# Patient Record
Sex: Female | Born: 1960
Health system: Southern US, Community
[De-identification: ages and names within clinical notes are randomized; demographics above are authoritative.]

## PROBLEM LIST (undated history)

## (undated) DIAGNOSIS — R079 Chest pain, unspecified: Secondary | ICD-10-CM

## (undated) DIAGNOSIS — R233 Spontaneous ecchymoses: Secondary | ICD-10-CM

## (undated) DIAGNOSIS — R238 Other skin changes: Secondary | ICD-10-CM

## (undated) DIAGNOSIS — I639 Cerebral infarction, unspecified: Secondary | ICD-10-CM

## (undated) DIAGNOSIS — M1990 Unspecified osteoarthritis, unspecified site: Secondary | ICD-10-CM

## (undated) DIAGNOSIS — I10 Essential (primary) hypertension: Secondary | ICD-10-CM

## (undated) DIAGNOSIS — F329 Major depressive disorder, single episode, unspecified: Secondary | ICD-10-CM

## (undated) DIAGNOSIS — R531 Weakness: Secondary | ICD-10-CM

## (undated) DIAGNOSIS — E039 Hypothyroidism, unspecified: Secondary | ICD-10-CM

## (undated) DIAGNOSIS — I2510 Atherosclerotic heart disease of native coronary artery without angina pectoris: Secondary | ICD-10-CM

## (undated) DIAGNOSIS — T7840XA Allergy, unspecified, initial encounter: Secondary | ICD-10-CM

## (undated) DIAGNOSIS — G8929 Other chronic pain: Secondary | ICD-10-CM

## (undated) DIAGNOSIS — E559 Vitamin D deficiency, unspecified: Secondary | ICD-10-CM

## (undated) DIAGNOSIS — F32A Depression, unspecified: Secondary | ICD-10-CM

## (undated) DIAGNOSIS — M549 Dorsalgia, unspecified: Secondary | ICD-10-CM

## (undated) DIAGNOSIS — M545 Low back pain, unspecified: Secondary | ICD-10-CM

## (undated) DIAGNOSIS — R0789 Other chest pain: Secondary | ICD-10-CM

## (undated) DIAGNOSIS — R51 Headache: Secondary | ICD-10-CM

## (undated) DIAGNOSIS — I4891 Unspecified atrial fibrillation: Secondary | ICD-10-CM

## (undated) DIAGNOSIS — Z8709 Personal history of other diseases of the respiratory system: Secondary | ICD-10-CM

## (undated) HISTORY — DX: Hypothyroidism, unspecified: E03.9

## (undated) HISTORY — PX: BACK SURGERY: SHX140

## (undated) HISTORY — DX: Dorsalgia, unspecified: M54.9

## (undated) HISTORY — DX: Essential (primary) hypertension: I10

## (undated) HISTORY — DX: Allergy, unspecified, initial encounter: T78.40XA

## (undated) HISTORY — PX: COLONOSCOPY: SHX174

## (undated) HISTORY — DX: Other chest pain: R07.89

## (undated) HISTORY — PX: CARPAL TUNNEL RELEASE: SHX101

## (undated) HISTORY — DX: Chest pain, unspecified: R07.9

---

## 1989-01-11 HISTORY — PX: EXPLORATORY LAPAROTOMY: SUR591

## 1997-06-04 ENCOUNTER — Ambulatory Visit (HOSPITAL_COMMUNITY): Admission: RE | Admit: 1997-06-04 | Discharge: 1997-06-04 | Payer: Self-pay | Admitting: Orthopedic Surgery

## 1998-02-03 ENCOUNTER — Ambulatory Visit (HOSPITAL_BASED_OUTPATIENT_CLINIC_OR_DEPARTMENT_OTHER): Admission: RE | Admit: 1998-02-03 | Discharge: 1998-02-03 | Payer: Self-pay | Admitting: Surgery

## 1998-03-12 ENCOUNTER — Other Ambulatory Visit: Admission: RE | Admit: 1998-03-12 | Discharge: 1998-03-12 | Payer: Self-pay | Admitting: Family Medicine

## 1998-10-02 ENCOUNTER — Encounter: Payer: Self-pay | Admitting: *Deleted

## 1998-10-02 ENCOUNTER — Ambulatory Visit (HOSPITAL_COMMUNITY): Admission: RE | Admit: 1998-10-02 | Discharge: 1998-10-02 | Payer: Self-pay | Admitting: *Deleted

## 2000-08-03 ENCOUNTER — Other Ambulatory Visit: Admission: RE | Admit: 2000-08-03 | Discharge: 2000-08-03 | Payer: Self-pay | Admitting: Gynecology

## 2001-10-30 ENCOUNTER — Other Ambulatory Visit: Admission: RE | Admit: 2001-10-30 | Discharge: 2001-10-30 | Payer: Self-pay | Admitting: Obstetrics and Gynecology

## 2003-04-01 ENCOUNTER — Other Ambulatory Visit: Admission: RE | Admit: 2003-04-01 | Discharge: 2003-04-01 | Payer: Self-pay | Admitting: Obstetrics and Gynecology

## 2005-05-05 ENCOUNTER — Encounter: Admission: RE | Admit: 2005-05-05 | Discharge: 2005-05-05 | Payer: Self-pay | Admitting: Obstetrics and Gynecology

## 2005-05-17 ENCOUNTER — Other Ambulatory Visit: Admission: RE | Admit: 2005-05-17 | Discharge: 2005-05-17 | Payer: Self-pay | Admitting: Obstetrics and Gynecology

## 2007-07-05 ENCOUNTER — Ambulatory Visit (HOSPITAL_COMMUNITY): Admission: RE | Admit: 2007-07-05 | Discharge: 2007-07-05 | Payer: Self-pay | Admitting: Obstetrics and Gynecology

## 2007-07-05 ENCOUNTER — Encounter (INDEPENDENT_AMBULATORY_CARE_PROVIDER_SITE_OTHER): Payer: Self-pay | Admitting: Obstetrics and Gynecology

## 2010-02-01 ENCOUNTER — Encounter: Payer: Self-pay | Admitting: Internal Medicine

## 2010-05-26 NOTE — H&P (Signed)
[name], [name]              ACCOUNT NO.:  [id number]   MEDICAL RECORD NO.:  [id number]           PATIENT TYPE:   LOCATION:                                 FACILITY:   PHYSICIAN:  Rebekah A. Dillard, M.D. DATE OF BIRTH:  1960-11-29   DATE OF ADMISSION:  DATE OF DISCHARGE:                              HISTORY & PHYSICAL   CHIEF COMPLAINT:  Menorrhagia.   HISTORY AND PHYSICAL:  The patient is a 50 year old gravida 1, para 1  who presented to me in June complaining of heavy vaginal bleeding,  seemed her menses were occurring every 14-20 days and lasts anywhere  from 10-14 days.  She denied having any chest pain or shortness of  breath and no bleeding disorders.  The patient is not on any  contraception.  She does have a few fibroids found on ultrasound.  She  is not on any hormone therapy.  She is on no new medications.  Denies  any menopausal symptoms or vaginal discharge.  She has no abdominal pain  or increased stress from the normal activity in her life.   PAST MEDICAL HISTORY:  Significant for chronic hypertension.   MEDICATIONS:  Include Benicar.   FAMILY HISTORY:  Negative for GYN cancer.   The patient denies any alcohol, tobacco or illicit drug use.  SHE HAS NO  KNOWN DRUG ALLERGIES.   REVIEW OF SYSTEMS:  CARDIOVASCULAR:  There is no heart palpitations.  GENITOURINARY:  Is as above.  MUSCULOSKELETAL:  No weakness.  ENDOCRINE:  There is no thyroid disease.  HEMATOLOGY:  Has no bleeding disorders.   PHYSICAL EXAM:  The patient weighs 198 pounds, her blood pressure is  122/88.  Pupils are equal.  Hearing is normal.  Throat is clear.  Thyroid is not enlarged.  HEART:  Regular rate and rhythm.  LUNGS: Clear to auscultation bilaterally.  BREASTS:  Have no masses, discharge, skin change or nipple retraction.  BACK:  Has no CVA tenderness bilaterally.  ABDOMEN:  Nontender without any masses or organomegaly.  EXTREMITIES:  No cyanosis, clubbing or edema.  NEURO:  Exam is  within normal limits.  A full vaginal exam is within normal limits.  Cervix is nontender  without any lesions.  Uterus is normal shape, size and consistency and  nontender.  Adnexa has no masses.   ASSESSMENT:  Menorrhagia.  All treatments for menorrhagia reviewed with  the patient including, but not limited to observation, hormonal  treatment, D&C, D&C without ablation, hysterectomy, myomectomy.  The  patient has decided to go with [facility] ablation.  She understands the risks  are, but not limited to, bleeding, infection, damage to internal organs  such as bowel, bladder, and major blood vessels and perforation of the  uterus.      [name], M.D.  Electronically Signed     NAD/MEDQ  D:  07/05/2007  T:  07/05/2007  Job:  [id number]

## 2010-05-26 NOTE — Op Note (Signed)
[name], [name]              ACCOUNT NO.:  [id number]   MEDICAL RECORD NO.:  [id number]          PATIENT TYPE:  AMB   LOCATION:  SDC                           FACILITY:  [facility]   PHYSICIAN:  Naima A. Dillard, M.D. DATE OF BIRTH:  Oct 30, 1960   DATE OF PROCEDURE:  07/05/2007  DATE OF DISCHARGE:                               OPERATIVE REPORT   PREOPERATIVE DIAGNOSIS:  Menorrhagia.   POSTOPERATIVE DIAGNOSES:  Menorrhagia.   PROCEDURE:  1. D&C hysteroscopy.  2. ThermaChoice ablation.   SURGEON:  Naima A. Dillard, MD.   ASSISTANT:  No assistants.   ANESTHESIA:  General, laryngeal mask airway.   FINDINGS:  Atrophic endometrium with small cavity.   SPECIMENS:  Endometrial curettings.   DISPOSITION:  The specimens sent to pathology.   ESTIMATED BLOOD LOSS:  Minimal.   COMPLICATIONS:  None.   The patient went to PACU in stable condition.   PROCEDURE DETAILS:  The patient was taken to the operating room where  she was given general anesthesia, placed in dorsal lithotomy position  and prepped and draped in a normal sterile fashion.  The catheter was  drained with an in-and-out catheter.  The patient was noted to have a 10-  week size uterus with no adnexal masses on exam.  A bivalve was placed  into the vagina.  The antrum of the cervix was grasped with single-tooth  tenaculum.  The cervix was dilated with Pratt dilators up to 21.  The  hysteroscope was placed into the uterine cavity.  It was a large clot,  which was removed with polyp forceps and once the clot was removed, I  could see both ostia.  Her cavity appeared very small.  There were no  submucosal fibroids or polyps noted.  A sharp curettage was done.  Because the cavity was small, I did not want to use NovaSure, so I used  ThermaChoice from [facility].  They have to trouble shoot and  use 3 different catheters before it worked, but it did work and we used  it per protocol.  The uterus did sound to 10 cm.   The ThermaChoice was  done per protocol without difficulty and I did look again at the  uterus after the ablation and 99% of the fundus was ablated without  difficulty.  There were no perforations.  Deficit was 100 mL.  She was  removed from the vagina.  Sponge, lap, and needle counts were correct.  Tenaculum site was hemostatic.      Naima A. Normand Sloop, M.D.  Electronically Signed     NAD/MEDQ  D:  07/05/2007  T:  07/06/2007  Job:  [id number]

## 2010-10-08 LAB — CBC
HCT: 35.9 — ABNORMAL LOW
Hemoglobin: 11.9 — ABNORMAL LOW
MCHC: 33.1
MCV: 86.8
Platelets: 349
RBC: 4.14
RDW: 15.7 — ABNORMAL HIGH
WBC: 8.1

## 2010-10-08 LAB — BASIC METABOLIC PANEL
BUN: 11
CO2: 28
Calcium: 9.2
Chloride: 103
Creatinine, Ser: 0.73
GFR calc Af Amer: >60
GFR calc non Af Amer: >60
Glucose, Bld: 86
Potassium: 4
Sodium: 138

## 2010-10-08 LAB — HCG, QUANTITATIVE, PREGNANCY: hCG, Beta Chain, Quant, S: <2

## 2011-01-14 ENCOUNTER — Other Ambulatory Visit: Payer: Self-pay | Admitting: Obstetrics and Gynecology

## 2011-01-14 DIAGNOSIS — Z1231 Encounter for screening mammogram for malignant neoplasm of breast: Secondary | ICD-10-CM

## 2011-01-21 ENCOUNTER — Ambulatory Visit
Admission: RE | Admit: 2011-01-21 | Discharge: 2011-01-21 | Disposition: A | Discharge status: Home / Self Care | Payer: Managed Care, Other (non HMO) | Source: Ambulatory Visit | Attending: Obstetrics and Gynecology | Admitting: Obstetrics and Gynecology

## 2011-01-21 DIAGNOSIS — Z1231 Encounter for screening mammogram for malignant neoplasm of breast: Secondary | ICD-10-CM

## 2011-04-19 ENCOUNTER — Other Ambulatory Visit: Payer: Self-pay

## 2011-04-19 ENCOUNTER — Encounter (HOSPITAL_COMMUNITY): Payer: Self-pay | Admitting: Emergency Medicine

## 2011-04-19 ENCOUNTER — Emergency Department (HOSPITAL_COMMUNITY)
Admission: EM | Admit: 2011-04-19 | Discharge: 2011-04-19 | Disposition: A | Discharge status: Home / Self Care | Payer: Managed Care, Other (non HMO) | Attending: Emergency Medicine | Admitting: Emergency Medicine

## 2011-04-19 ENCOUNTER — Emergency Department (HOSPITAL_COMMUNITY): Payer: Managed Care, Other (non HMO)

## 2011-04-19 DIAGNOSIS — R000 Tachycardia, unspecified: Secondary | ICD-10-CM | POA: Insufficient documentation

## 2011-04-19 DIAGNOSIS — R05 Cough: Secondary | ICD-10-CM | POA: Insufficient documentation

## 2011-04-19 DIAGNOSIS — R079 Chest pain, unspecified: Secondary | ICD-10-CM | POA: Insufficient documentation

## 2011-04-19 DIAGNOSIS — R51 Headache: Secondary | ICD-10-CM | POA: Insufficient documentation

## 2011-04-19 DIAGNOSIS — R509 Fever, unspecified: Secondary | ICD-10-CM | POA: Insufficient documentation

## 2011-04-19 DIAGNOSIS — R059 Cough, unspecified: Secondary | ICD-10-CM | POA: Insufficient documentation

## 2011-04-19 DIAGNOSIS — N390 Urinary tract infection, site not specified: Secondary | ICD-10-CM

## 2011-04-19 DIAGNOSIS — Z79899 Other long term (current) drug therapy: Secondary | ICD-10-CM | POA: Insufficient documentation

## 2011-04-19 DIAGNOSIS — R112 Nausea with vomiting, unspecified: Secondary | ICD-10-CM | POA: Insufficient documentation

## 2011-04-19 DIAGNOSIS — I10 Essential (primary) hypertension: Secondary | ICD-10-CM | POA: Insufficient documentation

## 2011-04-19 HISTORY — DX: Essential (primary) hypertension: I10

## 2011-04-19 LAB — URINALYSIS, ROUTINE W REFLEX MICROSCOPIC
Glucose, UA: NEGATIVE mg/dL
Ketones, ur: NEGATIVE mg/dL
Leukocytes, UA: NEGATIVE
Nitrite: NEGATIVE
Protein, ur: NEGATIVE mg/dL
Specific Gravity, Urine: 1.016 (ref 1.005–1.030)
Urobilinogen, UA: 1 mg/dL (ref 0.0–1.0)
pH: 6 (ref 5.0–8.0)

## 2011-04-19 LAB — URINE MICROSCOPIC-ADD ON

## 2011-04-19 LAB — BASIC METABOLIC PANEL
BUN: 10 mg/dL (ref 6–23)
CO2: 25 mEq/L (ref 19–32)
Calcium: 8.8 mg/dL (ref 8.4–10.5)
Chloride: 98 mEq/L (ref 96–112)
Creatinine, Ser: 0.76 mg/dL (ref 0.50–1.10)
GFR calc Af Amer: >90 mL/min (ref >90)
GFR calc non Af Amer: >90 mL/min (ref >90)
Glucose, Bld: 107 mg/dL — ABNORMAL HIGH (ref 70–99)
Potassium: 3.2 mEq/L — ABNORMAL LOW (ref 3.5–5.1)
Sodium: 135 mEq/L (ref 135–145)

## 2011-04-19 LAB — DIFFERENTIAL
Basophils Absolute: 0 10*3/uL (ref 0.0–0.1)
Basophils Relative: 0 % (ref 0–1)
Eosinophils Absolute: 0 10*3/uL (ref 0.0–0.7)
Eosinophils Relative: 0 % (ref 0–5)
Lymphocytes Relative: 17 % (ref 12–46)
Lymphs Abs: 1.3 10*3/uL (ref 0.7–4.0)
Monocytes Absolute: 0.5 10*3/uL (ref 0.1–1.0)
Monocytes Relative: 7 % (ref 3–12)
Neutro Abs: 5.6 10*3/uL (ref 1.7–7.7)
Neutrophils Relative %: 76 % (ref 43–77)

## 2011-04-19 LAB — CBC
HCT: 37.8 % (ref 36.0–46.0)
Hemoglobin: 12.6 g/dL (ref 12.0–15.0)
MCH: 28.8 pg (ref 26.0–34.0)
MCHC: 33.3 g/dL (ref 30.0–36.0)
MCV: 86.5 fL (ref 78.0–100.0)
Platelets: 244 10*3/uL (ref 150–400)
RBC: 4.37 MIL/uL (ref 3.87–5.11)
RDW: 13.4 % (ref 11.5–15.5)
WBC: 7.4 10*3/uL (ref 4.0–10.5)

## 2011-04-19 IMAGING — CR DG CHEST 1V PORT
1 series · 1 of 1 positions shown · non-contrast
Comparison: None.

CLINICAL DATA: Fever, cough, chest pain.

PORTABLE CHEST - 1 VIEW

[view not recorded]
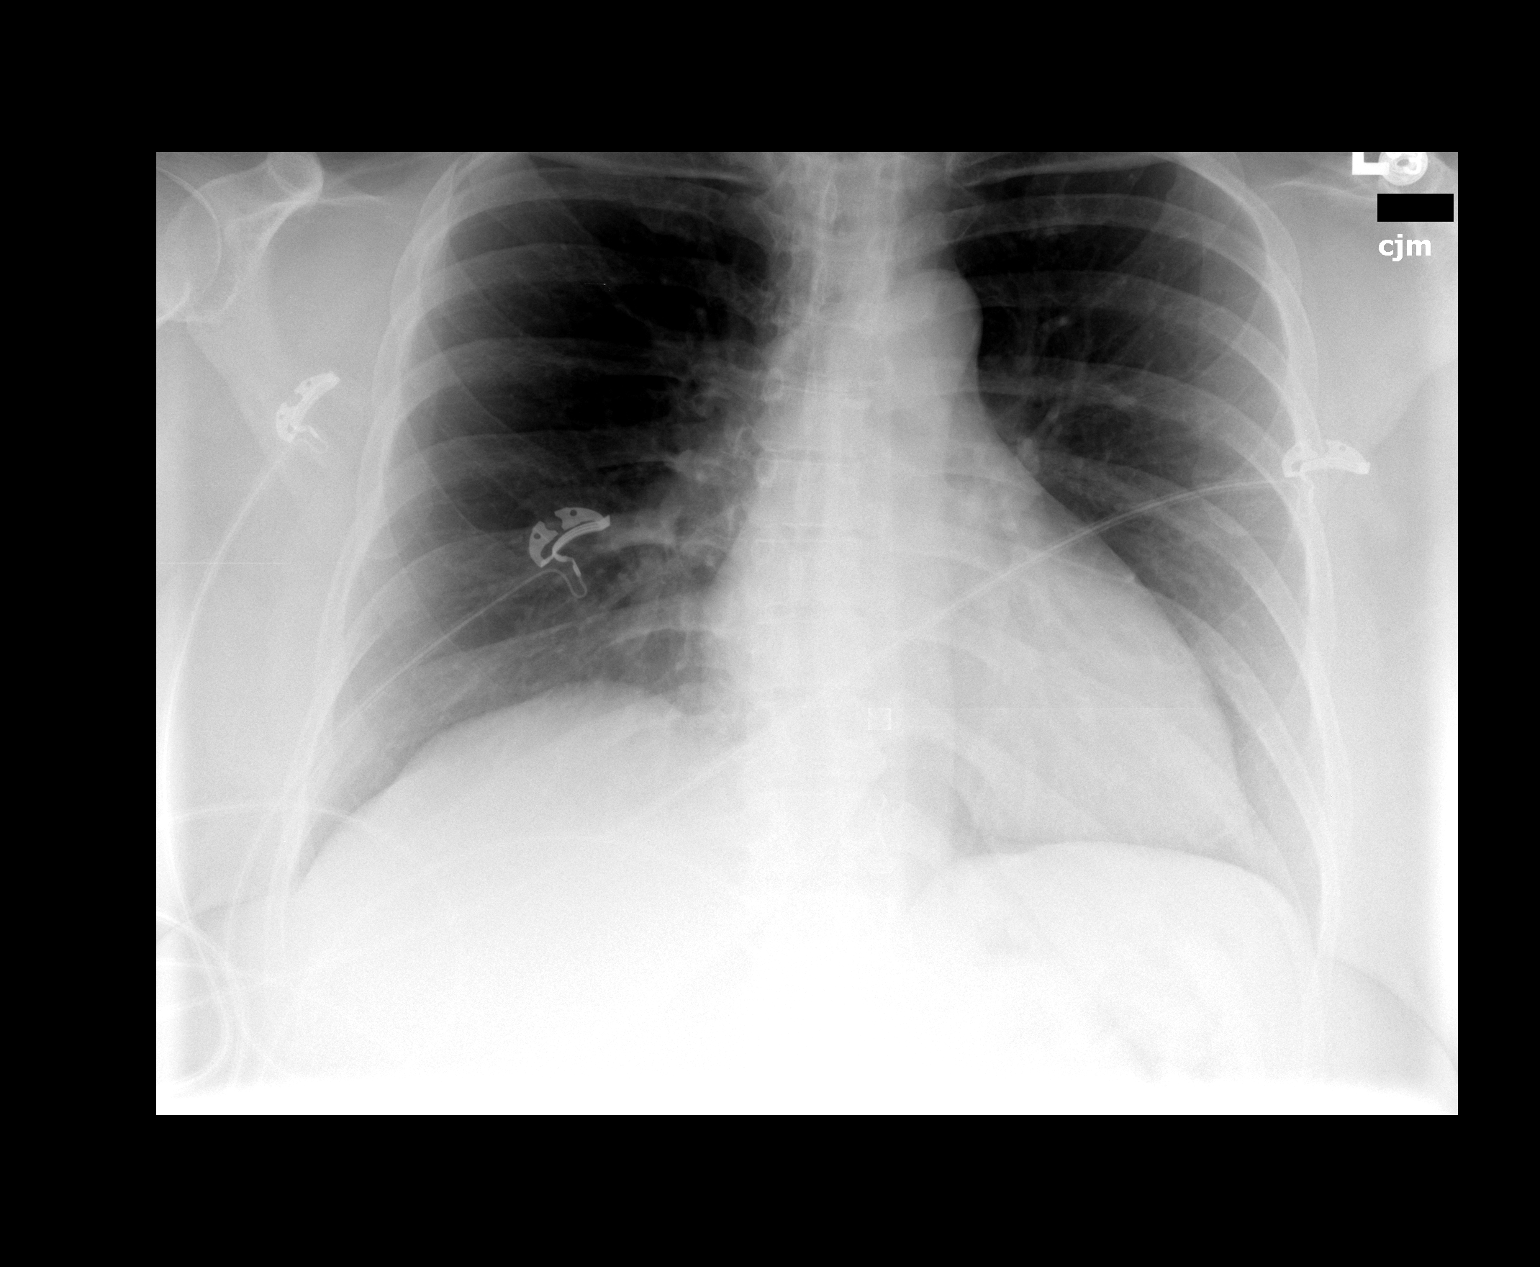

[1 of 1 positions shown; findings below may reference images not displayed]

FINDINGS: Heart is borderline in size.  Slight elevation of the
right hemidiaphragm.  Lungs are clear.  No effusions.  No acute
bony abnormality.
IMPRESSION: No active disease.

## 2011-04-19 MED ORDER — PROMETHAZINE HCL 25 MG PO TABS: 25.0000 mg | ORAL_TABLET | Freq: Four times a day (QID) | ORAL | Status: DC | PRN

## 2011-04-19 MED ORDER — SODIUM CHLORIDE 0.9 % IV SOLN
INTRAVENOUS | Status: DC
  Administered 2011-04-19: 16:00:00 via INTRAVENOUS

## 2011-04-19 MED ORDER — ONDANSETRON HCL 4 MG/2ML IJ SOLN
4.0000 mg | Freq: Once | INTRAMUSCULAR | Status: AC
  Administered 2011-04-19: 4 mg via INTRAVENOUS
  Filled 2011-04-19: qty 2

## 2011-04-19 MED ORDER — CIPROFLOXACIN HCL 500 MG PO TABS: 500.0000 mg | ORAL_TABLET | Freq: Two times a day (BID) | ORAL | Status: AC

## 2011-04-19 MED ORDER — CIPROFLOXACIN HCL 500 MG PO TABS
500.0000 mg | ORAL_TABLET | Freq: Once | ORAL | Status: AC
  Administered 2011-04-19: 500 mg via ORAL
  Filled 2011-04-19: qty 1

## 2011-04-19 MED ORDER — ACETAMINOPHEN 325 MG PO TABS
650.0000 mg | ORAL_TABLET | Freq: Once | ORAL | Status: AC
  Administered 2011-04-19: 650 mg via ORAL
  Filled 2011-04-19: qty 2

## 2011-04-19 MED ORDER — OXYCODONE-ACETAMINOPHEN 5-325 MG PO TABS: 2.0000 | ORAL_TABLET | ORAL | Status: AC | PRN

## 2011-04-19 MED ORDER — MORPHINE SULFATE 4 MG/ML IJ SOLN
4.0000 mg | Freq: Once | INTRAMUSCULAR | Status: AC
  Administered 2011-04-19: 4 mg via INTRAVENOUS
  Filled 2011-04-19: qty 1

## 2011-04-19 NOTE — ED Notes (Signed)
Patient can not get an urine at this time will try later 

## 2011-04-19 NOTE — ED Provider Notes (Signed)
History     CSN: [id number]  Arrival date & time 04/19/11  1514   First MD Initiated Contact with Patient 04/19/11 1535      Chief Complaint  Patient presents with  . Chest Pain    (Consider location/radiation/quality/duration/timing/severity/associated sxs/prior treatment) The history is provided by the patient.   patient is a 51 year old, female, with a history of hypertension.  She says last night.  She developed nausea and vomiting.  She went to sleep and then today, she developed left-sided chest pain.  She's had a nonproductive cough, and fever.  She also has a headache.  She denies diarrhea.  She denies urinary tract symptoms.  She is not short of breath.  She has not taken anything for her fever.  Past Medical History  Diagnosis Date  . Hypertension     Past Surgical History  Procedure Date  . C-section   . Carpal tunnel release     No family history on file.  History  Substance Use Topics  . Smoking status: Not on file  . Smokeless tobacco: Not on file  . Alcohol Use: 0.0 oz/week    1-3 Cans of beer per week    OB History    Grav Para Term Preterm Abortions TAB SAB Ect Mult Living                  Review of Systems  Constitutional: Positive for fever. Negative for chills and diaphoresis.  Eyes: Negative for visual disturbance.  Respiratory: Positive for cough. Negative for chest tightness and shortness of breath.   Cardiovascular: Positive for chest pain.  Gastrointestinal: Positive for nausea and vomiting. Negative for abdominal pain and diarrhea.  Genitourinary: Negative for dysuria.  Skin: Negative for rash.  Neurological: Positive for headaches.  Psychiatric/Behavioral: Negative for confusion.  All other systems reviewed and are negative.    Allergies  Review of patient's allergies indicates no known allergies.  Home Medications   Current Outpatient Rx  Name Route Sig Dispense Refill  . OLMESARTAN MEDOXOMIL-HCTZ 40-12.5 MG PO TABS Oral  Take 1 tablet by mouth daily.      BP 180/150  Pulse 107  Temp(Src) 101.8 F (38.8 C) (Oral)  Resp 20  SpO2 98%  Physical Exam  Vitals reviewed. Constitutional: She is oriented to person, place, and time. No distress.       Morbidly obese  HENT:  Head: Normocephalic and atraumatic.  Eyes: Conjunctivae and EOM are normal.  Neck: Normal range of motion. Neck supple.       No nuchal rigidity  Cardiovascular:  No murmur heard.      Tachycardia  Pulmonary/Chest: Effort normal. She has no wheezes. She has no rales.  Abdominal: Soft. Bowel sounds are normal. She exhibits no distension. There is no tenderness.  Musculoskeletal: Normal range of motion.  Neurological: She is alert and oriented to person, place, and time.  Skin: Skin is warm and dry. No rash noted. No erythema.  Psychiatric: She has a normal mood and affect. Judgment and thought content normal.    ED Course  Procedures (including critical care time) The patient is a 51 year old, female, who has left-sided chest pain, nonproductive cough, fever, nausea, and vomiting.  We will establish an IV and treat her symptoms.  I will perform a chest x-ray, and laboratory testing, to look for the etiology of her symptoms.  She has a headache, and fever.  She does not have any nuchal rigidity.  She does not have any  rash or not.  Think she has meningitis and a lumbar puncture is not indicated   Labs Reviewed  CBC  DIFFERENTIAL  BASIC METABOLIC PANEL  URINALYSIS, ROUTINE W REFLEX MICROSCOPIC   No results found.   No diagnosis found.  sxs resolved   MDM  Fever with some evidence of uti. sxs resolved.  Not toxic. No pneumonia.   No evidence of CNS infection         Cheri Guppy, MD 04/19/11 2002

## 2011-04-19 NOTE — Discharge Instructions (Signed)
Your chest x-ray does not show signs of pneumonia.  There is evidence that she might have urinary tract infection.  Use Cipro for infection and Percocet for pain and Phenergan for nausea and vomiting.  Followup with your Dr. if your symptoms.  Last more than 3-4 days after taking the antibiotic.  Return for worse or uncontrolled symptoms

## 2011-04-19 NOTE — ED Notes (Signed)
Pt reports intermittent CP starting last night with n/v and sob, worse today. Denies radiation. Also headache starting this am. Also states whole body was hurting during the night and neck felt stiff.

## 2011-04-21 LAB — URINE CULTURE
Colony Count: NO GROWTH
Culture  Setup Time: 201304090814
Culture: NO GROWTH

## 2011-06-10 ENCOUNTER — Other Ambulatory Visit: Payer: Self-pay | Admitting: Occupational Medicine

## 2011-06-10 ENCOUNTER — Ambulatory Visit: Payer: Self-pay

## 2011-06-10 DIAGNOSIS — M549 Dorsalgia, unspecified: Secondary | ICD-10-CM

## 2011-06-10 IMAGING — CR DG LUMBAR SPINE COMPLETE 4+V
6 series · 6 of 6 positions shown · non-contrast
Comparison: None.

CLINICAL DATA: Lifting injury.  Persistent low back pain, now
extending into the right lower extremity.

LUMBAR SPINE - COMPLETE 4+ VIEW

[view not recorded (1 of 6)]
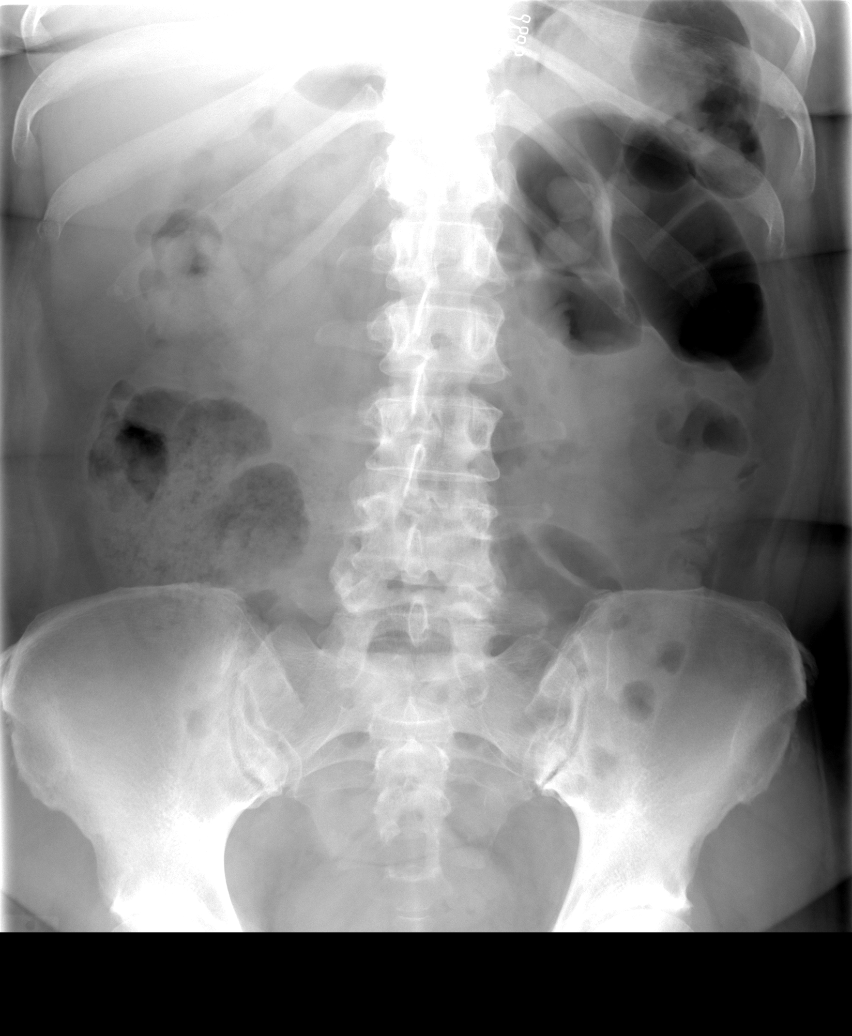

[view not recorded (2 of 6)]
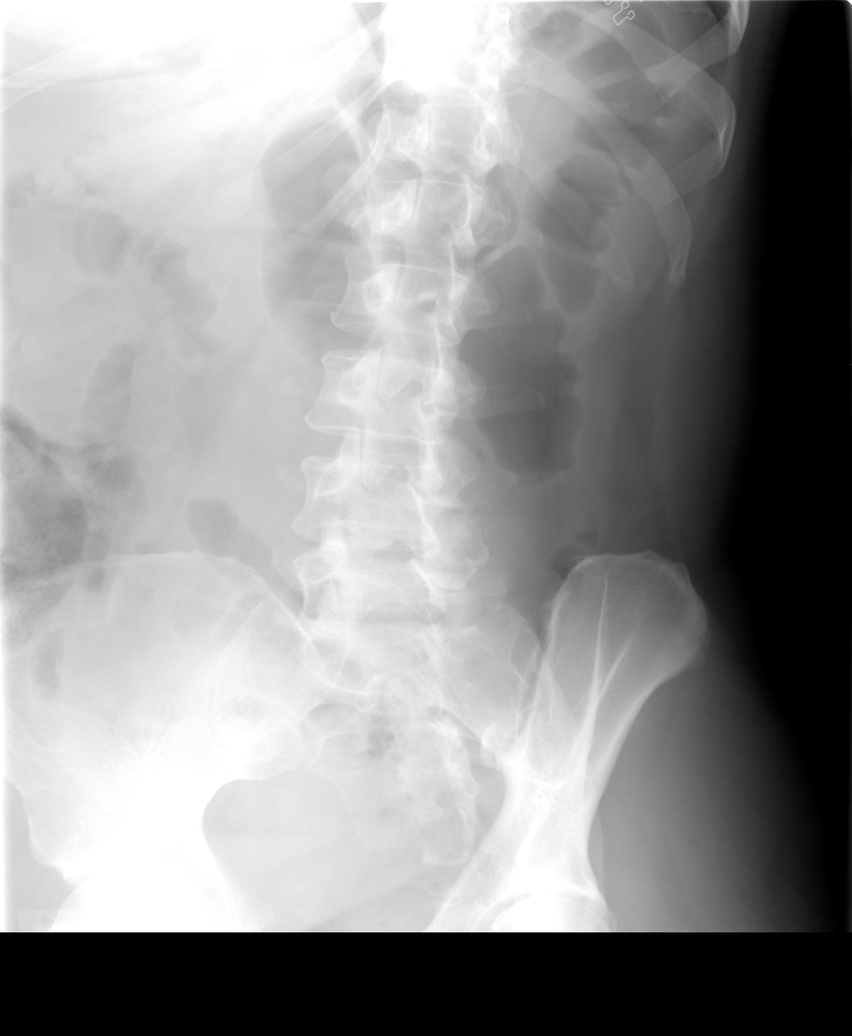

[view not recorded (3 of 6)]
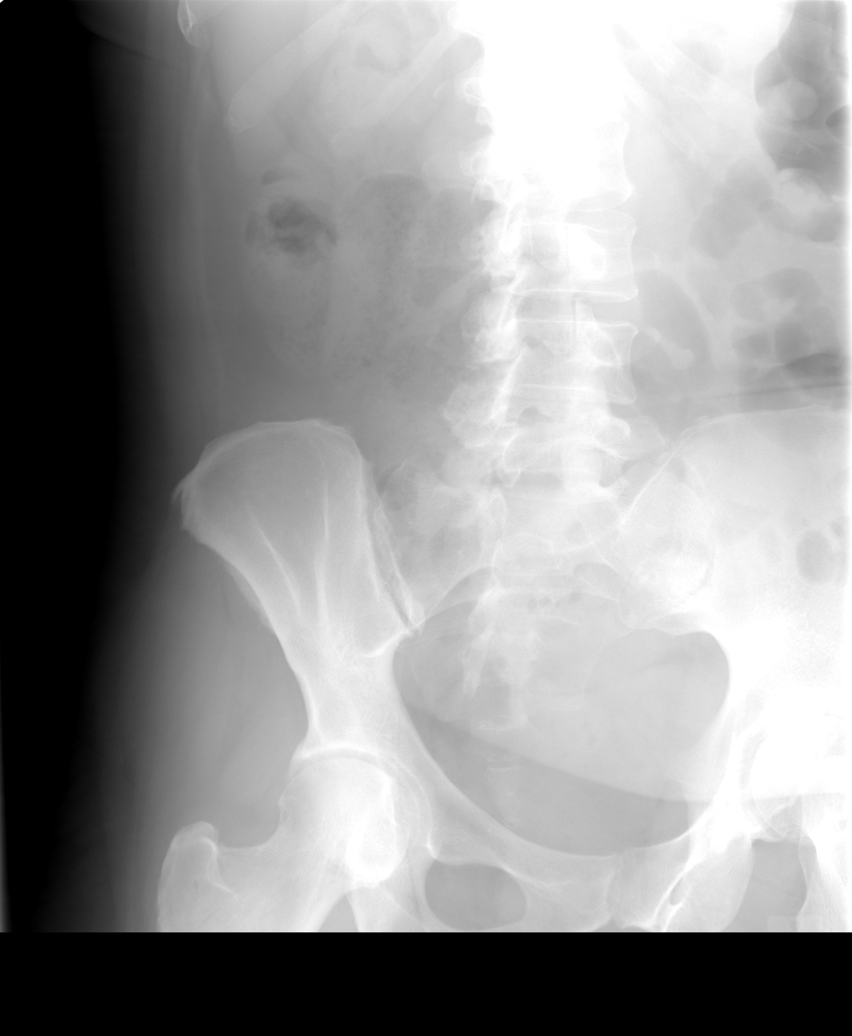

[view not recorded (4 of 6)]
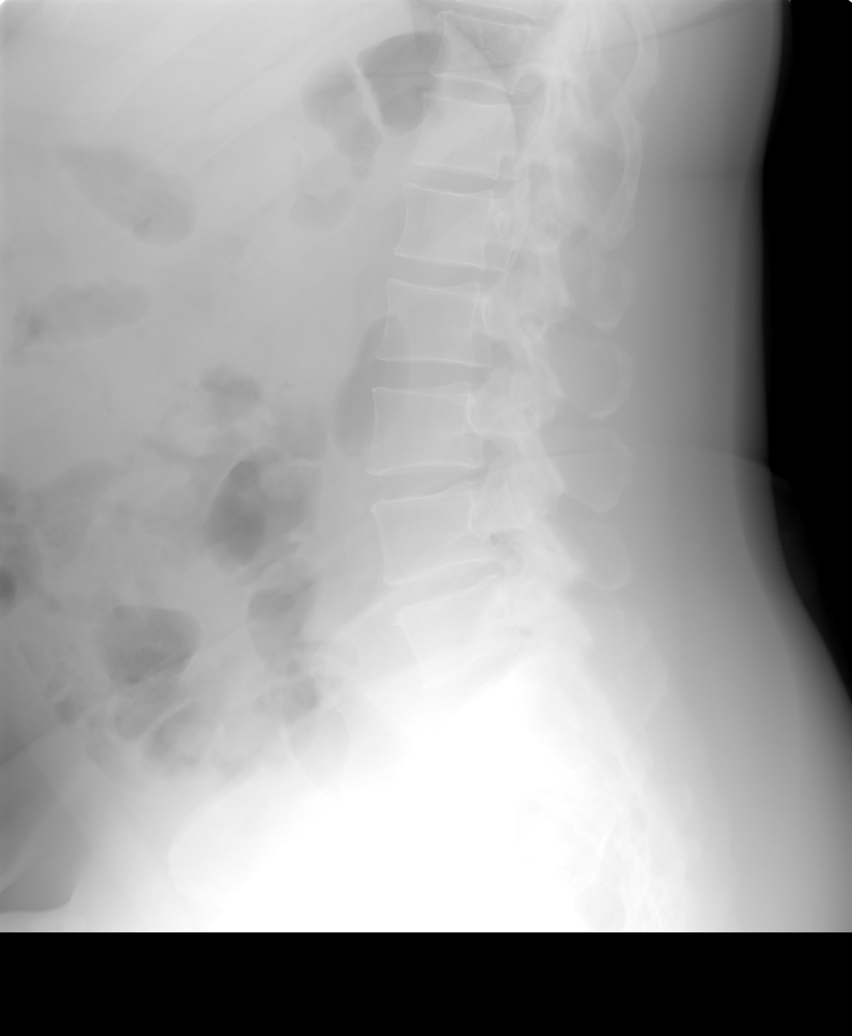

[view not recorded (5 of 6)]
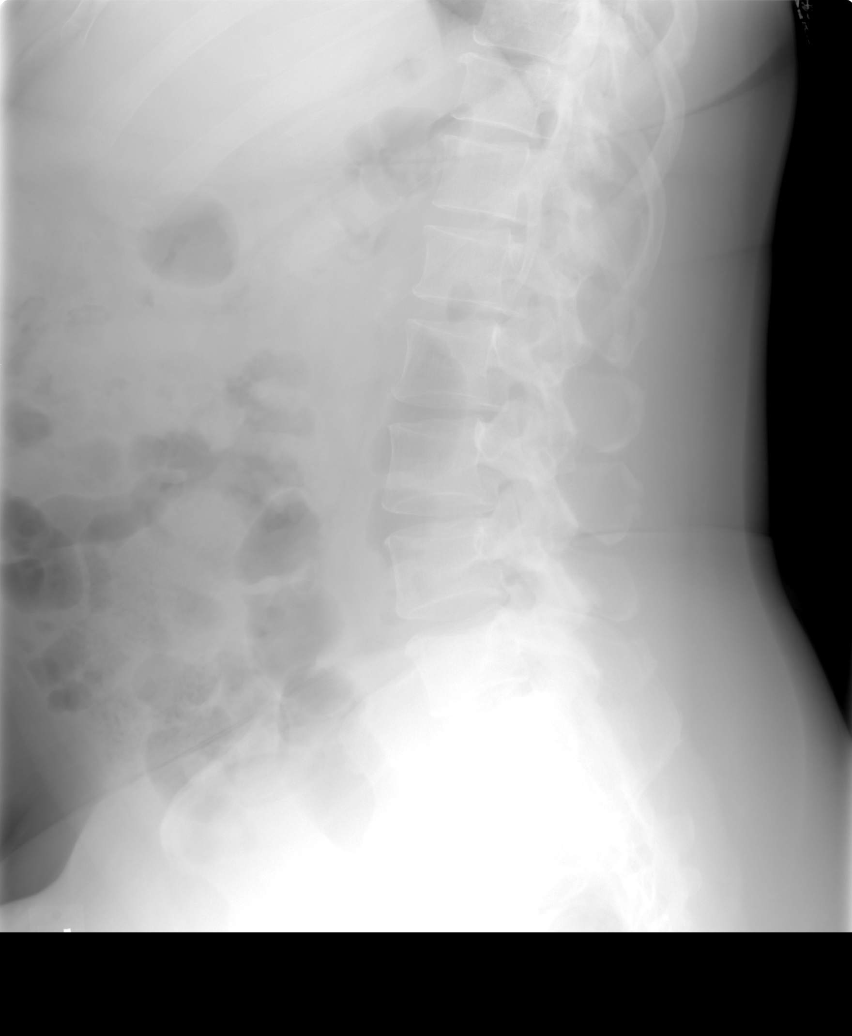

[view not recorded (6 of 6)]
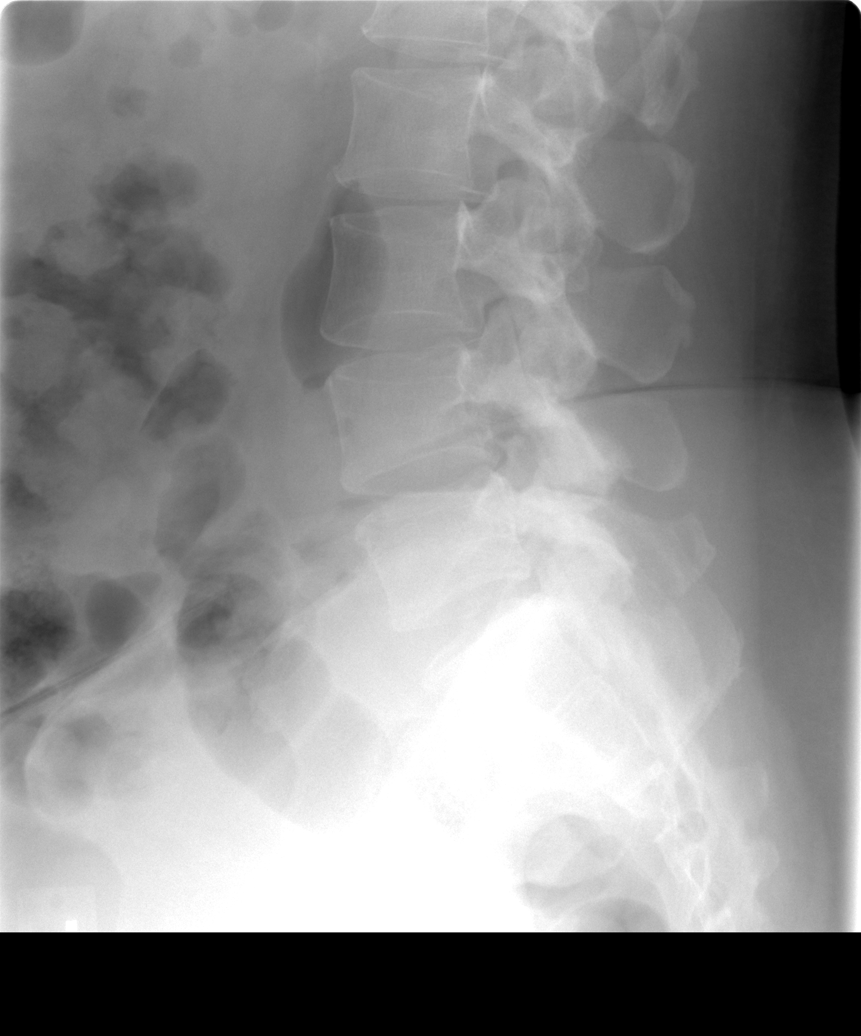

[6 of 6 positions shown; findings below may reference images not displayed]

FINDINGS: Five non-rib bearing lumbar type vertebral bodies are
present.  There is slight leftward curvature lumbar spine centered
at L3.  Mild facet degenerative changes are evident at L4-5 and L5-
S1.  There is some loss of disc height at L4-5 and L5-S1.  The
vertebral body heights and alignment are maintained.  The soft
tissues are unremarkable.
IMPRESSION: 1.  Mild spondylosis at L4-5 and L5-S1.  Disc disease and spinal
stenosis may be present.
2.  Mild leftward curvature of the mid lumbar spine.

## 2012-02-15 ENCOUNTER — Encounter: Payer: Self-pay | Admitting: Obstetrics and Gynecology

## 2012-02-24 ENCOUNTER — Encounter: Payer: Self-pay | Admitting: Obstetrics and Gynecology

## 2012-04-21 ENCOUNTER — Other Ambulatory Visit: Payer: Self-pay | Admitting: Nurse Practitioner

## 2012-04-21 DIAGNOSIS — H05251 Intermittent exophthalmos, right eye: Secondary | ICD-10-CM

## 2012-04-26 ENCOUNTER — Other Ambulatory Visit: Payer: Self-pay

## 2012-04-27 ENCOUNTER — Other Ambulatory Visit: Payer: Self-pay

## 2012-05-09 ENCOUNTER — Encounter: Payer: Self-pay | Admitting: Gastroenterology

## 2012-05-30 ENCOUNTER — Ambulatory Visit (AMBULATORY_SURGERY_CENTER): Payer: BC Managed Care – PPO

## 2012-05-30 VITALS — Ht 61.5 in | Wt 200.0 lb

## 2012-05-30 DIAGNOSIS — Z1211 Encounter for screening for malignant neoplasm of colon: Secondary | ICD-10-CM

## 2012-05-30 MED ORDER — MOVIPREP 100 G PO SOLR: ORAL | Status: DC

## 2012-05-31 ENCOUNTER — Encounter: Payer: Self-pay | Admitting: Gastroenterology

## 2012-06-07 ENCOUNTER — Other Ambulatory Visit: Payer: Self-pay | Admitting: Obstetrics and Gynecology

## 2012-06-13 ENCOUNTER — Ambulatory Visit (AMBULATORY_SURGERY_CENTER): Payer: BC Managed Care – PPO | Admitting: Gastroenterology

## 2012-06-13 ENCOUNTER — Encounter: Payer: Self-pay | Admitting: Gastroenterology

## 2012-06-13 VITALS — BP 137/91 | HR 70 | Temp 98.2°F | Resp 17 | Ht 61.5 in | Wt 200.0 lb

## 2012-06-13 DIAGNOSIS — Z1211 Encounter for screening for malignant neoplasm of colon: Secondary | ICD-10-CM

## 2012-06-13 MED ORDER — SODIUM CHLORIDE 0.9 % IV SOLN: 500.0000 mL | INTRAVENOUS | Status: DC

## 2012-06-13 NOTE — Op Note (Addendum)
[facility] [address] [place], [address]   COLONOSCOPY PROCEDURE REPORT  PATIENT: Rebekah, Wagner  MR#: [id number] BIRTHDATE: 12/05/1960 , 52  yrs. old GENDER: Female ENDOSCOPIST: Meryl Dare, MD, [facility] REFERRED NF:AOZHY Allyne Gee, M.D. PROCEDURE DATE:  06/13/2012 PROCEDURE:   Colonoscopy, screening ASA CLASS:   Class II INDICATIONS:average risk screening, first colonoscopy. MEDICATIONS: MAC sedation, administered by CRNA and propofol (Diprivan) 300mg  IV DESCRIPTION OF PROCEDURE:   After the risks benefits and alternatives of the procedure were thoroughly explained, informed consent was obtained.  A digital rectal exam revealed no abnormalities of the rectum.   The LB [id number]  endoscope was introduced through the anus and advanced to the cecum, which was identified by both the appendix and ileocecal valve. No adverse events experienced.   The quality of the prep was excellent, using MoviPrep  The instrument was then slowly withdrawn as the colon was fully examined.  COLON FINDINGS: A normal appearing cecum, ileocecal valve, and appendiceal orifice were identified.  The ascending, hepatic flexure, transverse, splenic flexure, descending, sigmoid colon and rectum appeared unremarkable.  No polyps or cancers were seen. Retroflexed views revealed no abnormalities. The time to cecum=3 minutes 10 seconds.  Withdrawal time=9 minutes 52 seconds.  The scope was withdrawn and the procedure completed.  COMPLICATIONS: There were no complications.  ENDOSCOPIC IMPRESSION: 1.  Normal colon  RECOMMENDATIONS: 1.  Follow colorectal cancer screening guidelines for "routine risk" patients with a repeat colonoscopy in 10 years.  There is no need for routine screening FOBT (stool) testing for at least 5 years.   eSigned:  Meryl Dare, MD, [facility] 06/13/2012 9:29 AM Revised: 06/13/2012 9:29 AM  :

## 2012-06-13 NOTE — Patient Instructions (Addendum)
YOU HAD AN ENDOSCOPIC PROCEDURE TODAY AT THE [facility]: Refer to the procedure report that was given to you for any specific questions about what was found during the examination.  If the procedure report does not answer your questions, please call your gastroenterologist to clarify.  If you requested that your care partner not be given the details of your procedure findings, then the procedure report has been included in a sealed envelope for you to review at your convenience later.  YOU SHOULD EXPECT: Some feelings of bloating in the abdomen. Passage of more gas than usual.  Walking can help get rid of the air that was put into your GI tract during the procedure and reduce the bloating. If you had a lower endoscopy (such as a colonoscopy or flexible sigmoidoscopy) you may notice spotting of blood in your stool or on the toilet paper. If you underwent a bowel prep for your procedure, then you may not have a normal bowel movement for a few days.  DIET: Your first meal following the procedure should be a light meal and then it is ok to progress to your normal diet.  A half-sandwich or bowl of soup is an example of a good first meal.  Heavy or fried foods are harder to digest and may make you feel nauseous or bloated.  Likewise meals heavy in dairy and vegetables can cause extra gas to form and this can also increase the bloating.  Drink plenty of fluids but you should avoid alcoholic beverages for 24 hours.  ACTIVITY: Your care partner should take you home directly after the procedure.  You should plan to take it easy, moving slowly for the rest of the day.  You can resume normal activity the day after the procedure however you should NOT DRIVE or use heavy machinery for 24 hours (because of the sedation medicines used during the test).    SYMPTOMS TO REPORT IMMEDIATELY: A gastroenterologist can be reached at any hour.  During normal business hours, 8:30 AM to 5:00 PM Monday through Friday,  call [phone].  After hours and on weekends, please call the GI answering service at [phone] who will take a message and have the physician on call contact you.   Following lower endoscopy (colonoscopy or flexible sigmoidoscopy):  Excessive amounts of blood in the stool  Significant tenderness or worsening of abdominal pains  Swelling of the abdomen that is new, acute  Fever of 100F or higher  FOLLOW UP: If any biopsies were taken you will be contacted by phone or by letter within the next 1-3 weeks.  Call your gastroenterologist if you have not heard about the biopsies in 3 weeks.  Our staff will call the home number listed on your records the next business day following your procedure to check on you and address any questions or concerns that you may have at that time regarding the information given to you following your procedure. This is a courtesy call and so if there is no answer at the home number and we have not heard from you through the emergency physician on call, we will assume that you have returned to your regular daily activities without incident.  SIGNATURES/CONFIDENTIALITY: You and/or your care partner have signed paperwork which will be entered into your electronic medical record.  These signatures attest to the fact that that the information above on your After Visit Summary has been reviewed and is understood.  Full responsibility of the confidentiality of this   discharge information lies with you and/or your care-partner.  Repeat colonoscopy in 10 years.  

## 2012-06-13 NOTE — Progress Notes (Signed)
Patient did not experience any of the following events: a burn prior to discharge; a fall within the facility; wrong site/side/patient/procedure/implant event; or a hospital transfer or hospital admission upon discharge from the facility. (G8907) Patient did not have preoperative order for IV antibiotic SSI prophylaxis. (G8918)  

## 2012-06-14 ENCOUNTER — Telehealth: Payer: Self-pay

## 2012-06-14 NOTE — Telephone Encounter (Signed)
  Follow up Call-  Call back number 06/13/2012  Post procedure Call Back phone  # [phone]  Permission to leave phone message Yes     Patient questions:  Do you have a fever, pain , or abdominal swelling? no Pain Score  0 *  Have you tolerated food without any problems? yes  Have you been able to return to your normal activities? yes  Do you have any questions about your discharge instructions: Diet   no Medications  no Follow up visit  no  Do you have questions or concerns about your Care? no  Actions: * If pain score is 4 or above: No action needed, pain <4.

## 2012-10-06 NOTE — H&P (Signed)
History of Present Illness  The patient is a 52 year old female who presents today for follow up of their back. The patient is being followed for their left-sided back pain (and is here to discuss her MRI scan) and osteoarthritis. They are now 1 year(s) (3.5 months) out from injury (05/28/11 moving copier paper at work). Symptoms reported today include: pain (in the left leg from the groin to the plantar left heel that has not changed since the injury), pain at night, popping and pain with lying. The patient states that they are doing poorly. Current treatment includes: NSAIDs (Ibuprofen 800mg  1 po bid/tid prn). The patient reports their current pain level to be moderate to severe. The patient indicates that they have questions or concerns today regarding their progress at this point and possible surgery. Note for "Follow-up back": WC never approved the injection that Dr. Shon Baton prescribed last visit so she did not have it    Subjective Transcription  She returns today for follow up. At this point in time she continues to have significant radicular right leg pain. She notes that with inactivity and just kind of walking most of her pain is on the left side because she favors it so much. Whenever she tries to do either the physical therapy or her self directed exercises she gets horrific radicular right leg pain. This has now been going on for a year. At this point despite the selective nerve root blocks and the medications, the physical therapy, self directed exercises, modified activities she still has continuing decreased quality of life and pain.    Allergies No Known Drug Allergies. 06/24/2011   Social History Alcohol use. current drinker; drinks beer; 8-14 per week Children. 1 Current work status. working full time Drug/Alcohol Rehab (Currently). no Drug/Alcohol Rehab (Previously). no Exercise. Exercises rarely; does running / walking Illicit drug use. no Living  situation. live alone Marital status. divorced Number of flights of stairs before winded. 4-5 Tobacco / smoke exposure. no Tobacco use. never smoker   Medication History Ibuprofen (800MG  Tablet, Oral) Active. Benicar (20MG  Tablet, Oral) Active. (1 qd) Hyzaar ( Oral as needed) Specific dose unknown - Active. (1 qd) Medications Reconciled.   Vitals 09/15/2012 10:08 AM Weight: 204.5 lb Height: 61.5 in Body Surface Area: 2.01 m Body Mass Index: 38.01 kg/m BP: 150/80 (Sitting, Left Arm, Standard) pt aware of bp, did not take bp med today  Physical exam: A+O X3 Significant right leg pain with ROM Numbess in L4/L3 distribution No sob/cp abd soft/nt Back pain with palpation and ROM     Assessment & Plan Lumbar strain (847.2) Current Plans l Risks of surgery include, but are not limited to: Death, stroke, paralysis, nerve root damage/injury, bleeding, blood clots, loss of bowel/bladder control, sexual dysfunction, retrograde ejaculation, hardware failure, or malposition, spinal fluid leak, adjacent segment disease, non-union, need for further surgery, ongoing or worse pain, injury to bladder, bowel and abdominal contents, infection and recurrent disc herniation    Plans Transcription  We have discussed doing a right L3-4 extraforaminal discectomy. I think this is the best choice of option for her surgery. The risks include infection, bleeding, nerve damage, death, stroke, paralysis, blood clots, recurrent disc herniation, injury to the dorsal root ganglion producing horrific sympathetic reflex dystrophy pain. I have gone over with a model the surgical procedure which would be a far lateral discectomy through Wiltse approach at L3-4. She has expressed an understanding of the risks and benefits. She is present for the dictation.  We will proceed in a timely fashion.

## 2012-10-23 ENCOUNTER — Other Ambulatory Visit (HOSPITAL_COMMUNITY): Payer: Self-pay

## 2012-10-24 ENCOUNTER — Other Ambulatory Visit (HOSPITAL_COMMUNITY): Payer: Self-pay

## 2012-10-26 ENCOUNTER — Encounter (HOSPITAL_COMMUNITY): Payer: Self-pay | Admitting: Pharmacy Technician

## 2012-10-27 NOTE — Pre-Procedure Instructions (Signed)
AMORI COOPERMAN  10/27/2012   Your procedure is scheduled on: Thurs, Oct 30 @ 7:30 AM  Report to [facility] Stay [facility]trance A at 5:30 AM.  Call this number if you have problems the morning of surgery: [phone]   Remember:   Do not eat food or drink liquids after midnight.   Take these medicines the morning of surgery with A SIP OF WATER: Pain Pill(if needed)              Stop taking your Ibuprofen.No Goody's,BC's,Aleve,Aspirin,Fish Oil,or any Herbal Medications   Do not wear jewelry, make-up or nail polish.  Do not wear lotions, powders, or perfumes. You may wear deodorant.  Do not shave 48 hours prior to surgery.   Do not bring valuables to the hospital.  [facility] is not responsible                  for any belongings or valuables.               Contacts, dentures or bridgework may not be worn into surgery.  Leave suitcase in the car. After surgery it may be brought to your room.  For patients admitted to the hospital, discharge time is determined by your                treatment team.               Patients discharged the day of surgery will not be allowed to drive  home.    Special Instructions: Shower using CHG 2 nights before surgery and the night before surgery.  If you shower the day of surgery use CHG.  Use special wash - you have one bottle of CHG for all showers.  You should use approximately 1/3 of the bottle for each shower.   Please read over the following fact sheets that you were given: Pain Booklet, Coughing and Deep Breathing, MRSA Information and Surgical Site Infection Prevention

## 2012-10-30 ENCOUNTER — Encounter (HOSPITAL_COMMUNITY): Payer: Self-pay

## 2012-10-30 ENCOUNTER — Encounter (HOSPITAL_COMMUNITY)
Admission: RE | Admit: 2012-10-30 | Discharge: 2012-10-30 | Disposition: A | Discharge status: Home / Self Care | Payer: Worker's Compensation | Source: Ambulatory Visit | Attending: Orthopedic Surgery | Admitting: Orthopedic Surgery

## 2012-10-30 ENCOUNTER — Encounter (HOSPITAL_COMMUNITY)
Admission: RE | Admit: 2012-10-30 | Discharge: 2012-10-30 | Disposition: A | Discharge status: Home / Self Care | Payer: Worker's Compensation | Source: Ambulatory Visit | Attending: Anesthesiology | Admitting: Anesthesiology

## 2012-10-30 DIAGNOSIS — Z01812 Encounter for preprocedural laboratory examination: Secondary | ICD-10-CM | POA: Insufficient documentation

## 2012-10-30 DIAGNOSIS — Z01818 Encounter for other preprocedural examination: Secondary | ICD-10-CM | POA: Insufficient documentation

## 2012-10-30 HISTORY — DX: Depression, unspecified: F32.A

## 2012-10-30 HISTORY — DX: Spontaneous ecchymoses: R23.3

## 2012-10-30 HISTORY — DX: Unspecified osteoarthritis, unspecified site: M19.90

## 2012-10-30 HISTORY — DX: Headache: R51

## 2012-10-30 HISTORY — DX: Weakness: R53.1

## 2012-10-30 HISTORY — DX: Vitamin D deficiency, unspecified: E55.9

## 2012-10-30 HISTORY — DX: Major depressive disorder, single episode, unspecified: F32.9

## 2012-10-30 HISTORY — DX: Personal history of other diseases of the respiratory system: Z87.09

## 2012-10-30 HISTORY — DX: Other skin changes: R23.8

## 2012-10-30 LAB — BASIC METABOLIC PANEL
BUN: 11 mg/dL (ref 6–23)
CO2: 28 mEq/L (ref 19–32)
Calcium: 9.4 mg/dL (ref 8.4–10.5)
Chloride: 101 mEq/L (ref 96–112)
Creatinine, Ser: 0.82 mg/dL (ref 0.50–1.10)
GFR calc Af Amer: >90 mL/min (ref >90)
GFR calc non Af Amer: 81 mL/min — ABNORMAL LOW (ref >90)
Glucose, Bld: 95 mg/dL (ref 70–99)
Potassium: 3.8 mEq/L (ref 3.5–5.1)
Sodium: 138 mEq/L (ref 135–145)

## 2012-10-30 LAB — SURGICAL PCR SCREEN
MRSA, PCR: NEGATIVE
Staphylococcus aureus: NEGATIVE

## 2012-10-30 LAB — CBC
HCT: 37.7 % (ref 36.0–46.0)
Hemoglobin: 12.3 g/dL (ref 12.0–15.0)
MCH: 29.2 pg (ref 26.0–34.0)
MCHC: 32.6 g/dL (ref 30.0–36.0)
MCV: 89.5 fL (ref 78.0–100.0)
Platelets: 279 10*3/uL (ref 150–400)
RBC: 4.21 MIL/uL (ref 3.87–5.11)
RDW: 13.8 % (ref 11.5–15.5)
WBC: 9 10*3/uL (ref 4.0–10.5)

## 2012-10-30 IMAGING — CR DG CHEST 2V
2 series · 2 of 2 positions shown · non-contrast
Comparison: [DATE]

CLINICAL DATA: Pre-admission, hypertension

EXAM:
CHEST  2 VIEW

[w chest pa]
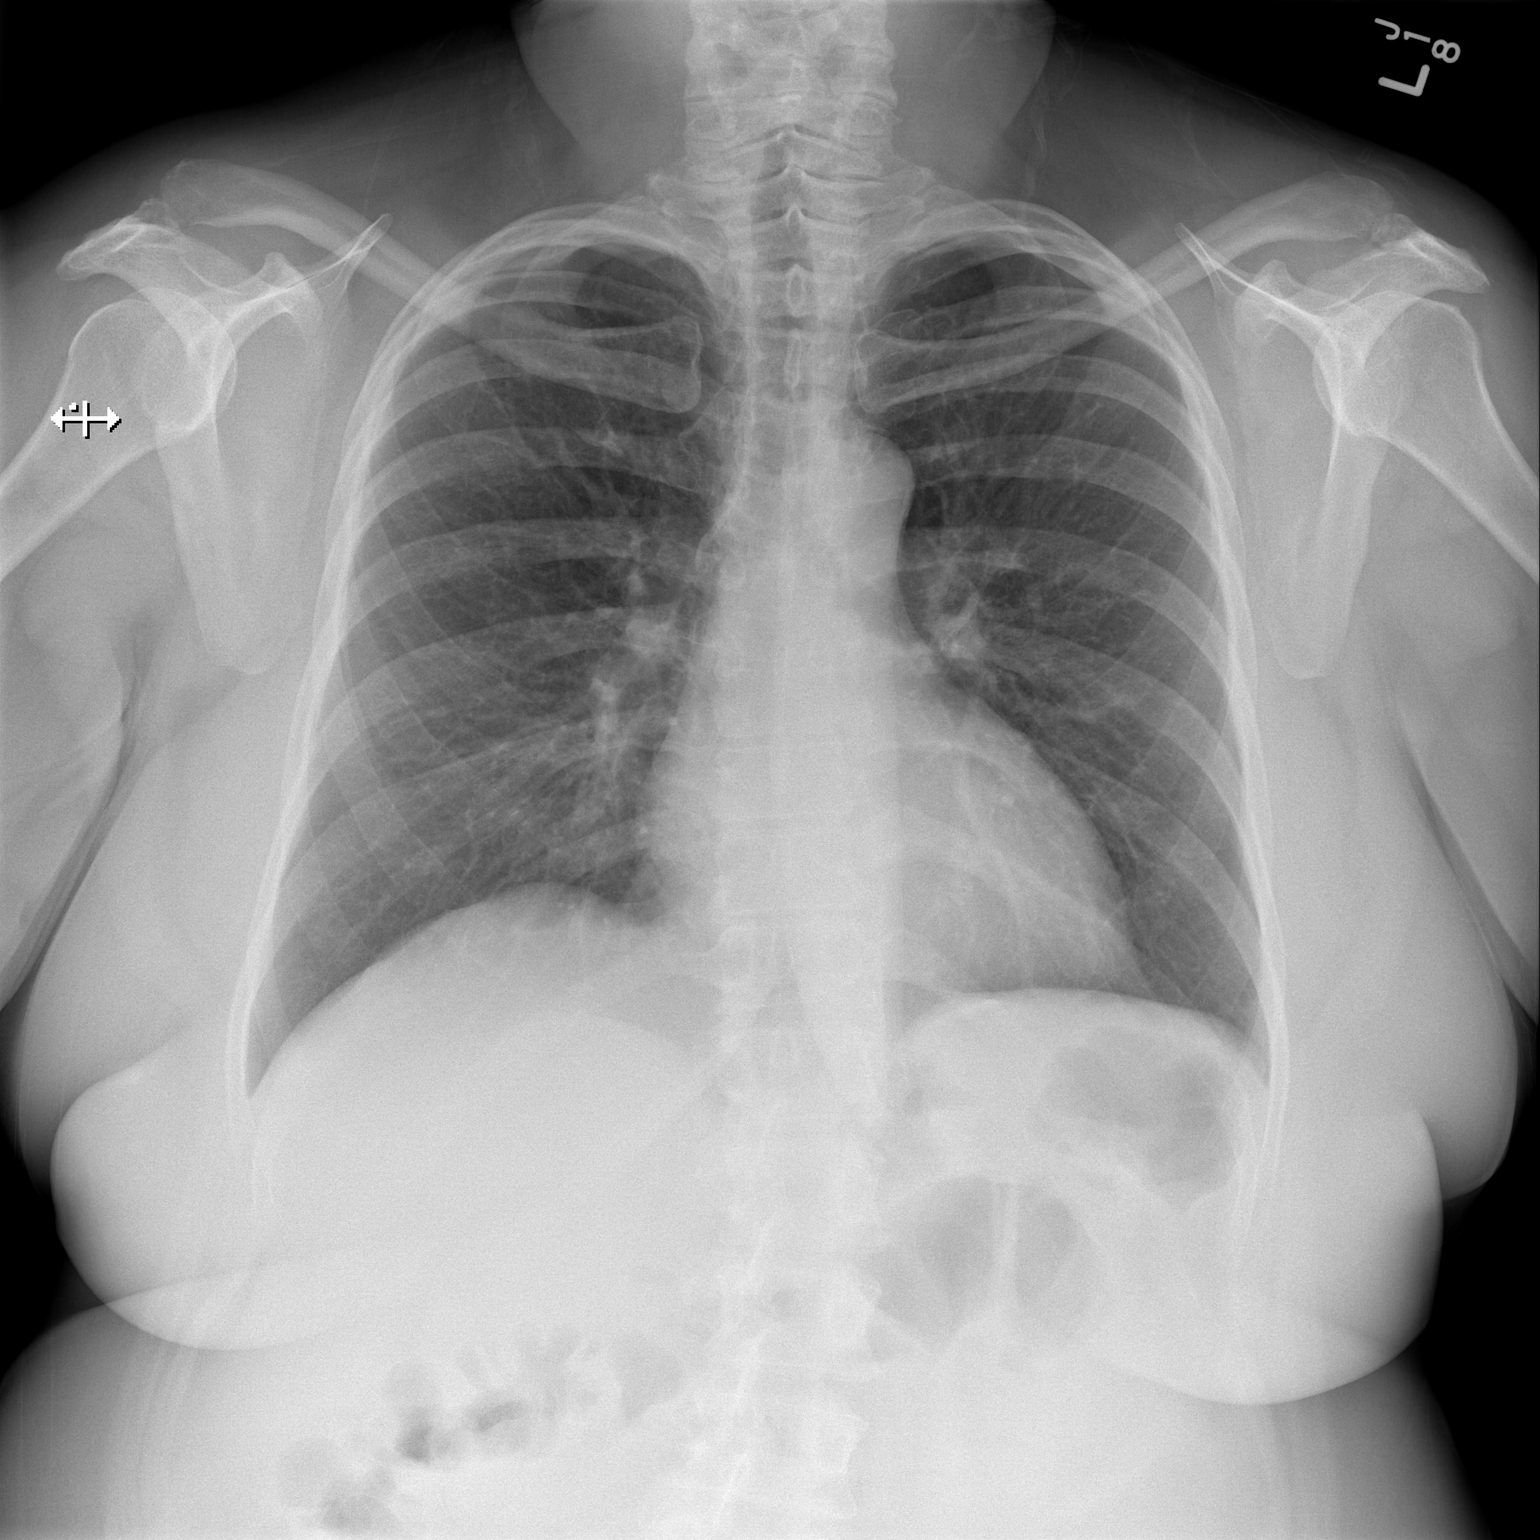

[w chest lat]
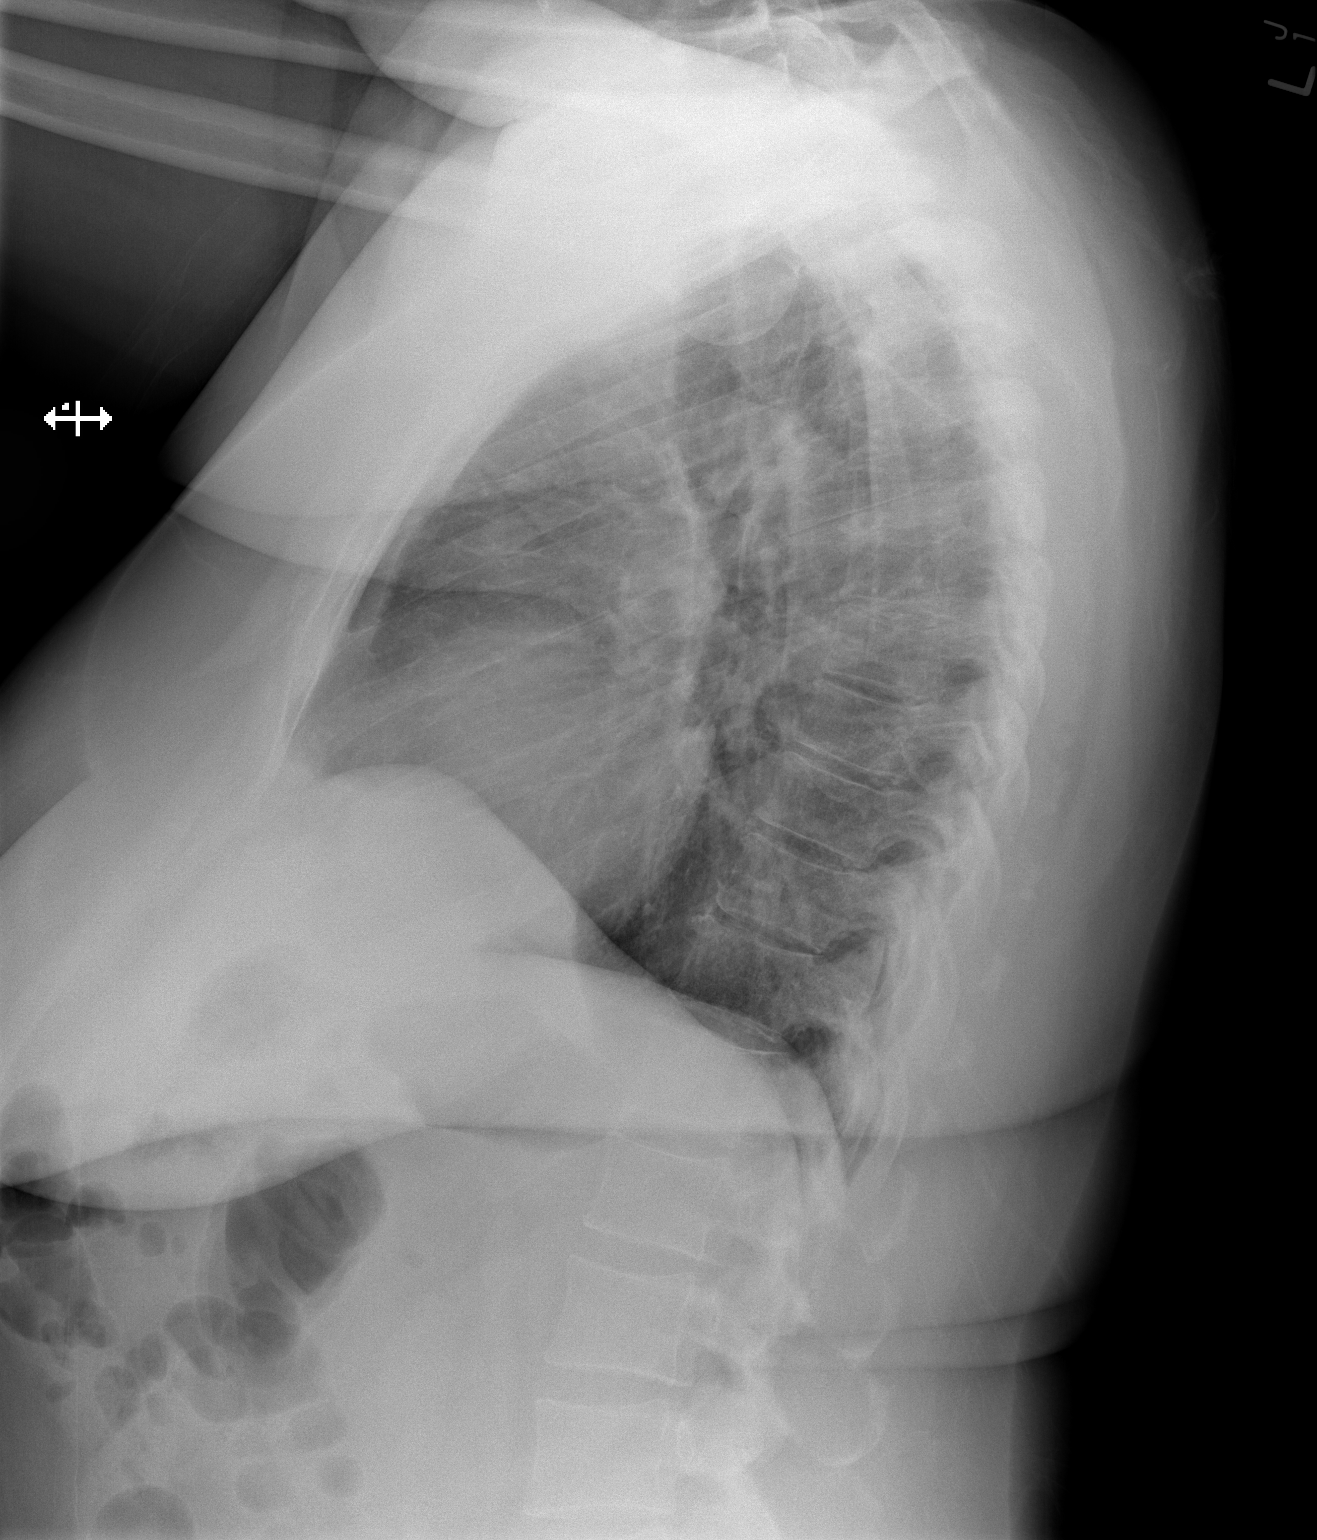

[2 of 2 positions shown; findings below may reference images not displayed]

FINDINGS: The heart size and mediastinal contours are within normal limits. No
acute infiltrate or pleural effusion. No pulmonary edema. Minimal
degenerative changes thoracic spine.
IMPRESSION: No active cardiopulmonary disease.

## 2012-10-30 IMAGING — CR DG LUMBAR SPINE 2-3V
2 series · 2 of 2 positions shown · non-contrast
Comparison: Lumbar MRI, [DATE]

CLINICAL DATA: For L3-L4 lumbar decompression and micro discectomy

EXAM:
LUMBAR SPINE - 2-3 VIEW

[t lumbar spine ap]
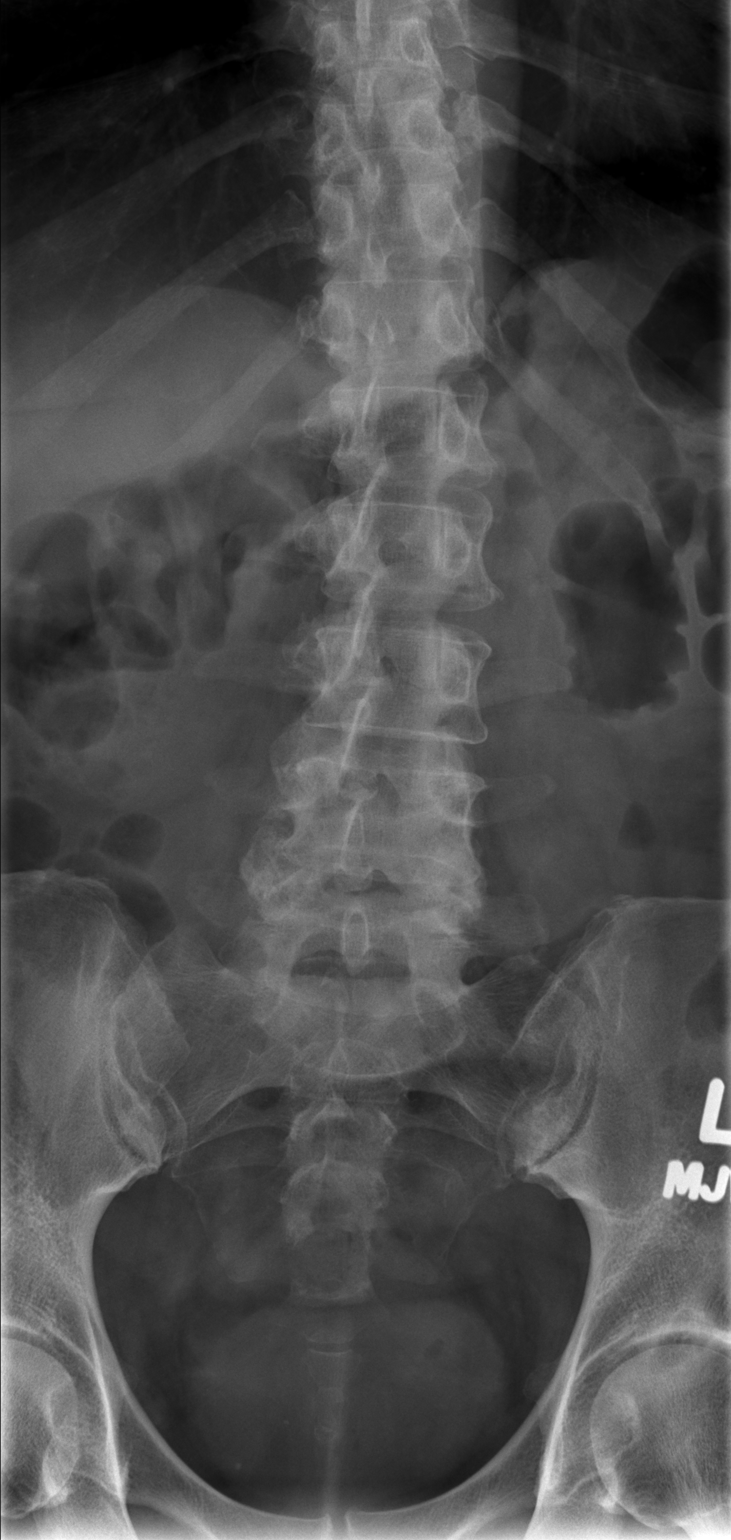

[t lumbar spine lat]
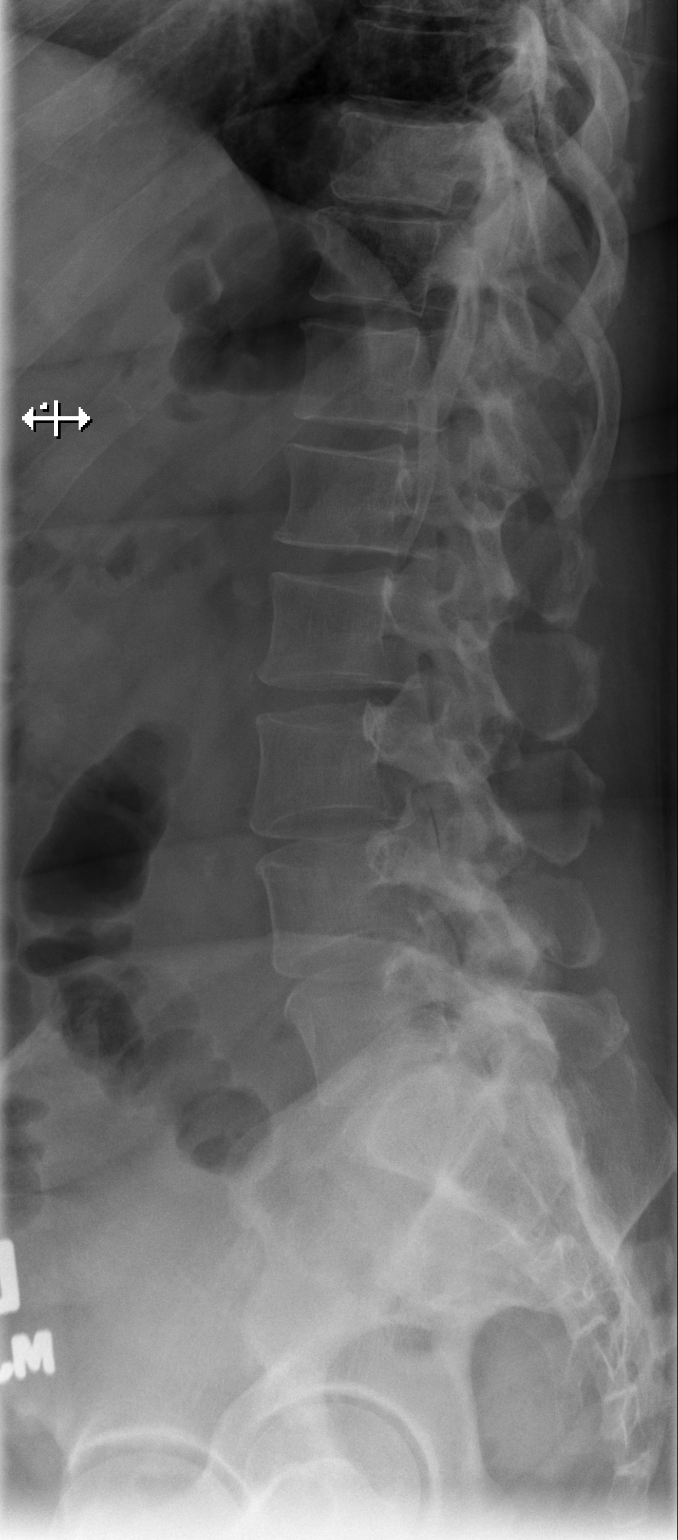

[2 of 2 positions shown; findings below may reference images not displayed]

FINDINGS: There is a mild levoscoliosis with the apex at L1-L2. There is no
fracture or spondylolisthesis. Minor loss of disk height is noted at
L3-L4 with mild loss of disk height at L4-L5. Remaining lumbar disks
are well preserved.

The soft tissues are unremarkable.
IMPRESSION: Mild levoscoliosis and mild disk degenerative changes as described.
No fracture or acute finding.

## 2012-10-30 NOTE — Progress Notes (Signed)
10/30/12 0925  OBSTRUCTIVE SLEEP APNEA  Have you ever been diagnosed with sleep apnea through a sleep study? No  Do you snore loudly (loud enough to be heard through closed doors)?  1  Do you often feel tired, fatigued, or sleepy during the daytime? 0  Has anyone observed you stop breathing during your sleep? 0  Do you have, or are you being treated for high blood pressure? 1  BMI more than 35 kg/m2? 1  Age over 52 years old? 1  Neck circumference greater than 40 cm/18 inches? 0 (15 1/2)  Gender: 0  Obstructive Sleep Apnea Score 4  Score 4 or greater  Results sent to PCP

## 2012-10-30 NOTE — Progress Notes (Signed)
Pt doesn't have a cardiologist  Stress test done about 56yrs ago along with an echo  Denies ever having a heart cath  Medical Md is Dr.Robyn Sanders  EKG to be requested from [facility]  Denies CXR in past yr

## 2012-11-08 MED ORDER — CEFAZOLIN SODIUM-DEXTROSE 2-3 GM-% IV SOLR
2.0000 g | INTRAVENOUS | Status: AC
  Administered 2012-11-09: 2 g via INTRAVENOUS
  Filled 2012-11-08: qty 50

## 2012-11-09 ENCOUNTER — Encounter (HOSPITAL_COMMUNITY): Payer: Worker's Compensation | Admitting: Anesthesiology

## 2012-11-09 ENCOUNTER — Observation Stay (HOSPITAL_COMMUNITY)
Admission: RE | Admit: 2012-11-09 | Discharge: 2012-11-11 | Disposition: A | Discharge status: Home / Self Care | Payer: Worker's Compensation | Source: Ambulatory Visit | Attending: Orthopedic Surgery | Admitting: Orthopedic Surgery

## 2012-11-09 ENCOUNTER — Encounter (HOSPITAL_COMMUNITY): Payer: Self-pay | Admitting: *Deleted

## 2012-11-09 ENCOUNTER — Ambulatory Visit (HOSPITAL_COMMUNITY): Payer: Worker's Compensation | Admitting: Anesthesiology

## 2012-11-09 ENCOUNTER — Ambulatory Visit (HOSPITAL_COMMUNITY): Payer: Worker's Compensation

## 2012-11-09 ENCOUNTER — Encounter (HOSPITAL_COMMUNITY)
Admission: RE | Disposition: A | Discharge status: Home / Self Care | Payer: Self-pay | Source: Ambulatory Visit | Attending: Orthopedic Surgery

## 2012-11-09 DIAGNOSIS — Z23 Encounter for immunization: Secondary | ICD-10-CM | POA: Insufficient documentation

## 2012-11-09 DIAGNOSIS — Z9889 Other specified postprocedural states: Secondary | ICD-10-CM

## 2012-11-09 DIAGNOSIS — M5126 Other intervertebral disc displacement, lumbar region: Principal | ICD-10-CM | POA: Insufficient documentation

## 2012-11-09 DIAGNOSIS — I10 Essential (primary) hypertension: Secondary | ICD-10-CM | POA: Insufficient documentation

## 2012-11-09 HISTORY — DX: Other chronic pain: G89.29

## 2012-11-09 HISTORY — DX: Low back pain, unspecified: M54.50

## 2012-11-09 HISTORY — DX: Low back pain: M54.5

## 2012-11-09 HISTORY — PX: LUMBAR LAMINECTOMY/DECOMPRESSION MICRODISCECTOMY: SHX5026

## 2012-11-09 IMAGING — DX DG LUMBAR SPINE 1V
1 series · 1 of 1 positions shown · non-contrast
Comparison: Earlier in the morning and the MRI of [DATE]

CLINICAL DATA: L3-4 decompression and microdiskectomy.

EXAM:
LUMBAR SPINE - 1 VIEW

[lat]
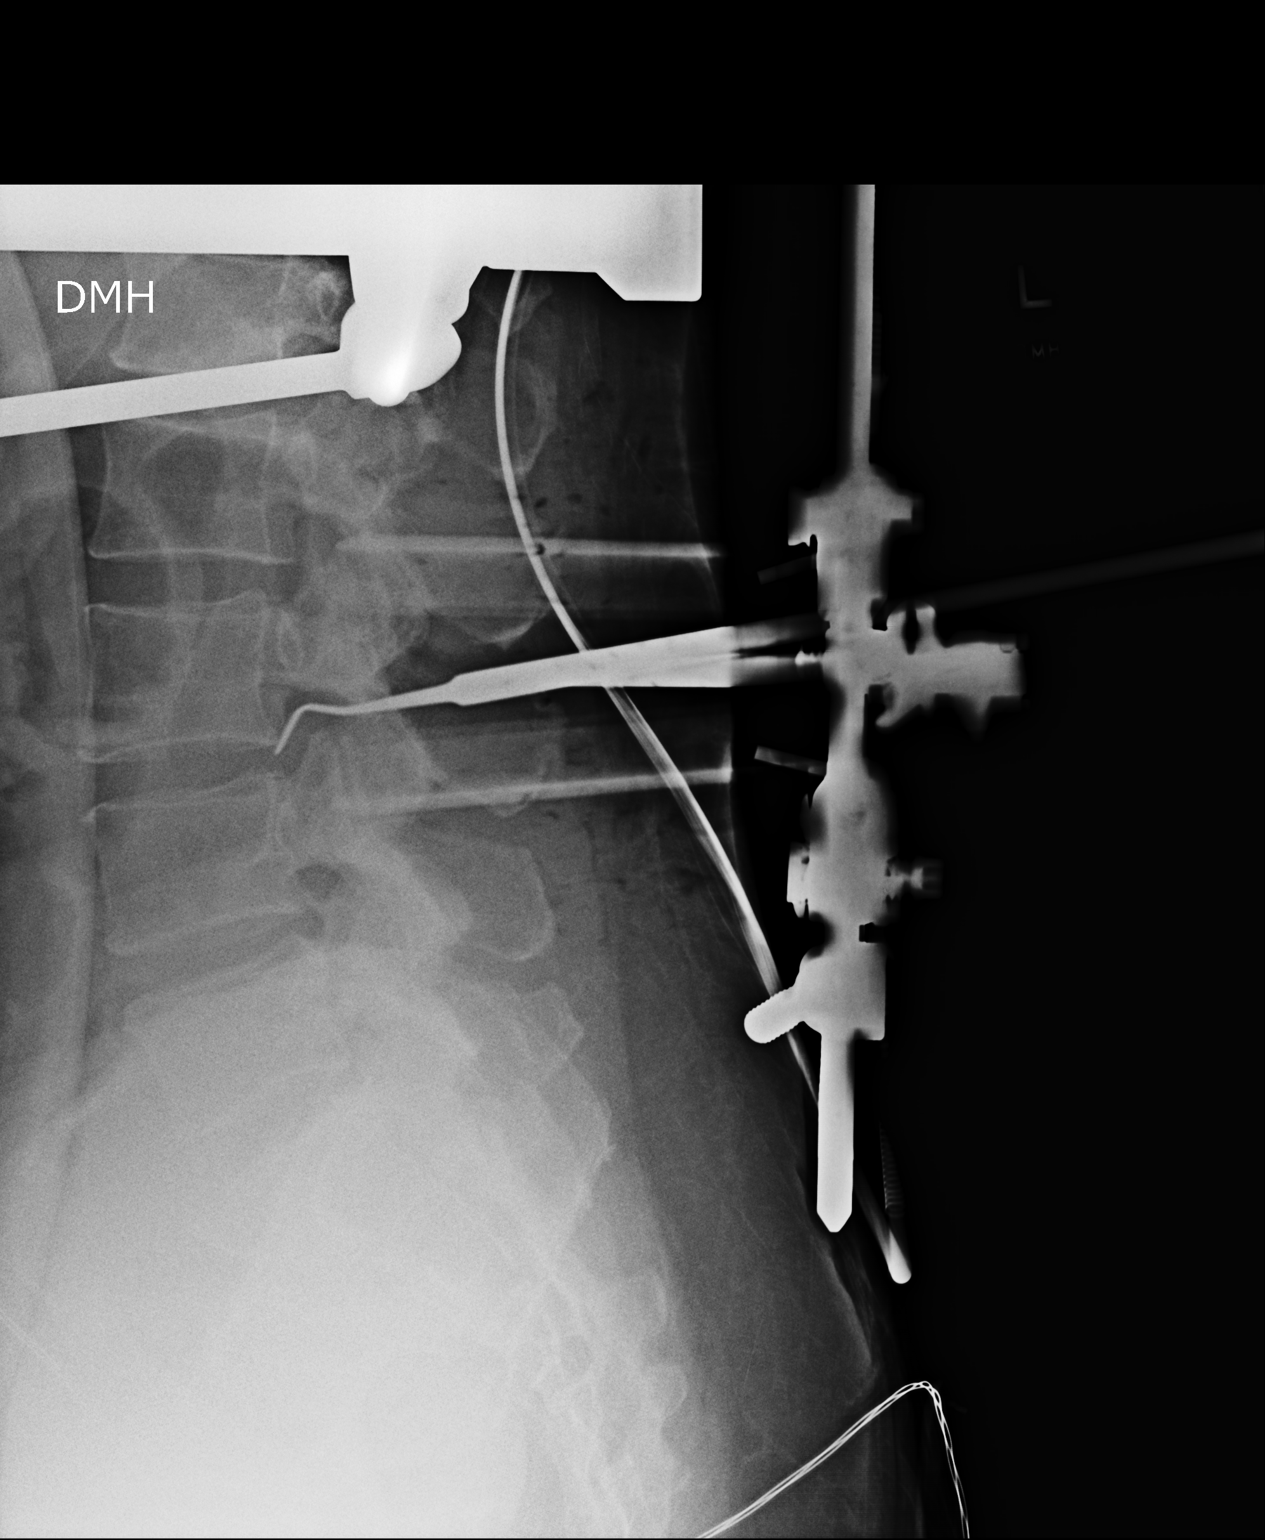

[1 of 1 positions shown; findings below may reference images not displayed]

FINDINGS: Two lateral views. The 1st, labeled 3 and 901 hr. This demonstrates
a surgical device projecting posterior to the L3-4 interspace.

The other image, labeled 4 and 905 hr also demonstrates a surgical
device projecting posterior to the L3-4 interspace.
IMPRESSION: Intraoperative localization of L3-4.

## 2012-11-09 IMAGING — DX DG LUMBAR SPINE 2-3V
1 series · 1 of 1 positions shown · non-contrast
Comparison: Earlier films, same date.

CLINICAL DATA: Intraoperative localization for spine surgery.

EXAM:
LUMBAR SPINE - 2-3 VIEW

[lat]
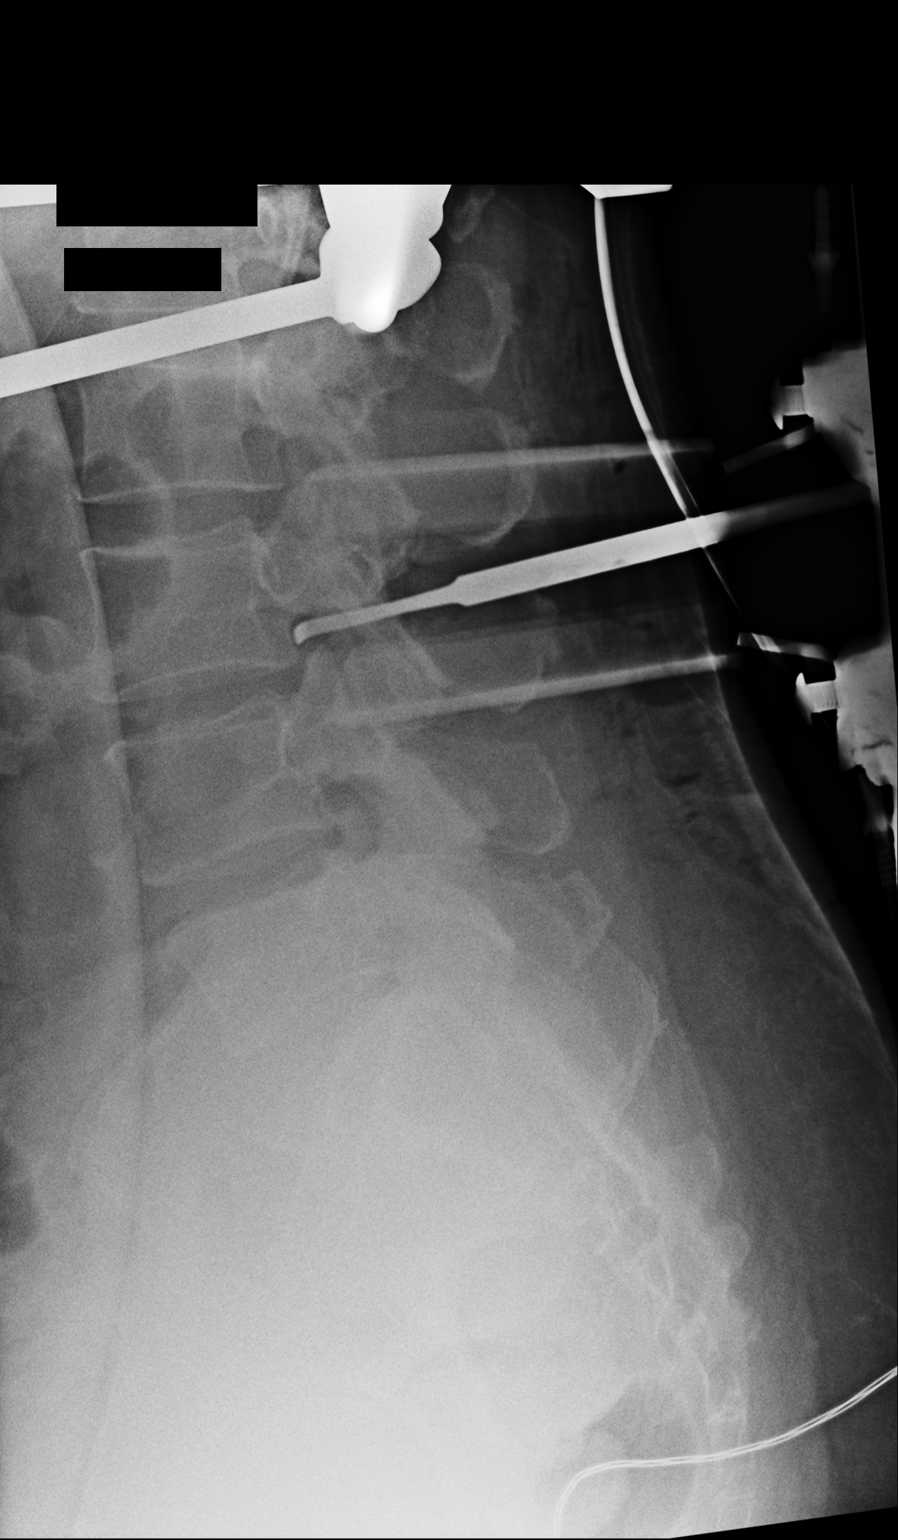

[1 of 1 positions shown; findings below may reference images not displayed]

FINDINGS: Film 1 demonstrates spinal needles marking the L1 and L3 vertebral
body levels.

Film labeled 2. Demonstrates a surgical instrument marking the L3-4
disc space level.
IMPRESSION: L3-4 marked intraoperatively.

## 2012-11-09 IMAGING — DX DG LUMBAR SPINE 2-3V
1 series · 1 of 1 positions shown · non-contrast
Comparison: Earlier films, same date.

CLINICAL DATA: Intraoperative localization for spine surgery.

EXAM:
LUMBAR SPINE - 2-3 VIEW

[lat]
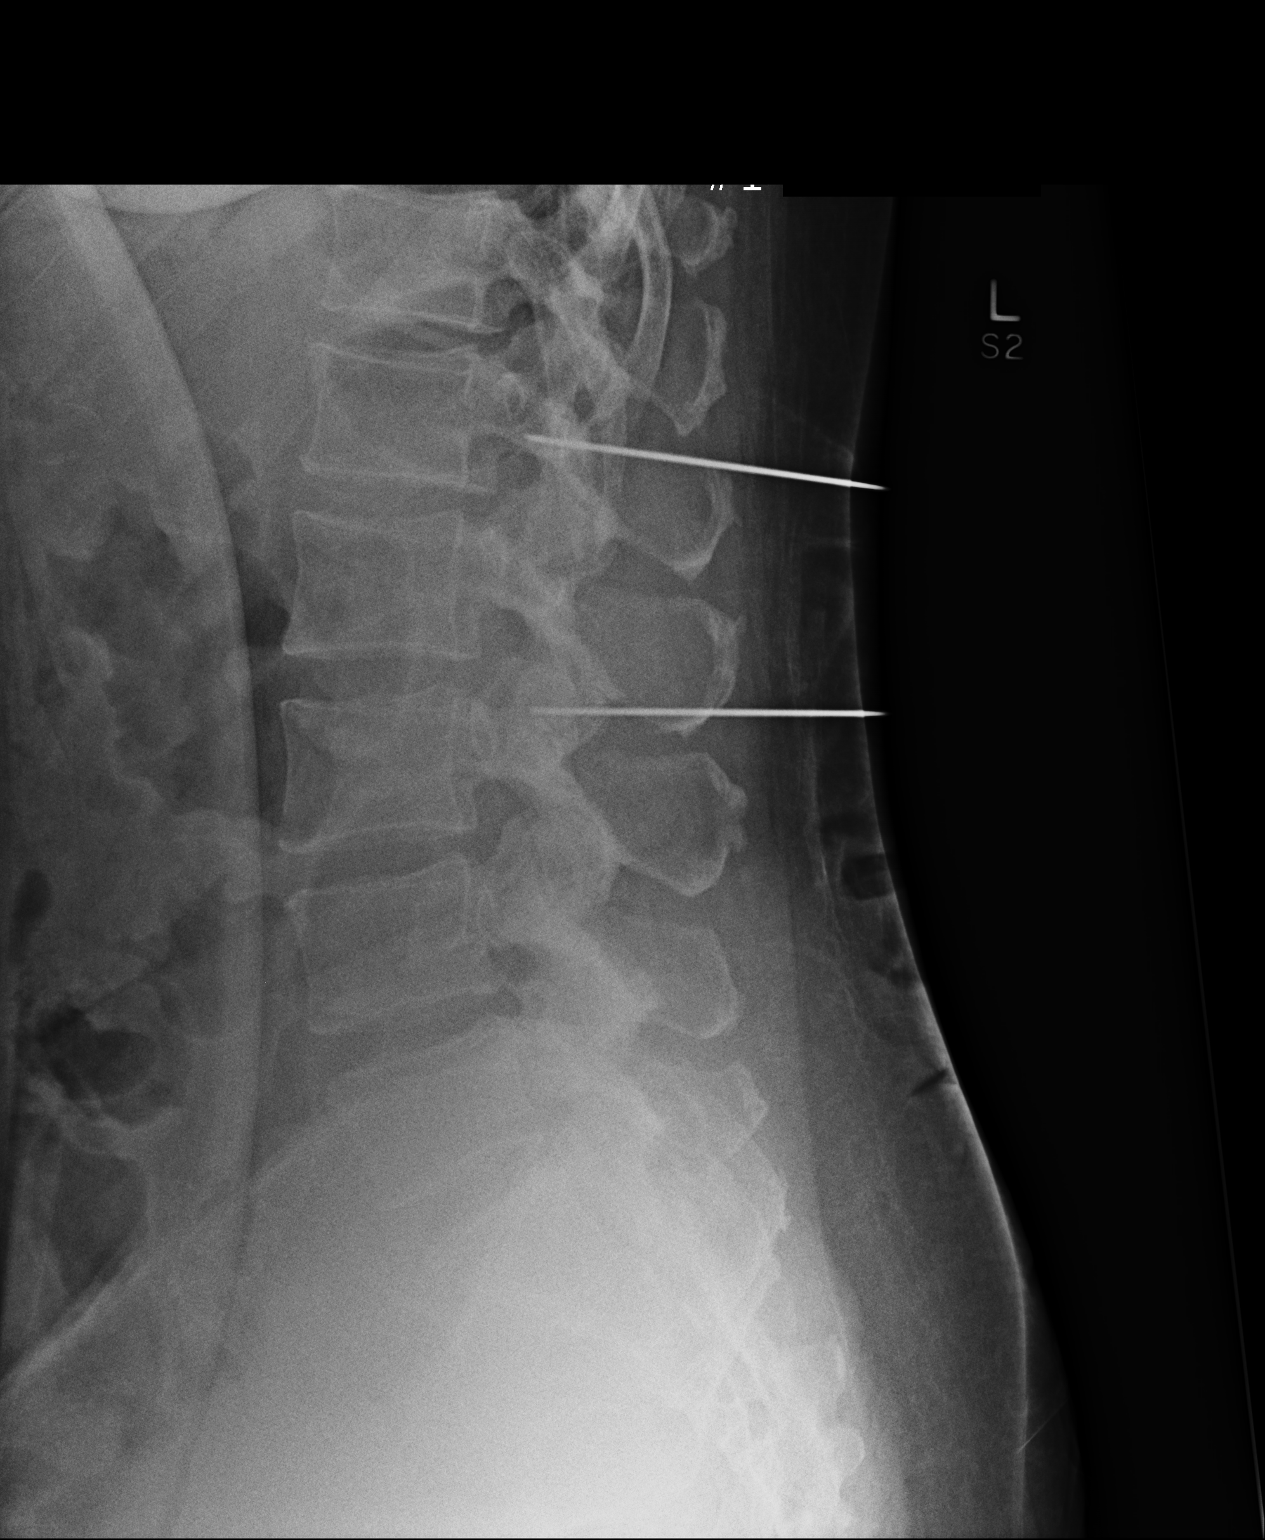

[1 of 1 positions shown; findings below may reference images not displayed]

FINDINGS: Film 1 demonstrates spinal needles marking the L1 and L3 vertebral
body levels.

Film labeled 2. Demonstrates a surgical instrument marking the L3-4
disc space level.
IMPRESSION: L3-4 marked intraoperatively.

## 2012-11-09 IMAGING — DX DG LUMBAR SPINE 1V
1 series · 1 of 1 positions shown · non-contrast
Comparison: Earlier in the morning and the MRI of [DATE]

CLINICAL DATA: L3-4 decompression and microdiskectomy.

EXAM:
LUMBAR SPINE - 1 VIEW

[lat]
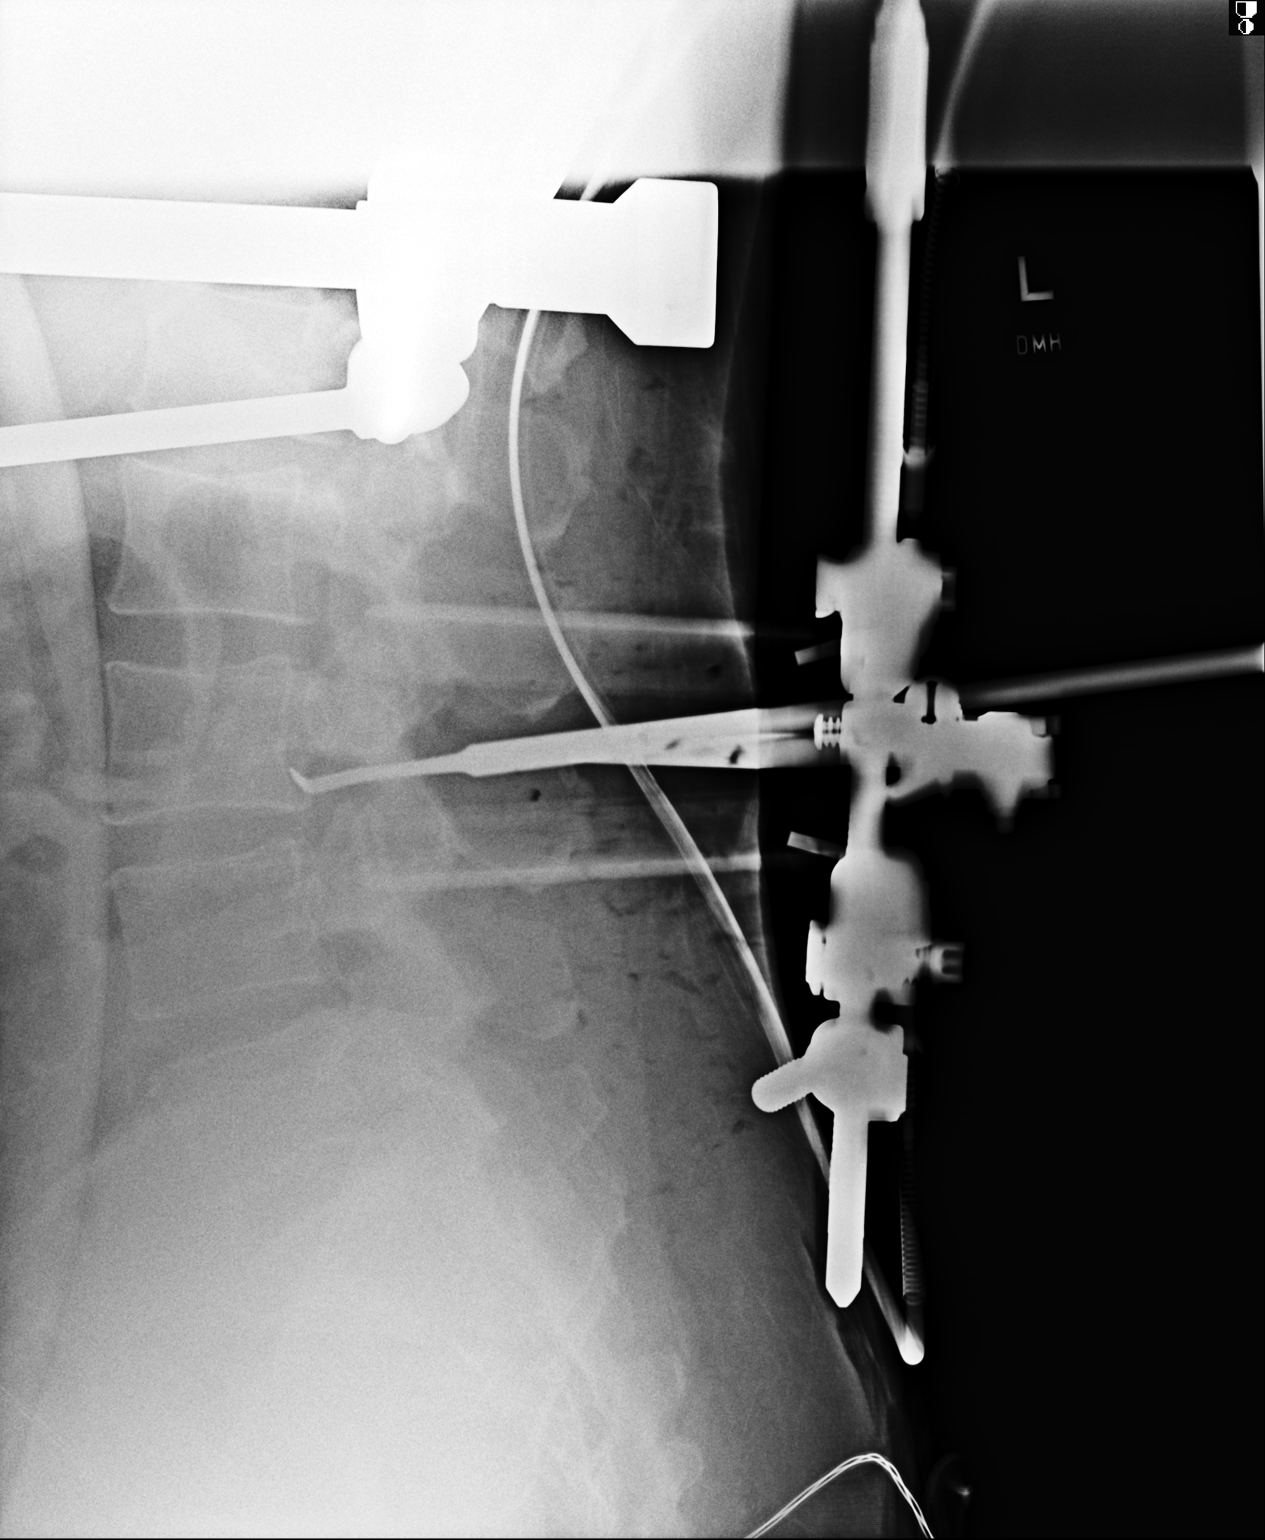

[1 of 1 positions shown; findings below may reference images not displayed]

FINDINGS: Two lateral views. The 1st, labeled 3 and 901 hr. This demonstrates
a surgical device projecting posterior to the L3-4 interspace.

The other image, labeled 4 and 905 hr also demonstrates a surgical
device projecting posterior to the L3-4 interspace.
IMPRESSION: Intraoperative localization of L3-4.

## 2012-11-09 SURGERY — LUMBAR LAMINECTOMY/DECOMPRESSION MICRODISCECTOMY 1 LEVEL
Anesthesia: General | Site: Back | Wound class: Clean

## 2012-11-09 MED ORDER — GLYCOPYRROLATE 0.2 MG/ML IJ SOLN
INTRAMUSCULAR | Status: DC | PRN
  Administered 2012-11-09: .8 mg via INTRAVENOUS

## 2012-11-09 MED ORDER — DEXAMETHASONE SODIUM PHOSPHATE 4 MG/ML IJ SOLN
4.0000 mg | Freq: Four times a day (QID) | INTRAMUSCULAR | Status: DC
  Filled 2012-11-09 (×12): qty 1

## 2012-11-09 MED ORDER — ONDANSETRON HCL 4 MG/2ML IJ SOLN
INTRAMUSCULAR | Status: DC | PRN
  Administered 2012-11-09: 4 mg via INTRAVENOUS

## 2012-11-09 MED ORDER — HYDROMORPHONE HCL PF 1 MG/ML IJ SOLN
0.2500 mg | INTRAMUSCULAR | Status: DC | PRN
  Administered 2012-11-09 (×4): 0.5 mg via INTRAVENOUS

## 2012-11-09 MED ORDER — OXYCODONE HCL 5 MG/5ML PO SOLN
ORAL | Status: AC
  Filled 2012-11-09: qty 5

## 2012-11-09 MED ORDER — LACTATED RINGERS IV SOLN
INTRAVENOUS | Status: DC
  Administered 2012-11-09: 20:00:00 via INTRAVENOUS

## 2012-11-09 MED ORDER — DEXAMETHASONE 4 MG PO TABS
4.0000 mg | ORAL_TABLET | Freq: Four times a day (QID) | ORAL | Status: DC
  Administered 2012-11-09 – 2012-11-11 (×9): 4 mg via ORAL
  Filled 2012-11-09 (×12): qty 1

## 2012-11-09 MED ORDER — PROPOFOL 10 MG/ML IV BOLUS
INTRAVENOUS | Status: DC | PRN
  Administered 2012-11-09: 200 mg via INTRAVENOUS

## 2012-11-09 MED ORDER — FENTANYL CITRATE 0.05 MG/ML IJ SOLN: 50.0000 ug | Freq: Once | INTRAMUSCULAR | Status: DC

## 2012-11-09 MED ORDER — PHENYLEPHRINE HCL 10 MG/ML IJ SOLN
INTRAMUSCULAR | Status: DC | PRN
  Administered 2012-11-09: 80 ug via INTRAVENOUS
  Administered 2012-11-09: 40 ug via INTRAVENOUS

## 2012-11-09 MED ORDER — DEXAMETHASONE SODIUM PHOSPHATE 4 MG/ML IJ SOLN
4.0000 mg | Freq: Once | INTRAMUSCULAR | Status: AC
  Administered 2012-11-09: 8 mg via INTRAVENOUS
  Filled 2012-11-09: qty 1

## 2012-11-09 MED ORDER — SODIUM CHLORIDE 0.9 % IJ SOLN: 3.0000 mL | INTRAMUSCULAR | Status: DC | PRN

## 2012-11-09 MED ORDER — NEOSTIGMINE METHYLSULFATE 1 MG/ML IJ SOLN
INTRAMUSCULAR | Status: DC | PRN
  Administered 2012-11-09: 5 mg via INTRAVENOUS

## 2012-11-09 MED ORDER — ACETAMINOPHEN 10 MG/ML IV SOLN
1000.0000 mg | Freq: Four times a day (QID) | INTRAVENOUS | Status: DC
  Administered 2012-11-09: 1000 mg via INTRAVENOUS
  Filled 2012-11-09: qty 100

## 2012-11-09 MED ORDER — LIDOCAINE HCL (CARDIAC) 20 MG/ML IV SOLN
INTRAVENOUS | Status: DC | PRN
  Administered 2012-11-09: 100 mg via INTRAVENOUS

## 2012-11-09 MED ORDER — MIDAZOLAM HCL 2 MG/2ML IJ SOLN: 1.0000 mg | INTRAMUSCULAR | Status: DC | PRN

## 2012-11-09 MED ORDER — HYDROMORPHONE HCL PF 1 MG/ML IJ SOLN
INTRAMUSCULAR | Status: AC
  Filled 2012-11-09: qty 1

## 2012-11-09 MED ORDER — OXYCODONE HCL 5 MG PO TABS
10.0000 mg | ORAL_TABLET | ORAL | Status: DC | PRN
  Administered 2012-11-09 – 2012-11-11 (×7): 10 mg via ORAL
  Filled 2012-11-09 (×7): qty 2

## 2012-11-09 MED ORDER — ONDANSETRON HCL 4 MG/2ML IJ SOLN: 4.0000 mg | INTRAMUSCULAR | Status: DC | PRN

## 2012-11-09 MED ORDER — MIDAZOLAM HCL 5 MG/5ML IJ SOLN
INTRAMUSCULAR | Status: DC | PRN
  Administered 2012-11-09: 2 mg via INTRAVENOUS

## 2012-11-09 MED ORDER — CEFAZOLIN SODIUM 1-5 GM-% IV SOLN
1.0000 g | Freq: Three times a day (TID) | INTRAVENOUS | Status: AC
  Administered 2012-11-09 – 2012-11-10 (×2): 1 g via INTRAVENOUS
  Filled 2012-11-09 (×2): qty 50

## 2012-11-09 MED ORDER — 0.9 % SODIUM CHLORIDE (POUR BTL) OPTIME
TOPICAL | Status: DC | PRN
  Administered 2012-11-09: 1000 mL

## 2012-11-09 MED ORDER — THROMBIN 20000 UNITS EX SOLR
CUTANEOUS | Status: AC
  Filled 2012-11-09: qty 20000

## 2012-11-09 MED ORDER — HEMOSTATIC AGENTS (NO CHARGE) OPTIME
TOPICAL | Status: DC | PRN
  Administered 2012-11-09: 1 via TOPICAL

## 2012-11-09 MED ORDER — LACTATED RINGERS IV SOLN
INTRAVENOUS | Status: DC | PRN
  Administered 2012-11-09 (×2): via INTRAVENOUS

## 2012-11-09 MED ORDER — HYDROCHLOROTHIAZIDE 12.5 MG PO CAPS
12.5000 mg | ORAL_CAPSULE | Freq: Every day | ORAL | Status: DC
  Administered 2012-11-09 – 2012-11-11 (×3): 12.5 mg via ORAL
  Filled 2012-11-09 (×3): qty 1

## 2012-11-09 MED ORDER — ZOLPIDEM TARTRATE 5 MG PO TABS: 5.0000 mg | ORAL_TABLET | Freq: Every evening | ORAL | Status: DC | PRN

## 2012-11-09 MED ORDER — INFLUENZA VAC SPLIT QUAD 0.5 ML IM SUSP
0.5000 mL | INTRAMUSCULAR | Status: AC
  Administered 2012-11-10: 0.5 mL via INTRAMUSCULAR
  Filled 2012-11-09: qty 0.5

## 2012-11-09 MED ORDER — GELATIN ABSORBABLE MT POWD
OROMUCOSAL | Status: DC | PRN
  Administered 2012-11-09: 08:00:00 via TOPICAL

## 2012-11-09 MED ORDER — METHOCARBAMOL 100 MG/ML IJ SOLN
500.0000 mg | Freq: Four times a day (QID) | INTRAMUSCULAR | Status: DC | PRN
  Filled 2012-11-09: qty 5

## 2012-11-09 MED ORDER — METHOCARBAMOL 500 MG PO TABS
500.0000 mg | ORAL_TABLET | Freq: Four times a day (QID) | ORAL | Status: DC | PRN
  Administered 2012-11-09 – 2012-11-11 (×4): 500 mg via ORAL
  Filled 2012-11-09 (×5): qty 1

## 2012-11-09 MED ORDER — IRBESARTAN 300 MG PO TABS
300.0000 mg | ORAL_TABLET | Freq: Every day | ORAL | Status: DC
  Administered 2012-11-09 – 2012-11-11 (×3): 300 mg via ORAL
  Filled 2012-11-09 (×3): qty 1

## 2012-11-09 MED ORDER — ROCURONIUM BROMIDE 100 MG/10ML IV SOLN
INTRAVENOUS | Status: DC | PRN
  Administered 2012-11-09: 50 mg via INTRAVENOUS

## 2012-11-09 MED ORDER — FENTANYL CITRATE 0.05 MG/ML IJ SOLN
INTRAMUSCULAR | Status: DC | PRN
  Administered 2012-11-09 (×3): 50 ug via INTRAVENOUS
  Administered 2012-11-09: 100 ug via INTRAVENOUS
  Administered 2012-11-09 (×2): 50 ug via INTRAVENOUS

## 2012-11-09 MED ORDER — OXYCODONE HCL 5 MG/5ML PO SOLN
5.0000 mg | Freq: Once | ORAL | Status: AC | PRN
  Administered 2012-11-09: 5 mg via ORAL

## 2012-11-09 MED ORDER — SODIUM CHLORIDE 0.9 % IJ SOLN
3.0000 mL | Freq: Two times a day (BID) | INTRAMUSCULAR | Status: DC
  Administered 2012-11-09: 3 mL via INTRAVENOUS

## 2012-11-09 MED ORDER — BUPIVACAINE-EPINEPHRINE 0.25% -1:200000 IJ SOLN
INTRAMUSCULAR | Status: DC | PRN
  Administered 2012-11-09: 10 mL

## 2012-11-09 MED ORDER — ARTIFICIAL TEARS OP OINT
TOPICAL_OINTMENT | OPHTHALMIC | Status: DC | PRN
  Administered 2012-11-09: 1 via OPHTHALMIC

## 2012-11-09 MED ORDER — MENTHOL 3 MG MT LOZG: 1.0000 | LOZENGE | OROMUCOSAL | Status: DC | PRN

## 2012-11-09 MED ORDER — OLMESARTAN MEDOXOMIL-HCTZ 40-12.5 MG PO TABS: 1.0000 | ORAL_TABLET | Freq: Every day | ORAL | Status: DC

## 2012-11-09 MED ORDER — PROMETHAZINE HCL 25 MG/ML IJ SOLN: 6.2500 mg | INTRAMUSCULAR | Status: DC | PRN

## 2012-11-09 MED ORDER — SODIUM CHLORIDE 0.9 % IV SOLN: 250.0000 mL | INTRAVENOUS | Status: DC

## 2012-11-09 MED ORDER — BUPIVACAINE-EPINEPHRINE PF 0.25-1:200000 % IJ SOLN
INTRAMUSCULAR | Status: AC
  Filled 2012-11-09: qty 30

## 2012-11-09 MED ORDER — ACETAMINOPHEN 10 MG/ML IV SOLN
1000.0000 mg | Freq: Four times a day (QID) | INTRAVENOUS | Status: AC
  Administered 2012-11-09 – 2012-11-10 (×4): 1000 mg via INTRAVENOUS
  Filled 2012-11-09 (×4): qty 100

## 2012-11-09 MED ORDER — PHENOL 1.4 % MT LIQD: 1.0000 | OROMUCOSAL | Status: DC | PRN

## 2012-11-09 MED ORDER — MORPHINE SULFATE 2 MG/ML IJ SOLN: 1.0000 mg | INTRAMUSCULAR | Status: DC | PRN

## 2012-11-09 MED ORDER — OXYCODONE HCL 5 MG PO TABS: 5.0000 mg | ORAL_TABLET | Freq: Once | ORAL | Status: AC | PRN

## 2012-11-09 SURGICAL SUPPLY — 55 items
BUR EGG ELITE 4.0 (BURR) IMPLANT
BUR MATCHSTICK NEURO 3.0 LAGG (BURR) IMPLANT
CANISTER SUCTION 2500CC (MISCELLANEOUS) ×2 IMPLANT
CLOTH BEACON ORANGE TIMEOUT ST (SAFETY) ×2 IMPLANT
CLSR STERI-STRIP ANTIMIC 1/2X4 (GAUZE/BANDAGES/DRESSINGS) ×2 IMPLANT
CORDS BIPOLAR (ELECTRODE) ×2 IMPLANT
COVER SURGICAL LIGHT HANDLE (MISCELLANEOUS) ×2 IMPLANT
DRAIN CHANNEL 15F RND FF W/TCR (WOUND CARE) ×2 IMPLANT
DRAPE POUCH INSTRU U-SHP 10X18 (DRAPES) ×2 IMPLANT
DRAPE SURG 17X23 STRL (DRAPES) ×2 IMPLANT
DRAPE U-SHAPE 47X51 STRL (DRAPES) ×2 IMPLANT
DRSG MEPILEX BORDER 4X8 (GAUZE/BANDAGES/DRESSINGS) ×2 IMPLANT
DURAPREP 26ML APPLICATOR (WOUND CARE) ×2 IMPLANT
ELECT BLADE 4.0 EZ CLEAN MEGAD (MISCELLANEOUS) ×2
ELECT CAUTERY BLADE 6.4 (BLADE) ×2 IMPLANT
ELECT REM PT RETURN 9FT ADLT (ELECTROSURGICAL) ×2
ELECTRODE BLDE 4.0 EZ CLN MEGD (MISCELLANEOUS) IMPLANT
ELECTRODE REM PT RTRN 9FT ADLT (ELECTROSURGICAL) ×1 IMPLANT
EVACUATOR SILICONE 100CC (DRAIN) ×2 IMPLANT
GLOVE BIOGEL PI IND STRL 8 (GLOVE) ×1 IMPLANT
GLOVE BIOGEL PI IND STRL 8.5 (GLOVE) ×1 IMPLANT
GLOVE BIOGEL PI INDICATOR 8 (GLOVE) ×1
GLOVE BIOGEL PI INDICATOR 8.5 (GLOVE) ×1
GLOVE ECLIPSE 8.5 STRL (GLOVE) ×2 IMPLANT
GLOVE ORTHO TXT STRL SZ7.5 (GLOVE) ×2 IMPLANT
GOWN PREVENTION PLUS XXLARGE (GOWN DISPOSABLE) ×2 IMPLANT
GOWN STRL NON-REIN LRG LVL3 (GOWN DISPOSABLE) ×2 IMPLANT
GOWN STRL REIN 2XL XLG LVL4 (GOWN DISPOSABLE) ×2 IMPLANT
GOWN STRL REIN XL XLG (GOWN DISPOSABLE) ×4 IMPLANT
KIT BASIN OR (CUSTOM PROCEDURE TRAY) ×2 IMPLANT
KIT ROOM TURNOVER OR (KITS) ×2 IMPLANT
NDL SPNL 18GX3.5 QUINCKE PK (NEEDLE) ×2 IMPLANT
NEEDLE 22X1 1/2 (OR ONLY) (NEEDLE) ×2 IMPLANT
NEEDLE SPNL 18GX3.5 QUINCKE PK (NEEDLE) ×4 IMPLANT
NS IRRIG 1000ML POUR BTL (IV SOLUTION) ×2 IMPLANT
PACK LAMINECTOMY ORTHO (CUSTOM PROCEDURE TRAY) ×2 IMPLANT
PACK UNIVERSAL I (CUSTOM PROCEDURE TRAY) ×2 IMPLANT
PAD ARMBOARD 7.5X6 YLW CONV (MISCELLANEOUS) ×4 IMPLANT
PATTIES SURGICAL .5 X.5 (GAUZE/BANDAGES/DRESSINGS) IMPLANT
PATTIES SURGICAL .5 X1 (DISPOSABLE) ×2 IMPLANT
SPONGE SURGIFOAM ABS GEL 100 (HEMOSTASIS) ×1 IMPLANT
SURGIFLO TRUKIT (HEMOSTASIS) IMPLANT
SUT MON AB 3-0 SH 27 (SUTURE) ×2
SUT MON AB 3-0 SH27 (SUTURE) ×1 IMPLANT
SUT VIC AB 0 CT1 27 (SUTURE) ×2
SUT VIC AB 0 CT1 27XBRD ANBCTR (SUTURE) ×1 IMPLANT
SUT VIC AB 1 CTX 36 (SUTURE) ×4
SUT VIC AB 1 CTX36XBRD ANBCTR (SUTURE) ×2 IMPLANT
SUT VIC AB 2-0 CT1 18 (SUTURE) ×2 IMPLANT
SYR BULB IRRIGATION 50ML (SYRINGE) ×2 IMPLANT
SYR CONTROL 10ML LL (SYRINGE) ×2 IMPLANT
TOWEL OR 17X24 6PK STRL BLUE (TOWEL DISPOSABLE) ×2 IMPLANT
TOWEL OR 17X26 10 PK STRL BLUE (TOWEL DISPOSABLE) ×2 IMPLANT
WATER STERILE IRR 1000ML POUR (IV SOLUTION) ×2 IMPLANT
YANKAUER SUCT BULB TIP NO VENT (SUCTIONS) ×2 IMPLANT

## 2012-11-09 NOTE — Anesthesia Postprocedure Evaluation (Signed)
  Anesthesia Post-op Note  Patient: [name]  Procedure(s) Performed: Procedure(s): L3-L4 DECOMPRESSION AND MICRODISCECTOMY   (1 LEVEL) (N/A)  Patient Location: PACU  Anesthesia Type:General  Level of Consciousness: awake  Airway and Oxygen Therapy: Patient Spontanous Breathing  Post-op Pain: mild  Post-op Assessment: Post-op Vital signs reviewed, Patient's Cardiovascular Status Stable, Respiratory Function Stable, Patent Airway, No signs of Nausea or vomiting and Pain level controlled  Post-op Vital Signs: Reviewed and stable  Complications: No apparent anesthesia complications

## 2012-11-09 NOTE — Preoperative (Signed)
Beta Blockers   Reason not to administer Beta Blockers:Not Applicable 

## 2012-11-09 NOTE — OR Nursing (Signed)
Dr. Pecolia Ades confirmed placement of probe at just above the L3-4 disc space

## 2012-11-09 NOTE — Brief Op Note (Signed)
  11/09/2012  9:52 AM  PATIENT:  [name]  52 y.o. female  PRE-OPERATIVE DIAGNOSIS:  L3-L4 RIGHT HNP  POST-OPERATIVE DIAGNOSIS:  L3-L4 RIGHT HNP  PROCEDURE:  Procedure(s): L3-L4 DECOMPRESSION AND MICRODISCECTOMY   (1 LEVEL) (N/A)  SURGEON:  Surgeon(s) and Role:    * Venita Lick, MD - Primary  PHYSICIAN ASSISTANT:   ASSISTANTS: Zonia Kief  ANESTHESIA:   general  EBL:  Total I/O In: 1000 [I.V.:1000] Out: -   BLOOD ADMINISTERED:none  DRAINS: none   LOCAL MEDICATIONS USED:  MARCAINE     SPECIMEN:  No Specimen  DISPOSITION OF SPECIMEN:  N/A  COUNTS:  YES  TOURNIQUET:  * No tourniquets in log *  DICTATION: .Other Dictation: Dictation Number [id number]  PLAN OF CARE: Admit for overnight observation  PATIENT DISPOSITION:  PACU - hemodynamically stable.

## 2012-11-09 NOTE — Transfer of Care (Signed)
Immediate Anesthesia Transfer of Care Note  Patient: [name]  Procedure(s) Performed: Procedure(s): L3-L4 DECOMPRESSION AND MICRODISCECTOMY   (1 LEVEL) (N/A)  Patient Location: PACU  Anesthesia Type:General  Level of Consciousness: awake, alert , oriented and patient cooperative  Airway & Oxygen Therapy: Patient Spontanous Breathing  Post-op Assessment: Report given to PACU RN, Post -op Vital signs reviewed and stable and Patient moving all extremities X 4  Post vital signs: Reviewed and stable  Complications: No apparent anesthesia complications

## 2012-11-09 NOTE — H&P (Signed)
No change in clinical exam H+P reviewed  

## 2012-11-09 NOTE — Anesthesia Preprocedure Evaluation (Signed)
Anesthesia Evaluation  Patient identified by MRN, date of birth, ID band Patient awake    Reviewed: Allergy & Precautions, H&P , NPO status , Patient's Chart, lab work & pertinent test results  Airway Mallampati: II TM Distance: <3 FB Neck ROM: Full    Dental   Pulmonary  breath sounds clear to auscultation        Cardiovascular hypertension, Rhythm:Regular Rate:Normal     Neuro/Psych  Headaches, Depression    GI/Hepatic   Endo/Other  Morbid obesity  Renal/GU      Musculoskeletal   Abdominal (+) + obese,   Peds  Hematology   Anesthesia Other Findings   Reproductive/Obstetrics                           Anesthesia Physical Anesthesia Plan  ASA: II  Anesthesia Plan: General   Post-op Pain Management:    Induction: Intravenous  Airway Management Planned: Oral ETT  Additional Equipment:   Intra-op Plan:   Post-operative Plan: Extubation in OR  Informed Consent: I have reviewed the patients History and Physical, chart, labs and discussed the procedure including the risks, benefits and alternatives for the proposed anesthesia with the patient or authorized representative who has indicated his/her understanding and acceptance.     Plan Discussed with: CRNA and Surgeon  Anesthesia Plan Comments:         Anesthesia Quick Evaluation

## 2012-11-09 NOTE — Evaluation (Signed)
Physical Therapy Evaluation Patient Details Name: [name] MRN: [id number] DOB: Dec 05, 1960 Today's Date: 11/09/2012 Time: 4098-1191 PT Time Calculation (min): 28 min  PT Assessment / Plan / Recommendation History of Present Illness  Pt is a 52 y/o female admitted s/p L3-4 decompression and microdiscectomy on 11/09/2012.  Clinical Impression  This patient presents with acute pain and decreased functional independence following the above mentioned procedure. At the time of PT eval, pt was able to perform transfers and ambulation with min guard to min assist, with good maintenance of back precautions. This patient is appropriate for skilled PT interventions to address functional limitations, improve safety and independence with functional mobility, and return to PLOF.     PT Assessment  Patient needs continued PT services    Follow Up Recommendations  Home health PT    Does the patient have the potential to tolerate intense rehabilitation      Barriers to Discharge Decreased caregiver support      Equipment Recommendations  Rolling walker with 5" wheels;3in1 (PT)    Recommendations for Other Services     Frequency Min 5X/week    Precautions / Restrictions Precautions Precautions: Fall;Back Precaution Booklet Issued: Yes (comment) Restrictions Weight Bearing Restrictions: No   Pertinent Vitals/Pain 7/10 after ambulation      Mobility  Bed Mobility Bed Mobility: Rolling Left;Left Sidelying to Sit;Sitting - Scoot to [place] of Bed Rolling Left: 4: Min guard Left Sidelying to Sit: 4: Min assist Sitting - Scoot to [place] of Bed: 4: Min guard Details for Bed Mobility Assistance: Use of bed rails for assist, with VC's for sequencing and safety awareness with log roll Transfers Transfers: Sit to Stand;Stand to Sit Sit to Stand: 4: Min guard;From bed;With upper extremity assist Stand to Sit: 4: Min guard;To chair/3-in-1;With upper extremity assist Details for Transfer  Assistance: VC's for hand placement on seated surface and to maintain back precautions. Ambulation/Gait Ambulation/Gait Assistance: 4: Min guard Ambulation Distance (Feet): 10 Feet Assistive device: Rolling walker Ambulation/Gait Assistance Details: VC's for sequencing and safety awareness with the RW. Little WB through UE's, mainly used walker for balance support. Gait Pattern: Step-through pattern;Decreased stride length Gait velocity: decreased Stairs: No Wheelchair Mobility Wheelchair Mobility: No    Exercises     PT Diagnosis: Difficulty walking;Acute pain  PT Problem List: Decreased strength;Decreased range of motion;Decreased activity tolerance;Decreased balance;Decreased knowledge of use of DME;Decreased safety awareness;Decreased knowledge of precautions;Pain PT Treatment Interventions: DME instruction;Gait training;Functional mobility training;Therapeutic activities;Therapeutic exercise;Neuromuscular re-education;Patient/family education     PT Goals(Current goals can be found in the care plan section) Acute Rehab PT Goals Patient Stated Goal: To return home with son PT Goal Formulation: With patient Time For Goal Achievement: 11/16/12 Potential to Achieve Goals: Good  Visit Information  Last PT Received On: 11/09/12 Assistance Needed: +1 History of Present Illness: Pt is a 52 y/o female admitted s/p L3-4 decompression and microdiscectomy on 11/09/2012.       Prior Functioning  Home Living Family/patient expects to be discharged to:: Private residence Living Arrangements: Children Available Help at Discharge: Family;Available PRN/intermittently Type of Home: House Home Access: Level entry Home Layout: One level Home Equipment: None Prior Function Level of Independence: Independent Communication Communication: No difficulties Dominant Hand: Right    Cognition  Cognition Arousal/Alertness: Awake/alert Behavior During Therapy: WFL for tasks  assessed/performed Overall Cognitive Status: Within Functional Limits for tasks assessed    Extremity/Trunk Assessment Upper Extremity Assessment Upper Extremity Assessment: Overall WFL for tasks assessed Lower Extremity Assessment Lower  Extremity Assessment: Overall WFL for tasks assessed Cervical / Trunk Assessment Cervical / Trunk Assessment: Normal   Balance Balance Balance Assessed: Yes Static Sitting Balance Static Sitting - Balance Support: Feet supported;No upper extremity supported Static Sitting - Level of Assistance: 5: Stand by assistance Static Sitting - Comment/# of Minutes: 1 Static Standing Balance Static Standing - Balance Support: Bilateral upper extremity supported Static Standing - Level of Assistance: 5: Stand by assistance Static Standing - Comment/# of Minutes: 30 seconds prior to initiating ambulation  End of Session PT - End of Session Equipment Utilized During Treatment: Gait belt Activity Tolerance: Patient tolerated treatment well Patient left: in chair;with call bell/phone within reach;with family/visitor present Nurse Communication: Mobility status  GP Functional Assessment Tool Used: Clinical judgement Functional Limitation: Mobility: Walking and moving around Mobility: Walking and Moving Around Current Status [phone]): At least 40 percent but less than 60 percent impaired, limited or restricted Mobility: Walking and Moving Around Goal Status [phone]): At least 40 percent but less than 60 percent impaired, limited or restricted   Ruthann Cancer 11/09/2012, 4:32 PM  Ruthann Cancer, PT, DPT Acute Rehabilitation Services [phone]

## 2012-11-09 NOTE — Plan of Care (Signed)
Problem: Consults Goal: Diagnosis - Spinal Surgery Microdiscectomy: L3-L4

## 2012-11-10 ENCOUNTER — Encounter (HOSPITAL_COMMUNITY): Payer: Self-pay | Admitting: Orthopedic Surgery

## 2012-11-10 MED ORDER — OXYCODONE-ACETAMINOPHEN 7.5-325 MG PO TABS: 1.0000 | ORAL_TABLET | ORAL | Status: DC | PRN

## 2012-11-10 MED ORDER — DOCUSATE SODIUM 100 MG PO CAPS: 100.0000 mg | ORAL_CAPSULE | Freq: Two times a day (BID) | ORAL | Status: DC

## 2012-11-10 MED ORDER — METHOCARBAMOL 500 MG PO TABS: 500.0000 mg | ORAL_TABLET | Freq: Four times a day (QID) | ORAL | Status: DC | PRN

## 2012-11-10 MED ORDER — ONDANSETRON 4 MG PO TBDP
4.0000 mg | ORAL_TABLET | Freq: Three times a day (TID) | ORAL | Status: DC | PRN
Start: 2012-11-10 — End: 2014-06-04

## 2012-11-10 MED ORDER — POLYETHYLENE GLYCOL 3350 17 G PO PACK: 17.0000 g | PACK | Freq: Every day | ORAL | Status: DC

## 2012-11-10 NOTE — Op Note (Signed)
NAMEQUNISHA, BRYK NO.:  [id number]  MEDICAL RECORD NO.:  [id number]  LOCATION:  [id number]                        FACILITY:  [facility]  PHYSICIAN:  Alvy Beal, MD    DATE OF BIRTH:  08-06-1960  DATE OF PROCEDURE:  11/09/2012 DATE OF DISCHARGE:                              OPERATIVE REPORT   PREOPERATIVE DIAGNOSIS:  Far lateral L3-4 disk herniation.  POSTOPERATIVE DIAGNOSIS:  Far lateral L3-4 disk herniation.  OPERATIVE PROCEDURE:  Far lateral diskectomy through the Wiltse approach.  COMPLICATIONS:  None.  CONDITION:  Stable.  FIRST ASSISTANT:  Genene Churn. Barry Dienes, my PA.  HISTORY:  This is a very pleasant woman who has been having severe back, buttock, and right leg pain for sometime now.  MRI imaging demonstrated a far lateral disk herniation at L3-4.  Despite appropriate conservative management, she continued to decompensate and elected to proceed with surgery.  All appropriate risks, benefits, and alternatives were discussed with the patient and consent was obtained.  OPERATIVE NOTE:  The patient was brought to the operating room, placed supine on the operating table.  After successful induction of general anesthesia and endotracheal intubation, TEDs, SCDs were applied.  She was turned prone onto the [place] frame.  All bony prominences were well padded and the back was prepped and draped in a standard fashion.  Time- out was taken to confirm the patient, procedure, affected extremity, and all other pertinent important data.  Once this was confirmed, 2 needles were placed into the back and we took an x-ray to localize our incision. Once this was done, I infiltrated the incision site, centered around the L3-4 disk space.  A Wiltse approach was then carried out.  A 2-inch incision was made 1 fingerbreadth lateral to the midline on the right side and I sharply dissected down to the deep fascia.  I incised the deep fascia and then bluntly split the paraspinal  muscles with my finger until I could palpate the facet complex.  I then placed in the self- retaining Thompson retractor blades for exposure.  Once this was done, I mobilized the remaining paraspinal muscles to expose the lateral aspect of the L3 pars.  Once this was done, I placed a nerve hook underneath the L3 pars and took an intraoperative x-ray to confirm that I was at the appropriate level.  Once I confirmed that this was the L3 pars, I then used 3-mm Kerrison and 2-mm Kerrison to resect the lateral border of the pars.  I did not sacrifice much.  I then bluntly dissected until I found the L3 nerve root.  I then palpated and identified the L3 pedicle.  At this point, I then removed the ligamentum flavum inferiorly to expose the underlying L3-4 disk space.  Once this was done, I then used the nerve hooks to sweep circumferentially and deliver the fragmented disk material into the field.  I then removed 3 large fragments of disk material, consistent with what was seen on the preoperative MRI.  At this point, I obtained hemostasis using bipolar electrocautery.  I also identified the annular rent and used a micropituitary rongeur to enter the actual disk space to ensure that there  was no further loose fragments of disk material.  I then took a final x-ray again with a [facility], pointing at the disk space to confirm again that I was at the appropriate level and my dissection field corresponded with the preoperative MRI.  Once this was done, I irrigated the wound copiously with normal saline and again used my Woodson elevator to sweep inferiorly along the path of the L3 nerve root out past the foramen and medially along the disk space and in the lateral recess.  There was no further compression.  The nerve hook was able to freely pass without any tension.  Once I had hemostasis, I placed a thrombin-soaked Gelfoam patty over the exposed nerve root and then removed the retractor,  irrigated copiously with normal saline.  I then closed the incision with #1 Vicryl suture for the deep fascia, superficial with 2-0 Vicryl sutures, and a 3-0 Monocryl for the skin. Steri-Strips and a dry dressing were applied.  The patient was ultimately extubated and transferred to the PACU without incident.  At the end of the case, all needle and sponge counts were correct.  There were no adverse intraoperative events.     Alvy Beal, MD     DDB/MEDQ  D:  11/09/2012  T:  11/10/2012  Job:  [id number]

## 2012-11-10 NOTE — Progress Notes (Signed)
Physical Therapy Treatment Patient Details Name: [name] MRN: [id number] DOB: 1960/05/10 Today's Date: 11/10/2012 Time: 7829-5621 PT Time Calculation (min): 19 min  PT Assessment / Plan / Recommendation  History of Present Illness Pt is a 52 y/o female admitted s/p L3-4 decompression and microdiscectomy on 11/09/2012.   PT Comments   Pt moving very well.  Pt standing at sink in bathroom upon therapists' arrival.  Cues to correct/remind pt of back precautions as she was bending & twisting at sink while performing tasks.  But throughout session pt did not need any physical (A).   Pt safe to d/c home from mobility standpoint when MD feels medically ready.     Follow Up Recommendations  Home health PT     Does the patient have the potential to tolerate intense rehabilitation     Barriers to Discharge        Equipment Recommendations  Rolling walker with 5" wheels;3in1 (PT)    Recommendations for Other Services    Frequency Min 5X/week   Progress towards PT Goals Progress towards PT goals: Progressing toward goals  Plan Current plan remains appropriate    Precautions / Restrictions Precautions Precautions: Fall;Back Required Braces or Orthoses: Spinal Brace Spinal Brace: Applied in sitting position Restrictions Weight Bearing Restrictions: No       Mobility  Bed Mobility Bed Mobility: Rolling Left;Left Sidelying to Sit;Sit to Sidelying Left Rolling Left: 6: Modified independent (Device/Increase time) Left Sidelying to Sit: 6: Modified independent (Device/Increase time);HOB flat Sitting - Scoot to Edge of Bed: 6: Modified independent (Device/Increase time) Sit to Sidelying Left: 6: Modified independent (Device/Increase time);HOB flat Details for Bed Mobility Assistance: Pt demonstrates safe & proper technique.   Transfers Transfers: Sit to Stand;Stand to Sit Sit to Stand: 6: Modified independent (Device/Increase time);With upper extremity assist;From bed Stand to  Sit: 6: Modified independent (Device/Increase time);With upper extremity assist;With armrests;To bed;To chair/3-in-1 Ambulation/Gait Ambulation/Gait Assistance: 5: Supervision Ambulation Distance (Feet): 300 Feet Assistive device: Rolling walker Ambulation/Gait Assistance Details: cues to reinforce back precautions.   Gait Pattern: Step-through pattern Stairs: No Wheelchair Mobility Wheelchair Mobility: No    Exercises     PT Diagnosis:    PT Problem List:   PT Treatment Interventions:     PT Goals (current goals can now be found in the care plan section) Acute Rehab PT Goals PT Goal Formulation: With patient Time For Goal Achievement: 11/16/12 Potential to Achieve Goals: Good  Visit Information  Last PT Received On: 11/10/12 Assistance Needed: +1 History of Present Illness: Pt is a 52 y/o female admitted s/p L3-4 decompression and microdiscectomy on 11/09/2012.    Subjective Data      Cognition  Cognition Arousal/Alertness: Awake/alert Behavior During Therapy: WFL for tasks assessed/performed Overall Cognitive Status: Within Functional Limits for tasks assessed    Balance     End of Session PT - End of Session Equipment Utilized During Treatment: Back brace Activity Tolerance: Patient tolerated treatment well Patient left: in chair;with call bell/phone within reach Nurse Communication: Mobility status   GP     Lara Mulch 11/10/2012, 2:08 PM   Verdell Face, PTA [phone] 11/10/2012

## 2012-11-10 NOTE — Evaluation (Signed)
Occupational Therapy Evaluation and Discharge Summary Patient Details Name: [name] MRN: [id number] DOB: March 23, 1960 Today's Date: 11/10/2012 Time: 4098-1191 OT Time Calculation (min): 24 min  OT Assessment / Plan / Recommendation History of present illness Pt is a 52 y/o female admitted s/p L3-4 decompression and microdiscectomy on 11/09/2012.   Clinical Impression   Pt admitted for above diagnosis and is doing great with adls and following back precautions.  Pt does not have further OT needs.  All education complete.    OT Assessment  Patient does not need any further OT services    Follow Up Recommendations  No OT follow up    Barriers to Discharge      Equipment Recommendations  3 in 1 bedside comode    Recommendations for Other Services    Frequency       Precautions / Restrictions Precautions Precautions: Fall;Back Precaution Booklet Issued: Yes (comment) Required Braces or Orthoses: Spinal Brace Spinal Brace: Applied in sitting position Restrictions Weight Bearing Restrictions: No   Pertinent Vitals/Pain Pt with 6/10 back pain.  Nursing aware.    ADL  Eating/Feeding: Performed;Independent Where Assessed - Eating/Feeding: Chair Grooming: Performed;Set up Where Assessed - Grooming: Unsupported standing Upper Body Bathing: Performed;Set up Where Assessed - Upper Body Bathing: Unsupported standing Lower Body Bathing: Performed;Supervision/safety Where Assessed - Lower Body Bathing: Unsupported sit to stand Upper Body Dressing: Performed;Set up Where Assessed - Upper Body Dressing: Unsupported sitting Lower Body Dressing: Performed;Set up Where Assessed - Lower Body Dressing: Unsupported sit to stand Toilet Transfer: Performed;Supervision/safety Toilet Transfer Method: Other (comment) (walked to bathroom) Toilet Transfer Equipment: Comfort height toilet;Grab bars Toileting - Clothing Manipulation and Hygiene: Performed;Supervision/safety Where Assessed  - Engineer, mining and Hygiene: Standing Equipment Used: Rolling walker Transfers/Ambulation Related to ADLs: Pt walked in room and hallway with walker and S. ADL Comments: pt completes all adls with S to mod I with cues for back precautions.    OT Diagnosis:    OT Problem List:   OT Treatment Interventions:     OT Goals(Current goals can be found in the care plan section) Acute Rehab OT Goals Patient Stated Goal: To return home with son  Visit Information  Last OT Received On: 11/10/12 Assistance Needed: +1 History of Present Illness: Pt is a 52 y/o female admitted s/p L3-4 decompression and microdiscectomy on 11/09/2012.       Prior Functioning     Home Living Family/patient expects to be discharged to:: Private residence Living Arrangements: Children Available Help at Discharge: Family;Available PRN/intermittently Type of Home: House Home Access: Level entry Home Layout: One level Home Equipment: None Prior Function Level of Independence: Independent Communication Communication: No difficulties Dominant Hand: Right         Vision/Perception     Cognition  Cognition Arousal/Alertness: Awake/alert Behavior During Therapy: WFL for tasks assessed/performed Overall Cognitive Status: Within Functional Limits for tasks assessed    Extremity/Trunk Assessment Upper Extremity Assessment Upper Extremity Assessment: Overall WFL for tasks assessed Lower Extremity Assessment Lower Extremity Assessment: Defer to PT evaluation Cervical / Trunk Assessment Cervical / Trunk Assessment: Normal     Mobility Bed Mobility Bed Mobility: Not assessed Transfers Transfers: Sit to Stand;Stand to Sit Sit to Stand: 5: Supervision;From chair/3-in-1 Stand to Sit: 5: Supervision;To chair/3-in-1 Details for Transfer Assistance: Did well today maintaining back precautions.     Exercise     Balance Balance Balance Assessed: Yes Static Sitting Balance Static  Sitting - Balance Support: Feet supported;No upper extremity supported  Static Sitting - Level of Assistance: 5: Stand by assistance Static Sitting - Comment/# of Minutes: 5 Static Standing Balance Static Standing - Balance Support: Bilateral upper extremity supported Static Standing - Level of Assistance: 5: Stand by assistance Static Standing - Comment/# of Minutes: 5 at sink   End of Session OT - End of Session Equipment Utilized During Treatment: Rolling walker;Back brace Activity Tolerance: Patient tolerated treatment well Patient left: in chair;with call bell/phone within reach Nurse Communication: Mobility status  GO Functional Assessment Tool Used: clinical judgement Functional Limitation: Self care Self Care Current Status (W0981): At least 1 percent but less than 20 percent impaired, limited or restricted Self Care Goal Status ([id number]): At least 1 percent but less than 20 percent impaired, limited or restricted Self Care Discharge Status [phone]): At least 1 percent but less than 20 percent impaired, limited or restricted   [name] 11/10/2012, 10:03 AM [phone]

## 2012-11-10 NOTE — Progress Notes (Signed)
Subjective: Doing well.  Pain controlled.     Objective: Vital signs in last 24 hours: Temp:  [98.2 F (36.8 C)-99 F (37.2 C)] 99 F (37.2 C) (10/31 1351) Pulse Rate:  [60-68] 68 (10/31 1351) Resp:  [18] 18 (10/31 1351) BP: (122-128)/(64-74) 128/74 mmHg (10/31 1351) SpO2:  [97 %-100 %] 97 % (10/31 1351) Weight:  [91.173 kg (201 lb)] 91.173 kg (201 lb) (10/31 0900)  Intake/Output from previous day: 10/30 0701 - 10/31 0700 In: 2040 [P.O.:240; I.V.:1800] Out: 400 [Urine:350; Blood:50] Intake/Output this shift: Total I/O In: 1020 [P.O.:720; IV Piggyback:300] Out: -   No results found for this basename: HGB,  in the last 72 hours No results found for this basename: WBC, RBC, HCT, PLT,  in the last 72 hours No results found for this basename: NA, K, CL, CO2, BUN, CREATININE, GLUCOSE, CALCIUM,  in the last 72 hours No results found for this basename: LABPT, INR,  in the last 72 hours  Neurologically intact Dorsiflexion/Plantar flexion intact  Assessment/Plan: D/c home tomorrow.  F/u 2 weeks postop   Jadelyn Elks M 11/10/2012, 2:13 PM

## 2012-11-10 NOTE — Progress Notes (Signed)
   CARE MANAGEMENT NOTE 11/10/2012  Patient:  [name], [name]   Account Number:  [id number]  Date Initiated:  11/10/2012  Documentation initiated by:  [facility]  Subjective/Objective Assessment:   s/p L3-4 decompression and microdiscectomy on 11/09/2012.     Action/Plan:   lives at home with son, Emogene Morgan   Anticipated DC Date:  11/11/2012   Anticipated DC Plan:  HOME W HOME HEALTH SERVICES      DC Planning Services  CM consult      Choice offered to / List presented to:             Status of service:  In process, will continue to follow Medicare Important Message given?   (If response is "NO", the following Medicare IM given date fields will be blank) Date Medicare IM given:   Date Additional Medicare IM given:    Discharge Disposition:    Per UR Regulation:    If discussed at Long Length of Stay Meetings, dates discussed:    Comments:  11/10/2012 1400 NCM spoke to [employer], [employer]. Elvera Bicker [phone] fax [phone]. States she will arrange [facility] and DME. Received call from [facility] with [facility] arranging [facility]. Requested paperwork be faxed to her office. Received call from [place] from [facility] arranging DME. Requested paperwork be faxed to her office. She will call [facility] to give auth/approval for them to deliver DME. Faxed paperwork to [place], [employer] [place] and [place] Isidoro Donning RN CCM Case Mgmt phone [phone]  11/10/2012 1200 NCM spoke to pt and she provided information on [employer] # [phone]. Left message on voicemail. Isidoro Donning RN CCM Case Mgmt phone [phone]

## 2012-11-11 NOTE — Progress Notes (Signed)
Subjective: 2 Days Post-Op Procedure(s) (LRB): L3-L4 DECOMPRESSION AND MICRODISCECTOMY   (1 LEVEL) (N/A) Patient reports pain as mild.    Objective: Vital signs in last 24 hours: Temp:  [98.4 F (36.9 C)-99 F (37.2 C)] 98.6 F (37 C) (11/01 0618) Pulse Rate:  [63-68] 63 (11/01 0618) Resp:  [18] 18 (11/01 0618) BP: (128-173)/(73-93) 172/82 mmHg (11/01 0630) SpO2:  [97 %-98 %] 98 % (11/01 0618)  Intake/Output from previous day: 10/31 0701 - 11/01 0700 In: 1020 [P.O.:720; IV Piggyback:300] Out: -  Intake/Output this shift:    No results found for this basename: HGB,  in the last 72 hours No results found for this basename: WBC, RBC, HCT, PLT,  in the last 72 hours No results found for this basename: NA, K, CL, CO2, BUN, CREATININE, GLUCOSE, CALCIUM,  in the last 72 hours No results found for this basename: LABPT, INR,  in the last 72 hours  PE:  wn wd female in nad.  Dressings c/d/i.  Assessment/Plan: 2 Days Post-Op Procedure(s) (LRB): L3-L4 DECOMPRESSION AND MICRODISCECTOMY   (1 LEVEL) (N/A) D/c home today.  HEWITTJonny Ruiz 11/11/2012, 10:01 AM

## 2012-11-11 NOTE — Progress Notes (Addendum)
   CARE MANAGEMENT NOTE 11/11/2012  Patient:  [name], [name]   Account Number:  [id number]  Date Initiated:  11/10/2012  Documentation initiated by:  [facility]  Subjective/Objective Assessment:   s/p L3-4 decompression and microdiscectomy on 11/09/2012.     Action/Plan:   lives at home with son, [name]   Anticipated DC Date:  11/11/2012   Anticipated DC Plan:  HOME W HOME HEALTH SERVICES      DC Planning Services  CM consult      Choice offered to / List presented to:     DME arranged  3-N-1  Levan Hurst      DME agency  [facility]        Status of service:  Completed, signed off Medicare Important Message given?   (If response is "NO", the following Medicare IM given date fields will be blank) Date Medicare IM given:   Date Additional Medicare IM given:    Discharge Disposition:  HOME W HOME HEALTH SERVICES  Per UR Regulation:    If discussed at Long Length of Stay Meetings, dates discussed:    Comments:  11/11/2012 1030 NCM notified [facility] DME for delivery of equipment for possible dc home today.  Faxed HH orders to [place] with [facility] to arrange [facility] PT. Isidoro Donning RN CCM Case Mgmt phone [phone]  11/10/2012 1400 NCM spoke to [employer], [employer]. Elvera Bicker [phone] fax [phone]. States she will arrange [facility] and DME. Received call from [facility] with [facility] arranging [facility]. Requested paperwork be faxed to her office. Received call from [place] from [facility] arranging DME. Requested paperwork be faxed to her office. She will call [facility] to give auth/approval for them to deliver DME. Faxed paperwork to [place], [employer] [place] and [place] Isidoro Donning RN CCM Case Mgmt phone [phone]  11/10/2012 1200 NCM spoke to pt and she provided information on [employer] # [phone]. Left message on voicemail. Isidoro Donning RN CCM Case Mgmt phone [phone]

## 2012-11-11 NOTE — Progress Notes (Signed)
Physical Therapy Treatment Patient Details Name: [name] MRN: [id number] DOB: 06-Sep-1960 Today's Date: 11/11/2012 Time: 4098-1191 PT Time Calculation (min): 16 min  PT Assessment / Plan / Recommendation  History of Present Illness Pt is a 52 y/o female admitted s/p L3-4 decompression and microdiscectomy on 11/09/2012.   PT Comments   Pt moving very well.  Was able to ambulate without use of RW & did well however pt states she would feel more comfortable with still receiving RW for home use to be on the safe side.     Follow Up Recommendations  Home health PT     Does the patient have the potential to tolerate intense rehabilitation     Barriers to Discharge        Equipment Recommendations  Rolling walker with 5" wheels;3in1 (PT)    Recommendations for Other Services    Frequency Min 5X/week   Progress towards PT Goals Progress towards PT goals: Progressing toward goals  Plan Current plan remains appropriate    Precautions / Restrictions Precautions Precautions: Fall;Back Required Braces or Orthoses: Spinal Brace Spinal Brace: Applied in sitting position   Pertinent Vitals/Pain BP:  154/101 at beginning of session         186/97 after ambulating.    **RN made aware & she ok'd to proceed with therapy**    Mobility  Bed Mobility Bed Mobility: Not assessed Transfers Transfers: Sit to Stand;Stand to Sit Sit to Stand: 6: Modified independent (Device/Increase time);With upper extremity assist;With armrests;From chair/3-in-1 Stand to Sit: 6: Modified independent (Device/Increase time);With upper extremity assist;With armrests;To chair/3-in-1 Ambulation/Gait Ambulation/Gait Assistance: 5: Supervision Ambulation Distance (Feet): 300 Feet Assistive device: Rolling walker;None Ambulation/Gait Assistance Details: Initiated gait training with RW but then trialed without due to pt bearing very minimal weight through UE's.  Pt steady without RW however pt does report after  ambulating that she prefers to still have RW for home use just in case she needs it.   Gait Pattern: Step-through pattern Stairs: No Wheelchair Mobility Wheelchair Mobility: No      PT Goals (current goals can now be found in the care plan section) Acute Rehab PT Goals PT Goal Formulation: With patient Time For Goal Achievement: 11/16/12 Potential to Achieve Goals: Good  Visit Information  Last PT Received On: 11/11/12 Assistance Needed: +1 History of Present Illness: Pt is a 52 y/o female admitted s/p L3-4 decompression and microdiscectomy on 11/09/2012.    Subjective Data      Cognition  Cognition Arousal/Alertness: Awake/alert Behavior During Therapy: WFL for tasks assessed/performed Overall Cognitive Status: Within Functional Limits for tasks assessed    Balance     End of Session PT - End of Session Equipment Utilized During Treatment: Back brace Activity Tolerance: Patient tolerated treatment well Patient left: in chair;with call bell/phone within reach Nurse Communication: Mobility status;Other (comment) (BP)   GP     Lara Mulch 11/11/2012, 10:24 AM  Verdell Face, PTA [phone] 11/11/2012
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
## 2012-11-23 NOTE — Discharge Summary (Signed)
Agree with above 

## 2013-01-23 ENCOUNTER — Other Ambulatory Visit: Payer: Self-pay

## 2013-01-23 DIAGNOSIS — Z1231 Encounter for screening mammogram for malignant neoplasm of breast: Secondary | ICD-10-CM

## 2013-02-09 ENCOUNTER — Ambulatory Visit
Admission: RE | Admit: 2013-02-09 | Discharge: 2013-02-09 | Disposition: A | Discharge status: Home / Self Care | Payer: BC Managed Care – PPO | Source: Ambulatory Visit

## 2013-02-09 DIAGNOSIS — Z1231 Encounter for screening mammogram for malignant neoplasm of breast: Secondary | ICD-10-CM

## 2013-02-13 ENCOUNTER — Other Ambulatory Visit: Payer: Self-pay | Admitting: Obstetrics and Gynecology

## 2013-02-13 DIAGNOSIS — R928 Other abnormal and inconclusive findings on diagnostic imaging of breast: Secondary | ICD-10-CM

## 2013-02-20 ENCOUNTER — Ambulatory Visit
Admission: RE | Admit: 2013-02-20 | Discharge: 2013-02-20 | Disposition: A | Discharge status: Home / Self Care | Payer: BC Managed Care – PPO | Source: Ambulatory Visit | Attending: Obstetrics and Gynecology | Admitting: Obstetrics and Gynecology

## 2013-02-20 DIAGNOSIS — R928 Other abnormal and inconclusive findings on diagnostic imaging of breast: Secondary | ICD-10-CM

## 2013-06-06 ENCOUNTER — Encounter: Payer: Self-pay | Admitting: Cardiovascular Disease

## 2014-03-08 ENCOUNTER — Other Ambulatory Visit: Payer: Self-pay

## 2014-03-08 DIAGNOSIS — Z1231 Encounter for screening mammogram for malignant neoplasm of breast: Secondary | ICD-10-CM

## 2014-03-27 ENCOUNTER — Ambulatory Visit
Admission: RE | Admit: 2014-03-27 | Discharge: 2014-03-27 | Disposition: A | Discharge status: Home / Self Care | Payer: BLUE CROSS/BLUE SHIELD | Source: Ambulatory Visit

## 2014-03-27 ENCOUNTER — Encounter (INDEPENDENT_AMBULATORY_CARE_PROVIDER_SITE_OTHER): Payer: Self-pay

## 2014-03-27 DIAGNOSIS — Z1231 Encounter for screening mammogram for malignant neoplasm of breast: Secondary | ICD-10-CM

## 2014-06-04 ENCOUNTER — Emergency Department (HOSPITAL_COMMUNITY)
Admission: EM | Admit: 2014-06-04 | Discharge: 2014-06-04 | Disposition: A | Discharge status: Home / Self Care | Payer: BLUE CROSS/BLUE SHIELD | Attending: Emergency Medicine | Admitting: Emergency Medicine

## 2014-06-04 ENCOUNTER — Encounter (HOSPITAL_COMMUNITY): Payer: Self-pay | Admitting: Cardiology

## 2014-06-04 ENCOUNTER — Emergency Department (HOSPITAL_COMMUNITY): Payer: BLUE CROSS/BLUE SHIELD

## 2014-06-04 ENCOUNTER — Emergency Department (HOSPITAL_COMMUNITY)
Admission: EM | Admit: 2014-06-04 | Discharge: 2014-06-04 | Discharge status: Against Medical Advice | Payer: BLUE CROSS/BLUE SHIELD | Attending: Emergency Medicine | Admitting: Emergency Medicine

## 2014-06-04 ENCOUNTER — Encounter (HOSPITAL_COMMUNITY): Payer: Self-pay

## 2014-06-04 DIAGNOSIS — G8929 Other chronic pain: Secondary | ICD-10-CM | POA: Diagnosis not present

## 2014-06-04 DIAGNOSIS — Z8659 Personal history of other mental and behavioral disorders: Secondary | ICD-10-CM | POA: Diagnosis not present

## 2014-06-04 DIAGNOSIS — S3991XA Unspecified injury of abdomen, initial encounter: Secondary | ICD-10-CM | POA: Insufficient documentation

## 2014-06-04 DIAGNOSIS — Y92410 Unspecified street and highway as the place of occurrence of the external cause: Secondary | ICD-10-CM | POA: Insufficient documentation

## 2014-06-04 DIAGNOSIS — S299XXA Unspecified injury of thorax, initial encounter: Secondary | ICD-10-CM | POA: Insufficient documentation

## 2014-06-04 DIAGNOSIS — S0990XA Unspecified injury of head, initial encounter: Secondary | ICD-10-CM | POA: Insufficient documentation

## 2014-06-04 DIAGNOSIS — S161XXA Strain of muscle, fascia and tendon at neck level, initial encounter: Secondary | ICD-10-CM | POA: Diagnosis not present

## 2014-06-04 DIAGNOSIS — Y9389 Activity, other specified: Secondary | ICD-10-CM | POA: Insufficient documentation

## 2014-06-04 DIAGNOSIS — Y998 Other external cause status: Secondary | ICD-10-CM | POA: Insufficient documentation

## 2014-06-04 DIAGNOSIS — S3992XA Unspecified injury of lower back, initial encounter: Secondary | ICD-10-CM | POA: Diagnosis not present

## 2014-06-04 DIAGNOSIS — M5431 Sciatica, right side: Secondary | ICD-10-CM | POA: Diagnosis not present

## 2014-06-04 DIAGNOSIS — I10 Essential (primary) hypertension: Secondary | ICD-10-CM | POA: Diagnosis not present

## 2014-06-04 DIAGNOSIS — Z8709 Personal history of other diseases of the respiratory system: Secondary | ICD-10-CM | POA: Diagnosis not present

## 2014-06-04 DIAGNOSIS — S199XXA Unspecified injury of neck, initial encounter: Secondary | ICD-10-CM | POA: Diagnosis present

## 2014-06-04 DIAGNOSIS — Y999 Unspecified external cause status: Secondary | ICD-10-CM | POA: Diagnosis not present

## 2014-06-04 DIAGNOSIS — Z79899 Other long term (current) drug therapy: Secondary | ICD-10-CM | POA: Diagnosis not present

## 2014-06-04 DIAGNOSIS — S39012A Strain of muscle, fascia and tendon of lower back, initial encounter: Secondary | ICD-10-CM

## 2014-06-04 DIAGNOSIS — Z8639 Personal history of other endocrine, nutritional and metabolic disease: Secondary | ICD-10-CM | POA: Insufficient documentation

## 2014-06-04 DIAGNOSIS — M1990 Unspecified osteoarthritis, unspecified site: Secondary | ICD-10-CM | POA: Insufficient documentation

## 2014-06-04 LAB — URINALYSIS, ROUTINE W REFLEX MICROSCOPIC
Bilirubin Urine: NEGATIVE
Glucose, UA: NEGATIVE mg/dL
Hgb urine dipstick: NEGATIVE
Ketones, ur: NEGATIVE mg/dL
Leukocytes, UA: NEGATIVE
Nitrite: NEGATIVE
Protein, ur: NEGATIVE mg/dL
Specific Gravity, Urine: 1.007 (ref 1.005–1.030)
Urobilinogen, UA: 1 mg/dL (ref 0.0–1.0)
pH: 6.5 (ref 5.0–8.0)

## 2014-06-04 LAB — BASIC METABOLIC PANEL
Anion gap: 10 (ref 5–15)
BUN: 7 mg/dL (ref 6–20)
CO2: 27 mmol/L (ref 22–32)
Calcium: 9.5 mg/dL (ref 8.9–10.3)
Chloride: 101 mmol/L (ref 101–111)
Creatinine, Ser: 0.83 mg/dL (ref 0.44–1.00)
GFR calc Af Amer: >60 mL/min (ref >60)
GFR calc non Af Amer: >60 mL/min (ref >60)
Glucose, Bld: 92 mg/dL (ref 65–99)
Potassium: 3.6 mmol/L (ref 3.5–5.1)
Sodium: 138 mmol/L (ref 135–145)

## 2014-06-04 LAB — CBC
HCT: 36.6 % (ref 36.0–46.0)
Hemoglobin: 12 g/dL (ref 12.0–15.0)
MCH: 28.3 pg (ref 26.0–34.0)
MCHC: 32.8 g/dL (ref 30.0–36.0)
MCV: 86.3 fL (ref 78.0–100.0)
Platelets: 302 10*3/uL (ref 150–400)
RBC: 4.24 MIL/uL (ref 3.87–5.11)
RDW: 13.5 % (ref 11.5–15.5)
WBC: 8.5 10*3/uL (ref 4.0–10.5)

## 2014-06-04 LAB — I-STAT TROPONIN, ED: Troponin i, poc: 0 ng/mL (ref 0.00–0.08)
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
## 2014-06-04 MED ORDER — ORPHENADRINE CITRATE ER 100 MG PO TB12
100.0000 mg | ORAL_TABLET | Freq: Two times a day (BID) | ORAL | Status: DC
Start: 2014-06-04 — End: 2015-02-06

## 2014-06-04 MED ORDER — IBUPROFEN 800 MG PO TABS
800.0000 mg | ORAL_TABLET | Freq: Once | ORAL | Status: AC
  Administered 2014-06-04: 800 mg via ORAL
  Filled 2014-06-04: qty 1

## 2014-06-04 MED ORDER — HYDROCODONE-ACETAMINOPHEN 5-325 MG PO TABS
1.0000 | ORAL_TABLET | Freq: Once | ORAL | Status: AC
  Administered 2014-06-04: 1 via ORAL
  Filled 2014-06-04: qty 1

## 2014-06-04 MED ORDER — CYCLOBENZAPRINE HCL 10 MG PO TABS
5.0000 mg | ORAL_TABLET | Freq: Once | ORAL | Status: AC
  Administered 2014-06-04: 5 mg via ORAL
  Filled 2014-06-04: qty 1

## 2014-06-04 MED ORDER — HYDROCODONE-ACETAMINOPHEN 5-325 MG PO TABS: 1.0000 | ORAL_TABLET | ORAL | Status: DC | PRN

## 2014-06-04 MED ORDER — IBUPROFEN 600 MG PO TABS
600.0000 mg | ORAL_TABLET | Freq: Four times a day (QID) | ORAL | Status: DC | PRN
Start: 2014-06-04 — End: 2016-06-30

## 2014-06-04 NOTE — ED Notes (Signed)
The pt told ED staff she was leaving to go to [facility] now.
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
## 2014-06-06 ENCOUNTER — Encounter (HOSPITAL_BASED_OUTPATIENT_CLINIC_OR_DEPARTMENT_OTHER): Payer: Self-pay | Admitting: Emergency Medicine

## 2015-02-06 ENCOUNTER — Emergency Department (HOSPITAL_COMMUNITY): Payer: No Typology Code available for payment source

## 2015-02-06 ENCOUNTER — Emergency Department (HOSPITAL_COMMUNITY)
Admission: EM | Admit: 2015-02-06 | Discharge: 2015-02-06 | Disposition: A | Discharge status: Home / Self Care | Payer: No Typology Code available for payment source | Attending: Emergency Medicine | Admitting: Emergency Medicine

## 2015-02-06 ENCOUNTER — Encounter (HOSPITAL_COMMUNITY): Payer: Self-pay | Admitting: *Deleted

## 2015-02-06 DIAGNOSIS — S0990XA Unspecified injury of head, initial encounter: Secondary | ICD-10-CM | POA: Diagnosis not present

## 2015-02-06 DIAGNOSIS — Z8709 Personal history of other diseases of the respiratory system: Secondary | ICD-10-CM | POA: Diagnosis not present

## 2015-02-06 DIAGNOSIS — Z8659 Personal history of other mental and behavioral disorders: Secondary | ICD-10-CM | POA: Diagnosis not present

## 2015-02-06 DIAGNOSIS — S199XXA Unspecified injury of neck, initial encounter: Secondary | ICD-10-CM | POA: Diagnosis present

## 2015-02-06 DIAGNOSIS — Y92410 Unspecified street and highway as the place of occurrence of the external cause: Secondary | ICD-10-CM | POA: Diagnosis not present

## 2015-02-06 DIAGNOSIS — S6991XA Unspecified injury of right wrist, hand and finger(s), initial encounter: Secondary | ICD-10-CM | POA: Diagnosis not present

## 2015-02-06 DIAGNOSIS — S161XXA Strain of muscle, fascia and tendon at neck level, initial encounter: Secondary | ICD-10-CM

## 2015-02-06 DIAGNOSIS — T07XXXA Unspecified multiple injuries, initial encounter: Secondary | ICD-10-CM

## 2015-02-06 DIAGNOSIS — Y9389 Activity, other specified: Secondary | ICD-10-CM | POA: Insufficient documentation

## 2015-02-06 DIAGNOSIS — I10 Essential (primary) hypertension: Secondary | ICD-10-CM | POA: Diagnosis not present

## 2015-02-06 DIAGNOSIS — G8929 Other chronic pain: Secondary | ICD-10-CM | POA: Insufficient documentation

## 2015-02-06 DIAGNOSIS — T148 Other injury of unspecified body region: Secondary | ICD-10-CM | POA: Diagnosis not present

## 2015-02-06 DIAGNOSIS — E559 Vitamin D deficiency, unspecified: Secondary | ICD-10-CM | POA: Diagnosis not present

## 2015-02-06 DIAGNOSIS — S8992XA Unspecified injury of left lower leg, initial encounter: Secondary | ICD-10-CM | POA: Insufficient documentation

## 2015-02-06 DIAGNOSIS — Y998 Other external cause status: Secondary | ICD-10-CM | POA: Diagnosis not present

## 2015-02-06 DIAGNOSIS — S299XXA Unspecified injury of thorax, initial encounter: Secondary | ICD-10-CM | POA: Diagnosis not present

## 2015-02-06 DIAGNOSIS — M1990 Unspecified osteoarthritis, unspecified site: Secondary | ICD-10-CM | POA: Diagnosis not present
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
## 2015-02-06 MED ORDER — METHOCARBAMOL 500 MG PO TABS: 500.0000 mg | ORAL_TABLET | Freq: Three times a day (TID) | ORAL | Status: DC | PRN

## 2015-02-06 MED ORDER — ONDANSETRON 4 MG PO TBDP
4.0000 mg | ORAL_TABLET | Freq: Once | ORAL | Status: AC
  Administered 2015-02-06: 4 mg via ORAL
  Filled 2015-02-06: qty 1

## 2015-02-06 MED ORDER — HYDROCODONE-ACETAMINOPHEN 5-325 MG PO TABS: 1.0000 | ORAL_TABLET | ORAL | Status: DC | PRN

## 2015-02-06 MED ORDER — NAPROXEN 500 MG PO TABS: 500.0000 mg | ORAL_TABLET | Freq: Two times a day (BID) | ORAL | Status: DC

## 2015-02-06 MED ORDER — HYDROCODONE-ACETAMINOPHEN 5-325 MG PO TABS
1.0000 | ORAL_TABLET | Freq: Once | ORAL | Status: AC
  Administered 2015-02-06: 1 via ORAL
  Filled 2015-02-06: qty 1

## 2015-02-06 MED ORDER — OLMESARTAN MEDOXOMIL-HCTZ 20-12.5 MG PO TABS: 1.0000 | ORAL_TABLET | Freq: Every day | ORAL | Status: DC
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
## 2015-02-06 NOTE — Discharge Instructions (Signed)
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
## 2015-08-19 ENCOUNTER — Other Ambulatory Visit: Payer: Self-pay | Admitting: Internal Medicine

## 2015-08-19 DIAGNOSIS — Z1231 Encounter for screening mammogram for malignant neoplasm of breast: Secondary | ICD-10-CM

## 2015-09-01 ENCOUNTER — Ambulatory Visit
Admission: RE | Admit: 2015-09-01 | Discharge: 2015-09-01 | Disposition: A | Discharge status: Home / Self Care | Payer: 59 | Source: Ambulatory Visit | Attending: Internal Medicine | Admitting: Internal Medicine

## 2015-09-01 DIAGNOSIS — Z1231 Encounter for screening mammogram for malignant neoplasm of breast: Secondary | ICD-10-CM

## 2016-06-30 ENCOUNTER — Ambulatory Visit (INDEPENDENT_AMBULATORY_CARE_PROVIDER_SITE_OTHER): Payer: 59 | Admitting: Cardiovascular Disease

## 2016-06-30 ENCOUNTER — Encounter: Payer: Self-pay | Admitting: Cardiovascular Disease

## 2016-06-30 VITALS — BP 162/90 | HR 80 | Ht 61.0 in | Wt 191.0 lb

## 2016-06-30 DIAGNOSIS — E038 Other specified hypothyroidism: Secondary | ICD-10-CM | POA: Diagnosis not present

## 2016-06-30 DIAGNOSIS — I10 Essential (primary) hypertension: Secondary | ICD-10-CM | POA: Diagnosis not present

## 2016-06-30 DIAGNOSIS — R0789 Other chest pain: Secondary | ICD-10-CM | POA: Diagnosis not present

## 2016-06-30 DIAGNOSIS — R079 Chest pain, unspecified: Secondary | ICD-10-CM

## 2016-06-30 DIAGNOSIS — R011 Cardiac murmur, unspecified: Secondary | ICD-10-CM | POA: Diagnosis not present

## 2016-06-30 DIAGNOSIS — Z8249 Family history of ischemic heart disease and other diseases of the circulatory system: Secondary | ICD-10-CM | POA: Diagnosis not present

## 2016-06-30 DIAGNOSIS — E039 Hypothyroidism, unspecified: Secondary | ICD-10-CM | POA: Insufficient documentation

## 2016-06-30 HISTORY — DX: Other chest pain: R07.89

## 2016-06-30 HISTORY — DX: Hypothyroidism, unspecified: E03.9

## 2016-06-30 NOTE — Patient Instructions (Addendum)
Medication Instructions:  Your physician recommends that you continue on your current medications as directed. Please refer to the Current Medication list given to you today.  Labwork: NONE  Testing/Procedures: Your physician has requested that you have an echocardiogram. Echocardiography is a painless test that uses sound waves to create images of your heart. It provides your doctor with information about the size and shape of your heart and how well your heart's chambers and valves are working. This procedure takes approximately one hour. There are no restrictions for this procedure. [facility] AT [address]  Your physician has requested that you have en exercise stress myoview. For further information please visit [email or web address]. Please follow instruction sheet, as given.  Follow-Up: Your physician recommends that you schedule a follow-up appointment in: 1 MONTH OV  If you need a refill on your cardiac medications before your next appointment, please call your pharmacy.   

## 2016-06-30 NOTE — Progress Notes (Signed)
Cardiology Office Note   Date:  06/30/2016   ID:  Rebekah HeadlandBlanche R Bruneau, DOB [birth year], MRN [id number]  PCP:  Dorothyann PengSanders, Robyn, MD  Cardiologist:   Chilton Siiffany [place], MD   No chief complaint on file.     History of Present Illness: [name] is a 56 y.o. female hypertension who presents for evaluation of an abnormal EKG. Ms. Thomos LemonsBurrow saw Dr. Allyne GeeSanders and was noted to have abnormalities on her EKG. Those records are not available at this time.  She also has a family history of CAD.  She was referred to cardiology for further evaluation.  Her sister died of a heart attack at age 56 without any preceding symptoms.  She also has a family history of a congenital heart disease, though she is unable to recall the name. Her mother, aunt, and 2 cousins all have this disease. Her mother had a heart attack and passed away last night. Prior to this she had a massive stroke and was planning to be transitioned to hospice. However she died before this took place.  Ms. Thomos LemonsBurrow reports intermittent episodes of left-sided chest pain. This typically occurs when she is laying down. She denies any exertional symptoms. She also has exertional shortness of breath but denies orthopnea or PND. She occasionally has swelling in the left leg especially after standing for prolonged periods of time. She endorses feeling a sharp pain in her left side when she takes a deep breath. She notes occasional palpitations that last for a few seconds at a time. Happens when she is sitting at her desk at work. She endorses episodes of transient blurry vision that sometimes occurs while driving. She has to pull over to side. She denies syncope.  She does not get much formal exercise but is active.  She notes exertional fatigue.    Past Medical History:  Diagnosis Date  . Allergy   . Arthritis    back   . Atypical chest pain 06/30/2016  . Back pain    DUE TO INJURY AT WORK ON05/2013  . Bruises easily   . Chest pain 06/30/2016  .  Chronic lower back pain   . Depression    was on meds 2 yrs ago   . Essential hypertension 06/30/2016  . Headache(784.0)    rarely  . History of bronchitis    last time about 6961yrs ago   . Hypertension    takes Benicar daily  . Hypothyroidism 06/30/2016  . Vitamin D deficiency    takes Vitamin D 2 times/wk  . Weakness    and numbness right foot    Past Surgical History:  Procedure Laterality Date  . BACK SURGERY    . CARPAL TUNNEL RELEASE Right   . CESAREAN SECTION  1988  . COLONOSCOPY    . EXPLORATORY LAPAROTOMY  1991   "took out fatty tumor" (11/09/2012)  . LUMBAR LAMINECTOMY/DECOMPRESSION MICRODISCECTOMY  11/09/2012   "L3-4" (11/09/2012)  . LUMBAR LAMINECTOMY/DECOMPRESSION MICRODISCECTOMY N/A 11/09/2012   Procedure: L3-L4 DECOMPRESSION AND MICRODISCECTOMY   (1 LEVEL);  Surgeon: Venita Lickahari Brooks, MD;  [facility];  Service: Orthopedics;  Laterality: N/A;     Current Outpatient Prescriptions  Medication Sig Dispense Refill  . levocetirizine (XYZAL) 5 MG tablet Take 5 mg by mouth every evening.    . Lorcaserin HCl (BELVIQ) 10 MG TABS Take 1 tablet by mouth 2 (two) times daily.    . Multiple Vitamins-Minerals (WOMENS MULTIVITAMIN PO) Take 1 capsule by mouth daily.    .Marland Kitchen  olmesartan-hydrochlorothiazide (BENICAR HCT) 20-12.5 MG tablet Take 1 tablet by mouth daily. 30 tablet 0   No current facility-administered medications for this visit.     Allergies:   Patient has no known allergies.    Social History:  The patient  reports that she has never smoked. She has never used smokeless tobacco. She reports that she drinks about 4.2 oz of alcohol per week . She reports that she does not use drugs.   Family History:  The patient's family history includes CAD in her mother; Heart disease in her mother and sister; Liver disease in her brother; Stroke in her mother.    ROS:  Please see the history of present illness.   Otherwise, review of systems are positive for headaches, red  eyes.   All other systems are reviewed and negative.    PHYSICAL EXAM: VS:  BP (!) 162/90   Pulse 80   Ht 5\' 1"  (1.549 m)   Wt 86.6 kg (191 lb)   LMP 06/03/2011   SpO2 97%   BMI 36.09 kg/m  , BMI Body mass index is 36.09 kg/m. GENERAL:  Well appearing.  Tearful HEENT:  Pupils equal round and reactive, fundi not visualized, oral mucosa unremarkable NECK:  No jugular venous distention, waveform within normal limits, carotid upstroke brisk and symmetric, no bruits, no thyromegaly LYMPHATICS:  No cervical adenopathy LUNGS:  Clear to auscultation bilaterally.  No crackles, wheezes or rhonchi. HEART:  RRR.  PMI not displaced or sustained,S1 and S2 within normal limits, no S3, no S4, no clicks, no rubs, II/VI systolic murmur at the LUSB.  Quieter with Valsalva and louder with hand grip. ABD:  Flat, positive bowel sounds normal in frequency in pitch, no bruits, no rebound, no guarding, no midline pulsatile mass, no hepatomegaly, no splenomegaly EXT:  2 plus pulses throughout, no edema, no cyanosis no clubbing SKIN:  No rashes no nodules NEURO:  Cranial nerves II through XII grossly intact, motor grossly intact throughout PSYCH:  Cognitively intact, oriented to person place and time   EKG:  EKG is ordered today. The ekg ordered today demonstrates sinus rhythm. Rate 78 bpm.    Recent Labs: No results found for requested labs within last 8760 hours.    Lipid Panel No results found for: CHOL, TRIG, HDL, CHOLHDL, VLDL, LDLCALC, LDLDIRECT    Wt Readings from Last 3 Encounters:  06/30/16 86.6 kg (191 lb)  02/06/15 81.6 kg (180 lb)  06/04/14 91.2 kg (201 lb)      ASSESSMENT AND PLAN:  # Atypical chest pain: Symptoms are atypical.  She has a family history of CAD.  We will get an exercise Myoview to better assess.  # Abnormal EKG: Prior is not available for comparison.  We will get the records. The EKG today is normal.   # Murmur: Systolic murmur noted on exam.  It diminished with  Valsalva and increased with hand grip, making it less likely to be hypertrophic cardiomyopathy. She does have a family history of an unknown cardiac disease. She will call with this diagnosis.  We will obtain an echocardiogram to better assess.   # Hypertension:  blood pressure is elevated today. However her mother passed away last night and she just took her blood pressure medication. We will not make any changes at this time. Reassess in follow-up.   Current medicines are reviewed at length with the patient today.  The patient does not have concerns regarding medicines.  The following changes have been made:  no change  Labs/ tests ordered today include:   Orders Placed This Encounter  Procedures  . Myocardial Perfusion Imaging  . EKG 12-Lead  . ECHOCARDIOGRAM COMPLETE     Disposition:   FU with Sereen Schaff C. Duke Salvia, MD, [facility] in 1 month.     This note was written with the assistance of speech recognition software.  Please excuse any transcriptional errors.  Signed, Nicco Reaume C. Duke Salvia, MD, [place]  06/30/2016 10:49 AM    [facility]

## 2016-07-05 ENCOUNTER — Telehealth: Payer: Self-pay | Admitting: *Deleted

## 2016-07-05 DIAGNOSIS — R0789 Other chest pain: Secondary | ICD-10-CM

## 2016-07-05 NOTE — Telephone Encounter (Signed)
-----   [facility] from [facility] sent at 07/05/2016  2:11 PM EDT ----- Regarding: FW: Denied MPI  Kanijah Groseclose  Can you put in order for GXT and let pt know?  Thanks  Charmaine ----- Message ----- From: Chilton Siandolph, Tiffany, MD Sent: 07/05/2016   1:16 PM To: Britt Bologneseharmaine M Hall Subject: RE: Denied MPI                                 That is fine.   ----- Message ----- From: Britt BologneseHall, Charmaine M Sent: 07/05/2016  10:01 AM To: Chilton Siiffany [place], MD, Burnell BlanksMelinda B Valeria Krisko Subject: Denied MPI                                     Dr. Duke Salviaandolph  Pt's MPI denied by insurance.  Do you want to change to GXT?    Thanks  Charmaine

## 2016-07-05 NOTE — Telephone Encounter (Signed)
Order placed, advised patient, and sent to scheduling to call patient.

## 2016-07-08 NOTE — Addendum Note (Signed)
Addended by: Regis BillPRATT, MELINDA B on: 07/08/2016 11:53 AM   Modules accepted: Orders

## 2016-07-13 ENCOUNTER — Other Ambulatory Visit: Payer: Self-pay

## 2016-07-13 ENCOUNTER — Ambulatory Visit (HOSPITAL_COMMUNITY): Payer: 59 | Attending: Cardiovascular Disease

## 2016-07-13 DIAGNOSIS — R011 Cardiac murmur, unspecified: Secondary | ICD-10-CM | POA: Insufficient documentation

## 2016-07-13 DIAGNOSIS — R079 Chest pain, unspecified: Secondary | ICD-10-CM | POA: Insufficient documentation

## 2016-07-13 DIAGNOSIS — I340 Nonrheumatic mitral (valve) insufficiency: Secondary | ICD-10-CM | POA: Insufficient documentation

## 2016-07-13 DIAGNOSIS — I10 Essential (primary) hypertension: Secondary | ICD-10-CM | POA: Insufficient documentation

## 2016-07-16 ENCOUNTER — Inpatient Hospital Stay (HOSPITAL_COMMUNITY): Admission: RE | Admit: 2016-07-16 | Payer: 59 | Source: Ambulatory Visit

## 2016-07-21 ENCOUNTER — Telehealth (HOSPITAL_COMMUNITY): Payer: Self-pay

## 2016-07-21 NOTE — Telephone Encounter (Signed)
Encounter complete. 

## 2016-07-22 ENCOUNTER — Ambulatory Visit (HOSPITAL_COMMUNITY)
Admission: RE | Admit: 2016-07-22 | Discharge: 2016-07-22 | Disposition: A | Discharge status: Home / Self Care | Payer: 59 | Source: Ambulatory Visit | Attending: Cardiovascular Disease | Admitting: Cardiovascular Disease

## 2016-07-22 ENCOUNTER — Encounter (HOSPITAL_COMMUNITY): Payer: Self-pay | Admitting: *Deleted

## 2016-07-22 DIAGNOSIS — I2589 Other forms of chronic ischemic heart disease: Secondary | ICD-10-CM | POA: Insufficient documentation

## 2016-07-22 DIAGNOSIS — R0789 Other chest pain: Secondary | ICD-10-CM | POA: Diagnosis not present

## 2016-07-22 LAB — EXERCISE TOLERANCE TEST
Estimated workload: 10.1 METS
Exercise duration (min): 9 min
Exercise duration (sec): 0 s
MPHR: 164 {beats}/min
Peak HR: 171 {beats}/min
Percent HR: 104 %
RPE: 18
Rest HR: 69 {beats}/min

## 2016-07-22 NOTE — Progress Notes (Unsigned)
Abnormal ETT was reviewed by Dr. Croitoru. Patient was given the okay to be discharged to go home. 

## 2016-07-23 ENCOUNTER — Telehealth: Payer: Self-pay | Admitting: *Deleted

## 2016-07-23 ENCOUNTER — Encounter: Payer: Self-pay | Admitting: *Deleted

## 2016-07-23 ENCOUNTER — Other Ambulatory Visit: Payer: Self-pay | Admitting: Cardiovascular Disease

## 2016-07-23 DIAGNOSIS — R079 Chest pain, unspecified: Secondary | ICD-10-CM

## 2016-07-23 DIAGNOSIS — Z01812 Encounter for preprocedural laboratory examination: Secondary | ICD-10-CM

## 2016-07-23 MED ORDER — OLMESARTAN MEDOXOMIL-HCTZ 40-25 MG PO TABS: 1.0000 | ORAL_TABLET | Freq: Every day | ORAL | 1 refills | Status: DC

## 2016-07-23 NOTE — Telephone Encounter (Signed)
-----   Message from Chilton Siiffany [place], MD sent at 07/23/2016  8:54 AM EDT ----- Stress test was abnormal enhancement concerning for ischemia. Blood pressure was poorly-controlled. Given these findings and her family history I recommend she have a heart catheterization. I called and discussed the findings with her.  She agrees to cardiac cath.

## 2016-07-23 NOTE — Telephone Encounter (Signed)
Advised to start ASA 81 mg daily

## 2016-07-23 NOTE — Telephone Encounter (Signed)
Per Dr Duke Salviaandolph patient also needed to double Benicar dose  Advised patient of cath scheduled 08/06/16 (date at her request) arrive at 5:30 for 7:30 with Dr Katrinka BlazingSmith  Letter at front desk for pick up when she gets labs next week

## 2016-08-03 LAB — BASIC METABOLIC PANEL
BUN/Creatinine Ratio: 16 (ref 9–23)
BUN: 14 mg/dL (ref 6–24)
CO2: 25 mmol/L (ref 20–29)
Calcium: 9.3 mg/dL (ref 8.7–10.2)
Chloride: 99 mmol/L (ref 96–106)
Creatinine, Ser: 0.89 mg/dL (ref 0.57–1.00)
GFR calc Af Amer: 84 mL/min/{1.73_m2} (ref >59)
GFR calc non Af Amer: 73 mL/min/{1.73_m2} (ref >59)
Glucose: 102 mg/dL — ABNORMAL HIGH (ref 65–99)
Potassium: 4.8 mmol/L (ref 3.5–5.2)
Sodium: 141 mmol/L (ref 134–144)

## 2016-08-03 LAB — CBC WITH DIFFERENTIAL/PLATELET
Basophils Absolute: 0 10*3/uL (ref 0.0–0.2)
Basos: 0 %
EOS (ABSOLUTE): 0.1 10*3/uL (ref 0.0–0.4)
Eos: 1 %
Hematocrit: 37.4 % (ref 34.0–46.6)
Hemoglobin: 12.3 g/dL (ref 11.1–15.9)
Immature Grans (Abs): 0 10*3/uL (ref 0.0–0.1)
Immature Granulocytes: 0 %
Lymphocytes Absolute: 2.9 10*3/uL (ref 0.7–3.1)
Lymphs: 32 %
MCH: 28.9 pg (ref 26.6–33.0)
MCHC: 32.9 g/dL (ref 31.5–35.7)
MCV: 88 fL (ref 79–97)
Monocytes Absolute: 0.7 10*3/uL (ref 0.1–0.9)
Monocytes: 8 %
Neutrophils Absolute: 5.6 10*3/uL (ref 1.4–7.0)
Neutrophils: 59 %
Platelets: 360 10*3/uL (ref 150–379)
RBC: 4.25 x10E6/uL (ref 3.77–5.28)
RDW: 14.8 % (ref 12.3–15.4)
WBC: 9.3 10*3/uL (ref 3.4–10.8)

## 2016-08-03 LAB — PROTIME-INR
INR: 0.9 (ref 0.8–1.2)
Prothrombin Time: 10 s (ref 9.1–12.0)

## 2016-08-03 LAB — APTT: aPTT: 25 s (ref 24–33)

## 2016-08-05 ENCOUNTER — Telehealth: Payer: Self-pay

## 2016-08-05 NOTE — Telephone Encounter (Signed)
[facility] for:  08/06/2016 @ 0730 Verified arrival time and place:  NT @ 0530 Confirmed AM meds to be taken pre-cath with sip of water:  Notified to take ASA prior to arrival.  Notified to hold olmesartan-HCTZ am of procedure Confirmed patient has responsible person to drive home post procedure and observe patient for 24 hours:  yes Addl concerns:  All questions answered.  This nurse name and # given for further questions/concerns

## 2016-08-06 ENCOUNTER — Ambulatory Visit (HOSPITAL_COMMUNITY)
Admission: RE | Admit: 2016-08-06 | Discharge: 2016-08-06 | Disposition: A | Discharge status: Home / Self Care | Payer: 59 | Source: Ambulatory Visit | Attending: Interventional Cardiology | Admitting: Interventional Cardiology

## 2016-08-06 ENCOUNTER — Encounter (HOSPITAL_COMMUNITY): Payer: Self-pay | Admitting: Interventional Cardiology

## 2016-08-06 ENCOUNTER — Encounter (HOSPITAL_COMMUNITY)
Admission: RE | Disposition: A | Discharge status: Home / Self Care | Payer: Self-pay | Source: Ambulatory Visit | Attending: Interventional Cardiology

## 2016-08-06 DIAGNOSIS — G8929 Other chronic pain: Secondary | ICD-10-CM | POA: Diagnosis not present

## 2016-08-06 DIAGNOSIS — R9439 Abnormal result of other cardiovascular function study: Secondary | ICD-10-CM

## 2016-08-06 DIAGNOSIS — R0789 Other chest pain: Secondary | ICD-10-CM | POA: Diagnosis not present

## 2016-08-06 DIAGNOSIS — M545 Low back pain: Secondary | ICD-10-CM | POA: Insufficient documentation

## 2016-08-06 DIAGNOSIS — F329 Major depressive disorder, single episode, unspecified: Secondary | ICD-10-CM | POA: Diagnosis not present

## 2016-08-06 DIAGNOSIS — E039 Hypothyroidism, unspecified: Secondary | ICD-10-CM | POA: Insufficient documentation

## 2016-08-06 DIAGNOSIS — E559 Vitamin D deficiency, unspecified: Secondary | ICD-10-CM | POA: Diagnosis not present

## 2016-08-06 DIAGNOSIS — I10 Essential (primary) hypertension: Secondary | ICD-10-CM | POA: Insufficient documentation

## 2016-08-06 DIAGNOSIS — M1990 Unspecified osteoarthritis, unspecified site: Secondary | ICD-10-CM | POA: Diagnosis not present

## 2016-08-06 HISTORY — PX: LEFT HEART CATH AND CORONARY ANGIOGRAPHY: CATH118249

## 2016-08-06 SURGERY — LEFT HEART CATH AND CORONARY ANGIOGRAPHY
Anesthesia: LOCAL

## 2016-08-06 MED ORDER — VERAPAMIL HCL 2.5 MG/ML IV SOLN
INTRAVENOUS | Status: AC
  Filled 2016-08-06: qty 2

## 2016-08-06 MED ORDER — ONDANSETRON HCL 4 MG/2ML IJ SOLN: 4.0000 mg | Freq: Four times a day (QID) | INTRAMUSCULAR | Status: DC | PRN

## 2016-08-06 MED ORDER — SODIUM CHLORIDE 0.9% FLUSH: 3.0000 mL | INTRAVENOUS | Status: DC | PRN

## 2016-08-06 MED ORDER — FENTANYL CITRATE (PF) 100 MCG/2ML IJ SOLN
INTRAMUSCULAR | Status: DC | PRN
  Administered 2016-08-06: 50 ug via INTRAVENOUS

## 2016-08-06 MED ORDER — SODIUM CHLORIDE 0.9 % WEIGHT BASED INFUSION
3.0000 mL/kg/h | INTRAVENOUS | Status: DC
  Administered 2016-08-06: 3 mL/kg/h via INTRAVENOUS

## 2016-08-06 MED ORDER — MIDAZOLAM HCL 2 MG/2ML IJ SOLN
INTRAMUSCULAR | Status: AC
  Filled 2016-08-06: qty 2

## 2016-08-06 MED ORDER — LIDOCAINE HCL (PF) 1 % IJ SOLN
INTRAMUSCULAR | Status: DC | PRN
  Administered 2016-08-06: 2 mL

## 2016-08-06 MED ORDER — MIDAZOLAM HCL 2 MG/2ML IJ SOLN
INTRAMUSCULAR | Status: DC | PRN
  Administered 2016-08-06 (×2): 1 mg via INTRAVENOUS

## 2016-08-06 MED ORDER — ASPIRIN 81 MG PO CHEW: 81.0000 mg | CHEWABLE_TABLET | ORAL | Status: DC

## 2016-08-06 MED ORDER — SODIUM CHLORIDE 0.9% FLUSH: 3.0000 mL | Freq: Two times a day (BID) | INTRAVENOUS | Status: DC

## 2016-08-06 MED ORDER — HEPARIN (PORCINE) IN NACL 2-0.9 UNIT/ML-% IJ SOLN
INTRAMUSCULAR | Status: AC | PRN
  Administered 2016-08-06: 1000 mL

## 2016-08-06 MED ORDER — SODIUM CHLORIDE 0.9 % IV SOLN: INTRAVENOUS | Status: DC

## 2016-08-06 MED ORDER — FENTANYL CITRATE (PF) 100 MCG/2ML IJ SOLN
INTRAMUSCULAR | Status: AC
  Filled 2016-08-06: qty 2

## 2016-08-06 MED ORDER — IOPAMIDOL (ISOVUE-370) INJECTION 76%
INTRAVENOUS | Status: AC
  Filled 2016-08-06: qty 100

## 2016-08-06 MED ORDER — HEPARIN SODIUM (PORCINE) 1000 UNIT/ML IJ SOLN
INTRAMUSCULAR | Status: DC | PRN
  Administered 2016-08-06: 4000 [IU] via INTRAVENOUS

## 2016-08-06 MED ORDER — HEPARIN (PORCINE) IN NACL 2-0.9 UNIT/ML-% IJ SOLN
INTRAMUSCULAR | Status: AC
  Filled 2016-08-06: qty 1000

## 2016-08-06 MED ORDER — LIDOCAINE HCL (PF) 1 % IJ SOLN
INTRAMUSCULAR | Status: AC
  Filled 2016-08-06: qty 30

## 2016-08-06 MED ORDER — OXYCODONE-ACETAMINOPHEN 5-325 MG PO TABS: 1.0000 | ORAL_TABLET | ORAL | Status: DC | PRN

## 2016-08-06 MED ORDER — SODIUM CHLORIDE 0.9 % WEIGHT BASED INFUSION: 1.0000 mL/kg/h | INTRAVENOUS | Status: DC

## 2016-08-06 MED ORDER — HEPARIN SODIUM (PORCINE) 1000 UNIT/ML IJ SOLN
INTRAMUSCULAR | Status: AC
  Filled 2016-08-06: qty 1

## 2016-08-06 MED ORDER — ACETAMINOPHEN 325 MG PO TABS: 650.0000 mg | ORAL_TABLET | ORAL | Status: DC | PRN

## 2016-08-06 MED ORDER — SODIUM CHLORIDE 0.9 % IV SOLN: 250.0000 mL | INTRAVENOUS | Status: DC | PRN

## 2016-08-06 MED ORDER — IOPAMIDOL (ISOVUE-370) INJECTION 76%
INTRAVENOUS | Status: DC | PRN
  Administered 2016-08-06: 50 mL via INTRA_ARTERIAL

## 2016-08-06 MED ORDER — VERAPAMIL HCL 2.5 MG/ML IV SOLN
INTRAVENOUS | Status: DC | PRN
  Administered 2016-08-06: 10 mL via INTRA_ARTERIAL

## 2016-08-06 SURGICAL SUPPLY — 12 items
CATH EXPO 5FR FR4 (CATHETERS) ×1 IMPLANT
CATH INFINITI 5 FR JL3.5 (CATHETERS) ×1 IMPLANT
COVER PRB 48X5XTLSCP FOLD TPE (BAG) IMPLANT
COVER PROBE 5X48 (BAG) ×2
DEVICE RAD COMP TR BAND LRG (VASCULAR PRODUCTS) ×1 IMPLANT
GLIDESHEATH SLEND A-KIT 6F 22G (SHEATH) ×1 IMPLANT
GUIDEWIRE INQWIRE 1.5J.035X260 (WIRE) IMPLANT
INQWIRE 1.5J .035X260CM (WIRE) ×2
KIT HEART LEFT (KITS) ×2 IMPLANT
PACK CARDIAC CATHETERIZATION (CUSTOM PROCEDURE TRAY) ×2 IMPLANT
TRANSDUCER W/STOPCOCK (MISCELLANEOUS) ×2 IMPLANT
TUBING CIL FLEX 10 FLL-RA (TUBING) ×2 IMPLANT

## 2016-08-06 NOTE — Discharge Instructions (Signed)

## 2016-08-06 NOTE — H&P (Signed)
Date: 08/06/2016  ID:  [name], DOB July 26, 1960, MRN [id number]  PCP:  Dorothyann Peng, MD     Cardiologist:   Chilton Si, MD   No chief complaint on file.     History of Present Illness: "[name] is a 56 y.o. female hypertension who presents for evaluation of an abnormal EKG. Ms. Call saw Dr. Allyne Gee and was noted to have abnormalities on her EKG. Those records are not available at this time.  She also has a family history of CAD.  She was referred to cardiology for further evaluation.  Her sister died of a heart attack at age 59 without any preceding symptoms.  She also has a family history of a congenital heart disease, though she is unable to recall the name. Her mother, aunt, and 2 cousins all have this disease. Her mother had a heart attack and passed away last night. Prior to this she had a massive stroke and was planning to be transitioned to hospice. However she died before this took place.  Ms. Slavick reports intermittent episodes of left-sided chest pain. This typically occurs when she is laying down. She denies any exertional symptoms. She also has exertional shortness of breath but denies orthopnea or PND. She occasionally has swelling in the left leg especially after standing for prolonged periods of time. She endorses feeling a sharp pain in her left side when she takes a deep breath. She notes occasional palpitations that last for a few seconds at a time. Happens when she is sitting at her desk at work. She endorses episodes of transient blurry vision that sometimes occurs while driving. She has to pull over to side. She denies syncope.  She does not get much formal exercise but is active.  She notes exertional fatigue. " - Per Dr. Duke Salvia 06/30/2016.  Subsequently underwent an exercise treadmill test demonstrated an abnormal EKG response with significant ST segment depression in II III, aVF, V5, and V6. She had excellent exercise tolerance but a  hypertensive blood pressure response. No complaint of chest pain with exercise. Because of the abnormal stress test, she is referred for coronary angiography.       Past Medical History:  Diagnosis Date  . Allergy   . Arthritis    back   . Atypical chest pain 06/30/2016  . Back pain    DUE TO INJURY AT WORK ON05/2013  . Bruises easily   . Chest pain 06/30/2016  . Chronic lower back pain   . Depression    was on meds 2 yrs ago   . Essential hypertension 06/30/2016  . Headache(784.0)    rarely  . History of bronchitis    last time about 50yrs ago   . Hypertension    takes Benicar daily  . Hypothyroidism 06/30/2016  . Vitamin D deficiency    takes Vitamin D 2 times/wk  . Weakness    and numbness right foot         Past Surgical History:  Procedure Laterality Date  . BACK SURGERY    . CARPAL TUNNEL RELEASE Right   . CESAREAN SECTION  1988  . COLONOSCOPY    . EXPLORATORY LAPAROTOMY  1991   "took out fatty tumor" (11/09/2012)  . LUMBAR LAMINECTOMY/DECOMPRESSION MICRODISCECTOMY  11/09/2012   "L3-4" (11/09/2012)  . LUMBAR LAMINECTOMY/DECOMPRESSION MICRODISCECTOMY N/A 11/09/2012   Procedure: L3-L4 DECOMPRESSION AND MICRODISCECTOMY   (1 LEVEL);  Surgeon: Venita Lick, MD;  Location: [facility] OR;  Service: Orthopedics;  Laterality: N/A;  Current Outpatient Prescriptions  Medication Sig Dispense Refill  . levocetirizine (XYZAL) 5 MG tablet Take 5 mg by mouth every evening.    . Lorcaserin HCl (BELVIQ) 10 MG TABS Take 1 tablet by mouth 2 (two) times daily.    . Multiple Vitamins-Minerals (WOMENS MULTIVITAMIN PO) Take 1 capsule by mouth daily.    Marland Kitchen. olmesartan-hydrochlorothiazide (BENICAR HCT) 20-12.5 MG tablet Take 1 tablet by mouth daily. 30 tablet 0   No current facility-administered medications for this visit.     Allergies:   Patient has no known allergies.    Social History:  The patient  reports that she has never  smoked. She has never used smokeless tobacco. She reports that she drinks about 4.2 oz of alcohol per week . She reports that she does not use drugs.   Family History:  The patient's family history includes CAD in her mother; Heart disease in her mother and sister; Liver disease in her brother; Stroke in her mother.    ROS:  Please see the history of present illness.   Otherwise, review of systems are positive for headaches, red eyes.   All other systems are reviewed and negative.    PHYSICAL EXAM: VS:  BP (!) 162/90   Pulse 80   Ht 5\' 1"  (1.549 m)   Wt 86.6 kg (191 lb)   LMP 06/03/2011   SpO2 97%   BMI 36.09 kg/m  , BMI Body mass index is 36.09 kg/m. GENERAL:  Well appearing.  Tearful HEENT:  Pupils equal round and reactive, fundi not visualized, oral mucosa unremarkable NECK:  No jugular venous distention, waveform within normal limits, carotid upstroke brisk and symmetric, no bruits, no thyromegaly LYMPHATICS:  No cervical adenopathy LUNGS:  Clear to auscultation bilaterally.  No crackles, wheezes or rhonchi. HEART:  RRR.  PMI not displaced or sustained,S1 and S2 within normal limits, no S3, no S4, no clicks, no rubs, II/VI systolic murmur at the LUSB.  Quieter with Valsalva and louder with hand grip. ABD:  Flat, positive bowel sounds normal in frequency in pitch, no bruits, no rebound, no guarding, no midline pulsatile mass, no hepatomegaly, no splenomegaly EXT:  2 plus pulses throughout, no edema, no cyanosis no clubbing SKIN:  No rashes no nodules NEURO:  Cranial nerves II through XII grossly intact, motor grossly intact throughout PSYCH:  Cognitively intact, oriented to person place and time   EKG:  EKG is ordered today is normal with sinus rhythm. Rate 78 bpm. personally reviewed the EKG performed on 08/06/2016   Recent Labs: No results found for requested labs within last 8760 hours.    Lipid Panel Labs (Brief)  No results found for: CHOL, TRIG, HDL,  CHOLHDL, VLDL, LDLCALC, LDLDIRECT         Wt Readings from Last 3 Encounters:  06/30/16 86.6 kg (191 lb)  02/06/15 81.6 kg (180 lb)  06/04/14 91.2 kg (201 lb)    Echocardiography performed on 07/13/2016: Study Conclusions  - Left ventricle: The cavity size was normal. Wall thickness was   normal. Systolic function was vigorous. The estimated ejection   fraction was in the range of 65% to 70%. Wall motion was normal;   there were no regional wall motion abnormalities. Features are   consistent with a pseudonormal left ventricular filling pattern,   with concomitant abnormal relaxation and increased filling   pressure (grade 2 diastolic dysfunction). - Mitral valve: There was mild regurgitation. - Pulmonary arteries: Systolic pressure was moderately increased.   PA  peak pressure: 48 mm Hg (S).  ASSESSMENT AND PLAN:  1. Abnormal exercise treadmill test. Risk factors for CAD exist. Coronary angiography has been recommended to exclude significant obstructive coronary disease.  The patient was counseled to undergo left heart catheterization, coronary angiography, and possible percutaneous coronary intervention with stent implantation. The procedural risks and benefits were discussed in detail. The risks discussed included death, stroke, myocardial infarction, life-threatening bleeding, limb ischemia, kidney injury, allergy, and possible emergency cardiac surgery. The risk of these significant complications were estimated to occur less than 1% of the time. After discussion, the patient has agreed to proceed.     This note was written with the assistance of speech recognition software.  Please excuse any transcriptional errors.

## 2016-08-10 IMAGING — US US EXTREM LOW VENOUS*L*
1 series · 13 of 24 positions shown · non-contrast
Comparison: None.

CLINICAL DATA: Left lower extremity pain and swelling.



[Series 1: us extrem low venous*left* · 0.07mm/px · 13 of 42 slices shown]
[im 1/42]
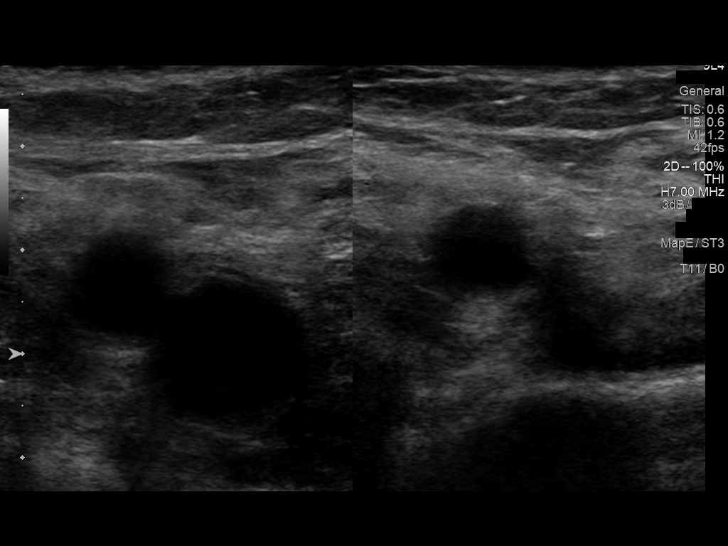
[im 4/42]
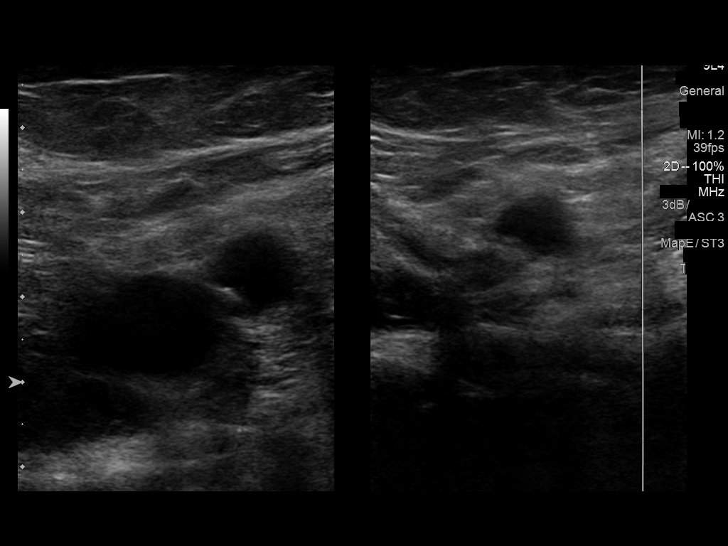
[im 8/42]
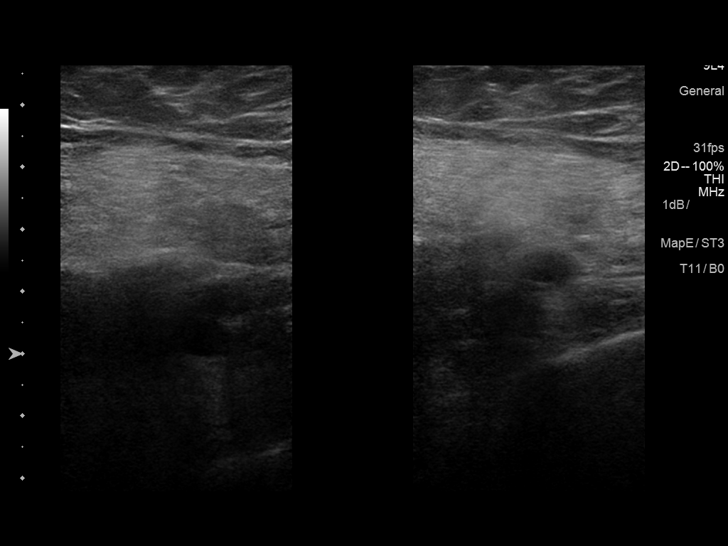
[im 11/42]
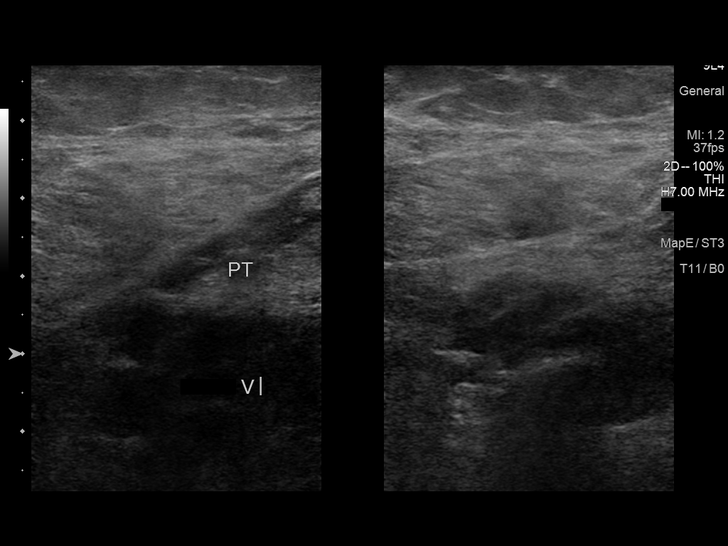
[im 15/42]
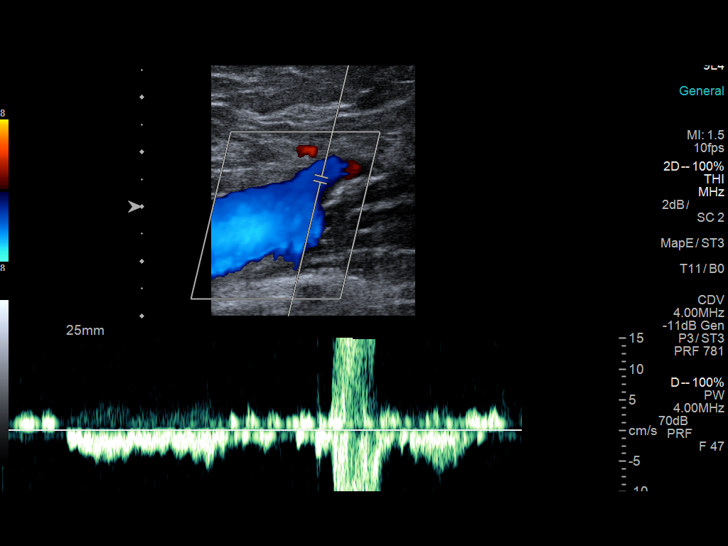
[im 18/42]
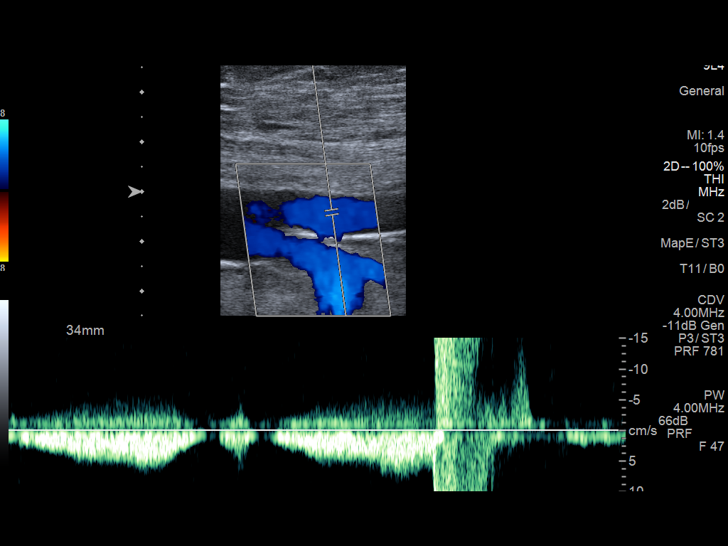
[im 22/42]
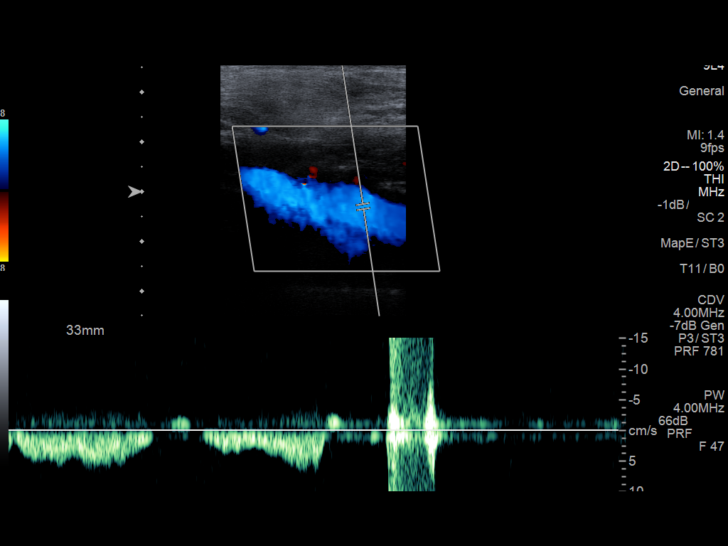
[im 24/42]
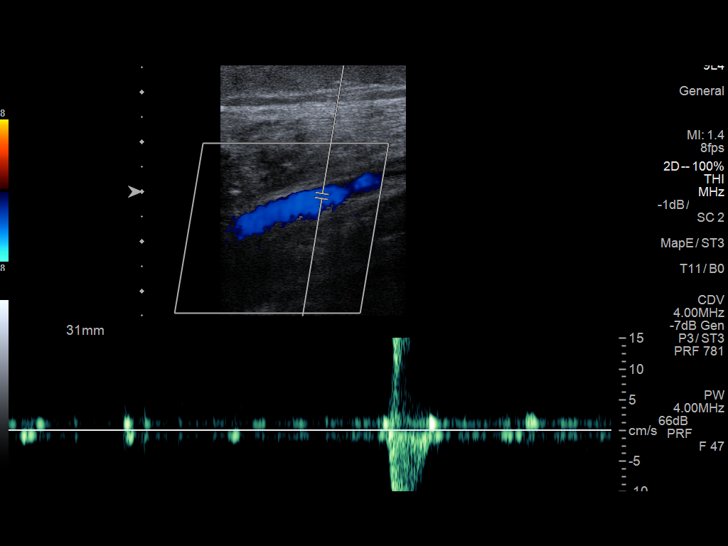
[im 27/42]
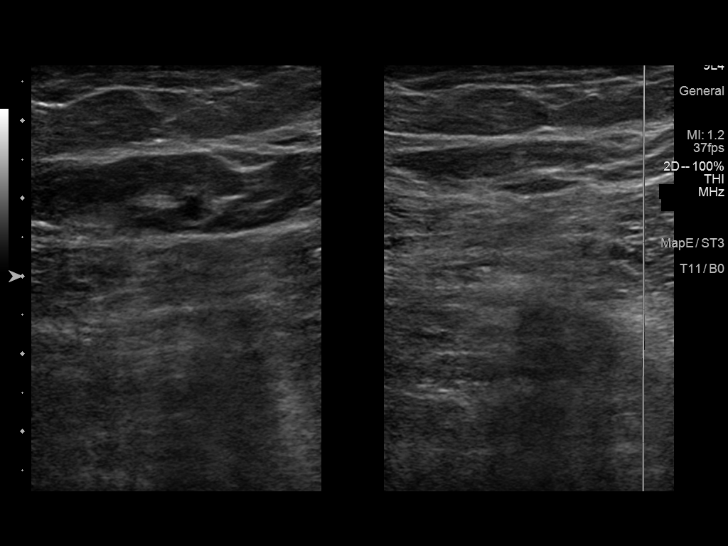
[im 31/42]
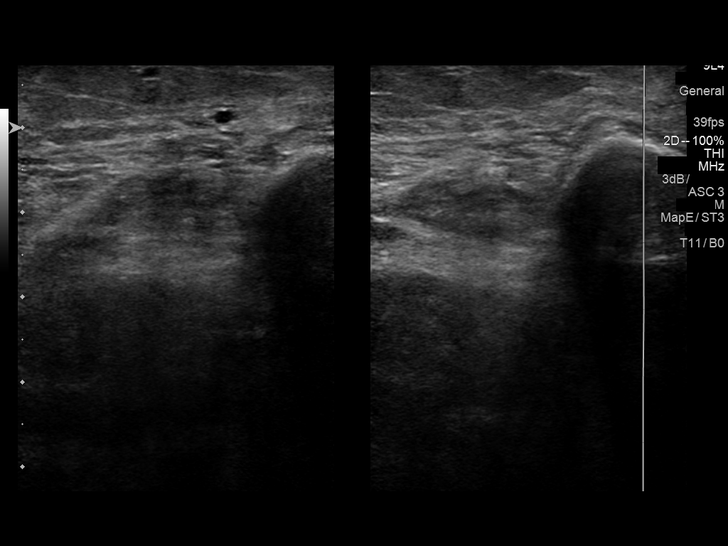
[im 34/42]
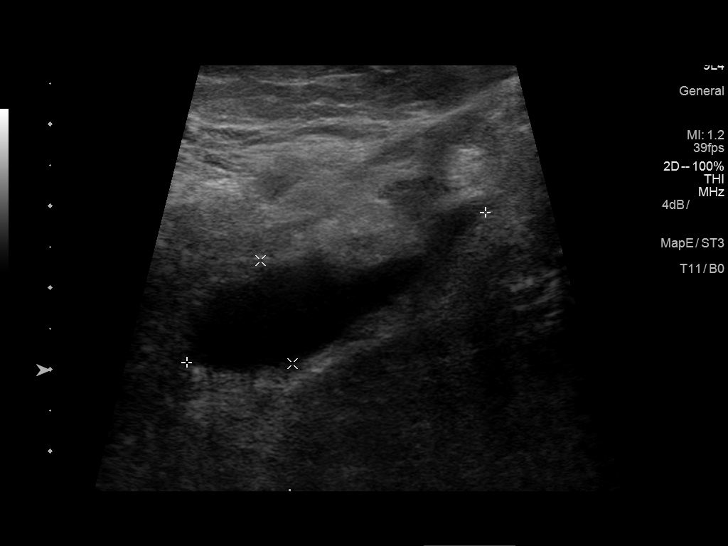
[im 38/42]
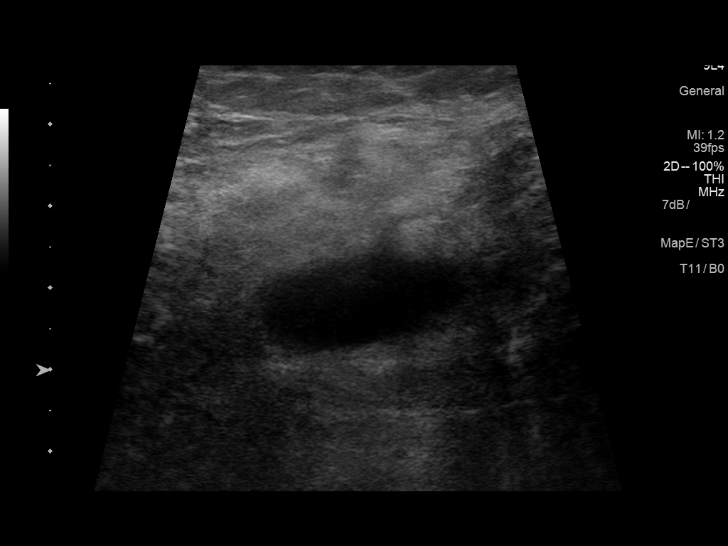
[im 42/42]
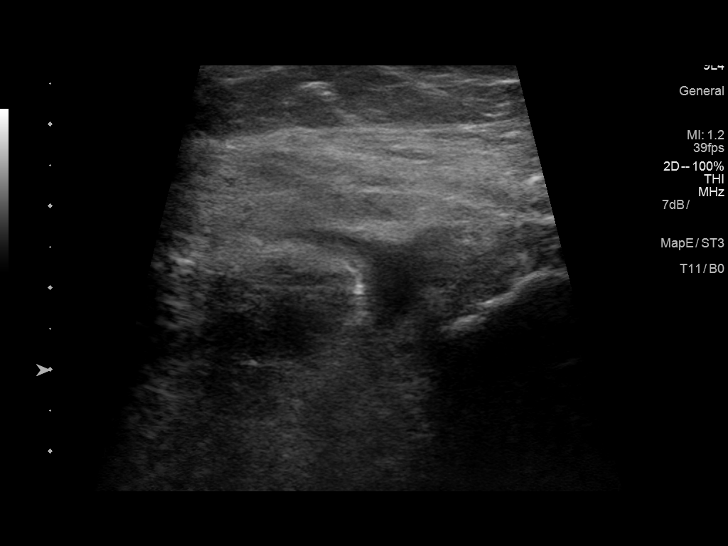

[13 of 24 positions shown; findings below may reference images not displayed]

FINDINGS: Contralateral Common Femoral Vein: Respiratory phasicity is normal
and symmetric with the symptomatic side. No evidence of thrombus.
Normal compressibility.

Common Femoral Vein: No evidence of thrombus. Normal
compressibility, respiratory phasicity and response to augmentation.

Saphenofemoral Junction: No evidence of thrombus. Normal
compressibility and flow on color Doppler imaging.

Profunda Femoral Vein: No evidence of thrombus. Normal
compressibility and flow on color Doppler imaging.

Femoral Vein: No evidence of thrombus. Normal compressibility,
respiratory phasicity and response to augmentation.

Popliteal Vein: No evidence of thrombus. Normal compressibility,
respiratory phasicity and response to augmentation.

Calf Veins: No evidence of thrombus. Normal compressibility and flow
on color Doppler imaging.

Superficial Great Saphenous Vein: No evidence of thrombus. Normal
compressibility and flow on color Doppler imaging.

Venous Reflux:  None.

Other Findings: 5.5 x 1.4 x 2.9 cm fluid collection noted in the
popliteal fossa consistent with a Baker's cyst.
IMPRESSION: No evidence of DVT within the left lower extremity.

5 cm popliteal/Baker's cyst.

## 2016-08-11 ENCOUNTER — Encounter: Payer: Self-pay | Admitting: Cardiovascular Disease

## 2016-08-11 ENCOUNTER — Encounter: Payer: Self-pay | Admitting: *Deleted

## 2016-08-11 ENCOUNTER — Ambulatory Visit (INDEPENDENT_AMBULATORY_CARE_PROVIDER_SITE_OTHER): Payer: 59 | Admitting: Cardiovascular Disease

## 2016-08-11 VITALS — BP 126/78 | HR 80 | Ht 61.0 in | Wt 193.0 lb

## 2016-08-11 DIAGNOSIS — I10 Essential (primary) hypertension: Secondary | ICD-10-CM

## 2016-08-11 DIAGNOSIS — R9439 Abnormal result of other cardiovascular function study: Secondary | ICD-10-CM | POA: Diagnosis not present

## 2016-08-11 DIAGNOSIS — R0789 Other chest pain: Secondary | ICD-10-CM | POA: Diagnosis not present

## 2016-08-11 NOTE — Progress Notes (Signed)
Cardiology Office Note   Date:  08/11/2016   ID:  Rebekah HeadlandBlanche R Mauzy, DOB [birth year], MRN [id number]  PCP:  Dorothyann PengSanders, Robyn, MD  Cardiologist:   Chilton Siiffany [place], MD   Chief Complaint  Patient presents with  . Follow-up    NP F/U after tests.      History of Present Illness: [name] is a 56 y.o. female hypertension who presents for follow up.  She was initially seen 06/2016 for an abnormal EKG.  Her sister died of a heart attack at age 56 without any preceding symptoms.  She also has a family history of long QT syndrome. Her mother, aunt, and 2 cousins all have this disease. She also reported intermittent episodes of atypical chest pain and was referred for an ETT 07/22/16 that revealed ST depressions.  She achieved 10.1 METs on the Bruce protocol.  She was referred for left heart catheterization that showed normal coronary arteries.  At her last appointment [name] was also noted to have a systolic murmur.  She was referred for an echo that revealed LVEF 65-70% and grade 2 diastolic dysfunction.  Pulmonary pressures were mildly elevated (PASP 48 mmHg).  Since her heart cath [name] has been doing well. She denies any chest pain or shortness of breath. She continues to have some throbbing in her right arm. Her blood pressure at home has been somewhat labile. It is mostly in the 120s-130s/80s.  She does report increased salt intake lately because she hasn't been able to cook for herself.  She hasn't noted any lower extremity edema, orthopnea, or PND. She has not started back exercising but [facility].  She has been taking Belviq for weight loss but it is not helping.   Past Medical History:  Diagnosis Date  . Allergy   . Arthritis    back   . Atypical chest pain 06/30/2016  . Back pain    DUE TO INJURY AT WORK ON05/2013  . Bruises easily   . Chest pain 06/30/2016  . Chronic lower back pain   . Depression    was on meds 2 yrs ago   . Essential  hypertension 06/30/2016  . Headache(784.0)    rarely  . History of bronchitis    last time about 4079yrs ago   . Hypertension    takes Benicar daily  . Hypothyroidism 06/30/2016  . Vitamin D deficiency    takes Vitamin D 2 times/wk  . Weakness    and numbness right foot    Past Surgical History:  Procedure Laterality Date  . BACK SURGERY    . CARPAL TUNNEL RELEASE Right   . CESAREAN SECTION  1988  . COLONOSCOPY    . EXPLORATORY LAPAROTOMY  1991   "took out fatty tumor" (11/09/2012)  . LEFT HEART CATH AND CORONARY ANGIOGRAPHY N/A 08/06/2016   Procedure: Left Heart Cath and Coronary Angiography;  Surgeon: Lyn RecordsSmith, Henry W, MD;  [facility];  Service: Cardiovascular;  Laterality: N/A;  . LUMBAR LAMINECTOMY/DECOMPRESSION MICRODISCECTOMY  11/09/2012   "L3-4" (11/09/2012)  . LUMBAR LAMINECTOMY/DECOMPRESSION MICRODISCECTOMY N/A 11/09/2012   Procedure: L3-L4 DECOMPRESSION AND MICRODISCECTOMY   (1 LEVEL);  Surgeon: Venita Lickahari Brooks, MD;  [facility];  Service: Orthopedics;  Laterality: N/A;     Current Outpatient Prescriptions  Medication Sig Dispense Refill  . aspirin EC 81 MG tablet Take 81 mg by mouth daily.    . fluticasone (FLONASE) 50 MCG/ACT nasal spray  Place 1 spray into both nostrils daily as needed for allergies or rhinitis.    Marland Kitchen. levocetirizine (XYZAL) 5 MG tablet Take 5 mg by mouth daily as needed for allergies.     . Lorcaserin HCl (BELVIQ) 10 MG TABS Take 1 tablet by mouth 2 (two) times daily.    . Multiple Vitamins-Minerals (WOMENS MULTIVITAMIN PO) Take 1 capsule by mouth daily.    Marland Kitchen. olmesartan-hydrochlorothiazide (BENICAR HCT) 40-25 MG tablet Take 1 tablet by mouth daily. 901 tablet 1   No current facility-administered medications for this visit.     Allergies:   Patient has no known allergies.    Social History:  The patient  reports that she has never smoked. She has never used smokeless tobacco. She reports that she drinks about 4.2 oz of alcohol per  week . She reports that she does not use drugs.   Family History:  The patient's family history includes CAD in her mother; Heart disease in her mother and sister; Liver disease in her brother; Stroke in her mother.    ROS:  Please see the history of present illness.   Otherwise, review of systems are positive for none.   All other systems are reviewed and negative.    PHYSICAL EXAM: VS:  BP 126/78   Pulse 80   Ht 5\' 1"  (1.549 m)   Wt 87.5 kg (193 lb)   LMP 06/03/2011   BMI 36.47 kg/m  , BMI Body mass index is 36.47 kg/m. GENERAL:  Well appearing.  No acute distress. HEENT:  Pupils equal round and reactive, fundi not visualized, oral mucosa unremarkable NECK:  No jugular venous distention, waveform within normal limits, carotid upstroke brisk and symmetric, no bruitsvital LUNGS:  Clear to auscultation bilaterally.  No crackles, wheezes or rhonchi. HEART:  RRR.  PMI not displaced or sustained,S1 and S2 within normal limits, no S3, no S4, no clicks, no rubs, no murmur ABD:  Flat, positive bowel sounds normal in frequency in pitch, no bruits, no rebound, no guarding, no midline pulsatile mass, no hepatomegaly, no splenomegaly EXT:  2 plus pulses throughout, no edema, no cyanosis no clubbing SKIN:  No rashes no nodules NEURO:  Cranial nerves II through XII grossly intact, motor grossly intact throughout PSYCH:  Cognitively intact, oriented to person place and time  EKG:  EKG is not ordered today. The ekg ordered today demonstrates sinus rhythm. Rate 78 bpm.   LHC 08/06/16: Study Conclusions  - Left ventricle: The cavity size was normal. Wall thickness was   normal. Systolic function was vigorous. The estimated ejection   fraction was in the range of 65% to 70%. Wall motion was normal;   there were no regional wall motion abnormalities. Features are   consistent with a pseudonormal left ventricular filling pattern,   with concomitant abnormal relaxation and increased filling    pressure (grade 2 diastolic dysfunction). - Mitral valve: There was mild regurgitation. - Pulmonary arteries: Systolic pressure was moderately increased.   PA peak pressure: 48 mm Hg (S).  ETT 07/22/16: Blood pressure demonstrated a normal response to exercise.  Clinically negative Electrically positive for ischemia with significant ST changes beginning in Stage II.  Excellent exercise tolerance  Echo 07/13/16: Study Conclusions  - Left ventricle: The cavity size was normal. Wall thickness was   normal. Systolic function was vigorous. The estimated ejection   fraction was in the range of 65% to 70%. Wall motion was normal;   there were no regional wall motion abnormalities.  Features are   consistent with a pseudonormal left ventricular filling pattern,   with concomitant abnormal relaxation and increased filling   pressure (grade 2 diastolic dysfunction). - Mitral valve: There was mild regurgitation. - Pulmonary arteries: Systolic pressure was moderately increased.   PA peak pressure: 48 mm Hg (S).  Recent Labs: 08/02/2016: BUN 14; Creatinine, Ser 0.89; Hemoglobin 12.3; Platelets 360; Potassium 4.8; Sodium 141   06/17/16: TSH 2.7 Hemoglobin A1c 5.9 Total cholesterol 219, triglycerides 83, HDL 67, LDL 139  Lipid Panel No results found for: CHOL, TRIG, HDL, CHOLHDL, VLDL, LDLCALC, LDLDIRECT    Wt Readings from Last 3 Encounters:  08/11/16 87.5 kg (193 lb)  08/06/16 84.4 kg (186 lb)  06/30/16 86.6 kg (191 lb)      ASSESSMENT AND PLAN:  # Atypical chest pain: Symptoms have resolved.  LHC showed no CAD.  # Grade 2 diastolic dysfunction: [name] is euvolemic.  Recommended limiting her salt intake.  # Hypertension:  Blood pressure was initially elevated but within normal limits on repeat. Limit salt and increase exercise to at least 30-40 minutes most days of the week.   Current medicines are reviewed at length with the patient today.  The patient does not have concerns  regarding medicines.  The following changes have been made:  no change  Labs/ tests ordered today include:   No orders of the defined types were placed in this encounter.    Disposition:   FU with Ji Fairburn C. Duke Salvia, MD, [facility] as needed.   This note was written with the assistance of speech recognition software.  Please excuse any transcriptional errors.  Signed, Clevie Prout C. Duke Salvia, MD, [facility]  08/11/2016 8:38 AM    [facility]

## 2016-08-11 NOTE — Patient Instructions (Signed)
Medication Instructions:  .Your physician recommends that you continue on your current medications as directed. Please refer to the Current Medication list given to you today.  Labwork: none  Testing/Procedures: none  Follow-Up: As needed   

## 2016-09-01 ENCOUNTER — Telehealth: Payer: Self-pay | Admitting: Cardiovascular Disease

## 2016-09-01 NOTE — Telephone Encounter (Signed)
Pt had a Cath on 08-06-16. She is complaining of right arm pain,no other symptoms.

## 2016-09-01 NOTE — Telephone Encounter (Signed)
Spoke with pt she states that she was awoke @5am 'ish this morning with arm pain located in elbow area up to axilla, she denies any trauma, and it comes and goes since. She will keep an eye on it today and will call back tomorrow if it continues. She will call her pcp and see what she says and then call back if necessary

## 2016-09-01 NOTE — Telephone Encounter (Signed)
[email or web address] 

## 2016-09-10 ENCOUNTER — Other Ambulatory Visit: Payer: Self-pay | Admitting: Nurse Practitioner

## 2016-09-10 DIAGNOSIS — M79606 Pain in leg, unspecified: Secondary | ICD-10-CM

## 2016-09-14 ENCOUNTER — Ambulatory Visit
Admission: RE | Admit: 2016-09-14 | Discharge: 2016-09-14 | Disposition: A | Discharge status: Home / Self Care | Payer: 59 | Source: Ambulatory Visit | Attending: Nurse Practitioner | Admitting: Nurse Practitioner

## 2016-09-14 DIAGNOSIS — M79606 Pain in leg, unspecified: Secondary | ICD-10-CM

## 2016-09-29 ENCOUNTER — Telehealth: Payer: Self-pay | Admitting: Cardiovascular Disease

## 2016-09-29 NOTE — Telephone Encounter (Signed)
[email or web address] 

## 2016-09-29 NOTE — Telephone Encounter (Signed)
New message   Pt needs a new letter sent to her employer stating the the 1st day she was out of work from July 27th and returning August 7th with no restrictions. This can be emailed to patient: [email or web address]

## 2016-09-30 ENCOUNTER — Encounter: Payer: Self-pay | Admitting: *Deleted

## 2016-09-30 NOTE — Telephone Encounter (Signed)
Spoke with pt, letter generated and faxed to [phone] at patients request.

## 2016-11-19 ENCOUNTER — Other Ambulatory Visit (HOSPITAL_COMMUNITY): Payer: Self-pay | Admitting: Specialist

## 2016-11-19 ENCOUNTER — Ambulatory Visit (HOSPITAL_COMMUNITY)
Admission: RE | Admit: 2016-11-19 | Discharge: 2016-11-19 | Disposition: A | Discharge status: Home / Self Care | Payer: 59 | Source: Ambulatory Visit | Attending: Orthopedic Surgery | Admitting: Orthopedic Surgery

## 2016-11-19 DIAGNOSIS — M79605 Pain in left leg: Secondary | ICD-10-CM

## 2016-11-19 DIAGNOSIS — M7989 Other specified soft tissue disorders: Secondary | ICD-10-CM | POA: Diagnosis not present

## 2016-11-19 DIAGNOSIS — M25562 Pain in left knee: Secondary | ICD-10-CM | POA: Insufficient documentation

## 2016-11-19 NOTE — Progress Notes (Signed)
Left lower extremity venous duplex has been completed. Negative for DVT. Results were [facility] at Dr. Ermelinda DasBeane's office.  11/19/16 3:28 PM Olen CordialGreg Ereka Brau RVT

## 2016-12-22 ENCOUNTER — Encounter (HOSPITAL_COMMUNITY): Payer: Self-pay

## 2016-12-22 ENCOUNTER — Emergency Department (HOSPITAL_COMMUNITY): Payer: Worker's Compensation

## 2016-12-22 ENCOUNTER — Emergency Department (HOSPITAL_COMMUNITY)
Admission: EM | Admit: 2016-12-22 | Discharge: 2016-12-22 | Disposition: A | Discharge status: Home / Self Care | Payer: Worker's Compensation | Attending: Emergency Medicine | Admitting: Emergency Medicine

## 2016-12-22 DIAGNOSIS — I10 Essential (primary) hypertension: Secondary | ICD-10-CM | POA: Diagnosis not present

## 2016-12-22 DIAGNOSIS — Y929 Unspecified place or not applicable: Secondary | ICD-10-CM | POA: Insufficient documentation

## 2016-12-22 DIAGNOSIS — Y939 Activity, unspecified: Secondary | ICD-10-CM | POA: Diagnosis not present

## 2016-12-22 DIAGNOSIS — W000XXA Fall on same level due to ice and snow, initial encounter: Secondary | ICD-10-CM | POA: Diagnosis not present

## 2016-12-22 DIAGNOSIS — S0003XA Contusion of scalp, initial encounter: Secondary | ICD-10-CM

## 2016-12-22 DIAGNOSIS — Y999 Unspecified external cause status: Secondary | ICD-10-CM | POA: Diagnosis not present

## 2016-12-22 DIAGNOSIS — S161XXA Strain of muscle, fascia and tendon at neck level, initial encounter: Secondary | ICD-10-CM | POA: Diagnosis not present

## 2016-12-22 DIAGNOSIS — S0990XA Unspecified injury of head, initial encounter: Secondary | ICD-10-CM

## 2016-12-22 DIAGNOSIS — E039 Hypothyroidism, unspecified: Secondary | ICD-10-CM | POA: Diagnosis not present

## 2016-12-22 DIAGNOSIS — J0100 Acute maxillary sinusitis, unspecified: Secondary | ICD-10-CM

## 2016-12-22 DIAGNOSIS — Z7982 Long term (current) use of aspirin: Secondary | ICD-10-CM | POA: Insufficient documentation

## 2016-12-22 IMAGING — CT CT HEAD W/O CM
5 of 7 series · 16 of 47 positions shown, 17 images · non-contrast
Comparison: CT head [DATE]; CT cervical spine [DATE]

CLINICAL DATA: Pain following fall

EXAM:
CT HEAD WITHOUT CONTRAST
CT CERVICAL SPINE WITHOUT CONTRAST
TECHNIQUE: Multidetector CT imaging of the head and cervical spine was
performed following the standard protocol without intravenous
contrast. Multiplanar CT image reconstructions of the cervical spine
were also generated.

[Series 3: head wo · axial · 0.47mm/px · z∈[-74,-29]mm · 2 of 28 slices shown, 3 images]
[im 10/28  brain]
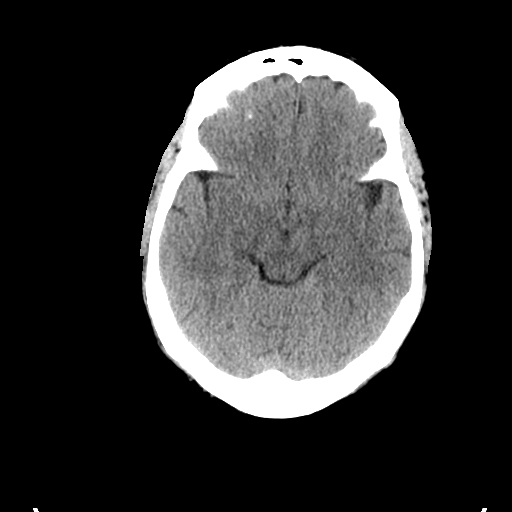
[im 10/28  bone]
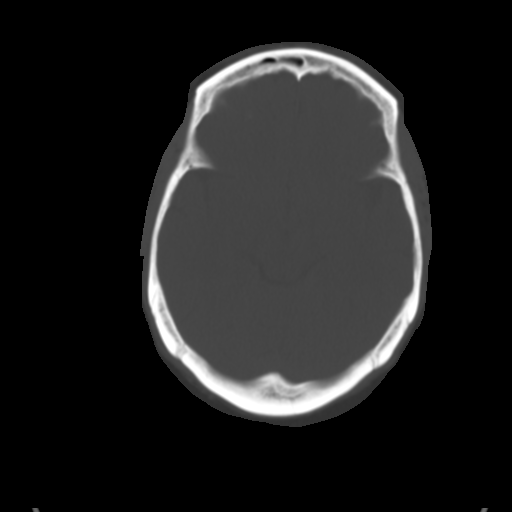
[im 19/28  brain]
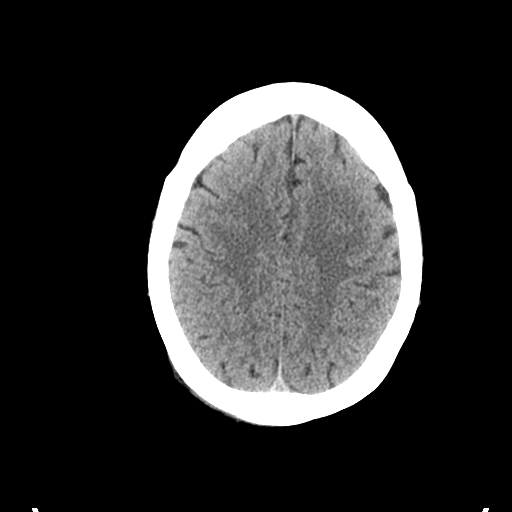

[Series 6: coronal soft tissue · coronal · 0.27mm/px · 2 of 72 slices shown]
[im 5/72  brain]
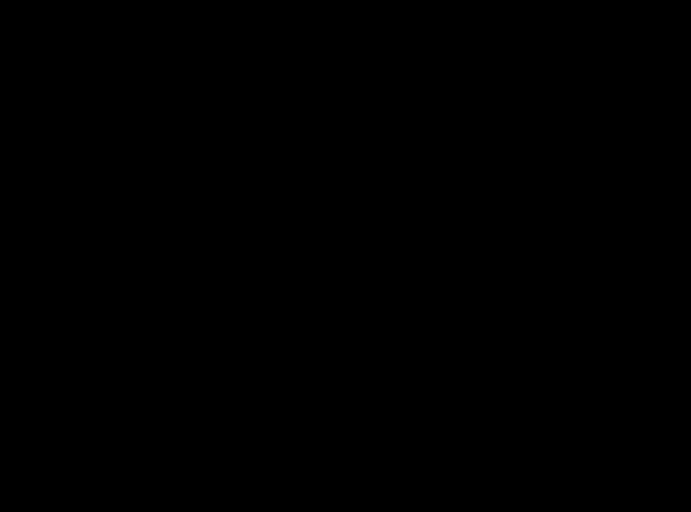
[im 9/72  brain]
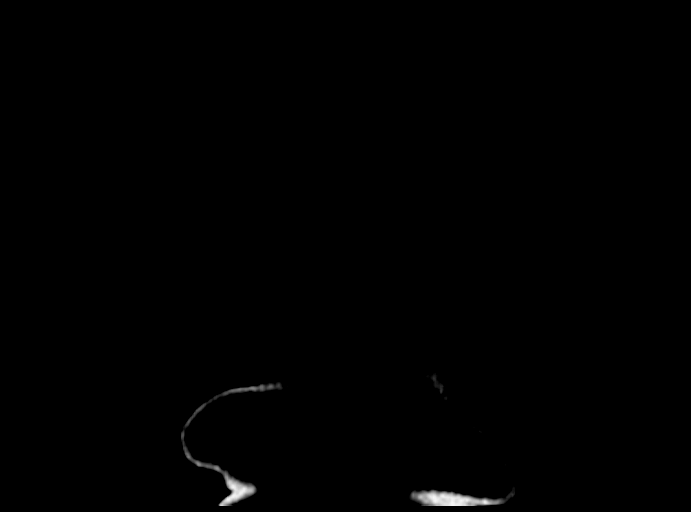

[Series 7: sagittal soft tissue · sagittal · 0.28mm/px · 1 of 59 slices shown]
[im 30/59  brain]
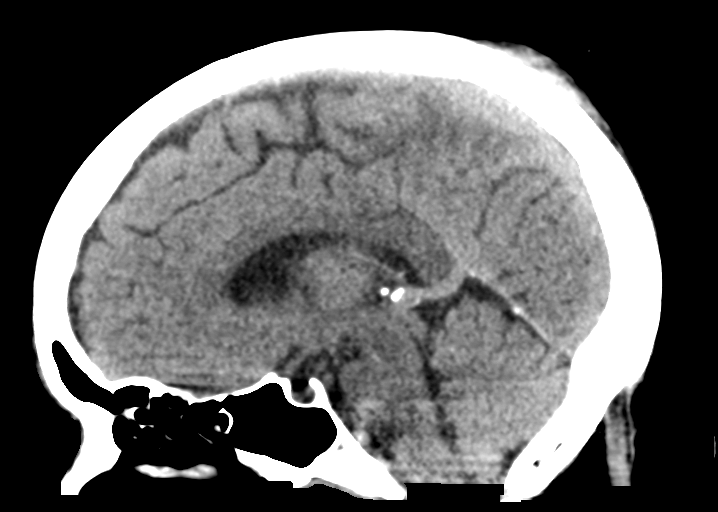

[Series 9: c spine soft · axial · 0.29mm/px · z∈[-288,-240]mm · 3 of 95 slices shown]
[im 8/95  brain]
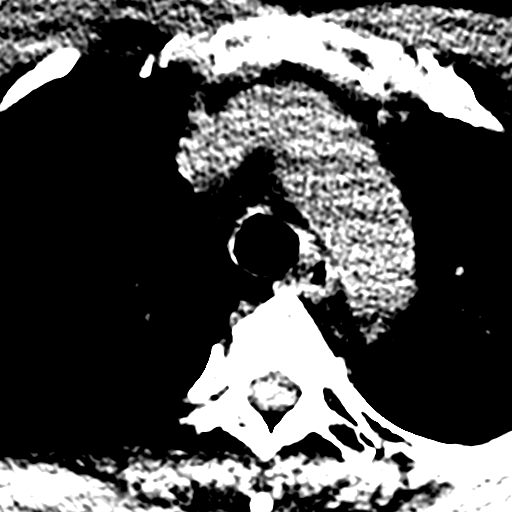
[im 24/95  brain]
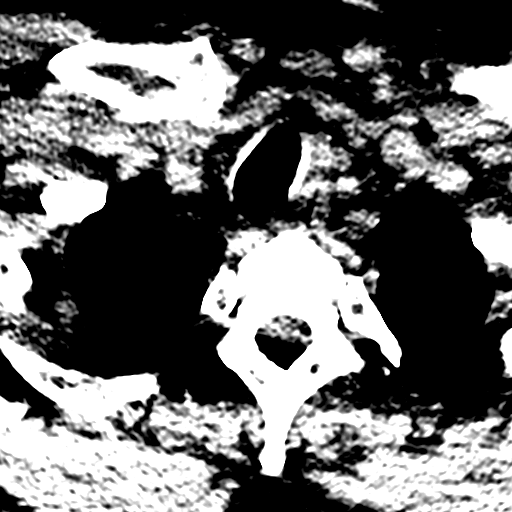
[im 32/95  brain]
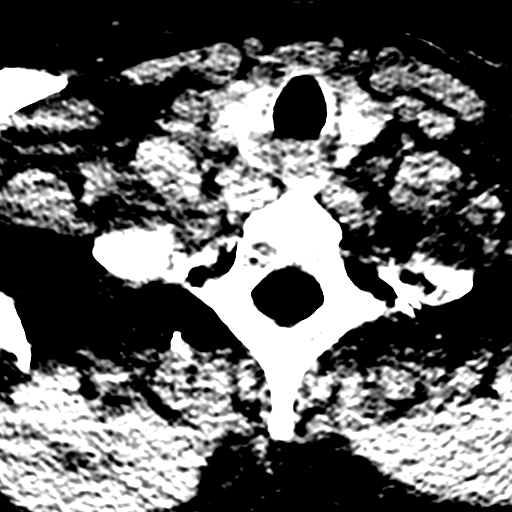

[Series 11: orthogonal bone · axial · 0.23mm/px · z∈[-294,-151]mm · 8 of 90 slices shown]
[im 8/90  bone]
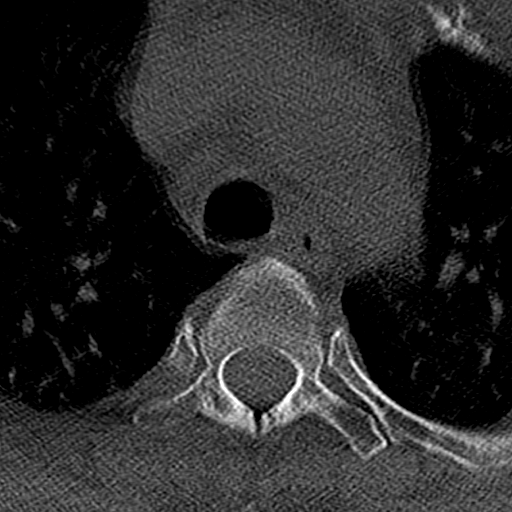
[im 23/90  bone]
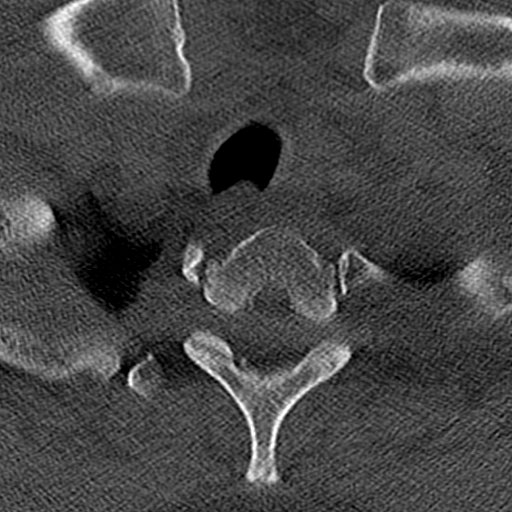
[im 30/90  bone]
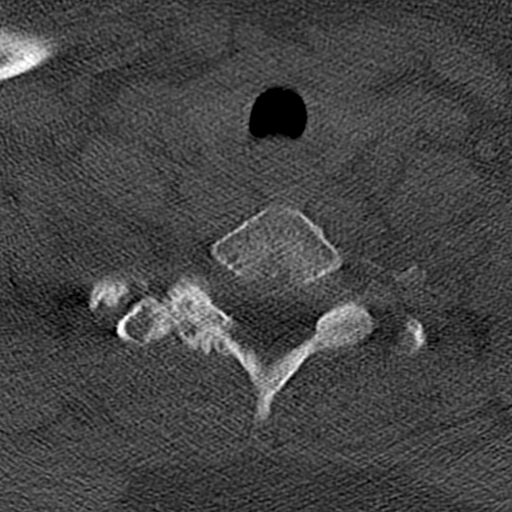
[im 38/90  bone]
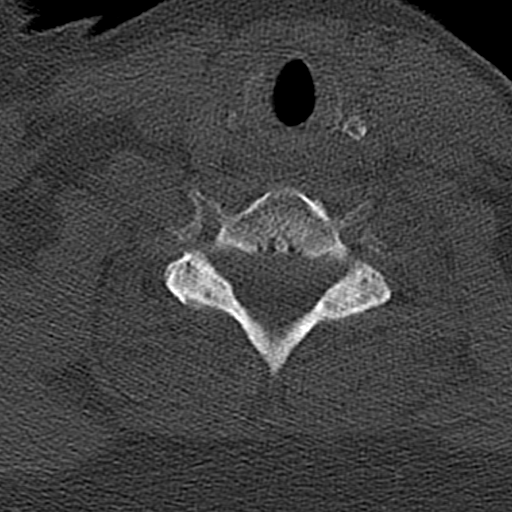
[im 52/90  bone]
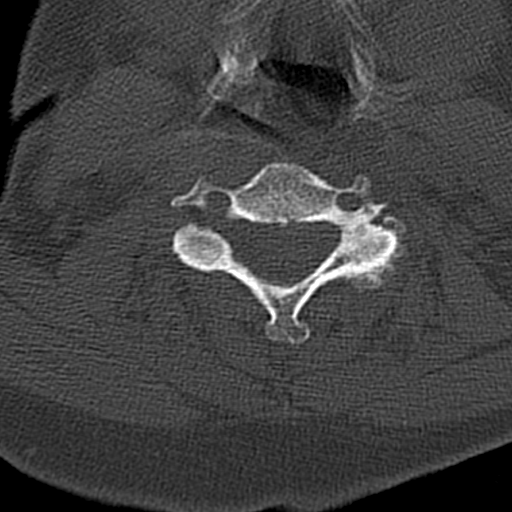
[im 60/90  bone]
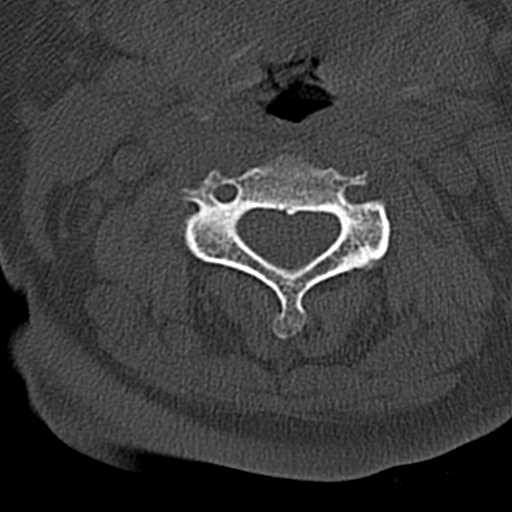
[im 67/90  bone]
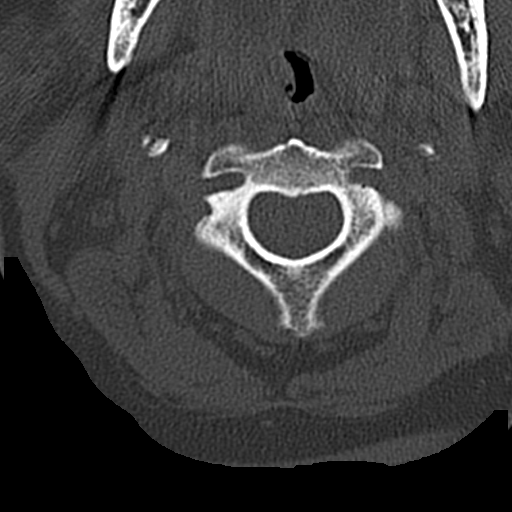
[im 82/90  bone]
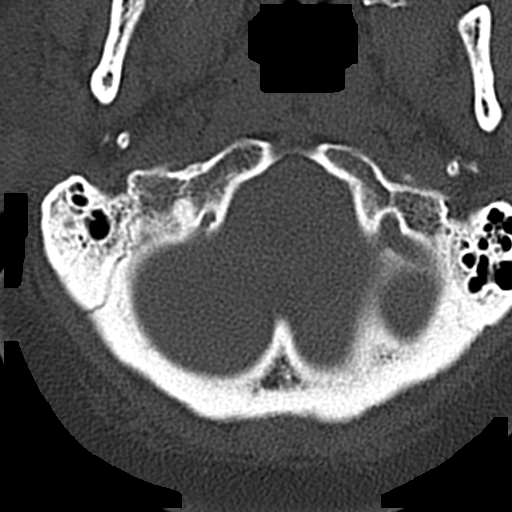

[16 of 47 positions shown; findings below may reference images not displayed]

FINDINGS: CT HEAD FINDINGS

Brain: The ventricles are normal in size and configuration. There is
no intracranial mass, hemorrhage, extra-axial fluid collection, or
midline shift. Gray-white compartments appear normal. No acute
infarct evident.

Vascular: No hyperdense vessel. No appreciable vascular
calcification evident.

Skull: There is a a right parietal scalp hematoma. Bony calvarium
appears intact.

Sinuses/Orbits: There is mucosal thickening in multiple ethmoid air
cells. There is mucosal thickening in the superior left maxillary
antrum. Other visualized paranasal sinuses are clear. Orbits appear
symmetric bilaterally.

Other: Mastoid air cells are clear.

CT CERVICAL SPINE FINDINGS

Alignment: There is no appreciable spondylolisthesis.

Skull base and vertebrae: Skull base and craniocervical junction
regions appear normal. No acute fracture is evident. There are no
blastic or lytic bone lesions. There is calcification posterior to
the C7 spinous process, a stable finding that potentially could
represent residua of prior avulsion in this area.

Soft tissues and spinal canal: Prevertebral soft tissues and
predental space regions are normal. No paraspinous lesions. No
evident cord or canal hematoma.

Disc levels: There is no appreciable disc space narrowing. There is
facet hypertrophy at several levels. There is no appreciable nerve
root edema or effacement. No disc extrusion or stenosis evident.

Upper chest: Visualized upper lung zones are clear. There is aortic
atherosclerosis.

Other: There are foci of calcification in each carotid artery.
IMPRESSION: CT head: Right parietal scalp hematoma with underlying bone intact.
Areas of paranasal sinus disease. No intracranial mass hemorrhage,
or extra-axial fluid. Gray-white compartments appear normal.

CT cervical spine: No acute fracture or spondylolisthesis. Question
of old avulsion off the posterior most aspect of the C7 spinous
process, stable in appearance.

Osteoarthritic change at several levels. No nerve root edema or
effacement evident. No disc extrusion.

Aortic atherosclerosis. Foci of carotid artery calcification
bilaterally.

Aortic Atherosclerosis ([S1]-[S1]).

## 2016-12-22 IMAGING — CR DG CHEST 2V
2 series · 2 of 2 positions shown · non-contrast
Comparison: Chest x-ray of [DATE]

CLINICAL DATA: Fell at work this morning. The patient reports 3
weeks of cough with persistent symptoms despite antibiotics. Patient
also reports upper chest discomfort. History of hypertension.

EXAM:
CHEST  2 VIEW

[w chest pa]
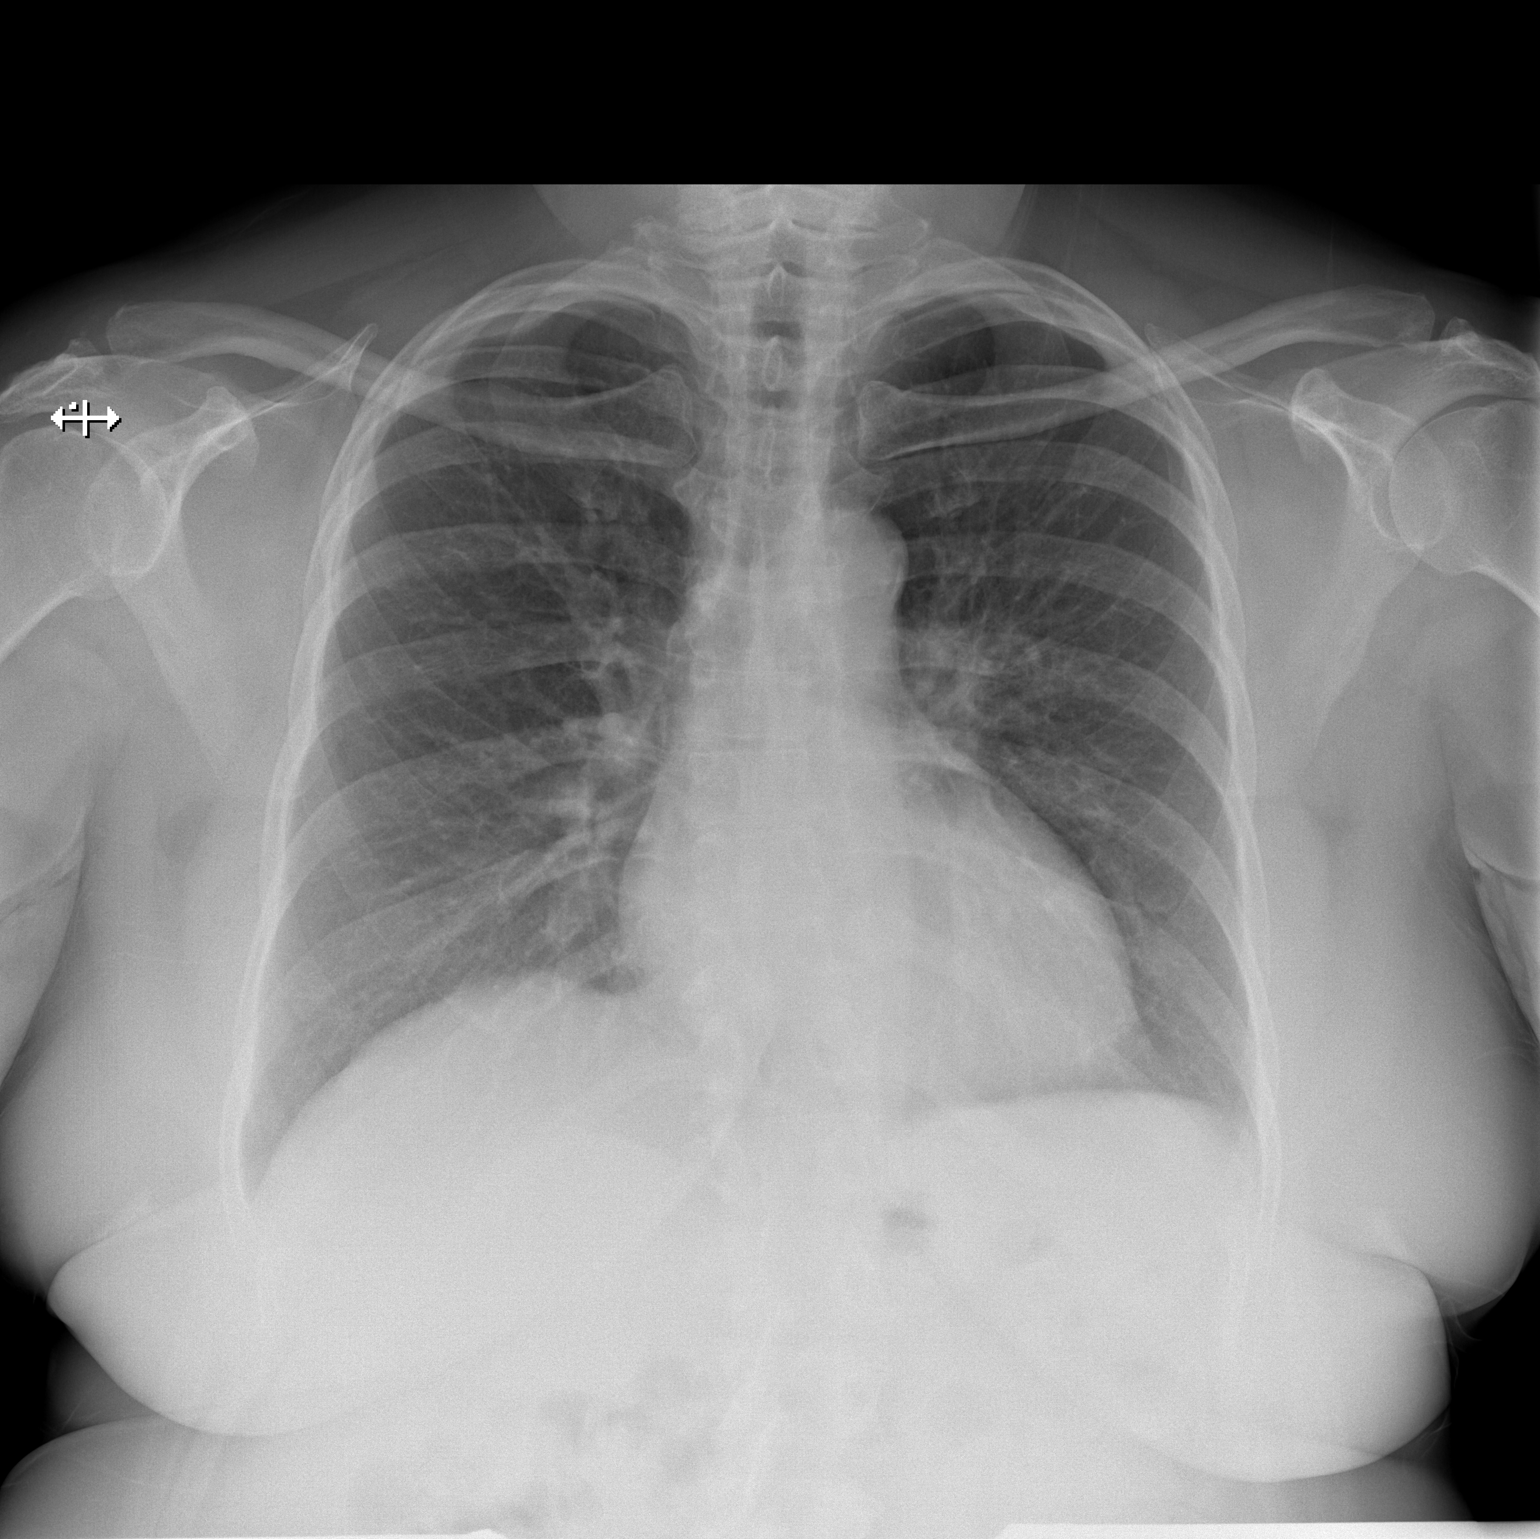

[w chest lat]
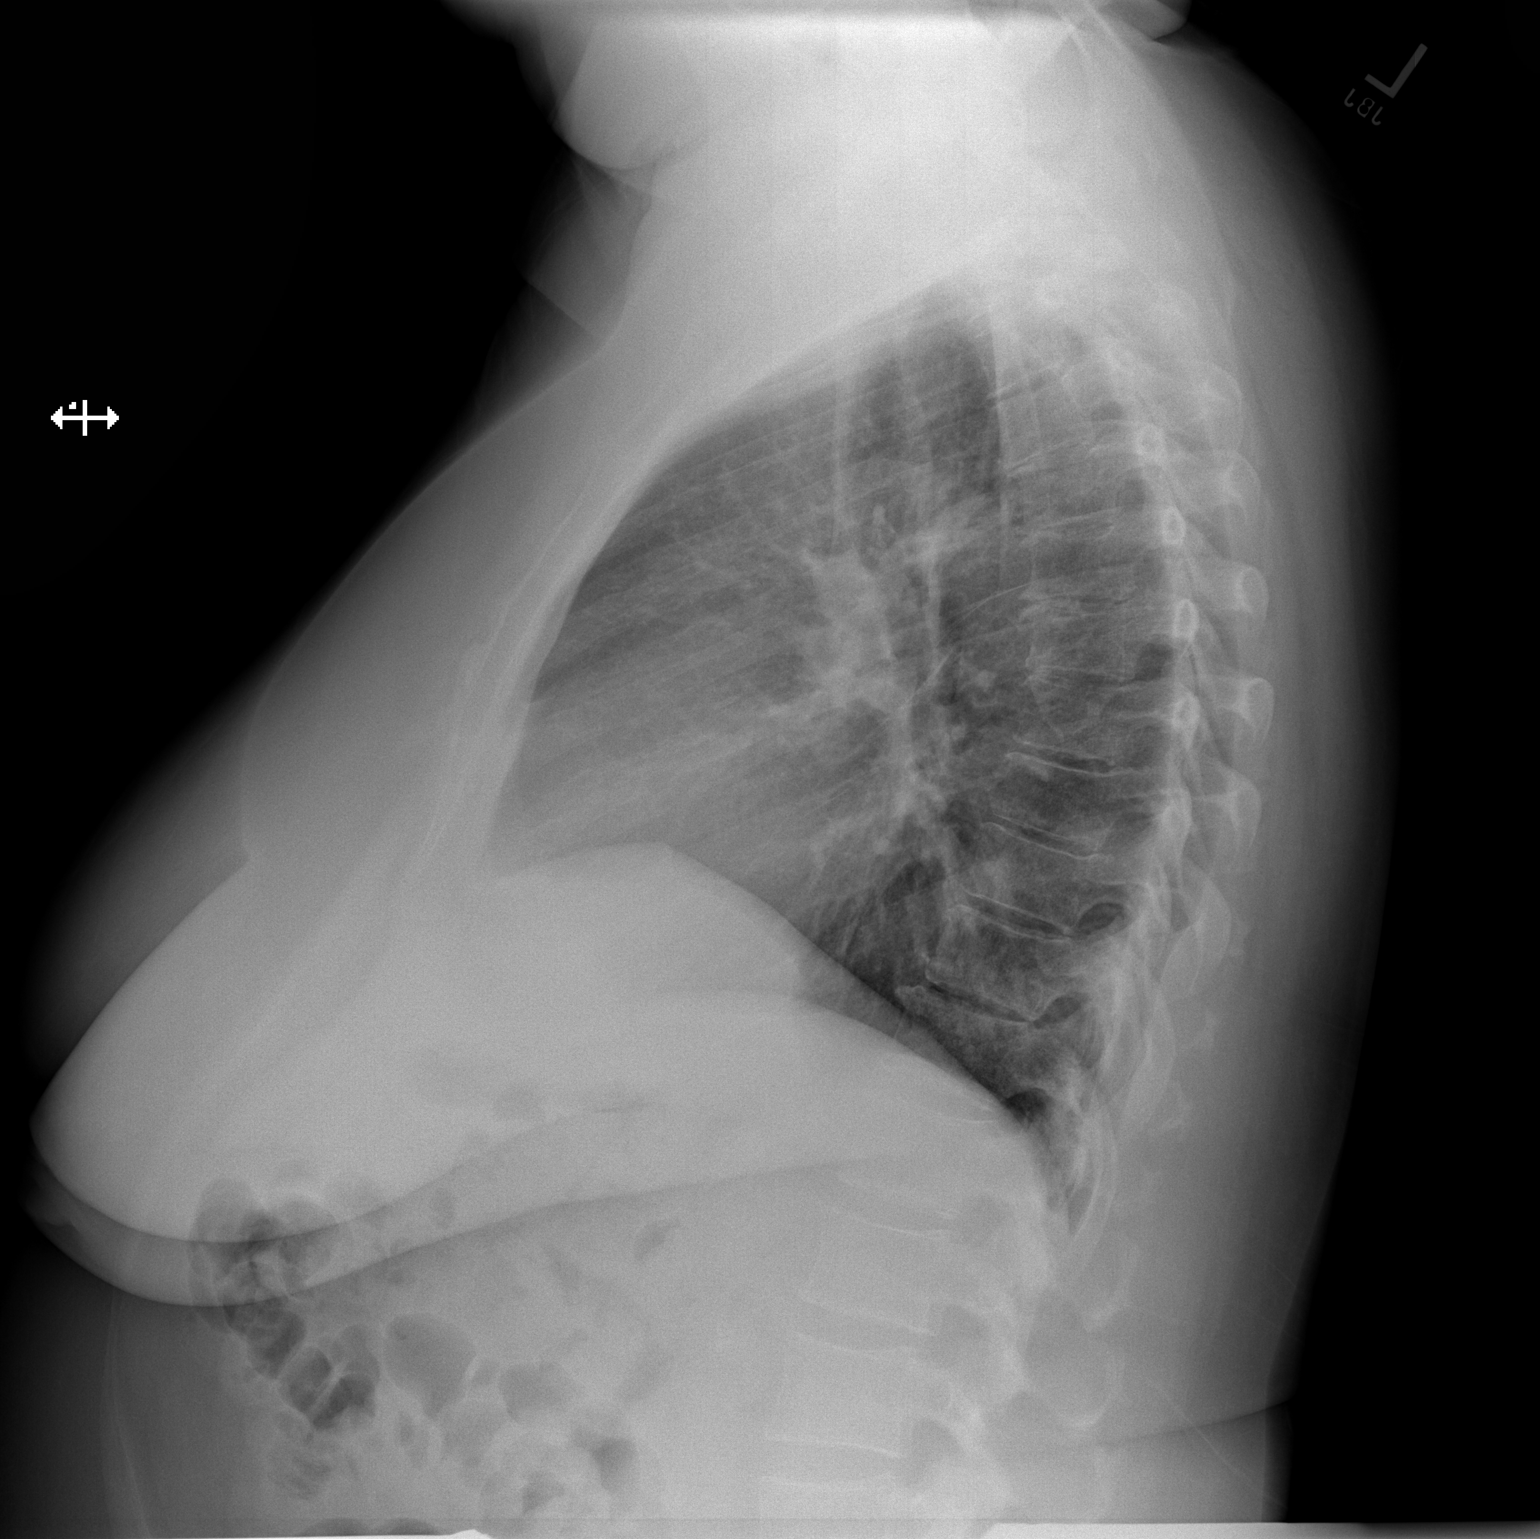

[2 of 2 positions shown; findings below may reference images not displayed]

FINDINGS: The lungs are well-expanded and clear. The heart and pulmonary
vascularity are normal. The mediastinum is normal in width. There is
no pleural effusion. The bony thorax exhibits no acute abnormality.
IMPRESSION: There is no pneumonia nor other acute cardiopulmonary abnormality.

## 2016-12-22 MED ORDER — IBUPROFEN 600 MG PO TABS: 600.0000 mg | ORAL_TABLET | Freq: Four times a day (QID) | ORAL | 0 refills | Status: DC | PRN

## 2016-12-22 MED ORDER — METHOCARBAMOL 500 MG PO TABS
1000.0000 mg | ORAL_TABLET | Freq: Once | ORAL | Status: AC
  Administered 2016-12-22: 1000 mg via ORAL
  Filled 2016-12-22: qty 2

## 2016-12-22 MED ORDER — LEVOCETIRIZINE DIHYDROCHLORIDE 5 MG PO TABS
5.0000 mg | ORAL_TABLET | Freq: Every day | ORAL | 0 refills | Status: DC | PRN
Start: 2016-12-22 — End: 2017-12-11

## 2016-12-22 MED ORDER — KETOROLAC TROMETHAMINE 60 MG/2ML IM SOLN
60.0000 mg | Freq: Once | INTRAMUSCULAR | Status: AC
  Administered 2016-12-22: 60 mg via INTRAMUSCULAR
  Filled 2016-12-22: qty 2

## 2016-12-22 MED ORDER — METHOCARBAMOL 500 MG PO TABS: 1000.0000 mg | ORAL_TABLET | Freq: Three times a day (TID) | ORAL | 0 refills | Status: DC | PRN

## 2016-12-22 MED ORDER — FLUTICASONE PROPIONATE 50 MCG/ACT NA SUSP: 1.0000 | Freq: Every day | NASAL | 0 refills | Status: DC | PRN

## 2016-12-22 NOTE — ED Triage Notes (Signed)
Pt. Arrived [facility] from work getting out of car and fail on ice. Hit the back of her right side of head. Small knot. No bleeding. C/O of right hip pain. Denies back pain or LOC.

## 2016-12-22 NOTE — ED Notes (Signed)
Bed: [facility] Expected date:  Expected time:  Means of arrival:  Comments: EMS-hip pain 

## 2016-12-22 NOTE — ED Provider Notes (Signed)
[facility]-EMERGENCY DEPT Provider Note   CSN: [id number] Arrival date & time: 12/22/16  0901     History   Chief Complaint Chief Complaint  Patient presents with  . Fall    HPI [name] is a 56 y.o. female.  HPI Patient states she was getting out of the car and slipped on ice.  She fell onto her right side.  Struck her head.  Denied loss of consciousness.  Currently complaining of headache, neck pain and right-sided hip pain.  No focal weakness or numbness.  Next.  Patient also states she has had ongoing cough.  Recently seen by her primary physician and diagnosed with bronchitis.  She has been on steroids with little improvement.  No fever or chills. Past Medical History:  Diagnosis Date  . Allergy   . Arthritis    back   . Atypical chest pain 06/30/2016  . Back pain    DUE TO INJURY AT WORK ON05/2013  . Bruises easily   . Chest pain 06/30/2016  . Chronic lower back pain   . Depression    was on meds 2 yrs ago   . Essential hypertension 06/30/2016  . Headache(784.0)    rarely  . History of bronchitis    last time about 20yrs ago   . Hypertension    takes Benicar daily  . Hypothyroidism 06/30/2016  . Vitamin D deficiency    takes Vitamin D 2 times/wk  . Weakness    and numbness right foot    Patient Active Problem List   Diagnosis Date Noted  . Abnormal stress test 08/06/2016  . Essential hypertension 06/30/2016  . Hypothyroidism 06/30/2016  . Atypical chest pain 06/30/2016    Past Surgical History:  Procedure Laterality Date  . BACK SURGERY    . CARPAL TUNNEL RELEASE Right   . CESAREAN SECTION  1988  . COLONOSCOPY    . EXPLORATORY LAPAROTOMY  1991   "took out fatty tumor" (11/09/2012)  . LEFT HEART CATH AND CORONARY ANGIOGRAPHY N/A 08/06/2016   Procedure: Left Heart Cath and Coronary Angiography;  Surgeon: Lyn Records, MD;  Location: [facility] INVASIVE CV LAB;  Service: Cardiovascular;  Laterality: N/A;  . LUMBAR  LAMINECTOMY/DECOMPRESSION MICRODISCECTOMY  11/09/2012   "L3-4" (11/09/2012)  . LUMBAR LAMINECTOMY/DECOMPRESSION MICRODISCECTOMY N/A 11/09/2012   Procedure: L3-L4 DECOMPRESSION AND MICRODISCECTOMY   (1 LEVEL);  Surgeon: Venita Lick, MD;  Location: [facility] OR;  Service: Orthopedics;  Laterality: N/A;    OB History    No data available       Home Medications    Prior to Admission medications   Medication Sig Start Date End Date Taking? Authorizing Provider  aspirin EC 81 MG tablet Take 81 mg by mouth daily.    [provider]  fluticasone (FLONASE) 50 MCG/ACT nasal spray Place 1 spray into both nostrils daily as needed for allergies or rhinitis. 12/22/16   Loren Racer, MD  ibuprofen (ADVIL,MOTRIN) 600 MG tablet Take 1 tablet (600 mg total) by mouth every 6 (six) hours as needed. 12/22/16   Loren Racer, MD  levocetirizine (XYZAL) 5 MG tablet Take 1 tablet (5 mg total) by mouth daily as needed for allergies. 12/22/16   Loren Racer, MD  Lorcaserin HCl (BELVIQ) 10 MG TABS Take 1 tablet by mouth 2 (two) times daily.    [provider]  methocarbamol (ROBAXIN) 500 MG tablet Take 2 tablets (1,000 mg total) by mouth every 8 (eight) hours as needed for muscle spasms.  12/22/16   Loren Racer, MD  Multiple Vitamins-Minerals (WOMENS MULTIVITAMIN PO) Take 1 capsule by mouth daily.    [provider]  olmesartan-hydrochlorothiazide (BENICAR HCT) 40-25 MG tablet Take 1 tablet by mouth daily. 07/23/16   Chilton Si, MD    Family History Family History  Problem Relation Age of Onset  . Heart disease Mother   . CAD Mother   . Stroke Mother   . Heart disease Sister   . Liver disease Brother   . Colon cancer Neg Hx   . Esophageal cancer Neg Hx   . Rectal cancer Neg Hx   . Stomach cancer Neg Hx     Social History Social History   Tobacco Use  . Smoking status: Never Smoker  . Smokeless tobacco: Never Used  Substance Use Topics  . Alcohol use: Yes     Alcohol/week: 4.2 oz    Types: 7 Glasses of wine per week    Comment: 11/09/2012 "glass of wine q night"  . Drug use: No     Allergies   Patient has no known allergies.   Review of Systems Review of Systems  Constitutional: Negative for chills and fever.  HENT: Negative for facial swelling.   Eyes: Negative for visual disturbance.  Respiratory: Positive for cough. Negative for shortness of breath.   Cardiovascular: Negative for chest pain and leg swelling.  Gastrointestinal: Negative for abdominal pain, constipation, diarrhea, nausea and vomiting.  Musculoskeletal: Positive for myalgias and neck pain. Negative for arthralgias, back pain and neck stiffness.  Skin: Negative for rash and wound.  Neurological: Positive for headaches. Negative for dizziness, syncope, weakness, light-headedness and numbness.  Psychiatric/Behavioral: Positive for dysphoric mood.  All other systems reviewed and are negative.    Physical Exam Updated Vital Signs BP (!) 147/82 (BP Location: Left Arm)   Pulse 73   Temp 98.7 F (37.1 C) (Oral)   Resp 18   LMP 06/03/2011   SpO2 99%   Physical Exam  Constitutional: She is oriented to person, place, and time. She appears well-developed and well-nourished.  Tearful  HENT:  Head: Normocephalic.  Mouth/Throat: Oropharynx is clear and moist.  Patient with moderate size hematoma to the right posterior scalp.  No postauricular bruising or periorbital bruising.  Midface is stable.  Eyes: EOM are normal. Pupils are equal, round, and reactive to light.  Neck: Normal range of motion. Neck supple.  Patient does have some lower cervical midline tenderness to palpation.  No step-offs or deformity.  Cardiovascular: Normal rate and regular rhythm. Exam reveals no gallop and no friction rub.  No murmur heard. Pulmonary/Chest: Effort normal. No respiratory distress. She has no wheezes. She has rales.  Few scattered rales.  Abdominal: Soft. Bowel sounds are  normal. There is no tenderness. There is no rebound and no guarding.  Musculoskeletal: Normal range of motion. She exhibits no edema or tenderness.  No midline thoracic or lumbar tenderness.  Patient has full range motion of bilateral hips.  She does have diffuse right lateral thigh tenderness to palpation.  Distal pulses are 2+.  Neurological: She is alert and oriented to person, place, and time.  5/5 motor in all extremities.  Sensation fully intact.  Skin: Skin is warm and dry. No rash noted. No erythema.  Psychiatric: Her behavior is normal.  Nursing note and vitals reviewed.    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 The patient reports 3 weeks of cough with persistent symptoms despite antibiotics. Patient also reports upper chest discomfort. History of hypertension. EXAM: CHEST  2 VIEW COMPARISON:  Chest x-ray of February 06, 2015 FINDINGS: The lungs are well-expanded and clear. The heart and pulmonary vascularity are normal. The mediastinum is normal in width. There is no pleural effusion. The bony thorax exhibits no acute abnormality. IMPRESSION: There is no pneumonia nor other acute cardiopulmonary abnormality. Electronically Signed   By: Derrian Poli  [place] M.D.   On: 12/22/2016 10:15   Ct Head Wo Contrast  Result Date: 12/22/2016 CLINICAL DATA:  Pain following fall EXAM: CT HEAD WITHOUT CONTRAST CT CERVICAL SPINE WITHOUT CONTRAST TECHNIQUE: Multidetector CT imaging of the head and cervical spine was performed following the standard protocol without intravenous contrast. Multiplanar CT image reconstructions of the cervical spine were also generated. COMPARISON:  CT head Jun 04, 2014; CT cervical spine February 06, 2015 FINDINGS: CT HEAD FINDINGS Brain: The  ventricles are normal in size and configuration. There is no intracranial mass, hemorrhage, extra-axial fluid collection, or midline shift. Gray-white compartments appear normal. No acute infarct evident. Vascular: No hyperdense vessel. No appreciable vascular calcification evident. Skull: There is a a right parietal scalp hematoma. Bony calvarium appears intact. Sinuses/Orbits: There is mucosal thickening in multiple ethmoid air cells. There is mucosal thickening in the superior left maxillary antrum. Other visualized paranasal sinuses are clear. Orbits appear symmetric bilaterally. Other: Mastoid air cells are clear. CT CERVICAL SPINE FINDINGS Alignment: There is no appreciable spondylolisthesis. Skull base and vertebrae: Skull base and craniocervical junction regions appear normal. No acute fracture is evident. There are no blastic or lytic bone lesions. There is calcification posterior to the C7 spinous process, a stable finding that potentially could represent residua of prior avulsion in this area. Soft tissues and spinal canal: Prevertebral soft tissues and predental space regions are normal. No paraspinous lesions. No evident cord or canal hematoma. Disc levels: There is no appreciable disc space narrowing. There is facet hypertrophy at several levels. There is no appreciable nerve root edema or effacement. No disc extrusion or stenosis evident. Upper chest: Visualized upper lung zones are clear. There is aortic atherosclerosis. Other: There are foci of calcification in each carotid artery. IMPRESSION: CT head: Right parietal scalp hematoma with underlying bone intact. Areas of paranasal sinus disease. No intracranial mass hemorrhage, or extra-axial fluid. Gray-white compartments appear normal. CT cervical spine: No acute fracture or spondylolisthesis. Question of old avulsion off the posterior most aspect of the C7 spinous process, stable in appearance. Osteoarthritic change at several levels. No nerve root  edema or effacement evident. No disc extrusion. Aortic atherosclerosis. Foci of carotid artery calcification bilaterally. Aortic Atherosclerosis (ICD10-I70.0). Electronically Signed   By: Bretta BangWilliam  Woodruff III M.D.   On: 12/22/2016 10:33   Ct Cervical Spine Wo Contrast  Result Date: 12/22/2016 CLINICAL DATA:  Pain following fall EXAM: CT HEAD WITHOUT CONTRAST CT CERVICAL SPINE WITHOUT CONTRAST TECHNIQUE: Multidetector CT imaging of the head and cervical spine was performed following the standard protocol without intravenous contrast. Multiplanar CT image reconstructions of the cervical spine were also generated. COMPARISON:  CT head Jun 04, 2014; CT cervical spine February 06, 2015 FINDINGS: CT HEAD FINDINGS Brain: The ventricles are normal in size and configuration. There is no intracranial mass, hemorrhage, extra-axial fluid collection, or  midline shift. Gray-white compartments appear normal. No acute infarct evident. Vascular: No hyperdense vessel. No appreciable vascular calcification evident. Skull: There is a a right parietal scalp hematoma. Bony calvarium appears intact. Sinuses/Orbits: There is mucosal thickening in multiple ethmoid air cells. There is mucosal thickening in the superior left maxillary antrum. Other visualized paranasal sinuses are clear. Orbits appear symmetric bilaterally. Other: Mastoid air cells are clear. CT CERVICAL SPINE FINDINGS Alignment: There is no appreciable spondylolisthesis. Skull base and vertebrae: Skull base and craniocervical junction regions appear normal. No acute fracture is evident. There are no blastic or lytic bone lesions. There is calcification posterior to the C7 spinous process, a stable finding that potentially could represent residua of prior avulsion in this area. Soft tissues and spinal canal: Prevertebral soft tissues and predental space regions are normal. No paraspinous lesions. No evident cord or canal hematoma. Disc levels: There is no appreciable  disc space narrowing. There is facet hypertrophy at several levels. There is no appreciable nerve root edema or effacement. No disc extrusion or stenosis evident. Upper chest: Visualized upper lung zones are clear. There is aortic atherosclerosis. Other: There are foci of calcification in each carotid artery. IMPRESSION: CT head: Right parietal scalp hematoma with underlying bone intact. Areas of paranasal sinus disease. No intracranial mass hemorrhage, or extra-axial fluid. Gray-white compartments appear normal. CT cervical spine: No acute fracture or spondylolisthesis. Question of old avulsion off the posterior most aspect of the C7 spinous process, stable in appearance. Osteoarthritic change at several levels. No nerve root edema or effacement evident. No disc extrusion. Aortic atherosclerosis. Foci of carotid artery calcification bilaterally. Aortic Atherosclerosis (ICD10-I70.0). Electronically Signed   By: Bretta BangWilliam  Woodruff III M.D.   On: 12/22/2016 10:33    Procedures Procedures (including critical care time)  Medications Ordered in ED Medications  ketorolac (TORADOL) injection 60 mg (60 mg Intramuscular Given 12/22/16 1130)  methocarbamol (ROBAXIN) tablet 1,000 mg (1,000 mg Oral Given 12/22/16 1131)     Initial Impression / Assessment and Plan / ED Course  I have reviewed the triage vital signs and the nursing notes.  Pertinent labs & imaging results that were available during my care of the patient were reviewed by me and considered in my medical decision making (see chart for details).     X-rays without acute traumatic findings.  Patient does have evidence of sinus disease.  Chest x-ray clear.  Head injury precautions given.  Final Clinical Impressions(s) / ED Diagnoses   Final diagnoses:  Contusion of scalp, initial encounter  Closed head injury, initial encounter  Acute strain of neck muscle, initial encounter  Acute maxillary sinusitis, recurrence not specified    ED  Discharge Orders        Ordered    fluticasone (FLONASE) 50 MCG/ACT nasal spray  Daily PRN     12/22/16 1141    levocetirizine (XYZAL) 5 MG tablet  Daily PRN     12/22/16 1141    methocarbamol (ROBAXIN) 500 MG tablet  Every 8 hours PRN     12/22/16 1141    ibuprofen (ADVIL,MOTRIN) 600 MG tablet  Every 6 hours PRN     12/22/16 1141       Loren RacerYelverton, Lanna Labella, MD 12/22/16 [phone]

## 2016-12-25 ENCOUNTER — Encounter (HOSPITAL_COMMUNITY): Payer: Self-pay

## 2016-12-25 ENCOUNTER — Other Ambulatory Visit: Payer: Self-pay

## 2016-12-25 ENCOUNTER — Emergency Department (HOSPITAL_COMMUNITY)
Admission: EM | Admit: 2016-12-25 | Discharge: 2016-12-25 | Disposition: A | Discharge status: Home / Self Care | Payer: 59 | Attending: Emergency Medicine | Admitting: Emergency Medicine

## 2016-12-25 DIAGNOSIS — W000XXD Fall on same level due to ice and snow, subsequent encounter: Secondary | ICD-10-CM | POA: Diagnosis not present

## 2016-12-25 DIAGNOSIS — G44309 Post-traumatic headache, unspecified, not intractable: Secondary | ICD-10-CM | POA: Diagnosis not present

## 2016-12-25 DIAGNOSIS — R6883 Chills (without fever): Secondary | ICD-10-CM | POA: Insufficient documentation

## 2016-12-25 DIAGNOSIS — R1032 Left lower quadrant pain: Secondary | ICD-10-CM | POA: Diagnosis present

## 2016-12-25 DIAGNOSIS — R110 Nausea: Secondary | ICD-10-CM | POA: Insufficient documentation

## 2016-12-25 DIAGNOSIS — N939 Abnormal uterine and vaginal bleeding, unspecified: Secondary | ICD-10-CM | POA: Diagnosis not present

## 2016-12-25 DIAGNOSIS — I10 Essential (primary) hypertension: Secondary | ICD-10-CM | POA: Diagnosis not present

## 2016-12-25 DIAGNOSIS — E039 Hypothyroidism, unspecified: Secondary | ICD-10-CM | POA: Insufficient documentation

## 2016-12-25 DIAGNOSIS — Z79899 Other long term (current) drug therapy: Secondary | ICD-10-CM | POA: Insufficient documentation

## 2016-12-25 DIAGNOSIS — Z7982 Long term (current) use of aspirin: Secondary | ICD-10-CM | POA: Diagnosis not present

## 2016-12-25 LAB — URINALYSIS, ROUTINE W REFLEX MICROSCOPIC
Bacteria, UA: NONE SEEN
Bilirubin Urine: NEGATIVE
Glucose, UA: NEGATIVE mg/dL
Hgb urine dipstick: NEGATIVE
Ketones, ur: NEGATIVE mg/dL
Nitrite: NEGATIVE
Protein, ur: NEGATIVE mg/dL
Specific Gravity, Urine: 1.018 (ref 1.005–1.030)
pH: 6 (ref 5.0–8.0)

## 2016-12-25 LAB — CBC
HCT: 38.5 % (ref 36.0–46.0)
Hemoglobin: 12.5 g/dL (ref 12.0–15.0)
MCH: 29.1 pg (ref 26.0–34.0)
MCHC: 32.5 g/dL (ref 30.0–36.0)
MCV: 89.7 fL (ref 78.0–100.0)
Platelets: 314 10*3/uL (ref 150–400)
RBC: 4.29 MIL/uL (ref 3.87–5.11)
RDW: 13.3 % (ref 11.5–15.5)
WBC: 5.2 10*3/uL (ref 4.0–10.5)

## 2016-12-25 LAB — TYPE AND SCREEN
ABO/RH(D): A POS
Antibody Screen: NEGATIVE

## 2016-12-25 LAB — WET PREP, GENITAL
Clue Cells Wet Prep HPF POC: NONE SEEN
Sperm: NONE SEEN
Trich, Wet Prep: NONE SEEN

## 2016-12-25 LAB — COMPREHENSIVE METABOLIC PANEL
ALT: 38 U/L (ref 14–54)
AST: 38 U/L (ref 15–41)
Albumin: 3.8 g/dL (ref 3.5–5.0)
Alkaline Phosphatase: 75 U/L (ref 38–126)
Anion gap: 8 (ref 5–15)
BUN: 14 mg/dL (ref 6–20)
CO2: 27 mmol/L (ref 22–32)
Calcium: 9.1 mg/dL (ref 8.9–10.3)
Chloride: 104 mmol/L (ref 101–111)
Creatinine, Ser: 0.88 mg/dL (ref 0.44–1.00)
GFR calc Af Amer: >60 mL/min (ref >60)
GFR calc non Af Amer: >60 mL/min (ref >60)
Glucose, Bld: 111 mg/dL — ABNORMAL HIGH (ref 65–99)
Potassium: 3.7 mmol/L (ref 3.5–5.1)
Sodium: 139 mmol/L (ref 135–145)
Total Bilirubin: 0.6 mg/dL (ref 0.3–1.2)
Total Protein: 7.7 g/dL (ref 6.5–8.1)

## 2016-12-25 LAB — ABO/RH: ABO/RH(D): A POS

## 2016-12-25 LAB — LIPASE, BLOOD: Lipase: 27 U/L (ref 11–51)

## 2016-12-25 MED ORDER — ONDANSETRON 8 MG PO TBDP: 8.0000 mg | ORAL_TABLET | Freq: Three times a day (TID) | ORAL | 0 refills | Status: DC | PRN

## 2016-12-25 NOTE — Discharge Instructions (Signed)
Follow-up with your GYN doctor for further evaluation as we discussed, take the Zofran as needed for nausea.

## 2016-12-25 NOTE — ED Provider Notes (Signed)
[facility] Provider [facility]   CSN: [id number] Arrival date & time: 12/25/16  1254     History   Chief Complaint Chief Complaint  Patient presents with  . Abdominal Pain  . Vaginal Bleeding    HPI [name] is a 56 y.o. female.  HPI  Pt stopped having menstrual periods a couple of years ago.  Today she noticed vaginal bleeding and discomfort in her lower abdomen.  NO dysuria.  No fevers.  SHe has felt chilled.  She recently was diagnosed with a concussion after falling and hitting her head.  SHe still has some nausea with htat.  No confussion.  No weakness.  Past Medical History:  Diagnosis Date  . Allergy   . Arthritis    back   . Atypical chest pain 06/30/2016  . Back pain    DUE TO INJURY AT WORK ON05/2013  . Bruises easily   . Chest pain 06/30/2016  . Chronic lower back pain   . Depression    was on meds 2 yrs ago   . Essential hypertension 06/30/2016  . Headache(784.0)    rarely  . History of bronchitis    last time about 3815yrs ago   . Hypertension    takes Benicar daily  . Hypothyroidism 06/30/2016  . Vitamin D deficiency    takes Vitamin D 2 times/wk  . Weakness    and numbness right foot    Patient Active Problem List   Diagnosis Date Noted  . Abnormal stress test 08/06/2016  . Essential hypertension 06/30/2016  . Hypothyroidism 06/30/2016  . Atypical chest pain 06/30/2016    Past Surgical History:  Procedure Laterality Date  . BACK SURGERY    . CARPAL TUNNEL RELEASE Right   . CESAREAN SECTION  1988  . COLONOSCOPY    . EXPLORATORY LAPAROTOMY  1991   "took out fatty tumor" (11/09/2012)  . LEFT HEART CATH AND CORONARY ANGIOGRAPHY N/A 08/06/2016   Procedure: Left Heart Cath and Coronary Angiography;  Surgeon: Lyn RecordsSmith, Henry W, MD;  [facility];  Service: Cardiovascular;  Laterality: N/A;  . LUMBAR LAMINECTOMY/DECOMPRESSION MICRODISCECTOMY  11/09/2012   "L3-4" (11/09/2012)  . LUMBAR  LAMINECTOMY/DECOMPRESSION MICRODISCECTOMY N/A 11/09/2012   Procedure: L3-L4 DECOMPRESSION AND MICRODISCECTOMY   (1 LEVEL);  Surgeon: Venita Lickahari Brooks, MD;  [facility];  Service: Orthopedics;  Laterality: N/A;    OB History    No data available       Home Medications    Prior to Admission medications   Medication Sig Start Date End Date Taking? Authorizing Provider  aspirin EC 81 MG tablet Take 81 mg by mouth daily.   Yes [provider]  fluticasone (FLONASE) 50 MCG/ACT nasal spray Place 1 spray into both nostrils daily as needed for allergies or rhinitis. 12/22/16  Yes Loren RacerYelverton, David, MD  ibuprofen (ADVIL,MOTRIN) 600 MG tablet Take 1 tablet (600 mg total) by mouth every 6 (six) hours as needed. 12/22/16  Yes Loren RacerYelverton, David, MD  levocetirizine (XYZAL) 5 MG tablet Take 1 tablet (5 mg total) by mouth daily as needed for allergies. 12/22/16  Yes Loren RacerYelverton, David, MD  methocarbamol (ROBAXIN) 500 MG tablet Take 2 tablets (1,000 mg total) by mouth every 8 (eight) hours as needed for muscle spasms. 12/22/16  Yes Loren RacerYelverton, David, MD  olmesartan-hydrochlorothiazide (BENICAR HCT) 40-25 MG tablet Take 1 tablet by mouth daily. 07/23/16  Yes Chilton Siandolph, Tiffany, MD  oxyCODONE-acetaminophen (PERCOCET/ROXICET) 5-325 MG tablet Take 1 tablet by  mouth every 4 (four) hours as needed. 11/19/16  Yes [provider]  ondansetron (ZOFRAN ODT) 8 MG disintegrating tablet Take 1 tablet (8 mg total) by mouth every 8 (eight) hours as needed for nausea or vomiting. 12/25/16   Linwood Dibbles, MD    Family History Family History  Problem Relation Age of Onset  . Heart disease Mother   . CAD Mother   . Stroke Mother   . Heart disease Sister   . Liver disease Brother   . Colon cancer Neg Hx   . Esophageal cancer Neg Hx   . Rectal cancer Neg Hx   . Stomach cancer Neg Hx     Social History Social History   Tobacco Use  . Smoking status: Never Smoker  . Smokeless tobacco: Never Used    Substance Use Topics  . Alcohol use: Yes    Alcohol/week: 4.2 oz    Types: 7 Glasses of wine per week    Comment: 11/09/2012 "glass of wine q night"  . Drug use: No     Allergies   Patient has no known allergies.   Review of Systems Review of Systems  All other systems reviewed and are negative.    Physical Exam Updated Vital Signs BP (!) 180/109 (BP Location: Right Arm)   Pulse 91   Temp 98.3 F (36.8 C) (Oral)   Resp 17   Ht 1.651 m (5\' 5" )   Wt 88.5 kg (195 lb)   LMP 06/03/2011   SpO2 98%   BMI 32.45 kg/m   Physical Exam  Constitutional: She appears well-developed and well-nourished. No distress.  HENT:  Head: Normocephalic and atraumatic.  Right Ear: External ear normal.  Left Ear: External ear normal.  Eyes: Conjunctivae are normal. Right eye exhibits no discharge. Left eye exhibits no discharge. No scleral icterus.  Neck: Neck supple. No tracheal deviation present.  Cardiovascular: Normal rate, regular rhythm and intact distal pulses.  Pulmonary/Chest: Effort normal and breath sounds normal. No stridor. No respiratory distress. She has no wheezes. She has no rales.  Abdominal: Soft. Bowel sounds are normal. She exhibits no distension. There is no tenderness. There is no rebound and no guarding.  Musculoskeletal: She exhibits no edema or tenderness.  Neurological: She is alert. She has normal strength. No cranial nerve deficit (no facial droop, extraocular movements intact, no slurred speech) or sensory deficit. She exhibits normal muscle tone. She displays no seizure activity. Coordination normal.  Skin: Skin is warm and dry. No rash noted.  Psychiatric: She has a normal mood and affect.  Nursing note and vitals reviewed.    ED Treatments / Results  Labs (all labs ordered are listed, but only abnormal results are displayed) Labs Reviewed  WET PREP, GENITAL - Abnormal; Notable for the following components:      Result Value   Yeast Wet Prep HPF POC  PRESENT (*)    WBC, Wet Prep HPF POC FEW (*)    All other components within normal limits  COMPREHENSIVE METABOLIC PANEL - Abnormal; Notable for the following components:   Glucose, Bld 111 (*)    All other components within normal limits  URINALYSIS, ROUTINE W REFLEX MICROSCOPIC - Abnormal; Notable for the following components:   Leukocytes, UA TRACE (*)    Squamous Epithelial / LPF 0-5 (*)    All other components within normal limits  LIPASE, BLOOD  CBC  RPR  HIV ANTIBODY (ROUTINE TESTING)  TYPE AND SCREEN  ABO/RH  GC/CHLAMYDIA PROBE AMP (  [place]) NOT AT [facility]     Radiology No results found.  Procedures Procedures (including critical care time)  Medications Ordered in ED Medications - No data to display   Initial Impression / Assessment and Plan / ED Course  I have reviewed the triage vital signs and the nursing notes.  Pertinent labs & imaging results that were available during my care of the patient were reviewed by me and considered in my medical decision making (see chart for details).   Patient presents to the emergency room for evaluation of vaginal bleeding.  She also fell recently a few days and hit her head.  Patient was seen in.  Patient is postmenopausal.  She was concerned about the bleeding.  Patient was also wondering she may have injured herself.  Patient's pelvic exam is unremarkable.  No evidence of any injury.  She did have a small amount of friability of the cervix when doing the cervical swabs.  I recommend she follow-up with her OB/GYN doctor.  We did discuss the significance of post menopausal bleeding and the need to make sure that she gets evaluated for the possibility of endometrial cancer.  Patient is having some nausea after recent head injury.  She did have a scan previously.  This is consistent with post concussion syndrome.  It is hypertensive.  Patient is on oral antihypertensive medications.  Plan on outpatient follow-up. Final Clinical  Impressions(s) / ED Diagnoses   Final diagnoses:  Vaginal bleeding  Post-concussion headache  Essential hypertension    ED Discharge Orders        Ordered    ondansetron (ZOFRAN ODT) 8 MG disintegrating tablet  Every 8 hours PRN     12/25/16 1632       Linwood DibblesKnapp, Amal Renbarger, MD 12/25/16 1636

## 2016-12-25 NOTE — ED Triage Notes (Signed)
Onset 12-22-16 pt fell on ice, hit back of head, [facility] PCP yesterday dx: with concussion.  Vomited several times since fall.  Onset 2 days ago LLQ abd pain.  Onset this morning pt had dark red blood on underwear and when wiping.

## 2016-12-26 LAB — HIV ANTIBODY (ROUTINE TESTING W REFLEX): HIV Screen 4th Generation wRfx: NONREACTIVE

## 2016-12-26 LAB — RPR: RPR Ser Ql: NONREACTIVE

## 2016-12-27 LAB — GC/CHLAMYDIA PROBE AMP (~~LOC~~) NOT AT ARMC
Chlamydia: NEGATIVE
Neisseria Gonorrhea: NEGATIVE

## 2017-01-28 ENCOUNTER — Encounter: Payer: Self-pay | Admitting: Neurology

## 2017-04-07 DIAGNOSIS — G8929 Other chronic pain: Secondary | ICD-10-CM | POA: Insufficient documentation

## 2017-04-22 DIAGNOSIS — S161XXA Strain of muscle, fascia and tendon at neck level, initial encounter: Secondary | ICD-10-CM | POA: Insufficient documentation

## 2017-05-12 DIAGNOSIS — M5416 Radiculopathy, lumbar region: Secondary | ICD-10-CM | POA: Insufficient documentation

## 2017-05-16 ENCOUNTER — Encounter

## 2017-05-16 ENCOUNTER — Ambulatory Visit (INDEPENDENT_AMBULATORY_CARE_PROVIDER_SITE_OTHER): Payer: 59 | Admitting: Neurology

## 2017-05-16 ENCOUNTER — Encounter: Payer: Self-pay | Admitting: Neurology

## 2017-05-16 VITALS — BP 104/84 | HR 88 | Ht 61.0 in | Wt 194.0 lb

## 2017-05-16 DIAGNOSIS — M542 Cervicalgia: Secondary | ICD-10-CM

## 2017-05-16 DIAGNOSIS — G44329 Chronic post-traumatic headache, not intractable: Secondary | ICD-10-CM | POA: Diagnosis not present

## 2017-05-16 MED ORDER — GABAPENTIN 300 MG PO CAPS: 300.0000 mg | ORAL_CAPSULE | Freq: Every day | ORAL | 2 refills | Status: DC

## 2017-05-16 NOTE — Progress Notes (Signed)
NEUROLOGY CONSULTATION NOTE  KEELIE ZEMANEK MRN: [id number] DOB: 1960/08/16  Referring provider: Arnette Felts, FNP Primary care provider: Dorothyann Peng, MD  Reason for consult:  Postconcussion headache  HISTORY OF PRESENT ILLNESS: [name] is a 57 year old right-handed female with hypertension and chronic low back pain who presents for postconcussion headache.  History supplemented by referring provider's note.  CT head and cervical spine imaging personally reviewed.  Onset:  In December 2018, she hit the back of her head after slipping on black ice.  She did not lose consciousness but developed headache and neck pain.  She was evaluated in the ED.  CT of head demonstrated right parietal scalp hematoma but no skull fracture or intracranial abnormality.  CT cervical spine showed arthritic changes and possible old avulstion of the C7 spinous process but no acute fractures.   She followed up with orthopedics and pain management.  She had a cervical MRI which reportedly showed a "pinched nerve". Location:  Bifrontal and left occipital region radiating down left side of neck into shoulder. Quality:  Non-throbbing pressure Intensity:  Moderate to severe.  She denies new headache, thunderclap headache or severe headache that wakes her from sleep. Aura:  no Prodrome:  no Postdrome:  no Associated symptoms:  Initially nausea, vomiting.  Now with vision unfocused, dizziness, diaphoresis, photophobia.  She denies associated unilateral numbness or weakness. Duration:  1 hour to all day Frequency:  3 to 4 days a week Frequency of abortive medication: naproxen every morning.   Triggers/exacerbating factors:  Driving, stress, singing in choir Relieving factors:  Laying down with eyes closed to rest Activity:  aggravates  Current NSAIDS:  Naproxen, ibuprofen Current analgesics:  Oxycodone (rarely uses) Current triptans:  no Current anti-emetic:  Zofran ODT  Current muscle relaxants:   Methocarbamol  Current anti-anxiolytic:  no Current sleep aide:  no Current Antihypertensive medications:  Benicar Current Antidepressant medications:  no Current Anticonvulsant medications:  no Current Vitamins/Herbal/Supplements:  no Current Antihistamines/Decongestants:  no Other therapy:  She is followed by pain medicine.  Will be starting some form of physical therapy  Past NSAIDS:  no Past analgesics:  no Past abortive triptans:  no Past muscle relaxants:  cyclobenzaprine Past anti-emetic:  no Past antihypertensive medications:  no Past antidepressant medications:  no Past anticonvulsant medications:  no Past vitamins/Herbal/Supplements:  no Past antihistamines/decongestants:  no Other past therapies:  no  Caffeine:  no Alcohol:  occasional Smoker:  no Diet:  hydrates Depression:  no; Anxiety:  no Other pain:  Back pain Sleep hygiene:  poor Remote history of headaches over 20 years ago.  PAST MEDICAL HISTORY: Past Medical History:  Diagnosis Date  . Allergy   . Arthritis    back   . Atypical chest pain 06/30/2016  . Back pain    DUE TO INJURY AT WORK ON05/2013  . Bruises easily   . Chest pain 06/30/2016  . Chronic lower back pain   . Depression    was on meds 2 yrs ago   . Essential hypertension 06/30/2016  . Headache(784.0)    rarely  . History of bronchitis    last time about 57yrs ago   . Hypertension    takes Benicar daily  . Hypothyroidism 06/30/2016  . Vitamin D deficiency    takes Vitamin D 2 times/wk  . Weakness    and numbness right foot    PAST SURGICAL HISTORY: Past Surgical History:  Procedure Laterality Date  . BACK SURGERY    .  CARPAL TUNNEL RELEASE Right   . CESAREAN SECTION  1988  . COLONOSCOPY    . EXPLORATORY LAPAROTOMY  1991   "took out fatty tumor" (11/09/2012)  . LEFT HEART CATH AND CORONARY ANGIOGRAPHY N/A 08/06/2016   Procedure: Left Heart Cath and Coronary Angiography;  Surgeon: Lyn Records, MD;  Location: [facility]  INVASIVE CV LAB;  Service: Cardiovascular;  Laterality: N/A;  . LUMBAR LAMINECTOMY/DECOMPRESSION MICRODISCECTOMY  11/09/2012   "L3-4" (11/09/2012)  . LUMBAR LAMINECTOMY/DECOMPRESSION MICRODISCECTOMY N/A 11/09/2012   Procedure: L3-L4 DECOMPRESSION AND MICRODISCECTOMY   (1 LEVEL);  Surgeon: Venita Lick, MD;  Location: [facility] OR;  Service: Orthopedics;  Laterality: N/A;    MEDICATIONS: Current Outpatient Medications on File Prior to Visit  Medication Sig Dispense Refill  . aspirin EC 81 MG tablet Take 81 mg by mouth daily.    Marland Kitchen BELVIQ XR 20 MG TB24 Take 1 tablet by mouth daily.  1  . fluticasone (FLONASE) 50 MCG/ACT nasal spray Place 1 spray into both nostrils daily as needed for allergies or rhinitis. 16 g 0  . ibuprofen (ADVIL,MOTRIN) 600 MG tablet Take 1 tablet (600 mg total) by mouth every 6 (six) hours as needed. 30 tablet 0  . levocetirizine (XYZAL) 5 MG tablet Take 1 tablet (5 mg total) by mouth daily as needed for allergies. 30 tablet 0  . methocarbamol (ROBAXIN) 500 MG tablet Take 2 tablets (1,000 mg total) by mouth every 8 (eight) hours as needed for muscle spasms. 30 tablet 0  . olmesartan-hydrochlorothiazide (BENICAR HCT) 40-25 MG tablet Take 1 tablet by mouth daily. 901 tablet 1  . ondansetron (ZOFRAN ODT) 8 MG disintegrating tablet Take 1 tablet (8 mg total) by mouth every 8 (eight) hours as needed for nausea or vomiting. 20 tablet 0  . oxyCODONE-acetaminophen (PERCOCET/ROXICET) 5-325 MG tablet Take 1 tablet by mouth every 4 (four) hours as needed.  0   No current facility-administered medications on file prior to visit.     ALLERGIES: No Known Allergies  FAMILY HISTORY: Family History  Problem Relation Age of Onset  . Heart disease Mother   . CAD Mother   . Stroke Mother   . Heart disease Sister   . Liver disease Brother   . Colon cancer Neg Hx   . Esophageal cancer Neg Hx   . Rectal cancer Neg Hx   . Stomach cancer Neg Hx     SOCIAL HISTORY: Social History    Socioeconomic History  . Marital status: Divorced    Spouse name: Not on file  . Number of children: 1  . Years of education: Not on file  . Highest education level: Some college, no degree  Occupational History  . Not on file  Social Needs  . Financial resource strain: Not on file  . Food insecurity:    Worry: Not on file    Inability: Not on file  . Transportation needs:    Medical: Not on file    Non-medical: Not on file  Tobacco Use  . Smoking status: Never Smoker  . Smokeless tobacco: Never Used  Substance and Sexual Activity  . Alcohol use: Yes    Alcohol/week: 4.2 oz    Types: 7 Glasses of wine per week    Comment: 11/09/2012 "glass of wine q night"  . Drug use: No  . Sexual activity: Yes    Birth control/protection: Post-menopausal  Lifestyle  . Physical activity:    Days per week: Not on file    Minutes per session:  Not on file  . Stress: Not on file  Relationships  . Social connections:    Talks on phone: Not on file    Gets together: Not on file    Attends religious service: Not on file    Active member of club or organization: Not on file    Attends meetings of clubs or organizations: Not on file    Relationship status: Not on file  . Intimate partner violence:    Fear of current or ex partner: Not on file    Emotionally abused: Not on file    Physically abused: Not on file    Forced sexual activity: Not on file  Other Topics Concern  . Not on file  Social History Narrative   Pt is right handed. She is divorced, lives with her son and grandson. She occasionally drinks caffeine, no regular exercise. 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    REVIEW OF SYSTEMS: Constitutional: No fevers, chills, or sweats, no generalized fatigue, change in appetite Eyes: No visual changes, double vision, eye pain Ear, nose and throat: No hearing loss, ear pain, nasal congestion, sore throat Cardiovascular: No chest pain,  palpitations Respiratory:  No shortness of breath at rest or with exertion, wheezes GastrointestinaI: No nausea, vomiting, diarrhea, abdominal pain, fecal incontinence Genitourinary:  No dysuria, urinary retention or frequency Musculoskeletal:  No neck pain, back pain Integumentary: No rash, pruritus, skin lesions Neurological: as above Psychiatric: No depression, insomnia, anxiety Endocrine: No palpitations, fatigue, diaphoresis, mood swings, change in appetite, change in weight, increased thirst Hematologic/Lymphatic:  No purpura, petechiae. Allergic/Immunologic: no itchy/runny eyes, nasal congestion, recent allergic reactions, rashes  PHYSICAL EXAM: Vitals:   05/16/17 1001  BP: 104/84  Pulse: 88  SpO2: 98%   General: No acute distress.  Patient appears well-groomed.  Head:  Normocephalic/atraumatic Eyes:  fundi examined but not visualized Neck: supple, left suboccipital and paraspinal tenderness, full range of motion Back: No paraspinal tenderness Heart: regular rate and rhythm Lungs: Clear to auscultation bilaterally. Vascular: No carotid bruits. Neurological Exam: Mental status: alert and oriented to person, place, and time, recent and remote memory intact, fund of knowledge intact, attention and concentration intact, speech fluent and not dysarthric, language intact. Cranial nerves: CN I: not tested CN II: pupils equal, round and reactive to light, visual fields intact CN III, IV, VI:  full range of motion, no nystagmus, no ptosis CN V: facial sensation intact CN VII: upper and lower face symmetric CN VIII: hearing intact CN IX, X: gag intact, uvula midline CN XI: sternocleidomastoid and trapezius muscles intact CN XII: tongue midline Bulk & Tone: normal, no fasciculations. Motor:  5/5 throughout  Sensation: temperature and vibration sensation intact. Deep Tendon Reflexes:  2+ throughout, toes downgoing.  Finger to nose testing:  Without dysmetria.  Heel to shin:   Without dysmetria.  Gait:  Normal station and stride.  Able to turn and tandem walk. Romberg negative.  IMPRESSION: Postconcussion headache/tension type headache with cervicogenic component  PLAN: 1.  Start gabapentin  at bedtime.  If headache not better in 1 month, she is to contact me and we can increase dose 2.   Limit pain relievers to no more than 2 days out of week to prevent rebound headache 3.  Use methocarbamol 4.  Follow up in 3 months.  Thank you for allowing me to take part in the care of this patient.  Shon Millet, DO  CC:  Dorothyann Peng, MD  Arnette Felts, FNP

## 2017-05-16 NOTE — Patient Instructions (Signed)
1.  Start gabapentin  at bedtime.  If headache not better in 1 month, contact me and we can increase dose 2.   Limit pain relievers to no more than 2 days out of week to prevent rebound headache 3.  Use muscle relaxer as needed 4.  Follow up in 3 months.

## 2017-06-17 ENCOUNTER — Other Ambulatory Visit: Payer: Self-pay | Admitting: Orthopedic Surgery

## 2017-06-17 DIAGNOSIS — M5416 Radiculopathy, lumbar region: Secondary | ICD-10-CM

## 2017-07-06 ENCOUNTER — Inpatient Hospital Stay: Admission: RE | Admit: 2017-07-06 | Payer: Self-pay | Source: Ambulatory Visit

## 2017-07-07 ENCOUNTER — Ambulatory Visit
Admission: RE | Admit: 2017-07-07 | Discharge: 2017-07-07 | Disposition: A | Discharge status: Home / Self Care | Payer: 59 | Source: Ambulatory Visit | Attending: Orthopedic Surgery | Admitting: Orthopedic Surgery

## 2017-07-07 ENCOUNTER — Ambulatory Visit
Admission: RE | Admit: 2017-07-07 | Discharge: 2017-07-07 | Disposition: A | Discharge status: Home / Self Care | Payer: Self-pay | Source: Ambulatory Visit | Attending: Orthopedic Surgery | Admitting: Orthopedic Surgery

## 2017-07-07 ENCOUNTER — Other Ambulatory Visit: Payer: Self-pay | Admitting: Orthopedic Surgery

## 2017-07-07 DIAGNOSIS — M5416 Radiculopathy, lumbar region: Secondary | ICD-10-CM

## 2017-07-07 IMAGING — XA DG FLUORO GUIDE NDL PLC/BX
2 series · 2 of 2 positions shown · non-contrast
Comparison: none

CLINICAL DATA: Lumbar radiculopathy. Broad-based disc protrusion at
L3-4.

EXAM:
Intradiscal injection at L3-4
TECHNIQUE: Discussed the risks and benefits procedure including achieving
additional diagnostic information and relief of pain. Risks include
infection or bleeding. There is a risk of higher incidence of disc
disease in the future following penetration of the disc.

[Series 1: ortho adipose · 1 of 1 slices shown (1 of 2)]
[im 1/1]
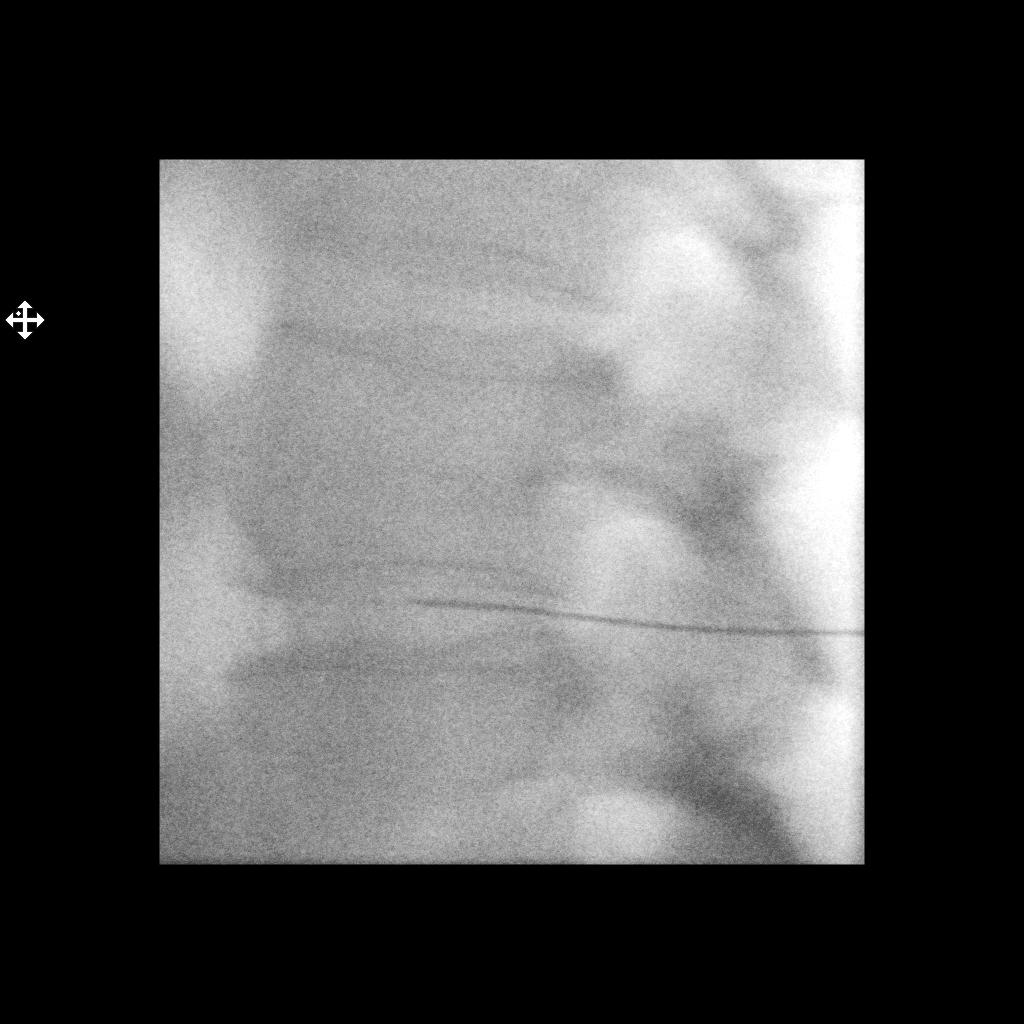

[Series 2: ortho adipose · 1 of 1 slices shown (2 of 2)]
[im 1/1]
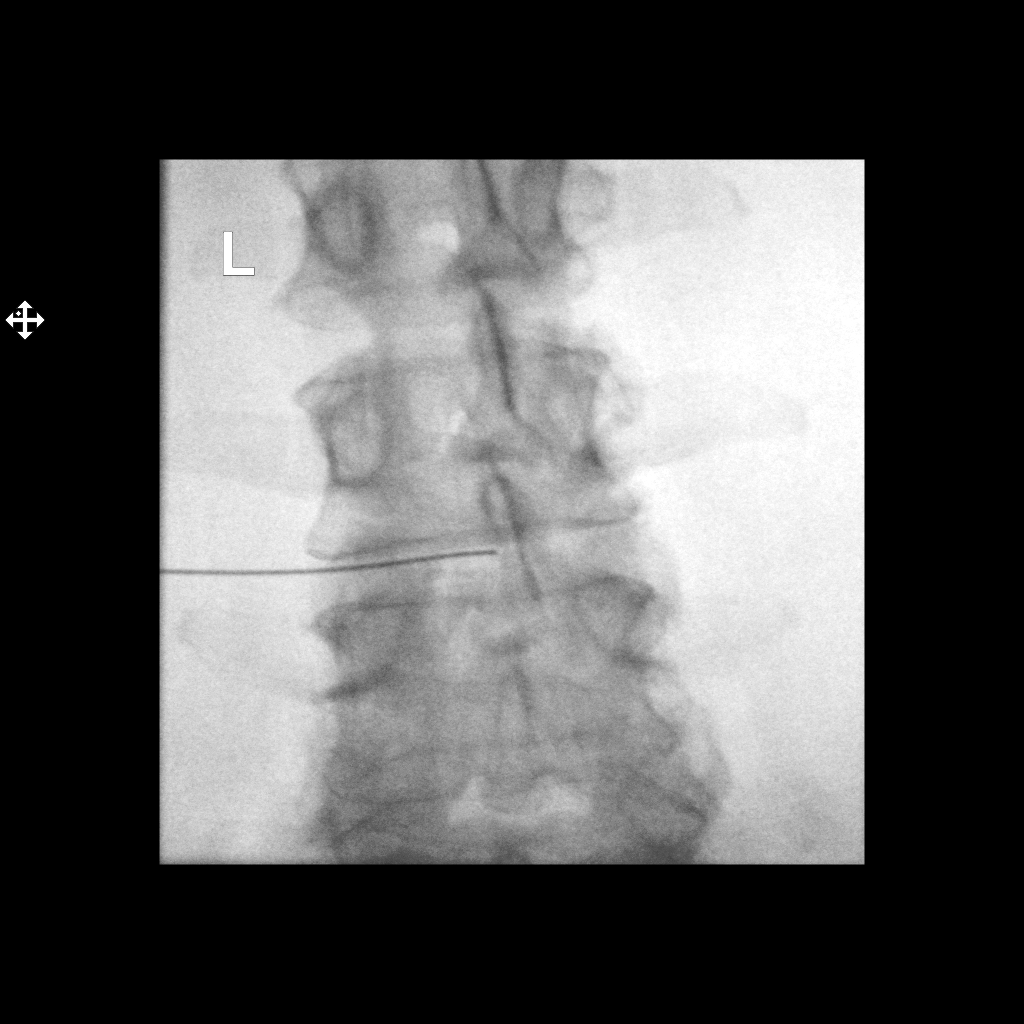

[2 of 2 positions shown; findings below may reference images not displayed]

2 g Ancef were given IV prior to the procedure for antibiotic
prophylaxis.

Lumbar region prepped with Betadine, draped in usual sterile
fashion, and infiltrated locally with 1% lidocaine.

The superficial tissues were not studies with 1% lidocaine. A curved
22 gauge Chiba needle was advanced into the L3-4 interspaces using a
left posterolateral approach. The needle was advanced into the
central portion of the disc. I then injected 30 mg of Kenalog and 2
mL 1% lidocaine.
FINDINGS: The patient reported pressure in her back extending into the groin
bilaterally. This slowly resolved in recovery. This does not
replicate her typical pain.
IMPRESSION: Technically successful intradiscal injection of 30 mg Kenalog and 2
mL 1% lidocaine at L3-4 via a left posterolateral approach.

## 2017-07-07 MED ORDER — TRIAMCINOLONE ACETONIDE 40 MG/ML IJ SUSP (RADIOLOGY)
30.0000 mg | Freq: Once | INTRAMUSCULAR | Status: AC
  Administered 2017-07-07: 30 mg

## 2017-07-07 MED ORDER — CEFAZOLIN SODIUM-DEXTROSE 2-4 GM/100ML-% IV SOLN
2.0000 g | Freq: Once | INTRAVENOUS | Status: AC
  Administered 2017-07-07: 2 g via INTRAVENOUS

## 2017-07-07 NOTE — Discharge Instructions (Signed)

## 2017-08-29 NOTE — Progress Notes (Deleted)
NEUROLOGY FOLLOW UP OFFICE NOTE  [name] [id number]  HISTORY OF PRESENT ILLNESS: [name] is a 57 year old right-handed female with hypertension and chronic low back pain who follows up for postconcussion headache.  UPDATE: Intensity:  *** Duration:  *** Frequency:  *** Frequency of abortive medication: *** Current NSAIDS:  Naproxen, ibuprofen Current analgesics:  Oxycodone (rarely uses) Current triptans:  no Current ergotamine:  no Current anti-emetic:  Zofran ODT 8mg  Current muscle relaxants:  Methocarbamol 1000mg  Current anti-anxiolytic:  no Current sleep aide:  no Current Antihypertensive medications:  Benicar Current Antidepressant medications:  no Current Anticonvulsant medications:  Gabapentin 300mg  at bedtime Current anti-CGRP:  no Current Vitamins/Herbal/Supplements:  no Current Antihistamines/Decongestants:  no Other therapy:  no  Caffeine:  no Alcohol:  occasional Smoker:  no Diet:  hydrates Exercise:  *** Depression:  no; Anxiety:  no Other pain:  Back pain Sleep hygiene:  Poor  HISTORY: Onset:  In December 2018, she hit the back of her head after slipping on black ice.  She did not lose consciousness but developed headache and neck pain.  She was evaluated in the ED.  CT of head demonstrated right parietal scalp hematoma but no skull fracture or intracranial abnormality.  CT cervical spine showed arthritic changes and possible old avulstion of the C7 spinous process but no acute fractures.   She followed up with orthopedics and pain management.  She had a cervical MRI which reportedly showed a "pinched nerve". Location:  Bifrontal and left occipital region radiating down left side of neck into shoulder. Quality:  Non-throbbing pressure Initial intensity:  Moderate to severe.  She denies new headache, thunderclap headache or severe headache that wakes her from sleep. Aura:  no Prodrome:  no Postdrome:  no Associated symptoms:  Initially  nausea, vomiting.  Now with vision unfocused, dizziness, diaphoresis, photophobia.  She denies associated unilateral numbness or weakness. Initial Duration:  1 hour to all day Initial Frequency:  3 to 4 days a week Initial Frequency of abortive medication: naproxen every morning.   Aggravating factors:  Driving, singing in choir Relieving factors:  Laying down with eyes closed to rest. Activity:  aggravates  Past NSAIDS:  no Past analgesics:  no Past abortive triptans:  no Past muscle relaxants:  cyclobenzaprine Past anti-emetic:  no Past antihypertensive medications:  no Past antidepressant medications:  no Past anticonvulsant medications:  no Past vitamins/Herbal/Supplements:  no Past antihistamines/decongestants:  no Other past therapies:  no  Remote history of headaches in her 30s.  PAST MEDICAL HISTORY: Past Medical History:  Diagnosis Date  . Allergy   . Arthritis    back   . Atypical chest pain 06/30/2016  . Back pain    DUE TO INJURY AT WORK ON05/2013  . Bruises easily   . Chest pain 06/30/2016  . Chronic lower back pain   . Depression    was on meds 2 yrs ago   . Essential hypertension 06/30/2016  . Headache(784.0)    rarely  . History of bronchitis    last time about 51yrs ago   . Hypertension    takes Benicar daily  . Hypothyroidism 06/30/2016  . Vitamin D deficiency    takes Vitamin D 2 times/wk  . Weakness    and numbness right foot    MEDICATIONS: Current Outpatient Medications on File Prior to Visit  Medication Sig Dispense Refill  . aspirin EC 81 MG tablet Take 81 mg by mouth daily.    Marland Kitchen  BELVIQ XR 20 MG TB24 Take 1 tablet by mouth daily.  1  . fluticasone (FLONASE) 50 MCG/ACT nasal spray Place 1 spray into both nostrils daily as needed for allergies or rhinitis. 16 g 0  . gabapentin (NEURONTIN) 300 MG capsule Take 1 capsule (300 mg total) by mouth at bedtime. 30 capsule 2  . ibuprofen (ADVIL,MOTRIN) 600 MG tablet Take 1 tablet (600 mg total) by  mouth every 6 (six) hours as needed. 30 tablet 0  . levocetirizine (XYZAL) 5 MG tablet Take 1 tablet (5 mg total) by mouth daily as needed for allergies. 30 tablet 0  . methocarbamol (ROBAXIN) 500 MG tablet Take 2 tablets (1,000 mg total) by mouth every 8 (eight) hours as needed for muscle spasms. 30 tablet 0  . olmesartan-hydrochlorothiazide (BENICAR HCT) 40-25 MG tablet Take 1 tablet by mouth daily. 901 tablet 1  . ondansetron (ZOFRAN ODT) 8 MG disintegrating tablet Take 1 tablet (8 mg total) by mouth every 8 (eight) hours as needed for nausea or vomiting. 20 tablet 0  . oxyCODONE-acetaminophen (PERCOCET/ROXICET) 5-325 MG tablet Take 1 tablet by mouth every 4 (four) hours as needed.  0   No current facility-administered medications on file prior to visit.     ALLERGIES: No Known Allergies  FAMILY HISTORY: Family History  Problem Relation Age of Onset  . Heart disease Mother   . CAD Mother   . Stroke Mother   . Heart disease Sister   . Liver disease Brother   . Colon cancer Neg Hx   . Esophageal cancer Neg Hx   . Rectal cancer Neg Hx   . Stomach cancer Neg Hx     SOCIAL HISTORY: Social History   Socioeconomic History  . Marital status: Divorced    Spouse name: Not on file  . Number of children: 1  . Years of education: Not on file  . Highest education level: Some college, no degree  Occupational History  . Not on file  Social Needs  . Financial resource strain: Not on file  . Food insecurity:    Worry: Not on file    Inability: Not on file  . Transportation needs:    Medical: Not on file    Non-medical: Not on file  Tobacco Use  . Smoking status: Never Smoker  . Smokeless tobacco: Never Used  Substance and Sexual Activity  . Alcohol use: Yes    Alcohol/week: 7.0 standard drinks    Types: 7 Glasses of wine per week    Comment: 11/09/2012 "glass of wine q night"  . Drug use: No  . Sexual activity: Yes    Birth control/protection: Post-menopausal  Lifestyle  .  Physical activity:    Days per week: Not on file    Minutes per session: Not on file  . Stress: Not on file  Relationships  . Social connections:    Talks on phone: Not on file    Gets together: Not on file    Attends religious service: Not on file    Active member of club or organization: Not on file    Attends meetings of clubs or organizations: Not on file    Relationship status: Not on file  . Intimate partner violence:    Fear of current or ex partner: Not on file    Emotionally abused: Not on file    Physically abused: Not on file    Forced sexual activity: Not on file  Other Topics Concern  . Not on  file  Social History Narrative   Pt is right handed. She is divorced, lives with her son and grandson. She occasionally drinks caffeine, no regular exercise. 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    REVIEW OF SYSTEMS: Constitutional: No fevers, chills, or sweats, no generalized fatigue, change in appetite Eyes: No visual changes, double vision, eye pain Ear, nose and throat: No hearing loss, ear pain, nasal congestion, sore throat Cardiovascular: No chest pain, palpitations Respiratory:  No shortness of breath at rest or with exertion, wheezes GastrointestinaI: No nausea, vomiting, diarrhea, abdominal pain, fecal incontinence Genitourinary:  No dysuria, urinary retention or frequency Musculoskeletal:  No neck pain, back pain Integumentary: No rash, pruritus, skin lesions Neurological: as above Psychiatric: No depression, insomnia, anxiety Endocrine: No palpitations, fatigue, diaphoresis, mood swings, change in appetite, change in weight, increased thirst Hematologic/Lymphatic:  No purpura, petechiae. Allergic/Immunologic: no itchy/runny eyes, nasal congestion, recent allergic reactions, rashes  PHYSICAL EXAM: *** General: No acute distress.  Patient appears ***-groomed.  *** body habitus. Head:  Normocephalic/atraumatic Eyes:  Fundi  examined but not visualized Neck: supple, no paraspinal tenderness, full range of motion Heart:  Regular rate and rhythm Lungs:  Clear to auscultation bilaterally Back: No paraspinal tenderness Neurological Exam: alert and oriented to person, place, and time. Attention span and concentration intact, recent and remote memory intact, fund of knowledge intact.  Speech fluent and not dysarthric, language intact.  CN II-XII intact. Bulk and tone normal, muscle strength 5/5 throughout.  Sensation to light touch, temperature and vibration intact.  Deep tendon reflexes 2+ throughout, toes downgoing.  Finger to nose and heel to shin testing intact.  Gait normal, Romberg negative.  IMPRESSION: Postconcussion headache/tension-type headache with cervicogenic component  PLAN: ***  Shon MilletAdam Alyas Creary, DO  CC:  Dorothyann Pengobyn Sanders, MD  Arnette FeltsJanece Moore, FNP

## 2017-08-30 ENCOUNTER — Ambulatory Visit: Payer: Self-pay | Admitting: Neurology

## 2017-10-19 ENCOUNTER — Ambulatory Visit: Payer: Self-pay | Admitting: Neurology

## 2017-10-20 ENCOUNTER — Ambulatory Visit: Payer: 59 | Admitting: Internal Medicine

## 2017-10-20 ENCOUNTER — Ambulatory Visit: Payer: 59 | Admitting: Nurse Practitioner

## 2017-10-22 ENCOUNTER — Other Ambulatory Visit: Payer: Self-pay | Admitting: Neurology

## 2017-11-08 ENCOUNTER — Other Ambulatory Visit: Payer: Self-pay | Admitting: Neurology

## 2017-12-11 ENCOUNTER — Other Ambulatory Visit: Payer: Self-pay | Admitting: Nurse Practitioner

## 2018-03-09 ENCOUNTER — Ambulatory Visit: Payer: 59 | Admitting: Internal Medicine

## 2018-03-14 ENCOUNTER — Encounter: Payer: Self-pay | Admitting: Internal Medicine

## 2018-03-14 ENCOUNTER — Ambulatory Visit: Payer: BLUE CROSS/BLUE SHIELD | Admitting: Internal Medicine

## 2018-03-14 VITALS — BP 126/76 | HR 79 | Temp 98.4°F | Ht 61.0 in | Wt 196.8 lb

## 2018-03-14 DIAGNOSIS — Z6837 Body mass index (BMI) 37.0-37.9, adult: Secondary | ICD-10-CM

## 2018-03-14 DIAGNOSIS — I10 Essential (primary) hypertension: Secondary | ICD-10-CM

## 2018-03-14 DIAGNOSIS — R7309 Other abnormal glucose: Secondary | ICD-10-CM

## 2018-03-14 DIAGNOSIS — M5416 Radiculopathy, lumbar region: Secondary | ICD-10-CM

## 2018-03-14 DIAGNOSIS — R5383 Other fatigue: Secondary | ICD-10-CM | POA: Diagnosis not present

## 2018-03-14 DIAGNOSIS — M25552 Pain in left hip: Secondary | ICD-10-CM

## 2018-03-14 MED ORDER — CYCLOBENZAPRINE HCL 10 MG PO TABS: 10.0000 mg | ORAL_TABLET | Freq: Every day | ORAL | 0 refills | Status: DC | PRN

## 2018-03-14 MED ORDER — KETOROLAC TROMETHAMINE 60 MG/2ML IM SOLN
60.0000 mg | Freq: Once | INTRAMUSCULAR | Status: AC
  Administered 2018-03-14: 60 mg via INTRAMUSCULAR

## 2018-03-14 NOTE — Patient Instructions (Signed)

## 2018-03-15 LAB — CMP14+EGFR
ALT: 21 IU/L (ref 0–32)
AST: 20 IU/L (ref 0–40)
Albumin/Globulin Ratio: 1.4 (ref 1.2–2.2)
Albumin: 4.6 g/dL (ref 3.8–4.9)
Alkaline Phosphatase: 78 IU/L (ref 39–117)
BUN/Creatinine Ratio: 18 (ref 9–23)
BUN: 15 mg/dL (ref 6–24)
Bilirubin Total: 0.6 mg/dL (ref 0.0–1.2)
CO2: 24 mmol/L (ref 20–29)
Calcium: 10.1 mg/dL (ref 8.7–10.2)
Chloride: 99 mmol/L (ref 96–106)
Creatinine, Ser: 0.82 mg/dL (ref 0.57–1.00)
GFR calc Af Amer: 91 mL/min/{1.73_m2} (ref >59)
GFR calc non Af Amer: 79 mL/min/{1.73_m2} (ref >59)
Globulin, Total: 3.2 g/dL (ref 1.5–4.5)
Glucose: 97 mg/dL (ref 65–99)
Potassium: 4.2 mmol/L (ref 3.5–5.2)
Sodium: 141 mmol/L (ref 134–144)
Total Protein: 7.8 g/dL (ref 6.0–8.5)

## 2018-03-15 LAB — HEMOGLOBIN A1C
Est. average glucose Bld gHb Est-mCnc: 123 mg/dL
Hgb A1c MFr Bld: 5.9 % — ABNORMAL HIGH (ref 4.8–5.6)

## 2018-03-15 LAB — CBC
Hematocrit: 36.3 % (ref 34.0–46.6)
Hemoglobin: 12.3 g/dL (ref 11.1–15.9)
MCH: 29.6 pg (ref 26.6–33.0)
MCHC: 33.9 g/dL (ref 31.5–35.7)
MCV: 87 fL (ref 79–97)
Platelets: 316 10*3/uL (ref 150–450)
RBC: 4.16 x10E6/uL (ref 3.77–5.28)
RDW: 12.7 % (ref 11.7–15.4)
WBC: 7.8 10*3/uL (ref 3.4–10.8)

## 2018-03-15 LAB — TSH: TSH: 2 u[IU]/mL (ref 0.450–4.500)

## 2018-03-15 NOTE — Progress Notes (Signed)
Subjective:     Patient ID: [name] , female    DOB: October 02, 1960 , 58 y.o.   MRN: [id number]   Chief Complaint  Patient presents with  . Fatigue  . Hypertension    HPI  She is here today for f/u. She reports worsening fatigue. She denies change in her eating/sleeping habits. Denies SOB on exertion. She is now working, but not where she wants to be. She is working Therapist, art for [employer]. She is not sure what could be contributing to her symptoms.   Hypertension  This is a chronic problem. The current episode started more than 1 year ago. The problem has been gradually improving since onset. The problem is controlled. Pertinent negatives include no blurred vision, chest pain, palpitations or shortness of breath. Risk factors for coronary artery disease include obesity, post-menopausal state and sedentary lifestyle.   She reports compliance with meds.   Past Medical History:  Diagnosis Date  . Allergy   . Arthritis    back   . Atypical chest pain 06/30/2016  . Back pain    DUE TO INJURY AT WORK ON05/2013  . Bruises easily   . Chest pain 06/30/2016  . Chronic lower back pain   . Depression    was on meds 2 yrs ago   . Essential hypertension 06/30/2016  . Headache(784.0)    rarely  . History of bronchitis    last time about 48yr ago   . Hypertension    takes Benicar daily  . Hypothyroidism 06/30/2016  . Vitamin D deficiency    takes Vitamin D 2 times/wk  . Weakness    and numbness right foot     Family History  Problem Relation Age of Onset  . Heart disease Mother   . CAD Mother   . Stroke Mother   . Heart disease Sister   . Liver disease Brother   . Colon cancer Neg Hx   . Esophageal cancer Neg Hx   . Rectal cancer Neg Hx   . Stomach cancer Neg Hx      Current Outpatient Medications:  .  fluticasone (FLONASE) 50 MCG/ACT nasal spray, Place 1 spray into both nostrils daily as needed for allergies or rhinitis., Disp: 16 g, Rfl: 0 .  gabapentin  (NEURONTIN) 300 MG capsule, TAKE 1 CAPSULE BY MOUTH EVERYDAY AT BEDTIME, Disp: 30 capsule, Rfl: 0 .  levocetirizine (XYZAL) 5 MG tablet, TAKE 1 TABLET BY MOUTH EVERY DAY, Disp: 90 tablet, Rfl: 1 .  olmesartan-hydrochlorothiazide (BENICAR HCT) 40-25 MG tablet, Take 1 tablet by mouth daily., Disp: 901 tablet, Rfl: 1 .  ondansetron (ZOFRAN ODT) 8 MG disintegrating tablet, Take 1 tablet (8 mg total) by mouth every 8 (eight) hours as needed for nausea or vomiting., Disp: 20 tablet, Rfl: 0 .  cyclobenzaprine (FLEXERIL) 10 MG tablet, Take 1 tablet (10 mg total) by mouth daily as needed for muscle spasms., Disp: 30 tablet, Rfl: 0 .  ibuprofen (ADVIL,MOTRIN) 600 MG tablet, Take 1 tablet (600 mg total) by mouth every 6 (six) hours as needed. (Patient not taking: Reported on 03/14/2018), Disp: 30 tablet, Rfl: 0 .  methocarbamol (ROBAXIN) 500 MG tablet, Take 2 tablets (1,000 mg total) by mouth every 8 (eight) hours as needed for muscle spasms. (Patient not taking: Reported on 03/14/2018), Disp: 30 tablet, Rfl: 0 .  oxyCODONE-acetaminophen (PERCOCET/ROXICET) 5-325 MG tablet, Take 1 tablet by mouth every 4 (four) hours as needed., Disp: , Rfl: 0   No Known  Allergies   Review of Systems  Constitutional: Positive for fatigue.  Eyes: Negative for blurred vision.  Respiratory: Negative.  Negative for shortness of breath.   Cardiovascular: Negative.  Negative for chest pain and palpitations.  Gastrointestinal: Negative.   Musculoskeletal: Positive for arthralgias (she c/o l hip pain. ). Back pain: has h/o spinal disease. Previously followed by Dr. Rolena Infante. Has had spine inj. at [place], wants to go back. No LE weakness.   Neurological: Negative.   Psychiatric/Behavioral: Negative.      Today's Vitals   03/14/18 1505  BP: 126/76  Pulse: 79  Temp: 98.4 F (36.9 C)  TempSrc: Oral  Weight: 196 lb 12.8 oz (89.3 kg)  Height: 5' 1"  (1.549 m)  PainSc: 8   PainLoc: Leg   Body mass index is 37.19 kg/m.    Objective:  Physical Exam Vitals signs and nursing note reviewed.  Constitutional:      Appearance: Normal appearance.  HENT:     Head: Normocephalic and atraumatic.  Cardiovascular:     Rate and Rhythm: Normal rate and regular rhythm.     Heart sounds: Normal heart sounds.  Pulmonary:     Effort: Pulmonary effort is normal.     Breath sounds: Normal breath sounds.  Musculoskeletal:        General: Tenderness present.     Comments: Neg straight leg test  Left paraspinal mm tender to palpation No point tenderness  Skin:    General: Skin is warm.  Neurological:     General: No focal deficit present.     Mental Status: She is alert.  Psychiatric:        Mood and Affect: Mood normal.        Behavior: Behavior normal.         Assessment And Plan:     1. Other fatigue  I will check labs as listed below. She is encouraged to stay well hydrated and to avoid processed foods.   - CMP14+EGFR - CBC no Diff - TSH  2. Essential hypertension, benign  Well controlled. She will continue with current meds. She is encouraged to avoid adding salt to her foods. She will rto in six months for her next evaluation.  3. Other abnormal glucose  HER A1C HAS BEEN ELEVATED IN THE PAST. I WILL CHECK AN A1C, BMET TODAY. SHE WAS ENCOURAGED TO AVOID SUGARY BEVERAGES AND PROCESSED FOODS INCLUDNG BREADS, RICE AND PASTA.  - Hemoglobin A1c  4. Pain of left hip joint  Pt advised sx could also be due to lumbar spinal disease. She is encouraged to contact me in 48 hours to let me know how she is doing.   - ketorolac (TORADOL) injection 60 mg  5. Lumbar radiculopathy, acute  I will refer her to [facility] as requested for evaluation for possible intradiscal injection with steroid.   - Ambulatory referral to Interventional Radiology  6. Class 2 severe obesity due to excess calories with serious comorbidity and body mass index (BMI) of 37.0 to 37.9 in adult [facility])  Importance of achieving  optimal weight to decrease risk of cardiovascular disease and cancers was discussed with the patient in full detail. She is encouraged to start slowly - start with 10 minutes twice daily at least three to four days per week and to gradually build to 30 minutes five days weekly. She was given tips to incorporate more activity into her daily routine - take stairs when possible, park farther away from her job, grocery  stores, etc.     Maximino Greenland, MD

## 2018-03-21 ENCOUNTER — Telehealth: Payer: Self-pay

## 2018-03-21 NOTE — Telephone Encounter (Signed)
The pt called and left a message that she had her mammogram and flu vaccination today at  Dr. Redmond Baseman office.

## 2018-03-23 ENCOUNTER — Other Ambulatory Visit: Payer: Self-pay | Admitting: Internal Medicine

## 2018-03-23 DIAGNOSIS — M545 Low back pain, unspecified: Secondary | ICD-10-CM

## 2018-03-23 DIAGNOSIS — G8929 Other chronic pain: Secondary | ICD-10-CM

## 2018-04-04 ENCOUNTER — Other Ambulatory Visit: Payer: Self-pay

## 2018-04-04 ENCOUNTER — Other Ambulatory Visit: Payer: Self-pay | Admitting: Internal Medicine

## 2018-04-29 ENCOUNTER — Other Ambulatory Visit: Payer: Self-pay | Admitting: Internal Medicine

## 2018-05-29 ENCOUNTER — Telehealth: Payer: Self-pay

## 2018-05-29 NOTE — Telephone Encounter (Signed)
I returned the pt's call and notified her that Dr. Allyne Gee said that the pt would have to an EPI and not the injection then the pt would need to go back to Dr Shon Baton office.

## 2018-05-30 ENCOUNTER — Inpatient Hospital Stay: Admission: RE | Admit: 2018-05-30 | Payer: Self-pay | Source: Ambulatory Visit

## 2018-06-06 ENCOUNTER — Other Ambulatory Visit: Payer: Self-pay | Admitting: Physical Medicine and Rehabilitation

## 2018-06-06 DIAGNOSIS — M5136 Other intervertebral disc degeneration, lumbar region: Secondary | ICD-10-CM

## 2018-06-14 ENCOUNTER — Other Ambulatory Visit: Payer: Self-pay | Admitting: Internal Medicine

## 2018-07-10 ENCOUNTER — Encounter (HOSPITAL_COMMUNITY): Payer: Self-pay | Admitting: Emergency Medicine

## 2018-07-10 ENCOUNTER — Emergency Department (HOSPITAL_COMMUNITY): Payer: BLUE CROSS/BLUE SHIELD

## 2018-07-10 ENCOUNTER — Inpatient Hospital Stay (HOSPITAL_COMMUNITY)
Admission: EM | Admit: 2018-07-10 | Discharge: 2018-07-13 | DRG: 247 | Disposition: A | Discharge status: Home / Self Care | Payer: BLUE CROSS/BLUE SHIELD | Attending: Internal Medicine | Admitting: Internal Medicine

## 2018-07-10 DIAGNOSIS — R739 Hyperglycemia, unspecified: Secondary | ICD-10-CM | POA: Diagnosis present

## 2018-07-10 DIAGNOSIS — Z1159 Encounter for screening for other viral diseases: Secondary | ICD-10-CM

## 2018-07-10 DIAGNOSIS — E669 Obesity, unspecified: Secondary | ICD-10-CM | POA: Diagnosis present

## 2018-07-10 DIAGNOSIS — G8929 Other chronic pain: Secondary | ICD-10-CM | POA: Diagnosis present

## 2018-07-10 DIAGNOSIS — I259 Chronic ischemic heart disease, unspecified: Secondary | ICD-10-CM

## 2018-07-10 DIAGNOSIS — R072 Precordial pain: Secondary | ICD-10-CM | POA: Diagnosis not present

## 2018-07-10 DIAGNOSIS — Z66 Do not resuscitate: Secondary | ICD-10-CM | POA: Diagnosis present

## 2018-07-10 DIAGNOSIS — I10 Essential (primary) hypertension: Secondary | ICD-10-CM | POA: Diagnosis not present

## 2018-07-10 DIAGNOSIS — I25110 Atherosclerotic heart disease of native coronary artery with unstable angina pectoris: Principal | ICD-10-CM | POA: Diagnosis present

## 2018-07-10 DIAGNOSIS — E1165 Type 2 diabetes mellitus with hyperglycemia: Secondary | ICD-10-CM | POA: Diagnosis present

## 2018-07-10 DIAGNOSIS — R079 Chest pain, unspecified: Secondary | ICD-10-CM | POA: Diagnosis present

## 2018-07-10 DIAGNOSIS — E039 Hypothyroidism, unspecified: Secondary | ICD-10-CM | POA: Diagnosis not present

## 2018-07-10 DIAGNOSIS — Z955 Presence of coronary angioplasty implant and graft: Secondary | ICD-10-CM

## 2018-07-10 DIAGNOSIS — Z8249 Family history of ischemic heart disease and other diseases of the circulatory system: Secondary | ICD-10-CM

## 2018-07-10 DIAGNOSIS — Z6841 Body Mass Index (BMI) 40.0 and over, adult: Secondary | ICD-10-CM

## 2018-07-10 DIAGNOSIS — I200 Unstable angina: Secondary | ICD-10-CM

## 2018-07-10 DIAGNOSIS — M545 Low back pain: Secondary | ICD-10-CM | POA: Diagnosis present

## 2018-07-10 DIAGNOSIS — I160 Hypertensive urgency: Secondary | ICD-10-CM | POA: Diagnosis present

## 2018-07-10 DIAGNOSIS — E785 Hyperlipidemia, unspecified: Secondary | ICD-10-CM | POA: Diagnosis present

## 2018-07-10 DIAGNOSIS — Z823 Family history of stroke: Secondary | ICD-10-CM

## 2018-07-10 LAB — BASIC METABOLIC PANEL
Anion gap: 12 (ref 5–15)
BUN: 14 mg/dL (ref 6–20)
CO2: 24 mmol/L (ref 22–32)
Calcium: 9.6 mg/dL (ref 8.9–10.3)
Chloride: 101 mmol/L (ref 98–111)
Creatinine, Ser: 0.93 mg/dL (ref 0.44–1.00)
GFR calc Af Amer: >60 mL/min (ref >60)
GFR calc non Af Amer: >60 mL/min (ref >60)
Glucose, Bld: 117 mg/dL — ABNORMAL HIGH (ref 70–99)
Potassium: 3.9 mmol/L (ref 3.5–5.1)
Sodium: 137 mmol/L (ref 135–145)

## 2018-07-10 LAB — CBC
HCT: 38.8 % (ref 36.0–46.0)
Hemoglobin: 12.5 g/dL (ref 12.0–15.0)
MCH: 29.8 pg (ref 26.0–34.0)
MCHC: 32.2 g/dL (ref 30.0–36.0)
MCV: 92.6 fL (ref 80.0–100.0)
Platelets: 314 10*3/uL (ref 150–400)
RBC: 4.19 MIL/uL (ref 3.87–5.11)
RDW: 14.3 % (ref 11.5–15.5)
WBC: 9.5 10*3/uL (ref 4.0–10.5)
nRBC: 0 % (ref 0.0–0.2)

## 2018-07-10 LAB — TROPONIN I (HIGH SENSITIVITY)
Troponin I (High Sensitivity): 3 ng/L (ref <18)
Troponin I (High Sensitivity): 13 ng/L (ref <18)

## 2018-07-10 IMAGING — CT CT ANGIOGRAPHY CHEST
3 of 10 series · 16 of 46 positions shown · IV contrast (OMNI)
Comparison: Chest radiographs earlier today.

CLINICAL DATA: 58-year-old female with central chest, epigastric
pain since last night, progressive today.

EXAM:
CT ANGIOGRAPHY CHEST WITH CONTRAST
TECHNIQUE: Multidetector CT imaging of the chest was performed using the
standard protocol during bolus administration of intravenous
contrast. Multiplanar CT image reconstructions and MIPs were
obtained to evaluate the vascular anatomy.
CONTRAST:  100mL OMNIPAQUE IOHEXOL 350 MG/ML SOLN

[Series 4: without 5.0 i31f 1 · axial · non-contrast · 0.72mm/px · z∈[-368,-223]mm · 4 of 49 slices shown]
[im 10/49  lung]
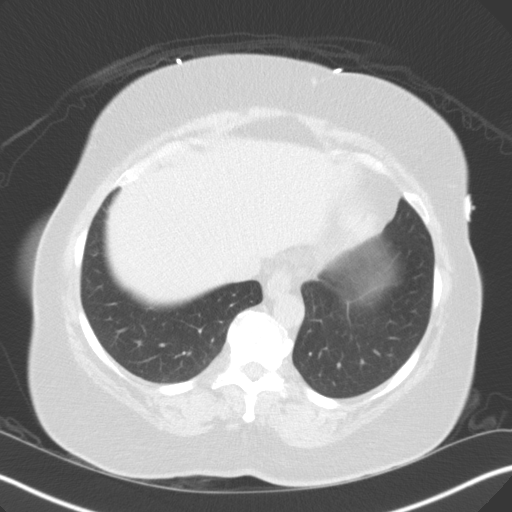
[im 20/49  lung]
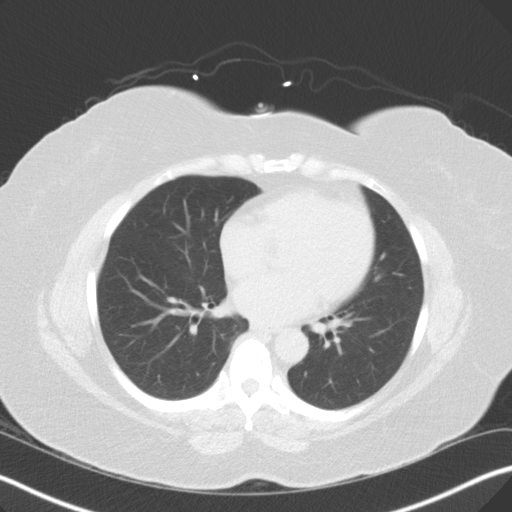
[im 29/49  lung]
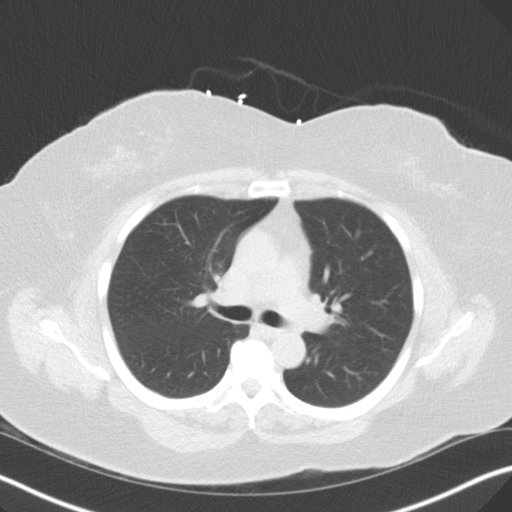
[im 39/49  lung]
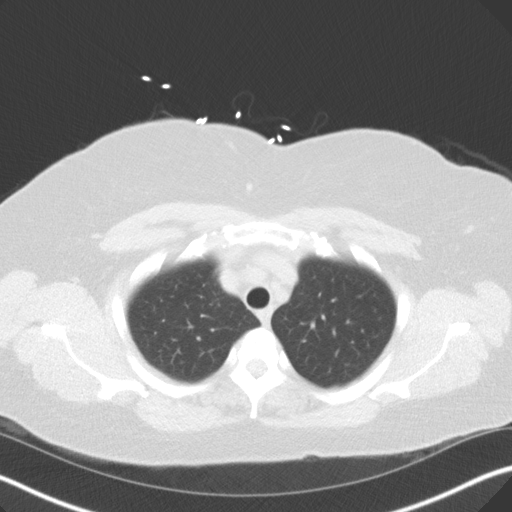

[Series 8: dissection 3.0 i30f 3 · axial · 0.77mm/px · z∈[-418,-226]mm · 9 of 82 slices shown]
[im 9/82  lung]
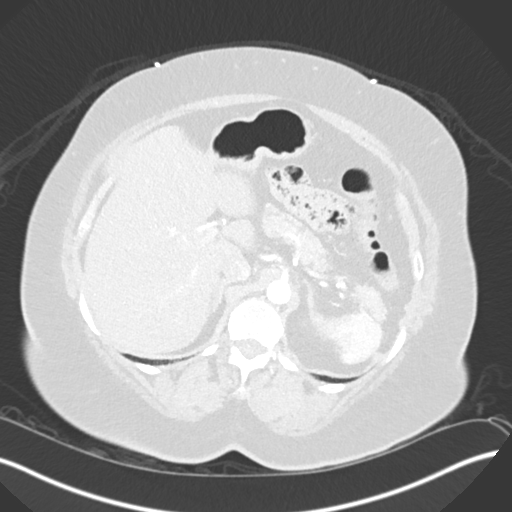
[im 17/82  soft-tissue]
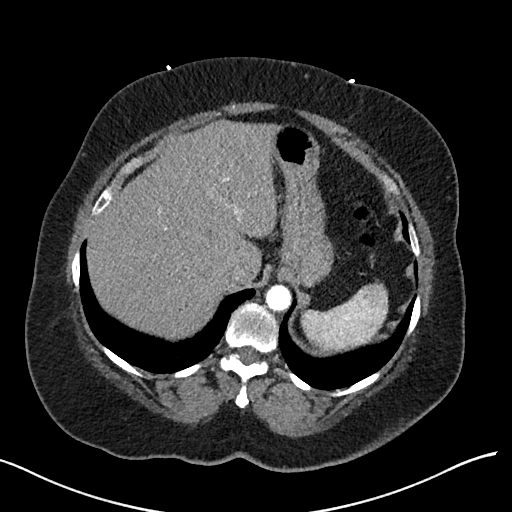
[im 25/82  lung]
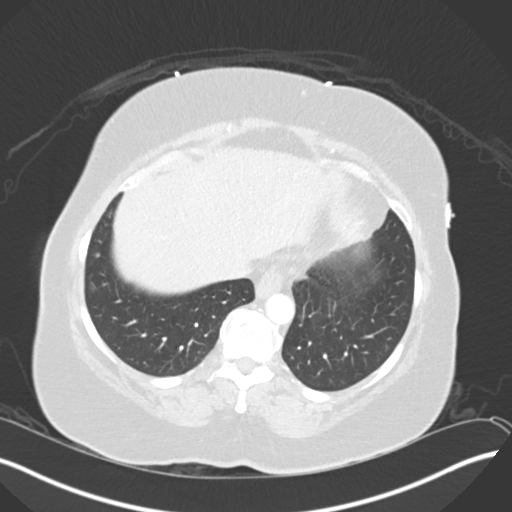
[im 33/82  soft-tissue]
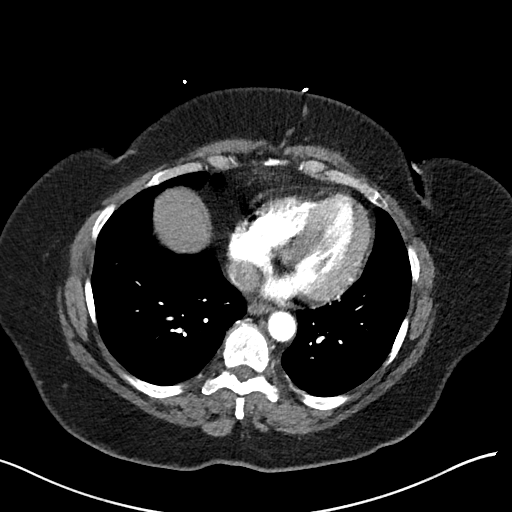
[im 41/82  lung]
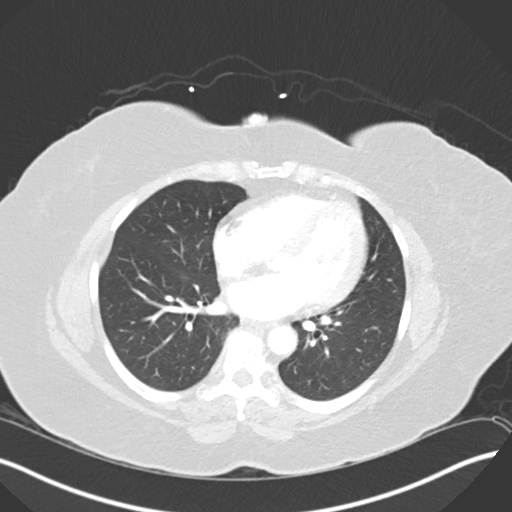
[im 49/82  soft-tissue]
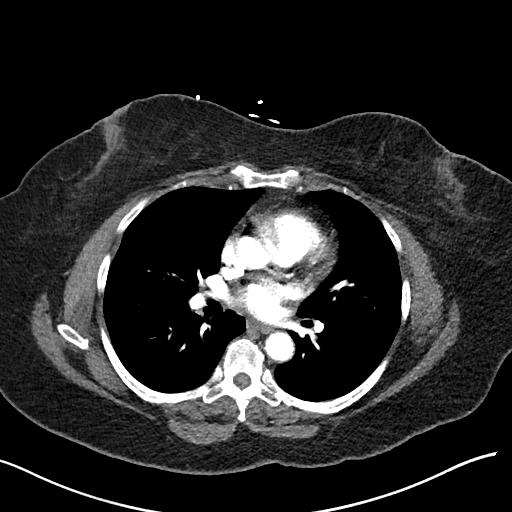
[im 57/82  lung]
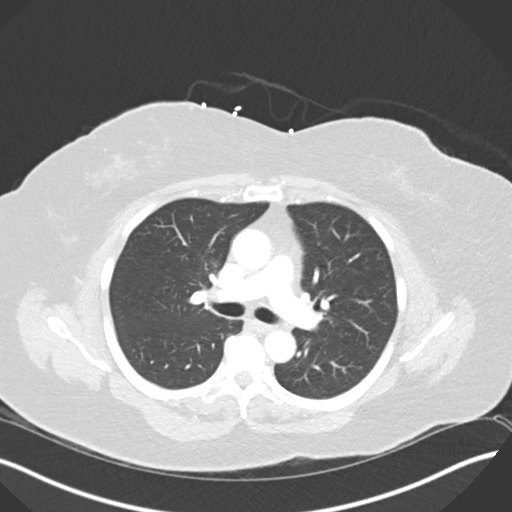
[im 65/82  soft-tissue]
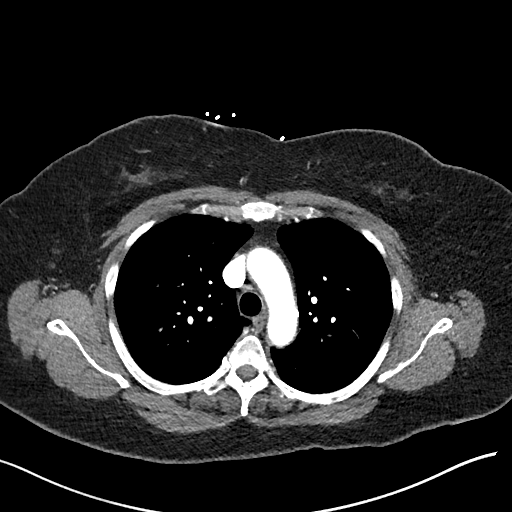
[im 73/82  lung]
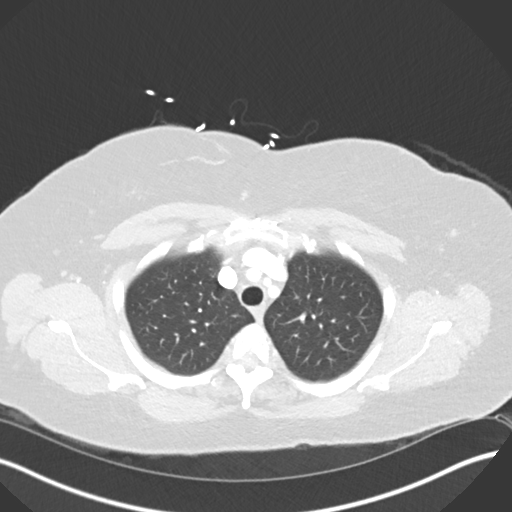

[Series 11: coronals · coronal · 0.50mm/px · 3 of 151 slices shown]
[im 38/151  soft-tissue]
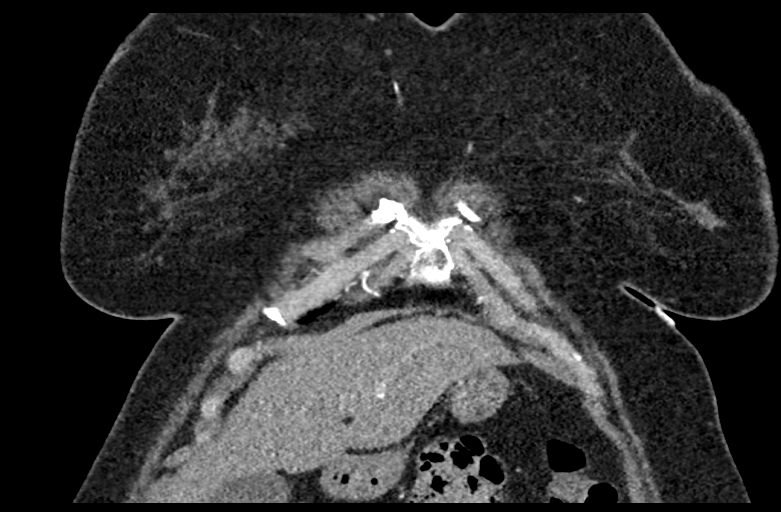
[im 76/151  soft-tissue]
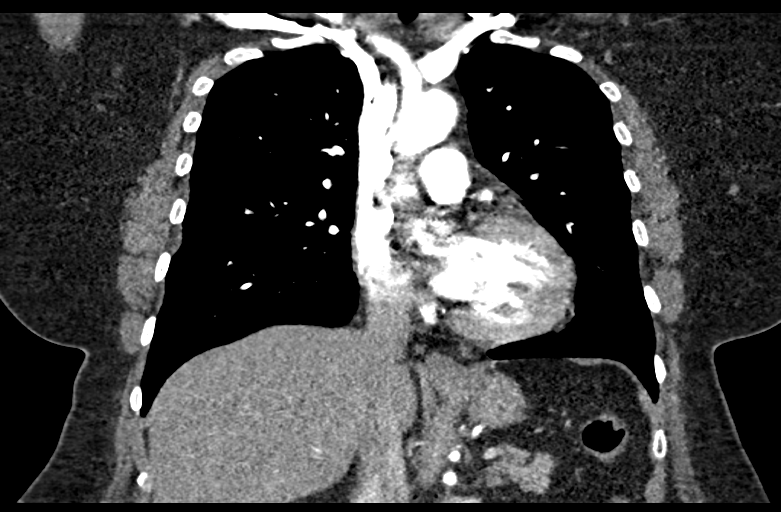
[im 113/151  soft-tissue]
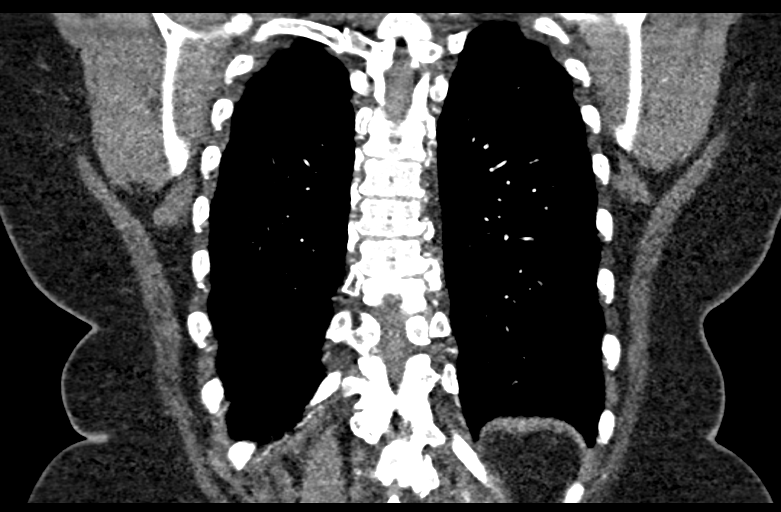

[16 of 46 positions shown; findings below may reference images not displayed]

FINDINGS: Cardiovascular: Mild Calcified aortic atherosclerosis. There is
calcified coronary artery atherosclerosis identified on series 4,
image 24. No cardiomegaly. No pericardial effusion.

Negative for thoracic aortic aneurysm or dissection. Negative
visible upper abdominal aorta. Three vessel arch configuration with
negative proximal great vessels.

Mediastinum/Nodes: Negative. No lymphadenopathy.

Lungs/Pleura: Major airways are patent. No pleural effusion. Minimal
atelectasis in the right costophrenic angle, otherwise negative
lungs.

Upper Abdomen: Possible hepatic steatosis but otherwise negative
visible liver, gallbladder, spleen, pancreas, adrenal glands,
kidneys, and bowel in the upper abdomen.

Musculoskeletal: Disc and endplate degeneration in the thoracic
spine with occasional vacuum disc. No acute osseous abnormality
identified.

Review of the MIP images confirms the above findings.
IMPRESSION: 1. Negative for thoracic aortic aneurysm or dissection. Positive for
calcified coronary artery atherosclerosis, minimal aortic
atherosclerosis.
2. No acute findings in the chest.

## 2018-07-10 IMAGING — DX CHEST - 2 VIEW
2 series · 2 of 2 positions shown · non-contrast
Comparison: Radiographs [DATE].

CLINICAL DATA: Chest pain.

EXAM:
CHEST - 2 VIEW

[chest pa]
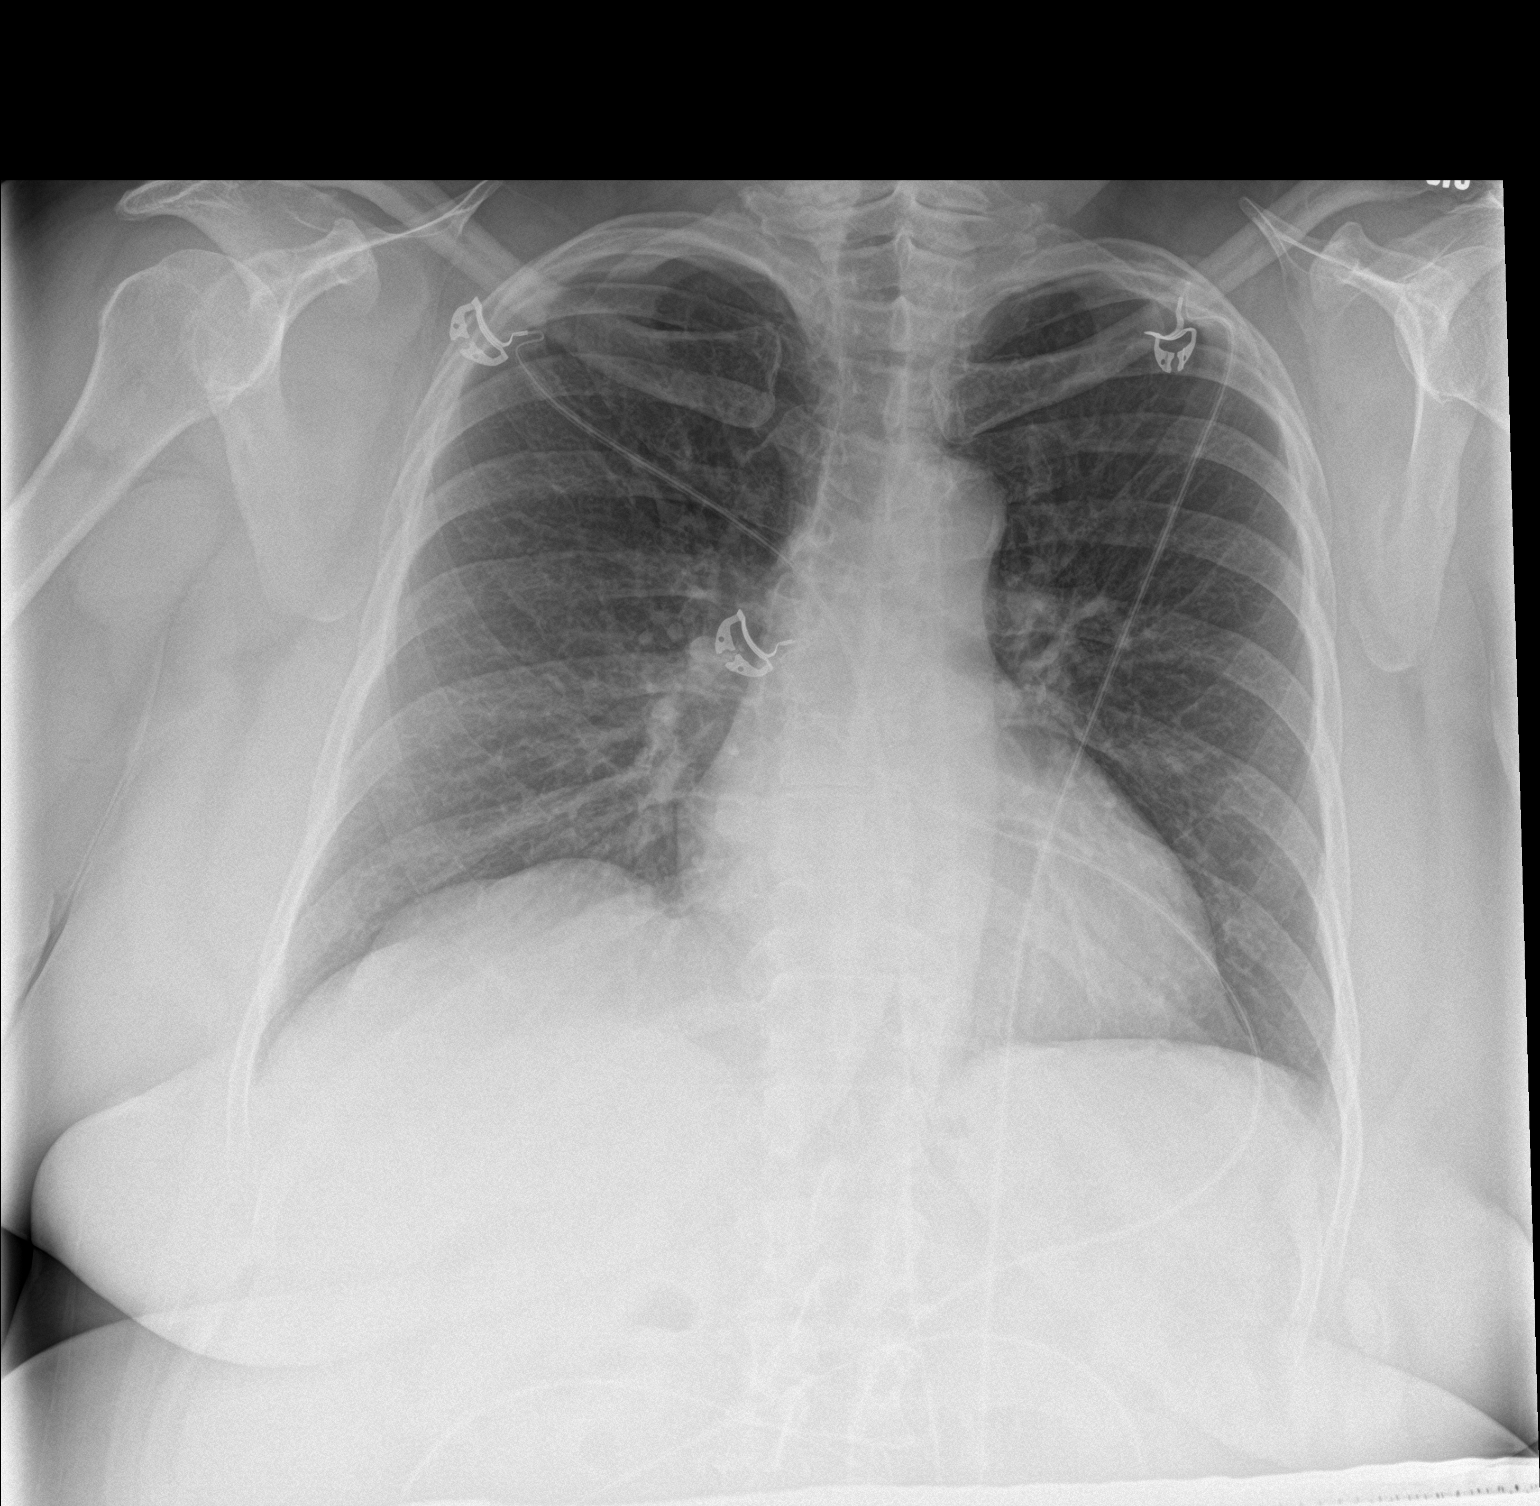

[chest lat]
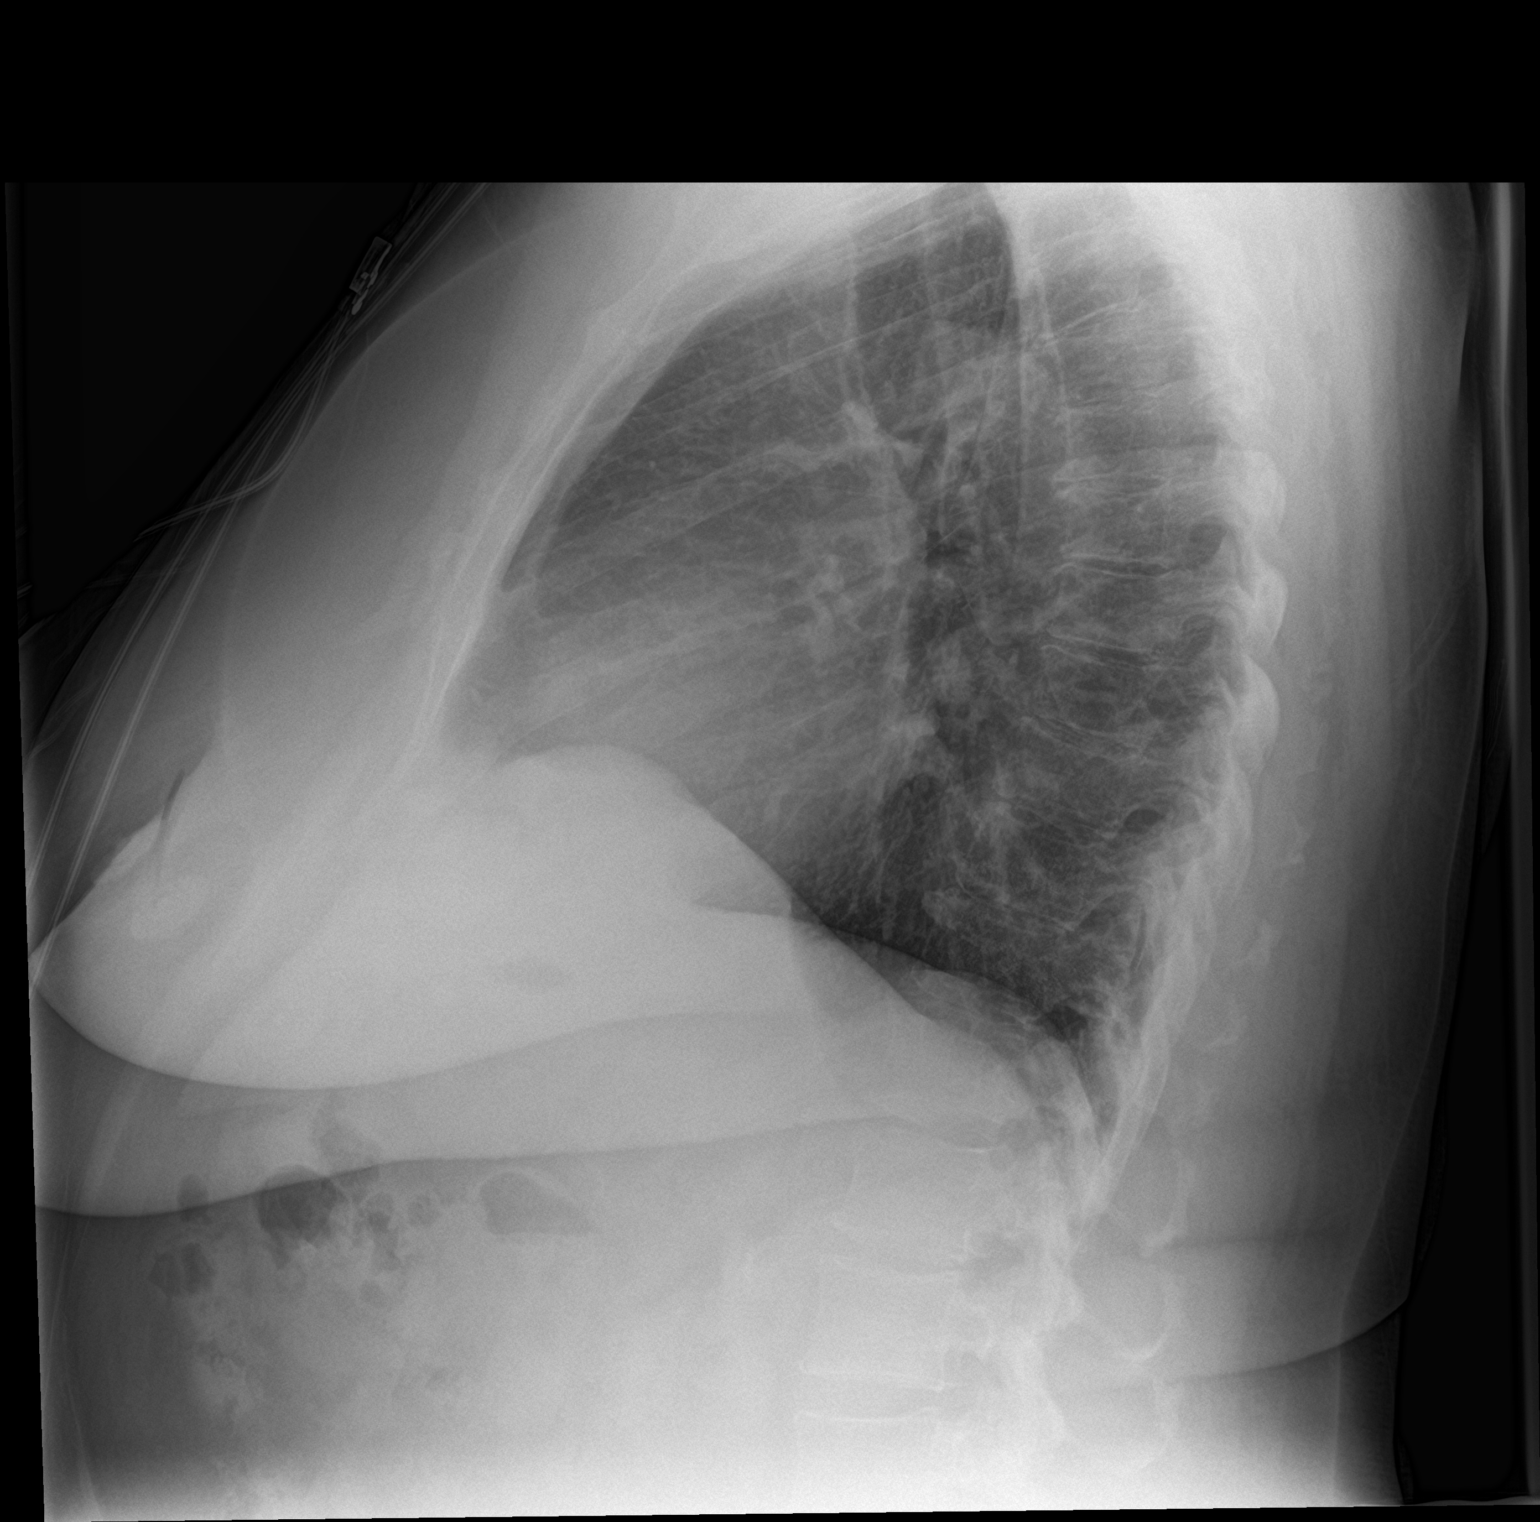

[2 of 2 positions shown; findings below may reference images not displayed]

FINDINGS: The heart size and mediastinal contours are within normal limits.
Both lungs are clear. No pneumothorax or pleural effusion is noted.
The visualized skeletal structures are unremarkable.
IMPRESSION: No active cardiopulmonary disease.

## 2018-07-10 MED ORDER — IRBESARTAN 150 MG PO TABS
300.0000 mg | ORAL_TABLET | Freq: Every day | ORAL | Status: DC
  Administered 2018-07-11 – 2018-07-13 (×3): 300 mg via ORAL
  Filled 2018-07-10: qty 1
  Filled 2018-07-10: qty 2
  Filled 2018-07-10: qty 1

## 2018-07-10 MED ORDER — IOHEXOL 350 MG/ML SOLN
100.0000 mL | Freq: Once | INTRAVENOUS | Status: AC | PRN
  Administered 2018-07-10: 100 mL via INTRAVENOUS

## 2018-07-10 MED ORDER — ASPIRIN EC 81 MG PO TBEC
81.0000 mg | DELAYED_RELEASE_TABLET | Freq: Every day | ORAL | Status: DC
  Administered 2018-07-11 – 2018-07-13 (×2): 81 mg via ORAL
  Filled 2018-07-10 (×2): qty 1

## 2018-07-10 MED ORDER — ASPIRIN 81 MG PO CHEW: 324.0000 mg | CHEWABLE_TABLET | Freq: Once | ORAL | Status: DC

## 2018-07-10 MED ORDER — OLMESARTAN MEDOXOMIL-HCTZ 40-25 MG PO TABS: 1.0000 | ORAL_TABLET | Freq: Every day | ORAL | Status: DC

## 2018-07-10 MED ORDER — ACETAMINOPHEN 325 MG PO TABS
650.0000 mg | ORAL_TABLET | ORAL | Status: DC | PRN
  Administered 2018-07-12: 650 mg via ORAL
  Filled 2018-07-10: qty 2

## 2018-07-10 MED ORDER — ENOXAPARIN SODIUM 40 MG/0.4ML ~~LOC~~ SOLN
40.0000 mg | SUBCUTANEOUS | Status: DC
  Administered 2018-07-10 – 2018-07-11 (×2): 40 mg via SUBCUTANEOUS
  Filled 2018-07-10 (×2): qty 0.4

## 2018-07-10 MED ORDER — SODIUM CHLORIDE 0.9% FLUSH
3.0000 mL | Freq: Once | INTRAVENOUS | Status: AC
  Administered 2018-07-10: 3 mL via INTRAVENOUS

## 2018-07-10 MED ORDER — ONDANSETRON HCL 4 MG/2ML IJ SOLN: 4.0000 mg | Freq: Four times a day (QID) | INTRAMUSCULAR | Status: DC | PRN

## 2018-07-10 MED ORDER — CYCLOBENZAPRINE HCL 10 MG PO TABS
10.0000 mg | ORAL_TABLET | Freq: Every day | ORAL | Status: DC | PRN
  Administered 2018-07-10 – 2018-07-12 (×3): 10 mg via ORAL
  Filled 2018-07-10 (×3): qty 1

## 2018-07-10 MED ORDER — GABAPENTIN 300 MG PO CAPS
300.0000 mg | ORAL_CAPSULE | Freq: Every day | ORAL | Status: DC
  Administered 2018-07-10 – 2018-07-12 (×3): 300 mg via ORAL
  Filled 2018-07-10 (×3): qty 1

## 2018-07-10 MED ORDER — LORATADINE 10 MG PO TABS
10.0000 mg | ORAL_TABLET | Freq: Every day | ORAL | Status: DC
  Administered 2018-07-11 – 2018-07-13 (×3): 10 mg via ORAL
  Filled 2018-07-10 (×3): qty 1

## 2018-07-10 MED ORDER — NITROGLYCERIN 0.4 MG SL SUBL
0.4000 mg | SUBLINGUAL_TABLET | SUBLINGUAL | Status: DC | PRN
  Administered 2018-07-10 (×2): 0.4 mg via SUBLINGUAL
  Filled 2018-07-10 (×2): qty 1

## 2018-07-10 MED ORDER — HYDROCHLOROTHIAZIDE 25 MG PO TABS
25.0000 mg | ORAL_TABLET | Freq: Every day | ORAL | Status: DC
  Administered 2018-07-11 – 2018-07-13 (×3): 25 mg via ORAL
  Filled 2018-07-10 (×3): qty 1

## 2018-07-10 NOTE — ED Provider Notes (Signed)
Emergency Department Provider Note   I have reviewed the triage vital signs and the nursing notes.   HISTORY  Chief Complaint Chest Pain   HPI [name] is a 58 y.o. female with PMH of elevated BMI and HTN presents to the emergency department for evaluation of substernal chest pressure returning this morning.  Patient had an episode last night at approximately 11 PM which woke her from sleep.  She states she drank water and went back to bed.  She awoke this morning with no chest pain or pressure but developed shortly before noon today.  Denies any nausea, vomiting, diaphoresis.  No radiation of symptoms.  On arrival the patient's pain was severe but states it is decreased now to 4/10. Patient took a BC powder prior to arrival.  Patient tells me that she had an abnormal stress test in 2018 but no symptoms.  She went for heart catheterization which was normal at that time.    Past Medical History:  Diagnosis Date   Allergy    Arthritis    back    Atypical chest pain 06/30/2016   Back pain    DUE TO INJURY AT WORK ON05/2013   Bruises easily    Chronic lower back pain    Depression    was on meds 2 yrs ago    Headache(784.0)    rarely   History of bronchitis    last time about 1890yrs ago    Hypertension    takes Benicar daily   Hypothyroidism 06/30/2016   Vitamin D deficiency    takes Vitamin D 2 times/wk   Weakness    and numbness right foot    Patient Active Problem List   Diagnosis Date Noted   Chest pain 07/10/2018   Hyperglycemia 07/10/2018   Abnormal stress test 08/06/2016   Essential hypertension 06/30/2016   Hypothyroidism 06/30/2016   Atypical chest pain 06/30/2016    Past Surgical History:  Procedure Laterality Date   BACK SURGERY     CARPAL TUNNEL RELEASE Right    CESAREAN SECTION  1988   COLONOSCOPY     EXPLORATORY LAPAROTOMY  1991   "took out fatty tumor" (11/09/2012)   LEFT HEART CATH AND CORONARY ANGIOGRAPHY N/A  08/06/2016   Procedure: Left Heart Cath and Coronary Angiography;  Surgeon: Lyn RecordsSmith, Henry W, MD;  [facility];  Service: Cardiovascular;  Laterality: N/A;   LUMBAR LAMINECTOMY/DECOMPRESSION MICRODISCECTOMY  11/09/2012   "L3-4" (11/09/2012)   LUMBAR LAMINECTOMY/DECOMPRESSION MICRODISCECTOMY N/A 11/09/2012   Procedure: L3-L4 DECOMPRESSION AND MICRODISCECTOMY   (1 LEVEL);  Surgeon: Venita Lickahari Brooks, MD;  [facility];  Service: Orthopedics;  Laterality: N/A;    Allergies Patient has no known allergies.  Family History  Problem Relation Age of Onset   Heart disease Mother    CAD Mother    Stroke Mother    Heart disease Sister    Liver disease Brother    Colon cancer Neg Hx    Esophageal cancer Neg Hx    Rectal cancer Neg Hx    Stomach cancer Neg Hx     Social History Social History   Tobacco Use   Smoking status: Never Smoker   Smokeless tobacco: Never Used  Substance Use Topics   Alcohol use: Yes    Alcohol/week: 7.0 standard drinks    Types: 7 Glasses of wine per week    Comment: social   Drug use: No    Review of Systems  Constitutional: No  fever/chills Eyes: No visual changes. ENT: No sore throat. Cardiovascular: Positive chest pain. Respiratory: Denies shortness of breath. Gastrointestinal: No abdominal pain.  No nausea, no vomiting.  No diarrhea.  No constipation. Genitourinary: Negative for dysuria. Musculoskeletal: Negative for back pain. Skin: Negative for rash. Neurological: Negative for headaches, focal weakness or numbness.  10-point ROS otherwise negative.  ____________________________________________   PHYSICAL EXAM:  VITAL SIGNS: ED Triage Vitals  Enc Vitals Group     BP 07/10/18 1217 (!) 204/124     Pulse Rate 07/10/18 1217 99     Resp 07/10/18 1217 16     Temp 07/10/18 1217 98.9 F (37.2 C)     Temp Source 07/10/18 1217 Oral     SpO2 07/10/18 1217 99 %     Pain Score 07/10/18 1218 9   Constitutional:  Alert and oriented. Well appearing and in no acute distress. Eyes: Conjunctivae are normal.  Head: Atraumatic. Nose: No congestion/rhinnorhea. Mouth/Throat: Mucous membranes are moist.  Neck: No stridor.   Cardiovascular: Normal rate, regular rhythm. Good peripheral circulation. Grossly normal heart sounds.   Respiratory: Normal respiratory effort.  No retractions. Lungs CTAB. Gastrointestinal: Soft and nontender. No distention.  Musculoskeletal: No lower extremity tenderness nor edema. No gross deformities of extremities. Neurologic:  Normal speech and language. No gross focal neurologic deficits are appreciated.  Skin:  Skin is warm, dry and intact. No rash noted.  ____________________________________________   LABS (all labs ordered are listed, but only abnormal results are displayed)  Labs Reviewed  BASIC METABOLIC PANEL - Abnormal; Notable for the following components:      Result Value   Glucose, Bld 117 (*)    All other components within normal limits  NOVEL CORONAVIRUS, NAA (HOSPITAL ORDER, SEND-OUT TO REF LAB)  CBC  TROPONIN I (HIGH SENSITIVITY)  TROPONIN I (HIGH SENSITIVITY)  HIV ANTIBODY (ROUTINE TESTING W REFLEX)  RAPID URINE DRUG SCREEN, HOSP PERFORMED  LIPID PANEL   ____________________________________________  EKG   EKG Interpretation  Date/Time:  Monday July 10 2018 12:22:36 EDT Ventricular Rate:  90 PR Interval:  136 QRS Duration: 68 QT Interval:  344 QTC Calculation: 420 R Axis:   51 Text Interpretation:  Normal sinus rhythm Cannot rule out Anterior infarct , age undetermined Abnormal ECG New ST changes lateral and inferior.  Confirmed by Nanda Quinton [id number]) on 07/10/2018 12:26:10 PM       ____________________________________________  RADIOLOGY  Dg Chest 2 View  Result Date: 07/10/2018 CLINICAL DATA:  Chest pain. EXAM: CHEST - 2 VIEW COMPARISON:  Radiographs of December 22, 2016. FINDINGS: The heart size and mediastinal contours are within  normal limits. Both lungs are clear. No pneumothorax or pleural effusion is noted. The visualized skeletal structures are unremarkable. IMPRESSION: No active cardiopulmonary disease. Electronically Signed   By: Marijo Conception M.D.   On: 07/10/2018 13:08   Ct Angio Chest Aorta W/cm &/or Wo/cm  Result Date: 07/10/2018 CLINICAL DATA:  58 year old female with central chest, epigastric pain since last night, progressive today. EXAM: CT ANGIOGRAPHY CHEST WITH CONTRAST TECHNIQUE: Multidetector CT imaging of the chest was performed using the standard protocol during bolus administration of intravenous contrast. Multiplanar CT image reconstructions and MIPs were obtained to evaluate the vascular anatomy. CONTRAST:  175mL OMNIPAQUE IOHEXOL 350 MG/ML SOLN COMPARISON:  Chest radiographs earlier today. FINDINGS: Cardiovascular: Mild Calcified aortic atherosclerosis. There is calcified coronary artery atherosclerosis identified on series 4, image 24. No cardiomegaly. No pericardial effusion. Negative for thoracic aortic aneurysm or dissection. Negative  visible upper abdominal aorta. Three vessel arch configuration with negative proximal great vessels. Mediastinum/Nodes: Negative. No lymphadenopathy. Lungs/Pleura: Major airways are patent. No pleural effusion. Minimal atelectasis in the right costophrenic angle, otherwise negative lungs. Upper Abdomen: Possible hepatic steatosis but otherwise negative visible liver, gallbladder, spleen, pancreas, adrenal glands, kidneys, and bowel in the upper abdomen. Musculoskeletal: Disc and endplate degeneration in the thoracic spine with occasional vacuum disc. No acute osseous abnormality identified. Review of the MIP images confirms the above findings. IMPRESSION: 1. Negative for thoracic aortic aneurysm or dissection. Positive for calcified coronary artery atherosclerosis, minimal aortic atherosclerosis. 2. No acute findings in the chest. Electronically Signed   By: Odessa FlemingH  Hall M.D.   On:  07/10/2018 17:54    ____________________________________________   PROCEDURES  Procedure(s) performed:   Procedures  None  ____________________________________________   INITIAL IMPRESSION / ASSESSMENT AND PLAN / ED COURSE  Pertinent labs & imaging results that were available during my care of the patient were reviewed by me and considered in my medical decision making (see chart for details).   Patient presents to the emergency department with chest pressure starting last night but occurring intermittently.  She is having active pain on arrival with new ST changes both lateral and inferior.  Question elevation in aVR but does not meet STEMI criteria.  [facility] so will not give additional aspirin but will give nitroglycerin.  Patient was significantly elevated blood pressure.  Lower suspicion for hypertensive emergency as opposed to ACS.  Patient did have clean cath in 2018 as reviewed in epic. Doubt PE clinically. HEART score 5.   Lab work reviewed.  Discussed with Dr. Ophelia CharterYates with the hospitalist service regarding admission.  She requests cardiology consultation prior to admit citing the patient's catheter in 2018 and negative initial troponin here.  Will draw the second troponin and follow with cardiology recommendation.  Consulted cardiology who will be down to see the patient.  Given ST depressions which appear new on her EKG today would feel uncomfortable with discharge but defer this decision to the inpatient and cardiology service ____________________________________________  FINAL CLINICAL IMPRESSION(S) / ED DIAGNOSES  Final diagnoses:  Precordial chest pain     MEDICATIONS GIVEN DURING THIS VISIT:  Medications  nitroGLYCERIN (NITROSTAT) SL tablet 0.4 mg (0.4 mg Sublingual Given 07/10/18 1242)  cyclobenzaprine (FLEXERIL) tablet 10 mg (has no administration in time range)  olmesartan-hydrochlorothiazide (BENICAR HCT) 40-25 MG per tablet 1  tablet (has no administration in time range)  loratadine (CLARITIN) tablet 10 mg (has no administration in time range)  gabapentin (NEURONTIN) capsule 300 mg (has no administration in time range)  enoxaparin (LOVENOX) injection 40 mg (has no administration in time range)  acetaminophen (TYLENOL) tablet 650 mg (has no administration in time range)  ondansetron (ZOFRAN) injection 4 mg (has no administration in time range)  aspirin EC tablet 81 mg (has no administration in time range)  sodium chloride flush (NS) 0.9 % injection 3 mL (3 mLs Intravenous Given 07/10/18 1242)  iohexol (OMNIPAQUE) 350 MG/ML injection 100 mL (100 mLs Intravenous Contrast Given 07/10/18 1728)    Note:  This document was prepared using Dragon voice recognition software and may include unintentional dictation errors.  Alona BeneJoshua Lenka Zhao, MD Emergency Medicine    Melodye Swor, Arlyss RepressJoshua G, MD 07/10/18 [phone]

## 2018-07-10 NOTE — H&P (Signed)
History and Physical    Elisha HeadlandBlanche R Battaglia JYN:[id number] DOB: Apr 10, 1960 DOA: 07/10/2018  PCP: Dorothyann PengSanders, Robyn, MD Consultants:  Shon BatonBrooks - orthopedics; Everlena CooperJaffe- neurology; Duke Salviaandolph - cardiology Patient coming from:  Home - lives with son and grandson; NOK: Prescott ParmaSon, James Perry, [phone]   Chief Complaint:  Chest pain  HPI: Elisha HeadlandBlanche R Stanish is a 58 y.o. female with medical history significant of hypothyroidism; HTN; and atypical chest pain (negative cath in 7/18) presenting chest pain.  She woke up overnight with chest pressure.  She rolled over and drank some water with a little improvement.  This AM, she noticed recurrence of the pressure while going to fix lunch.  It seemed to get tighter and tighter.  Substernal pressure that was worse with exertion.  It seemed to resolve spontaneously.  She just got up to use the bathroom and had similar recurrence of pain; it seems to be improving again now with rest.  She had a negative cath in 2018 after her PCP noticed unusual changes on her EKG; she was referred to cards, had a false positive stress test, and had clear coronaries on cath.  She notes recent significant stress with the death of her son's friend.  She has also been taking a lot of cyclobenzaprine and Neurontin for leg and back pain, when she had not been taking.  Her BP was markedly unusual on presentation, improved while in the ER.  ED Course:   Chest pain last night and again today around 11, improved with NTG.  ST depressions noted.  HS troponin negative.  Not comfortable with delta trop and sending home.  Story is concerning.  Cardiology will consult and if patient needs admission [facility] will admit.  Review of Systems: As per HPI; otherwise review of systems reviewed and negative.   Ambulatory Status:  Ambulates without assistance  Past Medical History:  Diagnosis Date   Allergy    Arthritis    back    Atypical chest pain 06/30/2016   Back pain    DUE TO INJURY AT WORK ON05/2013    Bruises easily    Chronic lower back pain    Depression    was on meds 2 yrs ago    Headache(784.0)    rarely   History of bronchitis    last time about 7886yrs ago    Hypertension    takes Benicar daily   Hypothyroidism 06/30/2016   Vitamin D deficiency    takes Vitamin D 2 times/wk   Weakness    and numbness right foot    Past Surgical History:  Procedure Laterality Date   BACK SURGERY     CARPAL TUNNEL RELEASE Right    CESAREAN SECTION  1988   COLONOSCOPY     EXPLORATORY LAPAROTOMY  1991   "took out fatty tumor" (11/09/2012)   LEFT HEART CATH AND CORONARY ANGIOGRAPHY N/A 08/06/2016   Procedure: Left Heart Cath and Coronary Angiography;  Surgeon: Lyn RecordsSmith, Henry W, MD;  [facility];  Service: Cardiovascular;  Laterality: N/A;   LUMBAR LAMINECTOMY/DECOMPRESSION MICRODISCECTOMY  11/09/2012   "L3-4" (11/09/2012)   LUMBAR LAMINECTOMY/DECOMPRESSION MICRODISCECTOMY N/A 11/09/2012   Procedure: L3-L4 DECOMPRESSION AND MICRODISCECTOMY   (1 LEVEL);  Surgeon: Venita Lickahari Brooks, MD;  [facility];  Service: Orthopedics;  Laterality: N/A;    Social History   Socioeconomic History   Marital status: Divorced    Spouse name: Not on file   Number of children: 1   Years of education: Not on file  Highest education level: Some college, no degree  Occupational History   Occupation: Production designer, [employer]  Social Needs   Financial resource strain: Not on file   Food insecurity    Worry: Not on file    Inability: Not on file   Transportation needs    Medical: Not on file    Non-medical: Not on file  Tobacco Use   Smoking status: Never Smoker   Smokeless tobacco: Never Used  Substance and Sexual Activity   Alcohol use: Yes    Alcohol/week: 7.0 standard drinks    Types: 7 Glasses of wine per week    Comment: social   Drug use: No   Sexual activity: Yes    Birth control/protection: Post-menopausal  Lifestyle   Physical activity     Days per week: Not on file    Minutes per session: Not on file   Stress: Not on file  Relationships   Social connections    Talks on phone: Not on file    Gets together: Not on file    Attends religious service: Not on file    Active member of club or organization: Not on file    Attends meetings of clubs or organizations: Not on file    Relationship status: Not on file   Intimate partner violence    Fear of current or ex partner: Not on file    Emotionally abused: Not on file    Physically abused: Not on file    Forced sexual activity: Not on file  Other Topics Concern   Not on file  Social History Narrative   Pt is right handed. She is divorced, lives with her son and grandson. She occasionally drinks caffeine, no regular exercise. 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    No Known Allergies  Family History  Problem Relation Age of Onset   Heart disease Mother    CAD Mother    Stroke Mother    Heart disease Sister    Liver disease Brother    Colon cancer Neg Hx    Esophageal cancer Neg Hx    Rectal cancer Neg Hx    Stomach cancer Neg Hx     Prior to Admission medications   Medication Sig Start Date End Date Taking? Authorizing Provider  Aspirin-Salicylamide-Caffeine (BC FAST PAIN RELIEF) [phone] MG PACK Take 1 Package by mouth as needed (Back pain).   Yes [provider]  cyclobenzaprine (FLEXERIL) 10 MG tablet TAKE 1 TABLET (10 MG TOTAL) BY MOUTH DAILY AS NEEDED FOR MUSCLE SPASMS. 05/02/18  Yes Dorothyann PengSanders, Robyn, MD  loratadine (CLARITIN) 10 MG tablet Take 10 mg by mouth daily.   Yes [provider]  olmesartan-hydrochlorothiazide (BENICAR HCT) 40-25 MG tablet TAKE 1 TABLET BY MOUTH EVERY DAY Patient taking differently: Take 1 tablet by mouth daily.  06/14/18  Yes Dorothyann PengSanders, Robyn, MD  VITAMIN D PO Take 1 tablet by mouth daily.   Yes [provider]  fluticasone (FLONASE) 50 MCG/ACT nasal spray  INHALE 1 SPRAY BY INTRANASAL ROUTE EVERY DAY IN EACH NOSTRIL Patient not taking: Reported on 07/10/2018 04/04/18   Dorothyann PengSanders, Robyn, MD  gabapentin (NEURONTIN) 300 MG capsule TAKE 1 CAPSULE BY MOUTH EVERYDAY AT BEDTIME Patient not taking: Reported on 07/10/2018 11/08/17   Drema DallasJaffe, Adam R, DO  ibuprofen (ADVIL,MOTRIN) 600 MG tablet Take 1 tablet (600 mg total) by mouth every 6 (six) hours as needed. Patient not taking: Reported on 03/14/2018 12/22/16  Julianne Rice, MD  levocetirizine (XYZAL) 5 MG tablet TAKE 1 TABLET BY MOUTH EVERY DAY Patient not taking: Reported on 07/10/2018 12/12/17   Glendale Chard, MD  methocarbamol (ROBAXIN) 500 MG tablet Take 2 tablets (1,000 mg total) by mouth every 8 (eight) hours as needed for muscle spasms. Patient not taking: Reported on 03/14/2018 12/22/16   Julianne Rice, MD  ondansetron (ZOFRAN ODT) 8 MG disintegrating tablet Take 1 tablet (8 mg total) by mouth every 8 (eight) hours as needed for nausea or vomiting. Patient not taking: Reported on 07/10/2018 12/25/16   Dorie Rank, MD    Physical Exam: Vitals:   07/10/18 1534 07/10/18 1600 07/10/18 1630 07/10/18 1700  BP: (!) 146/91 (!) 146/76 (!) 145/71 136/63  Pulse: 85 86 84 87  Resp: 13 18 18 18   Temp:      TempSrc:      SpO2: 97% 99% 97% 97%      General:  Appears calm and comfortable and is NAD  Eyes:  PERRL, EOMI, normal lids, iris  ENT:  grossly normal hearing, lips & tongue, mmm; appropriate dentition  Neck:  no LAD, masses or thyromegaly  Cardiovascular:  RRR, no m/r/g. No LE edema.   Respiratory:   CTA bilaterally with no wheezes/rales/rhonchi.  Normal respiratory effort.  Abdomen:  soft, NT, ND, NABS  Back:   normal alignment, no CVAT  Skin:  no rash or induration seen on limited exam  Musculoskeletal:  grossly normal tone BUE/BLE, good ROM, no bony abnormality  Lower extremity:  No LE edema.  Limited foot exam with no ulcerations.  2+ distal pulses.  Psychiatric:  grossly normal  mood and affect, speech fluent and appropriate, AOx3  Neurologic:  CN 2-12 grossly intact, moves all extremities in coordinated fashion, sensation intact    Radiological Exams on Admission: Dg Chest 2 View  Result Date: 07/10/2018 CLINICAL DATA:  Chest pain. EXAM: CHEST - 2 VIEW COMPARISON:  Radiographs of December 22, 2016. FINDINGS: The heart size and mediastinal contours are within normal limits. Both lungs are clear. No pneumothorax or pleural effusion is noted. The visualized skeletal structures are unremarkable. IMPRESSION: No active cardiopulmonary disease. Electronically Signed   By: Marijo Conception M.D.   On: 07/10/2018 13:08   Ct Angio Chest Aorta W/cm &/or Wo/cm  Result Date: 07/10/2018 CLINICAL DATA:  58 year old female with central chest, epigastric pain since last night, progressive today. EXAM: CT ANGIOGRAPHY CHEST WITH CONTRAST TECHNIQUE: Multidetector CT imaging of the chest was performed using the standard protocol during bolus administration of intravenous contrast. Multiplanar CT image reconstructions and MIPs were obtained to evaluate the vascular anatomy. CONTRAST:  116mL OMNIPAQUE IOHEXOL 350 MG/ML SOLN COMPARISON:  Chest radiographs earlier today. FINDINGS: Cardiovascular: Mild Calcified aortic atherosclerosis. There is calcified coronary artery atherosclerosis identified on series 4, image 24. No cardiomegaly. No pericardial effusion. Negative for thoracic aortic aneurysm or dissection. Negative visible upper abdominal aorta. Three vessel arch configuration with negative proximal great vessels. Mediastinum/Nodes: Negative. No lymphadenopathy. Lungs/Pleura: Major airways are patent. No pleural effusion. Minimal atelectasis in the right costophrenic angle, otherwise negative lungs. Upper Abdomen: Possible hepatic steatosis but otherwise negative visible liver, gallbladder, spleen, pancreas, adrenal glands, kidneys, and bowel in the upper abdomen. Musculoskeletal: Disc and endplate  degeneration in the thoracic spine with occasional vacuum disc. No acute osseous abnormality identified. Review of the MIP images confirms the above findings. IMPRESSION: 1. Negative for thoracic aortic aneurysm or dissection. Positive for calcified coronary artery atherosclerosis, minimal aortic atherosclerosis.  2. No acute findings in the chest. Electronically Signed   By: Odessa FlemingH  Hall M.D.   On: 07/10/2018 17:54    EKG: Independently reviewed.  NSR with rate 90; new nonspecific ST changes when compared to prior with ST depression in the lateral and inferior leads   Labs on Admission: I have personally reviewed the available labs and imaging studies at the time of the admission.  Pertinent labs:   Glucose 117 HS troponin 3, 13 Normal CBC A1c on 3/3 was 5.9 TSH on 3/3 was WNL   Assessment/Plan Principal Problem:   Chest pain Active Problems:   Essential hypertension   Hypothyroidism   Hyperglycemia    Chest pain -Patient with substernal chest pressure that is exertional, appears to resolve with rest or spontaneously. -She has had multiple episodes since overnight. -CXR unremarkable.   -HS troponin negative x 2 with negative delta.  -EKG with nonspecific changes concerning for ischemia. -Negative cath within 2 years so cardiac chest pain seems unlikely.  -HEART score is 4 indicating that the patient has an elevated risk score and requires further evaluation. -Will plan to place in observation status on telemetry to rule out ACS by overnight observation.  -Repeat EKG in AM -Start ASA 81 mg  daily -Risk factor stratification with FLP; will also check UDS -Cardiology consultation  -Coronary CTA recommended by cardiology -Stress is a definite consideration for the source of her symptoms  HTN -Continue home Benicar-HCT -Poor control at time of presentation, likely associated with stress as BP has normalized without significant intervention while here  HLD -Check  lipids  DM -Last A1c was 5.9 -There is no indication to start medication at this time, but at increased risk for DM - particularly when considering body habitus -Weight loss and exercise should be encouraged   Note: This patient has been tested and is pending for the novel coronavirus COVID-19.  DVT prophylaxis: Lovenox  Code Status:  DNR - confirmed with patient Family Communication: None present Disposition Plan:  Home once clinically improved Consults called: Cardiology  Admission status: It is my clinical opinion that referral for OBSERVATION is reasonable and necessary in this patient based on the above information provided. The aforementioned taken together are felt to place the patient at high risk for further clinical deterioration. However it is anticipated that the patient may be medically stable for discharge from the hospital within 24 to 48 hours.    Jonah BlueJennifer Walda Hertzog MD [facility]   How to contact the [facility] Attending or Consulting provider 7A - 7P or covering provider during after hours 7P -7A, for this patient?  1. Check the care team in [facility] and look for a) attending/consulting [facility] provider listed and b) the [facility] team listed 2. Log into [email or web address] and use [place]'s universal password to access. If you do not have the password, please contact the hospital operator. 3. Locate the [facility] provider you are looking for under [facility] and page to a number that you can be directly reached. 4. If you still have difficulty reaching the provider, please page the [facility] (Director on Call) for the Hospitalists listed on amion for assistance.   07/10/2018, 6:09 PM

## 2018-07-10 NOTE — ED Triage Notes (Signed)
Patient reports onset non-radiating epigastric/central chest pain late last night that worsened at 11am today. She reports hx HTN and chest pain, but nothing like this before. Patient tearful in triage. Denies shortness of breath, dizziness, N/V. Resp e/u, skin w/d.

## 2018-07-10 NOTE — ED Notes (Signed)
ED Provider at bedside. 

## 2018-07-10 NOTE — Progress Notes (Signed)
Patient admitted to [facility] for evaluation of chest pain. On arrival, patient A/O X4. MAE. Cardiac monitor applied and pt made comfortable in bed. Safety measures put in place and call bell placed within reach.

## 2018-07-10 NOTE — Consult Note (Addendum)
Cardiology Consultation:   Patient ID: [name] MRN: [id number]; DOB: 1960-03-08  Admit date: 07/10/2018 Date of Consult: 07/10/2018  Primary Care Provider: Dorothyann PengSanders, Robyn, MD Primary Cardiologist: Chilton Siiffany Florence, MD  Patient Profile:   [name] is a 58 y.o. female with a hx of HTN, family hx of early CAD, hypothyroidism and obesity who is being seen today for the evaluation of Chest pain  at the request of Dr. Ophelia CharterYates.   Her sister died of a heart attack at age 58 without any preceding symptoms.  She also has a family history of long QT syndrome. Her mother, aunt, and 2 cousins all have this disease.  Seen by Dr. Duke Salviaandolph in 2018 for atypical type chest pain. ETT 07/22/16 revealed ST depressions.  She achieved 10.1 METs on the Bruce protocol.  She was referred for left heart catheterization that showed normal coronary arteries. Echo showed LVEF 65-70% and grade 2 diastolic dysfunction.  Pulmonary pressures were mildly elevated (PASP 48 mmHg).  She was doing well on cardiac stand point when last seen by Dr. Duke Salviaandolph 08/2016.  History of Present Illness:   [name] presented with chest pain. She work up from last night with substernal chest pressure. No associated symptoms. She drink water and went back to sleep. Seems symptoms lasted for few minutes. She had recurrent chest pain this morning around 11am while got up to prepare lunch. This time she had nausea but no vomiting, diaphoresis, dyspnea or radiation. Rated 10/10 and came to ER. Her pain eventually subsided in ER before got SL nitro x 1. She had recurrent pain while ambulation in ER. Currently 4-5/10 pain but resting comfortably. Recently dealing with L groin/back pain. Taking pain medications frequently. Prior hx of back surgery. No palpitations, orthopnea, PND or syncope. No fever, chills, cough, congestion or exposure to COVID.   In ER, initial BP of 204/104, now improved to 146/76. Reports normally SBP runs in 130s.  Hs-Troponin 3. Scr 0.93. K 3.9. CXR without active cardiopulmonary disease.   Heart Pathway Score:  HEAR Score: 5  Past Medical History:  Diagnosis Date  . Allergy   . Arthritis    back   . Atypical chest pain 06/30/2016  . Back pain    DUE TO INJURY AT WORK ON05/2013  . Bruises easily   . Chronic lower back pain   . Depression    was on meds 2 yrs ago   . Headache(784.0)    rarely  . History of bronchitis    last time about 2671yrs ago   . Hypertension    takes Benicar daily  . Hypothyroidism 06/30/2016  . Vitamin D deficiency    takes Vitamin D 2 times/wk  . Weakness    and numbness right foot    Past Surgical History:  Procedure Laterality Date  . BACK SURGERY    . CARPAL TUNNEL RELEASE Right   . CESAREAN SECTION  1988  . COLONOSCOPY    . EXPLORATORY LAPAROTOMY  1991   "took out fatty tumor" (11/09/2012)  . LEFT HEART CATH AND CORONARY ANGIOGRAPHY N/A 08/06/2016   Procedure: Left Heart Cath and Coronary Angiography;  Surgeon: Lyn RecordsSmith, Henry W, MD;  [facility];  Service: Cardiovascular;  Laterality: N/A;  . LUMBAR LAMINECTOMY/DECOMPRESSION MICRODISCECTOMY  11/09/2012   "L3-4" (11/09/2012)  . LUMBAR LAMINECTOMY/DECOMPRESSION MICRODISCECTOMY N/A 11/09/2012   Procedure: L3-L4 DECOMPRESSION AND MICRODISCECTOMY   (1 LEVEL);  Surgeon: Venita Lickahari Brooks, MD;  [facility];  Service: Orthopedics;  Laterality: N/A;    Inpatient Medications: Scheduled Meds: . aspirin  324 mg Oral Once   Continuous Infusions:  PRN Meds: nitroGLYCERIN  Allergies:   No Known Allergies  Social History:   Social History   Socioeconomic History  . Marital status: Divorced    Spouse name: Not on file  . Number of children: 1  . Years of education: Not on file  . Highest education level: Some college, no degree  Occupational History  . Not on file  Social Needs  . Financial resource strain: Not on file  . Food insecurity    Worry: Not on file    Inability: Not on file  .  Transportation needs    Medical: Not on file    Non-medical: Not on file  Tobacco Use  . Smoking status: Never Smoker  . Smokeless tobacco: Never Used  Substance and Sexual Activity  . Alcohol use: Yes    Alcohol/week: 7.0 standard drinks    Types: 7 Glasses of wine per week    Comment: social  . Drug use: No  . Sexual activity: Yes    Birth control/protection: Post-menopausal  Lifestyle  . Physical activity    Days per week: Not on file    Minutes per session: Not on file  . Stress: Not on file  Relationships  . Social Herbalist on phone: Not on file    Gets together: Not on file    Attends religious service: Not on file    Active member of club or organization: Not on file    Attends meetings of clubs or organizations: Not on file    Relationship status: Not on file  . Intimate partner violence    Fear of current or ex partner: Not on file    Emotionally abused: Not on file    Physically abused: Not on file    Forced sexual activity: Not on file  Other Topics Concern  . Not on file  Social History Narrative   Pt is right handed. She is divorced, lives with her son and grandson. She occasionally drinks caffeine, no regular exercise. 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    Family History:   Family History  Problem Relation Age of Onset  . Heart disease Mother   . CAD Mother   . Stroke Mother   . Heart disease Sister   . Liver disease Brother   . Colon cancer Neg Hx   . Esophageal cancer Neg Hx   . Rectal cancer Neg Hx   . Stomach cancer Neg Hx      ROS:  Please see the history of present illness.  All other ROS reviewed and negative.     Physical Exam/Data:   Vitals:   07/10/18 1245 07/10/18 1315 07/10/18 1400 07/10/18 1534  BP: (!) 147/96 132/86 (!) 145/87 (!) 146/91  Pulse: 90 87 77 85  Resp: 14 20 19 13   Temp:      TempSrc:      SpO2: 100% 99% 100% 97%   No intake or output data in the 24 hours ending  07/10/18 1559 Last 3 Weights 03/14/2018 05/16/2017 12/25/2016  Weight (lbs) 196 lb 12.8 oz 194 lb 195 lb  Weight (kg) 89.268 kg 87.998 kg 88.451 kg     There is no height or weight on file to calculate BMI.  General:  Obese female in no acute distress HEENT: normal Lymph: no adenopathy Neck:  no JVD Endocrine:  No thryomegaly Vascular: No carotid bruits; FA pulses 2+ bilaterally without bruits  Cardiac:  normal S1, S2; RRR; no murmur Lungs:  clear to auscultation bilaterally, no wheezing, rhonchi or rales  Abd: soft, nontender, no hepatomegaly  Ext: no edema Musculoskeletal:  No deformities, BUE and BLE strength normal and equal Skin: warm and dry  Neuro:  CNs 2-12 intact, no focal abnormalities noted Psych:  Normal affect   EKG:  The EKG was personally reviewed and demonstrates:  SR at rate of 90 bpm, minimal ST depression inferior laterally (similar to abnormal ETT in 2018 but less prominent)   Relevant CV Studies:  Echo 07/2016 Study Conclusions  - Left ventricle: The cavity size was normal. Wall thickness was   normal. Systolic function was vigorous. The estimated ejection   fraction was in the range of 65% to 70%. Wall motion was normal;   there were no regional wall motion abnormalities. Features are   consistent with a pseudonormal left ventricular filling pattern,   with concomitant abnormal relaxation and increased filling   pressure (grade 2 diastolic dysfunction). - Mitral valve: There was mild regurgitation. - Pulmonary arteries: Systolic pressure was moderately increased.   PA peak pressure: 48 mm Hg (S).  Echo 07/2016  Normal coronary arteries.  Normal left ventricular function and hemodynamics.  False positive electrocardiographic response to exercise.  Laboratory Data:  High Sensitivity Troponin:   Recent Labs  Lab 07/10/18 1222  TROPONINIHS 3     Chemistry Recent Labs  Lab 07/10/18 1222  NA 137  K 3.9  CL 101  CO2 24  GLUCOSE 117*  BUN 14   CREATININE 0.93  CALCIUM 9.6  GFRNONAA >60  GFRAA >60  ANIONGAP 12    Hematology Recent Labs  Lab 07/10/18 1222  WBC 9.5  RBC 4.19  HGB 12.5  HCT 38.8  MCV 92.6  MCH 29.8  MCHC 32.2  RDW 14.3  PLT 314   Radiology/Studies:  Dg Chest 2 View  Result Date: 07/10/2018 CLINICAL DATA:  Chest pain. EXAM: CHEST - 2 VIEW COMPARISON:  Radiographs of December 22, 2016. FINDINGS: The heart size and mediastinal contours are within normal limits. Both lungs are clear. No pneumothorax or pleural effusion is noted. The visualized skeletal structures are unremarkable. IMPRESSION: No active cardiopulmonary disease. Electronically Signed   By: Lupita RaiderJames  Green Jr M.D.   On: 07/10/2018 13:08   Assessment and Plan:   1. Chest pain Difficult to determine etiology. Hs-troponin 3. EKG with minimal inferior lateral ST depression (less prominent then electrocardiographic ETT, She was hypertensive during test). She had recurrent pain with ambulation in ER. Her BP was elevated on presentation but now improved.  - Differential includes GI etiology (nausea), MSK (groin/back issue recently) or dissection (elevated BP on presentation but improved).  - She had normal coronaries by cath in 07/2016. Less suspecious that she could have CAD. Will get CT angiogram to r/o dissection.   2. HTN - SBP in 130s at home. Here 204/124 on presentation, now improved.  - Continue home Benicar - HCT.   3. Groin pain - Per primary team    For questions or updates, please contact [facility] Please consult [email or web address] for contact info under     Lorelei PontSigned, Bhavinkumar Bhagat, [place]  07/10/2018 3:59 PM    Attending Note:   The patient was seen and examined.  Agree with assessment and plan as noted above.  Changes made to the above note as needed.  Patient seen and independently examined with Chelsea AusVin Bhagat, PA .   We discussed all aspects of the encounter. I agree with the assessment and plan as stated above.  1.   Chest  pain :   Very atypical.   Troponin is negative.    She had a normal cath in 2018.   I doubt this is due to ACS.    She was markedly hypertensive and she needs to have a CT Angio of the chest to rule out aortic dissection.   She has been admitted by [facility].   Her ST depression has been present when she is hypertensive.  She had a false positive GXT due to hypertensive response also .   2.  HTN:   Still eats salty foods.  Has been eating bologna for the past several weeks.   advised salt restriction.  3.   Obesity:   Advised exercise, weight loss      I have spent a total of 40 minutes with patient reviewing hospital  notes , telemetry, EKGs, labs and examining patient as well as establishing an assessment and plan that was discussed with the patient. > 50% of time was spent in direct patient care.    Vesta MixerPhilip J. Denessa Cavan, Montez HagemanJr., MD, [facility] 07/10/2018, 4:57 PM [address],  [address] Office [phone]- [phone] Pager [phone]- [phone]

## 2018-07-10 NOTE — ED Notes (Signed)
Patient transported to X-ray 

## 2018-07-10 NOTE — ED Provider Notes (Signed)
Sent from Dr. Laverta Baltimore.  58 year old female here with chest pain and EKG changes.  Does have a recent cath a few years ago that was clean. Physical Exam  BP (!) 145/87   Pulse 77   Temp 98.9 F (37.2 C) (Oral)   Resp 19   LMP 06/03/2011   SpO2 100%   Physical Exam  ED Course/Procedures   Clinical Course as of Jul 10 905  Mon Jul 10, 2018  1756 Patient was seen by cardiology although they did not discuss anything with me.  It looks like they put the patient in for CT dissection protocol.  I talked to the hospitalist Dr. Lorin Mercy who will evaluate the patient after she returns from CAT scan for admission.   [MB]    Clinical Course User Index [MB] Hayden Rasmussen, MD    Procedures  MDM  Hospitalist is aware of patient for admission.  They are asking that cardiology consult first and if admission is still recommended they will admit the patient and to recall them.  Disposition per cardiology recommendations.  Delta troponin still pending       Hayden Rasmussen, MD 07/11/18 [phone]

## 2018-07-11 ENCOUNTER — Other Ambulatory Visit: Payer: Self-pay

## 2018-07-11 DIAGNOSIS — E669 Obesity, unspecified: Secondary | ICD-10-CM | POA: Diagnosis present

## 2018-07-11 DIAGNOSIS — Z1159 Encounter for screening for other viral diseases: Secondary | ICD-10-CM | POA: Diagnosis not present

## 2018-07-11 DIAGNOSIS — Z66 Do not resuscitate: Secondary | ICD-10-CM | POA: Diagnosis present

## 2018-07-11 DIAGNOSIS — I200 Unstable angina: Secondary | ICD-10-CM

## 2018-07-11 DIAGNOSIS — M545 Low back pain: Secondary | ICD-10-CM | POA: Diagnosis present

## 2018-07-11 DIAGNOSIS — I10 Essential (primary) hypertension: Secondary | ICD-10-CM | POA: Diagnosis present

## 2018-07-11 DIAGNOSIS — I208 Other forms of angina pectoris: Secondary | ICD-10-CM | POA: Diagnosis not present

## 2018-07-11 DIAGNOSIS — I160 Hypertensive urgency: Secondary | ICD-10-CM | POA: Diagnosis present

## 2018-07-11 DIAGNOSIS — R072 Precordial pain: Secondary | ICD-10-CM

## 2018-07-11 DIAGNOSIS — Z8249 Family history of ischemic heart disease and other diseases of the circulatory system: Secondary | ICD-10-CM | POA: Diagnosis not present

## 2018-07-11 DIAGNOSIS — G8929 Other chronic pain: Secondary | ICD-10-CM | POA: Diagnosis present

## 2018-07-11 DIAGNOSIS — Z955 Presence of coronary angioplasty implant and graft: Secondary | ICD-10-CM | POA: Diagnosis not present

## 2018-07-11 DIAGNOSIS — R079 Chest pain, unspecified: Secondary | ICD-10-CM | POA: Diagnosis present

## 2018-07-11 DIAGNOSIS — Z6841 Body Mass Index (BMI) 40.0 and over, adult: Secondary | ICD-10-CM | POA: Diagnosis not present

## 2018-07-11 DIAGNOSIS — E1165 Type 2 diabetes mellitus with hyperglycemia: Secondary | ICD-10-CM | POA: Diagnosis present

## 2018-07-11 DIAGNOSIS — E785 Hyperlipidemia, unspecified: Secondary | ICD-10-CM | POA: Diagnosis present

## 2018-07-11 DIAGNOSIS — Z823 Family history of stroke: Secondary | ICD-10-CM | POA: Diagnosis not present

## 2018-07-11 DIAGNOSIS — E039 Hypothyroidism, unspecified: Secondary | ICD-10-CM | POA: Diagnosis present

## 2018-07-11 DIAGNOSIS — I25110 Atherosclerotic heart disease of native coronary artery with unstable angina pectoris: Secondary | ICD-10-CM | POA: Diagnosis present

## 2018-07-11 LAB — LIPID PANEL
Cholesterol: 217 mg/dL — ABNORMAL HIGH (ref 0–200)
HDL: 51 mg/dL (ref >40)
LDL Cholesterol: 148 mg/dL — ABNORMAL HIGH (ref 0–99)
Total CHOL/HDL Ratio: 4.3 RATIO
Triglycerides: 92 mg/dL (ref <150)
VLDL: 18 mg/dL (ref 0–40)

## 2018-07-11 LAB — RAPID URINE DRUG SCREEN, HOSP PERFORMED
Amphetamines: NOT DETECTED
Barbiturates: NOT DETECTED
Benzodiazepines: NOT DETECTED
Cocaine: NOT DETECTED
Opiates: NOT DETECTED
Tetrahydrocannabinol: NOT DETECTED

## 2018-07-11 MED ORDER — ATORVASTATIN CALCIUM 40 MG PO TABS
40.0000 mg | ORAL_TABLET | Freq: Every day | ORAL | Status: DC
  Administered 2018-07-11 – 2018-07-12 (×2): 40 mg via ORAL
  Filled 2018-07-11 (×2): qty 1

## 2018-07-11 NOTE — Progress Notes (Signed)
Patient c/o chest pressure after using the bathroom. 0.4mg  of nitroglycerin given and patient had relieved. Will keep monitoring.

## 2018-07-11 NOTE — Progress Notes (Signed)
Progress Note  Patient Name: [name] Date of Encounter: 07/11/2018  Primary Cardiologist: Skeet Latch, MD   Subjective   Pt has had more chest pain / tightness.   Occurred when she got up to go to the bathroom Pressure like ,  Relieved with SL NTG .  ECG continues to show NS abn Troponin is negative    Inpatient Medications    Scheduled Meds:  aspirin EC  81 mg Oral Daily   enoxaparin (LOVENOX) injection  40 mg Subcutaneous Q24H   gabapentin  300 mg Oral QHS   irbesartan  300 mg Oral Daily   And   hydrochlorothiazide  25 mg Oral Daily   loratadine  10 mg Oral Daily   Continuous Infusions:  PRN Meds: acetaminophen, cyclobenzaprine, nitroGLYCERIN, ondansetron (ZOFRAN) IV   Vital Signs    Vitals:   07/10/18 2332 07/11/18 0413 07/11/18 0748 07/11/18 0900  BP: 115/69 112/64  134/71  Pulse: 96 78  79  Resp: 19   16  Temp: 98.5 F (36.9 C) 97.7 F (36.5 C)  99 F (37.2 C)  TempSrc: Oral Oral  Oral  SpO2: 94% 97%  98%  Weight:   89.8 kg   Height:   5\' 1"  (1.549 m)     Intake/Output Summary (Last 24 hours) at 07/11/2018 1031 Last data filed at 07/10/2018 2006 Gross per 24 hour  Intake 120 ml  Output --  Net 120 ml   Last 3 Weights 07/11/2018 03/14/2018 05/16/2017  Weight (lbs) 198 lb 196 lb 12.8 oz 194 lb  Weight (kg) 89.812 kg 89.268 kg 87.998 kg      Telemetry    NSR  - Personally Reviewed  ECG     NSR ,  NS ST abl.  - Personally Reviewed  Physical Exam   GEN: middle age female, moderately obese.  No acute distress.   Neck: No JVD Cardiac: RRR, no murmurs, rubs, or gallops.  Respiratory: Clear to auscultation bilaterally. GI: Soft, nontender, non-distended  MS: No edema; No deformity. Neuro:  Nonfocal  Psych: Normal affect   Labs    High Sensitivity Troponin:   Recent Labs  Lab 07/10/18 1222 07/10/18 1537  TROPONINIHS 3 13      Cardiac EnzymesNo results for input(s): TROPONINI in the last 168 hours. No results for  input(s): TROPIPOC in the last 168 hours.   Chemistry Recent Labs  Lab 07/10/18 1222  NA 137  K 3.9  CL 101  CO2 24  GLUCOSE 117*  BUN 14  CREATININE 0.93  CALCIUM 9.6  GFRNONAA >60  GFRAA >60  ANIONGAP 12     Hematology Recent Labs  Lab 07/10/18 1222  WBC 9.5  RBC 4.19  HGB 12.5  HCT 38.8  MCV 92.6  MCH 29.8  MCHC 32.2  RDW 14.3  PLT 314    BNPNo results for input(s): BNP, PROBNP in the last 168 hours.   DDimer No results for input(s): DDIMER in the last 168 hours.   Radiology    Dg Chest 2 View  Result Date: 07/10/2018 CLINICAL DATA:  Chest pain. EXAM: CHEST - 2 VIEW COMPARISON:  Radiographs of December 22, 2016. FINDINGS: The heart size and mediastinal contours are within normal limits. Both lungs are clear. No pneumothorax or pleural effusion is noted. The visualized skeletal structures are unremarkable. IMPRESSION: No active cardiopulmonary disease. Electronically Signed   By: Marijo Conception M.D.   On: 07/10/2018 13:08   Ct Angio Chest Aorta  W/cm &/or Wo/cm  Result Date: 07/10/2018 CLINICAL DATA:  58 year old female with central chest, epigastric pain since last night, progressive today. EXAM: CT ANGIOGRAPHY CHEST WITH CONTRAST TECHNIQUE: Multidetector CT imaging of the chest was performed using the standard protocol during bolus administration of intravenous contrast. Multiplanar CT image reconstructions and MIPs were obtained to evaluate the vascular anatomy. CONTRAST:  100mL OMNIPAQUE IOHEXOL 350 MG/ML SOLN COMPARISON:  Chest radiographs earlier today. FINDINGS: Cardiovascular: Mild Calcified aortic atherosclerosis. There is calcified coronary artery atherosclerosis identified on series 4, image 24. No cardiomegaly. No pericardial effusion. Negative for thoracic aortic aneurysm or dissection. Negative visible upper abdominal aorta. Three vessel arch configuration with negative proximal great vessels. Mediastinum/Nodes: Negative. No lymphadenopathy. Lungs/Pleura:  Major airways are patent. No pleural effusion. Minimal atelectasis in the right costophrenic angle, otherwise negative lungs. Upper Abdomen: Possible hepatic steatosis but otherwise negative visible liver, gallbladder, spleen, pancreas, adrenal glands, kidneys, and bowel in the upper abdomen. Musculoskeletal: Disc and endplate degeneration in the thoracic spine with occasional vacuum disc. No acute osseous abnormality identified. Review of the MIP images confirms the above findings. IMPRESSION: 1. Negative for thoracic aortic aneurysm or dissection. Positive for calcified coronary artery atherosclerosis, minimal aortic atherosclerosis. 2. No acute findings in the chest. Electronically Signed   By: Odessa FlemingH  Hall M.D.   On: 07/10/2018 17:54    Cardiac Studies     Patient Profile     58 y.o. female admitted with symptoms worrisome for angina .     Assessment & Plan    1.   Chest pressure.   Worrisome.   Cath in 2018 showed very minimal CAD ( prox LAD 20-30%)   Symptoms now are c/w UPA.  ECG shows NS ST abn.    I think we should arrange for cath tomorrow .    We have discussed risks, benefits, options She understands and agrees to proceed.        For questions or updates, please contact [facility] Please consult [email or web address] for contact info under        Signed, Kristeen MissPhilip Minoru Chap, MD  07/11/2018, 10:31 AM

## 2018-07-11 NOTE — Progress Notes (Signed)
PROGRESS NOTE    Rebekah HeadlandBlanche R Wagner  ZOX:[id number] DOB: Apr 25, 1960 DOA: 07/10/2018 PCP: Dorothyann PengSanders, Robyn, MD   Brief Narrative: Patient is a 58 year old female with history of hypothyroidism, hypertension who presented to the emergency room with complaints of chest pain.  Chest pain improved with nitroglycerin.  EKG showed ST depressions.  High-sensitivity troponins negative.  Cardiology consulted and plan for cardiac cath tomorrow.  Assessment & Plan:   Principal Problem:   Chest pain Active Problems:   Essential hypertension   Hypothyroidism   Hyperglycemia   Chest pain: Presented with substernal chest pressure yesterday with exertion.  Resolved with rest.  She again had this chest pain this morning.  High-sensitivity troponins have been negative.  Chest x-ray unremarkable. She had cardiac cath 2 years ago which showed minimal coronary artery disease.  Cardiology following. CT angio showed calcified coronary artery atherosclerosis.Started on aspirin. Planning for cardiac cath tomorrow.  Hypertension: On Benicar- hydrochlorothiazide at home.  Currently blood pressure stable  Hyperlipidemia: LDL of 148.  Started on Lipitor           DVT prophylaxis: Lovenox Code Status: Full Family Communication: None present at the bedside Disposition Plan: Home after full work-up   Consultants: Cardiology  Procedures: None  Antimicrobials:  Anti-infectives (From admission, onward)   None      Subjective: Patient seen and examined the bedside this afternoon.  She complained of some chest discomfort earlier this morning.  Currently chest pain-free.  Hemodynamically stable.  Objective: Vitals:   07/11/18 0413 07/11/18 0748 07/11/18 0900 07/11/18 1138  BP: 112/64  134/71 117/80  Pulse: 78  79 96  Resp:   16 18  Temp: 97.7 F (36.5 C)  99 F (37.2 C) 98.3 F (36.8 C)  TempSrc: Oral  Oral Oral  SpO2: 97%  98% 99%  Weight:  89.8 kg    Height:  5\' 1"  (1.549 m)       Intake/Output Summary (Last 24 hours) at 07/11/2018 1336 Last data filed at 07/11/2018 1000 Gross per 24 hour  Intake 120 ml  Output 550 ml  Net -430 ml   Filed Weights   07/11/18 0748  Weight: 89.8 kg    Examination:  General exam: Appears calm and comfortable ,Not in distress,obese HEENT:PERRL,Oral mucosa moist, Ear/Nose normal on gross exam Respiratory system: Bilateral equal air entry, normal vesicular breath sounds, no wheezes or crackles  Cardiovascular system: S1 & S2 heard, RRR. No JVD, murmurs, rubs, gallops or clicks. No pedal edema. Gastrointestinal system: Abdomen is nondistended, soft and nontender. No organomegaly or masses felt. Normal bowel sounds heard. Central nervous system: Alert and oriented. No focal neurological deficits. Extremities: No edema, no clubbing ,no cyanosis, distal peripheral pulses palpable. Skin: No rashes, lesions or ulcers,no icterus ,no pallor      Data Reviewed: I have personally reviewed following labs and imaging studies  CBC: Recent Labs  Lab 07/10/18 1222  WBC 9.5  HGB 12.5  HCT 38.8  MCV 92.6  PLT 314   Basic Metabolic Panel: Recent Labs  Lab 07/10/18 1222  NA 137  K 3.9  CL 101  CO2 24  GLUCOSE 117*  BUN 14  CREATININE 0.93  CALCIUM 9.6   GFR: Estimated Creatinine Clearance: 67.2 mL/min (by C-G formula based on SCr of 0.93 mg/dL). Liver Function Tests: No results for input(s): AST, ALT, ALKPHOS, BILITOT, PROT, ALBUMIN in the last 168 hours. No results for input(s): LIPASE, AMYLASE in the last 168 hours. No results for input(s): AMMONIA  in the last 168 hours. Coagulation Profile: No results for input(s): INR, PROTIME in the last 168 hours. Cardiac Enzymes: No results for input(s): CKTOTAL, CKMB, CKMBINDEX, TROPONINI in the last 168 hours. BNP (last 3 results) No results for input(s): PROBNP in the last 8760 hours. HbA1C: No results for input(s): HGBA1C in the last 72 hours. CBG: No results for  input(s): GLUCAP in the last 168 hours. Lipid Profile: Recent Labs    07/11/18 0612  CHOL 217*  HDL 51  LDLCALC 148*  TRIG 92  CHOLHDL 4.3   Thyroid Function Tests: No results for input(s): TSH, T4TOTAL, FREET4, T3FREE, THYROIDAB in the last 72 hours. Anemia Panel: No results for input(s): VITAMINB12, FOLATE, FERRITIN, TIBC, IRON, RETICCTPCT in the last 72 hours. Sepsis Labs: No results for input(s): PROCALCITON, LATICACIDVEN in the last 168 hours.  No results found for this or any previous visit (from the past 240 hour(s)).       Radiology Studies: Dg Chest 2 View  Result Date: 07/10/2018 CLINICAL DATA:  Chest pain. EXAM: CHEST - 2 VIEW COMPARISON:  Radiographs of December 22, 2016. FINDINGS: The heart size and mediastinal contours are within normal limits. Both lungs are clear. No pneumothorax or pleural effusion is noted. The visualized skeletal structures are unremarkable. IMPRESSION: No active cardiopulmonary disease. Electronically Signed   By: Marijo Conception M.D.   On: 07/10/2018 13:08   Ct Angio Chest Aorta W/cm &/or Wo/cm  Result Date: 07/10/2018 CLINICAL DATA:  58 year old female with central chest, epigastric pain since last night, progressive today. EXAM: CT ANGIOGRAPHY CHEST WITH CONTRAST TECHNIQUE: Multidetector CT imaging of the chest was performed using the standard protocol during bolus administration of intravenous contrast. Multiplanar CT image reconstructions and MIPs were obtained to evaluate the vascular anatomy. CONTRAST:  167mL OMNIPAQUE IOHEXOL 350 MG/ML SOLN COMPARISON:  Chest radiographs earlier today. FINDINGS: Cardiovascular: Mild Calcified aortic atherosclerosis. There is calcified coronary artery atherosclerosis identified on series 4, image 24. No cardiomegaly. No pericardial effusion. Negative for thoracic aortic aneurysm or dissection. Negative visible upper abdominal aorta. Three vessel arch configuration with negative proximal great vessels.  Mediastinum/Nodes: Negative. No lymphadenopathy. Lungs/Pleura: Major airways are patent. No pleural effusion. Minimal atelectasis in the right costophrenic angle, otherwise negative lungs. Upper Abdomen: Possible hepatic steatosis but otherwise negative visible liver, gallbladder, spleen, pancreas, adrenal glands, kidneys, and bowel in the upper abdomen. Musculoskeletal: Disc and endplate degeneration in the thoracic spine with occasional vacuum disc. No acute osseous abnormality identified. Review of the MIP images confirms the above findings. IMPRESSION: 1. Negative for thoracic aortic aneurysm or dissection. Positive for calcified coronary artery atherosclerosis, minimal aortic atherosclerosis. 2. No acute findings in the chest. Electronically Signed   By: Genevie Ann M.D.   On: 07/10/2018 17:54        Scheduled Meds:  aspirin EC  81 mg Oral Daily   atorvastatin  40 mg Oral q1800   enoxaparin (LOVENOX) injection  40 mg Subcutaneous Q24H   gabapentin  300 mg Oral QHS   irbesartan  300 mg Oral Daily   And   hydrochlorothiazide  25 mg Oral Daily   loratadine  10 mg Oral Daily   Continuous Infusions:   LOS: 0 days    Time spent: 35 mins.More than 50% of that time was spent in counseling and/or coordination of care.      Shelly Coss, MD [facility] Pager [phone]  If 7PM-7AM, please contact night-coverage [email or web address] Password [email or web address] 07/11/2018, 1:36 PM

## 2018-07-12 ENCOUNTER — Other Ambulatory Visit: Payer: Self-pay

## 2018-07-12 ENCOUNTER — Encounter (HOSPITAL_COMMUNITY)
Admission: EM | Disposition: A | Discharge status: Home / Self Care | Payer: Self-pay | Source: Home / Self Care | Attending: Internal Medicine

## 2018-07-12 DIAGNOSIS — I25110 Atherosclerotic heart disease of native coronary artery with unstable angina pectoris: Principal | ICD-10-CM

## 2018-07-12 DIAGNOSIS — I208 Other forms of angina pectoris: Secondary | ICD-10-CM

## 2018-07-12 DIAGNOSIS — I200 Unstable angina: Secondary | ICD-10-CM

## 2018-07-12 DIAGNOSIS — E669 Obesity, unspecified: Secondary | ICD-10-CM

## 2018-07-12 HISTORY — PX: LEFT HEART CATH AND CORONARY ANGIOGRAPHY: CATH118249

## 2018-07-12 LAB — POCT ACTIVATED CLOTTING TIME
Activated Clotting Time: 252 seconds
Activated Clotting Time: 274 seconds

## 2018-07-12 LAB — NOVEL CORONAVIRUS, NAA (HOSP ORDER, SEND-OUT TO REF LAB; TAT 18-24 HRS): SARS-CoV-2, NAA: NOT DETECTED

## 2018-07-12 LAB — HIV ANTIBODY (ROUTINE TESTING W REFLEX): HIV Screen 4th Generation wRfx: NONREACTIVE

## 2018-07-12 LAB — SARS CORONAVIRUS 2 BY RT PCR (HOSPITAL ORDER, PERFORMED IN ~~LOC~~ HOSPITAL LAB): SARS Coronavirus 2: NEGATIVE

## 2018-07-12 SURGERY — LEFT HEART CATH AND CORONARY ANGIOGRAPHY
Anesthesia: LOCAL

## 2018-07-12 MED ORDER — SODIUM CHLORIDE 0.9 % IV SOLN
INTRAVENOUS | Status: AC
  Administered 2018-07-12: 16:00:00 via INTRAVENOUS

## 2018-07-12 MED ORDER — MIDAZOLAM HCL 2 MG/2ML IJ SOLN
INTRAMUSCULAR | Status: AC
  Filled 2018-07-12: qty 2

## 2018-07-12 MED ORDER — NITROGLYCERIN 1 MG/10 ML FOR IR/CATH LAB
INTRA_ARTERIAL | Status: AC
  Filled 2018-07-12: qty 10

## 2018-07-12 MED ORDER — SODIUM CHLORIDE 0.9 % IV SOLN: 250.0000 mL | INTRAVENOUS | Status: DC | PRN

## 2018-07-12 MED ORDER — VERAPAMIL HCL 2.5 MG/ML IV SOLN
INTRAVENOUS | Status: AC
  Filled 2018-07-12: qty 2

## 2018-07-12 MED ORDER — SODIUM CHLORIDE 0.9 % WEIGHT BASED INFUSION
3.0000 mL/kg/h | INTRAVENOUS | Status: DC
  Administered 2018-07-12: 3 mL/kg/h via INTRAVENOUS

## 2018-07-12 MED ORDER — SODIUM CHLORIDE 0.9% FLUSH: 3.0000 mL | INTRAVENOUS | Status: DC | PRN

## 2018-07-12 MED ORDER — SODIUM CHLORIDE 0.9 % IV SOLN
INTRAVENOUS | Status: AC | PRN
  Administered 2018-07-12: 89.8 mL via INTRAVENOUS

## 2018-07-12 MED ORDER — HEPARIN (PORCINE) IN NACL 1000-0.9 UT/500ML-% IV SOLN
INTRAVENOUS | Status: DC | PRN
  Administered 2018-07-12 (×2): 500 mL

## 2018-07-12 MED ORDER — NITROGLYCERIN 1 MG/10 ML FOR IR/CATH LAB
INTRA_ARTERIAL | Status: DC | PRN
  Administered 2018-07-12: 200 ug via INTRACORONARY

## 2018-07-12 MED ORDER — CLOPIDOGREL BISULFATE 300 MG PO TABS
ORAL_TABLET | ORAL | Status: DC | PRN
  Administered 2018-07-12: 600 mg via ORAL

## 2018-07-12 MED ORDER — HEPARIN SODIUM (PORCINE) 1000 UNIT/ML IJ SOLN
INTRAMUSCULAR | Status: AC
  Filled 2018-07-12: qty 1

## 2018-07-12 MED ORDER — CLOPIDOGREL BISULFATE 300 MG PO TABS
ORAL_TABLET | ORAL | Status: AC
  Filled 2018-07-12: qty 2

## 2018-07-12 MED ORDER — ASPIRIN 81 MG PO CHEW: 81.0000 mg | CHEWABLE_TABLET | ORAL | Status: DC

## 2018-07-12 MED ORDER — LABETALOL HCL 5 MG/ML IV SOLN: 10.0000 mg | INTRAVENOUS | Status: AC | PRN

## 2018-07-12 MED ORDER — LIDOCAINE HCL (PF) 1 % IJ SOLN
INTRAMUSCULAR | Status: DC | PRN
  Administered 2018-07-12: 10 mL

## 2018-07-12 MED ORDER — VERAPAMIL HCL 2.5 MG/ML IV SOLN
INTRAVENOUS | Status: DC | PRN
  Administered 2018-07-12: 10 mL via INTRA_ARTERIAL

## 2018-07-12 MED ORDER — FENTANYL CITRATE (PF) 100 MCG/2ML IJ SOLN
INTRAMUSCULAR | Status: DC | PRN
  Administered 2018-07-12 (×3): 25 ug via INTRAVENOUS

## 2018-07-12 MED ORDER — MIDAZOLAM HCL 2 MG/2ML IJ SOLN
INTRAMUSCULAR | Status: DC | PRN
  Administered 2018-07-12: 2 mg via INTRAVENOUS
  Administered 2018-07-12: 1 mg via INTRAVENOUS

## 2018-07-12 MED ORDER — CLOPIDOGREL BISULFATE 75 MG PO TABS
75.0000 mg | ORAL_TABLET | Freq: Every day | ORAL | Status: DC
  Administered 2018-07-13: 75 mg via ORAL
  Filled 2018-07-12: qty 1

## 2018-07-12 MED ORDER — LIDOCAINE HCL (PF) 1 % IJ SOLN
INTRAMUSCULAR | Status: AC
  Filled 2018-07-12: qty 30

## 2018-07-12 MED ORDER — SODIUM CHLORIDE 0.9% FLUSH
3.0000 mL | Freq: Two times a day (BID) | INTRAVENOUS | Status: DC
  Administered 2018-07-12: 3 mL via INTRAVENOUS

## 2018-07-12 MED ORDER — HEPARIN SODIUM (PORCINE) 1000 UNIT/ML IJ SOLN
INTRAMUSCULAR | Status: DC | PRN
  Administered 2018-07-12: 4500 [IU] via INTRAVENOUS
  Administered 2018-07-12: 3000 [IU] via INTRAVENOUS

## 2018-07-12 MED ORDER — SODIUM CHLORIDE 0.9 % WEIGHT BASED INFUSION: 1.0000 mL/kg/h | INTRAVENOUS | Status: DC

## 2018-07-12 MED ORDER — ANGIOPLASTY BOOK
Freq: Once | Status: AC
  Administered 2018-07-13: 05:00:00
  Filled 2018-07-12: qty 1

## 2018-07-12 MED ORDER — SODIUM CHLORIDE 0.9 % WEIGHT BASED INFUSION: 3.0000 mL/kg/h | INTRAVENOUS | Status: DC

## 2018-07-12 MED ORDER — HYDRALAZINE HCL 20 MG/ML IJ SOLN: 10.0000 mg | INTRAMUSCULAR | Status: AC | PRN

## 2018-07-12 MED ORDER — FENTANYL CITRATE (PF) 100 MCG/2ML IJ SOLN
INTRAMUSCULAR | Status: AC
  Filled 2018-07-12: qty 2

## 2018-07-12 MED ORDER — ASPIRIN 81 MG PO CHEW
81.0000 mg | CHEWABLE_TABLET | ORAL | Status: AC
  Administered 2018-07-12: 81 mg via ORAL
  Filled 2018-07-12: qty 1

## 2018-07-12 MED ORDER — HEPARIN (PORCINE) IN NACL 1000-0.9 UT/500ML-% IV SOLN
INTRAVENOUS | Status: AC
  Filled 2018-07-12: qty 1000

## 2018-07-12 MED ORDER — MORPHINE SULFATE (PF) 2 MG/ML IV SOLN: 2.0000 mg | INTRAVENOUS | Status: DC | PRN

## 2018-07-12 MED ORDER — THE SENSUOUS HEART BOOK
Freq: Once | Status: AC
  Administered 2018-07-13: 05:00:00
  Filled 2018-07-12: qty 1

## 2018-07-12 SURGICAL SUPPLY — 20 items
BALLN SAPPHIRE 2.5X15 (BALLOONS) ×2
BALLN SAPPHIRE ~~LOC~~ 3.25X15 (BALLOONS) ×1 IMPLANT
BALLOON SAPPHIRE 2.5X15 (BALLOONS) IMPLANT
CATH INFINITI 5 FR JL3.5 (CATHETERS) ×1 IMPLANT
CATH LAUNCHER 5F EBU3.0 (CATHETERS) IMPLANT
CATH OPTITORQUE TIG 4.0 5F (CATHETERS) ×1 IMPLANT
CATHETER LAUNCHER 5F EBU3.0 (CATHETERS) ×2
GLIDESHEATH SLEND SS 6F .021 (SHEATH) ×1 IMPLANT
GUIDEWIRE INQWIRE 1.5J.035X260 (WIRE) IMPLANT
INQWIRE 1.5J .035X260CM (WIRE) ×2
KIT ENCORE 26 ADVANTAGE (KITS) ×1 IMPLANT
KIT HEART LEFT (KITS) ×2 IMPLANT
PACK CARDIAC CATHETERIZATION (CUSTOM PROCEDURE TRAY) ×2 IMPLANT
SHEATH PROBE COVER 6X72 (BAG) ×1 IMPLANT
STENT SYNERGY DES 3X20 (Permanent Stent) ×1 IMPLANT
SYR MEDRAD MARK 7 150ML (SYRINGE) ×2 IMPLANT
TRANSDUCER W/STOPCOCK (MISCELLANEOUS) ×2 IMPLANT
TUBING CIL FLEX 10 FLL-RA (TUBING) ×2 IMPLANT
WIRE ASAHI PROWATER 180CM (WIRE) ×1 IMPLANT
WIRE HI TORQ BMW 190CM (WIRE) ×1 IMPLANT

## 2018-07-12 NOTE — Progress Notes (Signed)
PROGRESS NOTE    Rebekah HeadlandBlanche R Wagner  ZOX:[id number] DOB: [birth year] DOA: 07/10/2018 PCP: Dorothyann PengSanders, Robyn, MD   Brief Narrative: Patient is a 58 year old female with history of hypothyroidism, hypertension who presented to the emergency room with complaints of chest pain.  Chest pain improved with nitroglycerin.  EKG showed ST depressions.  High-sensitivity troponins negative.  Cardiology consulted and plan for cardiac cath. Assessment & Plan:   Principal Problem:   Chest pain Active Problems:   Essential hypertension   Hypothyroidism   Hyperglycemia    angina:  -substernal chest pressure  with exertion.  Resolved with rest.   - High-sensitivity troponins have been negative.   -Chest x-ray unremarkable. She had cardiac cath 2 years ago which showed minimal coronary artery disease.   CT angio showed calcified coronary artery atherosclerosis.Started on aspirin. Planning for cardiac cath today-- home if negative or overnight if PCI  Hypertension: On Benicar- hydrochlorothiazide at home.  Currently blood pressure stable  Hyperlipidemia: LDL of 148.  Started on Lipitor  Obesity Estimated body mass index is 37.41 kg/m as calculated from the following:   Height as of this encounter: 5\' 1"  (1.549 m).   Weight as of this encounter: 89.8 kg.   COVID 19 negative x 2  DVT prophylaxis: Lovenox Code Status: Full Family Communication: None present at the bedside Disposition Plan: pending heart cath   Consultants: Cardiology   Subjective: For heart cath today  Objective: Vitals:   07/11/18 2037 07/12/18 0025 07/12/18 0500 07/12/18 0828  BP: (!) 158/88 133/79 120/89 (!) 143/92  Pulse: 82 93 80 87  Resp: 18 18 18 18   Temp: 98.5 F (36.9 C) 98.3 F (36.8 C) 97.9 F (36.6 C) 98.7 F (37.1 C)  TempSrc: Oral Oral Oral Oral  SpO2: 98% 97% 97% 98%  Weight:      Height:        Intake/Output Summary (Last 24 hours) at 07/12/2018 1202 Last data filed at 07/11/2018 2300 Gross per  24 hour  Intake 240 ml  Output --  Net 240 ml   Filed Weights   07/11/18 0748  Weight: 89.8 kg    Examination:  NAD, laying in bed rrr Clear/no wheezing  +BS, soft Min LE edema    Data Reviewed: I have personally reviewed following labs and imaging studies  CBC: Recent Labs  Lab 07/10/18 1222  WBC 9.5  HGB 12.5  HCT 38.8  MCV 92.6  PLT 314   Basic Metabolic Panel: Recent Labs  Lab 07/10/18 1222  NA 137  K 3.9  CL 101  CO2 24  GLUCOSE 117*  BUN 14  CREATININE 0.93  CALCIUM 9.6   GFR: Estimated Creatinine Clearance: 67.2 mL/min (by C-G formula based on SCr of 0.93 mg/dL). Liver Function Tests: No results for input(s): AST, ALT, ALKPHOS, BILITOT, PROT, ALBUMIN in the last 168 hours. No results for input(s): LIPASE, AMYLASE in the last 168 hours. No results for input(s): AMMONIA in the last 168 hours. Coagulation Profile: No results for input(s): INR, PROTIME in the last 168 hours. Cardiac Enzymes: No results for input(s): CKTOTAL, CKMB, CKMBINDEX, TROPONINI in the last 168 hours. BNP (last 3 results) No results for input(s): PROBNP in the last 8760 hours. HbA1C: No results for input(s): HGBA1C in the last 72 hours. CBG: No results for input(s): GLUCAP in the last 168 hours. Lipid Profile: Recent Labs    07/11/18 0612  CHOL 217*  HDL 51  LDLCALC 148*  TRIG 92  CHOLHDL 4.3  Thyroid Function Tests: No results for input(s): TSH, T4TOTAL, FREET4, T3FREE, THYROIDAB in the last 72 hours. Anemia Panel: No results for input(s): VITAMINB12, FOLATE, FERRITIN, TIBC, IRON, RETICCTPCT in the last 72 hours. Sepsis Labs: No results for input(s): PROCALCITON, LATICACIDVEN in the last 168 hours.  Recent Results (from the past 240 hour(s))  Novel Coronavirus,NAA,(SEND-OUT TO REF LAB - TAT 24-48 hrs); Hosp Order     Status: None   Collection Time: 07/10/18  6:46 PM   Specimen: Nasopharyngeal Swab; Respiratory  Result Value Ref Range Status   SARS-CoV-2,  NAA NOT DETECTED NOT DETECTED Final    Comment: (NOTE) This test was developed and its performance characteristics determined by [employer]. This test has not been FDA cleared or approved. This test has been authorized by FDA under an Emergency Use Authorization (EUA). This test is only authorized for the duration of time the declaration that circumstances exist justifying the authorization of the emergency use of in vitro diagnostic tests for detection of SARS-CoV-2 virus and/or diagnosis of COVID-19 infection under section 564(b)(1) of the Act, 21 U.S.C. 338SNK-5(L)(9), unless the authorization is terminated or revoked sooner. When diagnostic testing is negative, the possibility of a false negative result should be considered in the context of a patient's recent exposures and the presence of clinical signs and symptoms consistent with COVID-19. An individual without symptoms of COVID-19 and who is not shedding SARS-CoV-2 virus would expect to have a negative (not detected) result in this assay. Performed  At: [facility] [address] [place], [place] [id number] Rush Farmer MD TK:[id number]    [place]  Final    Comment: Performed at [facility], [place] [address], [place],  [address]  SARS Coronavirus 2 (CEPHEID - Performed in [place] [facility] lab), Hosp Order     Status: None   Collection Time: 07/12/18  9:20 AM   Specimen: Nasopharyngeal Swab  Result Value Ref Range Status   SARS Coronavirus 2 NEGATIVE NEGATIVE Final    Comment: (NOTE) If result is NEGATIVE SARS-CoV-2 target nucleic acids are NOT DETECTED. The SARS-CoV-2 RNA is generally detectable in upper and lower  respiratory specimens during the acute phase of infection. The lowest  concentration of SARS-CoV-2 viral copies this assay can detect is 250  copies / mL. A negative result does not preclude SARS-CoV-2 infection  and should not be used as the sole  basis for treatment or other  patient management decisions.  A negative result may occur with  improper specimen collection / handling, submission of specimen other  than nasopharyngeal swab, presence of viral mutation(s) within the  areas targeted by this assay, and inadequate number of viral copies  (<250 copies / mL). A negative result must be combined with clinical  observations, patient history, and epidemiological information. If result is POSITIVE SARS-CoV-2 target nucleic acids are DETECTED. The SARS-CoV-2 RNA is generally detectable in upper and lower  respiratory specimens dur ing the acute phase of infection.  Positive  results are indicative of active infection with SARS-CoV-2.  Clinical  correlation with patient history and other diagnostic information is  necessary to determine patient infection status.  Positive results do  not rule out bacterial infection or co-infection with other viruses. If result is PRESUMPTIVE POSTIVE SARS-CoV-2 nucleic acids MAY BE PRESENT.   A presumptive positive result was obtained on the submitted specimen  and confirmed on repeat testing.  While 2019 novel coronavirus  (SARS-CoV-2) nucleic acids may be present in the  submitted sample  additional confirmatory testing may be necessary for epidemiological  and / or clinical management purposes  to differentiate between  SARS-CoV-2 and other Sarbecovirus currently known to infect humans.  If clinically indicated additional testing with an alternate test  methodology [phone] is advised. The SARS-CoV-2 RNA is generally  detectable in upper and lower respiratory sp ecimens during the acute  phase of infection. The expected result is Negative. Fact Sheet for Patients:  BoilerBrush.com.cyhttps://www.fda.gov/media/136312/download Fact Sheet for Healthcare Providers: https://pope.com/https://www.fda.gov/media/136313/download This test is not yet approved or cleared by the [place] FDA and has been authorized for detection  and/or diagnosis of SARS-CoV-2 by FDA under an Emergency Use Authorization (EUA).  This EUA will remain in effect (meaning this test can be used) for the duration of the COVID-19 declaration under Section 564(b)(1) of the Act, 21 U.S.C. section 360bbb-3(b)(1), unless the authorization is terminated or revoked sooner. Performed at [facility], [address] [place], [place] [id number]          Radiology Studies: Dg Chest 2 View  Result Date: 07/10/2018 CLINICAL DATA:  Chest pain. EXAM: CHEST - 2 VIEW COMPARISON:  Radiographs of December 22, 2016. FINDINGS: The heart size and mediastinal contours are within normal limits. Both lungs are clear. No pneumothorax or pleural effusion is noted. The visualized skeletal structures are unremarkable. IMPRESSION: No active cardiopulmonary disease. Electronically Signed   By: Lupita RaiderJames  Green Jr M.D.   On: 07/10/2018 13:08   Ct Angio Chest Aorta W/cm &/or Wo/cm  Result Date: 07/10/2018 CLINICAL DATA:  58 year old female with central chest, epigastric pain since last night, progressive today. EXAM: CT ANGIOGRAPHY CHEST WITH CONTRAST TECHNIQUE: Multidetector CT imaging of the chest was performed using the standard protocol during bolus administration of intravenous contrast. Multiplanar CT image reconstructions and MIPs were obtained to evaluate the vascular anatomy. CONTRAST:  100mL OMNIPAQUE IOHEXOL 350 MG/ML SOLN COMPARISON:  Chest radiographs earlier today. FINDINGS: Cardiovascular: Mild Calcified aortic atherosclerosis. There is calcified coronary artery atherosclerosis identified on series 4, image 24. No cardiomegaly. No pericardial effusion. Negative for thoracic aortic aneurysm or dissection. Negative visible upper abdominal aorta. Three vessel arch configuration with negative proximal great vessels. Mediastinum/Nodes: Negative. No lymphadenopathy. Lungs/Pleura: Major airways are patent. No pleural effusion. Minimal atelectasis in the right  costophrenic angle, otherwise negative lungs. Upper Abdomen: Possible hepatic steatosis but otherwise negative visible liver, gallbladder, spleen, pancreas, adrenal glands, kidneys, and bowel in the upper abdomen. Musculoskeletal: Disc and endplate degeneration in the thoracic spine with occasional vacuum disc. No acute osseous abnormality identified. Review of the MIP images confirms the above findings. IMPRESSION: 1. Negative for thoracic aortic aneurysm or dissection. Positive for calcified coronary artery atherosclerosis, minimal aortic atherosclerosis. 2. No acute findings in the chest. Electronically Signed   By: Odessa FlemingH  Hall M.D.   On: 07/10/2018 17:54        Scheduled Meds:  aspirin EC  81 mg Oral Daily   atorvastatin  40 mg Oral q1800   enoxaparin (LOVENOX) injection  40 mg Subcutaneous Q24H   gabapentin  300 mg Oral QHS   irbesartan  300 mg Oral Daily   And   hydrochlorothiazide  25 mg Oral Daily   loratadine  10 mg Oral Daily   sodium chloride flush  3 mL Intravenous Q12H   Continuous Infusions:  sodium chloride     sodium chloride       LOS: 1 day        Joseph ArtJessica U Ladean Steinmeyer, DO  [facility]  If 7PM-7AM, please contact night-coverage [email or web address] Password [email or web address] 07/12/2018, 12:02 PM

## 2018-07-12 NOTE — Progress Notes (Signed)
Progress Note  Patient Name: [name] Date of Encounter: 07/12/2018  Primary Cardiologist: Chilton Siiffany Meeteetse, MD   Subjective   Pt has had more chest pain / tightness.   Occurred when she got up to go to the bathroom Pressure like ,  Relieved with SL NTG .  ECG continues to show NS abn Troponin is negative  Covid test was not resulted.   Has been repeats.  No cp overnight   Inpatient Medications    Scheduled Meds:  aspirin EC  81 mg Oral Daily   atorvastatin  40 mg Oral q1800   enoxaparin (LOVENOX) injection  40 mg Subcutaneous Q24H   gabapentin  300 mg Oral QHS   irbesartan  300 mg Oral Daily   And   hydrochlorothiazide  25 mg Oral Daily   loratadine  10 mg Oral Daily   sodium chloride flush  3 mL Intravenous Q12H   Continuous Infusions:  sodium chloride     sodium chloride     PRN Meds: sodium chloride, acetaminophen, cyclobenzaprine, nitroGLYCERIN, ondansetron (ZOFRAN) IV, sodium chloride flush   Vital Signs    Vitals:   07/11/18 2037 07/12/18 0025 07/12/18 0500 07/12/18 0828  BP: (!) 158/88 133/79 120/89 (!) 143/92  Pulse: 82 93 80 87  Resp: 18 18 18 18   Temp: 98.5 F (36.9 C) 98.3 F (36.8 C) 97.9 F (36.6 C) 98.7 F (37.1 C)  TempSrc: Oral Oral Oral Oral  SpO2: 98% 97% 97% 98%  Weight:      Height:        Intake/Output Summary (Last 24 hours) at 07/12/2018 0932 Last data filed at 07/11/2018 2300 Gross per 24 hour  Intake 240 ml  Output 550 ml  Net -310 ml   Last 3 Weights 07/11/2018 03/14/2018 05/16/2017  Weight (lbs) 198 lb 196 lb 12.8 oz 194 lb  Weight (kg) 89.812 kg 89.268 kg 87.998 kg      Telemetry    NSR - Personally Reviewed  ECG       - Personally Reviewed  Physical Exam   Physical Exam: Blood pressure (!) 143/92, pulse 87, temperature 98.7 F (37.1 C), temperature source Oral, resp. rate 18, height 5\' 1"  (1.549 m), weight 89.8 kg, last menstrual period 06/03/2011, SpO2 98 %.  GEN:  Middle age, moderately obese  female,  NAD  HEENT: Normal NECK: No JVD; No carotid bruits LYMPHATICS: No lymphadenopathy CARDIAC: RRR   RESPIRATORY:  Clear to auscultation without rales, wheezing or rhonchi  ABDOMEN: Soft, non-tender, non-distended MUSCULOSKELETAL:  No edema; No deformity  SKIN: Warm and dry NEUROLOGIC:  Alert and oriented x 3    Labs    High Sensitivity Troponin:   Recent Labs  Lab 07/10/18 1222 07/10/18 1537  TROPONINIHS 3 13      Cardiac EnzymesNo results for input(s): TROPONINI in the last 168 hours. No results for input(s): TROPIPOC in the last 168 hours.   Chemistry Recent Labs  Lab 07/10/18 1222  NA 137  K 3.9  CL 101  CO2 24  GLUCOSE 117*  BUN 14  CREATININE 0.93  CALCIUM 9.6  GFRNONAA >60  GFRAA >60  ANIONGAP 12     Hematology Recent Labs  Lab 07/10/18 1222  WBC 9.5  RBC 4.19  HGB 12.5  HCT 38.8  MCV 92.6  MCH 29.8  MCHC 32.2  RDW 14.3  PLT 314    BNPNo results for input(s): BNP, PROBNP in the last 168 hours.   DDimer No  results for input(s): DDIMER in the last 168 hours.   Radiology    Dg Chest 2 View  Result Date: 07/10/2018 CLINICAL DATA:  Chest pain. EXAM: CHEST - 2 VIEW COMPARISON:  Radiographs of December 22, 2016. FINDINGS: The heart size and mediastinal contours are within normal limits. Both lungs are clear. No pneumothorax or pleural effusion is noted. The visualized skeletal structures are unremarkable. IMPRESSION: No active cardiopulmonary disease. Electronically Signed   By: Marijo Conception M.D.   On: 07/10/2018 13:08   Ct Angio Chest Aorta W/cm &/or Wo/cm  Result Date: 07/10/2018 CLINICAL DATA:  58 year old female with central chest, epigastric pain since last night, progressive today. EXAM: CT ANGIOGRAPHY CHEST WITH CONTRAST TECHNIQUE: Multidetector CT imaging of the chest was performed using the standard protocol during bolus administration of intravenous contrast. Multiplanar CT image reconstructions and MIPs were obtained to evaluate  the vascular anatomy. CONTRAST:  165mL OMNIPAQUE IOHEXOL 350 MG/ML SOLN COMPARISON:  Chest radiographs earlier today. FINDINGS: Cardiovascular: Mild Calcified aortic atherosclerosis. There is calcified coronary artery atherosclerosis identified on series 4, image 24. No cardiomegaly. No pericardial effusion. Negative for thoracic aortic aneurysm or dissection. Negative visible upper abdominal aorta. Three vessel arch configuration with negative proximal great vessels. Mediastinum/Nodes: Negative. No lymphadenopathy. Lungs/Pleura: Major airways are patent. No pleural effusion. Minimal atelectasis in the right costophrenic angle, otherwise negative lungs. Upper Abdomen: Possible hepatic steatosis but otherwise negative visible liver, gallbladder, spleen, pancreas, adrenal glands, kidneys, and bowel in the upper abdomen. Musculoskeletal: Disc and endplate degeneration in the thoracic spine with occasional vacuum disc. No acute osseous abnormality identified. Review of the MIP images confirms the above findings. IMPRESSION: 1. Negative for thoracic aortic aneurysm or dissection. Positive for calcified coronary artery atherosclerosis, minimal aortic atherosclerosis. 2. No acute findings in the chest. Electronically Signed   By: Genevie Ann M.D.   On: 07/10/2018 17:54    Cardiac Studies     Patient Profile     58 y.o. female admitted with symptoms worrisome for angina .     Assessment & Plan    1.   Chest pressure.   Worrisome.   Cath in 2018 showed very minimal CAD ( prox LAD 20-30%)    For cath today .   If her cath is unremarkable, she will be able to go home today  If she has PCI, she will be observed overnight        For questions or updates, please contact [place] Please consult [email or web address] for contact info under        Signed, Mertie Moores, MD  07/12/2018, 9:32 AM

## 2018-07-12 NOTE — Interval H&P Note (Signed)
History and Physical Interval Note:  07/12/2018 1:10 PM  [name]  has presented today for surgery, with the diagnosis of chest pain - UNSTABLE ANGINA.  The various methods of treatment have been discussed with the patient and family. After consideration of risks, benefits and other options for treatment, the patient has consented to  Procedure(s): LEFT HEART CATH AND CORONARY ANGIOGRAPHY (N/A)  with possible PERCUTANEOUS CORONARY INTERVENTION as a surgical intervention.  The patient's history has been reviewed, patient examined, no change in status, stable for surgery.  I have reviewed the patient's chart and labs.  Questions were answered to the patient's satisfaction.    Cath Lab Visit (complete for each Cath Lab visit)  Clinical Evaluation Leading to the Procedure:   ACS: No.  Non-ACS:    Anginal Classification: CCS III  Anti-ischemic medical therapy: Minimal Therapy (1 class of medications)  Non-Invasive Test Results: No non-invasive testing performed  Prior CABG: No previous CABG         Glenetta Hew

## 2018-07-12 NOTE — Progress Notes (Signed)
Patient was scheduled for heart cath today at 8:30.  Covid test is still pending.  Specimen collected 6/29 around 6pm.  Called [facility], they state specimen is still in progress, they didn't receive specimen until 6/30 around 5pm.  Spoke to cathing doctor, we are going to do a rapid test so her cath can be done.  Informed the nurse.

## 2018-07-12 NOTE — Progress Notes (Signed)
Called cath lab at 1800 to assess patient's right upper extremity which appeared very swollen compared to baseline. Extremity was soft and patient did not complain of pain and stated she had full sensation to extremity. This RN removed coban wrap that was very tight around patient's right AC area. TR band on right wrist had not been deflated at this time and had 9 cc air. Cath lab assessed arm and spoke with Dr. Ellyn Hack via phone. Instructions given to this RN to start removing air from TR band and to monitor swelling to see if it would decrease since coban wrap around [facility] was removed. If arm did not improve, manual BP cuff could be applied for pressure per orders from Dr. Ellyn Hack. Patient's right upper extremity currently still swollen but has decreased from prior; arm is still elevated and report given at bedside to [facility] [place] who will monitor closely.

## 2018-07-12 NOTE — H&P (View-Only) (Signed)
° °Progress Note ° °Patient Name: [name] °Date of Encounter: 07/12/2018 ° °Primary Cardiologist: Tiffany Gurabo, MD  ° °Subjective  ° °Pt has had more chest pain / tightness.   Occurred when she got up to go to the bathroom °Pressure like ,  Relieved with SL NTG .  °ECG continues to show NS abn °Troponin is negative  °Covid test was not resulted.   Has been repeats.  °No cp overnight  ° °Inpatient Medications  °  °Scheduled Meds: °• aspirin EC  81 mg Oral Daily  °• atorvastatin  40 mg Oral q1800  °• enoxaparin (LOVENOX) injection  40 mg Subcutaneous Q24H  °• gabapentin  300 mg Oral QHS  °• irbesartan  300 mg Oral Daily  ° And  °• hydrochlorothiazide  25 mg Oral Daily  °• loratadine  10 mg Oral Daily  °• sodium chloride flush  3 mL Intravenous Q12H  ° °Continuous Infusions: °• sodium chloride    °• sodium chloride    ° °PRN Meds: °sodium chloride, acetaminophen, cyclobenzaprine, nitroGLYCERIN, ondansetron (ZOFRAN) IV, sodium chloride flush  ° °Vital Signs  °  °Vitals:  ° 07/11/18 2037 07/12/18 0025 07/12/18 0500 07/12/18 0828  °BP: (!) 158/88 133/79 120/89 (!) 143/92  °Pulse: 82 93 80 87  °Resp: 18 18 18 18  °Temp: 98.5 °F (36.9 °C) 98.3 °F (36.8 °C) 97.9 °F (36.6 °C) 98.7 °F (37.1 °C)  °TempSrc: Oral Oral Oral Oral  °SpO2: 98% 97% 97% 98%  °Weight:      °Height:      ° ° °Intake/Output Summary (Last 24 hours) at 07/12/2018 0932 °Last data filed at 07/11/2018 2300 °Gross per 24 hour  °Intake 240 ml  °Output 550 ml  °Net -310 ml  ° °Last 3 Weights 07/11/2018 03/14/2018 05/16/2017  °Weight (lbs) 198 lb 196 lb 12.8 oz 194 lb  °Weight (kg) 89.812 kg 89.268 kg 87.998 kg  °   ° °Telemetry  °  °NSR - Personally Reviewed ° °ECG  °  °   - Personally Reviewed ° °Physical Exam  ° °Physical Exam: °Blood pressure (!) 143/92, pulse 87, temperature 98.7 °F (37.1 °C), temperature source Oral, resp. rate 18, height 5' 1" (1.549 m), weight 89.8 kg, last menstrual period 06/03/2011, SpO2 98 %. ° °GEN:  Middle age, moderately obese  female,  NAD  °HEENT: Normal °NECK: No JVD; No carotid bruits °LYMPHATICS: No lymphadenopathy °CARDIAC: RRR   °RESPIRATORY:  Clear to auscultation without rales, wheezing or rhonchi  °ABDOMEN: Soft, non-tender, non-distended °MUSCULOSKELETAL:  No edema; No deformity  °SKIN: Warm and dry °NEUROLOGIC:  Alert and oriented x 3 °  ° °Labs  °  °High Sensitivity Troponin:   °Recent Labs  °Lab 07/10/18 °1222 07/10/18 °1537  °TROPONINIHS 3 13  °   ° °Cardiac EnzymesNo results for input(s): TROPONINI in the last 168 hours. No results for input(s): TROPIPOC in the last 168 hours.  ° °Chemistry °Recent Labs  °Lab 07/10/18 °1222  °NA 137  °K 3.9  °CL 101  °CO2 24  °GLUCOSE 117*  °BUN 14  °CREATININE 0.93  °CALCIUM 9.6  °GFRNONAA >60  °GFRAA >60  °ANIONGAP 12  °  ° °Hematology °Recent Labs  °Lab 07/10/18 °1222  °WBC 9.5  °RBC 4.19  °HGB 12.5  °HCT 38.8  °MCV 92.6  °MCH 29.8  °MCHC 32.2  °RDW 14.3  °PLT 314  ° ° °BNPNo results for input(s): BNP, PROBNP in the last 168 hours.  ° °DDimer No   results for input(s): DDIMER in the last 168 hours.   Radiology    Dg Chest 2 View  Result Date: 07/10/2018 CLINICAL DATA:  Chest pain. EXAM: CHEST - 2 VIEW COMPARISON:  Radiographs of December 22, 2016. FINDINGS: The heart size and mediastinal contours are within normal limits. Both lungs are clear. No pneumothorax or pleural effusion is noted. The visualized skeletal structures are unremarkable. IMPRESSION: No active cardiopulmonary disease. Electronically Signed   By: Marijo Conception M.D.   On: 07/10/2018 13:08   Ct Angio Chest Aorta W/cm &/or Wo/cm  Result Date: 07/10/2018 CLINICAL DATA:  58 year old female with central chest, epigastric pain since last night, progressive today. EXAM: CT ANGIOGRAPHY CHEST WITH CONTRAST TECHNIQUE: Multidetector CT imaging of the chest was performed using the standard protocol during bolus administration of intravenous contrast. Multiplanar CT image reconstructions and MIPs were obtained to evaluate  the vascular anatomy. CONTRAST:  165mL OMNIPAQUE IOHEXOL 350 MG/ML SOLN COMPARISON:  Chest radiographs earlier today. FINDINGS: Cardiovascular: Mild Calcified aortic atherosclerosis. There is calcified coronary artery atherosclerosis identified on series 4, image 24. No cardiomegaly. No pericardial effusion. Negative for thoracic aortic aneurysm or dissection. Negative visible upper abdominal aorta. Three vessel arch configuration with negative proximal great vessels. Mediastinum/Nodes: Negative. No lymphadenopathy. Lungs/Pleura: Major airways are patent. No pleural effusion. Minimal atelectasis in the right costophrenic angle, otherwise negative lungs. Upper Abdomen: Possible hepatic steatosis but otherwise negative visible liver, gallbladder, spleen, pancreas, adrenal glands, kidneys, and bowel in the upper abdomen. Musculoskeletal: Disc and endplate degeneration in the thoracic spine with occasional vacuum disc. No acute osseous abnormality identified. Review of the MIP images confirms the above findings. IMPRESSION: 1. Negative for thoracic aortic aneurysm or dissection. Positive for calcified coronary artery atherosclerosis, minimal aortic atherosclerosis. 2. No acute findings in the chest. Electronically Signed   By: Genevie Ann M.D.   On: 07/10/2018 17:54    Cardiac Studies     Patient Profile     58 y.o. female admitted with symptoms worrisome for angina .     Assessment & Plan    1.   Chest pressure.   Worrisome.   Cath in 2018 showed very minimal CAD ( prox LAD 20-30%)    For cath today .   If her cath is unremarkable, she will be able to go home today  If she has PCI, she will be observed overnight        For questions or updates, please contact [place] Please consult [email or web address] for contact info under        Signed, Mertie Moores, MD  07/12/2018, 9:32 AM

## 2018-07-13 ENCOUNTER — Encounter (HOSPITAL_COMMUNITY): Payer: Self-pay | Admitting: Cardiology

## 2018-07-13 ENCOUNTER — Telehealth: Payer: Self-pay

## 2018-07-13 DIAGNOSIS — Z955 Presence of coronary angioplasty implant and graft: Secondary | ICD-10-CM

## 2018-07-13 LAB — BASIC METABOLIC PANEL
Anion gap: 9 (ref 5–15)
BUN: 14 mg/dL (ref 6–20)
CO2: 27 mmol/L (ref 22–32)
Calcium: 9.3 mg/dL (ref 8.9–10.3)
Chloride: 103 mmol/L (ref 98–111)
Creatinine, Ser: 0.94 mg/dL (ref 0.44–1.00)
GFR calc Af Amer: >60 mL/min (ref >60)
GFR calc non Af Amer: >60 mL/min (ref >60)
Glucose, Bld: 106 mg/dL — ABNORMAL HIGH (ref 70–99)
Potassium: 3.7 mmol/L (ref 3.5–5.1)
Sodium: 139 mmol/L (ref 135–145)

## 2018-07-13 LAB — CBC
HCT: 35.7 % — ABNORMAL LOW (ref 36.0–46.0)
Hemoglobin: 11.4 g/dL — ABNORMAL LOW (ref 12.0–15.0)
MCH: 29 pg (ref 26.0–34.0)
MCHC: 31.9 g/dL (ref 30.0–36.0)
MCV: 90.8 fL (ref 80.0–100.0)
Platelets: 297 10*3/uL (ref 150–400)
RBC: 3.93 MIL/uL (ref 3.87–5.11)
RDW: 13.8 % (ref 11.5–15.5)
WBC: 9.5 10*3/uL (ref 4.0–10.5)
nRBC: 0 % (ref 0.0–0.2)

## 2018-07-13 MED ORDER — CLOPIDOGREL BISULFATE 75 MG PO TABS: 75.0000 mg | ORAL_TABLET | Freq: Every day | ORAL | 1 refills | Status: DC

## 2018-07-13 MED ORDER — ATORVASTATIN CALCIUM 80 MG PO TABS: 80.0000 mg | ORAL_TABLET | Freq: Every day | ORAL | 1 refills | Status: DC

## 2018-07-13 MED ORDER — CARVEDILOL 6.25 MG PO TABS: 6.2500 mg | ORAL_TABLET | Freq: Two times a day (BID) | ORAL | 1 refills | Status: DC

## 2018-07-13 MED ORDER — NITROGLYCERIN 0.4 MG SL SUBL: 0.4000 mg | SUBLINGUAL_TABLET | SUBLINGUAL | 1 refills | Status: DC | PRN

## 2018-07-13 MED ORDER — ASPIRIN 81 MG PO TBEC: 81.0000 mg | DELAYED_RELEASE_TABLET | Freq: Every day | ORAL | Status: DC

## 2018-07-13 MED ORDER — CARVEDILOL 6.25 MG PO TABS
6.2500 mg | ORAL_TABLET | Freq: Two times a day (BID) | ORAL | Status: DC
  Administered 2018-07-13: 6.25 mg via ORAL
  Filled 2018-07-13: qty 1

## 2018-07-13 MED ORDER — ATORVASTATIN CALCIUM 80 MG PO TABS: 80.0000 mg | ORAL_TABLET | Freq: Every day | ORAL | Status: DC

## 2018-07-13 NOTE — Telephone Encounter (Signed)
Called to do hospital follow up and there was no answer from pt and unable to leave voicemail

## 2018-07-13 NOTE — Progress Notes (Signed)
Rt.arm stll noted to be slightly hard when touched.MD on call was called & Dr.Wosik came to see pt.& ordered to continue to monitor the arm.

## 2018-07-13 NOTE — Discharge Summary (Addendum)
Physician Discharge Summary  Rebekah HeadlandBlanche R Wagner ZOX:[id number] DOB: 05/08/1960 DOA: 07/10/2018  PCP: Dorothyann PengSanders, Robyn, MD  Admit date: 07/10/2018 Discharge date: 07/13/2018  Admitted From: home Discharge disposition: home   Recommendations for Outpatient Follow-Up:   1. LFTS and FLP in 6 weeks   Discharge Diagnosis:   Principal Problem:   Chest pain Active Problems:   Essential hypertension   Hypothyroidism   Hyperglycemia   Unstable angina [facility](HCC)    Discharge Condition: Improved.  Diet recommendation: Low sodium, heart healthy  Wound care: None.  Code status: Full.   History of Present Illness:  [name] is a 58 y.o. female with medical history significant of hypothyroidism; HTN; and atypical chest pain (negative cath in 7/18) presenting chest pain.  She woke up overnight with chest pressure.  She rolled over and drank some water with a little improvement.  This AM, she noticed recurrence of the pressure while going to fix lunch.  It seemed to get tighter and tighter.  Substernal pressure that was worse with exertion.  It seemed to resolve spontaneously.  She just got up to use the bathroom and had similar recurrence of pain; it seems to be improving again now with rest.  She had a negative cath in 2018 after her PCP noticed unusual changes on her EKG; she was referred to cards, had a false positive stress test, and had clear coronaries on cath.  She notes recent significant stress with the death of her son's friend.  She has also been taking a lot of cyclobenzaprine and Neurontin for leg and back pain, when she had not been taking.  Her BP was markedly unusual on presentation, improved while in the ER.   Hospital Course by Problem:   [facility]stable [facility]: -Cath from 2018 with very minimal ACD (pLAD 20-30%) -Underwent LHC 07/12/2018 that [facility] with successful [facility]/PCI and otherwise mild disease in the LAD and RCA  -Normal LVEF and low EDP -Recommendations for aggressive risk factor modification with ASA, Plavix, statin   2. HTN: -add Carvedilol 6.25mg     3. HLD: -LDL elevated at 148 with a goal of 70mg /dl  -Increase statin to 80mg  -outpatient Lipid panel anf LFTs in 6-8 weeks   4. Obesity Estimated body mass index is 40.2 kg/m as calculated from the following:   Height as of this encounter: 5\' 1"  (1.549 m).   Weight as of this encounter: 96.5 kg.   Medical Consultants:    cardiology  Discharge Exam:   Vitals:   07/12/18 2230 07/13/18 0632  BP: (!) 148/91 130/88  Pulse: 93 77  Resp: 20 (!) 21  Temp:  97.8 F (36.6 C)  SpO2: 97% 99%   Vitals:   07/12/18 2000 07/12/18 2050 07/12/18 2230 07/13/18 0632  BP:  (!) 151/95 (!) 148/91 130/88  Pulse: 87 97 93 77  Resp: 16 16 20  (!) 21  Temp:  98.7 F (37.1 C)  97.8 F (36.6 C)  TempSrc:  Oral  Oral  SpO2: 99% 98% 97% 99%  Weight:    96.5 kg  Height:        General exam: Appears calm and comfortable  The results of significant diagnostics from this hospitalization (including imaging, microbiology, ancillary and laboratory) are listed below for reference.     Procedures and Diagnostic Studies:   Dg Chest 2 View  Result Date: 07/10/2018 CLINICAL DATA:  Chest pain. EXAM: CHEST - 2  VIEW COMPARISON:  Radiographs of December 22, 2016. FINDINGS: The heart size and mediastinal contours are within normal limits. Both lungs are clear. No pneumothorax or pleural effusion is noted. The visualized skeletal structures are unremarkable. IMPRESSION: No active cardiopulmonary disease. Electronically Signed   By: Lupita RaiderJames  Green Jr M.D.   On: 07/10/2018 13:08   Ct Angio Chest Aorta W/cm &/or Wo/cm  Result Date: 07/10/2018 CLINICAL DATA:  58 year old female with central chest, epigastric pain since last night, progressive today. EXAM: CT ANGIOGRAPHY CHEST WITH CONTRAST TECHNIQUE: Multidetector CT imaging of the chest was performed using the  standard protocol during bolus administration of intravenous contrast. Multiplanar CT image reconstructions and MIPs were obtained to evaluate the vascular anatomy. CONTRAST:  100mL OMNIPAQUE IOHEXOL 350 MG/ML SOLN COMPARISON:  Chest radiographs earlier today. FINDINGS: Cardiovascular: Mild Calcified aortic atherosclerosis. There is calcified coronary artery atherosclerosis identified on series 4, image 24. No cardiomegaly. No pericardial effusion. Negative for thoracic aortic aneurysm or dissection. Negative visible upper abdominal aorta. Three vessel arch configuration with negative proximal great vessels. Mediastinum/Nodes: Negative. No lymphadenopathy. Lungs/Pleura: Major airways are patent. No pleural effusion. Minimal atelectasis in the right costophrenic angle, otherwise negative lungs. Upper Abdomen: Possible hepatic steatosis but otherwise negative visible liver, gallbladder, spleen, pancreas, adrenal glands, kidneys, and bowel in the upper abdomen. Musculoskeletal: Disc and endplate degeneration in the thoracic spine with occasional vacuum disc. No acute osseous abnormality identified. Review of the MIP images confirms the above findings. IMPRESSION: 1. Negative for thoracic aortic aneurysm or dissection. Positive for calcified coronary artery atherosclerosis, minimal aortic atherosclerosis. 2. No acute findings in the chest. Electronically Signed   By: Odessa FlemingH  Hall M.D.   On: 07/10/2018 17:54     Labs:   Basic Metabolic Panel: Recent Labs  Lab 07/10/18 1222 07/13/18 0341  NA 137 139  K 3.9 3.7  CL 101 103  CO2 24 27  GLUCOSE 117* 106*  BUN 14 14  CREATININE 0.93 0.94  CALCIUM 9.6 9.3   GFR Estimated Creatinine Clearance: 69.3 mL/min (by C-G formula based on SCr of 0.94 mg/dL). Liver Function Tests: No results for input(s): AST, ALT, ALKPHOS, BILITOT, PROT, ALBUMIN in the last 168 hours. No results for input(s): LIPASE, AMYLASE in the last 168 hours. No results for input(s): AMMONIA in  the last 168 hours. Coagulation profile No results for input(s): INR, PROTIME in the last 168 hours.  CBC: Recent Labs  Lab 07/10/18 1222 07/13/18 0341  WBC 9.5 9.5  HGB 12.5 11.4*  HCT 38.8 35.7*  MCV 92.6 90.8  PLT 314 297   Cardiac Enzymes: No results for input(s): CKTOTAL, CKMB, CKMBINDEX, TROPONINI in the last 168 hours. BNP: Invalid input(s): POCBNP CBG: No results for input(s): GLUCAP in the last 168 hours. D-Dimer No results for input(s): DDIMER in the last 72 hours. Hgb A1c No results for input(s): HGBA1C in the last 72 hours. Lipid Profile Recent Labs    07/11/18 0612  CHOL 217*  HDL 51  LDLCALC 148*  TRIG 92  CHOLHDL 4.3   Thyroid function studies No results for input(s): TSH, T4TOTAL, T3FREE, THYROIDAB in the last 72 hours.  Invalid input(s): FREET3 Anemia work up No results for input(s): VITAMINB12, FOLATE, FERRITIN, TIBC, IRON, RETICCTPCT in the last 72 hours. Microbiology Recent Results (from the past 240 hour(s))  Novel Coronavirus,NAA,(SEND-OUT TO REF LAB - TAT 24-48 hrs); Hosp Order     Status: None   Collection Time: 07/10/18  6:46 PM   Specimen: Nasopharyngeal Swab; Respiratory  Result Value Ref Range Status   SARS-CoV-2, NAA NOT DETECTED NOT DETECTED Final    Comment: (NOTE) This test was developed and its performance characteristics determined by [facility]. This test has not been FDA cleared or approved. This test has been authorized by FDA under an Emergency Use Authorization (EUA). This test is only authorized for the duration of time the declaration that circumstances exist justifying the authorization of the emergency use of in vitro diagnostic tests for detection of SARS-CoV-2 virus and/or diagnosis of COVID-19 infection under section 564(b)(1) of the Act, 21 U.S.C. 469GEX-5(M)(8360bbb-3(b)(1), unless the authorization is terminated or revoked sooner. When diagnostic testing is negative, the possibility of a false negative result  should be considered in the context of a patient's recent exposures and the presence of clinical signs and symptoms consistent with COVID-19. An individual without symptoms of COVID-19 and who is not shedding SARS-CoV-2 virus would expect to have a negative (not detected) result in this assay. Performed  At: [facility] [place] [address] [place], [place] [id number] Jolene SchimkeNagendra Sanjai MD UV:[id number]:[phone]    Coronavirus Source NASOPHARYNGEAL  Final    Comment: Performed at [facility], [address] [place], [place] [address]  SARS Coronavirus 2 (CEPHEID - Performed in [facility] hospital lab), Hosp Order     Status: None   Collection Time: 07/12/18  9:20 AM   Specimen: Nasopharyngeal Swab  Result Value Ref Range Status   SARS Coronavirus 2 NEGATIVE NEGATIVE Final    Comment: (NOTE) If result is NEGATIVE SARS-CoV-2 target nucleic acids are NOT DETECTED. The SARS-CoV-2 RNA is generally detectable in upper and lower  respiratory specimens during the acute phase of infection. The lowest  concentration of SARS-CoV-2 viral copies this assay can detect is 250  copies / mL. A negative result does not preclude SARS-CoV-2 infection  and should not be used as the sole basis for treatment or other  patient management decisions.  A negative result may occur with  improper specimen collection / handling, submission of specimen other  than nasopharyngeal swab, presence of viral mutation(s) within the  areas targeted by this assay, and inadequate number of viral copies  (<250 copies / mL). A negative result must be combined with clinical  observations, patient history, and epidemiological information. If result is POSITIVE SARS-CoV-2 target nucleic acids are DETECTED. The SARS-CoV-2 RNA is generally detectable in upper and lower  respiratory specimens dur ing the acute phase of infection.  Positive  results are indicative of active infection with SARS-CoV-2.  Clinical  correlation with  patient history and other diagnostic information is  necessary to determine patient infection status.  Positive results do  not rule out bacterial infection or co-infection with other viruses. If result is PRESUMPTIVE POSTIVE SARS-CoV-2 nucleic acids MAY BE PRESENT.   A presumptive positive result was obtained on the submitted specimen  and confirmed on repeat testing.  While 2019 novel coronavirus  (SARS-CoV-2) nucleic acids may be present in the submitted sample  additional confirmatory testing may be necessary for epidemiological  and / or clinical management purposes  to differentiate between  SARS-CoV-2 and other Sarbecovirus currently known to infect humans.  If clinically indicated additional testing with an alternate test  methodology [phone]) is advised. The SARS-CoV-2 RNA is generally  detectable in upper and lower respiratory sp ecimens during the acute  phase of infection. The expected result is Negative. Fact Sheet for Patients:  BoilerBrush.com.cyhttps://www.fda.gov/media/136312/download Fact Sheet for Healthcare Providers: https://pope.com/https://www.fda.gov/media/136313/download This test is  not yet approved or cleared by the [place] and has been authorized for detection and/or diagnosis of SARS-CoV-2 by FDA under an Emergency Use Authorization (EUA).  This EUA will remain in effect (meaning this test can be used) for the duration of the COVID-19 declaration under Section 564(b)(1) of the Act, 21 U.S.C. section 360bbb-3(b)(1), unless the authorization is terminated or revoked sooner. Performed at [facility], [place] [address] [place], [place] [address]      Discharge Instructions:   Discharge Instructions    Amb Referral to Cardiac Rehabilitation   Complete by: As directed    Diagnosis: Coronary Stents   After initial evaluation and assessments completed: Virtual Based Care may be provided alone or in conjunction with Phase 2 Cardiac Rehab based on patient barriers.: Yes   Diet  - low sodium heart healthy   Complete by: As directed    Discharge instructions   Complete by: As directed    Keep arm elevated above her heart.   Increase activity slowly   Complete by: As directed      Allergies as of 07/13/2018   No Known Allergies     Medication List    STOP taking these medications   BC Fast Pain Relief 650-195-33.3 MG Pack Generic drug: Aspirin-Salicylamide-Caffeine   gabapentin 300 MG capsule Commonly known as: NEURONTIN     TAKE these medications   aspirin 81 MG EC tablet Take 1 tablet (81 mg total) by mouth daily. Start taking on: July 14, 2018   atorvastatin 80 MG tablet Commonly known as: LIPITOR Take 1 tablet (80 mg total) by mouth daily at 6 PM.   carvedilol 6.25 MG tablet Commonly known as: COREG Take 1 tablet (6.25 mg total) by mouth 2 (two) times daily with a meal.   clopidogrel 75 MG tablet Commonly known as: PLAVIX Take 1 tablet (75 mg total) by mouth daily with breakfast. Start taking on: July 14, 2018   cyclobenzaprine 10 MG tablet Commonly known as: FLEXERIL TAKE 1 TABLET (10 MG TOTAL) BY MOUTH DAILY AS NEEDED FOR MUSCLE SPASMS.   loratadine 10 MG tablet Commonly known as: CLARITIN Take 10 mg by mouth daily.   nitroGLYCERIN 0.4 MG SL tablet Commonly known as: NITROSTAT Place 1 tablet (0.4 mg total) under the tongue every 5 (five) minutes as needed for chest pain.   olmesartan-hydrochlorothiazide 40-25 MG tablet Commonly known as: BENICAR HCT TAKE 1 TABLET BY MOUTH EVERY DAY   VITAMIN D PO Take 1 tablet by mouth daily.      Follow-up Information    Lendon Colonel, NP Follow up on 07/24/2018.   Specialties: Nurse Practitioner, Radiology, Cardiology Why: Your follow up appointment will be on 07/24/2018 at 115pm. Please arrive 15 minutes prior to your appointment time.  Contact information: [address] [place] [address] [phone]            Time coordinating discharge: 25 min   Signed:  Geradine Girt DO  [facility] 07/13/2018, 11:36 AM

## 2018-07-13 NOTE — Telephone Encounter (Signed)
Returned the pt's call.  Unable to leave a message voicemail is full.  The pt was returning a call that was made by [place], RMA and [place] was calling the pt to do a transition  of care call and to schedule the pt an appt for a hospital f/u.

## 2018-07-13 NOTE — Progress Notes (Addendum)
TR BAND REMOVAL  LOCATION:    right radial  DEFLATED PER PROTOCOL:    Yes.    TIME BAND OFF / DRESSING APPLIED:    2030   SITE UPON ARRIVAL:    Level 1  SITE AFTER BAND REMOVAL:    Level 1  CIRCULATION SENSATION AND MOVEMENT:    Within Normal Limits   Yes.    COMMENTS:  Noted still swollen & hard to touch  above the site;with palpable pulses ,warm to touch & no numbness noted   a medium size b/p cuff above the( proximal to )suspected site applied &  inflated X 15 mins,@60  mmhg as per instructions by Dr.Harding .Swelling went down slightly but still tender to touch.Wiil continue to monitor.

## 2018-07-13 NOTE — Progress Notes (Signed)
CARDIAC REHAB PHASE I   PRE:  Rate/Rhythm: 102 ST  BP:  Supine: 147/92  Sitting:   Standing:    SaO2: 99%RA  MODE:  Ambulation: 370 ft   POST:  Rate/Rhythm: 132-140 ST   BP:  Supine:   Sitting: 166/95         Later 121/70  Standing:    SaO2: 99%RA 0945-1055 Pt walked 370 ft on RA with slow steady gait and no CP. Heart rate elevated to 130-140 during walk. To 127 after sitting on side of bed. Reviewed NTG use, importance of plavix with stent, heart healthy food choices, ex ed and CRP 2. Referred to [place] program. After ed, pt stated she needed to lie down as she was feeling lightheaded. BP then 121/70 and HR at 127 ST. Encouraged pt not to get up by herself. Notified cardiology NP and pt's RN. Pt interested in virtual ex program. Pt is interested in participating in Virtual Cardiac Rehab. Pt advised that Virtual Cardiac Rehab is provided at no cost to the patient.  Checklist:  1. Pt has smart device  ie smartphone and/or ipad for downloading an app  Yes 2. Reliable internet/wifi service    Yes 3. Understands how to use their smartphone and navigate within an app.  Yes   Reviewed with pt the scheduling process for virtual cardiac rehab.  Pt verbalized understanding.    Graylon Good, RN BSN  07/13/2018 10:53 AM

## 2018-07-13 NOTE — Progress Notes (Addendum)
Progress Note  Patient Name: [name] Date of Encounter: 07/13/2018  Primary Cardiologist: Skeet Latch, MD   Subjective   Pt feeling well today. No chest pain or SOB with ambulating. Has some right FA tightness>>could consider [place] prior to discharge. Otherwise doing well   Inpatient Medications    Scheduled Meds: . aspirin EC  81 mg Oral Daily  . atorvastatin  40 mg Oral q1800  . clopidogrel  75 mg Oral Q breakfast  . enoxaparin (LOVENOX) injection  40 mg Subcutaneous Q24H  . gabapentin  300 mg Oral QHS  . irbesartan  300 mg Oral Daily   And  . hydrochlorothiazide  25 mg Oral Daily  . loratadine  10 mg Oral Daily  . sodium chloride flush  3 mL Intravenous Q12H   Continuous Infusions: . sodium chloride     PRN Meds: sodium chloride, acetaminophen, cyclobenzaprine, morphine injection, nitroGLYCERIN, ondansetron (ZOFRAN) IV, sodium chloride flush   Vital Signs    Vitals:   07/12/18 2000 07/12/18 2050 07/12/18 2230 07/13/18 0632  BP:  (!) 151/95 (!) 148/91 130/88  Pulse: 87 97 93 77  Resp: 16 16 20  (!) 21  Temp:  98.7 F (37.1 C)  97.8 F (36.6 C)  TempSrc:  Oral  Oral  SpO2: 99% 98% 97% 99%  Weight:    96.5 kg  Height:        Intake/Output Summary (Last 24 hours) at 07/13/2018 0649 Last data filed at 07/12/2018 2051 Gross per 24 hour  Intake 719.79 ml  Output 200 ml  Net 519.79 ml   Filed Weights   07/11/18 0748 07/13/18 0865  Weight: 89.8 kg 96.5 kg    Physical Exam   General: Well developed, well nourished, NAD Neck: Negative for carotid bruits. No JVD Lungs:Clear to ausculation bilaterally. No wheezes, rales, or rhonchi. Breathing is unlabored. Cardiovascular: RRR with S1 S2. No murmurs, rubs, gallops, or LV heave appreciated. Extremities: No edema. No clubbing or cyanosis. DP/PT pulses 2+ bilaterally. RFA tightness, no hematoma at cath site.  Neuro: Alert and oriented. No focal deficits. No facial asymmetry. MAE spontaneously. Psych:  Responds to questions appropriately with normal affect.    Labs    Chemistry Recent Labs  Lab 07/10/18 1222 07/13/18 0341  NA 137 139  K 3.9 3.7  CL 101 103  CO2 24 27  GLUCOSE 117* 106*  BUN 14 14  CREATININE 0.93 0.94  CALCIUM 9.6 9.3  GFRNONAA >60 >60  GFRAA >60 >60  ANIONGAP 12 9     Hematology Recent Labs  Lab 07/10/18 1222 07/13/18 0341  WBC 9.5 9.5  RBC 4.19 3.93  HGB 12.5 11.4*  HCT 38.8 35.7*  MCV 92.6 90.8  MCH 29.8 29.0  MCHC 32.2 31.9  RDW 14.3 13.8  PLT 314 297    Cardiac EnzymesNo results for input(s): TROPONINI in the last 168 hours. No results for input(s): TROPIPOC in the last 168 hours.   BNPNo results for input(s): BNP, PROBNP in the last 168 hours.   DDimer No results for input(s): DDIMER in the last 168 hours.   Radiology    No results found.  Telemetry    07/13/2018 ST with rates in the 80-100 range- Personally Reviewed  ECG    07/13/2018 NSR with no change from prior tracing- Personally Reviewed  Cardiac Studies   LHC 07/12/2018:   CULPRIT LESION: Mid Cx lesion is 85% stenosed with 30-40% stenosed side branch in 2nd Mrg.  A  drug-eluting stent was successfully placed (crossing OM2) using a STENT SYNERGY DES 3X20 - post-dilated to 3.3 mm  Post intervention, there is a 0% residual stenosis.  -----------------------------------------  Mid LAD lesion is 30% stenosed.  Prox RCA lesion is 25% stenosed.  -----------------------------------------  The left ventricular systolic function is normal. The left ventricular ejection fraction is 55-65% by visual estimate.  LV end diastolic pressure is normal.   SUMMARY  Severe Single Vessel CAD mLCX (@ OM2) - s/p Successful DES PCI (Synergy DES 3.0 x 20 -- 3.3 mm)  Otherwise mild disease in LAD & RCA  Normal LVEF & normal to low EDP  RECOMMENDATIONS  Overnight monitoring post-PCI with TR Band Removal   Continue aggressive Risk Factor Modification  Patient Profile      58 y.o. female with history of hypothyroidism and hypertension who presented to the emergency room with complaints of chest pain.  Chest pain improved with nitroglycerin.  EKG showed ST depressions. High-sensitivity troponins negative>>worisome for unstable angina   Assessment & Plan    1. CAD [facility]: -Cath from 2018 with very minimal ACD (pLAD 20-30%) -Underwent LHC 07/12/2018 that [facility] with successful DES/PCI and otherwise mild disease in the LAD and RCA -Normal LVEF and low EDP -Recommendations for aggressive risk factor modification with ASA, Plavix, statin -On home Benicar>>>irbesartan/HCTZ (300/25) while in the hospital  -Will add beta-blocker, carvedilol 6.25mg  to start. May need OP titration.  -Denies recurrent chest pain   2. HTN: -Elevated, 148/91>151/95>145/80>143/84 -Continue regimen as above -Will add Carvedilol 6.25mg    3. Right wrist swelling: -Right wrist cath site unremarkable>>there is forearm tightness when compared to left arm. Denis pain. There is no change in sensation and patient has good cap refill -Will discuss US to r/o DVT prior to discharge with Dr. Robet LeuNaher  4. HLD: -LDL elevated at 148 with a goal of 70mg /dl  -Increase statin to 80mg  -Will need follow up Lipid panel anf LFTs in 6-8 weeks    Signed, Georgie ChardJill McDaniel NP-C [facility] Pager: [phone] 07/13/2018, 6:49 AM     For questions or updates, please contact   Please consult [email or web address] for contact info under Cardiology/STEMI.   Attending Note:   The patient was seen and examined.  Agree with assessment and plan as noted above.  Changes made to the above note as needed.  Patient seen and independently examined with Georgie ChardJill McDaniel, NP .   We discussed all aspects of the encounter. I agree with the assessment and plan as stated above.  1.  CAD :   Had PCI of her LCX.   Is doing well.   Has some swelling of her right lower arm .   Tense,  No focal  swelling to suggest a hematoma.  Radial pulse is good.  The swelling seems to be improving with elevation Advised her to limit her exercise for the week.   Keep elevated above her heart.   Call us for any issues.    I have spent a total of 40 minutes with patient reviewing hospital  notes , telemetry, EKGs, labs and examining patient as well as establishing an assessment and plan that was discussed with the patient. > 50% of time was spent in direct patient care.    Vesta MixerPhilip J. Julen Rubert, Montez HagemanJr., MD, [facility] 07/13/2018, 10:53 AM [address],  [address] Office [phone] Pager [phone]

## 2018-07-21 ENCOUNTER — Other Ambulatory Visit: Payer: Self-pay | Admitting: Internal Medicine

## 2018-07-23 NOTE — Progress Notes (Signed)
Cardiology Office Note   Date:  07/24/2018   ID:  [name], DOB [birth year], MRN [id number]  PCP:  [place]endale Chard, MD  Cardiologist:  Dr.[place]  CC: Hospital Follow Up    History of Present Illness: [name] is a 58 y.o. female who presents for posthospitalization follow-up.  The patient was admitted with complaints of chest pain with known history of CAD.  Cardiac catheterization 2018 showed minimal CAD in the proximal LAD of 20% to 30%.  Due to recurrence of chest discomfort the patient was planned for cardiac catheterization.  Other history includes hypothyroidism, hypertension, and hyperglycemia.  Cardiac cath on 07/12/2018 revealed mid circumflex lesion of 85%, with 30% to 40% stenosis side branch of the second marginal.  This required PCI with a Synergy DES.  The mid LAD lesion was 30% stenosed with a proximal RCA lesion 25% stenosed.  LVEF was 55% to 65%.  She was placed on dual antiplatelet therapy with aspirin and Plavix along with statin therapy.  Carvedilol 6.25 mg twice daily was ordered, she continued on irbesartan/HCTZ 300/25 mg daily.  She did have some post catheterization right wrist edema but there was no evidence of hematoma.  She had good capillary refill.  She was discharged on 07/13/2018.  She comes today without any complaints of recurrent chest pain or chest pressure.  Right wrist pain is prominent, and she does have a small hematoma with a good bit of ecchymosis proximal to the catheterization insertion site.  She states that sometimes she has shooting pain into her hand and up to her elbow.  She tried to do some computer work yesterday as she works from home and she was having a good bit of pain in her wrist.  She also had pain in her right temple which awakened her last evening.  Went away on its own.  She has chronic back pain with radiculopathy into the left hip and down the left leg.  Sometimes she has pain when she tries to stand on the left leg.  She has  had multiple back injections and has had lumbar spine surgery.  She was followed by [facility] but her insurance changed and she now needs to find another orthopedic practice.  Past Medical History:  Diagnosis Date   Allergy    Arthritis    back    Atypical chest pain 06/30/2016   Back pain    DUE TO INJURY AT WORK ON05/2013   Bruises easily    Chronic lower back pain    Depression    was on meds 2 yrs ago    Headache(784.0)    rarely   History of bronchitis    last time about 58yrs ago    Hypertension    takes Benicar daily   Hypothyroidism 06/30/2016   Vitamin D deficiency    takes Vitamin D 2 times/wk   Weakness    and numbness right foot    Past Surgical History:  Procedure Laterality Date   BACK SURGERY     CARPAL TUNNEL RELEASE Right    [facility] [address]   "took out fatty tumor" (11/09/2012)   LEFT HEART CATH AND CORONARY ANGIOGRAPHY N/A 08/06/2016   Procedure: Left Heart Cath and Coronary Angiography;  Surgeon: Belva Crome, MD;  Location: [place] [facility];  Service: Cardiovascular;  Laterality: N/A;   LEFT HEART CATH AND CORONARY ANGIOGRAPHY N/A 07/12/2018  Procedure: LEFT HEART CATH AND CORONARY ANGIOGRAPHY;  Surgeon: Marykay LexHarding, David W, MD;  [facility];  Service: Cardiovascular;  Laterality: N/A;   LUMBAR LAMINECTOMY/DECOMPRESSION MICRODISCECTOMY  11/09/2012   "L3-4" (11/09/2012)   LUMBAR LAMINECTOMY/DECOMPRESSION MICRODISCECTOMY N/A 11/09/2012   Procedure: L3-L4 DECOMPRESSION AND MICRODISCECTOMY   (1 LEVEL);  Surgeon: Venita Lickahari Brooks, MD;  [facility];  Service: Orthopedics;  Laterality: N/A;     Current Outpatient Medications  Medication Sig Dispense Refill   aspirin EC 81 MG EC tablet Take 1 tablet (81 mg total) by mouth daily.     atorvastatin (LIPITOR) 80 MG tablet Take 1 tablet (80 mg total) by mouth daily at 6 PM. 30 tablet 1   carvedilol (COREG)  6.25 MG tablet Take 1 tablet (6.25 mg total) by mouth 2 (two) times daily with a meal. 60 tablet 1   clopidogrel (PLAVIX) 75 MG tablet Take 1 tablet (75 mg total) by mouth daily with breakfast. 30 tablet 1   cyclobenzaprine (FLEXERIL) 10 MG tablet TAKE 1 TABLET (10 MG TOTAL) BY MOUTH DAILY AS NEEDED FOR MUSCLE SPASMS. 30 tablet 0   levocetirizine (XYZAL) 5 MG tablet TAKE 1 TABLET BY MOUTH EVERY DAY 90 tablet 1   loratadine (CLARITIN) 10 MG tablet Take 10 mg by mouth daily.     nitroGLYCERIN (NITROSTAT) 0.4 MG SL tablet Place 1 tablet (0.4 mg total) under the tongue every 5 (five) minutes as needed for chest pain. 30 tablet 1   olmesartan-hydrochlorothiazide (BENICAR HCT) 40-25 MG tablet TAKE 1 TABLET BY MOUTH EVERY DAY (Patient taking differently: Take 1 tablet by mouth daily. ) 90 tablet 0   VITAMIN D PO Take 1 tablet by mouth daily.     traMADol (ULTRAM) 50 MG tablet Take 1 tablet (50 mg total) by mouth every 6 (six) hours as needed. 20 tablet 0   No current facility-administered medications for this visit.     Allergies:   Patient has no known allergies.    Social History:  The patient  reports that she has never smoked. She has never used smokeless tobacco. She reports current alcohol use of about 7.0 standard drinks of alcohol per week. She reports that she does not use drugs.   Family History:  The patient's family history includes CAD in her mother; Heart disease in her mother and sister; Liver disease in her brother; Stroke in her mother.    ROS: All other systems are reviewed and negative. Unless otherwise mentioned in H&P    PHYSICAL EXAM: VS:  BP 139/83    Pulse 88    Ht 5\' 1"  (1.549 m)    Wt 208 lb (94.3 kg)    LMP 06/03/2011    BMI 39.30 kg/m  , BMI Body mass index is 39.3 kg/m. GEN: Well nourished, well developed, in no acute distress HEENT: normal Neck: no JVD, carotid bruits, or masses Cardiac: RRR; no murmurs, rubs, or gallops,no edema  Respiratory:  Clear to  auscultation bilaterally, normal work of breathing GI: soft, nontender, nondistended, + BS MS: no deformity or atrophy.  Right wrist small hematoma, good pulse.  Ecchymosis of to the elbow.  Hand strength is normal but does cause her to have some pain with handgrip. Skin: warm and dry, no rash Neuro:  Strength and sensation are intact Psych: euthymic mood, full affect   EKG: Normal sinus rhythm, left atrial enlargement ventricular rate of 88 bpm.  Recent Labs: 03/14/2018: ALT 21; TSH 2.000 07/13/2018: BUN 14; Creatinine,  Ser 0.94; Hemoglobin 11.4; Platelets 297; Potassium 3.7; Sodium 139    Lipid Panel    Component Value Date/Time   CHOL 217 (H) 07/11/2018 0612   TRIG 92 07/11/2018 0612   HDL 51 07/11/2018 0612   CHOLHDL 4.3 07/11/2018 0612   VLDL 18 07/11/2018 0612   LDLCALC 148 (H) 07/11/2018 0612      Wt Readings from Last 3 Encounters:  07/24/18 208 lb (94.3 kg)  07/13/18 212 lb 11.9 oz (96.5 kg)  03/14/18 196 lb 12.8 oz (89.3 kg)      Other studies Reviewed: Cardiac Cath:  CULPRIT LESION: Mid Cx lesion is 85% stenosed with 30-40% stenosed side branch in 2nd Mrg.  A drug-eluting stent was successfully placed (crossing OM2) using a STENT SYNERGY DES 3X20 - post-dilated to 3.3 mm  Post intervention, there is a 0% residual stenosis.  -----------------------------------------  Mid LAD lesion is 30% stenosed.  Prox RCA lesion is 25% stenosed.  -----------------------------------------  The left ventricular systolic function is normal. The left ventricular ejection fraction is 55-65% by visual estimate.  LV end diastolic pressure is normal.   SUMMARY  Severe Single Vessel CAD mLCX (@ OM2) - s/p Successful DES PCI (Synergy DES 3.0 x 20 -- 3.3 mm)  Otherwise mild disease in LAD & RCA  Normal LVEF & normal to low EDP   ASSESSMENT AND PLAN:  1.  Coronary artery disease: Status post cardiac catheterization with DES to the circumflex, with jailed first marginal  branch.  She is on dual antiplatelet therapy with aspirin and Plavix.  She denies any bleeding issues or recurrent discomfort in her chest.  She does have a right wrist hematoma at the catheterization site.  She does have some shooting pain and trouble with typing on a computer without having significant pain as well.  I will give her a prescription for tramadol 50 mg p.o. every 8 hours as needed pain.  Any further refills should come from primary care.  A return to work letter for July 31, 2018 is been provided for her.  She is not to lift anything with the right arm, or any exertional activity with the right arm Re:: Vacuuming raking, lifting heavy laundry etc.  2.  Hypertension: Currently well controlled on current medication regimen.  No changes at this time.  3.  Chronic back pain with leg pain: She has good distal pulses without any edema.  She is [facility]  We do not have access [facility] at [facility].  She is to contact [facility] care [facility] or [facility] to be established with a new [facility]opedic provider.  4.  Hypercholesterolemia: LDL should be less than 70 in the setting of CAD.  Continue atorvastatin.  She will need to have follow-up lipids and LFTs at next office visit.   Current medicines are reviewed at length with the patient today.    Labs/ tests ordered today include: None  Bettey MareKathryn M. Liborio NixonLawrence DNP, ANP, AACC   07/24/2018 1:53 PM    [facility] [address] Office [phone] Fax [phone]

## 2018-07-24 ENCOUNTER — Telehealth (HOSPITAL_COMMUNITY): Payer: Self-pay

## 2018-07-24 ENCOUNTER — Telehealth (HOSPITAL_COMMUNITY): Payer: Self-pay | Admitting: *Deleted

## 2018-07-24 ENCOUNTER — Encounter: Payer: Self-pay | Admitting: Adult Health

## 2018-07-24 ENCOUNTER — Ambulatory Visit (INDEPENDENT_AMBULATORY_CARE_PROVIDER_SITE_OTHER): Payer: BLUE CROSS/BLUE SHIELD | Admitting: Adult Health

## 2018-07-24 ENCOUNTER — Other Ambulatory Visit: Payer: Self-pay

## 2018-07-24 VITALS — BP 139/83 | HR 88 | Ht 61.0 in | Wt 208.0 lb

## 2018-07-24 DIAGNOSIS — I2510 Atherosclerotic heart disease of native coronary artery without angina pectoris: Secondary | ICD-10-CM | POA: Diagnosis not present

## 2018-07-24 DIAGNOSIS — I10 Essential (primary) hypertension: Secondary | ICD-10-CM | POA: Diagnosis not present

## 2018-07-24 DIAGNOSIS — E7800 Pure hypercholesterolemia, unspecified: Secondary | ICD-10-CM

## 2018-07-24 DIAGNOSIS — S60211S Contusion of right wrist, sequela: Secondary | ICD-10-CM

## 2018-07-24 MED ORDER — TRAMADOL HCL 50 MG PO TABS: 50.0000 mg | ORAL_TABLET | Freq: Four times a day (QID) | ORAL | 0 refills | Status: DC | PRN

## 2018-07-24 NOTE — Telephone Encounter (Signed)
-----   Message from Lendon Colonel, NP sent at 07/24/2018  2:28 PM EDT ----- Regarding: RE: [facility] for cardiac rehab and restrictions for right arm?? I agree that walking on a treadmill would be a problem with her leg. The wrist will get better. She can do seated exercises to get her started.  Maybe the bicycle or something like that.  Then progress as able.   Thanks for asking.  Curt Bears ----- Message ----- From: Rowe Pavy, RN Sent: 07/24/2018   2:12 PM EDT To: Lendon Colonel, NP Subject: [facility] for cardiac rehab and restrictions for ri#   Hi Curt Bears,  The above pt is eligible to participate in Cardiac rehab.  I reviewed your office note and see that she has discomfort at the cath site and should avoid "not to lift anything with the right arm, or any exertional activity with the right arm Re:: Vacuuming raking, lifting heavy laundry etc." I am assuming exercises that involve the right arm should be avoided.  What sort of time frame do you believe appropriate for this pt?  Or should we hold on scheduling until further follow up by the primary MD - has appt scheduled for September.  Noted chronic history of back and leg pain which further may impede her ability to exercise as well and water exercises when able to may be the best option.  What are your thoughts??  Cherre Huger, BSN Cardiac and Training and development officer

## 2018-07-24 NOTE — Patient Instructions (Signed)
Medication Instructions:  Continue current medications  If you need a refill on your cardiac medications before your next appointment, please call your pharmacy.  Labwork: None Ordered   Testing/Procedures: None Ordered  Follow-Up: You will need a follow up appointment in 3 months.  Please call our office 2 months in advance to schedule this appointment.  You may see Tiffany [place], MD or one of the following Advanced Practice Providers on your designated Care Team:   Luke Kilroy, PA-C Krista Kroeger, PA-C . Callie Goodrich, PA-C     At [facility], you and your health needs are our priority.  As part of our continuing mission to provide you with exceptional heart care, we have created designated Provider Care Teams.  These Care Teams include your primary Cardiologist (physician) and Advanced Practice Providers (APPs -  Physician Assistants and Nurse Practitioners) who all work together to provide you with the care you need, when you need it.  Thank you for choosing [facility] at [address]!!     

## 2018-07-24 NOTE — Telephone Encounter (Signed)
Pt insurance is active and benefits verified through [place]. Co-pay $0.00, DED $7,500.00/$4,705.01 met, out of pocket $8,150.00/$4,817.90 met, co-insurance 40%. No pre-authorization required. Passport, 07/24/2018 @ 2:40PM, [phone]

## 2018-08-08 ENCOUNTER — Other Ambulatory Visit: Payer: Self-pay | Admitting: Internal Medicine

## 2018-08-09 ENCOUNTER — Inpatient Hospital Stay (HOSPITAL_COMMUNITY)
Admission: RE | Admit: 2018-08-09 | Discharge: 2018-08-09 | Disposition: A | Discharge status: Home / Self Care | Payer: BLUE CROSS/BLUE SHIELD | Source: Ambulatory Visit | Attending: Cardiovascular Disease | Admitting: Cardiovascular Disease

## 2018-08-09 ENCOUNTER — Telehealth (HOSPITAL_COMMUNITY): Payer: Self-pay | Admitting: *Deleted

## 2018-08-09 ENCOUNTER — Encounter (HOSPITAL_COMMUNITY)
Admission: RE | Admit: 2018-08-09 | Discharge: 2018-08-09 | Disposition: A | Discharge status: Home / Self Care | Payer: BLUE CROSS/BLUE SHIELD | Source: Ambulatory Visit | Attending: Cardiovascular Disease | Admitting: Cardiovascular Disease

## 2018-08-09 ENCOUNTER — Other Ambulatory Visit: Payer: Self-pay

## 2018-08-09 ENCOUNTER — Telehealth (HOSPITAL_COMMUNITY): Payer: Self-pay

## 2018-08-09 NOTE — Progress Notes (Addendum)
Called and left message regarding 4:00 virtual cardiac rehab appt.  Requested call back. Cherre Huger, BSN Cardiac and Training and development officer

## 2018-08-09 NOTE — Telephone Encounter (Signed)
Cyclobenzaprine refill 

## 2018-08-09 NOTE — Telephone Encounter (Signed)
Pt returned call.  Pt is interested in participating in Virtual Cardiac Rehab. Pt advised that Virtual Cardiac Rehab is provided at no cost to the patient.  Checklist:  1. Pt has smart device  ie smartphone and/or ipad for downloading an app  Yes 2. Reliable internet/wifi service    Yes 3. Understands how to use their smartphone and navigate within an app.  Yes   Reviewed with pt the scheduling process for virtual cardiac rehab.  Pt verbalized understanding.            Confirm Consent - In the setting of the current Covid19 crisis, you are scheduled for a phone visit with your Cardiac or Pulmonary team member.  Just as we do with many in-gym visits, in order for you to participate in this visit, we must obtain consent.  If you'd like, I can send this to your mychart (if signed up) or email for you to review.  Otherwise, I can obtain your verbal consent now.  By agreeing to a telephone visit, we'd like you to understand that the technology does not allow for your Cardiac or Pulmonary Rehab team member to perform a physical assessment, and thus may limit their ability to fully assess your ability to perform exercise programs. If your provider identifies any concerns that need to be evaluated in person, we will make arrangements to do so.  Finally, though the technology is pretty good, we cannot assure that it will always work on either your or our end and we cannot ensure that we have a secure connection.  Cardiac and Pulmonary Rehab Telehealth visits and "At Home" cardiac and pulmonary rehab are provided at no cost to you.        Are you willing to proceed?"        STAFF: Did the patient verbally acknowledge consent to telehealth visit? Document YES/NO here: Yes     Maurice Small RN, BSN Cardiac and Pulmonary Rehab Nurse Navigator    Cardiac and Pulmonary Rehab Staff        Date 08/09/18    @ Time [phone]

## 2018-08-15 ENCOUNTER — Other Ambulatory Visit: Payer: Self-pay | Admitting: Physical Medicine and Rehabilitation

## 2018-08-15 DIAGNOSIS — M5416 Radiculopathy, lumbar region: Secondary | ICD-10-CM

## 2018-08-16 ENCOUNTER — Other Ambulatory Visit: Payer: Self-pay | Admitting: Internal Medicine

## 2018-08-16 ENCOUNTER — Other Ambulatory Visit: Payer: Self-pay | Admitting: Adult Health

## 2018-08-16 ENCOUNTER — Telehealth: Payer: Self-pay

## 2018-08-16 NOTE — Telephone Encounter (Signed)
Called the requesting left a voice message and will fax via [place].

## 2018-08-16 NOTE — Telephone Encounter (Signed)
   [facility] Pre-operative Risk Assessment    Request for surgical clearance:  1. What type of surgery is being performed? LUMBAR ESI   2. When is this surgery scheduled? TBD   3. What type of clearance is required (medical clearance vs. Pharmacy clearance to hold med vs. Both)? BOTH  4. Are there any medications that need to be held prior to surgery and how long? PLAVIX 5 DAYS   5. Practice name and name of physician performing surgery?  [facility]    6. What is your office phone number [phone]    7.   What is your office fax number [phone]  8.   Anesthesia type (None, local, MAC, general) ? LOCAL   Waylan Rocher 08/16/2018, 3:51 PM  _________________________________________________________________   (provider comments below)

## 2018-08-16 NOTE — Telephone Encounter (Signed)
   Primary Cardiologist: Skeet Latch, MD  Chart reviewed as part of pre-operative protocol coverage. Patient was contacted 08/16/2018 in reference to pre-operative risk assessment for pending surgery as outlined below.  [name] recently underwent a cardiac catheterization with PCI/DES to LCx 07/12/2018 and was recommended for uninterrupted DAPT with aspirin and plavix for a minimum of 1 year. Given recent intervention, patient is not cleared for requested procedure prior to 07/12/2019.   Pre-op covering staff: - Please contact requesting surgeon's office via preferred method (i.e, phone, fax) to inform them of need for appointment prior to surgery.  Abigail Butts, PA-C 08/16/2018, 4:37 PM

## 2018-08-18 ENCOUNTER — Emergency Department (HOSPITAL_COMMUNITY): Payer: BLUE CROSS/BLUE SHIELD

## 2018-08-18 ENCOUNTER — Encounter (HOSPITAL_COMMUNITY): Payer: Self-pay | Admitting: *Deleted

## 2018-08-18 ENCOUNTER — Other Ambulatory Visit: Payer: Self-pay

## 2018-08-18 ENCOUNTER — Emergency Department (HOSPITAL_COMMUNITY)
Admission: EM | Admit: 2018-08-18 | Discharge: 2018-08-18 | Disposition: A | Discharge status: Home / Self Care | Payer: BLUE CROSS/BLUE SHIELD | Attending: Emergency Medicine | Admitting: Emergency Medicine

## 2018-08-18 DIAGNOSIS — Z7982 Long term (current) use of aspirin: Secondary | ICD-10-CM | POA: Insufficient documentation

## 2018-08-18 DIAGNOSIS — H538 Other visual disturbances: Secondary | ICD-10-CM | POA: Diagnosis present

## 2018-08-18 DIAGNOSIS — Z7902 Long term (current) use of antithrombotics/antiplatelets: Secondary | ICD-10-CM | POA: Insufficient documentation

## 2018-08-18 DIAGNOSIS — E039 Hypothyroidism, unspecified: Secondary | ICD-10-CM | POA: Insufficient documentation

## 2018-08-18 DIAGNOSIS — H539 Unspecified visual disturbance: Secondary | ICD-10-CM

## 2018-08-18 DIAGNOSIS — Z79899 Other long term (current) drug therapy: Secondary | ICD-10-CM | POA: Insufficient documentation

## 2018-08-18 DIAGNOSIS — R51 Headache: Secondary | ICD-10-CM | POA: Diagnosis not present

## 2018-08-18 DIAGNOSIS — I25110 Atherosclerotic heart disease of native coronary artery with unstable angina pectoris: Secondary | ICD-10-CM | POA: Diagnosis not present

## 2018-08-18 DIAGNOSIS — I6521 Occlusion and stenosis of right carotid artery: Secondary | ICD-10-CM | POA: Diagnosis not present

## 2018-08-18 DIAGNOSIS — I6522 Occlusion and stenosis of left carotid artery: Secondary | ICD-10-CM | POA: Diagnosis not present

## 2018-08-18 DIAGNOSIS — R519 Headache, unspecified: Secondary | ICD-10-CM

## 2018-08-18 DIAGNOSIS — I10 Essential (primary) hypertension: Secondary | ICD-10-CM | POA: Diagnosis not present

## 2018-08-18 DIAGNOSIS — G43109 Migraine with aura, not intractable, without status migrainosus: Secondary | ICD-10-CM | POA: Diagnosis not present

## 2018-08-18 HISTORY — DX: Atherosclerotic heart disease of native coronary artery without angina pectoris: I25.10

## 2018-08-18 LAB — I-STAT BETA HCG BLOOD, ED (MC, WL, AP ONLY): I-stat hCG, quantitative: <5 m[IU]/mL (ref <5)

## 2018-08-18 LAB — I-STAT CHEM 8, ED
BUN: 13 mg/dL (ref 6–20)
Calcium, Ion: 1.26 mmol/L (ref 1.15–1.40)
Chloride: 103 mmol/L (ref 98–111)
Creatinine, Ser: 0.9 mg/dL (ref 0.44–1.00)
Glucose, Bld: 94 mg/dL (ref 70–99)
HCT: 40 % (ref 36.0–46.0)
Hemoglobin: 13.6 g/dL (ref 12.0–15.0)
Potassium: 3.9 mmol/L (ref 3.5–5.1)
Sodium: 141 mmol/L (ref 135–145)
TCO2: 29 mmol/L (ref 22–32)

## 2018-08-18 LAB — C-REACTIVE PROTEIN: CRP: <0.8 mg/dL (ref <1.0)

## 2018-08-18 LAB — COMPREHENSIVE METABOLIC PANEL
ALT: 34 U/L (ref 0–44)
AST: 30 U/L (ref 15–41)
Albumin: 4.3 g/dL (ref 3.5–5.0)
Alkaline Phosphatase: 91 U/L (ref 38–126)
Anion gap: 10 (ref 5–15)
BUN: 12 mg/dL (ref 6–20)
CO2: 27 mmol/L (ref 22–32)
Calcium: 9.8 mg/dL (ref 8.9–10.3)
Chloride: 103 mmol/L (ref 98–111)
Creatinine, Ser: 0.95 mg/dL (ref 0.44–1.00)
GFR calc Af Amer: >60 mL/min (ref >60)
GFR calc non Af Amer: >60 mL/min (ref >60)
Glucose, Bld: 97 mg/dL (ref 70–99)
Potassium: 4 mmol/L (ref 3.5–5.1)
Sodium: 140 mmol/L (ref 135–145)
Total Bilirubin: 0.8 mg/dL (ref 0.3–1.2)
Total Protein: 8.2 g/dL — ABNORMAL HIGH (ref 6.5–8.1)

## 2018-08-18 LAB — CBC
HCT: 37.4 % (ref 36.0–46.0)
Hemoglobin: 11.9 g/dL — ABNORMAL LOW (ref 12.0–15.0)
MCH: 29 pg (ref 26.0–34.0)
MCHC: 31.8 g/dL (ref 30.0–36.0)
MCV: 91 fL (ref 80.0–100.0)
Platelets: 329 10*3/uL (ref 150–400)
RBC: 4.11 MIL/uL (ref 3.87–5.11)
RDW: 13.3 % (ref 11.5–15.5)
WBC: 8.1 10*3/uL (ref 4.0–10.5)
nRBC: 0 % (ref 0.0–0.2)

## 2018-08-18 LAB — DIFFERENTIAL
Abs Immature Granulocytes: 0.02 10*3/uL (ref 0.00–0.07)
Basophils Absolute: 0 10*3/uL (ref 0.0–0.1)
Basophils Relative: 1 %
Eosinophils Absolute: 0.1 10*3/uL (ref 0.0–0.5)
Eosinophils Relative: 1 %
Immature Granulocytes: 0 %
Lymphocytes Relative: 29 %
Lymphs Abs: 2.4 10*3/uL (ref 0.7–4.0)
Monocytes Absolute: 0.8 10*3/uL (ref 0.1–1.0)
Monocytes Relative: 10 %
Neutro Abs: 4.8 10*3/uL (ref 1.7–7.7)
Neutrophils Relative %: 59 %

## 2018-08-18 LAB — TROPONIN I (HIGH SENSITIVITY)
Troponin I (High Sensitivity): <2 ng/L (ref <18)
Troponin I (High Sensitivity): <2 ng/L (ref <18)

## 2018-08-18 LAB — PROTIME-INR
INR: 1.1 (ref 0.8–1.2)
Prothrombin Time: 13.8 seconds (ref 11.4–15.2)

## 2018-08-18 LAB — APTT: aPTT: 27 seconds (ref 24–36)

## 2018-08-18 LAB — SEDIMENTATION RATE: Sed Rate: 50 mm/hr — ABNORMAL HIGH (ref 0–22)

## 2018-08-18 IMAGING — CT CT HEAD WITHOUT CONTRAST
3 of 4 series · 13 of 47 positions shown, 15 images · non-contrast
Comparison: [DATE]

CLINICAL DATA: Left-sided headache and dizziness.

EXAM:
CT HEAD WITHOUT CONTRAST
TECHNIQUE: Contiguous axial images were obtained from the base of the skull
through the vertex without intravenous contrast.

[Series 3: head without · axial · non-contrast · 0.46mm/px · z∈[+1040,+1165]mm · 7 of 35 slices shown, 9 images]
[im 5/35  brain]
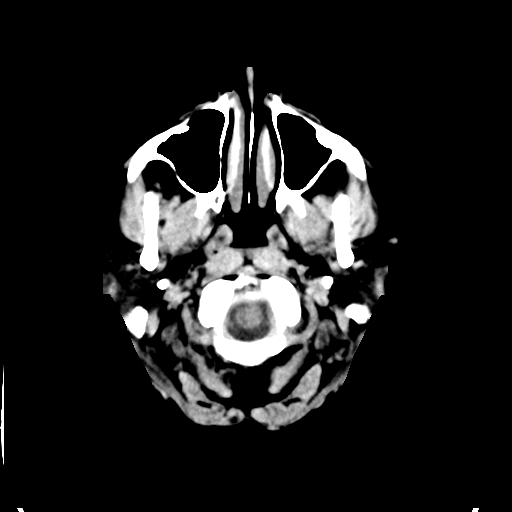
[im 5/35  bone]
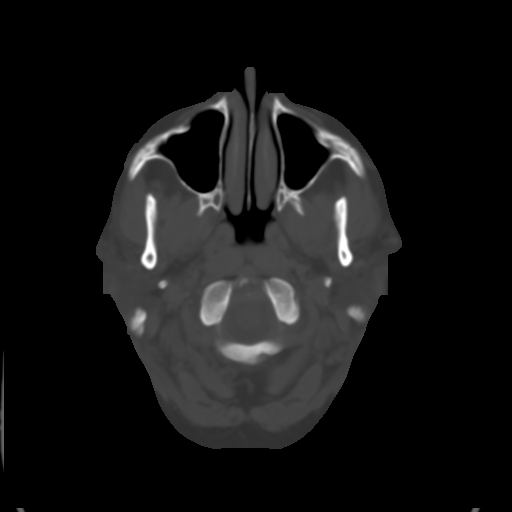
[im 9/35  brain]
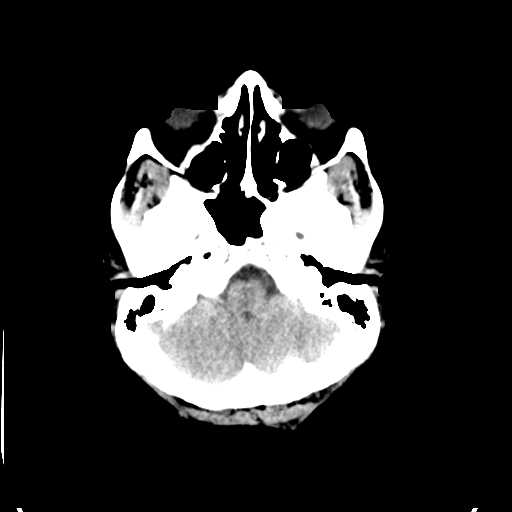
[im 13/35  brain]
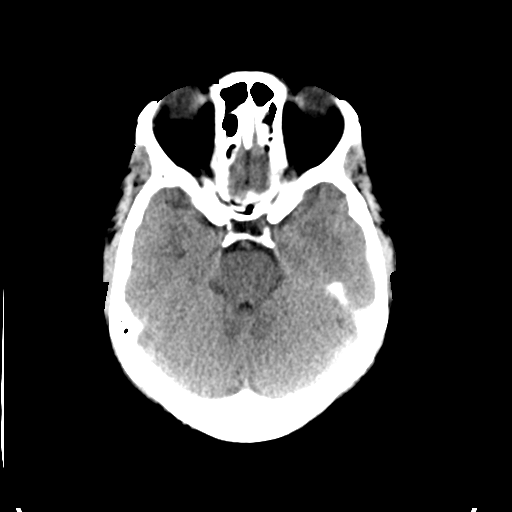
[im 18/35  brain]
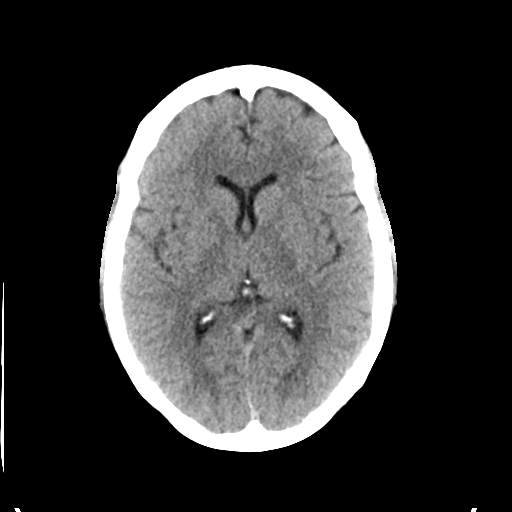
[im 22/35  brain]
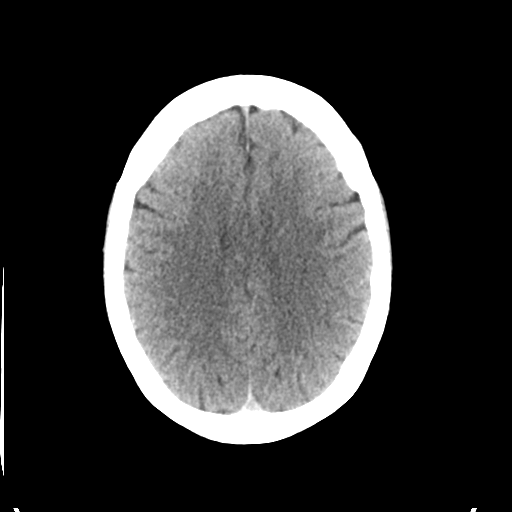
[im 22/35  bone]
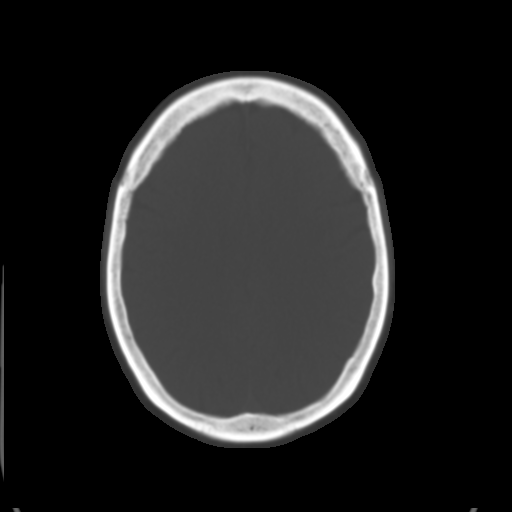
[im 26/35  brain]
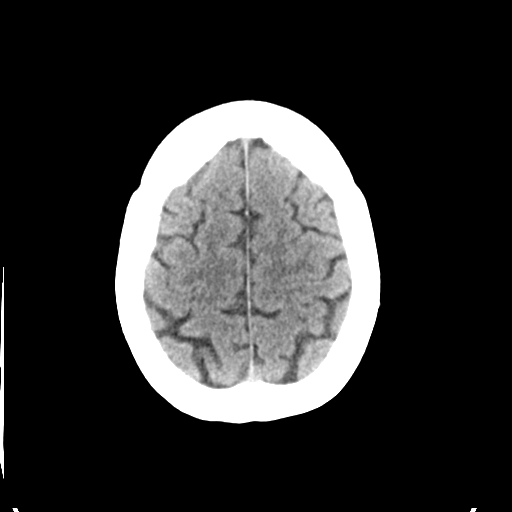
[im 30/35  brain]
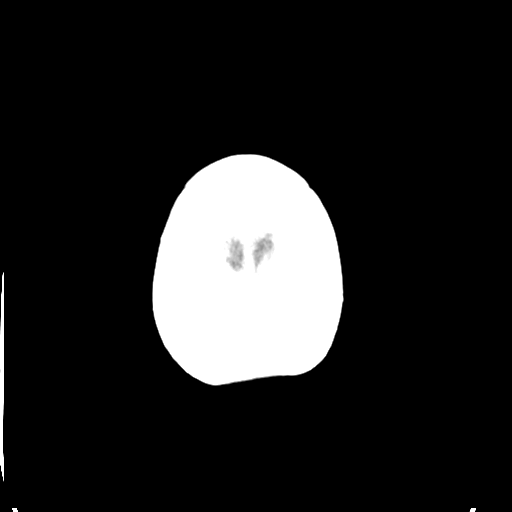

[Series 5: head without cor · coronal · non-contrast · 0.34mm/px · 3 of 67 slices shown]
[im 23/67  brain]
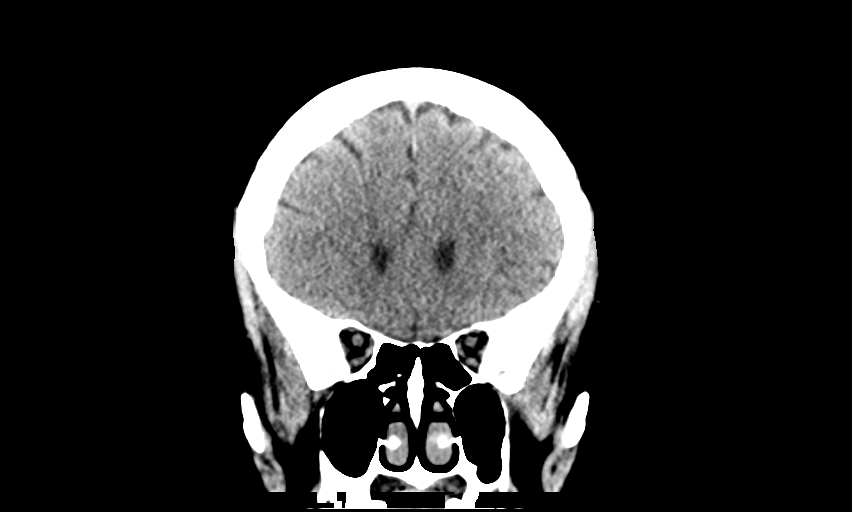
[im 30/67  brain]
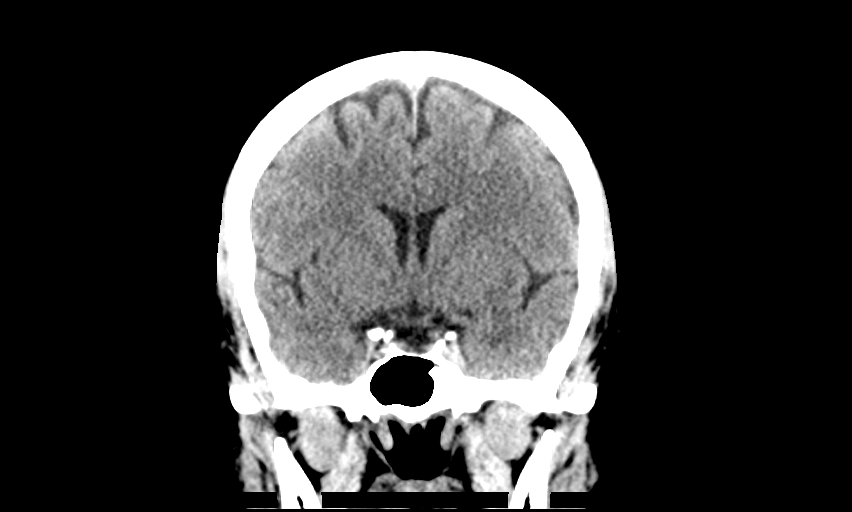
[im 37/67  brain]
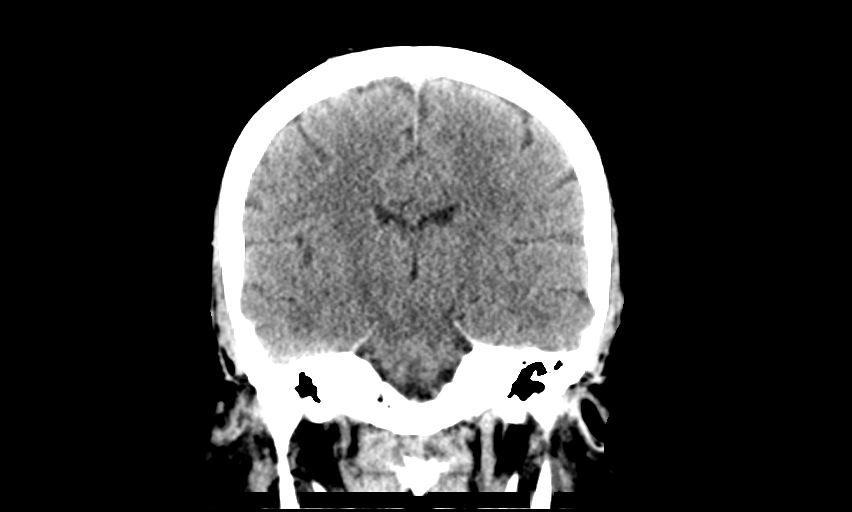

[Series 6: head without sag · sagittal · non-contrast · 0.34mm/px · 3 of 67 slices shown]
[im 23/67  brain]
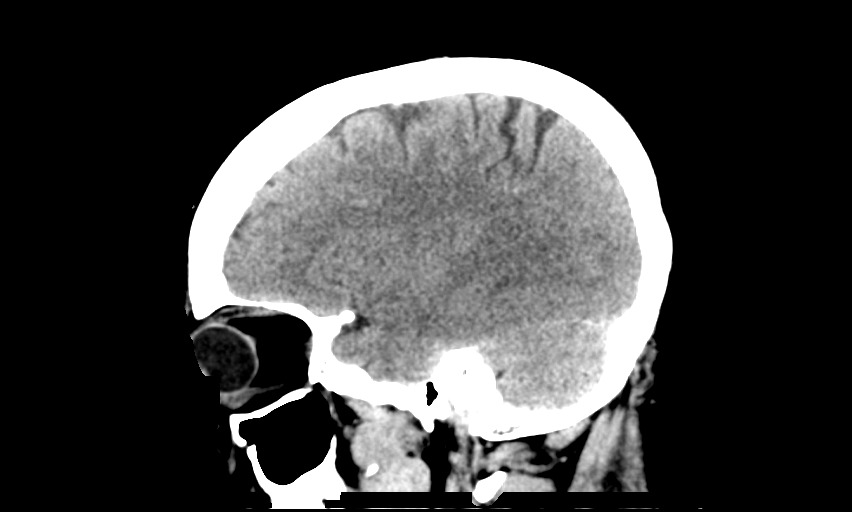
[im 34/67  brain]
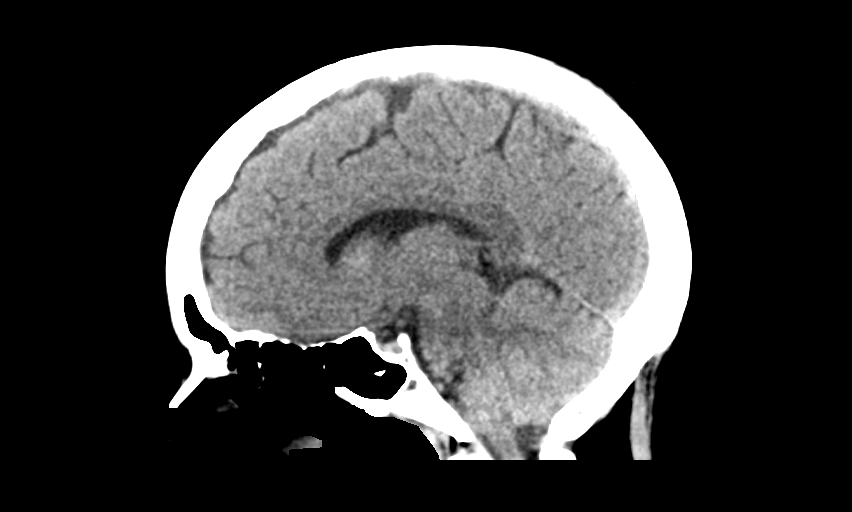
[im 45/67  brain]
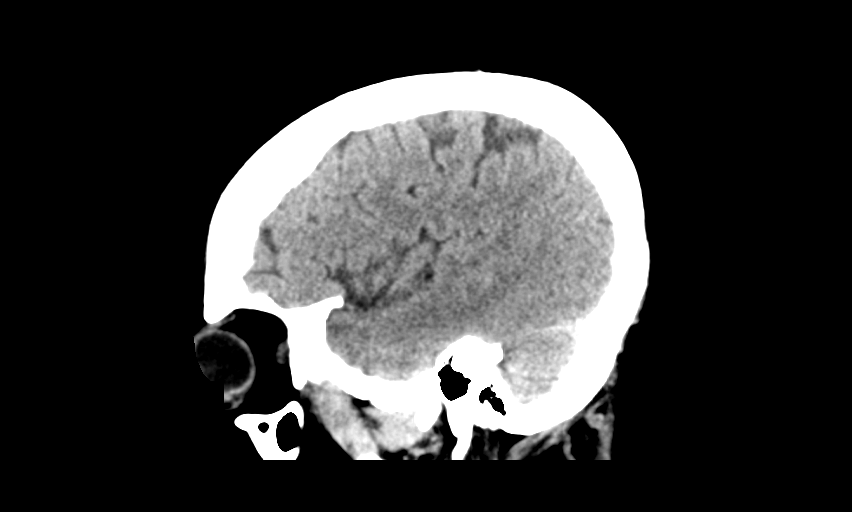

[13 of 47 positions shown; findings below may reference images not displayed]

FINDINGS: Brain: The brain shows a normal appearance without evidence of
malformation, atrophy, old or acute small or large vessel
infarction, mass lesion, hemorrhage, hydrocephalus or extra-axial
collection.

Vascular: No hyperdense vessel. No evidence of atherosclerotic
calcification.

Skull: Normal.  No traumatic finding.  No focal bone lesion.

Sinuses/Orbits: Sinuses are clear. Orbits appear normal. Mastoids
are clear.

Other: None significant
IMPRESSION: Normal examination. No abnormality seen to explain the clinical
presentation.

## 2018-08-18 IMAGING — MR MRI HEAD WITHOUT CONTRAST
20 of 22 series · 42 of 48 positions shown · IV contrast (agent unspecified)
Comparison: CT head [DATE]

CLINICAL DATA: Focal neuro deficit greater than 6 hours. Suspect
stroke. Left-sided headache and vision change.



[Series 9: DWI · axial · 3.0mm · 0.88mm/px · z∈[-71,+80]mm · 2 of 104 slices shown (1 of 4)]
[im 1/104]
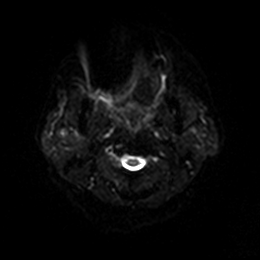
[im 104/104]
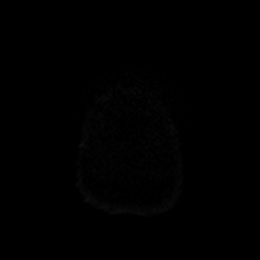

[Series 10: DWI · axial · 3.0mm · 0.88mm/px · 1 of 52 slices shown (2 of 4)]
[im 1/52]
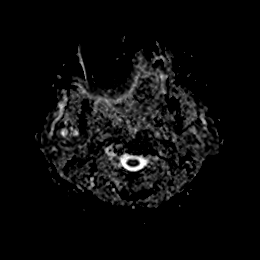

[Series 11: DWI · coronal · 4.0mm · 0.88mm/px · 1 of 72 slices shown (3 of 4)]
[im 1/72]
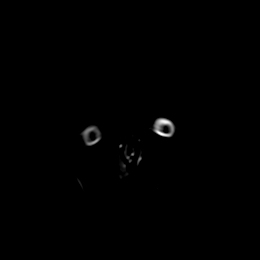

[Series 12: DWI · coronal · 4.0mm · 0.88mm/px · 1 of 36 slices shown (4 of 4)]
[im 1/36]
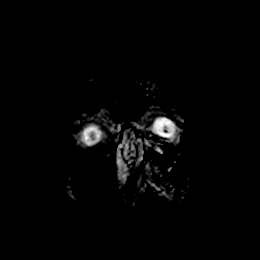

[Series 19: tof_fl3d_tra_iso · axial · 0.6mm · 0.52mm/px · z∈[-153,-76]mm · 4 of 133 slices shown]
[im 1/133]
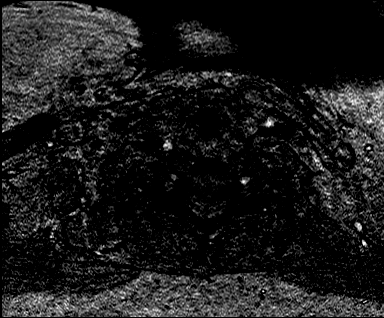
[im 45/133]
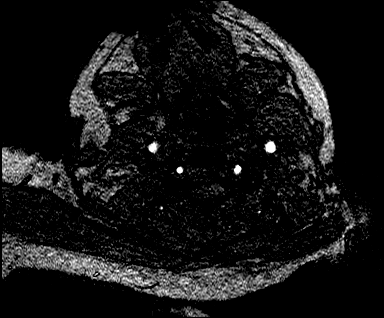
[im 89/133]
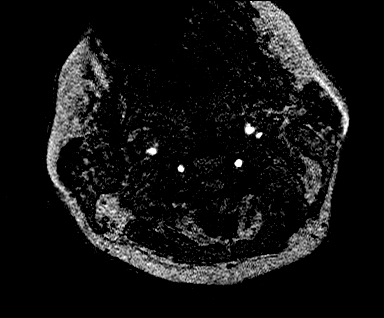
[im 133/133]
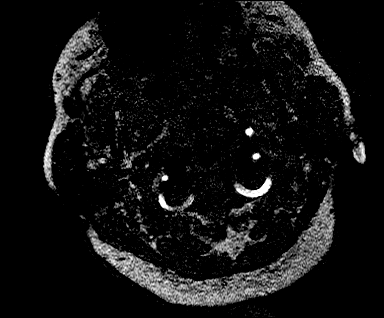

[Series 22: T1 · sagittal · 5.0mm · 0.75mm/px · 1 of 20 slices shown]
[im 1/20]
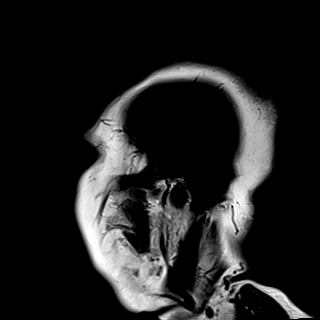

[Series 23: T2 · axial · 5.0mm · 0.72mm/px · 1 of 25 slices shown (1 of 2)]
[im 1/25]
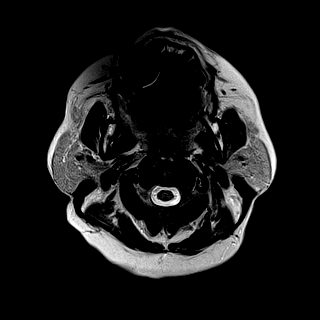

[Series 24: FLAIR · axial · 5.0mm · 0.45mm/px · 1 of 25 slices shown]
[im 1/25]
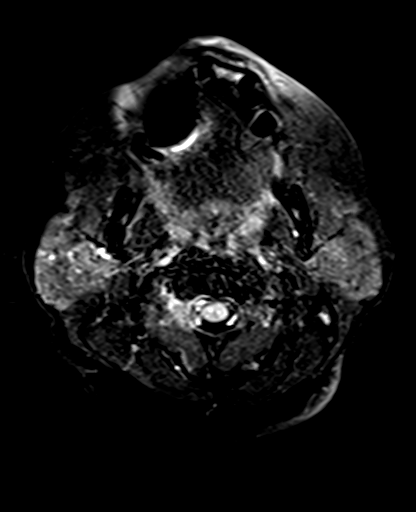

[Series 25: mag_images · axial · 3.0mm · 0.90mm/px · z∈[-79,+96]mm · 2 of 60 slices shown]
[im 1/60]
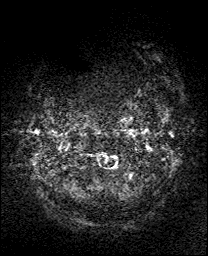
[im 60/60]
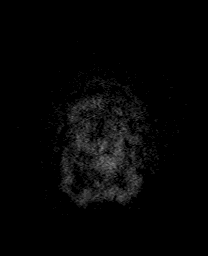

[Series 26: pha_images · axial · 3.0mm · 0.90mm/px · z∈[-79,+96]mm · 2 of 59 slices shown]
[im 1/59]
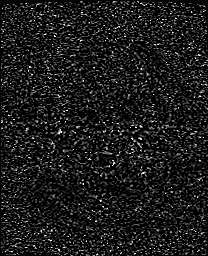
[im 59/59]
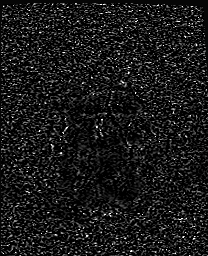

[Series 27: swi_images · axial · 3.0mm · 0.90mm/px · z∈[-79,+96]mm · 2 of 60 slices shown]
[im 1/60]
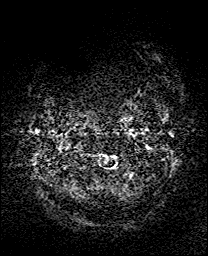
[im 60/60]
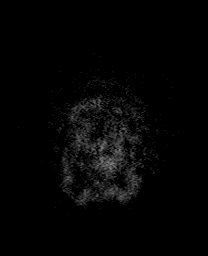

[Series 28: mip_images(sw) · axial · 24.0mm · 0.90mm/px · z∈[-68,+86]mm · 2 of 53 slices shown]
[im 1/53]
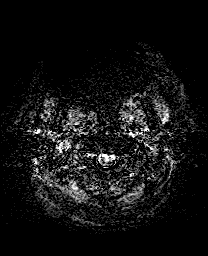
[im 53/53]
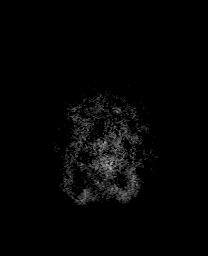

[Series 30: T2 · coronal · 5.0mm · 0.34mm/px · 1 of 29 slices shown (2 of 2)]
[im 1/29]
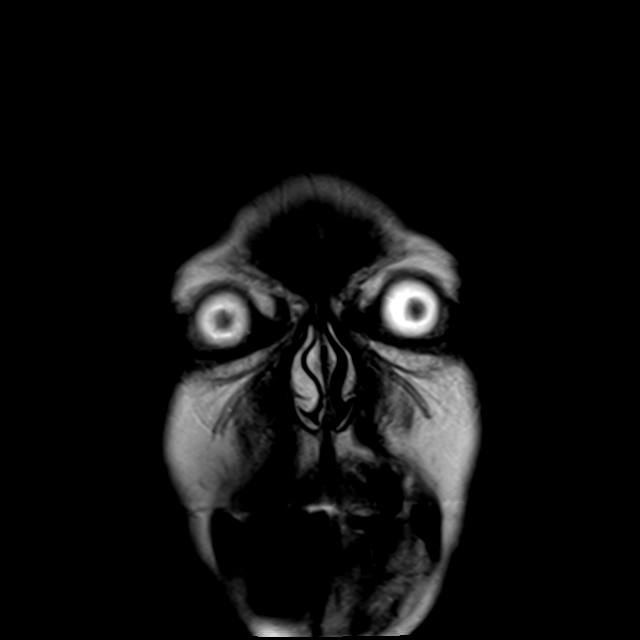

[Series 31: angio_fl3d_cor_pre_ttc=3.0s · coronal · 0.9mm · 0.85mm/px · 3 of 88 slices shown]
[im 1/88]
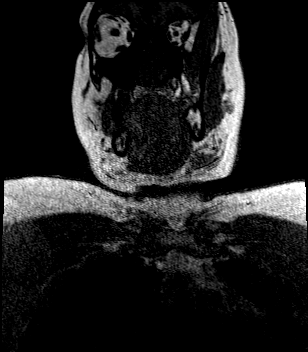
[im 44/88]
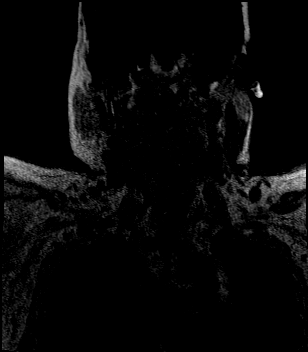
[im 88/88]
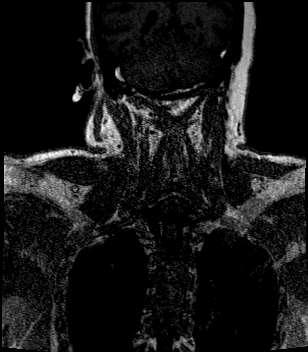

[Series 33: angio_fl3d_cor_post_ttc=3.0s · coronal · 0.9mm · 0.85mm/px · 3 of 88 slices shown (1 of 2)]
[im 1/88]
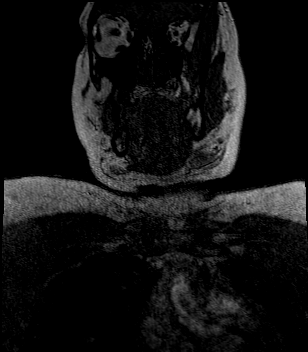
[im 44/88]
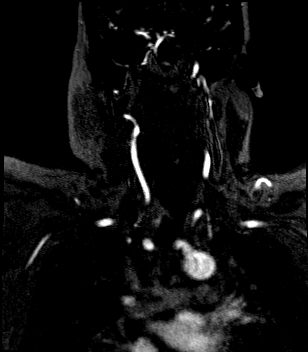
[im 88/88]
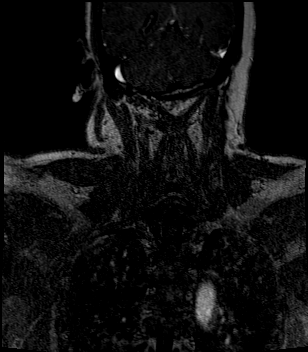

[Series 34: angio_fl3d_cor_post_ttc=3.0s_moco-adv · coronal · 0.9mm · 0.85mm/px · 3 of 88 slices shown (1 of 2)]
[im 1/88]
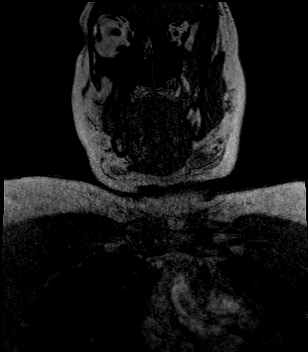
[im 44/88]
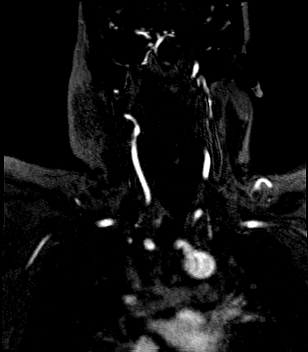
[im 88/88]
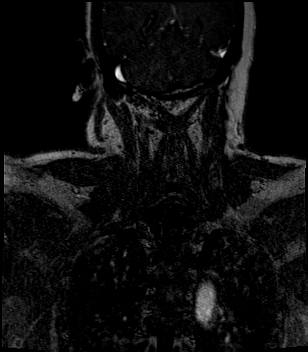

[Series 35: angio_fl3d_cor_post_ttc=3.0s_moco-adv_sub · coronal · 0.9mm · 0.85mm/px · 3 of 88 slices shown (1 of 2)]
[im 1/88]
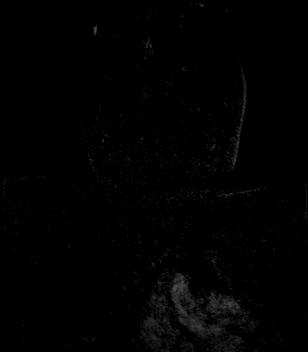
[im 44/88]
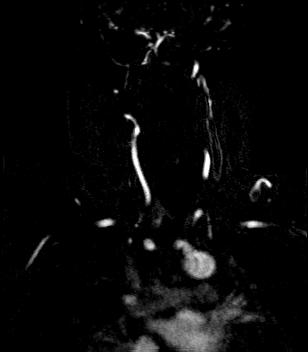
[im 88/88]
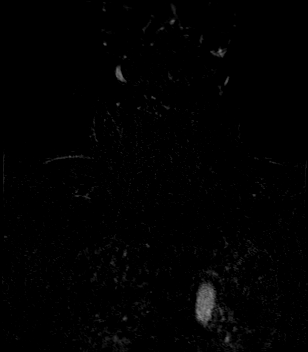

[Series 37: angio_fl3d_cor_post_ttc=3.0s · coronal · 0.9mm · 0.85mm/px · 3 of 88 slices shown (2 of 2)]
[im 1/88]
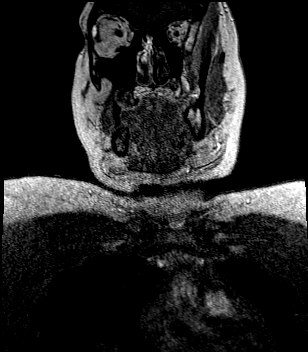
[im 44/88]
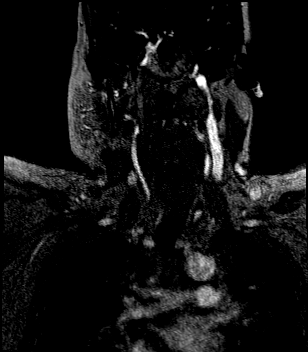
[im 88/88]
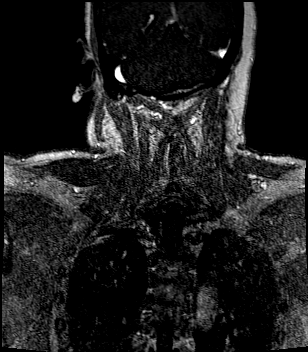

[Series 38: angio_fl3d_cor_post_ttc=3.0s_moco-adv · coronal · 0.9mm · 0.85mm/px · 3 of 88 slices shown (2 of 2)]
[im 1/88]
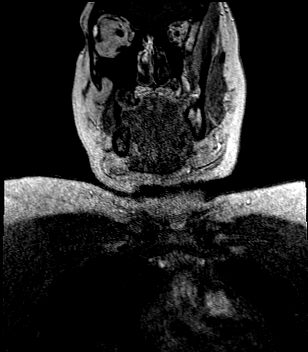
[im 44/88]
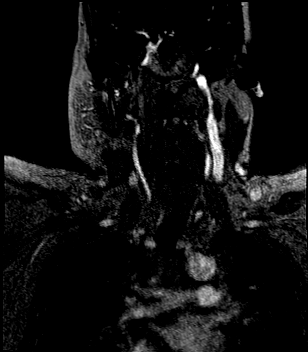
[im 88/88]
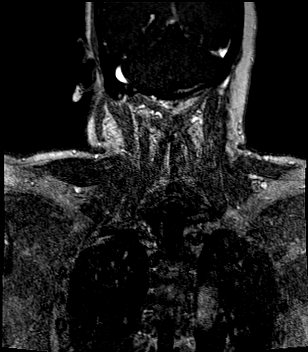

[Series 39: angio_fl3d_cor_post_ttc=3.0s_moco-adv_sub · coronal · 0.9mm · 0.85mm/px · 3 of 86 slices shown (2 of 2)]
[im 1/86]
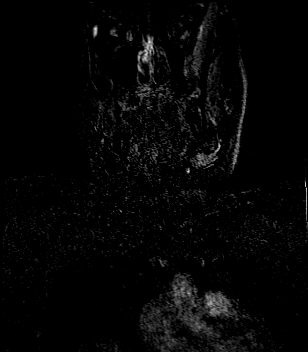
[im 43/86]
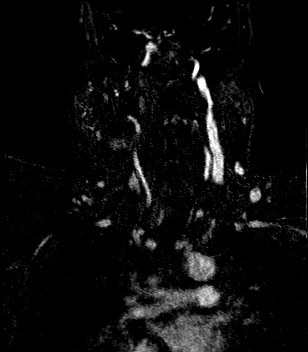
[im 86/86]
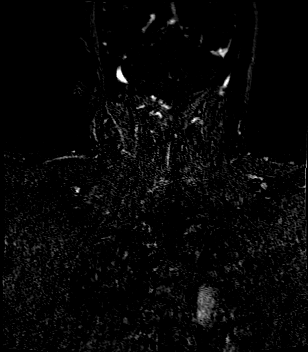

[42 of 48 positions shown; findings below may reference images not displayed]

FINDINGS: MRI HEAD FINDINGS

Brain: Negative for acute infarct. Small diffusion-weighted
hyperintensities in the frontal white matter bilaterally compatible
with T2 shine through from prior infarction. No other areas of
infarct. Negative for hemorrhage or mass. Ventricle size and
cerebral volume normal.

Vascular: Small caliber right internal carotid artery due to slow
flow from proximal stenosis. Normal flow voids left carotid and
vertebrobasilar.

Skull and upper cervical spine: Negative

Sinuses/Orbits: Negative

Other: None

MRA HEAD FINDINGS

Right internal carotid artery is patent but of small caliber to
proximal stenosis. Right anterior and middle cerebral arteries
patent

Left internal carotid artery is patent without stenosis. Left
anterior and middle cerebral arteries widely patent.

Both vertebral arteries patent to the basilar without stenosis. Left
PICA patent. Right PICA not visualized. Right AICA patent. Basilar
widely patent. Superior cerebellar and posterior cerebral arteries
patent bilaterally. Right posterior communicating artery patent mild
stenosis right P2 segment.

MRA NECK FINDINGS

Normal branching pattern. Normal aortic arch and proximal great
vessels.

Right common carotid artery widely patent. Short segment of loss of
flow related signal proximal right internal carotid artery due to
critical stenosis. Small caliber right internal carotid artery above
the stenosis. Right external carotid artery widely patent

Left common carotid artery widely patent. Approximately 50% diameter
stenosis proximal left internal carotid artery. Left external
carotid artery widely patent.

Both vertebral arteries widely patent without stenosis.
IMPRESSION: 1. Negative for acute infarct.
2. No significant intracranial stenosis or occlusion
3. Critical stenosis proximal right internal carotid artery
estimated to be greater than 80% diameter stenosis.
4. Approximately 50% diameter stenosis proximal left internal
carotid artery.
5. These results were called by telephone at the time of
interpretation on [DATE] at [DATE] to PA. QAYYUM , who
verbally acknowledged these results.

## 2018-08-18 MED ORDER — DIPHENHYDRAMINE HCL 50 MG/ML IJ SOLN
12.5000 mg | Freq: Once | INTRAMUSCULAR | Status: AC
  Administered 2018-08-18: 12.5 mg via INTRAVENOUS
  Filled 2018-08-18: qty 1

## 2018-08-18 MED ORDER — GADOBUTROL 1 MMOL/ML IV SOLN
9.0000 mL | Freq: Once | INTRAVENOUS | Status: AC | PRN
  Administered 2018-08-18: 9 mL via INTRAVENOUS

## 2018-08-18 MED ORDER — SODIUM CHLORIDE 0.9% FLUSH: 3.0000 mL | Freq: Once | INTRAVENOUS | Status: DC

## 2018-08-18 MED ORDER — LORAZEPAM 2 MG/ML IJ SOLN
0.5000 mg | Freq: Once | INTRAMUSCULAR | Status: AC
  Administered 2018-08-18: 0.5 mg via INTRAVENOUS
  Filled 2018-08-18: qty 1

## 2018-08-18 MED ORDER — METOCLOPRAMIDE HCL 5 MG/ML IJ SOLN
10.0000 mg | Freq: Once | INTRAMUSCULAR | Status: AC
  Administered 2018-08-18: 10 mg via INTRAVENOUS
  Filled 2018-08-18: qty 2

## 2018-08-18 MED ORDER — ACETAMINOPHEN 500 MG PO TABS
1000.0000 mg | ORAL_TABLET | Freq: Once | ORAL | Status: AC
  Administered 2018-08-18: 1000 mg via ORAL
  Filled 2018-08-18: qty 2

## 2018-08-18 MED ORDER — DIAZEPAM 5 MG PO TABS: 5.0000 mg | ORAL_TABLET | Freq: Once | ORAL | Status: DC

## 2018-08-18 MED ORDER — SODIUM CHLORIDE 0.9 % IV BOLUS
1000.0000 mL | Freq: Once | INTRAVENOUS | Status: AC
  Administered 2018-08-18: 1000 mL via INTRAVENOUS

## 2018-08-18 NOTE — ED Notes (Signed)
Patient Alert and oriented to baseline. Stable and ambulatory to baseline. Patient verbalized understanding of the discharge instructions.  Patient belongings were taken by the patient. Taken to ED drop-of area via wheelchair.

## 2018-08-18 NOTE — ED Notes (Signed)
Called ct.  No scan available yet, but they will call me.  Spoke with Dr Lita Mains, he does not think pt should be code stroke.

## 2018-08-18 NOTE — ED Triage Notes (Signed)
PT ambulated into ED stating she had 2 episodes of vision loss with pain in her temple - vision loss lasted 5 minutes each.   All symptoms have resolved. Hypertensive.  Cardiac cath 1 month prior.

## 2018-08-18 NOTE — Consult Note (Addendum)
Neurology Consultation Reason for Consult: Visual changes Referring Physician: Roxine Caddy  CC: Transient visual changes  History is obtained from: Patient  HPI: [name] is a 58 y.o. female who woke up this morning with a throbbing in her left ear.  She states that it lasted for little over an hour.  No real photophobia.  She then had 2 episodes around lunchtime of a zigzag line pattern eroding her vision from the left side progressing and then receding over the course of about 10 to 15 minutes.  This was associated with left temporal headache, which was relatively brief in duration both times.  She had some slowed cognition around this time as well.  She describes that she has had these types of visual changes before, but only really in the past year.  She also has had the throbbing in her left ear before, but has not associated it with the visual changes previously.  All of her symptoms are currently resolved.  She does not have a history of migraines, or headaches that make her when he lay down in a dark room.  ROS: A 14 point ROS was performed and is negative except as noted in the HPI.   Past Medical History:  Diagnosis Date  . Allergy   . Arthritis    back   . Atypical chest pain 06/30/2016  . Back pain    DUE TO INJURY AT WORK ON05/2013  . Bruises easily   . Chronic lower back pain   . Coronary artery disease   . Depression    was on meds 2 yrs ago   . Headache(784.0)    rarely  . History of bronchitis    last time about 64yr ago   . Hypertension    takes Benicar daily  . Hypothyroidism 06/30/2016  . Vitamin D deficiency    takes Vitamin D 2 times/wk  . Weakness    and numbness right foot     Family History  Problem Relation Age of Onset  . Heart disease Mother   . CAD Mother   . Stroke Mother   . Heart disease Sister   . Liver disease Brother   . Colon cancer Neg Hx   . Esophageal cancer Neg Hx   . Rectal cancer Neg Hx   . Stomach cancer Neg Hx       Social History:  reports that she has never smoked. She has never used smokeless tobacco. She reports current alcohol use of about 7.0 standard drinks of alcohol per week. She reports that she does not use drugs.   Exam: Current vital signs: BP 137/81   Pulse 77   Temp 98.1 F (36.7 C) (Oral)   Resp 18   Ht 5' 1.5" (1.562 m)   Wt 90.7 kg   LMP 06/03/2011   SpO2 96%   BMI 37.18 kg/m  Vital signs in last 24 hours: Temp:  [98.1 F (36.7 C)] 98.1 F (36.7 C) (08/07 1323) Pulse Rate:  [72-78] 77 (08/07 1645) Resp:  [14-18] 18 (08/07 1500) BP: (111-190)/(71-90) 137/81 (08/07 1645) SpO2:  [96 %-100 %] 96 % (08/07 1645) Weight:  [90.7 kg] 90.7 kg (08/07 1326)   Physical Exam  Constitutional: Appears well-developed and well-nourished.  Psych: Affect appropriate to situation Eyes: No scleral injection HENT: No OP obstrucion Head: Normocephalic.  Cardiovascular: Normal rate and regular rhythm.  Respiratory: Effort normal, non-labored breathing GI: Soft.  No distension. There is no tenderness.  Skin: WDI  Neuro: Mental Status: Patient is awake, alert, oriented to person, place, month, year, and situation. Patient is able to give a clear and coherent history. No signs of aphasia or neglect Cranial Nerves: II: Visual Fields are full. Pupils are equal, round, and reactive to light.   III,IV, VI: EOMI without ptosis or diploplia.  V: Facial sensation is symmetric to temperature VII: Facial movement is symmetric.  VIII: hearing is intact to voice X: Uvula elevates symmetrically XI: Shoulder shrug is symmetric. XII: tongue is midline without atrophy or fasciculations.  Motor: Tone is normal. Bulk is normal. 5/5 strength was present in all four extremities.  Sensory: Sensation is symmetric to light touch and temperature in the arms and legs. Deep Tendon Reflexes: 2+ and symmetric in the biceps and patellae.  Plantars: Toes are downgoing bilaterally.  Cerebellar: FNF  and HKS are intact bilaterally   I have reviewed labs in epic and the results pertinent to this consultation are: CMP-unremarkable ESR-50 CRP-normal  I have reviewed the images obtained:  CT head-negative   Impression: 58 year old female with brief transient sharp left temporal headache associated with visual aura most characteristic of migraine with longer left-sided throbbing.  The characteristics of the described pattern are very typical of migraine, and she has no other symptoms concerning for temporal arteritis.  In a review of 764 patients undergoing temporal artery biopsy, the sensitivity of CRP for temporal arteritis was 86.4%.  The specificity of ESR was 29.5%.  In this particular patient my clinical suspicion for temporal arteritis is very low and given the above numbers, I would say that the relatively high sensitivity of CRP coupled with low clinical suspicion should argue against further evaluation for temporal arteritis at this time.  Though the symptoms are most characteristic of migraine, given that she does not have a clear history and is relatively old for new onset migraines, I do think further imaging with MRI would be reasonable.  Recommendations: 1) MRI brain/MRA of the head and neck 2) if negative, no further evaluation is needed.  Roland Rack, MD [facility] [phone]  If 7pm- 7am, please page neurology on call as listed in [place].    [1] Semin Arthritis Rheum. 2012 Jun; 41(6): 056-979.

## 2018-08-18 NOTE — ED Provider Notes (Signed)
[facility] EMERGENCY DEPARTMENT Provider Note   CSN: [id number] Arrival date & time: 08/18/18  1314     History   Chief Complaint Chief Complaint  Patient presents with   Loss of Vision    intermittent    HPI [name] is a 58 y.o. female.     HPI  Patient is a 58 year old female the past medical history of coronary artery disease status post stent placement in July 2020 on dual antiplatelet therapy, hypertension, hypothyroidism, occasional headaches presenting for left-sided temporal headache, left-sided blurred vision described as "zig zag" lines across her vision, episode of word finding difficulty.  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  The second episode lasted about 2 minutes.  She has had intermittent sharp left-sided temporal pain but no further visual disturbance.  No persistent symptoms.  She denies any current weakness, numbness, facial drooping, word finding difficulty, dysarthria.  She denies any nausea or vomiting.  She did have some left-sided arm "cramping" overnight as well as some sharp leg cramping on the left leg, but none currently.  Past Medical History:  Diagnosis Date   Allergy    Arthritis    back    Atypical chest pain 06/30/2016   Back pain    DUE TO INJURY AT WORK ON05/2013   Bruises easily    Chronic lower back pain    Coronary artery disease    Depression    was on meds 2 yrs ago    Headache(784.0)    rarely   History of bronchitis    last time about 36yr ago    Hypertension    takes Benicar daily   Hypothyroidism 06/30/2016   Vitamin D deficiency    takes Vitamin D 2 times/wk   Weakness    and numbness right foot    Patient Active Problem List   Diagnosis Date Noted   [place]    Chest pain 07/10/2018   Hyperglycemia 07/10/2018   Abnormal stress test 08/06/2016   Essential hypertension 06/30/2016    Hypothyroidism 06/30/2016   Atypical chest pain 06/30/2016    Past Surgical History:  Procedure Laterality Date   BACK SURGERY     CARPAL TUNNEL RELEASE Right    [facility]  "took out fatty tumor" (11/09/2012)   LEFT HEART CATH AND CORONARY ANGIOGRAPHY N/A 08/06/2016   Procedure: Left Heart Cath and Coronary Angiography;  Surgeon: SBelva Crome MD;  [facility];  Service: Cardiovascular;  Laterality: N/A;   LEFT HEART CATH AND CORONARY ANGIOGRAPHY N/A 07/12/2018   Procedure: LEFT HEART CATH AND CORONARY ANGIOGRAPHY;  Surgeon: HLeonie Man MD;  [facility];  Service: Cardiovascular;  Laterality: N/A;   LUMBAR LAMINECTOMY/DECOMPRESSION MICRODISCECTOMY  11/09/2012   "L3-4" (11/09/2012)   LUMBAR LAMINECTOMY/DECOMPRESSION MICRODISCECTOMY N/A 11/09/2012   Procedure: L3-L4 DECOMPRESSION AND MICRODISCECTOMY   (1 LEVEL);  Surgeon: DMelina Schools MD;  [place] [facility]  Service: Orthopedics;  Laterality: N/A;     OB History   No obstetric history on file.      Home Medications    Prior to Admission medications   Medication Sig Start Date End Date Taking? Authorizing Provider  acetaminophen (TYLENOL) 500 MG tablet Take 1,000 mg by mouth every 6 (six) hours as needed for headache (pain).   Yes [provider]  aspirin EC 81 MG EC tablet Take 1 tablet (81 mg total) by mouth daily. 07/14/18  Yes Geradine Girt, DO  atorvastatin (LIPITOR) 80 MG tablet Take 1 tablet (80 mg total) by mouth daily at 6 PM. 07/13/18  Yes Eliseo Squires, Jessica U, DO  carvedilol (COREG) 6.25 MG tablet Take 1 tablet (6.25 mg total) by mouth 2 (two) times daily with a meal. 07/13/18  Yes Eulogio Bear U, DO  clopidogrel (PLAVIX) 75 MG tablet Take 1 tablet (75 mg total) by mouth daily with breakfast. 07/14/18  Yes Vann, Jessica U, DO  cyclobenzaprine (FLEXERIL) 10 MG tablet TAKE 1 TABLET (10 MG TOTAL) BY MOUTH DAILY AS  NEEDED FOR MUSCLE SPASMS. 08/09/18  Yes Glendale Chard, MD  fluticasone [facility]) 50 MCG/ACT nasal spray Place 1 spray into both nostrils daily as needed for allergies or rhinitis.  07/13/18  Yes [provider]  levocetirizine (XYZAL) 5 MG tablet TAKE 1 TABLET BY MOUTH EVERY DAY Patient taking differently: Take 5 mg by mouth daily.  07/21/18  Yes Glendale Chard, MD  nitroGLYCERIN (NITROSTAT) 0.4 MG SL tablet Place 1 tablet (0.4 mg total) under the tongue every 5 (five) minutes as needed for chest pain. 07/13/18  Yes Vann, Jessica U, DO  olmesartan-hydrochlorothiazide (BENICAR HCT) 40-25 MG tablet TAKE 1 TABLET BY MOUTH EVERY DAY Patient taking differently: Take 1 tablet by mouth daily.  06/14/18  Yes Glendale Chard, MD  traMADol (ULTRAM) 50 MG tablet Take 1 tablet (50 mg total) by mouth every 6 (six) hours as needed. Patient taking differently: Take 50 mg by mouth every 6 (six) hours as needed (pain).  07/24/18  Yes Lendon Colonel, NP  VITAMIN D PO Take 1 tablet by mouth daily.   Yes [provider]    Family History Family History  Problem Relation Age of Onset   Heart disease Mother    CAD Mother    Stroke Mother    Heart disease Sister    Liver disease Brother    Colon cancer Neg Hx    Esophageal cancer Neg Hx    Rectal cancer Neg Hx    Stomach cancer Neg Hx     Social History Social History   Tobacco Use   Smoking status: Never Smoker   Smokeless tobacco: Never Used  Substance Use Topics   Alcohol use: Yes    Alcohol/week: 7.0 standard drinks    Types: 7 Glasses of wine per week    Comment: social   Drug use: No     Allergies   Patient has no known allergies.   Review of Systems Review of Systems  Constitutional: Negative for chills and fever.  HENT: Negative for congestion and sore throat.   Eyes: Positive for visual disturbance.  Respiratory: Negative for cough, chest tightness and shortness of breath.   Cardiovascular: Negative for  chest pain, palpitations and leg swelling.  Gastrointestinal: Negative for abdominal pain, nausea and vomiting.  Genitourinary: Negative for dysuria and flank pain.  Musculoskeletal: Negative for back pain and myalgias.  Skin: Negative for rash.  Neurological: Positive for headaches. Negative for dizziness, syncope and light-headedness.     Physical Exam Updated Vital Signs BP (!) 157/84 (BP Location: Right Arm)    Pulse 73    Temp 98.1 F (36.7 C) (Oral)    Resp 14    Ht 5' 1.5" (1.562 m)    Wt 90.7 kg    LMP 06/03/2011    SpO2 100%  BMI 37.18 kg/m   Physical Exam Vitals signs and nursing note reviewed.  Constitutional:      General: She is not in acute distress.    Appearance: She is well-developed.  HENT:     Head: Normocephalic and atraumatic.     Comments: No pain over temporal arteries.     Mouth/Throat:     Mouth: Mucous membranes are moist.  Eyes:     Conjunctiva/sclera: Conjunctivae normal.     Pupils: Pupils are equal, round, and reactive to light.  Neck:     Musculoskeletal: Normal range of motion and neck supple.  Cardiovascular:     Rate and Rhythm: Normal rate and regular rhythm.     Pulses:          Radial pulses are 2+ on the right side and 2+ on the left side.       Dorsalis pedis pulses are 2+ on the right side and 2+ on the left side.     Heart sounds: S1 normal and S2 normal. No murmur.     Comments: No LE edema. No calf tenderness. Pulmonary:     Effort: Pulmonary effort is normal.     Breath sounds: Normal breath sounds. No wheezing or rales.  Abdominal:     General: There is no distension.     Palpations: Abdomen is soft.     Tenderness: There is no abdominal tenderness. There is no guarding.  Musculoskeletal: Normal range of motion.        General: No deformity.  Lymphadenopathy:     Cervical: No cervical adenopathy.  Skin:    General: Skin is warm and dry.     Findings: No erythema or rash.  Neurological:     Mental Status: She is alert.       Comments: Mental Status:  Alert, oriented, thought content appropriate, able to give a coherent history. Speech fluent without evidence of aphasia. Able to follow 2 step commands without difficulty.  Cranial Nerves:  II:  Peripheral visual fields grossly normal, pupils equal, round, reactive to light III,IV, VI: ptosis not present, extra-ocular motions intact bilaterally  V,VII: smile symmetric, facial light touch sensation equal VIII: hearing grossly normal to voice  X: uvula elevates symmetrically  XI: bilateral shoulder shrug symmetric and strong XII: midline tongue extension without fassiculations Motor:  Normal tone. 5/5 in upper and lower extremities bilaterally including strong and equal grip strength and dorsiflexion/plantar flexion Sensory: Pinprick and light touch normal in all extremities.  Cerebellar: normal finger-to-nose with bilateral upper extremities Gait: normal gait and balance Stance: No pronator drift and good coordination, strength, and position sense with tapping of bilateral arms (performed in sitting position). CV: distal pulses palpable throughout    Psychiatric:        Behavior: Behavior normal.        Thought Content: Thought content normal.        Judgment: Judgment normal.      ED Treatments / Results  Labs (all labs ordered are listed, but only abnormal results are displayed) Labs Reviewed  CBC - Abnormal; Notable for the following components:      Result Value   Hemoglobin 11.9 (*)    All other components within normal limits  COMPREHENSIVE METABOLIC PANEL - Abnormal; Notable for the following components:   Total Protein 8.2 (*)    All other components within normal limits  SEDIMENTATION RATE - Abnormal; Notable for the following components:   Sed Rate 50 (*)  All other components within normal limits  PROTIME-INR  APTT  DIFFERENTIAL  C-REACTIVE PROTEIN  I-STAT CHEM 8, ED  CBG MONITORING, ED  I-STAT BETA HCG BLOOD, ED ([facility], [facility], [facility]  ONLY)  TROPONIN I (HIGH SENSITIVITY)  TROPONIN I (HIGH SENSITIVITY)    EKG EKG Interpretation  Date/Time:  Friday August 18 2018 13:22:03 EDT Ventricular Rate:  78 PR Interval:  118 QRS Duration: 72 QT Interval:  352 QTC Calculation: 401 R Axis:   36 Text Interpretation:  Sinus rhythm with occasional Premature ventricular complexes Otherwise normal ECG since last tracing no significant change Confirmed by Malvin Johns [phone]) on 08/18/2018 2:44:14 PM   Radiology Ct Head Wo Contrast  Result Date: 08/18/2018 CLINICAL DATA:  Left-sided headache and dizziness. EXAM: CT HEAD WITHOUT CONTRAST TECHNIQUE: Contiguous axial images were obtained from the base of the skull through the vertex without intravenous contrast. COMPARISON:  12/22/2016 FINDINGS: Brain: The brain shows a normal appearance without evidence of malformation, atrophy, old or acute small or large vessel infarction, mass lesion, hemorrhage, hydrocephalus or extra-axial collection. Vascular: No hyperdense vessel. No evidence of atherosclerotic calcification. Skull: Normal.  No traumatic finding.  No focal bone lesion. Sinuses/Orbits: Sinuses are clear. Orbits appear normal. Mastoids are clear. Other: None significant IMPRESSION: Normal examination. No abnormality seen to explain the clinical presentation. Electronically Signed   By: Nelson Chimes M.D.   On: 08/18/2018 14:25   Mr Angio Head Wo Contrast  Result Date: 08/18/2018 CLINICAL DATA:  Focal neuro deficit greater than 6 hours. Suspect stroke. Left-sided headache and vision change. EXAM: MRI HEAD WITHOUT   CONTRAST MRA HEAD WITHOUT CONTRAST MRA NECK WITHOUT AND WITH CONTRAST TECHNIQUE: Multiplanar, multiecho pulse sequences of the brain and surrounding structures were obtained without intravenous contrast. Angiographic images of the Circle of Willis were obtained using MRA technique without intravenous contrast. Angiographic images of the neck were obtained using MRA technique  without and with intravenous contrast. Carotid stenosis measurements (when applicable) are obtained utilizing NASCET criteria, using the distal internal carotid diameter as the denominator. CONTRAST:  9 mL Gadovist IV COMPARISON:  CT head 08/18/2018 FINDINGS: MRI HEAD FINDINGS Brain: Negative for acute infarct. Small diffusion-weighted hyperintensities in the frontal white matter bilaterally compatible with T2 shine through from prior infarction. No other areas of infarct. Negative for hemorrhage or mass. Ventricle size and cerebral volume normal. Vascular: Small caliber right internal carotid artery due to slow flow from proximal stenosis. Normal flow voids left carotid and vertebrobasilar. Skull and upper cervical spine: Negative Sinuses/Orbits: Negative Other: None MRA HEAD FINDINGS Right internal carotid artery is patent but of small caliber to proximal stenosis. Right anterior and middle cerebral arteries patent Left internal carotid artery is patent without stenosis. Left anterior and middle cerebral arteries widely patent. Both vertebral arteries patent to the basilar without stenosis. Left PICA patent. Right PICA not visualized. Right AICA patent. Basilar widely patent. Superior cerebellar and posterior cerebral arteries patent bilaterally. Right posterior communicating artery patent mild stenosis right P2 segment. MRA NECK FINDINGS Normal branching pattern. Normal aortic arch and proximal great vessels. Right common carotid artery widely patent. Short segment of loss of flow related signal proximal right internal carotid artery due to critical stenosis. Small caliber right internal carotid artery above the stenosis. Right external carotid artery widely patent Left common carotid artery widely patent. Approximately 50% diameter stenosis proximal left internal carotid artery. Left external carotid artery widely patent. Both vertebral arteries widely patent without stenosis. IMPRESSION: 1. Negative for acute  infarct. 2. No significant intracranial stenosis or occlusion 3. Critical stenosis proximal right internal carotid artery estimated to be greater than 80% diameter stenosis. 4. Approximately 50% diameter stenosis proximal left internal carotid artery. 5. These results were called by telephone at the time of interpretation on 08/18/2018 at 8:51 pm to [place] , who verbally acknowledged these results. Electronically Signed   By: Franchot Gallo M.D.   On: 08/18/2018 20:55   Mr Angio Neck W Wo Contrast  Result Date: 08/18/2018 CLINICAL DATA:  Focal neuro deficit greater than 6 hours. Suspect stroke. Left-sided headache and vision change. EXAM: MRI HEAD WITHOUT   CONTRAST MRA HEAD WITHOUT CONTRAST MRA NECK WITHOUT AND WITH CONTRAST TECHNIQUE: Multiplanar, multiecho pulse sequences of the brain and surrounding structures were obtained without intravenous contrast. Angiographic images of the Circle of Willis were obtained using MRA technique without intravenous contrast. Angiographic images of the neck were obtained using MRA technique without and with intravenous contrast. Carotid stenosis measurements (when applicable) are obtained utilizing NASCET criteria, using the distal internal carotid diameter as the denominator. CONTRAST:  9 mL Gadovist IV COMPARISON:  CT head 08/18/2018 FINDINGS: MRI HEAD FINDINGS Brain: Negative for acute infarct. Small diffusion-weighted hyperintensities in the frontal white matter bilaterally compatible with T2 shine through from prior infarction. No other areas of infarct. Negative for hemorrhage or mass. Ventricle size and cerebral volume normal. Vascular: Small caliber right internal carotid artery due to slow flow from proximal stenosis. Normal flow voids left carotid and vertebrobasilar. Skull and upper cervical spine: Negative Sinuses/Orbits: Negative Other: None MRA HEAD FINDINGS Right internal carotid artery is patent but of small caliber to proximal stenosis. Right  anterior and middle cerebral arteries patent Left internal carotid artery is patent without stenosis. Left anterior and middle cerebral arteries widely patent. Both vertebral arteries patent to the basilar without stenosis. Left PICA patent. Right PICA not visualized. Right AICA patent. Basilar widely patent. Superior cerebellar and posterior cerebral arteries patent bilaterally. Right posterior communicating artery patent mild stenosis right P2 segment. MRA NECK FINDINGS Normal branching pattern. Normal aortic arch and proximal great vessels. Right common carotid artery widely patent. Short segment of loss of flow related signal proximal right internal carotid artery due to critical stenosis. Small caliber right internal carotid artery above the stenosis. Right external carotid artery widely patent Left common carotid artery widely patent. Approximately 50% diameter stenosis proximal left internal carotid artery. Left external carotid artery widely patent. Both vertebral arteries widely patent without stenosis. IMPRESSION: 1. Negative for acute infarct. 2. No significant intracranial stenosis or occlusion 3. Critical stenosis proximal right internal carotid artery estimated to be greater than 80% diameter stenosis. 4. Approximately 50% diameter stenosis proximal left internal carotid artery. 5. These results were called by telephone at the time of interpretation on 08/18/2018 at 8:51 pm to [place] , who verbally acknowledged these results. Electronically Signed   By: Franchot Gallo M.D.   On: 08/18/2018 20:55   Mr Brain Wo Contrast  Result Date: 08/18/2018 CLINICAL DATA:  Focal neuro deficit greater than 6 hours. Suspect stroke. Left-sided headache and vision change. EXAM: MRI HEAD WITHOUT   CONTRAST MRA HEAD WITHOUT CONTRAST MRA NECK WITHOUT AND WITH CONTRAST TECHNIQUE: Multiplanar, multiecho pulse sequences of the brain and surrounding structures were obtained without intravenous contrast. Angiographic  images of the Circle of Willis were obtained using MRA technique without intravenous contrast. Angiographic images of the neck were obtained using MRA technique without and with intravenous contrast.  Carotid stenosis measurements (when applicable) are obtained utilizing NASCET criteria, using the distal internal carotid diameter as the denominator. CONTRAST:  9 mL Gadovist IV COMPARISON:  CT head 08/18/2018 FINDINGS: MRI HEAD FINDINGS Brain: Negative for acute infarct. Small diffusion-weighted hyperintensities in the frontal white matter bilaterally compatible with T2 shine through from prior infarction. No other areas of infarct. Negative for hemorrhage or mass. Ventricle size and cerebral volume normal. Vascular: Small caliber right internal carotid artery due to slow flow from proximal stenosis. Normal flow voids left carotid and vertebrobasilar. Skull and upper cervical spine: Negative Sinuses/Orbits: Negative Other: None MRA HEAD FINDINGS Right internal carotid artery is patent but of small caliber to proximal stenosis. Right anterior and middle cerebral arteries patent Left internal carotid artery is patent without stenosis. Left anterior and middle cerebral arteries widely patent. Both vertebral arteries patent to the basilar without stenosis. Left PICA patent. Right PICA not visualized. Right AICA patent. Basilar widely patent. Superior cerebellar and posterior cerebral arteries patent bilaterally. Right posterior communicating artery patent mild stenosis right P2 segment. MRA NECK FINDINGS Normal branching pattern. Normal aortic arch and proximal great vessels. Right common carotid artery widely patent. Short segment of loss of flow related signal proximal right internal carotid artery due to critical stenosis. Small caliber right internal carotid artery above the stenosis. Right external carotid artery widely patent Left common carotid artery widely patent. Approximately 50% diameter stenosis proximal left  internal carotid artery. Left external carotid artery widely patent. Both vertebral arteries widely patent without stenosis. IMPRESSION: 1. Negative for acute infarct. 2. No significant intracranial stenosis or occlusion 3. Critical stenosis proximal right internal carotid artery estimated to be greater than 80% diameter stenosis. 4. Approximately 50% diameter stenosis proximal left internal carotid artery. 5. These results were called by telephone at the time of interpretation on 08/18/2018 at 8:51 pm to [place] , who verbally acknowledged these results. Electronically Signed   By: Franchot Gallo M.D.   On: 08/18/2018 20:55    Procedures Procedures (including critical care time)  Medications Ordered in ED Medications  sodium chloride flush (NS) 0.9 % injection 3 mL (has no administration in time range)     Initial Impression / Assessment and Plan / ED Course  I have reviewed the triage vital signs and the nursing notes.  Pertinent labs & imaging results that were available during my care of the patient were reviewed by me and considered in my medical decision making (see chart for details).  Clinical Course as of Aug 17 2120  Fri Aug 18, 2018  1539 BP(!): 157/84 [AM]  1629 Dr. Leonel Ramsay to come evaluate.    [AM]  1648 Dr. Leonel Ramsay evaluated the patient at bedside and recommends MR brain, MRA head/neck. Appreciate his involvement.    [AM]  4680 CRP: <0.8 [AM]  2052 Spoke with Dr. Carlis Abbott of radiology who states that pt has critical right ICA stenosis and 50% left ICA stenosis. Small chronic infarcts but no acute infarcts.    [AM]  2056 Received call from Dr. Lorraine Lax who reviewed patient's MRI.  States that findings of right 80% internal carotid stenosis incidental and would not be contributory to patient's symptoms today.  He recommends the patient have close follow-up with a stroke neurologist at [facility].  Patient is already on dual antiplatelet therapy.   Patient has no further symptoms and Dr. Lorraine Lax agrees symptoms atypical for GCA.  Appreciate his involvement.   [AM]  2116 Equivocal. Symptoms atypical for  GCA and neurology personally evaluated the patient and agrees.   Sed Rate(!): 50 [AM]  2121 Patient verbally verified a safe ride from the ED. Proceeded with prescribing Ativan for relaxation/claustrophobia in the ED.   [AM]    Clinical Course User Index [AM] Albesa Seen, PA-C       This is a well-appearing and neurologically intact 58 year old female with past medical history ofcoronary artery disease status post stent placement in July 2020 on dual antiplatelet therapy, hypertension, hypothyroidism, occasional headaches presenting for left-sided temporal headache, left-sided blurred vision described as "zig zag" lines across her vision, episode of word finding difficulty.  The symptoms have completely resolved by the time she is evaluated emergency department.  Patient not felt to be meeting criteria for code stroke.  Neurology was consulted who recommended MRI brain, MRA head and neck as well as ESR CRP assessing for temporal arteritis.  Clinically, not consistent with temporal arteritis this patient is not having any active symptoms.  Patient was personally evaluated by neurology who feels symptoms are more consistent with migraine given the number of positive symptoms she has as opposed to negative.  CBC remarkable for stable anemia at 11.9.  CMP unremarkable.  ESR not elevated.  Sed rate 50.  In the setting of no ongoing symptoms, do not feel that sed rate is indicative of GCA.  CT head without contrast unremarkable.  MRI MRA showing chronic infarcts but no acute or subacute infarcts.  These are all small vessel.  She has 80% right internal carotid stenosis and 50% left internal carotid stenosis.  She will need close follow-up with this per neurology.  This is established for patient with an ambulatory referral placed.  Patient  remained asymptomatic and blood pressure normalized in the emergency department.  Patient was instructed that if she develops any neurologic signs or symptoms such as visual disturbance, facial weakness, speech disturbance, weakness or numbness in extremities, vertigo, she should be evaluated in the emergency department immediately.  Patient is understanding and agrees with plan of care.  This is a supervised visit with Dr. Malvin Johns. Evaluation, management, and discharge planning discussed with this attending physician.  Final Clinical Impressions(s) / ED Diagnoses   Final diagnoses:  Left-sided headache  Visual disturbance    ED Discharge Orders         Ordered    Ambulatory referral to Neurology    Comments: An appointment is requested in approximately: 1 week   08/18/18 2123           Tamala Julian 08/18/18 2125    Malvin Johns, MD 08/31/18 [id number]

## 2018-08-18 NOTE — ED Notes (Signed)
Patient transported to X-ray 

## 2018-08-18 NOTE — Discharge Instructions (Signed)
Please see the information and instructions below regarding your visit.  Your diagnoses today include:  1. Left-sided headache   2. Visual disturbance     Tests performed today include: See side panel of your discharge paperwork for testing performed today. Vital signs are listed at the bottom of these instructions.   Medications prescribed:    Take any prescribed medications only as prescribed, and any over the counter medications only as directed on the packaging.  Continue all home medications.  Home care instructions:  Please follow any educational materials contained in this packet.   Follow-up instructions: Please follow-up with [facility].   Return instructions:  Please return to the Emergency Department if you experience worsening symptoms.  Back to the emergency department if you develop any facial weakness, facial numbness, disturbance of speech, weakness or numbness in extremities, feeling off balance or that the room is spinning, or severe headaches. Please return if you have any other emergent concerns.  Additional Information:   Your vital signs today were: BP 114/65    Pulse 87    Temp 98.1 F (36.7 C) (Oral)    Resp (!) 26    Ht 5' 1.5" (1.562 m)    Wt 90.7 kg    LMP 06/03/2011    SpO2 100%    BMI 37.18 kg/m  If your blood pressure (BP) was elevated on multiple readings during this visit above 130 for the top number or above 80 for the bottom number, please have this repeated by your primary care provider within one month. --------------  Thank you for allowing [place] to participate in your care today.

## 2018-08-21 ENCOUNTER — Other Ambulatory Visit: Payer: Self-pay

## 2018-08-21 ENCOUNTER — Telehealth (HOSPITAL_COMMUNITY): Payer: Self-pay | Admitting: *Deleted

## 2018-08-21 ENCOUNTER — Encounter (HOSPITAL_COMMUNITY)
Admission: RE | Admit: 2018-08-21 | Discharge: 2018-08-21 | Disposition: A | Discharge status: Home / Self Care | Payer: BLUE CROSS/BLUE SHIELD | Source: Ambulatory Visit | Attending: Cardiovascular Disease | Admitting: Cardiovascular Disease

## 2018-08-21 ENCOUNTER — Telehealth: Payer: Self-pay

## 2018-08-21 NOTE — Telephone Encounter (Signed)
Didn't hear back from pt regarding getting set up on the [employer] for virtual cardiac rehab. Pt stated that she ended up in the hospital on this past Friday.  Pt presented to the ER with sudden lost of vision x 2 and pain in temporal area.  Pt ruled out for stroke and MI.  Presumptive of Migraine although pt does not have a history of this.  Pt sent home.  Residual headache over the weekend.  Pt is to follow up with [facility].  Referral reviewed in epic.  Will hold on completing pt exercise care plan until seen by neurology and plan of care if any is determine.  Pt verbalized understanding. Cherre Huger, BSN Cardiac and Training and development officer

## 2018-08-21 NOTE — Telephone Encounter (Signed)
LEFT MESSAGE. PT NEEDS ER FOLLOW UP APPT

## 2018-08-22 ENCOUNTER — Telehealth: Payer: Self-pay

## 2018-08-22 NOTE — Telephone Encounter (Signed)
I called the pt to schedule her an appt for an ER follow-up.  The pt declined to see one of the other providers and the next available appt with Dr. Baird Cancer was to close to the pt's appt for her physical for September 3rd so the pt said she will wait until her appt to see Dr sanders so that she can have her physical and so she doesn't have so many appts creating bills.

## 2018-08-23 ENCOUNTER — Ambulatory Visit: Payer: BLUE CROSS/BLUE SHIELD | Admitting: Neurology

## 2018-08-23 ENCOUNTER — Encounter: Payer: Self-pay | Admitting: Neurology

## 2018-08-23 ENCOUNTER — Telehealth: Payer: Self-pay | Admitting: Neurology

## 2018-08-23 ENCOUNTER — Other Ambulatory Visit: Payer: Self-pay

## 2018-08-23 VITALS — BP 152/92 | HR 92 | Temp 96.8°F | Ht 61.5 in | Wt 206.5 lb

## 2018-08-23 DIAGNOSIS — G43109 Migraine with aura, not intractable, without status migrainosus: Secondary | ICD-10-CM

## 2018-08-23 MED ORDER — CAMBIA 50 MG PO PACK: 50.0000 mg | PACK | ORAL | 11 refills | Status: DC | PRN

## 2018-08-23 MED ORDER — TOPIRAMATE 100 MG PO TABS: 200.0000 mg | ORAL_TABLET | Freq: Every day | ORAL | 11 refills | Status: DC

## 2018-08-23 NOTE — Telephone Encounter (Signed)
I have talked with patient about ultrasound of carotid artery

## 2018-08-23 NOTE — Progress Notes (Signed)
PATIENT: [name] DOB: 1960/05/06  Chief Complaint  Patient presents with  . Headache    She presented to the ED, on 08/18/2018, after having two episodes of left-sided headache associated with vision loss.  She underwent extensive testing while there.  Says she was told it was a migraine.  Her vision has restored to normal.  However, she has had one further headach, with nausea, since discharge.  She took Extra Strength Tylenol that was not helpful.  She had to lay down to rest and the pain resolved in about one hour.   Marland Kitchen PCP    Glendale Chard, MD (referred from hospital)     HISTORICAL  [name] is a 57 year old female, seen in request by her primary care physician Dr. Baird Cancer, [place] Mech for episode of visual loss, headaches, initial evaluation was on August 23, 2018.  I have reviewed and summarized the referring note from the referring physician.  She had past medical history of hyperlipidemia, hypertension, coronary artery disease, status post stent on July 12, 2018, currently taking aspirin and Plavix  She denies a previous history of migraine headaches, on August 18, 2018, while sitting at the computer, she noticed difficulty reading, then bilateral vision went out, she was able to lie down with eyes closed resting for 30 minutes, her vision came back normal, at the same time she noticed mild left temporal region throbbing headaches, when she went back to look at computer again, she experienced visual loss bilaterally again, was sent to the emergency room  I personally reviewed MRI of the brain no acute abnormality  MRA of neck: 50% stenosis of left internal carotid artery, critical stenosis of proximal right internal carotid artery, estimated to be greater than 80% stenosis, 50% stenosis of the left internal carotid artery  MRA of the brain showed no significant large vessel stenosis intracranially  She experienced a severe headache on August 08/2018, severe holoacranial  pounding headache with associated light noise sensitivity, nauseous that was helped by Tylenol  Laboratory evaluations in 2020, LDL 148, cholesterol 217, negative C-reactive protein, ESR was mildly elevated 50, UDS was negative, normal TSH, A1c 5.9   REVIEW OF SYSTEMS: Full 14 system review of systems performed and notable only for as above All other review of systems were negative.  ALLERGIES: No Known Allergies  HOME MEDICATIONS: Current Outpatient Medications  Medication Sig Dispense Refill  . acetaminophen (TYLENOL) 500 MG tablet Take 1,000 mg by mouth every 6 (six) hours as needed for headache (pain).    Marland Kitchen aspirin EC 81 MG EC tablet Take 1 tablet (81 mg total) by mouth daily.    Marland Kitchen atorvastatin (LIPITOR) 80 MG tablet Take 1 tablet (80 mg total) by mouth daily at 6 PM. 30 tablet 1  . carvedilol (COREG) 6.25 MG tablet Take 1 tablet (6.25 mg total) by mouth 2 (two) times daily with a meal. 60 tablet 1  . clopidogrel (PLAVIX) 75 MG tablet Take 1 tablet (75 mg total) by mouth daily with breakfast. 30 tablet 1  . cyclobenzaprine (FLEXERIL) 10 MG tablet TAKE 1 TABLET (10 MG TOTAL) BY MOUTH DAILY AS NEEDED FOR MUSCLE SPASMS. 30 tablet 0  . fluticasone (FLONASE) 50 MCG/ACT nasal spray Place 1 spray into both nostrils daily as needed for allergies or rhinitis.     Marland Kitchen levocetirizine (XYZAL) 5 MG tablet TAKE 1 TABLET BY MOUTH EVERY DAY (Patient taking differently: Take 5 mg by mouth daily. ) 90 tablet 1  . nitroGLYCERIN (  NITROSTAT) 0.4 MG SL tablet Place 1 tablet (0.4 mg total) under the tongue every 5 (five) minutes as needed for chest pain. 30 tablet 1  . olmesartan-hydrochlorothiazide (BENICAR HCT) 40-25 MG tablet TAKE 1 TABLET BY MOUTH EVERY DAY (Patient taking differently: Take 1 tablet by mouth daily. ) 90 tablet 0  . traMADol (ULTRAM) 50 MG tablet Take 1 tablet (50 mg total) by mouth every 6 (six) hours as needed. (Patient taking differently: Take 50 mg by mouth every 6 (six) hours as needed  (pain). ) 20 tablet 0  . VITAMIN D PO Take 1 tablet by mouth daily.     No current facility-administered medications for this visit.     PAST MEDICAL HISTORY: Past Medical History:  Diagnosis Date  . Allergy   . Arthritis    back   . Atypical chest pain 06/30/2016  . Back pain    DUE TO INJURY AT WORK ON05/2013  . Bruises easily   . Chronic lower back pain   . Coronary artery disease   . Depression    was on meds 2 yrs ago   . Headache(784.0)    rarely  . History of bronchitis    last time about 12yr ago   . Hypertension    takes Benicar daily  . Hypothyroidism 06/30/2016  . Vitamin D deficiency    takes Vitamin D 2 times/wk  . Weakness    and numbness right foot    PAST SURGICAL HISTORY: Past Surgical History:  Procedure Laterality Date  . BACK SURGERY    . CARPAL TUNNEL RELEASE Right   . CESAREAN SECTION  1988  . COLONOSCOPY    . EXPLORATORY LAPAROTOMY  1991   "took out fatty tumor" (11/09/2012)  . LEFT HEART CATH AND CORONARY ANGIOGRAPHY N/A 08/06/2016   Procedure: Left Heart Cath and Coronary Angiography;  Surgeon: SBelva Crome MD;  [facility];  Service: Cardiovascular;  Laterality: N/A;  . LEFT HEART CATH AND CORONARY ANGIOGRAPHY N/A 07/12/2018   Procedure: LEFT HEART CATH AND CORONARY ANGIOGRAPHY;  Surgeon: HLeonie Man MD;  [facility];  Service: Cardiovascular;  Laterality: N/A;  . LUMBAR LAMINECTOMY/DECOMPRESSION MICRODISCECTOMY  11/09/2012   "L3-4" (11/09/2012)  . LUMBAR LAMINECTOMY/DECOMPRESSION MICRODISCECTOMY N/A 11/09/2012   Procedure: L3-L4 DECOMPRESSION AND MICRODISCECTOMY   (1 LEVEL);  Surgeon: DMelina Schools MD;  [facility]  Service: Orthopedics;  Laterality: N/A;    FAMILY HISTORY: Family History  Problem Relation Age of Onset  . Heart disease Mother   . CAD Mother   . Stroke Mother   . Heart disease Sister   . Liver disease Brother   . Other Father        unknown medical history  . Colon  cancer Neg Hx   . Esophageal cancer Neg Hx   . Rectal cancer Neg Hx   . Stomach cancer Neg Hx     SOCIAL HISTORY: Social History   Socioeconomic History  . Marital status: Divorced    Spouse name: Not on file  . Number of children: 1  . Years of education: Not on file  . Highest education level: Some college, no degree  Occupational History  . Occupation: DSports administrator Social Needs  . Financial resource strain: Not on file  . Food insecurity    Worry: Not on file    Inability: Not on file  . Transportation needs    Medical: Not on file  Non-medical: Not on file  Tobacco Use  . Smoking status: Never Smoker  . Smokeless tobacco: Never Used  Substance and Sexual Activity  . Alcohol use: Yes    Alcohol/week: 7.0 standard drinks    Types: 7 Glasses of wine per week    Comment: social  . Drug use: No  . Sexual activity: Yes    Birth control/protection: Post-menopausal  Lifestyle  . Physical activity    Days per week: Not on file    Minutes per session: Not on file  . Stress: Not on file  Relationships  . Social Herbalist on phone: Not on file    Gets together: Not on file    Attends religious service: Not on file    Active member of club or organization: Not on file    Attends meetings of clubs or organizations: Not on file    Relationship status: Not on file  . Intimate partner violence    Fear of current or ex partner: Not on file    Emotionally abused: Not on file    Physically abused: Not on file    Forced sexual activity: Not on file  Other Topics Concern  . Not on file  Social History Narrative   Right-handed.   Occasional caffeine.   Lives alone.     PHYSICAL EXAM   Vitals:   08/23/18 0829  BP: (!) 152/92  Pulse: 92  Temp: (!) 96.8 F (36 C)  Weight: 206 lb 8 oz (93.7 kg)  Height: 5' 1.5" (1.562 m)    Not recorded      Body mass index is 38.39 kg/m.  PHYSICAL EXAMNIATION:  Gen: NAD, conversant, well  nourised, obese, well groomed                     Cardiovascular: Regular rate rhythm, no peripheral edema, warm, nontender. Eyes: Conjunctivae clear without exudates or hemorrhage Neck: Supple, no carotid bruits. Pulmonary: Clear to auscultation bilaterally   NEUROLOGICAL EXAM:  MENTAL STATUS: Speech:    Speech is normal; fluent and spontaneous with normal comprehension.  Cognition:     Orientation to time, place and person     Normal recent and remote memory     Normal Attention span and concentration     Normal Language, naming, repeating,spontaneous speech     Fund of knowledge   CRANIAL NERVES: CN II: Visual fields are full to confrontation.  Pupils are round equal and briskly reactive to light. CN III, IV, VI: extraocular movement are normal. No ptosis. CN V: Facial sensation is intact to pinprick in all 3 divisions bilaterally. Corneal responses are intact.  CN VII: Face is symmetric with normal eye closure and smile. CN VIII: Hearing is normal to rubbing fingers CN IX, X: Palate elevates symmetrically. Phonation is normal. CN XI: Head turning and shoulder shrug are intact CN XII: Tongue is midline with normal movements and no atrophy.  MOTOR: There is no pronator drift of out-stretched arms. Muscle bulk and tone are normal. Muscle strength is normal.  REFLEXES: Reflexes are 2+ and symmetric at the biceps, triceps, knees, and ankles. Plantar responses are flexor.  SENSORY: Intact to light touch, pinprick, positional sensation and vibratory sensation are intact in fingers and toes.  COORDINATION: Rapid alternating movements and fine finger movements are intact. There is no dysmetria on finger-to-nose and heel-knee-shin.    GAIT/STANCE: Posture is normal. Gait is steady with normal steps, base, arm swing, and turning.  Heel and toe walking are normal. Tandem gait is normal.  Romberg is absent.   DIAGNOSTIC DATA (LABS, IMAGING, TESTING) - I reviewed patient records,  labs, notes, testing and imaging myself where available.   ASSESSMENT AND PLAN  [name] is a 58 y.o. female   Bilateral carotid artery disease  Right side high-grade stenosis MRA, I have discussed with vascular neurologist Dr. Leonie Man, suggested ultrasound of carotid artery  Keep aspirin and Plavix for now  Tight control of blood pressure less than 130 out of 80, LDL less than 100 Headache with migraine features  Topamax 100 mg titrating to 200 mg every night as migraine prevention  [place] as needed   Marcial Pacas, M.D. Ph.D.  [facility] [address] [place], [place] [address] Ph: [phone] Fax: [phone]  CC: Glendale Chard, MD

## 2018-08-24 ENCOUNTER — Other Ambulatory Visit: Payer: Self-pay

## 2018-08-29 ENCOUNTER — Other Ambulatory Visit: Payer: Self-pay | Admitting: Adult Health

## 2018-08-31 ENCOUNTER — Ambulatory Visit (HOSPITAL_COMMUNITY): Payer: BLUE CROSS/BLUE SHIELD

## 2018-08-31 ENCOUNTER — Other Ambulatory Visit: Payer: Self-pay | Admitting: Adult Health

## 2018-09-04 ENCOUNTER — Other Ambulatory Visit: Payer: Self-pay | Admitting: Internal Medicine

## 2018-09-04 NOTE — Telephone Encounter (Signed)
Pharmacy notified->PCP

## 2018-09-12 ENCOUNTER — Other Ambulatory Visit: Payer: Self-pay

## 2018-09-12 MED ORDER — CARVEDILOL 6.25 MG PO TABS: 6.2500 mg | ORAL_TABLET | Freq: Two times a day (BID) | ORAL | 1 refills | Status: DC

## 2018-09-12 MED ORDER — ATORVASTATIN CALCIUM 80 MG PO TABS: 80.0000 mg | ORAL_TABLET | Freq: Every day | ORAL | 1 refills | Status: DC

## 2018-09-12 MED ORDER — CLOPIDOGREL BISULFATE 75 MG PO TABS: 75.0000 mg | ORAL_TABLET | Freq: Every day | ORAL | 1 refills | Status: DC

## 2018-09-14 ENCOUNTER — Other Ambulatory Visit: Payer: Self-pay

## 2018-09-14 ENCOUNTER — Ambulatory Visit (INDEPENDENT_AMBULATORY_CARE_PROVIDER_SITE_OTHER): Payer: BLUE CROSS/BLUE SHIELD | Admitting: Internal Medicine

## 2018-09-14 ENCOUNTER — Encounter: Payer: Self-pay | Admitting: Internal Medicine

## 2018-09-14 VITALS — BP 120/72 | HR 80 | Temp 98.3°F | Ht 60.8 in | Wt 202.4 lb

## 2018-09-14 DIAGNOSIS — Z23 Encounter for immunization: Secondary | ICD-10-CM

## 2018-09-14 DIAGNOSIS — Z0000 Encounter for general adult medical examination without abnormal findings: Secondary | ICD-10-CM

## 2018-09-14 DIAGNOSIS — I10 Essential (primary) hypertension: Secondary | ICD-10-CM

## 2018-09-14 DIAGNOSIS — M5416 Radiculopathy, lumbar region: Secondary | ICD-10-CM

## 2018-09-14 LAB — POCT URINALYSIS DIPSTICK
Bilirubin, UA: NEGATIVE
Glucose, UA: NEGATIVE
Ketones, UA: NEGATIVE
Nitrite, UA: NEGATIVE
Protein, UA: NEGATIVE
Spec Grav, UA: 1.02 (ref 1.010–1.025)
Urobilinogen, UA: 0.2 E.U./dL
pH, UA: 5.5 (ref 5.0–8.0)

## 2018-09-14 LAB — POCT UA - MICROALBUMIN
Creatinine, POC: 300 mg/dL
Microalbumin Ur, POC: 80 mg/L

## 2018-09-14 MED ORDER — KETOROLAC TROMETHAMINE 60 MG/2ML IM SOLN
60.0000 mg | Freq: Once | INTRAMUSCULAR | Status: AC
  Administered 2018-09-14: 60 mg via INTRAMUSCULAR

## 2018-09-14 MED ORDER — TRAMADOL HCL 50 MG PO TABS: 50.0000 mg | ORAL_TABLET | Freq: Four times a day (QID) | ORAL | 0 refills | Status: DC | PRN

## 2018-09-14 NOTE — Patient Instructions (Signed)
Health Maintenance, Female Adopting a healthy lifestyle and getting preventive care are important in promoting health and wellness. Ask your health care provider about:  The right schedule for you to have regular tests and exams.  Things you can do on your own to prevent diseases and keep yourself healthy. What should I know about diet, weight, and exercise? Eat a healthy diet   Eat a diet that includes plenty of vegetables, fruits, low-fat dairy products, and lean protein.  Do not eat a lot of foods that are high in solid fats, added sugars, or sodium. Maintain a healthy weight Body mass index (BMI) is used to identify weight problems. It estimates body fat based on height and weight. Your health care provider can help determine your BMI and help you achieve or maintain a healthy weight. Get regular exercise Get regular exercise. This is one of the most important things you can do for your health. Most adults should:  Exercise for at least 150 minutes each week. The exercise should increase your heart rate and make you sweat (moderate-intensity exercise).  Do strengthening exercises at least twice a week. This is in addition to the moderate-intensity exercise.  Spend less time sitting. Even light physical activity can be beneficial. Watch cholesterol and blood lipids Have your blood tested for lipids and cholesterol at 58 years of age, then have this test every 5 years. Have your cholesterol levels checked more often if:  Your lipid or cholesterol levels are high.  You are older than 58 years of age.  You are at high risk for heart disease. What should I know about cancer screening? Depending on your health history and family history, you may need to have cancer screening at various ages. This may include screening for:  Breast cancer.  Cervical cancer.  Colorectal cancer.  Skin cancer.  Lung cancer. What should I know about heart disease, diabetes, and high blood  pressure? Blood pressure and heart disease  High blood pressure causes heart disease and increases the risk of stroke. This is more likely to develop in people who have high blood pressure readings, are of African descent, or are overweight.  Have your blood pressure checked: ? Every 3-5 years if you are 18-39 years of age. ? Every year if you are 40 years old or older. Diabetes Have regular diabetes screenings. This checks your fasting blood sugar level. Have the screening done:  Once every three years after age 40 if you are at a normal weight and have a low risk for diabetes.  More often and at a younger age if you are overweight or have a high risk for diabetes. What should I know about preventing infection? Hepatitis B If you have a higher risk for hepatitis B, you should be screened for this virus. Talk with your health care provider to find out if you are at risk for hepatitis B infection. Hepatitis C Testing is recommended for:  Everyone born from 1945 through 1965.  Anyone with known risk factors for hepatitis C. Sexually transmitted infections (STIs)  Get screened for STIs, including gonorrhea and chlamydia, if: ? You are sexually active and are younger than 58 years of age. ? You are older than 58 years of age and your health care provider tells you that you are at risk for this type of infection. ? Your sexual activity has changed since you were last screened, and you are at increased risk for chlamydia or gonorrhea. Ask your health care provider if   you are at risk.  Ask your health care provider about whether you are at high risk for HIV. Your health care provider may recommend a prescription medicine to help prevent HIV infection. If you choose to take medicine to prevent HIV, you should first get tested for HIV. You should then be tested every 3 months for as long as you are taking the medicine. Pregnancy  If you are about to stop having your period (premenopausal) and  you may become pregnant, seek counseling before you get pregnant.  Take 400 to 800 micrograms (mcg) of folic acid every day if you become pregnant.  Ask for birth control (contraception) if you want to prevent pregnancy. Osteoporosis and menopause Osteoporosis is a disease in which the bones lose minerals and strength with aging. This can result in bone fractures. If you are 65 years old or older, or if you are at risk for osteoporosis and fractures, ask your health care provider if you should:  Be screened for bone loss.  Take a calcium or vitamin D supplement to lower your risk of fractures.  Be given hormone replacement therapy (HRT) to treat symptoms of menopause. Follow these instructions at home: Lifestyle  Do not use any products that contain nicotine or tobacco, such as cigarettes, e-cigarettes, and chewing tobacco. If you need help quitting, ask your health care provider.  Do not use street drugs.  Do not share needles.  Ask your health care provider for help if you need support or information about quitting drugs. Alcohol use  Do not drink alcohol if: ? Your health care provider tells you not to drink. ? You are pregnant, may be pregnant, or are planning to become pregnant.  If you drink alcohol: ? Limit how much you use to 0-1 drink a day. ? Limit intake if you are breastfeeding.  Be aware of how much alcohol is in your drink. In the [place] one drink equals one 12 oz bottle of beer (355 mL), one 5 oz glass of wine (148 mL), or one 1 oz glass of hard liquor (44 mL). General instructions  Schedule regular health, dental, and eye exams.  Stay current with your vaccines.  Tell your health care provider if: ? You often feel depressed. ? You have ever been abused or do not feel safe at home. Summary  Adopting a healthy lifestyle and getting preventive care are important in promoting health and wellness.  Follow your health care provider's instructions about healthy  diet, exercising, and getting tested or screened for diseases.  Follow your health care provider's instructions on monitoring your cholesterol and blood pressure. This information is not intended to replace advice given to you by your health care provider. Make sure you discuss any questions you have with your health care provider. Document Released: 07/13/2010 Document Revised: 12/21/2017 Document Reviewed: 12/21/2017 Elsevier Patient Education  2020 [employer]  

## 2018-09-14 NOTE — Progress Notes (Signed)
Subjective:     Patient ID: [name] , female    DOB: 02-03-1960 , 58 y.o.   MRN: [id number]   Chief Complaint  Patient presents with  . Annual Exam  . Hypertension    HPI  She is here today for a full physical examination. She is followed by GYN for her pelvic examinations. She c/o left leg pain that has worsened over past several days. She has h/o lumbar radiculopathy. She has been followed by Ortho in the past.   Hypertension This is a chronic problem. The current episode started more than 1 year ago. The problem has been gradually improving since onset. The problem is controlled. Pertinent negatives include no blurred vision, chest pain, palpitations or shortness of breath. Risk factors for coronary artery disease include obesity, post-menopausal state, sedentary lifestyle and stress. Past treatments include angiotensin blockers, diuretics and beta blockers. The current treatment provides moderate improvement.     Past Medical History:  Diagnosis Date  . Allergy   . Arthritis    back   . Atypical chest pain 06/30/2016  . Back pain    DUE TO INJURY AT WORK ON05/2013  . Bruises easily   . Chronic lower back pain   . Coronary artery disease   . Depression    was on meds 2 yrs ago   . Headache(784.0)    rarely  . History of bronchitis    last time about 59yr ago   . Hypertension    takes Benicar daily  . Hypothyroidism 06/30/2016  . Vitamin D deficiency    takes Vitamin D 2 times/wk  . Weakness    and numbness right foot     Family History  Problem Relation Age of Onset  . Heart disease Mother   . CAD Mother   . Stroke Mother   . Heart disease Sister   . Liver disease Brother   . Other Father        unknown medical history  . Colon cancer Neg Hx   . Esophageal cancer Neg Hx   . Rectal cancer Neg Hx   . Stomach cancer Neg Hx      Current Outpatient Medications:  .  acetaminophen (TYLENOL) 500 MG tablet, Take 1,000 mg by mouth every 6 (six) hours as  needed for headache (pain)., Disp: , Rfl:  .  aspirin EC 81 MG EC tablet, Take 1 tablet (81 mg total) by mouth daily., Disp:  , Rfl:  .  atorvastatin (LIPITOR) 80 MG tablet, Take 1 tablet (80 mg total) by mouth daily at 6 PM., Disp: 30 tablet, Rfl: 1 .  clopidogrel (PLAVIX) 75 MG tablet, Take 1 tablet (75 mg total) by mouth daily with breakfast., Disp: 30 tablet, Rfl: 1 .  cyclobenzaprine (FLEXERIL) 10 MG tablet, TAKE 1 TABLET (10 MG TOTAL) BY MOUTH DAILY AS NEEDED FOR MUSCLE SPASMS., Disp: 30 tablet, Rfl: 0 .  fluticasone (FLONASE) 50 MCG/ACT nasal spray, Place 1 spray into both nostrils daily as needed for allergies or rhinitis. , Disp: , Rfl:  .  levocetirizine (XYZAL) 5 MG tablet, TAKE 1 TABLET BY MOUTH EVERY DAY (Patient taking differently: Take 5 mg by mouth daily. ), Disp: 90 tablet, Rfl: 1 .  nitroGLYCERIN (NITROSTAT) 0.4 MG SL tablet, Place 1 tablet (0.4 mg total) under the tongue every 5 (five) minutes as needed for chest pain., Disp: 30 tablet, Rfl: 1 .  olmesartan-hydrochlorothiazide (BENICAR HCT) 40-25 MG tablet, TAKE 1 TABLET BY MOUTH EVERY DAY (  Patient taking differently: Take 1 tablet by mouth daily. ), Disp: 90 tablet, Rfl: 0 .  traMADol (ULTRAM) 50 MG tablet, Take 1 tablet (50 mg total) by mouth every 6 (six) hours as needed., Disp: 30 tablet, Rfl: 0 .  carvedilol (COREG) 6.25 MG tablet, Take 1 tablet (6.25 mg total) by mouth 2 (two) times daily with a meal. (Patient not taking: Reported on 09/14/2018), Disp: 60 tablet, Rfl: 1 .  Diclofenac Potassium (CAMBIA) 50 MG PACK, Take 50 mg by mouth as needed. (Patient not taking: Reported on 09/14/2018), Disp: 12 each, Rfl: 11 .  topiramate (TOPAMAX) 100 MG tablet, Take 2 tablets (200 mg total) by mouth at bedtime. (Patient not taking: Reported on 09/14/2018), Disp: 60 tablet, Rfl: 11 .  VITAMIN D PO, Take 1 tablet by mouth daily., Disp: , Rfl:    No Known Allergies    The patient states she uses post menopausal status for birth control. Last  LMP was Patient's last menstrual period was 06/03/2011.. Negative for Dysmenorrhea  Negative for: breast discharge, breast lump(s), breast pain and breast self exam. Associated symptoms include abnormal vaginal bleeding. Pertinent negatives include abnormal bleeding (hematology), anxiety, decreased libido, depression, difficulty falling sleep, dyspareunia, history of infertility, nocturia, sexual dysfunction, sleep disturbances, urinary incontinence, urinary urgency, vaginal discharge and vaginal itching. Diet regular.The patient states her exercise level is  intermittent.  . The patient's tobacco use is:  Social History   Tobacco Use  Smoking Status Never Smoker  Smokeless Tobacco Never Used  . She has been exposed to passive smoke. The patient's alcohol use is:  Social History   Substance and Sexual Activity  Alcohol Use Yes  . Alcohol/week: 7.0 standard drinks  . Types: 7 Glasses of wine per week   Comment: social    Review of Systems  Constitutional: Negative.   HENT: Negative.   Eyes: Negative.  Negative for blurred vision.  Respiratory: Negative.  Negative for shortness of breath.   Cardiovascular: Negative.  Negative for chest pain and palpitations.  Endocrine: Negative.   Genitourinary: Negative.   Musculoskeletal: Positive for arthralgias and back pain.  Skin: Negative.   Allergic/Immunologic: Negative.   Neurological: Negative.   Hematological: Negative.   Psychiatric/Behavioral: Negative.      Today's Vitals   09/14/18 1000  BP: 120/72  Pulse: 80  Temp: 98.3 F (36.8 C)  TempSrc: Oral  SpO2: 98%  Weight: 202 lb 6.4 oz (91.8 kg)  Height: 5' 0.8" (1.544 m)   Body mass index is 38.5 kg/m.   Objective:  Physical Exam Vitals signs and nursing note reviewed.  Constitutional:      Appearance: Normal appearance. She is obese.  HENT:     Head: Normocephalic and atraumatic.     Right Ear: Tympanic membrane, ear canal and external ear normal.     Left Ear:  Tympanic membrane, ear canal and external ear normal.     Nose: Nose normal.     Mouth/Throat:     Mouth: Mucous membranes are moist.     Pharynx: Oropharynx is clear.  Eyes:     Extraocular Movements: Extraocular movements intact.     Conjunctiva/sclera: Conjunctivae normal.     Pupils: Pupils are equal, round, and reactive to light.  Neck:     Musculoskeletal: Normal range of motion and neck supple.  Cardiovascular:     Rate and Rhythm: Normal rate and regular rhythm.     Pulses: Normal pulses.     Heart sounds: Normal heart  sounds.  Pulmonary:     Effort: Pulmonary effort is normal.     Breath sounds: Normal breath sounds.  Chest:     Breasts: Tanner Score is 5.        Right: Normal. No swelling, bleeding, inverted nipple, mass, nipple discharge or skin change.        Left: No swelling, bleeding, inverted nipple, mass, nipple discharge or skin change.  Abdominal:     General: Bowel sounds are normal.     Palpations: Abdomen is soft.     Comments: obese  Genitourinary:    Comments: deferred Musculoskeletal: Normal range of motion.     Lumbar back: She exhibits tenderness.  Skin:    General: Skin is warm and dry.  Neurological:     General: No focal deficit present.     Mental Status: She is alert and oriented to person, place, and time.  Psychiatric:        Mood and Affect: Mood normal.        Behavior: Behavior normal.         Assessment And Plan:     1. Routine general medical examination at health care facility  A full exam was performed.  Importance of monthly self breast exams was discussed with the patient.  PATIENT HAS BEEN ADVISED TO GET 30-45 MINUTES REGULAR EXERCISE NO LESS THAN FOUR TO FIVE DAYS PER WEEK - BOTH WEIGHTBEARING EXERCISES AND AEROBIC ARE RECOMMENDED.  SHE WAS ADVISED TO FOLLOW A HEALTHY DIET WITH AT LEAST SIX FRUITS/VEGGIES PER DAY, DECREASE INTAKE OF RED MEAT, AND TO INCREASE FISH INTAKE TO TWO DAYS PER WEEK.  MEATS/FISH SHOULD NOT BE FRIED,  BAKED OR BROILED IS PREFERABLE.  I SUGGEST WEARING SPF 50 SUNSCREEN ON EXPOSED PARTS AND ESPECIALLY WHEN IN THE DIRECT SUNLIGHT FOR AN EXTENDED PERIOD OF TIME.  PLEASE AVOID FAST FOOD RESTAURANTS AND INCREASE YOUR WATER INTAKE.  - EKG 12-Lead - CMP14+EGFR - CBC - Lipid panel - Hemoglobin A1c - POCT Urinalysis Dipstick (81002) - POCT UA - Microalbumin  2. Essential hypertension, benign  Chronic, well controlled. She will continue with current meds. She is encouraged to avoid adding salt to her foods. EKG performed, no acute changes noted. Importance of regular exercise is also stressed to the patient.   - POCT Urinalysis Dipstick (81002) - POCT UA - Microalbumin  3. Chronic lumbar radiculopathy  She now has acute flare. She was given Toradol, 34m IM x 1. She was also given rx tramadol to use prn. Review of the [place] CSRS was performed in [place] of the [place] any controlled drugs.  - ketorolac (TORADOL) injection 60 mg  4. Flu vaccine need  She was given flu vaccine to update her immunization history.   RMaximino Greenland MD    THE PATIENT IS ENCOURAGED TO PRACTICE SOCIAL DISTANCING DUE TO THE COVID-19 PANDEMIC.

## 2018-09-15 LAB — CBC
Hematocrit: 36.5 % (ref 34.0–46.6)
Hemoglobin: 11.7 g/dL (ref 11.1–15.9)
MCH: 28.5 pg (ref 26.6–33.0)
MCHC: 32.1 g/dL (ref 31.5–35.7)
MCV: 89 fL (ref 79–97)
Platelets: 343 10*3/uL (ref 150–450)
RBC: 4.1 x10E6/uL (ref 3.77–5.28)
RDW: 13.1 % (ref 11.7–15.4)
WBC: 8 10*3/uL (ref 3.4–10.8)

## 2018-09-15 LAB — CMP14+EGFR
ALT: 23 IU/L (ref 0–32)
AST: 23 IU/L (ref 0–40)
Albumin/Globulin Ratio: 1.7 (ref 1.2–2.2)
Albumin: 4.8 g/dL (ref 3.8–4.9)
Alkaline Phosphatase: 95 IU/L (ref 39–117)
BUN/Creatinine Ratio: 19 (ref 9–23)
BUN: 17 mg/dL (ref 6–24)
Bilirubin Total: 0.8 mg/dL (ref 0.0–1.2)
CO2: 24 mmol/L (ref 20–29)
Calcium: 10.1 mg/dL (ref 8.7–10.2)
Chloride: 101 mmol/L (ref 96–106)
Creatinine, Ser: 0.89 mg/dL (ref 0.57–1.00)
GFR calc Af Amer: 83 mL/min/{1.73_m2} (ref >59)
GFR calc non Af Amer: 72 mL/min/{1.73_m2} (ref >59)
Globulin, Total: 2.8 g/dL (ref 1.5–4.5)
Glucose: 111 mg/dL — ABNORMAL HIGH (ref 65–99)
Potassium: 4.6 mmol/L (ref 3.5–5.2)
Sodium: 141 mmol/L (ref 134–144)
Total Protein: 7.6 g/dL (ref 6.0–8.5)

## 2018-09-15 LAB — LIPID PANEL
Chol/HDL Ratio: 3.1 ratio (ref 0.0–4.4)
Cholesterol, Total: 138 mg/dL (ref 100–199)
HDL: 44 mg/dL (ref >39)
LDL Chol Calc (NIH): 77 mg/dL (ref 0–99)
Triglycerides: 90 mg/dL (ref 0–149)
VLDL Cholesterol Cal: 17 mg/dL (ref 5–40)

## 2018-09-15 LAB — HEMOGLOBIN A1C
Est. average glucose Bld gHb Est-mCnc: 137 mg/dL
Hgb A1c MFr Bld: 6.4 % — ABNORMAL HIGH (ref 4.8–5.6)

## 2018-09-19 ENCOUNTER — Telehealth: Payer: Self-pay

## 2018-09-19 NOTE — Telephone Encounter (Signed)
-----   Message from Glendale Chard, MD sent at 09/15/2018  9:14 AM EDT ----- Your liver and kidney fxn are nl. Blood sugar elevated - were you fasting? Your blood count is nl. Cholesterol looks great. Your hba1c is 6.4. At 6.5, one is considered diabetic. I suggest you avoid ALL white bread, rice and pasta. Remove all sodas and juices from your diet. It is also important that you exercise no less than five days weekly for at least 30 minutes. We will recheck at your next visit.

## 2018-09-19 NOTE — Telephone Encounter (Signed)
1st attempt to give lab results  

## 2018-10-04 ENCOUNTER — Other Ambulatory Visit: Payer: Self-pay | Admitting: Adult Health

## 2018-11-18 ENCOUNTER — Other Ambulatory Visit: Payer: Self-pay | Admitting: Internal Medicine

## 2018-11-27 NOTE — Progress Notes (Deleted)
PATIENT: [name] DOB: 02-Dec-1960  REASON FOR VISIT: follow up Rebekah FROM: patient  Rebekah OF PRESENT ILLNESS: Today 11/27/18  [name] is a 58 year old female, seen in request by her primary care physician Dr. Baird Wagner, [name] for episode of visual loss, headaches, initial evaluation was on August 23, 2018.  I have reviewed and summarized the referring note from the referring physician.  She had [name] of hyperlipidemia, hypertension, coronary artery disease, status post stent on July 12, 2018, currently taking aspirin and Plavix  She denies a previous Rebekah of migraine headaches, on August 18, 2018, while sitting at the computer, she noticed difficulty reading, then bilateral vision went out, she was able to lie down with eyes closed resting for 30 minutes, her vision came back normal, at the same time she noticed mild left temporal region throbbing headaches, when she went back to look at computer again, she experienced visual loss bilaterally again, was sent to the emergency room  I personally reviewed MRI of the brain no acute abnormality  MRA of neck: 50% stenosis of left internal carotid artery, critical stenosis of proximal right internal carotid artery, estimated to be greater than 80% stenosis, 50% stenosis of the left internal carotid artery  MRA of the brain showed no significant large vessel stenosis intracranially  She experienced a severe headache on August 08/2018, severe holoacranial pounding headache with associated light noise sensitivity, nauseous that was helped by Tylenol  Laboratory evaluations in 2020, LDL 148, cholesterol 217, negative C-reactive protein, ESR was mildly elevated 50, UDS was negative, normal TSH, A1c 5.9  Update November 28, 2018 SS:    REVIEW OF SYSTEMS: Out of a complete 14 system review of symptoms, the patient complains only of the following symptoms, and all other reviewed systems are negative.   ALLERGIES: No Known Allergies  HOME MEDICATIONS: Outpatient Medications Prior to Visit  Medication Sig Dispense Refill  . acetaminophen (TYLENOL) 500 MG tablet Take 1,000 mg by mouth every 6 (six) hours as needed for headache (pain).    Marland Kitchen aspirin EC 81 MG EC tablet Take 1 tablet (81 mg total) by mouth daily.    Marland Kitchen atorvastatin (LIPITOR) 80 MG tablet TAKE 1 TABLET (80 MG TOTAL) BY MOUTH DAILY AT 6 PM. 90 tablet 1  . carvedilol (COREG) 6.25 MG tablet TAKE 1 TABLET (6.25 MG TOTAL) BY MOUTH 2 (TWO) TIMES DAILY WITH A MEAL. 90 tablet 1  . clopidogrel (PLAVIX) 75 MG tablet TAKE 1 TABLET (75 MG TOTAL) BY MOUTH DAILY WITH BREAKFAST. 90 tablet 1  . cyclobenzaprine (FLEXERIL) 10 MG tablet TAKE 1 TABLET (10 MG TOTAL) BY MOUTH DAILY AS NEEDED FOR MUSCLE SPASMS. 30 tablet 0  . Diclofenac Potassium (CAMBIA) 50 MG PACK Take 50 mg by mouth as needed. (Patient not taking: Reported on 09/14/2018) 12 each 11  . fluticasone (FLONASE) 50 MCG/ACT nasal spray Place 1 spray into both nostrils daily as needed for allergies or rhinitis.     Marland Kitchen levocetirizine (XYZAL) 5 MG tablet TAKE 1 TABLET BY MOUTH EVERY DAY (Patient taking differently: Take 5 mg by mouth daily. ) 90 tablet 1  . nitroGLYCERIN (NITROSTAT) 0.4 MG SL tablet Place 1 tablet (0.4 mg total) under the tongue every 5 (five) minutes as needed for chest pain. 30 tablet 1  . olmesartan-hydrochlorothiazide (BENICAR HCT) 40-25 MG tablet TAKE 1 TABLET BY MOUTH EVERY DAY 90 tablet 0  . topiramate (TOPAMAX) 100 MG tablet Take 2 tablets (200 mg  total) by mouth at bedtime. (Patient not taking: Reported on 09/14/2018) 60 tablet 11  . traMADol (ULTRAM) 50 MG tablet Take 1 tablet (50 mg total) by mouth every 6 (six) hours as needed. 30 tablet 0  . VITAMIN D PO Take 1 tablet by mouth daily.     No facility-administered medications prior to visit.     [name]: [name]:  Diagnosis Date  . Allergy   . Arthritis    back   . Atypical chest pain  06/30/2016  . Back pain    DUE TO INJURY AT WORK ON05/2013  . Bruises easily   . Chronic lower back pain   . Coronary artery disease   . Depression    was on meds 2 yrs ago   . Headache(784.0)    rarely  . Rebekah of bronchitis    last time about 70yr ago   . Hypertension    takes Benicar daily  . Hypothyroidism 06/30/2016  . Vitamin D deficiency    takes Vitamin D 2 times/wk  . Weakness    and numbness right foot    [name]: [name]:  Procedure Laterality Date  . BACK SURGERY    . CARPAL TUNNEL RELEASE Right   . CESAREAN SECTION  1988  . COLONOSCOPY    . EXPLORATORY LAPAROTOMY  1991   "took out fatty tumor" (11/09/2012)  . LEFT HEART CATH AND CORONARY ANGIOGRAPHY N/A 08/06/2016   Procedure: Left Heart Cath and Coronary Angiography;  Surgeon: SBelva Crome MD;  [facility];  Service: Cardiovascular;  Laterality: N/A;  . LEFT HEART CATH AND CORONARY ANGIOGRAPHY N/A 07/12/2018   Procedure: LEFT HEART CATH AND CORONARY ANGIOGRAPHY;  Surgeon: HLeonie Man MD;  [facility];  Service: Cardiovascular;  Laterality: N/A;  . LUMBAR LAMINECTOMY/DECOMPRESSION MICRODISCECTOMY  11/09/2012   "L3-4" (11/09/2012)  . LUMBAR LAMINECTOMY/DECOMPRESSION MICRODISCECTOMY N/A 11/09/2012   Procedure: L3-L4 DECOMPRESSION AND MICRODISCECTOMY   (1 LEVEL);  Surgeon: DMelina Schools MD;  [facility]  Service: Orthopedics;  Laterality: N/A;    [name]: [name]  Problem Relation Age of Onset  . Heart disease Mother   . CAD Mother   . Stroke Mother   . Heart disease Sister   . Liver disease Brother   . Other Father        [name]  . Colon Wagner Neg Hx   . Esophageal Wagner Neg Hx   . Rectal Wagner Neg Hx   . Stomach Wagner Neg Hx     [name]: [name]   Socioeconomic Rebekah  . Marital status: Divorced    Spouse name: Not on file  . Number of children: 1  . Years of education:  Not on file  . Highest education level: Some college, no degree  Occupational Rebekah  . Occupation: DSports administrator Social Needs  . Financial resource strain: Not on file  . Food insecurity    Worry: Not on file    Inability: Not on file  . Transportation needs    Medical: Not on file    Non-medical: Not on file  Tobacco Use  . Smoking status: Never Smoker  . Smokeless tobacco: Never Used  Substance and Sexual Activity  . Alcohol use: Yes    Alcohol/week: 7.0 standard drinks    Types: 7 Glasses of wine per week    Comment: social  . Drug use: No  .  Sexual activity: Yes    Birth control/protection: Post-menopausal  Lifestyle  . Physical activity    Days per week: Not on file    Minutes per session: Not on file  . Stress: Not on file  Relationships  . Social Herbalist on phone: Not on file    Gets together: Not on file    Attends religious service: Not on file    Active member of club or organization: Not on file    Attends meetings of clubs or organizations: Not on file    Relationship status: Not on file  . Intimate partner violence    Fear of current or ex partner: Not on file    Emotionally abused: Not on file    Physically abused: Not on file    Forced sexual activity: Not on file  Other Topics Concern  . Not on file  [name] Narrative   Right-handed.   Occasional caffeine.   Lives alone.      PHYSICAL EXAM  There were no vitals filed for this visit. There is no height or weight on file to calculate BMI.  Generalized: Well developed, in no acute distress   Neurological examination  Mentation: Alert oriented to time, place, Rebekah taking. Follows all commands speech and language fluent Cranial nerve II-XII: Pupils were equal round reactive to light. Extraocular movements were full, visual field were full on confrontational test. Facial sensation and strength were normal. Uvula tongue midline. Head turning and shoulder  shrug  were normal and symmetric. Motor: The motor testing reveals 5 over 5 strength of all 4 extremities. Good symmetric motor tone is noted throughout.  Sensory: Sensory testing is intact to soft touch on all 4 extremities. No evidence of extinction is noted.  Coordination: Cerebellar testing reveals good finger-nose-finger and heel-to-shin bilaterally.  Gait and station: Gait is normal. Tandem gait is normal. Romberg is negative. No drift is seen.  Reflexes: Deep tendon reflexes are symmetric and normal bilaterally.   DIAGNOSTIC DATA (LABS, IMAGING, TESTING) - I reviewed patient records, labs, notes, testing and imaging myself where available.  Lab Results  Component Value Date   WBC 8.0 09/14/2018   HGB 11.7 09/14/2018   HCT 36.5 09/14/2018   MCV 89 09/14/2018   PLT 343 09/14/2018      Component Value Date/Time   NA 141 09/14/2018 1039   K 4.6 09/14/2018 1039   CL 101 09/14/2018 1039   CO2 24 09/14/2018 1039   GLUCOSE 111 (H) 09/14/2018 1039   GLUCOSE 94 08/18/2018 1339   BUN 17 09/14/2018 1039   CREATININE 0.89 09/14/2018 1039   CALCIUM 10.1 09/14/2018 1039   PROT 7.6 09/14/2018 1039   ALBUMIN 4.8 09/14/2018 1039   AST 23 09/14/2018 1039   ALT 23 09/14/2018 1039   ALKPHOS 95 09/14/2018 1039   BILITOT 0.8 09/14/2018 1039   GFRNONAA 72 09/14/2018 1039   GFRAA 83 09/14/2018 1039   Lab Results  Component Value Date   CHOL 138 09/14/2018   HDL 44 09/14/2018   LDLCALC 77 09/14/2018   TRIG 90 09/14/2018   CHOLHDL 3.1 09/14/2018   Lab Results  Component Value Date   HGBA1C 6.4 (H) 09/14/2018   No results found for: [id number] Lab Results  Component Value Date   TSH 2.000 03/14/2018      ASSESSMENT AND PLAN 58 y.o. year old female  has a [name] of Allergy, Arthritis, Atypical chest pain (06/30/2016), Back pain, Bruises easily,  Chronic lower back pain, Coronary artery disease, Depression, Headache(784.0), Rebekah of bronchitis, Hypertension,  Hypothyroidism (06/30/2016), Vitamin D deficiency, and Weakness. here with ***   I spent 15 minutes with the patient. 50% of this time was spent   Butler Denmark, [place], DNP 11/27/2018, 8:37 PM [facility] [address], [place], [place] [address] [phone]

## 2018-11-28 ENCOUNTER — Ambulatory Visit: Payer: BLUE CROSS/BLUE SHIELD | Admitting: Neurology

## 2018-12-01 ENCOUNTER — Other Ambulatory Visit: Payer: Self-pay | Admitting: Adult Health

## 2018-12-01 ENCOUNTER — Other Ambulatory Visit: Payer: Self-pay | Admitting: Internal Medicine

## 2018-12-01 NOTE — Telephone Encounter (Signed)
Cyclobenzaprine refill 

## 2018-12-05 ENCOUNTER — Encounter: Payer: Self-pay | Admitting: Cardiovascular Disease

## 2018-12-05 ENCOUNTER — Ambulatory Visit (INDEPENDENT_AMBULATORY_CARE_PROVIDER_SITE_OTHER): Payer: BLUE CROSS/BLUE SHIELD | Admitting: Cardiovascular Disease

## 2018-12-05 ENCOUNTER — Other Ambulatory Visit: Payer: Self-pay

## 2018-12-05 VITALS — BP 137/87 | HR 87 | Ht 61.0 in | Wt 201.8 lb

## 2018-12-05 DIAGNOSIS — E7800 Pure hypercholesterolemia, unspecified: Secondary | ICD-10-CM | POA: Diagnosis not present

## 2018-12-05 DIAGNOSIS — I10 Essential (primary) hypertension: Secondary | ICD-10-CM

## 2018-12-05 DIAGNOSIS — I2510 Atherosclerotic heart disease of native coronary artery without angina pectoris: Secondary | ICD-10-CM | POA: Diagnosis not present

## 2018-12-05 NOTE — Progress Notes (Signed)
Cardiology Office Note   Date:  12/05/2018   ID:  [name], DOB 1960/07/16, MRN [id number]  PCP:  Dorothyann PengSanders, Robyn, MD  Cardiologist:   Chilton Siiffany Pottsgrove, MD   No chief complaint on file.    History of Present Illness: [name] is a 58 y.o. female CAD s/p PCI, hypertension who presents for follow up.  She was initially seen 06/2016 for an abnormal EKG.  Her sister died of a heart attack at age 58 without any preceding symptoms.  She also has a family history of long QT syndrome. Her mother, aunt, and 2 cousins all have this disease. She also reported intermittent episodes of atypical chest pain and was referred for an ETT 07/22/16 that revealed ST depressions.  She achieved 10.1 METs on the Bruce protocol.  She was referred for left heart catheterization that showed normal coronary arteries.   [name] was also noted to have a systolic murmur.  She was referred for an echo that revealed LVEF 65-70% and grade 2 diastolic dysfunction.  Pulmonary pressures were mildly elevated (PASP 48 mmHg).  [name] presented to the hospital with chest pain 07/2018.  She underwent LHC which showed 85% mid left circumflex disease with 30 to 40% stenoses in OM 2.  She underwent PCI of the left circumflex.  Left ventriculography revealed LVEF 55 to 65%.  She followed up with Rebekah ReiningKathryn Lawrence, DNP on 07/2018 and had some wrist pain but was otherwise well. Her main complaint is pain in her L leg.  She is scheduled to have an injection in December.  The pain makes it hard for her to walk or stand.  She is unable to exercise.  She hasn't had any chest pain and her breathing has been stable.  She has rare heart fluttering but no dizziness or syncope.   Past Medical History:  Diagnosis Date  . Allergy   . Arthritis    back   . Atypical chest pain 06/30/2016  . Back pain    DUE TO INJURY AT WORK ON05/2013  . Bruises easily   . Chronic lower back pain   . Coronary artery disease   . Depression    was on meds 2 yrs ago   . Headache(784.0)    rarely  . History of bronchitis    last time about 7322yrs ago   . Hypertension    takes Benicar daily  . Hypothyroidism 06/30/2016  . Vitamin D deficiency    takes Vitamin D 2 times/wk  . Weakness    and numbness right foot    Past Surgical History:  Procedure Laterality Date  . BACK SURGERY    . CARPAL TUNNEL RELEASE Right   . CESAREAN SECTION  1988  . COLONOSCOPY    . EXPLORATORY LAPAROTOMY  1991   "took out fatty tumor" (11/09/2012)  . LEFT HEART CATH AND CORONARY ANGIOGRAPHY N/A 08/06/2016   Procedure: Left Heart Cath and Coronary Angiography;  Surgeon: Lyn RecordsSmith, Henry W, MD;  [facility];  Service: Cardiovascular;  Laterality: N/A;  . LEFT HEART CATH AND CORONARY ANGIOGRAPHY N/A 07/12/2018   Procedure: LEFT HEART CATH AND CORONARY ANGIOGRAPHY;  Surgeon: Marykay LexHarding, David W, MD;  [facility];  Service: Cardiovascular;  Laterality: N/A;  . LUMBAR LAMINECTOMY/DECOMPRESSION MICRODISCECTOMY  11/09/2012   "L3-4" (11/09/2012)  . LUMBAR LAMINECTOMY/DECOMPRESSION MICRODISCECTOMY N/A 11/09/2012   Procedure: L3-L4 DECOMPRESSION AND MICRODISCECTOMY   (1 LEVEL);  Surgeon: Venita Lickahari Brooks, MD;  Location:  [facility] OR;  Service: Orthopedics;  Laterality: N/A;     Current Outpatient Medications  Medication Sig Dispense Refill  . acetaminophen (TYLENOL) 500 MG tablet Take 1,000 mg by mouth every 6 (six) hours as needed for headache (pain).    Marland Kitchen aspirin EC 81 MG EC tablet Take 1 tablet (81 mg total) by mouth daily.    Marland Kitchen atorvastatin (LIPITOR) 80 MG tablet TAKE 1 TABLET (80 MG TOTAL) BY MOUTH DAILY AT 6 PM. 90 tablet 1  . carvedilol (COREG) 6.25 MG tablet TAKE 1 TABLET (6.25 MG TOTAL) BY MOUTH 2 (TWO) TIMES DAILY WITH A MEAL. 180 tablet 2  . clopidogrel (PLAVIX) 75 MG tablet TAKE 1 TABLET (75 MG TOTAL) BY MOUTH DAILY WITH BREAKFAST. 90 tablet 1  . cyclobenzaprine (FLEXERIL) 10 MG tablet TAKE 1 TABLET BY MOUTH EVERY DAY AS NEEDED FOR  MUSCLE SPASM 30 tablet 0  . fluticasone (FLONASE) 50 MCG/ACT nasal spray Place 1 spray into both nostrils daily as needed for allergies or rhinitis.     Marland Kitchen levocetirizine (XYZAL) 5 MG tablet TAKE 1 TABLET BY MOUTH EVERY DAY (Patient taking differently: Take 5 mg by mouth daily. ) 90 tablet 1  . nitroGLYCERIN (NITROSTAT) 0.4 MG SL tablet Place 1 tablet (0.4 mg total) under the tongue every 5 (five) minutes as needed for chest pain. 30 tablet 1  . olmesartan-hydrochlorothiazide (BENICAR HCT) 40-25 MG tablet TAKE 1 TABLET BY MOUTH EVERY DAY 90 tablet 0  . topiramate (TOPAMAX) 100 MG tablet Take 2 tablets (200 mg total) by mouth at bedtime. 60 tablet 11  . traMADol (ULTRAM) 50 MG tablet Take 1 tablet (50 mg total) by mouth every 6 (six) hours as needed. 30 tablet 0  . VITAMIN D PO Take 1 tablet by mouth daily.     No current facility-administered medications for this visit.     Allergies:   Patient has no known allergies.    Social History:  The patient  reports that she has never smoked. She has never used smokeless tobacco. She reports current alcohol use of about 7.0 standard drinks of alcohol per week. She reports that she does not use drugs.   Family History:  The patient's family history includes CAD in her brother and mother; Heart attack in her sister; Heart disease in her mother and sister; Liver disease in her brother; Other in her father; Stroke in her mother.    ROS:  Please see the history of present illness.   Otherwise, review of systems are positive for none.   All other systems are reviewed and negative.    PHYSICAL EXAM: VS:  BP 137/87   Pulse 87   Ht 5\' 1"  (1.549 m)   Wt 201 lb 12.8 oz (91.5 kg)   LMP 06/03/2011   SpO2 100%   BMI 38.13 kg/m  , BMI Body mass index is 38.13 kg/m. GENERAL:  Well appearing.  No acute distress. HEENT:  Pupils equal round and reactive, fundi not visualized, oral mucosa unremarkable NECK:  No jugular venous distention, waveform within normal  limits, carotid upstroke brisk and symmetric, +R carotid bruit LUNGS:  Clear to auscultation bilaterally.  No crackles, wheezes or rhonchi. HEART:  RRR.  PMI not displaced or sustained,S1 and S2 within normal limits, no S3, no S4, no clicks, no rubs, no murmur ABD:  Flat, positive bowel sounds normal in frequency in pitch, no bruits, no rebound, no guarding, no midline pulsatile mass, no hepatomegaly, no splenomegaly EXT:  2 plus  pulses throughout, no edema, no cyanosis no clubbing SKIN:  No rashes no nodules NEURO:  Cranial nerves II through XII grossly intact, motor grossly intact throughout PSYCH:  Cognitively intact, oriented to person place and time  EKG:  EKG is not ordered today. The ekg ordered today demonstrates sinus rhythm. Rate 78 bpm.    LHC 07/12/18:  CULPRIT LESION: Mid Cx lesion is 85% stenosed with 30-40% stenosed side branch in 2nd Mrg.  A drug-eluting stent was successfully placed (crossing OM2) using a STENT SYNERGY DES 3X20 - post-dilated to 3.3 mm  Post intervention, there is a 0% residual stenosis.  -----------------------------------------  Mid LAD lesion is 30% stenosed.  Prox RCA lesion is 25% stenosed.  -----------------------------------------  The left ventricular systolic function is normal. The left ventricular ejection fraction is 55-65% by visual estimate.  LV end diastolic pressure is normal.   SUMMARY  Severe Single Vessel CAD mLCX (@ OM2) - s/p Successful DES PCI (Synergy DES 3.0 x 20 -- 3.3 mm)  Otherwise mild disease in LAD & RCA  Normal LVEF & normal to low EDP  RECOMMENDATIONS  Overnight monitoring post-PCI with TR Band Removal   Continue aggressive Risk Factor Modification  Echo 07/13/16: Study Conclusions  - Left ventricle: The cavity size was normal. Wall thickness was   normal. Systolic function was vigorous. The estimated ejection   fraction was in the range of 65% to 70%. Wall motion was normal;   there were no regional  wall motion abnormalities. Features are   consistent with a pseudonormal left ventricular filling pattern,   with concomitant abnormal relaxation and increased filling   pressure (grade 2 diastolic dysfunction). - Mitral valve: There was mild regurgitation. - Pulmonary arteries: Systolic pressure was moderately increased.   PA peak pressure: 48 mm Hg (S).  ETT 07/22/16: Blood pressure demonstrated a normal response to exercise.  Clinically negative Electrically positive for ischemia with significant ST changes beginning in Stage II.  Excellent exercise tolerance  Echo 07/13/16: Study Conclusions  - Left ventricle: The cavity size was normal. Wall thickness was   normal. Systolic function was vigorous. The estimated ejection   fraction was in the range of 65% to 70%. Wall motion was normal;   there were no regional wall motion abnormalities. Features are   consistent with a pseudonormal left ventricular filling pattern,   with concomitant abnormal relaxation and increased filling   pressure (grade 2 diastolic dysfunction). - Mitral valve: There was mild regurgitation. - Pulmonary arteries: Systolic pressure was moderately increased.   PA peak pressure: 48 mm Hg (S).  Recent Labs: 03/14/2018: TSH 2.000 09/14/2018: ALT 23; BUN 17; Creatinine, Ser 0.89; Hemoglobin 11.7; Platelets 343; Potassium 4.6; Sodium 141   06/17/16: TSH 2.7 Hemoglobin A1c 5.9 Total cholesterol 219, triglycerides 83, HDL 67, LDL 139  Lipid Panel    Component Value Date/Time   CHOL 138 09/14/2018 1039   TRIG 90 09/14/2018 1039   HDL 44 09/14/2018 1039   CHOLHDL 3.1 09/14/2018 1039   CHOLHDL 4.3 07/11/2018 0612   VLDL 18 07/11/2018 0612   LDLCALC 77 09/14/2018 1039      Wt Readings from Last 3 Encounters:  12/05/18 201 lb 12.8 oz (91.5 kg)  09/14/18 202 lb 6.4 oz (91.8 kg)  08/23/18 206 lb 8 oz (93.7 kg)      ASSESSMENT AND PLAN:  # CAD: # Pure hypercholesterolemia: Ms. Beahm presented with  unstable angina 06/2018 and had a DES in the LCX.  She is  doing well.  Plan on DAPT for 1 year then aspirin only.  Continue atorvastatin and carvedilol.  She is OK to hold clopidogrel for injection 12/2018.  Ideally she would stay on aspirin.  If both need to be held, this should be done towards the end of December.  # Hypertension:  Blood pressure was elevated both initially and on repeat.  She has a lot of pain today and thinks this is contributing.  Her blood pressure has been better controlled when she seen other physicians.  We will provide her with a blood pressure cuff.  She is going to track her blood pressure at home and bring for follow-up with our pharmacist.  Continue carvedilol, olmesartan, and HCTZ.  Current medicines are reviewed at length with the patient today.  The patient does not have concerns regarding medicines.  The following changes have been made:  no change  Labs/ tests ordered today include:   No orders of the defined types were placed in this encounter.    Disposition:   FU with Hephzibah Strehle C. Duke Salvia, MD, [facility] in 4 months.    This note was written with the assistance of speech recognition software.  Please excuse any transcriptional errors.  Signed, Jonanthony Nahar C. Duke Salvia, MD, [facility]  12/05/2018 4:41 PM    [facility]

## 2018-12-05 NOTE — Patient Instructions (Signed)
Medication Instructions:  Your physician recommends that you continue on your current medications as directed. Please refer to the Current Medication list given to you today.  *If you need a refill on your cardiac medications before your next appointment, please call your pharmacy*  Lab Work: FASTING LP/CMET IN 4 MONTHS A FEW DAYS PRIOR TO YOUR FOLLOW UP If you have labs (blood work) drawn today and your tests are completely normal, you will receive your results only by: Marland Kitchen MyChart Message (if you have MyChart) OR . A paper copy in the mail If you have any lab test that is abnormal or we need to change your treatment, we will call you to review the results.  Testing/Procedures: NONE  Follow-Up: At [facility], you and your health needs are our priority.  As part of our continuing mission to provide you with exceptional heart care, we have created designated Provider Care Teams.  These Care Teams include your primary Cardiologist (physician) and Advanced Practice Providers (APPs -  Physician Assistants and Nurse Practitioners) who all work together to provide you with the care you need, when you need it.  Your next appointment:   4 month(s)  The format for your next appointment:   Either In Person or Virtual  Provider:   You may see Skeet Latch, MD or one of the following Advanced Practice Providers on your designated Care Team:    Kerin Ransom, PA-C  [place], [place]  Coletta Memos, [place]  Your physician recommends that you schedule a follow-up appointment in: 1 MONTH WITH PHARM D  Other Instructions   WORK ON EXERCISING WITH A GOAL OF [address]. ONCE YOU RECEIVE IT MONITOR AND LOG YOU BLOOD PRESSURE, BRING TO FOLLOW UP

## 2018-12-06 ENCOUNTER — Telehealth: Payer: Self-pay | Admitting: Licensed Clinical Social Worker

## 2018-12-06 NOTE — Telephone Encounter (Signed)
CSW referred to assist patient with obtaining a BP cuff. CSW contacted patient to inform cuff will be delivered to home. Patient grateful for support and assistance. CSW available as needed. Jackie Jaye Polidori, LCSW, CCSW-MCS [phone]  

## 2018-12-11 ENCOUNTER — Telehealth: Payer: Self-pay | Admitting: *Deleted

## 2018-12-11 NOTE — Telephone Encounter (Signed)
   [facility] Pre-operative Risk Assessment    Request for surgical clearance:  1. What type of surgery is being performed? TBD   2. When is this surgery scheduled?   LUMBAR NERVE ROOT BLOCK   3. What type of clearance is required (medical clearance vs. Pharmacy clearance to hold med vs. Both)? MEDICATION   4. Are there any medications that need to be held prior to surgery and how long?PLAVIX 5 DAYS PRIOR    5. Practice name and name of physician performing surgery?  [facility]    6. What is your office phone number?[phone]      7.   What is your office fax number?[phone]   8.   Anesthesia type (None, local, MAC, general) ?

## 2018-12-11 NOTE — Telephone Encounter (Addendum)
   Primary Cardiologist: Skeet Latch, MD  Chart reviewed as part of pre-operative protocol coverage. Patient has history of CAD s/p PCI/DES to left circumflex in 06/2018, hypertension, chronic low back pain, and hypothyroidism. She was recently seen by Dr. Oval Linsey on 12/05/2018 at which time she denied any chest pain and reported stable breathing. However, activity significantly limited by left leg pain. Felt to be at acceptable risk for upcoming lumbar spine injection at that time. Per Dr. Blenda Mounts note: "OK to hold clopidogrel for injection 12/2018.  Ideally she would stay on aspirin.  If both need to be held, this should be done towards the end of December." Both should be restarted as soon as able following injection.  I will route this recommendation to the requesting party via Epic fax function and remove from pre-op pool.  Please call with questions.  Darreld Mclean, PA-C 12/11/2018, 11:44 AM

## 2018-12-15 ENCOUNTER — Ambulatory Visit
Admission: RE | Admit: 2018-12-15 | Discharge: 2018-12-15 | Disposition: A | Discharge status: Home / Self Care | Payer: BLUE CROSS/BLUE SHIELD | Source: Ambulatory Visit | Attending: Physical Medicine and Rehabilitation | Admitting: Physical Medicine and Rehabilitation

## 2018-12-15 DIAGNOSIS — M5416 Radiculopathy, lumbar region: Secondary | ICD-10-CM

## 2018-12-15 IMAGING — XA DG EPIDURAL NERVE ROOT
2 series · 2 of 2 positions shown · IV contrast (isovue)
Comparison: MRI [DATE]

CLINICAL DATA: Left groin and leg discomfort. Left L4 nerve root
block requested.

EXAM:
Left L4 nerve root block and trans foraminal epidural
TECHNIQUE: The overlying skin was prepped with Betadine and draped in sterile
fashion. Skin anesthesia was carried [DATE]% Lidocaine. A
curved 22 gauge spinal needle was directed into the superior ventral
neural foramen on the left at L4-5.Injection of a few drops of
Isovue 200 demonstrates spread outlining the left L4 nerve root. No
intravascular extension. 120mg Depo-Medrol and 2cc 1% Lidocaine were
subsequently administered. No apparent complication. The patient
tolerated the procedure without difficulty and was transferred to
recovery in excellent condition. Noted that during the injection the
patient reported concordant symptoms.
FLUOROSCOPY TIME:  0 minutes 54 seconds. 49.28 micro gray meter
squared

[Series 1: ortho adipose · 1 of 1 slices shown (1 of 2)]
[im 1/1]
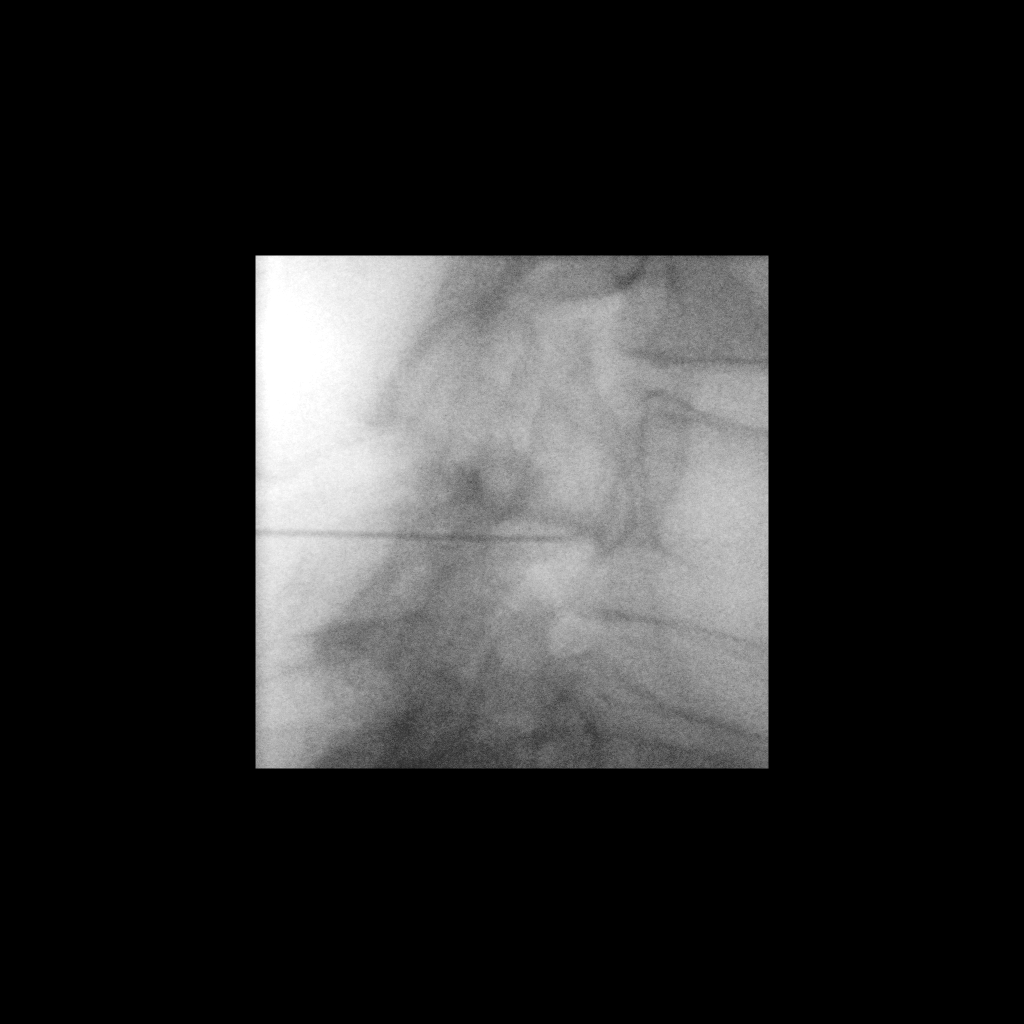

[Series 2: ortho adipose · 1 of 1 slices shown (2 of 2)]
[im 1/1]
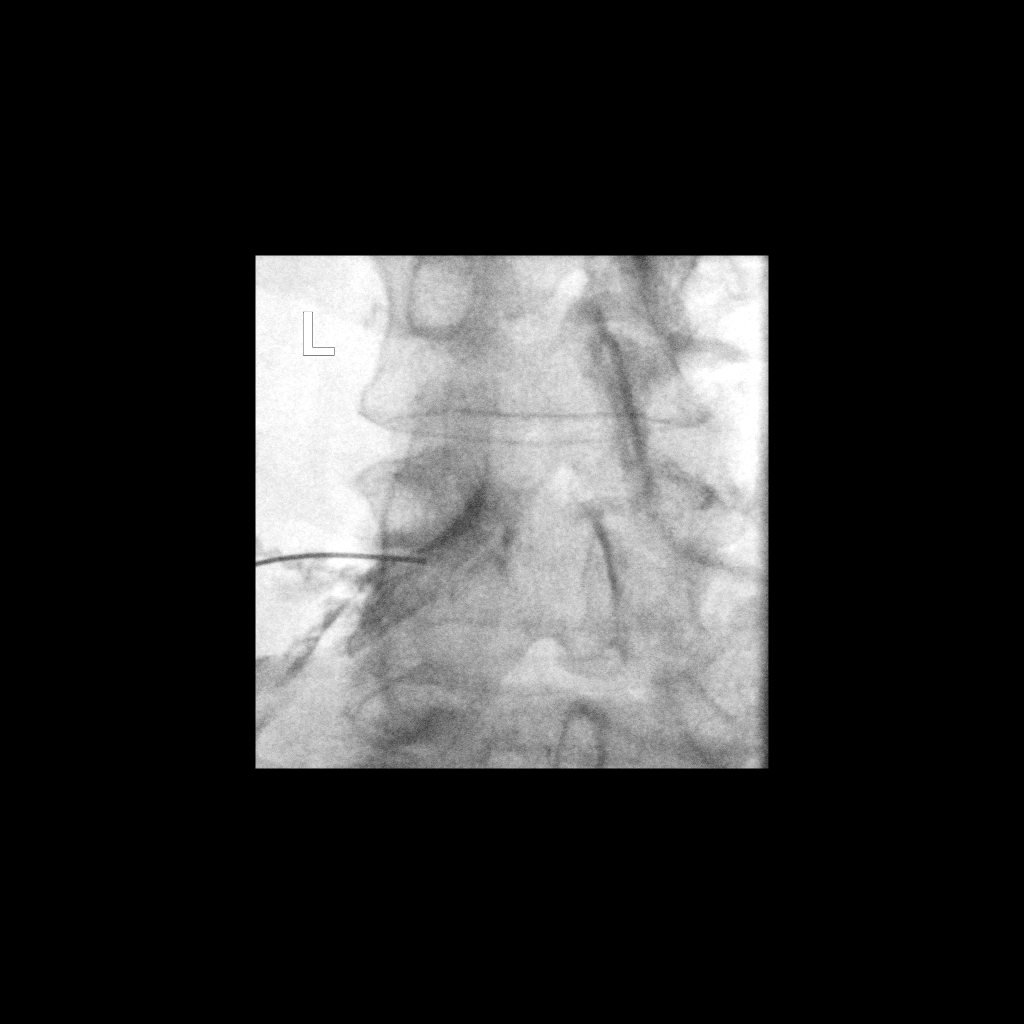

[2 of 2 positions shown; findings below may reference images not displayed]

IMPRESSION: Technically successful left L4 selective nerve root block and
transforaminal epidural steroid injection. At the time of discharge
patient was feeling some relief.

## 2018-12-15 MED ORDER — METHYLPREDNISOLONE ACETATE 40 MG/ML INJ SUSP (RADIOLOG
120.0000 mg | Freq: Once | INTRAMUSCULAR | Status: AC
  Administered 2018-12-15: 10:00:00 120 mg via EPIDURAL

## 2018-12-15 MED ORDER — IOPAMIDOL (ISOVUE-M 200) INJECTION 41%
1.0000 mL | Freq: Once | INTRAMUSCULAR | Status: AC
  Administered 2018-12-15: 10:00:00 1 mL via EPIDURAL

## 2018-12-15 NOTE — Discharge Instructions (Signed)

## 2018-12-20 NOTE — Progress Notes (Signed)
PATIENT: [name] DOB: 1960-02-13  REASON FOR VISIT: follow up HISTORY FROM: patient  HISTORY OF PRESENT ILLNESS: Today 12/21/18  HISTORY [name] is a 58 year old female, seen in request by her primary care physician Dr. Baird Cancer, Bailey Mech for episode of visual loss, headaches, initial evaluation was on August 23, 2018.  I have reviewed and summarized the referring note from the referring physician.  She had past medical history of hyperlipidemia, hypertension, coronary artery disease, status post stent on July 12, 2018, currently taking aspirin and Plavix  She denies a previous history of migraine headaches, on August 18, 2018, while sitting at the computer, she noticed difficulty reading, then bilateral vision went out, she was able to lie down with eyes closed resting for 30 minutes, her vision came back normal, at the same time she noticed mild left temporal region throbbing headaches, when she went back to look at computer again, she experienced visual loss bilaterally again, was sent to the emergency room  I personally reviewed MRI of the brain no acute abnormality  MRA of neck: 50% stenosis of left internal carotid artery, critical stenosis of proximal right internal carotid artery, estimated to be greater than 80% stenosis, 50% stenosis of the left internal carotid artery  MRA of the brain showed no significant large vessel stenosis intracranially  She experienced a severe headache on August 08/2018, severe holoacranial pounding headache with associated light noise sensitivity, nauseous that was helped by Tylenol  Laboratory evaluations in 2020, LDL 148, cholesterol 217, negative C-reactive protein, ESR was mildly elevated 50, UDS was negative, normal TSH, A1c 5.9  Update December 21, 2018 SS: She was sent for carotid artery ultrasound, but has yet to be completed.  She remains on aspirin and Plavix.  She started on Topamax for migraine, Cambia as needed.   Since last seen, carotid artery ultrasound was not completed. She remains on aspirin and Plavix.  She is now taking Topamax 200 mg at bedtime.  She never filled Cambia to take as needed.  She reports her headaches have improved, more than 50%.  She describes the sensation as vision change, her vision will get fuzzy, sometimes headache retro-orbitally will follow, sometimes not, no other symptoms.  She will have to lie down to get it to subside.  Since last seen, she reports this happens twice a month.  She does work at a computer, thinks sometimes this exacerbates her episodes.  She has seen cardiology since last seen.  She does feel drowsiness the next morning after taking Topamax.  REVIEW OF SYSTEMS: Out of a complete 14 system review of symptoms, the patient complains only of the following symptoms, and all other reviewed systems are negative.  Headache  ALLERGIES: No Known Allergies  HOME MEDICATIONS: Outpatient Medications Prior to Visit  Medication Sig Dispense Refill  . acetaminophen (TYLENOL) 500 MG tablet Take 1,000 mg by mouth every 6 (six) hours as needed for headache (pain).    Marland Kitchen aspirin EC 81 MG EC tablet Take 1 tablet (81 mg total) by mouth daily.    Marland Kitchen atorvastatin (LIPITOR) 80 MG tablet TAKE 1 TABLET (80 MG TOTAL) BY MOUTH DAILY AT 6 PM. 90 tablet 1  . carvedilol (COREG) 6.25 MG tablet TAKE 1 TABLET (6.25 MG TOTAL) BY MOUTH 2 (TWO) TIMES DAILY WITH A MEAL. 180 tablet 2  . clopidogrel (PLAVIX) 75 MG tablet TAKE 1 TABLET (75 MG TOTAL) BY MOUTH DAILY WITH BREAKFAST. 90 tablet 1  . cyclobenzaprine (FLEXERIL) 10  MG tablet TAKE 1 TABLET BY MOUTH EVERY DAY AS NEEDED FOR MUSCLE SPASM 30 tablet 0  . fluticasone (FLONASE) 50 MCG/ACT nasal spray Place 1 spray into both nostrils daily as needed for allergies or rhinitis.     Marland Kitchen levocetirizine (XYZAL) 5 MG tablet TAKE 1 TABLET BY MOUTH EVERY DAY (Patient taking differently: Take 5 mg by mouth daily. ) 90 tablet 1  . nitroGLYCERIN (NITROSTAT)  0.4 MG SL tablet Place 1 tablet (0.4 mg total) under the tongue every 5 (five) minutes as needed for chest pain. 30 tablet 1  . olmesartan-hydrochlorothiazide (BENICAR HCT) 40-25 MG tablet TAKE 1 TABLET BY MOUTH EVERY DAY 90 tablet 0  . topiramate (TOPAMAX) 100 MG tablet Take 2 tablets (200 mg total) by mouth at bedtime. 60 tablet 11  . traMADol (ULTRAM) 50 MG tablet Take 1 tablet (50 mg total) by mouth every 6 (six) hours as needed. 30 tablet 0  . VITAMIN D PO Take 1 tablet by mouth daily.     No facility-administered medications prior to visit.    PAST MEDICAL HISTORY: Past Medical History:  Diagnosis Date  . Allergy   . Arthritis    back   . Atypical chest pain 06/30/2016  . Back pain    DUE TO INJURY AT WORK ON05/2013  . Bruises easily   . Chronic lower back pain   . Coronary artery disease   . Depression    was on meds 2 yrs ago   . Headache(784.0)    rarely  . History of bronchitis    last time about 52yr ago   . Hypertension    takes Benicar daily  . Hypothyroidism 06/30/2016  . Vitamin D deficiency    takes Vitamin D 2 times/wk  . Weakness    and numbness right foot    PAST SURGICAL HISTORY: Past Surgical History:  Procedure Laterality Date  . BACK SURGERY    . CARPAL TUNNEL RELEASE Right   . CESAREAN SECTION  1988  . COLONOSCOPY    . EXPLORATORY LAPAROTOMY  1991   "took out fatty tumor" (11/09/2012)  . LEFT HEART CATH AND CORONARY ANGIOGRAPHY N/A 08/06/2016   Procedure: Left Heart Cath and Coronary Angiography;  Surgeon: SBelva Crome MD;  [facility];  Service: Cardiovascular;  Laterality: N/A;  . LEFT HEART CATH AND CORONARY ANGIOGRAPHY N/A 07/12/2018   Procedure: LEFT HEART CATH AND CORONARY ANGIOGRAPHY;  Surgeon: HLeonie Man MD;  [facility];  Service: Cardiovascular;  Laterality: N/A;  . LUMBAR LAMINECTOMY/DECOMPRESSION MICRODISCECTOMY  11/09/2012   "L3-4" (11/09/2012)  . LUMBAR LAMINECTOMY/DECOMPRESSION  MICRODISCECTOMY N/A 11/09/2012   Procedure: L3-L4 DECOMPRESSION AND MICRODISCECTOMY   (1 LEVEL);  Surgeon: DMelina Schools MD;  [facility]  Service: Orthopedics;  Laterality: N/A;    FAMILY HISTORY: Family History  Problem Relation Age of Onset  . Heart disease Mother   . CAD Mother   . Stroke Mother   . Heart disease Sister   . Heart attack Sister   . Liver disease Brother   . Other Father        unknown medical history  . CAD Brother   . Colon cancer Neg Hx   . Esophageal cancer Neg Hx   . Rectal cancer Neg Hx   . Stomach cancer Neg Hx     SOCIAL HISTORY: Social History   Socioeconomic History  . Marital status: Divorced    Spouse name: Not on file  .  Number of children: 1  . Years of education: Not on file  . Highest education level: Some college, no degree  Occupational History  . Occupation: Sports administrator  Tobacco Use  . Smoking status: Never Smoker  . Smokeless tobacco: Never Used  Substance and Sexual Activity  . Alcohol use: Yes    Alcohol/week: 7.0 standard drinks    Types: 7 Glasses of wine per week    Comment: social  . Drug use: No  . Sexual activity: Yes    Birth control/protection: Post-menopausal  Other Topics Concern  . Not on file  Social History Narrative   Right-handed.   Occasional caffeine.   Lives alone.   Social Determinants of Health   Financial Resource Strain:   . Difficulty of Paying Living Expenses: Not on file  Food Insecurity:   . Worried About Charity fundraiser in the Last Year: Not on file  . Ran Out of Food in the Last Year: Not on file  Transportation Needs:   . Lack of Transportation (Medical): Not on file  . Lack of Transportation (Non-Medical): Not on file  Physical Activity:   . Days of Exercise per Week: Not on file  . Minutes of Exercise per Session: Not on file  Stress:   . Feeling of Stress : Not on file  Social Connections:   . Frequency of Communication with Friends and Family: Not on  file  . Frequency of Social Gatherings with Friends and Family: Not on file  . Attends Religious Services: Not on file  . Active Member of Clubs or Organizations: Not on file  . Attends Archivist Meetings: Not on file  . Marital Status: Not on file  Intimate Partner Violence:   . Fear of Current or Ex-Partner: Not on file  . Emotionally Abused: Not on file  . Physically Abused: Not on file  . Sexually Abused: Not on file    PHYSICAL EXAM  Vitals:   12/21/18 1420  BP: (!) 152/81  Pulse: 91  Temp: 97.6 F (36.4 C)  Weight: 198 lb 12.8 oz (90.2 kg)  Height: 5' 1" (1.549 m)   Body mass index is 37.56 kg/m.  Generalized: Well developed, in no acute distress   Neurological examination  Mentation: Alert oriented to time, place, history taking. Follows all commands speech and language fluent Cranial nerve II-XII: Pupils were equal round reactive to light. Extraocular movements were full, visual field were full on confrontational test. Facial sensation and strength were normal.  Head turning and shoulder shrug were normal and symmetric. Motor: The motor testing reveals 5 over 5 strength of all 4 extremities. Good symmetric motor tone is noted throughout.  Sensory: Sensory testing is intact to soft touch on all 4 extremities. No evidence of extinction is noted.  Coordination: Cerebellar testing reveals good finger-nose-finger and heel-to-shin bilaterally.  Gait and station: Gait is normal. Tandem gait is normal.  Reflexes: Deep tendon reflexes are symmetric and normal bilaterally.   DIAGNOSTIC DATA (LABS, IMAGING, TESTING) - I reviewed patient records, labs, notes, testing and imaging myself where available.  Lab Results  Component Value Date   WBC 8.0 09/14/2018   HGB 11.7 09/14/2018   HCT 36.5 09/14/2018   MCV 89 09/14/2018   PLT 343 09/14/2018      Component Value Date/Time   NA 141 09/14/2018 1039   K 4.6 09/14/2018 1039   CL 101 09/14/2018 1039   CO2 24  09/14/2018 1039  GLUCOSE 111 (H) 09/14/2018 1039   GLUCOSE 94 08/18/2018 1339   BUN 17 09/14/2018 1039   CREATININE 0.89 09/14/2018 1039   CALCIUM 10.1 09/14/2018 1039   PROT 7.6 09/14/2018 1039   ALBUMIN 4.8 09/14/2018 1039   AST 23 09/14/2018 1039   ALT 23 09/14/2018 1039   ALKPHOS 95 09/14/2018 1039   BILITOT 0.8 09/14/2018 1039   GFRNONAA 72 09/14/2018 1039   GFRAA 83 09/14/2018 1039   Lab Results  Component Value Date   CHOL 138 09/14/2018   HDL 44 09/14/2018   LDLCALC 77 09/14/2018   TRIG 90 09/14/2018   CHOLHDL 3.1 09/14/2018   Lab Results  Component Value Date   HGBA1C 6.4 (H) 09/14/2018   No results found for: VITAMINB12 Lab Results  Component Value Date   TSH 2.000 03/14/2018      ASSESSMENT AND PLAN 58 y.o. year old female  has a past medical history of Allergy, Arthritis, Atypical chest pain (06/30/2016), Back pain, Bruises easily, Chronic lower back pain, Coronary artery disease, Depression, Headache(784.0), History of bronchitis, Hypertension, Hypothyroidism (06/30/2016), Vitamin D deficiency, and Weakness. here with :  1.  Bilateral carotid artery disease -Right-sided high-grade stenosis on MRA, ultrasound of carotid artery was ordered, but has yet to be completed -I will work with Hinton Dyer, to get this ordered and scheduled in patient's network at [facility] by the end of the year -She remains on aspirin, Plavix -BP less than 130/80, LDL less than 100  2.  Headache with migraine features -Improved, 1-2 episodes a month, side effect of drowsiness with Topamax -Decrease Topamax 150 mg at bedtime -Cambia as needed -Return in 6 months or sooner if needed  I spent 15 minutes with the patient. 50% of this time was spent discussing her plan of care.  Butler Denmark, AGNP-C, DNP 12/21/2018, 2:52 PM [facility] [address], [place], [place] [address] [phone]

## 2018-12-21 ENCOUNTER — Other Ambulatory Visit: Payer: Self-pay

## 2018-12-21 ENCOUNTER — Encounter: Payer: Self-pay | Admitting: *Deleted

## 2018-12-21 ENCOUNTER — Encounter: Payer: Self-pay | Admitting: Neurology

## 2018-12-21 ENCOUNTER — Ambulatory Visit (INDEPENDENT_AMBULATORY_CARE_PROVIDER_SITE_OTHER): Payer: BLUE CROSS/BLUE SHIELD | Admitting: Neurology

## 2018-12-21 ENCOUNTER — Telehealth: Payer: Self-pay | Admitting: Neurology

## 2018-12-21 VITALS — BP 152/81 | HR 91 | Temp 97.6°F | Ht 61.0 in | Wt 198.8 lb

## 2018-12-21 DIAGNOSIS — G43109 Migraine with aura, not intractable, without status migrainosus: Secondary | ICD-10-CM | POA: Diagnosis not present

## 2018-12-21 MED ORDER — CAMBIA 50 MG PO PACK: 50.0000 mg | PACK | ORAL | 11 refills | Status: DC | PRN

## 2018-12-21 NOTE — Patient Instructions (Addendum)
You may decrease your Topamax to 150 mg at bedtime   I will send in Cambia powder you can take as needed for acute headache   I will get you setup for Carotid Ultrasound   Return in 6 months with Dr. Krista Blue or sooner if needed

## 2018-12-21 NOTE — Telephone Encounter (Signed)
Could you help get this patient scheduled for Carotid US, ordered 08/23/2018 by Dr. Krista Blue. She needs it have it done in the [place].

## 2018-12-25 NOTE — Telephone Encounter (Signed)
Sent to [facility] Via fax Telephone [phone] fax [phone]

## 2018-12-27 NOTE — Progress Notes (Signed)
I have reviewed and agreed above plan. 

## 2019-01-20 ENCOUNTER — Other Ambulatory Visit: Payer: Self-pay | Admitting: Internal Medicine

## 2019-01-28 ENCOUNTER — Other Ambulatory Visit: Payer: Self-pay | Admitting: Adult Health

## 2019-02-22 ENCOUNTER — Other Ambulatory Visit: Payer: Self-pay | Admitting: Internal Medicine

## 2019-02-22 NOTE — Telephone Encounter (Signed)
Cyclobenzaprine refill 

## 2019-03-07 ENCOUNTER — Other Ambulatory Visit: Payer: Self-pay | Admitting: Nurse Practitioner

## 2019-03-10 ENCOUNTER — Encounter (HOSPITAL_COMMUNITY): Payer: Self-pay

## 2019-03-10 ENCOUNTER — Ambulatory Visit (HOSPITAL_COMMUNITY)
Admission: EM | Admit: 2019-03-10 | Discharge: 2019-03-10 | Disposition: A | Discharge status: Home / Self Care | Payer: 59 | Attending: Emergency Medicine | Admitting: Emergency Medicine

## 2019-03-10 ENCOUNTER — Other Ambulatory Visit: Payer: Self-pay

## 2019-03-10 ENCOUNTER — Ambulatory Visit (INDEPENDENT_AMBULATORY_CARE_PROVIDER_SITE_OTHER): Payer: 59

## 2019-03-10 DIAGNOSIS — R3129 Other microscopic hematuria: Secondary | ICD-10-CM | POA: Diagnosis not present

## 2019-03-10 DIAGNOSIS — M546 Pain in thoracic spine: Secondary | ICD-10-CM

## 2019-03-10 DIAGNOSIS — R350 Frequency of micturition: Secondary | ICD-10-CM

## 2019-03-10 DIAGNOSIS — R079 Chest pain, unspecified: Secondary | ICD-10-CM | POA: Diagnosis not present

## 2019-03-10 LAB — POCT URINALYSIS DIP (DEVICE)
Bilirubin Urine: NEGATIVE
Glucose, UA: NEGATIVE mg/dL
Ketones, ur: NEGATIVE mg/dL
Leukocytes,Ua: NEGATIVE
Nitrite: NEGATIVE
Protein, ur: NEGATIVE mg/dL
Specific Gravity, Urine: 1.025 (ref 1.005–1.030)
Urobilinogen, UA: 0.2 mg/dL (ref 0.0–1.0)
pH: 6 (ref 5.0–8.0)

## 2019-03-10 IMAGING — DX DG CHEST 2V
2 series · 2 of 2 positions shown · non-contrast
Comparison: [DATE]

CLINICAL DATA: Left-sided thoracic pain and tenderness, rule out
pneumonia, pneumothorax, effusion

EXAM:
CHEST - 2 VIEW

[chest pa]
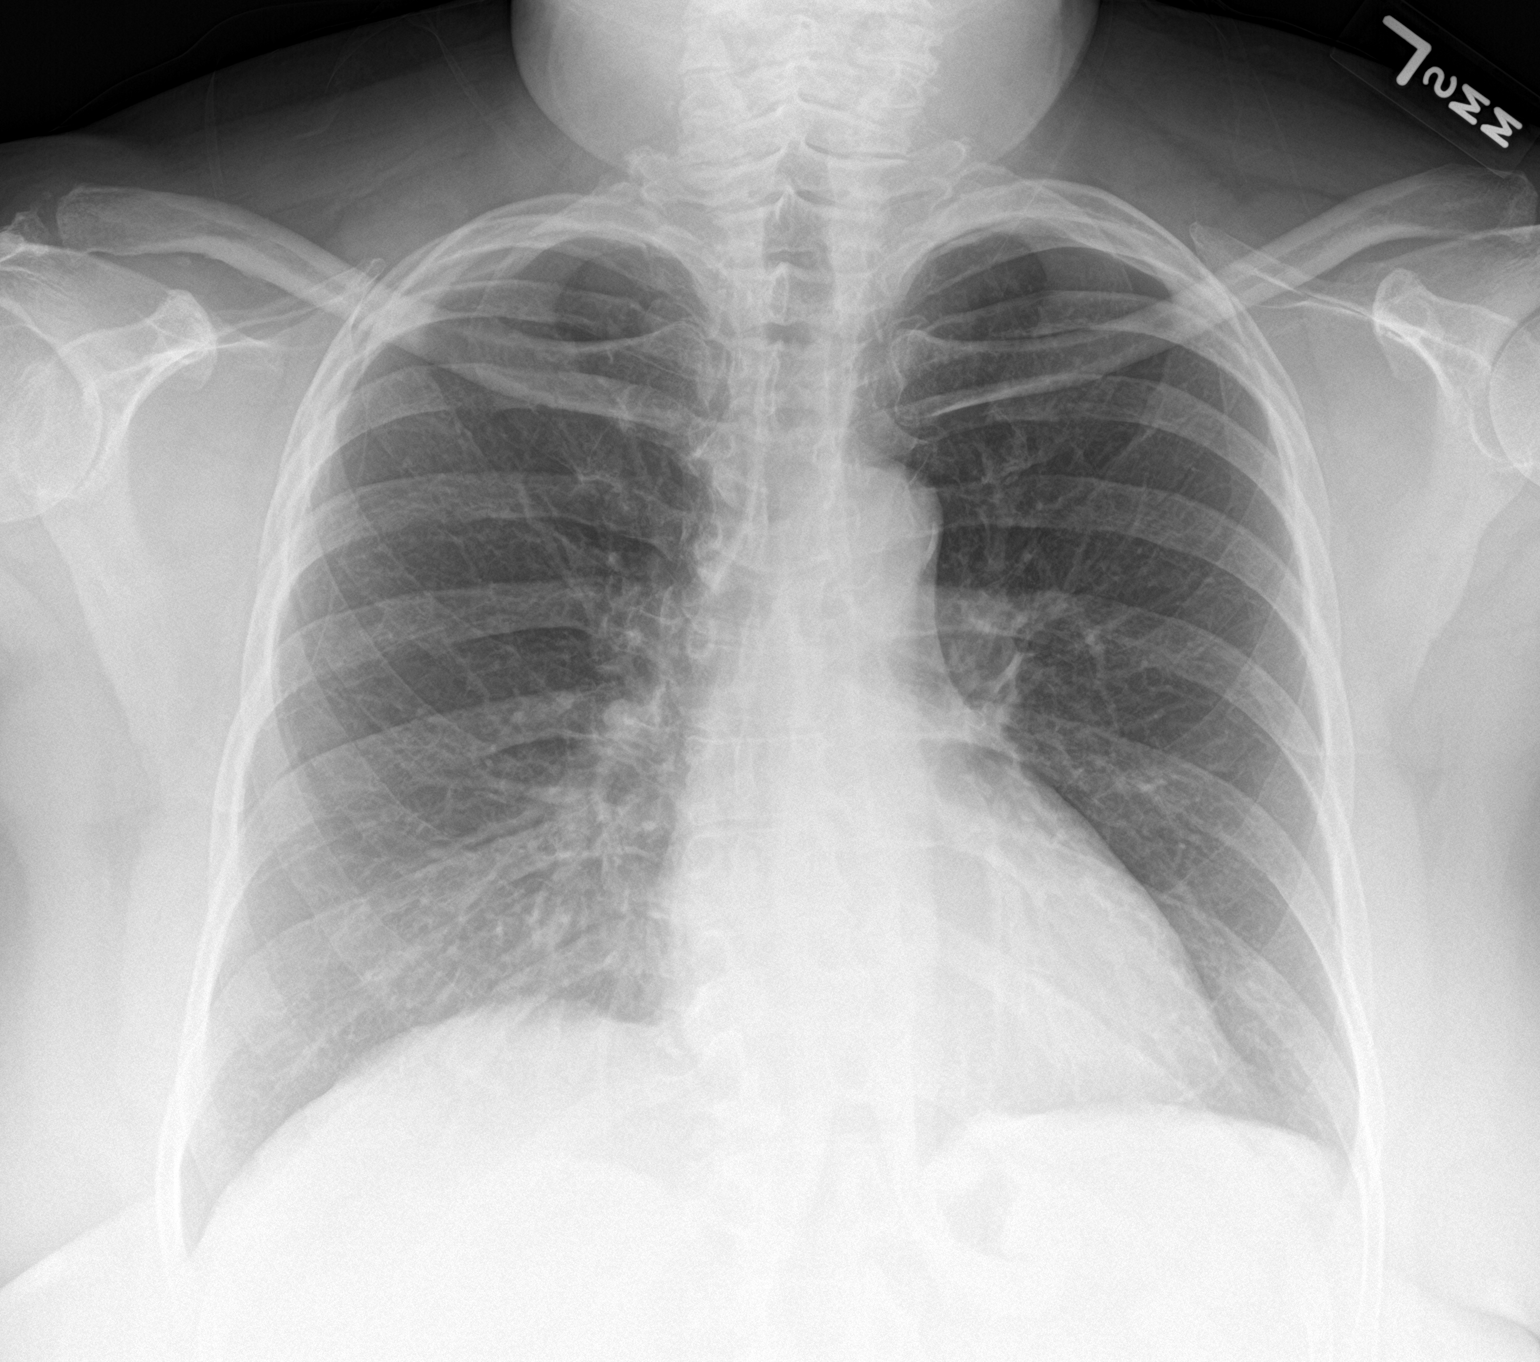

[chest lat]
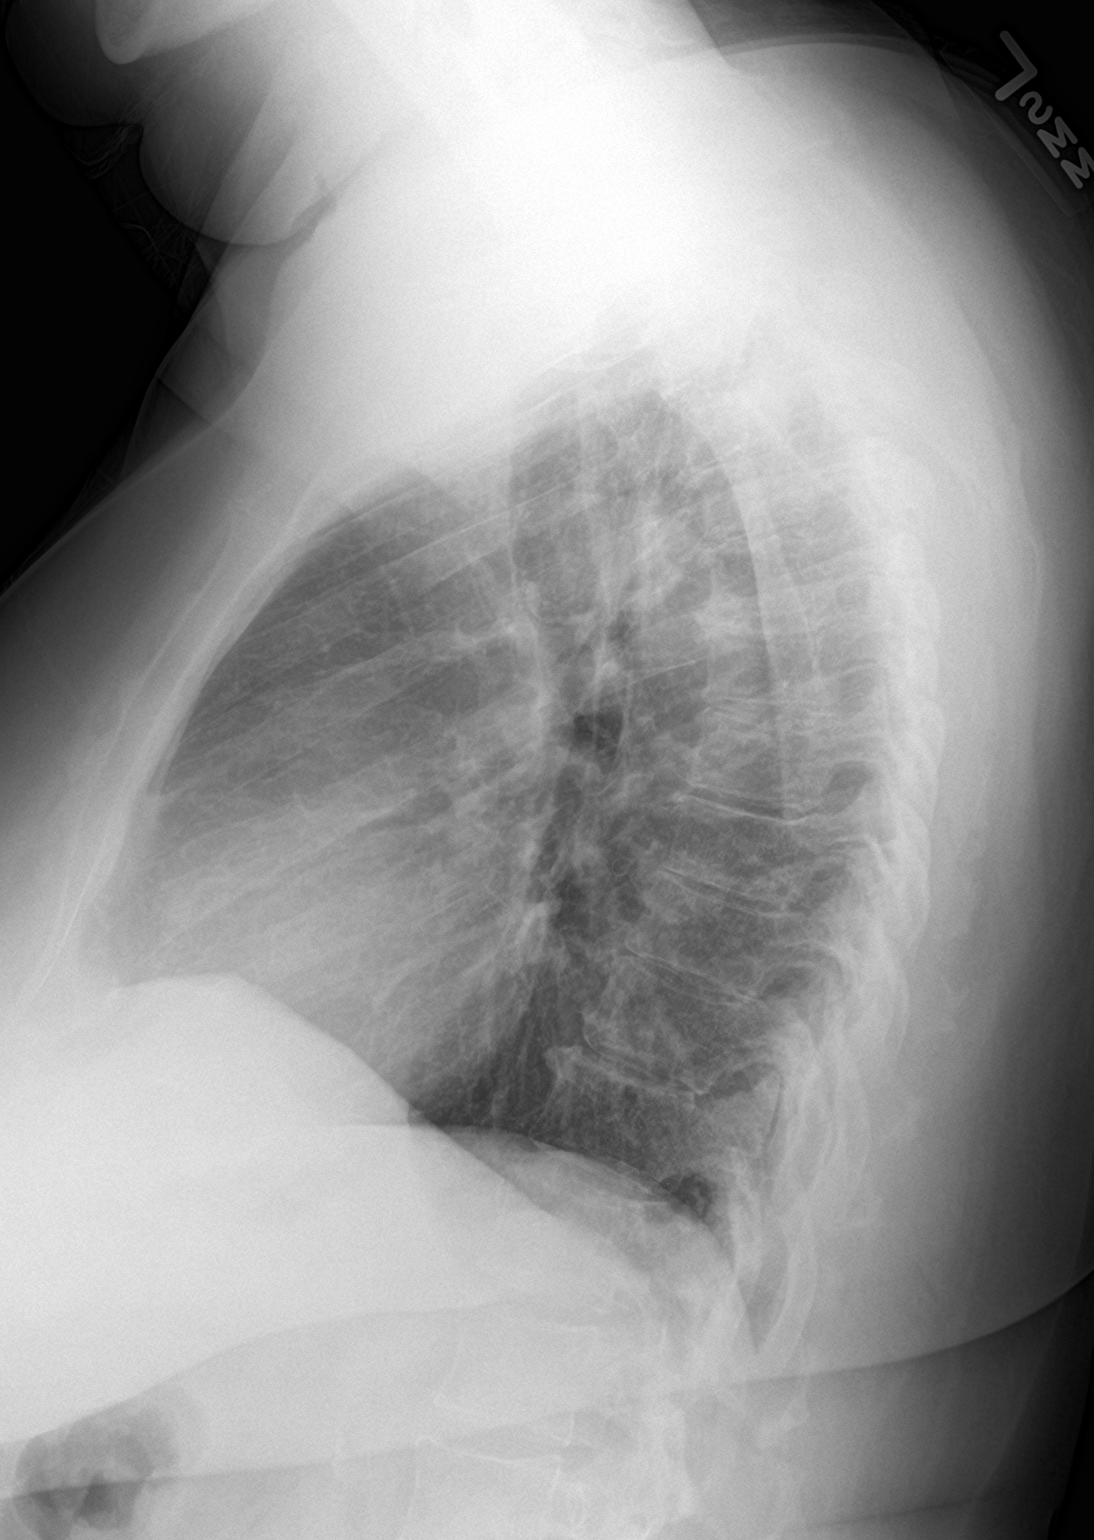

[2 of 2 positions shown; findings below may reference images not displayed]

FINDINGS: The heart size and mediastinal contours are within normal limits.
Both lungs are clear. Disc degenerative disease of the thoracic
spine.
IMPRESSION: No acute abnormality of the lungs. No radiographic findings to
explain left-sided pain and tenderness.

## 2019-03-10 MED ORDER — TIZANIDINE HCL 4 MG PO TABS: 4.0000 mg | ORAL_TABLET | Freq: Three times a day (TID) | ORAL | 0 refills | Status: DC | PRN

## 2019-03-10 NOTE — ED Triage Notes (Signed)
Pt reports having left-sided back pain x 1 week,. Last night pain radiated to left upper quadrant. Pt reports having increased urinary frequency 2 weeks ago.

## 2019-03-10 NOTE — Discharge Instructions (Addendum)
Your chest x-ray and EKG were normal.  Try 1000 mg of Tylenol 3-4 times a day as needed for pain, Zanaflex.  Discontinue the Flexeril.  You can continue trying topical ointment.  We will call in a prescription of Valtrex or acyclovir for rashes up, or he can see your doctor.  We may want you to come in to confirm that this is shingles of a rash does appear.  Follow-up with your doctor in 1 week to repeat your urinalysis.

## 2019-03-10 NOTE — ED Provider Notes (Signed)
HPI  SUBJECTIVE:  [name] is a 59 y.o. female who presents with 2 issues: First she reports 5 days of left posterior thoracic back pain described as a "pulled muscle" that is now radiating to the mid axillary line in a dermatomal distribution.  It is sore, intermittent, became constant today.  She reports hypersensitivity in this area, an occasional nonproductive cough.  No trauma to the area, rash, tingling or burning pain, change in physical activity, fevers, wheezing, other chest pain or shortness of breath.  No abdominal pain.  No hemoptysis.  No calf pain swelling surgery in the past 4 weeks recent immobilization.  She tried 650 mg of Tylenol 2 tabs at night, Flexeril and a topical ointment.  She states that the Flexeril helps.  She also states that lying on her back helps.  Symptoms aggravated with lying on her left side.  Is not associated with torso rotation lateral bending, deep inspiration.  Patient has a past medical history of coronary artery disease status post stent on Plavix, hypercholesterolemia, hypertension on ACE inhibitor.  No history of cancer, PE, DVT, kidney or liver disease, asthma, eczema, COPD, smoking, pneumothorax, shingles, zoster/ varicella, diabetes.  States this does not feel similar to when she was having "trouble with my heart"  Patient also reports over 2 weeks of urinary frequency.  No dysuria urgency cloudy odorous urine hematuria vaginal bleeding odor discharge.  States that she does not drink caffeine.  No nausea vomiting fevers abdominal or pelvic pain. PMD: Dorothyann Peng, MD   Past Medical History:  Diagnosis Date  . Allergy   . Arthritis    back   . Atypical chest pain 06/30/2016  . Back pain    DUE TO INJURY AT WORK ON05/2013  . Bruises easily   . Chronic lower back pain   . Coronary artery disease   . Depression    was on meds 2 yrs ago   . Headache(784.0)    rarely  . History of bronchitis    last time about 65yrs ago   . Hypertension     takes Benicar daily  . Hypothyroidism 06/30/2016  . Vitamin D deficiency    takes Vitamin D 2 times/wk  . Weakness    and numbness right foot    Past Surgical History:  Procedure Laterality Date  . BACK SURGERY    . CARPAL TUNNEL RELEASE Right   . CESAREAN SECTION  1988  . COLONOSCOPY    . EXPLORATORY LAPAROTOMY  1991   "took out fatty tumor" (11/09/2012)  . LEFT HEART CATH AND CORONARY ANGIOGRAPHY N/A 08/06/2016   Procedure: Left Heart Cath and Coronary Angiography;  Surgeon: Lyn Records, MD;  Location: [facility]AS[facility] CV [facility];  Service: Cardiovascular;  Laterality: N/A;  . LEFT HEART CATH AND CORONARY ANGIOGRAPHY N/A 07/12/2018   Procedure: LEFT HEART CATH AND CORONARY ANGIOGRAPHY;  Surgeon: Marykay Lex, MD;  Location: [facility] INVASIVE CV LAB;  Service: Cardiovascular;  Laterality: N/A;  . LUMBAR LAMINECTOMY/DECOMPRESSION MICRODISCECTOMY  11/09/2012   "L3-4" (11/09/2012)  . LUMBAR LAMINECTOMY/DECOMPRESSION MICRODISCECTOMY N/A 11/09/2012   Procedure: L3-L4 DECOMPRESSION AND MICRODISCECTOMY   (1 LEVEL);  Surgeon: Venita Lick, MD;  Location: [facility] OR;  Service: Orthopedics;  Laterality: N/A;    Family History  Problem Relation Age of Onset  . Heart disease Mother   . CAD Mother   . Stroke Mother   . Heart disease Sister   . Heart attack Sister   . Liver disease Brother   .  Other Father        unknown medical history  . CAD Brother   . Colon cancer Neg Hx   . Esophageal cancer Neg Hx   . Rectal cancer Neg Hx   . Stomach cancer Neg Hx     Social History   Tobacco Use  . Smoking status: Never Smoker  . Smokeless tobacco: Never Used  Substance Use Topics  . Alcohol use: Yes    Alcohol/week: 7.0 standard drinks    Types: 7 Glasses of wine per week    Comment: social  . Drug use: No    No current facility-administered medications for this encounter.  Current Outpatient Medications:  .  acetaminophen (TYLENOL) 500 MG tablet, Take 1,000 mg by mouth every 6 (six) hours as  needed for headache (pain)., Disp: , Rfl:  .  aspirin EC 81 MG EC tablet, Take 1 tablet (81 mg total) by mouth daily., Disp:  , Rfl:  .  atorvastatin (LIPITOR) 80 MG tablet, TAKE 1 TABLET (80 MG TOTAL) BY MOUTH DAILY AT 6 PM., Disp: 90 tablet, Rfl: 3 .  carvedilol (COREG) 6.25 MG tablet, TAKE 1 TABLET (6.25 MG TOTAL) BY MOUTH 2 (TWO) TIMES DAILY WITH A MEAL., Disp: 180 tablet, Rfl: 2 .  clopidogrel (PLAVIX) 75 MG tablet, TAKE 1 TABLET (75 MG TOTAL) BY MOUTH DAILY WITH BREAKFAST., Disp: 90 tablet, Rfl: 3 .  Diclofenac Potassium,Migraine, (CAMBIA) 50 MG PACK, Take 50 mg by mouth as needed., Disp: 12 each, Rfl: 11 .  fluticasone (FLONASE) 50 MCG/ACT nasal spray, Place 1 spray into both nostrils daily as needed for allergies or rhinitis. , Disp: , Rfl:  .  levocetirizine (XYZAL) 5 MG tablet, TAKE 1 TABLET BY MOUTH EVERY DAY (Patient taking differently: Take 5 mg by mouth daily. ), Disp: 90 tablet, Rfl: 1 .  Magnesium 250 MG TABS, TAKE 1 TABLET BY MOUTH DAILY WITH EVENING MEALS, Disp: 30 tablet, Rfl: 2 .  nitroGLYCERIN (NITROSTAT) 0.4 MG SL tablet, Place 1 tablet (0.4 mg total) under the tongue every 5 (five) minutes as needed for chest pain., Disp: 30 tablet, Rfl: 1 .  olmesartan-hydrochlorothiazide (BENICAR HCT) 40-25 MG tablet, TAKE 1 TABLET BY MOUTH EVERY DAY, Disp: 90 tablet, Rfl: 0 .  tiZANidine (ZANAFLEX) 4 MG tablet, Take 1 tablet (4 mg total) by mouth every 8 (eight) hours as needed for muscle spasms., Disp: 30 tablet, Rfl: 0 .  topiramate (TOPAMAX) 100 MG tablet, Take 2 tablets (200 mg total) by mouth at bedtime., Disp: 60 tablet, Rfl: 11 .  traMADol (ULTRAM) 50 MG tablet, Take 1 tablet (50 mg total) by mouth every 6 (six) hours as needed., Disp: 30 tablet, Rfl: 0 .  VITAMIN D PO, Take 1 tablet by mouth daily., Disp: , Rfl:   No Known Allergies   ROS  As noted in HPI.   Physical Exam  BP (!) 142/82 (BP Location: Right Arm)   Pulse 77   Temp 98.6 F (37 C) (Oral)   Resp 18   LMP  06/03/2011   SpO2 98%   Constitutional: Well developed, well nourished, no acute distress Eyes:  EOMI, conjunctiva normal bilaterally HENT: Normocephalic, atraumatic,mucus membranes moist Respiratory: Normal inspiratory effort, lungs clear bilaterally.  Positive tenderness lower thoracic region starting several inches away from the back and radiating to the front in the T7/T8 dermatome.  There is no rash in this area.  No bruising, evidence of trauma, crepitus. Cardiovascular: Normal rate OZ:[id number], soft, nontender nondistended active bowel  sounds.  No guarding, rebound.  No suprapubic, flank tenderness no appreciable splenomegaly. Back: No CVAT.  No midline thoracic tenderness. skin: No rash, skin intact Musculoskeletal: Calves symmetric nontender no edema no deformities Neurologic: Alert & oriented x 3, no focal neuro deficits Psychiatric: Speech and behavior appropriate   ED Course   Medications - No data to display  Orders Placed This Encounter  Procedures  . DG Chest 2 View    Standing Status:   Standing    Number of Occurrences:   1    Order Specific Question:   Reason for Exam (SYMPTOM  OR DIAGNOSIS REQUIRED)    Answer:   L sided thoracic pain tenderness r/o PNA, PTX, effusion  . POCT urinalysis dip (device)    Standing Status:   Standing    Number of Occurrences:   1  . ED EKG    Standing Status:   Standing    Number of Occurrences:   1    Order Specific Question:   Reason for Exam    Answer:   Chest Pain    Results for orders placed or performed during the hospital encounter of 03/10/19 (from the past 24 hour(s))  POCT urinalysis dip (device)     Status: Abnormal   Collection Time: 03/10/19 11:11 AM  Result Value Ref Range   Glucose, UA NEGATIVE NEGATIVE mg/dL   Bilirubin Urine NEGATIVE NEGATIVE   Ketones, ur NEGATIVE NEGATIVE mg/dL   Specific Gravity, Urine 1.025 1.005 - 1.030   Hgb urine dipstick TRACE (A) NEGATIVE   pH 6.0 5.0 - 8.0   Protein, ur  NEGATIVE NEGATIVE mg/dL   Urobilinogen, UA 0.2 0.0 - 1.0 mg/dL   Nitrite NEGATIVE NEGATIVE   Leukocytes,Ua NEGATIVE NEGATIVE   DG Chest 2 View  Result Date: 03/10/2019 CLINICAL DATA:  Left-sided thoracic pain and tenderness, rule out pneumonia, pneumothorax, effusion EXAM: CHEST - 2 VIEW COMPARISON:  07/10/2018 FINDINGS: The heart size and mediastinal contours are within normal limits. Both lungs are clear. Disc degenerative disease of the thoracic spine. IMPRESSION: No acute abnormality of the lungs. No radiographic findings to explain left-sided pain and tenderness. Electronically Signed   By: Lauralyn Primes M.D.   On: 03/10/2019 12:07    ED Clinical Impression  1. Acute left-sided thoracic back pain   2. Other microscopic hematuria   3. Urinary frequency      ED Assessment/Plan  1.  Thoracic pain.  She reports hypersensitivity and has tenderness in a left-sided dermatomal distribution.  States Flexeril helps.  Suspect shingles although the rash has not developed yet.  If her rash does develop, discussed with patient that this is shingles and provider on duty to please call in a prescription of Valtrex.  She is to continue Tylenol 1000 mg 3-4 times a day.  We can also try some Zanaflex in case this is musculoskeletal.  It does not appear to be an intra-abdominal process, ED pyelonephritis.  Will check chest x-ray rule out pneumonia pneumothorax effusion.  Doubt rib fracture.  We will also check EKG given cardiac history although she states that the symptoms are not similar to when she had problems with her heart.   Reviewed imaging independently.  Degenerative disc disease of the thoracic spine.  No pulmonary abnormality.  Normal heart size.  See radiology report for full details.  EKG: Normal sinus rhythm, rate 73.  Normal axis, normal intervals.  No hypertrophy.  No ST-T wave changes.  2.  Urinary frequency for 2  weeks.  Trace hematuria on UA.  No evidence of UTI.  No glucosuria no history  of diabetes.  Unsure as to the exact etiology of this, however she will follow-up with her PMD for repeat urinalysis in a week.  Discussed labs, imaging, MDM, treatment plan, and plan for follow-up with patient. Discussed sn/sx that should prompt return to the ED. patient agrees with plan.   Meds ordered this encounter  Medications  . tiZANidine (ZANAFLEX) 4 MG tablet    Sig: Take 1 tablet (4 mg total) by mouth every 8 (eight) hours as needed for muscle spasms.    Dispense:  30 tablet    Refill:  0    *This clinic note was created using Scientist, clinical (histocompatibility and immunogenetics). Therefore, there may be occasional mistakes despite careful proofreading.   ?   Domenick Gong, MD 03/10/19 1326

## 2019-03-14 ENCOUNTER — Encounter: Payer: Self-pay | Admitting: Internal Medicine

## 2019-03-14 ENCOUNTER — Other Ambulatory Visit: Payer: Self-pay

## 2019-03-14 ENCOUNTER — Ambulatory Visit: Payer: 59 | Admitting: Internal Medicine

## 2019-03-14 VITALS — BP 128/84 | HR 70 | Temp 98.7°F | Ht 61.6 in | Wt 195.4 lb

## 2019-03-14 DIAGNOSIS — M546 Pain in thoracic spine: Secondary | ICD-10-CM | POA: Diagnosis not present

## 2019-03-14 DIAGNOSIS — M5416 Radiculopathy, lumbar region: Secondary | ICD-10-CM | POA: Diagnosis not present

## 2019-03-14 DIAGNOSIS — R3129 Other microscopic hematuria: Secondary | ICD-10-CM

## 2019-03-14 DIAGNOSIS — R7309 Other abnormal glucose: Secondary | ICD-10-CM

## 2019-03-14 DIAGNOSIS — I10 Essential (primary) hypertension: Secondary | ICD-10-CM

## 2019-03-14 DIAGNOSIS — Z712 Person consulting for explanation of examination or test findings: Secondary | ICD-10-CM

## 2019-03-14 DIAGNOSIS — G8929 Other chronic pain: Secondary | ICD-10-CM

## 2019-03-14 DIAGNOSIS — Z6836 Body mass index (BMI) 36.0-36.9, adult: Secondary | ICD-10-CM

## 2019-03-14 MED ORDER — TRAMADOL HCL 50 MG PO TABS: 50.0000 mg | ORAL_TABLET | Freq: Four times a day (QID) | ORAL | 0 refills | Status: DC | PRN

## 2019-03-14 NOTE — Patient Instructions (Signed)

## 2019-03-14 NOTE — Progress Notes (Signed)
This visit occurred during the SARS-CoV-2 public health emergency.  Safety protocols were in place, including screening questions prior to the visit, additional usage of staff PPE, and extensive cleaning of exam room while observing appropriate contact time as indicated for disinfecting solutions.  Subjective:     Patient ID: [name] , female    DOB: [birth year] , 59 y.o.   MRN: [id number]   Chief Complaint  Patient presents with  . Hypertension  . Back Pain    Radiates from back to side    HPI  She is here today for BP check. She reports compliance with meds.   She is also concerned about a new back pain she has. It is her mid-back. Denies fall/trauma. Has chronic LBP, but this pain is different. She denies LE weakness/paresthesias. She works from home, so she is seated most of the day.   Hypertension This is a chronic problem. The current episode started more than 1 year ago. The problem has been gradually improving since onset. The problem is controlled. Pertinent negatives include no blurred vision, chest pain, palpitations or shortness of breath. The current treatment provides moderate improvement. Compliance problems include exercise.   Back Pain This is a new problem. Pertinent negatives include no chest pain.     Past Medical History:  Diagnosis Date  . Allergy   . Arthritis    back   . Atypical chest pain 06/30/2016  . Back pain    DUE TO INJURY AT WORK ON05/2013  . Bruises easily   . Chronic lower back pain   . Coronary artery disease   . Depression    was on meds 2 yrs ago   . Headache(784.0)    rarely  . History of bronchitis    last time about 67yr ago   . Hypertension    takes Benicar daily  . Hypothyroidism 06/30/2016  . Vitamin D deficiency    takes Vitamin D 2 times/wk  . Weakness    and numbness right foot     Family History  Problem Relation Age of Onset  . Heart disease Mother   . CAD Mother   . Stroke Mother   . Heart disease Sister    . Heart attack Sister   . Liver disease Brother   . Other Father        unknown medical history  . CAD Brother   . Colon cancer Neg Hx   . Esophageal cancer Neg Hx   . Rectal cancer Neg Hx   . Stomach cancer Neg Hx      Current Outpatient Medications:  .  acetaminophen (TYLENOL) 500 MG tablet, Take 1,000 mg by mouth every 6 (six) hours as needed for headache (pain)., Disp: , Rfl:  .  aspirin EC 81 MG EC tablet, Take 1 tablet (81 mg total) by mouth daily., Disp:  , Rfl:  .  atorvastatin (LIPITOR) 80 MG tablet, TAKE 1 TABLET (80 MG TOTAL) BY MOUTH DAILY AT 6 PM., Disp: 90 tablet, Rfl: 3 .  carvedilol (COREG) 6.25 MG tablet, TAKE 1 TABLET (6.25 MG TOTAL) BY MOUTH 2 (TWO) TIMES DAILY WITH A MEAL., Disp: 180 tablet, Rfl: 2 .  clopidogrel (PLAVIX) 75 MG tablet, TAKE 1 TABLET (75 MG TOTAL) BY MOUTH DAILY WITH BREAKFAST., Disp: 90 tablet, Rfl: 3 .  Diclofenac Potassium,Migraine, (CAMBIA) 50 MG PACK, Take 50 mg by mouth as needed., Disp: 12 each, Rfl: 11 .  fluticasone (FLONASE) 50 MCG/ACT nasal spray, Place 1  spray into both nostrils daily as needed for allergies or rhinitis. , Disp: , Rfl:  .  levocetirizine (XYZAL) 5 MG tablet, TAKE 1 TABLET BY MOUTH EVERY DAY (Patient taking differently: Take 5 mg by mouth daily. ), Disp: 90 tablet, Rfl: 1 .  Magnesium 250 MG TABS, TAKE 1 TABLET BY MOUTH DAILY WITH EVENING MEALS, Disp: 30 tablet, Rfl: 2 .  nitroGLYCERIN (NITROSTAT) 0.4 MG SL tablet, Place 1 tablet (0.4 mg total) under the tongue every 5 (five) minutes as needed for chest pain., Disp: 30 tablet, Rfl: 1 .  olmesartan-hydrochlorothiazide (BENICAR HCT) 40-25 MG tablet, TAKE 1 TABLET BY MOUTH EVERY DAY, Disp: 90 tablet, Rfl: 0 .  tiZANidine (ZANAFLEX) 4 MG tablet, Take 1 tablet (4 mg total) by mouth every 8 (eight) hours as needed for muscle spasms., Disp: 30 tablet, Rfl: 0 .  topiramate (TOPAMAX) 100 MG tablet, Take 2 tablets (200 mg total) by mouth at bedtime., Disp: 60 tablet, Rfl: 11 .   traMADol (ULTRAM) 50 MG tablet, Take 1 tablet (50 mg total) by mouth every 6 (six) hours as needed., Disp: 30 tablet, Rfl: 0 .  VITAMIN D PO, Take 1 tablet by mouth daily., Disp: , Rfl:    No Known Allergies   Review of Systems  Constitutional: Negative.   Eyes: Negative for blurred vision.  Respiratory: Negative.  Negative for shortness of breath.   Cardiovascular: Negative.  Negative for chest pain and palpitations.  Gastrointestinal: Negative.   Musculoskeletal: Positive for back pain and myalgias.  Neurological: Negative.   Psychiatric/Behavioral: Negative.      Today's Vitals   03/14/19 1137  BP: 128/84  Pulse: 70  Temp: 98.7 F (37.1 C)  Weight: 195 lb 6.4 oz (88.6 kg)  Height: 5' 1.6" (1.565 m)  PainSc: 8   PainLoc: Back   Body mass index is 36.2 kg/m.   Objective:  Physical Exam Vitals and nursing note reviewed.  Constitutional:      Appearance: Normal appearance. She is obese.  HENT:     Head: Normocephalic and atraumatic.  Cardiovascular:     Rate and Rhythm: Normal rate and regular rhythm.     Heart sounds: Normal heart sounds.  Pulmonary:     Effort: Pulmonary effort is normal.     Breath sounds: Normal breath sounds.  Musculoskeletal:     Thoracic back: Tenderness present. No signs of trauma or bony tenderness.       Back:     Comments: Tenderness to palpation  Skin:    General: Skin is warm.  Neurological:     General: No focal deficit present.     Mental Status: She is alert.  Psychiatric:        Mood and Affect: Mood normal.        Behavior: Behavior normal.         Assessment And Plan:    1. Essential hypertension, benign  Chronic, well controlled. She will continue with current meds. She is encouraged to avoid adding salt to her foods. I will check renal function today.   - CMP14+EGFR  2. Acute left-sided thoracic back pain  2/27 ED notes reviewed in full detail during and after her visit.  She is advised to notify me immediately  if a rash develops.  She was given stretching exercises to perform during her work day. Also advised to apply topical pain rub to affected area as needed. She will let me know if her sx persist.   3. Microscopic  hematuria  She agrees to rto next week for repeat urinalysis. I will make further recommendations at that time.   4. Chronic lumbar radiculopathy  Chronic. She plans to return to Dr. Nelva Bush for pain management. She was given rx tramadol to use prn. Review of the [place] CSRS was performed in accordance of the [place] prior to dispensing any controlled drugs.   4. Other abnormal glucose  HER A1C HAS BEEN ELEVATED IN THE PAST. I WILL CHECK AN A1C, BMET TODAY. SHE WAS ENCOURAGED TO AVOID SUGARY BEVERAGES AND PROCESSED FOODS INCLUDNG BREADS, RICE AND PASTA.  - Hemoglobin A1c  5. Class 2 severe obesity due to excess calories with serious comorbidity and body mass index (BMI) of 36.0 to 36.9 in adult [facility])  She is encouraged to strive for BMI less than 30 to decrease cardiac risk. She is advised to aim for at least 30 minutes five days per week.       Maximino Greenland, MD    THE PATIENT IS ENCOURAGED TO PRACTICE SOCIAL DISTANCING DUE TO THE COVID-19 PANDEMIC.

## 2019-03-15 LAB — CMP14+EGFR
ALT: 23 IU/L (ref 0–32)
AST: 19 IU/L (ref 0–40)
Albumin/Globulin Ratio: 1.5 (ref 1.2–2.2)
Albumin: 4.6 g/dL (ref 3.8–4.9)
Alkaline Phosphatase: 100 IU/L (ref 39–117)
BUN/Creatinine Ratio: 19 (ref 9–23)
BUN: 15 mg/dL (ref 6–24)
Bilirubin Total: 0.6 mg/dL (ref 0.0–1.2)
CO2: 20 mmol/L (ref 20–29)
Calcium: 10 mg/dL (ref 8.7–10.2)
Chloride: 104 mmol/L (ref 96–106)
Creatinine, Ser: 0.79 mg/dL (ref 0.57–1.00)
GFR calc Af Amer: 95 mL/min/{1.73_m2} (ref >59)
GFR calc non Af Amer: 82 mL/min/{1.73_m2} (ref >59)
Globulin, Total: 3.1 g/dL (ref 1.5–4.5)
Glucose: 97 mg/dL (ref 65–99)
Potassium: 4.3 mmol/L (ref 3.5–5.2)
Sodium: 146 mmol/L — ABNORMAL HIGH (ref 134–144)
Total Protein: 7.7 g/dL (ref 6.0–8.5)

## 2019-03-15 LAB — HEMOGLOBIN A1C
Est. average glucose Bld gHb Est-mCnc: 134 mg/dL
Hgb A1c MFr Bld: 6.3 % — ABNORMAL HIGH (ref 4.8–5.6)

## 2019-04-03 ENCOUNTER — Other Ambulatory Visit: Payer: Self-pay | Admitting: Internal Medicine

## 2019-04-04 LAB — COMPREHENSIVE METABOLIC PANEL
ALT: 23 IU/L (ref 0–32)
AST: 18 IU/L (ref 0–40)
Albumin/Globulin Ratio: 1.6 (ref 1.2–2.2)
Albumin: 4.5 g/dL (ref 3.8–4.9)
Alkaline Phosphatase: 94 IU/L (ref 39–117)
BUN/Creatinine Ratio: 18 (ref 9–23)
BUN: 17 mg/dL (ref 6–24)
Bilirubin Total: 0.7 mg/dL (ref 0.0–1.2)
CO2: 25 mmol/L (ref 20–29)
Calcium: 10 mg/dL (ref 8.7–10.2)
Chloride: 98 mmol/L (ref 96–106)
Creatinine, Ser: 0.93 mg/dL (ref 0.57–1.00)
GFR calc Af Amer: 78 mL/min/{1.73_m2} (ref >59)
GFR calc non Af Amer: 67 mL/min/{1.73_m2} (ref >59)
Globulin, Total: 2.9 g/dL (ref 1.5–4.5)
Glucose: 103 mg/dL — ABNORMAL HIGH (ref 65–99)
Potassium: 4.4 mmol/L (ref 3.5–5.2)
Sodium: 137 mmol/L (ref 134–144)
Total Protein: 7.4 g/dL (ref 6.0–8.5)

## 2019-04-04 LAB — LIPID PANEL
Chol/HDL Ratio: 3 ratio (ref 0.0–4.4)
Cholesterol, Total: 140 mg/dL (ref 100–199)
HDL: 46 mg/dL (ref >39)
LDL Chol Calc (NIH): 80 mg/dL (ref 0–99)
Triglycerides: 67 mg/dL (ref 0–149)
VLDL Cholesterol Cal: 14 mg/dL (ref 5–40)

## 2019-04-05 ENCOUNTER — Encounter: Payer: Self-pay | Admitting: Cardiovascular Disease

## 2019-04-05 ENCOUNTER — Ambulatory Visit (INDEPENDENT_AMBULATORY_CARE_PROVIDER_SITE_OTHER): Payer: 59 | Admitting: Cardiovascular Disease

## 2019-04-05 ENCOUNTER — Telehealth: Payer: Self-pay | Admitting: Neurology

## 2019-04-05 ENCOUNTER — Other Ambulatory Visit: Payer: Self-pay

## 2019-04-05 ENCOUNTER — Other Ambulatory Visit: Payer: Self-pay | Admitting: *Deleted

## 2019-04-05 VITALS — BP 138/88 | HR 76 | Ht 61.0 in | Wt 190.0 lb

## 2019-04-05 DIAGNOSIS — I2510 Atherosclerotic heart disease of native coronary artery without angina pectoris: Secondary | ICD-10-CM

## 2019-04-05 DIAGNOSIS — I10 Essential (primary) hypertension: Secondary | ICD-10-CM | POA: Diagnosis not present

## 2019-04-05 DIAGNOSIS — G43109 Migraine with aura, not intractable, without status migrainosus: Secondary | ICD-10-CM

## 2019-04-05 NOTE — Patient Instructions (Signed)
Medication Instructions:  DECREASE YOUR BENICAR TO 1/2 TABLET DAILY  IF YOUR BLOOD PRESSURE STARTS GOING ABOVE 130/80 INCREASE TO 1/2 TABLET TWICE A DAY   *If you need a refill on your cardiac medications before your next appointment, please call your pharmacy*  Lab Work: NONE  Testing/Procedures: NONE  Follow-Up: At [facility], you and your health needs are our priority.  As part of our continuing mission to provide you with exceptional heart care, we have created designated Provider Care Teams.  These Care Teams include your primary Cardiologist (physician) and Advanced Practice Providers (APPs -  Physician Assistants and Nurse Practitioners) who all work together to provide you with the care you need, when you need it.  We recommend signing up for the patient portal called "MyChart".  Sign up information is provided on this After Visit Summary.  MyChart is used to connect with patients for Virtual Visits (Telemedicine).  Patients are able to view lab/test results, encounter notes, upcoming appointments, etc.  Non-urgent messages can be sent to your provider as well.   To learn more about what you can do with MyChart, go to [email or web address].    Your next appointment:   2 month(s)  The format for your next appointment:   Virtual Visit   Provider:   You may see Chilton Si, MD or one of the following Advanced Practice Providers on your designated Care Team:    Corine Shelter, PA-C  [place], [place]  Edd Fabian, [place]  Other Instructions WORK HARDER ON DIET AND EXERCISE

## 2019-04-05 NOTE — Telephone Encounter (Signed)
Patient called to schedule her carotid US states she has a new Scientist, forensic and would like to know what she needs to do to get it scheduled.

## 2019-04-05 NOTE — Progress Notes (Signed)
Cardiology Office Note   Date:  04/09/2019   ID:  [name], DOB 27-Jun-1960, MRN [id number]  PCP:  Dorothyann Peng, MD  Cardiologist:   Chilton Si, MD   No chief complaint on file.    History of Present Illness: [name] is a 59 y.o. female CAD s/p PCI, hypertension who presents for follow up.  She was initially seen 06/2016 for an abnormal EKG.  Her sister died of a heart attack at age 17 without any preceding symptoms.  She also has a family history of long QT syndrome. Her mother, aunt, and 2 cousins all have this disease. She also reported intermittent episodes of atypical chest pain and was referred for an ETT 07/22/16 that revealed ST depressions.  She achieved 10.1 METs on the [facility] protocol.  She was referred for left heart catheterization that showed normal coronary arteries.   [name] was also noted to have a systolic murmur.  She was referred for an echo that revealed LVEF 65-70% and grade 2 diastolic dysfunction.  Pulmonary pressures were mildly elevated (PASP 48 mmHg).  [name] presented to the hospital with chest pain 07/2018.  She underwent LHC which showed 85% mid left circumflex disease with 30 to 40% stenoses in OM 2.  She underwent PCI of the left circumflex.  Left ventriculography revealed LVEF 55 to 65%.  She followed up with Joni Reining, DNP on 07/2018 and had some wrist pain but was otherwise well.  Since her last appointment [name] has struggled with L flank pain.  She has been unable to exercise due to pain and working a lot.  She has been short of breath some days but denies chest pain.  Most days her breathing is fine.  She has mild R LE edema but no orthopnea or PND.  Her BP has been low this week and she has been feeling tired.  She notes that her BP tends to be highest in the evenings.   Past Medical History:  Diagnosis Date  . Allergy   . Arthritis    back   . Atypical chest pain 06/30/2016  . Back pain    DUE TO INJURY AT WORK  ON05/2013  . Bruises easily   . Chronic lower back pain   . Coronary artery disease   . Depression    was on meds 2 yrs ago   . Headache(784.0)    rarely  . History of bronchitis    last time about 69yrs ago   . Hypertension    takes Benicar daily  . Hypothyroidism 06/30/2016  . Vitamin D deficiency    takes Vitamin D 2 times/wk  . Weakness    and numbness right foot    Past Surgical History:  Procedure Laterality Date  . BACK SURGERY    . CARPAL TUNNEL RELEASE Right   . CESAREAN SECTION  1988  . COLONOSCOPY    . EXPLORATORY LAPAROTOMY  1991   "took out fatty tumor" (11/09/2012)  . LEFT HEART CATH AND CORONARY ANGIOGRAPHY N/A 08/06/2016   Procedure: Left Heart Cath and Coronary Angiography;  Surgeon: Lyn Records, MD;  [facility] INVAS[facility] CV LAB;  Service: Cardiovascular;  Laterality: N/A;  . LEFT HEART CATH AND CORONARY ANGIOGRAPHY N/A 07/12/2018   Procedure: LEFT HEART CATH AND CORONARY ANGIOGRAPHY;  Surgeon: Marykay Lex, MD;  Location: [facility] INVASIVE CV LAB;  Service: Cardiovascular;  Laterality: N/A;  . LUMBAR LAMINECTOMY/DECOMPRESSION MICRODISCECTOMY  11/09/2012   "L3-4" (  11/09/2012)  . LUMBAR LAMINECTOMY/DECOMPRESSION MICRODISCECTOMY N/A 11/09/2012   Procedure: L3-L4 DECOMPRESSION AND MICRODISCECTOMY   (1 LEVEL);  Surgeon: Venita Lick, MD;  Location: [facility] OR;  Service: Orthopedics;  Laterality: N/A;     Current Outpatient Medications  Medication Sig Dispense Refill  . acetaminophen (TYLENOL) 500 MG tablet Take 1,000 mg by mouth every 6 (six) hours as needed for headache (pain).    Marland Kitchen aspirin EC 81 MG EC tablet Take 1 tablet (81 mg total) by mouth daily.    Marland Kitchen atorvastatin (LIPITOR) 80 MG tablet TAKE 1 TABLET (80 MG TOTAL) BY MOUTH DAILY AT 6 PM. 90 tablet 3  . carvedilol (COREG) 6.25 MG tablet TAKE 1 TABLET (6.25 MG TOTAL) BY MOUTH 2 (TWO) TIMES DAILY WITH A MEAL. 180 tablet 2  . clopidogrel (PLAVIX) 75 MG tablet TAKE 1 TABLET (75 MG TOTAL) BY MOUTH DAILY WITH  BREAKFAST. 90 tablet 3  . Diclofenac Potassium,Migraine, (CAMBIA) 50 MG PACK Take 50 mg by mouth as needed. 12 each 11  . fluticasone (FLONASE) 50 MCG/ACT nasal spray SPRAY 1 SPRAY INTO EACH NOSTRIL EVERY DAY 16 mL 2  . levocetirizine (XYZAL) 5 MG tablet TAKE 1 TABLET BY MOUTH EVERY DAY (Patient taking differently: Take 5 mg by mouth daily. ) 90 tablet 1  . Magnesium 250 MG TABS TAKE 1 TABLET BY MOUTH DAILY WITH EVENING MEALS 30 tablet 2  . nitroGLYCERIN (NITROSTAT) 0.4 MG SL tablet Place 1 tablet (0.4 mg total) under the tongue every 5 (five) minutes as needed for chest pain. 30 tablet 1  . olmesartan-hydrochlorothiazide (BENICAR HCT) 40-25 MG tablet Take 1 tablet by mouth as directed. 1/2 TABLET BY MOUTH DAILY. IF BLOOD PRESSURE ABOVE 130/80 INCREASE TO 1/2 TABLET TWICE A DAY    . tiZANidine (ZANAFLEX) 4 MG tablet Take 1 tablet (4 mg total) by mouth every 8 (eight) hours as needed for muscle spasms. 30 tablet 0  . topiramate (TOPAMAX) 100 MG tablet Take 2 tablets (200 mg total) by mouth at bedtime. 60 tablet 11  . traMADol (ULTRAM) 50 MG tablet Take 1 tablet (50 mg total) by mouth every 6 (six) hours as needed. 30 tablet 0  . VITAMIN D PO Take 1 tablet by mouth daily.     No current facility-administered medications for this visit.    Allergies:   Patient has no known allergies.    Social History:  The patient  reports that she has never smoked. She has never used smokeless tobacco. She reports current alcohol use of about 7.0 standard drinks of alcohol per week. She reports that she does not use drugs.   Family History:  The patient's family history includes CAD in her brother and mother; Heart attack in her sister; Heart disease in her mother and sister; Liver disease in her brother; Other in her father; Stroke in her mother.    ROS:  Please see the history of present illness.   Otherwise, review of systems are positive for none.   All other systems are reviewed and negative.     PHYSICAL EXAM: VS:  BP 138/88   Pulse 76   Ht 5\' 1"  (1.549 m)   Wt 190 lb (86.2 kg)   LMP 06/03/2011   SpO2 98%   BMI 35.90 kg/m  , BMI Body mass index is 35.9 kg/m. GENERAL:  Well appearing.  No acute distress. HEENT:  Pupils equal round and reactive, fundi not visualized, oral mucosa unremarkable NECK:  No jugular venous distention, waveform within  normal limits, carotid upstroke brisk and symmetric, +R carotid bruit LUNGS:  Clear to auscultation bilaterally.  No crackles, wheezes or rhonchi. HEART:  RRR.  PMI not displaced or sustained,S1 and S2 within normal limits, no S3, no S4, no clicks, no rubs, no murmur ABD:  Flat, positive bowel sounds normal in frequency in pitch, no bruits, no rebound, no guarding, no midline pulsatile mass, no hepatomegaly, no splenomegaly EXT:  2 plus pulses throughout, no edema, no cyanosis no clubbing SKIN:  No rashes no nodules NEURO:  Cranial nerves II through XII grossly intact, motor grossly intact throughout PSYCH:  Cognitively intact, oriented to person place and time  EKG:  EKG is not ordered today. The ekg ordered 06/30/16 demonstrates sinus rhythm. Rate 78 bpm.    LHC 07/12/18:  CULPRIT LESION: Mid Cx lesion is 85% stenosed with 30-40% stenosed side branch in 2nd Mrg.  A drug-eluting stent was successfully placed (crossing OM2) using a STENT SYNERGY DES 3X20 - post-dilated to 3.3 mm  Post intervention, there is a 0% residual stenosis.  -----------------------------------------  Mid LAD lesion is 30% stenosed.  Prox RCA lesion is 25% stenosed.  -----------------------------------------  The left ventricular systolic function is normal. The left ventricular ejection fraction is 55-65% by visual estimate.  LV end diastolic pressure is normal.   SUMMARY  Severe Single Vessel CAD mLCX (@ OM2) - s/p Successful DES PCI (Synergy DES 3.0 x 20 -- 3.3 mm)  Otherwise mild disease in LAD & RCA  Normal LVEF & normal to low  EDP  RECOMMENDATIONS  Overnight monitoring post-PCI with TR Band Removal   Continue aggressive Risk Factor Modification  Echo 07/13/16: Study Conclusions  - Left ventricle: The cavity size was normal. Wall thickness was   normal. Systolic function was vigorous. The estimated ejection   fraction was in the range of 65% to 70%. Wall motion was normal;   there were no regional wall motion abnormalities. Features are   consistent with a pseudonormal left ventricular filling pattern,   with concomitant abnormal relaxation and increased filling   pressure (grade 2 diastolic dysfunction). - Mitral valve: There was mild regurgitation. - Pulmonary arteries: Systolic pressure was moderately increased.   PA peak pressure: 48 mm Hg (S).  ETT 07/22/16: Blood pressure demonstrated a normal response to exercise.  Clinically negative Electrically positive for ischemia with significant ST changes beginning in Stage II.  Excellent exercise tolerance  Echo 07/13/16: Study Conclusions  - Left ventricle: The cavity size was normal. Wall thickness was   normal. Systolic function was vigorous. The estimated ejection   fraction was in the range of 65% to 70%. Wall motion was normal;   there were no regional wall motion abnormalities. Features are   consistent with a pseudonormal left ventricular filling pattern,   with concomitant abnormal relaxation and increased filling   pressure (grade 2 diastolic dysfunction). - Mitral valve: There was mild regurgitation. - Pulmonary arteries: Systolic pressure was moderately increased.   PA peak pressure: 48 mm Hg (S).  Recent Labs: 09/14/2018: Hemoglobin 11.7; Platelets 343 04/04/2019: ALT 23; BUN 17; Creatinine, Ser 0.93; Potassium 4.4; Sodium 137   06/17/16: TSH 2.7 Hemoglobin A1c 5.9 Total cholesterol 219, triglycerides 83, HDL 67, LDL 139  Lipid Panel    Component Value Date/Time   CHOL 140 04/04/2019 0820   TRIG 67 04/04/2019 0820   HDL 46  04/04/2019 0820   CHOLHDL 3.0 04/04/2019 0820   CHOLHDL 4.3 07/11/2018 0612   VLDL 18 07/11/2018  0612   LDLCALC 80 04/04/2019 0820      Wt Readings from Last 3 Encounters:  04/05/19 190 lb (86.2 kg)  03/14/19 195 lb 6.4 oz (88.6 kg)  12/21/18 198 lb 12.8 oz (90.2 kg)      ASSESSMENT AND PLAN:  # CAD: # Pure hypercholesterolemia: [name] presented with unstable angina 06/2018 and had a DES in the LCX.  She is doing well.  Plan on DAPT for 1 year then aspirin only.  Continue atorvastatin and carvedilol.    # Hypertension:  Blood pressure has been running low and she has been feeling tired.  We will reduce olmsartan/HCTZ to 20/12.5mg  daily.  If her BP is >130/80 she can switch this to twice daily.  Continue carvedilol.  Current medicines are reviewed at length with the patient today.  The patient does not have concerns regarding medicines.  The following changes have been made: reduce Benicar  Labs/ tests ordered today include:   No orders of the defined types were placed in this encounter.    Disposition:   FU with Trayshawn Durkin C. Duke Salvia, MD, [facility] in 2 months virtually.    This note was written with the assistance of speech recognition software.  Please excuse any transcriptional errors.  Signed, Trey Bebee C. Duke Salvia, MD, [facility]  04/09/2019 8:33 AM    [facility]

## 2019-04-05 NOTE — Telephone Encounter (Signed)
Patient came buy and brought her insurance cared for scanning . I will move forward with PA process for Doppler . Thanks Annabelle Harman .

## 2019-04-05 NOTE — Telephone Encounter (Signed)
Called patient and asked her could she come to the office so we could could scan her new insurance card in . Relayed to patient to let the front desk know so I can start process. Thanks Annabelle Harman .

## 2019-04-09 ENCOUNTER — Encounter: Payer: Self-pay | Admitting: Cardiovascular Disease

## 2019-04-09 DIAGNOSIS — I2510 Atherosclerotic heart disease of native coronary artery without angina pectoris: Secondary | ICD-10-CM | POA: Insufficient documentation

## 2019-04-11 ENCOUNTER — Other Ambulatory Visit: Payer: Self-pay

## 2019-04-19 ENCOUNTER — Other Ambulatory Visit: Payer: Self-pay

## 2019-04-19 DIAGNOSIS — Z79899 Other long term (current) drug therapy: Secondary | ICD-10-CM

## 2019-04-19 DIAGNOSIS — E7800 Pure hypercholesterolemia, unspecified: Secondary | ICD-10-CM

## 2019-04-19 DIAGNOSIS — I2510 Atherosclerotic heart disease of native coronary artery without angina pectoris: Secondary | ICD-10-CM

## 2019-04-19 MED ORDER — EZETIMIBE 10 MG PO TABS: 10.0000 mg | ORAL_TABLET | Freq: Every day | ORAL | 4 refills | Status: DC

## 2019-04-27 ENCOUNTER — Other Ambulatory Visit: Payer: Self-pay | Admitting: Internal Medicine

## 2019-04-30 NOTE — Telephone Encounter (Signed)
Patient is requesting a refill of TRAMADOL HCL 50 MG TABLET  Last OV-03/14/2019 Next OV- 09/18/2019

## 2019-05-17 ENCOUNTER — Other Ambulatory Visit: Payer: Self-pay | Admitting: Internal Medicine

## 2019-05-17 NOTE — Telephone Encounter (Signed)
Please advise patient that I do not practice chronic pain management. Why does she need recurrent pain meds? Would she like me to send her to a pain specialist?

## 2019-05-17 NOTE — Telephone Encounter (Signed)
Patient is requesting a refill of TRAMADOL HCL 50 MG TABLET  Last OV- 03/14/19 Next OV- 09/18/19

## 2019-05-21 NOTE — Telephone Encounter (Signed)
Pt states that she would like to see someone for the back pain. But wanted to inform you that she has [facility] and doesn't know how that works. She is no longer able to see Dr Shon Baton (orthopedic) with [employer]. Wants to know if you can send some until she can see someone.

## 2019-05-25 ENCOUNTER — Other Ambulatory Visit: Payer: Self-pay | Admitting: Internal Medicine

## 2019-05-31 ENCOUNTER — Telehealth (INDEPENDENT_AMBULATORY_CARE_PROVIDER_SITE_OTHER): Payer: 59 | Admitting: Cardiovascular Disease

## 2019-05-31 ENCOUNTER — Encounter: Payer: Self-pay | Admitting: Cardiovascular Disease

## 2019-05-31 VITALS — BP 136/78 | HR 78 | Ht 61.0 in | Wt 186.0 lb

## 2019-05-31 DIAGNOSIS — I2510 Atherosclerotic heart disease of native coronary artery without angina pectoris: Secondary | ICD-10-CM

## 2019-05-31 DIAGNOSIS — E7800 Pure hypercholesterolemia, unspecified: Secondary | ICD-10-CM | POA: Diagnosis not present

## 2019-05-31 DIAGNOSIS — I10 Essential (primary) hypertension: Secondary | ICD-10-CM | POA: Diagnosis not present

## 2019-05-31 HISTORY — DX: Pure hypercholesterolemia, unspecified: E78.00

## 2019-05-31 NOTE — Progress Notes (Signed)
Virtual Visit via Telephone Note   This visit type was conducted due to national recommendations for restrictions regarding the COVID-19 Pandemic (e.g. social distancing) in an effort to limit this patient's exposure and mitigate transmission in our community.  Due to her co-morbid illnesses, this patient is at least at moderate risk for complications without adequate follow up.  This format is felt to be most appropriate for this patient at this time.  The patient did not have access to video technology/had technical difficulties with video requiring transitioning to audio format only (telephone).  All issues noted in this document were discussed and addressed.  No physical exam could be performed with this format.  Please refer to the patient's chart for her  consent to telehealth for [facility].   The patient was identified using 2 identifiers.  Date:  05/31/2019   ID:  [name], DOB 1960-11-24, MRN [id number]  Patient Location: Home Provider Location: Home  PCP:  Glendale Chard, MD  Cardiologist:  Skeet Latch, MD  Electrophysiologist:  None   Evaluation Performed:  Follow-Up Visit  Chief Complaint:  CAD  History of Present Illness:     The patient does not have symptoms concerning for COVID-19 infection (fever, chills, cough, or new shortness of breath).   [name] is a 59 y.o. female CAD s/p PCI, hypertension who presents for follow up.  She was initially seen 06/2016 for an abnormal EKG.  Her sister died of a heart attack at age 74 without any preceding symptoms.  She also has a family history of long QT syndrome. Her mother, aunt, and 2 cousins all have this disease. She also reported intermittent episodes of atypical chest pain and was referred for an ETT 07/22/16 that revealed ST depressions.  She achieved 10.1 METs on the Bruce protocol.  She was referred for left heart catheterization that showed normal coronary arteries.   [name] was also noted to  have a systolic murmur.  She was referred for an echo that revealed LVEF 65-70% and grade 2 diastolic dysfunction.  Pulmonary pressures were mildly elevated (PASP 48 mmHg).  [name] presented to the hospital with chest pain 07/2018.  She underwent LHC which showed 85% mid left circumflex disease with 30 to 40% stenoses in OM 2.  She underwent PCI of the left circumflex.  Left ventriculography revealed LVEF 55 to 65%.  She followed up with Jory Sims, DNP on 07/2018 and had some wrist pain but was otherwise well. At her last appointment her BP was running low so olmesartan/HCTZ was reduced but not controled.  She switched to taking it twice daily.  She has been walking twice daily for 25 minutes.  She feels good with exercise.  She has no exertional chest pain or shortness of breath.  Her BP has been around 130-135/70s.  She is trying to reduce her sodium intake.     Past Medical History:  Diagnosis Date  . Allergy   . Arthritis    back   . Atypical chest pain 06/30/2016  . Back pain    DUE TO INJURY AT WORK ON05/2013  . Bruises easily   . Chronic lower back pain   . Coronary artery disease   . Depression    was on meds 2 yrs ago   . Headache(784.0)    rarely  . History of bronchitis    last time about 59yrs ago   . Hypertension    takes Benicar daily  .  Hypothyroidism 06/30/2016  . Pure hypercholesterolemia 05/31/2019  . Vitamin D deficiency    takes Vitamin D 2 times/wk  . Weakness    and numbness right foot    Past Surgical History:  Procedure Laterality Date  . BACK SURGERY    . CARPAL TUNNEL RELEASE Right   . CESAREAN SECTION  1988  . COLONOSCOPY    . EXPLORATORY LAPAROTOMY  1991   "took out fatty tumor" (11/09/2012)  . LEFT HEART CATH AND CORONARY ANGIOGRAPHY N/A 08/06/2016   Procedure: Left Heart Cath and Coronary Angiography;  Surgeon: Lyn Records, MD;  Location[facility] INVASIVE CV LAB;  Service: Cardiovascular;  Laterality: N/A;  . LEFT HEART CATH AND CORONARY  ANGIOGRAPHY N/A 07/12/2018   Procedure: LEFT HEART CATH AND CORONARY ANGIOGRAPHY;  Surgeon: Marykay Lex, MD;  Location: [facility] INVASIVE CV LAB;  Service: Cardiovascular;  Laterality: N/A;  . LUMBAR LAMINECTOMY/DECOMPRESSION MICRODISCECTOMY  11/09/2012   "L3-4" (11/09/2012)  . LUMBAR LAMINECTOMY/DECOMPRESSION MICRODISCECTOMY N/A 11/09/2012   Procedure: L3-L4 DECOMPRESSION AND MICRODISCECTOMY   (1 LEVEL);  Surgeon: Venita Lick, MD;  Location: [facility] OR;  Service: Orthopedics;  Laterality: N/A;     Current Outpatient Medications  Medication Sig Dispense Refill  . acetaminophen (TYLENOL) 500 MG tablet Take 1,000 mg by mouth every 6 (six) hours as needed for headache (pain).    Marland Kitchen aspirin EC 81 MG EC tablet Take 1 tablet (81 mg total) by mouth daily.    Marland Kitchen atorvastatin (LIPITOR) 80 MG tablet TAKE 1 TABLET (80 MG TOTAL) BY MOUTH DAILY AT 6 PM. 90 tablet 3  . carvedilol (COREG) 6.25 MG tablet TAKE 1 TABLET (6.25 MG TOTAL) BY MOUTH 2 (TWO) TIMES DAILY WITH A MEAL. 180 tablet 2  . cholecalciferol (VITAMIN D3) 25 MCG (1000 UNIT) tablet Take 1,000 Units by mouth daily.    . clopidogrel (PLAVIX) 75 MG tablet TAKE 1 TABLET (75 MG TOTAL) BY MOUTH DAILY WITH BREAKFAST. 90 tablet 3  . Diclofenac Potassium,Migraine, (CAMBIA) 50 MG PACK Take 50 mg by mouth as needed. 12 each 11  . ezetimibe (ZETIA) 10 MG tablet Take 1 tablet (10 mg total) by mouth daily. 30 tablet 4  . fluticasone (FLONASE) 50 MCG/ACT nasal spray Place 2 sprays into both nostrils daily as needed for allergies or rhinitis.    Marland Kitchen levocetirizine (XYZAL) 5 MG tablet TAKE 1 TABLET BY MOUTH EVERY DAY 90 tablet 1  . nitroGLYCERIN (NITROSTAT) 0.4 MG SL tablet Place 1 tablet (0.4 mg total) under the tongue every 5 (five) minutes as needed for chest pain. 30 tablet 1  . olmesartan-hydrochlorothiazide (BENICAR HCT) 40-25 MG tablet TAKE 1 TABLET BY MOUTH EVERY DAY 90 tablet 0  . tiZANidine (ZANAFLEX) 4 MG tablet Take 1 tablet (4 mg total) by mouth every 8  (eight) hours as needed for muscle spasms. 30 tablet 0  . topiramate (TOPAMAX) 100 MG tablet Take 100 mg by mouth at bedtime.    . Magnesium 250 MG TABS TAKE 1 TABLET BY MOUTH DAILY WITH EVENING MEALS (Patient not taking: Reported on 05/31/2019) 30 tablet 2   No current facility-administered medications for this visit.    Allergies:   Patient has no known allergies.    Social History:  The patient  reports that she has never smoked. She has never used smokeless tobacco. She reports current alcohol use of about 7.0 standard drinks of alcohol per week. She reports that she does not use drugs.   Family History:  The patient's family history  includes CAD in her brother and mother; Heart attack in her sister; Heart disease in her mother and sister; Liver disease in her brother; Other in her father; Stroke in her mother.    ROS:  Please see the history of present illness.   Otherwise, review of systems are positive for none.   All other systems are reviewed and negative.    PHYSICAL EXAM: BP 136/78   Pulse 78   Ht 5\' 1"  (1.549 m)   Wt 186 lb (84.4 kg)   LMP 06/03/2011   BMI 35.14 kg/m  GENERAL: Sounds well.  No acute distress. RESP: Respirations unlabored NEURO:  Speech fluent.  Cranial nerves grossly intact.  Moves all 4 extremities freely PSYCH:  Cognitively intact, oriented to person place and time  EKG:  EKG is not ordered today. The ekg ordered 06/30/16 demonstrates sinus rhythm. Rate 78 bpm.    LHC 07/12/18:  CULPRIT LESION: Mid Cx lesion is 85% stenosed with 30-40% stenosed side branch in 2nd Mrg.  A drug-eluting stent was successfully placed (crossing OM2) using a STENT SYNERGY DES 3X20 - post-dilated to 3.3 mm  Post intervention, there is a 0% residual stenosis.  -----------------------------------------  Mid LAD lesion is 30% stenosed.  Prox RCA lesion is 25% stenosed.  -----------------------------------------  The left ventricular systolic function is normal. The  left ventricular ejection fraction is 55-65% by visual estimate.  LV end diastolic pressure is normal.   SUMMARY  Severe Single Vessel CAD mLCX (@ OM2) - s/p Successful DES PCI (Synergy DES 3.0 x 20 -- 3.3 mm)  Otherwise mild disease in LAD & RCA  Normal LVEF & normal to low EDP  RECOMMENDATIONS  Overnight monitoring post-PCI with TR Band Removal   Continue aggressive Risk Factor Modification  Echo 07/13/16: Study Conclusions  - Left ventricle: The cavity size was normal. Wall thickness was   normal. Systolic function was vigorous. The estimated ejection   fraction was in the range of 65% to 70%. Wall motion was normal;   there were no regional wall motion abnormalities. Features are   consistent with a pseudonormal left ventricular filling pattern,   with concomitant abnormal relaxation and increased filling   pressure (grade 2 diastolic dysfunction). - Mitral valve: There was mild regurgitation. - Pulmonary arteries: Systolic pressure was moderately increased.   PA peak pressure: 48 mm Hg (S).  ETT 07/22/16: Blood pressure demonstrated a normal response to exercise.  Clinically negative Electrically positive for ischemia with significant ST changes beginning in Stage II.  Excellent exercise tolerance  Echo 07/13/16: Study Conclusions  - Left ventricle: The cavity size was normal. Wall thickness was   normal. Systolic function was vigorous. The estimated ejection   fraction was in the range of 65% to 70%. Wall motion was normal;   there were no regional wall motion abnormalities. Features are   consistent with a pseudonormal left ventricular filling pattern,   with concomitant abnormal relaxation and increased filling   pressure (grade 2 diastolic dysfunction). - Mitral valve: There was mild regurgitation. - Pulmonary arteries: Systolic pressure was moderately increased.   PA peak pressure: 48 mm Hg (S).  Recent Labs: 09/14/2018: Hemoglobin 11.7; Platelets  343 04/04/2019: ALT 23; BUN 17; Creatinine, Ser 0.93; Potassium 4.4; Sodium 137   06/17/16: TSH 2.7 Hemoglobin A1c 5.9 Total cholesterol 219, triglycerides 83, HDL 67, LDL 139  Lipid Panel    Component Value Date/Time   CHOL 140 04/04/2019 0820   TRIG 67 04/04/2019 0820  HDL 46 04/04/2019 0820   CHOLHDL 3.0 04/04/2019 0820   CHOLHDL 4.3 07/11/2018 0612   VLDL 18 07/11/2018 0612   LDLCALC 80 04/04/2019 0820      Wt Readings from Last 3 Encounters:  05/31/19 186 lb (84.4 kg)  04/05/19 190 lb (86.2 kg)  03/14/19 195 lb 6.4 oz (88.6 kg)      ASSESSMENT AND PLAN:  # CAD: # Pure hypercholesterolemia: [name] presented with unstable angina 06/2018 and had a DES in the LCX.  She is doing well.  Stop clopidogrel 07/2019.  Continue aspirin indefinitely.  Continue atorvastatin, Zetia, and carvedilol.    # Hypertension:  Blood pressure is running slightly above goal.  Her systolics have been in the 130s or lower mostly.  It seems to be improving with exercise and she has identified that processed meat seems to make her blood pressure higher.  Continue carvedilol, olmesartan, and HCTZ.  Current medicines are reviewed at length with the patient today.  The patient does not have concerns regarding medicines.  The following changes have been made: reduce Benicar  Labs/ tests ordered today include:   No orders of the defined types were placed in this encounter.   COVID-19 Education: The signs and symptoms of COVID-19 were discussed with the patient and how to seek care for testing (follow up with PCP or arrange E-visit).  The importance of social distancing was discussed today.  Time:   Today, I have spent 17 minutes with the patient with telehealth technology discussing the above problems.    Disposition:   FU with Dejuan Elman C. Duke Salvia, MD, [facility] in 6 months  This note was written with the assistance of speech recognition software.  Please excuse any transcriptional  errors.  Signed, Devine Klingel C. Duke Salvia, MD, [facility]  05/31/2019 10:54 AM    [facility]

## 2019-05-31 NOTE — Patient Instructions (Addendum)
Instructions:  TAKE VITAMIN 1,000 DAILY   STOP CLOPIDOGREL (PLAVIX) July 2021   *If you need a refill on your cardiac medications before your next appointment, please call your pharmacy*  Lab Work: NONE  Testing/Procedures: NONE  Follow-Up: At [facility], you and your health needs are our priority.  As part of our continuing mission to provide you with exceptional heart care, we have created designated Provider Care Teams.  These Care Teams include your primary Cardiologist (physician) and Advanced Practice Providers (APPs -  Physician Assistants and Nurse Practitioners) who all work together to provide you with the care you need, when you need it.  We recommend signing up for the patient portal called "MyChart".  Sign up information is provided on this After Visit Summary.  MyChart is used to connect with patients for Virtual Visits (Telemedicine).  Patients are able to view lab/test results, encounter notes, upcoming appointments, etc.  Non-urgent messages can be sent to your provider as well.   To learn more about what you can do with MyChart, go to [email or web address].    Your next appointment:   12 month(s)  You will receive a reminder letter in the mail two months in advance. If you don't receive a letter, please call our office to schedule the follow-up appointment.  The format for your next appointment:   In Person  Provider:   You may see Chilton Si, MD or one of the following Advanced Practice Providers on your designated Care Team:    Corine Shelter, PA-C  [place], [place]  Edd Fabian, [place]

## 2019-06-21 ENCOUNTER — Other Ambulatory Visit: Payer: Self-pay

## 2019-06-21 ENCOUNTER — Telehealth: Payer: Self-pay | Admitting: *Deleted

## 2019-06-21 ENCOUNTER — Other Ambulatory Visit: Payer: Self-pay | Admitting: Internal Medicine

## 2019-06-21 ENCOUNTER — Telehealth: Payer: Self-pay | Admitting: Neurology

## 2019-06-21 ENCOUNTER — Ambulatory Visit (INDEPENDENT_AMBULATORY_CARE_PROVIDER_SITE_OTHER): Payer: 59 | Admitting: Neurology

## 2019-06-21 ENCOUNTER — Encounter: Payer: Self-pay | Admitting: Neurology

## 2019-06-21 VITALS — BP 145/91 | HR 73 | Ht 61.0 in | Wt 196.5 lb

## 2019-06-21 DIAGNOSIS — R1032 Left lower quadrant pain: Secondary | ICD-10-CM | POA: Diagnosis not present

## 2019-06-21 DIAGNOSIS — G43109 Migraine with aura, not intractable, without status migrainosus: Secondary | ICD-10-CM | POA: Diagnosis not present

## 2019-06-21 DIAGNOSIS — I6523 Occlusion and stenosis of bilateral carotid arteries: Secondary | ICD-10-CM | POA: Diagnosis not present

## 2019-06-21 MED ORDER — TOPIRAMATE 100 MG PO TABS: 100.0000 mg | ORAL_TABLET | Freq: Every day | ORAL | 4 refills | Status: DC

## 2019-06-21 MED ORDER — TRAMADOL HCL 50 MG PO TABS: 50.0000 mg | ORAL_TABLET | Freq: Two times a day (BID) | ORAL | 0 refills | Status: DC | PRN

## 2019-06-21 MED ORDER — MELOXICAM 15 MG PO TABS: 15.0000 mg | ORAL_TABLET | Freq: Every day | ORAL | 6 refills | Status: DC | PRN

## 2019-06-21 NOTE — Telephone Encounter (Signed)
PA for Tramadol completed on covermymeds (key: [id number]). Pt has coverage through [employer] [phone]). Decision pending.

## 2019-06-21 NOTE — Progress Notes (Signed)
PATIENT: [name] DOB: 1960-03-21  REASON FOR VISIT: follow up HISTORY FROM: patient  HISTORY OF PRESENT ILLNESS: Today 06/21/19  HISTORY [name] is a 59 year old female, seen in request by her primary care physician Dr. Baird Cancer, [name] for episode of visual loss, headaches, initial evaluation was on August 23, 2018.  I have reviewed and summarized the referring note from the referring physician.  She had past medical history of hyperlipidemia, hypertension, coronary artery disease, status post stent on July 12, 2018, currently taking aspirin and Plavix  She denies a previous history of migraine headaches, on August 18, 2018, while sitting at the computer, she noticed difficulty reading, then bilateral vision went out, she was able to lie down with eyes closed resting for 30 minutes, her vision came back normal, at the same time she noticed mild left temporal region throbbing headaches, when she went back to look at computer again, she experienced visual loss bilaterally again, was sent to the emergency room  I personally reviewed MRI of the brain no acute abnormality  MRA of neck: 50% stenosis of left internal carotid artery, critical stenosis of proximal right internal carotid artery, estimated to be greater than 80% stenosis, 50% stenosis of the left internal carotid artery  MRA of the brain showed no significant large vessel stenosis intracranially  She experienced a severe headache on August 08/2018, severe holoacranial pounding headache with associated light noise sensitivity, nauseous that was helped by Tylenol  Laboratory evaluations in 2020, LDL 148, cholesterol 217, negative C-reactive protein, ESR was mildly elevated 50, UDS was negative, normal TSH, A1c 5.9  UPDATE June 21 2019: Her headache has much improved, tolerating Topamax 100 mg every night  She remained on aspirin and Plavix, she did not have ultrasound of carotid artery to follow-up her  severe right carotid artery stenosis, more than 80% based on MRA in 2020  She also complains of low back pain, radiating pain to left hip, left groin area    REVIEW OF SYSTEMS: Out of a complete 14 system review of symptoms, the patient complains only of the following symptoms, and all other reviewed systems are negative.  As above ALLERGIES: No Known Allergies  HOME MEDICATIONS: Outpatient Medications Prior to Visit  Medication Sig Dispense Refill  . acetaminophen (TYLENOL) 500 MG tablet Take 1,000 mg by mouth every 6 (six) hours as needed for headache (pain).    Marland Kitchen aspirin EC 81 MG EC tablet Take 1 tablet (81 mg total) by mouth daily.    Marland Kitchen atorvastatin (LIPITOR) 80 MG tablet TAKE 1 TABLET (80 MG TOTAL) BY MOUTH DAILY AT 6 PM. 90 tablet 3  . carvedilol (COREG) 6.25 MG tablet TAKE 1 TABLET (6.25 MG TOTAL) BY MOUTH 2 (TWO) TIMES DAILY WITH A MEAL. 180 tablet 2  . cholecalciferol (VITAMIN D3) 25 MCG (1000 UNIT) tablet Take 1,000 Units by mouth daily.    . clopidogrel (PLAVIX) 75 MG tablet TAKE 1 TABLET (75 MG TOTAL) BY MOUTH DAILY WITH BREAKFAST. 90 tablet 3  . Diclofenac Potassium,Migraine, (CAMBIA) 50 MG PACK Take 50 mg by mouth as needed. 12 each 11  . ezetimibe (ZETIA) 10 MG tablet Take 1 tablet (10 mg total) by mouth daily. 30 tablet 4  . fluticasone (FLONASE) 50 MCG/ACT nasal spray Place 2 sprays into both nostrils daily as needed for allergies or rhinitis.    Marland Kitchen levocetirizine (XYZAL) 5 MG tablet TAKE 1 TABLET BY MOUTH EVERY DAY 90 tablet 1  . Magnesium 250  MG TABS TAKE 1 TABLET BY MOUTH DAILY WITH EVENING MEALS 30 tablet 2  . nitroGLYCERIN (NITROSTAT) 0.4 MG SL tablet Place 1 tablet (0.4 mg total) under the tongue every 5 (five) minutes as needed for chest pain. 30 tablet 1  . olmesartan-hydrochlorothiazide (BENICAR HCT) 40-25 MG tablet TAKE 1 TABLET BY MOUTH EVERY DAY 90 tablet 0  . tiZANidine (ZANAFLEX) 4 MG tablet Take 1 tablet (4 mg total) by mouth every 8 (eight) hours as needed  for muscle spasms. 30 tablet 0  . topiramate (TOPAMAX) 100 MG tablet Take 100 mg by mouth at bedtime.    . traMADol (ULTRAM) 50 MG tablet TAKE 1 TABLET (50 MG TOTAL) BY MOUTH EVERY 6 (SIX) HOURS AS NEEDED. 30 tablet 0   No facility-administered medications prior to visit.    PAST MEDICAL HISTORY: Past Medical History:  Diagnosis Date  . Allergy   . Arthritis    back   . Atypical chest pain 06/30/2016  . Back pain    DUE TO INJURY AT WORK ON05/2013  . Bruises easily   . Chronic lower back pain   . Coronary artery disease   . Depression    was on meds 2 yrs ago   . Headache(784.0)    rarely  . History of bronchitis    last time about 60yr ago   . Hypertension    takes Benicar daily  . Hypothyroidism 06/30/2016  . Pure hypercholesterolemia 05/31/2019  . Vitamin D deficiency    takes Vitamin D 2 times/wk  . Weakness    and numbness right foot    PAST SURGICAL HISTORY: Past Surgical History:  Procedure Laterality Date  . BACK SURGERY    . CARPAL TUNNEL RELEASE Right   . CESAREAN SECTION  1988  . COLONOSCOPY    . EXPLORATORY LAPAROTOMY  1991   "took out fatty tumor" (11/09/2012)  . LEFT HEART CATH AND CORONARY ANGIOGRAPHY N/A 08/06/2016   Procedure: Left Heart Cath and Coronary Angiography;  Surgeon: SBelva Crome MD;  [facility];  Service: Cardiovascular;  Laterality: N/A;  . LEFT HEART CATH AND CORONARY ANGIOGRAPHY N/A 07/12/2018   Procedure: LEFT HEART CATH AND CORONARY ANGIOGRAPHY;  Surgeon: HLeonie Man MD;  [facility];  Service: Cardiovascular;  Laterality: N/A;  . LUMBAR LAMINECTOMY/DECOMPRESSION MICRODISCECTOMY  11/09/2012   "L3-4" (11/09/2012)  . LUMBAR LAMINECTOMY/DECOMPRESSION MICRODISCECTOMY N/A 11/09/2012   Procedure: L3-L4 DECOMPRESSION AND MICRODISCECTOMY   (1 LEVEL);  Surgeon: DMelina Schools MD;  [facility]  Service: Orthopedics;  Laterality: N/A;    FAMILY HISTORY: Family History  Problem Relation Age of  Onset  . Heart disease Mother   . CAD Mother   . Stroke Mother   . Heart disease Sister   . Heart attack Sister   . Liver disease Brother   . Other Father        unknown medical history  . CAD Brother   . Colon cancer Neg Hx   . Esophageal cancer Neg Hx   . Rectal cancer Neg Hx   . Stomach cancer Neg Hx     SOCIAL HISTORY: Social History   Socioeconomic History  . Marital status: Divorced    Spouse name: Not on file  . Number of children: 1  . Years of education: Not on file  . Highest education level: Some college, no degree  Occupational History  . Occupation: DSports administrator Tobacco Use  . Smoking status:  Never Smoker  . Smokeless tobacco: Never Used  Vaping Use  . Vaping Use: Never used  Substance and Sexual Activity  . Alcohol use: Yes    Alcohol/week: 7.0 standard drinks    Types: 7 Glasses of wine per week    Comment: social  . Drug use: No  . Sexual activity: Yes    Birth control/protection: Post-menopausal  Other Topics Concern  . Not on file  Social History Narrative   Right-handed.   Occasional caffeine.   Lives alone.   Social Determinants of Health   Financial Resource Strain:   . Difficulty of Paying Living Expenses:   Food Insecurity:   . Worried About Charity fundraiser in the Last Year:   . Arboriculturist in the Last Year:   Transportation Needs:   . Film/video editor (Medical):   Marland Kitchen Lack of Transportation (Non-Medical):   Physical Activity:   . Days of Exercise per Week:   . Minutes of Exercise per Session:   Stress:   . Feeling of Stress :   Social Connections:   . Frequency of Communication with Friends and Family:   . Frequency of Social Gatherings with Friends and Family:   . Attends Religious Services:   . Active Member of Clubs or Organizations:   . Attends Archivist Meetings:   Marland Kitchen Marital Status:   Intimate Partner Violence:   . Fear of Current or Ex-Partner:   . Emotionally Abused:   Marland Kitchen  Physically Abused:   . Sexually Abused:     PHYSICAL EXAM  Vitals:   06/21/19 1457  BP: (!) 145/91  Pulse: 73  Weight: 196 lb 8 oz (89.1 kg)  Height: 5' 1"  (1.549 m)   Body mass index is 37.13 kg/m.  Generalized: Well developed, in no acute distress   Neurological examination  Mentation: Alert oriented to time, place, history taking. Follows all commands speech and language fluent Cranial nerve II-XII: Pupils were equal round reactive to light. Extraocular movements were full, visual field were full on confrontational test. Facial sensation and strength were normal.  Head turning and shoulder shrug were normal and symmetric. Motor: The motor testing reveals 5 over 5 strength of all 4 extremities. Good symmetric motor tone is noted throughout.  Sensory: Sensory testing is intact to soft touch on all 4 extremities. No evidence of extinction is noted.  Coordination: Cerebellar testing reveals good finger-nose-finger and heel-to-shin bilaterally.  Gait and station: Need push-up to get up from seated position, limp due to left hip pain Reflexes: Deep tendon reflexes are symmetric and normal bilaterally.   DIAGNOSTIC DATA (LABS, IMAGING, TESTING) - I reviewed patient records, labs, notes, testing and imaging myself where available.  Lab Results  Component Value Date   WBC 8.0 09/14/2018   HGB 11.7 09/14/2018   HCT 36.5 09/14/2018   MCV 89 09/14/2018   PLT 343 09/14/2018      Component Value Date/Time   NA 137 04/04/2019 0820   K 4.4 04/04/2019 0820   CL 98 04/04/2019 0820   CO2 25 04/04/2019 0820   GLUCOSE 103 (H) 04/04/2019 0820   GLUCOSE 94 08/18/2018 1339   BUN 17 04/04/2019 0820   CREATININE 0.93 04/04/2019 0820   CALCIUM 10.0 04/04/2019 0820   PROT 7.4 04/04/2019 0820   ALBUMIN 4.5 04/04/2019 0820   AST 18 04/04/2019 0820   ALT 23 04/04/2019 0820   ALKPHOS 94 04/04/2019 0820   BILITOT 0.7 04/04/2019 0820  GFRNONAA 67 04/04/2019 0820   GFRAA 78 04/04/2019 0820     Lab Results  Component Value Date   CHOL 140 04/04/2019   HDL 46 04/04/2019   LDLCALC 80 04/04/2019   TRIG 67 04/04/2019   CHOLHDL 3.0 04/04/2019   Lab Results  Component Value Date   HGBA1C 6.3 (H) 03/14/2019   No results found for: VITAMINB12 Lab Results  Component Value Date   TSH 2.000 03/14/2018      ASSESSMENT AND PLAN 59 y.o. year old female   Bilateral carotid artery stenosis -Right-sided high-grade stenosis on MRA 80% in August 2020, 50% at left internal carotid artery Repeat ultrasound of carotid artery Keep on aspirin 81 mg plus Plavix 75 mg  Headache with migraine features Much improved, Keep Topamax 100 mg at bedtime Cambia as needed  Worsening low back pain, radiating pain to left hip, left groin area Is under the care of local orthopedic surgeon for her lumbar degenerative changes Differentiation diagnosis also include left hip pathology, X-ray of left hip Mobic 15 mg daily as needed, tramadol for more severe pain   Marcial Pacas, M.D. Ph.D.  [facility] [place], [place] [address] Phone: [phone] Fax:      [phone]

## 2019-06-21 NOTE — Telephone Encounter (Signed)
Please make sure that she is on schedule for US carotid artery

## 2019-06-22 ENCOUNTER — Ambulatory Visit
Admission: RE | Admit: 2019-06-22 | Discharge: 2019-06-22 | Disposition: A | Discharge status: Home / Self Care | Payer: 59 | Source: Ambulatory Visit | Attending: Neurology | Admitting: Neurology

## 2019-06-22 ENCOUNTER — Other Ambulatory Visit: Payer: Self-pay

## 2019-06-22 DIAGNOSIS — G43109 Migraine with aura, not intractable, without status migrainosus: Secondary | ICD-10-CM

## 2019-06-22 DIAGNOSIS — I6523 Occlusion and stenosis of bilateral carotid arteries: Secondary | ICD-10-CM

## 2019-06-22 DIAGNOSIS — R1032 Left lower quadrant pain: Secondary | ICD-10-CM

## 2019-06-22 IMAGING — CR DG HIP (WITH OR WITHOUT PELVIS) 2-3V*L*
2 series · 2 of 2 positions shown · non-contrast
Comparison: None.

CLINICAL DATA: Pain

EXAM:
DG HIP (WITH OR WITHOUT PELVIS) 2-3V LEFT

[w hip ap left]
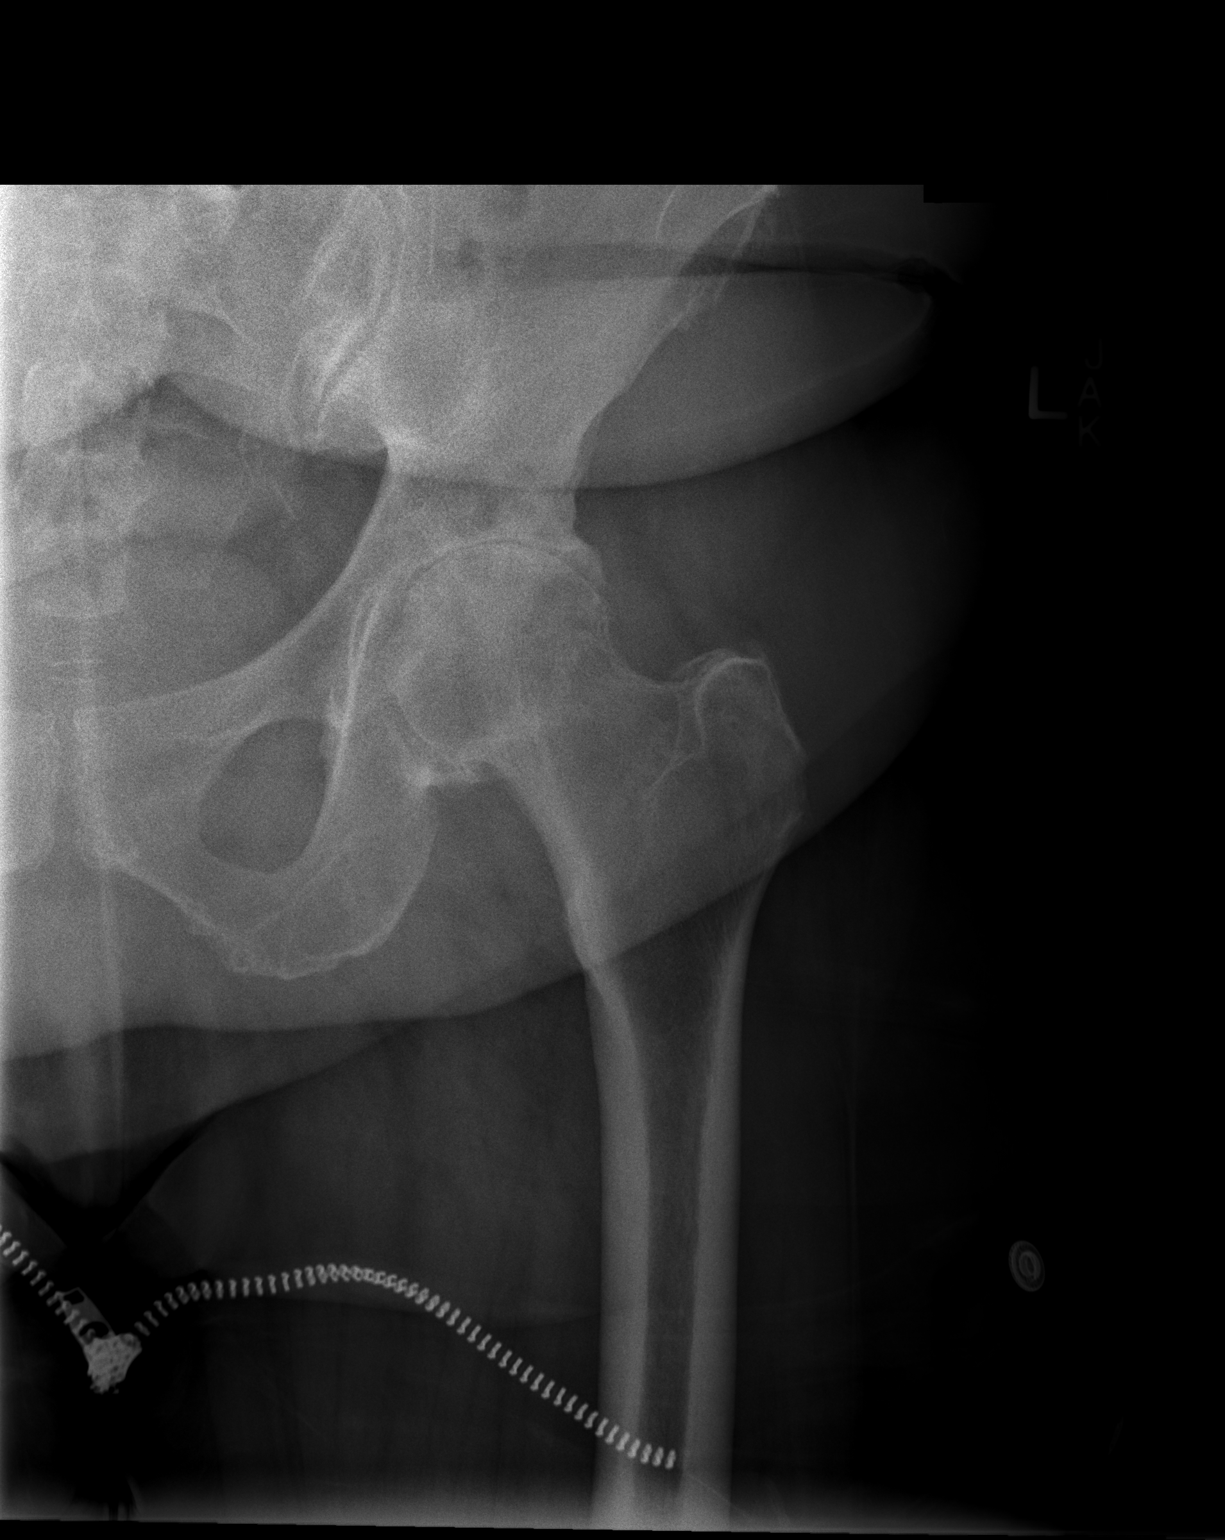

[w hip lat left]
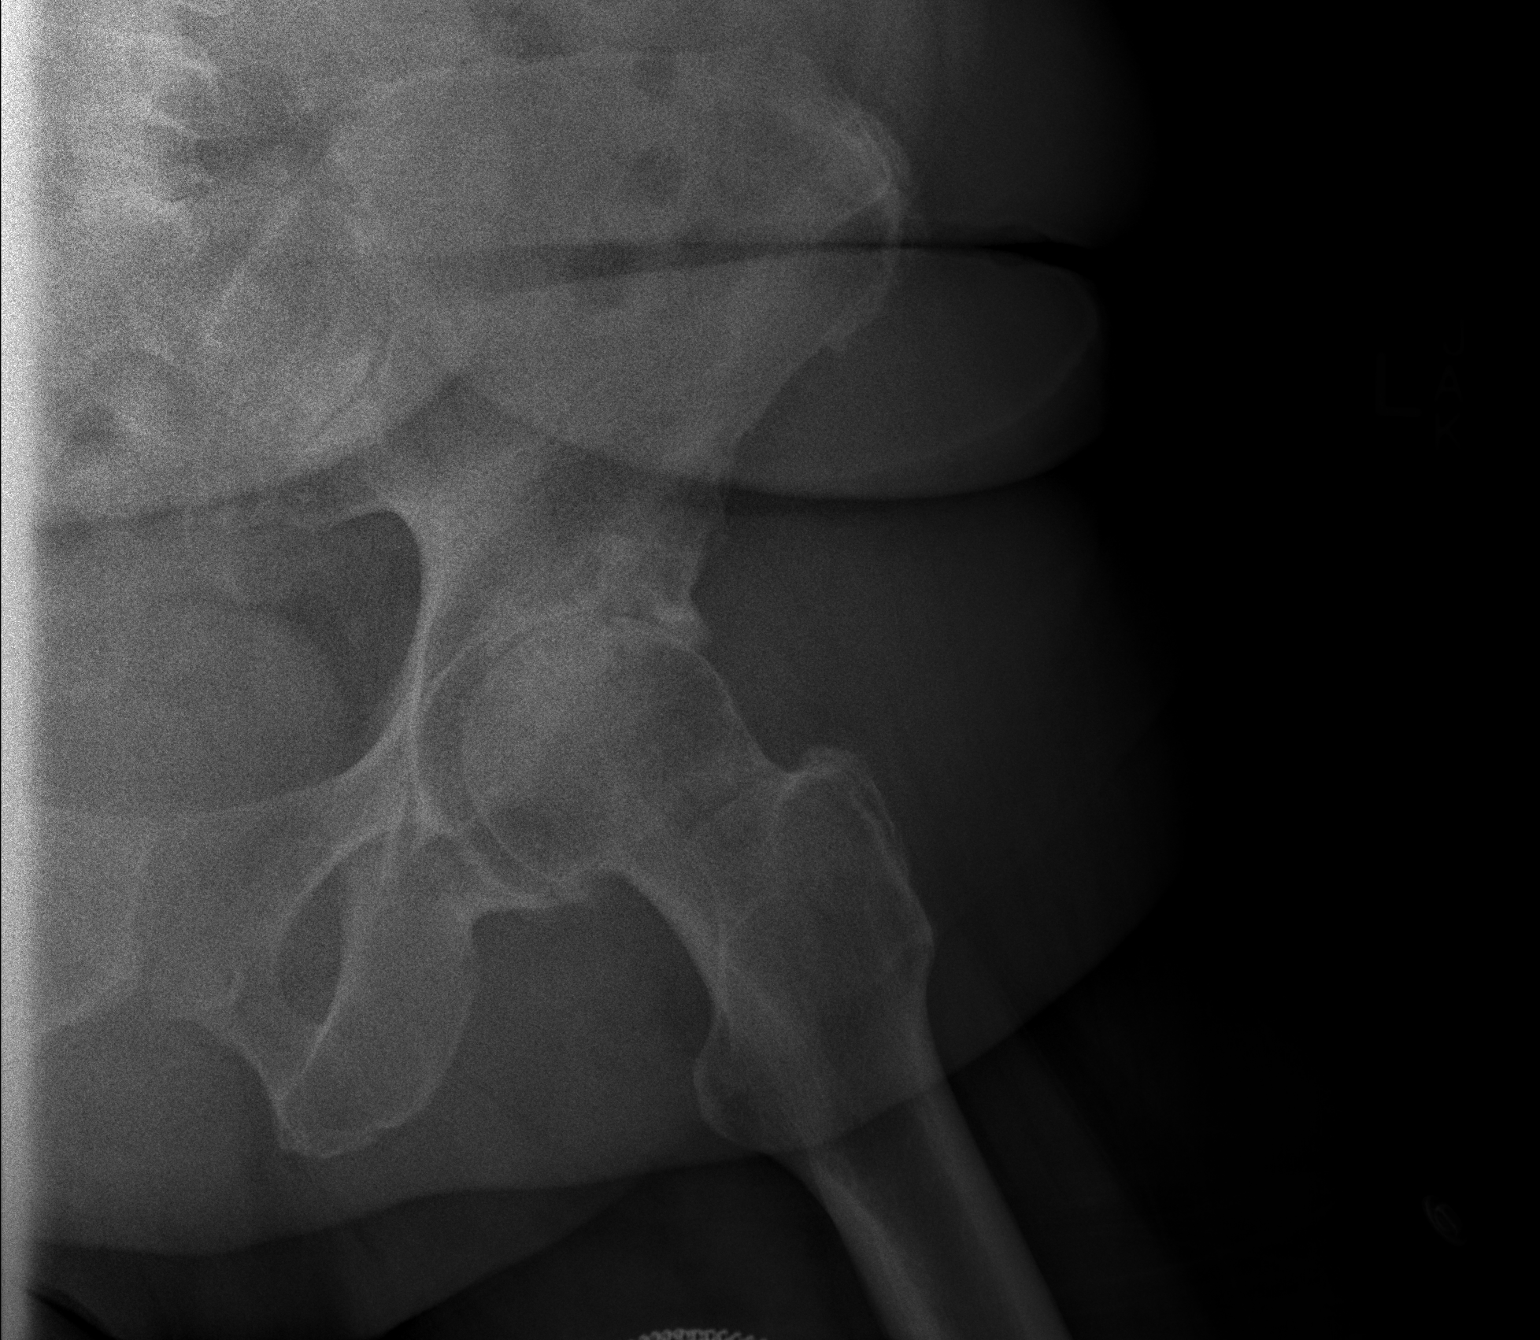

[2 of 2 positions shown; findings below may reference images not displayed]

FINDINGS: There are end-stage degenerative changes of the left hip without
evidence for an acute displaced fracture or dislocation.
IMPRESSION: End-stage degenerative changes of the left hip.

## 2019-06-22 MED ORDER — LEVOCETIRIZINE DIHYDROCHLORIDE 5 MG PO TABS: 5.0000 mg | ORAL_TABLET | Freq: Every day | ORAL | 1 refills | Status: DC

## 2019-06-22 NOTE — Telephone Encounter (Signed)
SK#[id number]. Approved through 09/19/2019.

## 2019-06-25 ENCOUNTER — Telehealth: Payer: Self-pay | Admitting: Neurology

## 2019-06-25 DIAGNOSIS — M25552 Pain in left hip: Secondary | ICD-10-CM | POA: Insufficient documentation

## 2019-06-25 NOTE — Telephone Encounter (Signed)
  CLINICAL DATA:  Pain  EXAM: DG HIP (WITH OR WITHOUT PELVIS) 2-3V LEFT  COMPARISON:  None.  FINDINGS: There are end-stage degenerative changes of the left hip without evidence for an acute displaced fracture or dislocation.  IMPRESSION: End-stage degenerative changes of the left hip.     Please call patient, x-ray of left hip showed significant degenerative changes of left hip, I have put in referral for orthopedic evaluation, ask her if she has any preference

## 2019-06-25 NOTE — Telephone Encounter (Signed)
I spoke to the patient. She is aware of the results below and agreeable to the orthopaedic referral.

## 2019-06-25 NOTE — Telephone Encounter (Signed)
I have talked to patient she is scheduled for this Wednesday for Doppler .

## 2019-06-27 ENCOUNTER — Other Ambulatory Visit: Payer: Self-pay

## 2019-06-27 ENCOUNTER — Telehealth: Payer: Self-pay | Admitting: Neurology

## 2019-06-27 ENCOUNTER — Ambulatory Visit (HOSPITAL_COMMUNITY)
Admission: RE | Admit: 2019-06-27 | Discharge: 2019-06-27 | Disposition: A | Discharge status: Home / Self Care | Payer: 59 | Source: Ambulatory Visit | Attending: Neurology | Admitting: Neurology

## 2019-06-27 DIAGNOSIS — G43109 Migraine with aura, not intractable, without status migrainosus: Secondary | ICD-10-CM | POA: Insufficient documentation

## 2019-06-27 DIAGNOSIS — R1032 Left lower quadrant pain: Secondary | ICD-10-CM | POA: Insufficient documentation

## 2019-06-27 DIAGNOSIS — I6523 Occlusion and stenosis of bilateral carotid arteries: Secondary | ICD-10-CM | POA: Insufficient documentation

## 2019-06-27 NOTE — Telephone Encounter (Addendum)
  Summary: Right Carotid: Velocities in the right ICA are consistent with a 80-99%                stenosis.  Left Carotid: Velocities in the left ICA are consistent with a 40-59% stenosis.  Vertebrals:  Bilateral vertebral arteries demonstrate antegrade flow. Subclavians: Left subclavian artery was stenotic. Normal flow hemodynamics were              seen in the right subclavian artery.  *See table(s) above for measurements and observations.   I have called patient, left message for her to call back to discuss above result, please call patient, repeat ultrasound of carotid artery continues show critical stenosis of right internal carotid artery, 80 to 99%, moderate stenosis at left carotid artery 40 to 59%  No evidence of stroke based on previous MRI,  I have discussed her case with vascular neurologist Dr. Pearlean Brownie, Recommended referring her to vascular surgeon for evaluation,   Please also check on his orthopedics referral for her severe left hip end-stage degenerative changes,

## 2019-06-29 ENCOUNTER — Other Ambulatory Visit: Payer: Self-pay | Admitting: *Deleted

## 2019-06-29 DIAGNOSIS — I6523 Occlusion and stenosis of bilateral carotid arteries: Secondary | ICD-10-CM

## 2019-06-29 DIAGNOSIS — E7800 Pure hypercholesterolemia, unspecified: Secondary | ICD-10-CM

## 2019-06-29 NOTE — Telephone Encounter (Signed)
I spoke to the patient and she verbalized understanding of the results below. She is agreeable to the vascular surgery referral.

## 2019-06-29 NOTE — Telephone Encounter (Addendum)
Left message requesting a return call on Monday. °

## 2019-07-02 NOTE — Telephone Encounter (Signed)
Marcelino Duster , I called Ortho . Ortho called patient and she has not called back to reschedule .  Aldean Baker [phone] . I called [facility] I had to change referral status . [facility] has the referral now and the nurse will review and call the patient . [phone].

## 2019-07-04 ENCOUNTER — Ambulatory Visit: Payer: Self-pay

## 2019-07-04 ENCOUNTER — Encounter: Payer: Self-pay | Admitting: Orthopaedic Surgery

## 2019-07-04 ENCOUNTER — Ambulatory Visit (INDEPENDENT_AMBULATORY_CARE_PROVIDER_SITE_OTHER): Payer: 59 | Admitting: Orthopaedic Surgery

## 2019-07-04 DIAGNOSIS — M1611 Unilateral primary osteoarthritis, right hip: Secondary | ICD-10-CM

## 2019-07-04 NOTE — Addendum Note (Signed)
Addended by: Rip Harbour on: 07/04/2019 02:30 PM   Modules accepted: Orders

## 2019-07-04 NOTE — Progress Notes (Signed)
Office Visit Note   Patient: [name]           Date of Birth: October 31, 1960           MRN: [id number] Visit Date: 07/04/2019              Requested by: Levert Feinstein, MD [address] [place],  [place] [address] PCP: Dorothyann Peng, MD   Assessment & Plan: Visit Diagnoses:  1. Primary osteoarthritis of right hip     Plan: Impression is end-stage left hip DJD.  I reviewed the x-rays with [name] in detail and does show bone-on-bone end-stage DJD.  We did discuss that hip replacement is likely the only thing that will give her any significant and prolonged relief but given the fact that she has the carotid stenosis issue as well she would like to have a consultation with vascular surgery first prior to making a decision on whether or not to have a hip replacement.  Therefore we will provide her with a cortisone injection today and hopefully she will get some good relief from this.  She will follow up with me as needed.  Follow-Up Instructions: Return if symptoms worsen or fail to improve.   Orders:  No orders of the defined types were placed in this encounter.  No orders of the defined types were placed in this encounter.     Procedures: No procedures performed   Clinical Data: No additional findings.   Subjective: Chief Complaint  Patient presents with  . Left Hip - Pain    [name] is a very pleasant 59 year old female comes in for chronic left hip and groin pain for about a year that has recently gotten worse.  She is barely able to walk without a severe limp and she has chronic severe pain in her left hip.  She has a lot of trouble and stiffness with transitioning from sit to stand.  She has groin pain that radiates down the thigh.  She has had back surgery in 2014 2019.  She currently takes Plavix for carotid stenosis and has an upcoming appointment with vascular surgery for consultation on this matter.  She is currently not working.  Takes tramadol meloxicam and  Aleve as needed.   Review of Systems  Constitutional: Negative.   HENT: Negative.   Eyes: Negative.   Respiratory: Negative.   Cardiovascular: Negative.   Endocrine: Negative.   Musculoskeletal: Negative.   Neurological: Negative.   Hematological: Negative.   Psychiatric/Behavioral: Negative.   All other systems reviewed and are negative.    Objective: Vital Signs: LMP 06/03/2011   Physical Exam Vitals and nursing note reviewed.  Constitutional:      Appearance: She is well-developed.  HENT:     Head: Normocephalic and atraumatic.  Pulmonary:     Effort: Pulmonary effort is normal.  Abdominal:     Palpations: Abdomen is soft.  Musculoskeletal:     Cervical back: Neck supple.  Skin:    General: Skin is warm.     Capillary Refill: Capillary refill takes less than 2 seconds.  Neurological:     Mental Status: She is alert and oriented to person, place, and time.  Psychiatric:        Behavior: Behavior normal.        Thought Content: Thought content normal.        Judgment: Judgment normal.     Ortho Exam Left hip shows almost no internal rotation and moderate loss of external  rotation with pain.  She has an antalgic gait.  She has severe pain with range of motion.  Positive Stinchfield sign and logroll. Specialty Comments:  No specialty comments available.  Imaging: No results found.   PMFS History: Patient Active Problem List   Diagnosis Date Noted  . Left hip pain 06/25/2019  . Severe left groin pain 06/21/2019  . Bilateral carotid artery stenosis 06/21/2019  . Pure hypercholesterolemia 05/31/2019  . CAD in native artery 04/09/2019  . Complicated migraine 72/53/6644  . Hyperglycemia 07/10/2018  . Abnormal stress test 08/06/2016  . Essential hypertension 06/30/2016  . Hypothyroidism 06/30/2016   Past Medical History:  Diagnosis Date  . Allergy   . Arthritis    back   . Atypical chest pain 06/30/2016  . Back pain    DUE TO INJURY AT WORK ON05/2013   . Bruises easily   . Chronic lower back pain   . Coronary artery disease   . Depression    was on meds 2 yrs ago   . Headache(784.0)    rarely  . History of bronchitis    last time about 27yrs ago   . Hypertension    takes Benicar daily  . Hypothyroidism 06/30/2016  . Pure hypercholesterolemia 05/31/2019  . Vitamin D deficiency    takes Vitamin D 2 times/wk  . Weakness    and numbness right foot    Family History  Problem Relation Age of Onset  . Heart disease Mother   . CAD Mother   . Stroke Mother   . Heart disease Sister   . Heart attack Sister   . Liver disease Brother   . Other Father        unknown medical history  . CAD Brother   . Colon cancer Neg Hx   . Esophageal cancer Neg Hx   . Rectal cancer Neg Hx   . Stomach cancer Neg Hx     Past Surgical History:  Procedure Laterality Date  . BACK SURGERY    . CARPAL TUNNEL RELEASE Right   . CESAREAN SECTION  1988  . COLONOSCOPY    . EXPLORATORY LAPAROTOMY  1991   "took out fatty tumor" (11/09/2012)  . LEFT HEART CATH AND CORONARY ANGIOGRAPHY N/A 08/06/2016   Procedure: Left Heart Cath and Coronary Angiography;  Surgeon: Belva Crome, MD;  Location: [facility];  Service: Cardiovascular;  Laterality: N/A;  . LEFT HEART CATH AND CORONARY ANGIOGRAPHY N/A 07/12/2018   Procedure: LEFT HEART CATH AND CORONARY ANGIOGRAPHY;  Surgeon: Leonie Man, MD;  Location: [facility];  Service: Cardiovascular;  Laterality: N/A;  . LUMBAR LAMINECTOMY/DECOMPRESSION MICRODISCECTOMY  11/09/2012   "L3-4" (11/09/2012)  . LUMBAR LAMINECTOMY/DECOMPRESSION MICRODISCECTOMY N/A 11/09/2012   Procedure: L3-L4 DECOMPRESSION AND MICRODISCECTOMY   (1 LEVEL);  Surgeon: Melina Schools, MD;  Location: [facility];  Service: Orthopedics;  Laterality: N/A;   Social History   Occupational History  . Occupation: Sports administrator  Tobacco Use  . Smoking status: Never Smoker  . Smokeless tobacco: Never Used  Vaping Use  . Vaping  Use: Never used  Substance and Sexual Activity  . Alcohol use: Yes    Alcohol/week: 7.0 standard drinks    Types: 7 Glasses of wine per week    Comment: social  . Drug use: No  . Sexual activity: Yes    Birth control/protection: Post-menopausal

## 2019-07-04 NOTE — Progress Notes (Signed)
Subjective: Patient is here for ultrasound-guided intra-articular left hip injection.   She has end-stage arthritis causing groin pain.  Objective: Pain and limited range of motion, unable to fully extend her hip.  Procedure: Ultrasound-guided left hip injection: After sterile prep with Betadine, injected 8 cc 1% lidocaine without epinephrine and 40 mg methylprednisolone using a 22-gauge spinal needle, passing the needle through the iliofemoral ligament into the femoral head/neck junction.  Injectate was seen filling the joint capsule.  Synovial fluid was clear yellow.  She had good immediate relief.

## 2019-07-10 ENCOUNTER — Telehealth: Payer: Self-pay

## 2019-07-10 NOTE — Telephone Encounter (Signed)
Voicemail, 10:44a:  Patient called to check the status of her referral to the surgeon.

## 2019-07-11 ENCOUNTER — Telehealth: Payer: Self-pay | Admitting: Orthopaedic Surgery

## 2019-07-11 NOTE — Telephone Encounter (Signed)
Patient wants to know if Dr Roda Shutters would complete a handicapp form. The one she had from her old Orthopedic doctor has expired and she is needing a renewal.

## 2019-07-11 NOTE — Telephone Encounter (Signed)
If so, for how many  months?

## 2019-07-11 NOTE — Telephone Encounter (Signed)
Called Vascular and referral is still with nurse I asked if they could please review and call patient . I called and relayed message to patient as well.

## 2019-07-11 NOTE — Telephone Encounter (Signed)
6 months

## 2019-07-13 NOTE — Telephone Encounter (Signed)
Called patient no answer LMOM.

## 2019-08-03 ENCOUNTER — Other Ambulatory Visit: Payer: Self-pay | Admitting: Internal Medicine

## 2019-08-03 ENCOUNTER — Other Ambulatory Visit: Payer: Self-pay

## 2019-08-03 DIAGNOSIS — I6523 Occlusion and stenosis of bilateral carotid arteries: Secondary | ICD-10-CM

## 2019-08-03 MED ORDER — OLMESARTAN MEDOXOMIL-HCTZ 40-25 MG PO TABS: 1.0000 | ORAL_TABLET | Freq: Every day | ORAL | 0 refills | Status: DC

## 2019-08-07 ENCOUNTER — Encounter (HOSPITAL_COMMUNITY): Payer: Self-pay

## 2019-08-07 ENCOUNTER — Inpatient Hospital Stay (HOSPITAL_COMMUNITY)
Admission: EM | Admit: 2019-08-07 | Discharge: 2019-08-10 | DRG: 064 | Disposition: A | Discharge status: Home / Self Care | Payer: 59 | Attending: Internal Medicine | Admitting: Internal Medicine

## 2019-08-07 ENCOUNTER — Emergency Department (HOSPITAL_COMMUNITY): Payer: 59

## 2019-08-07 ENCOUNTER — Other Ambulatory Visit: Payer: Self-pay

## 2019-08-07 DIAGNOSIS — Z8249 Family history of ischemic heart disease and other diseases of the circulatory system: Secondary | ICD-10-CM

## 2019-08-07 DIAGNOSIS — G43909 Migraine, unspecified, not intractable, without status migrainosus: Secondary | ICD-10-CM | POA: Diagnosis present

## 2019-08-07 DIAGNOSIS — I160 Hypertensive urgency: Secondary | ICD-10-CM | POA: Diagnosis present

## 2019-08-07 DIAGNOSIS — M549 Dorsalgia, unspecified: Secondary | ICD-10-CM | POA: Diagnosis present

## 2019-08-07 DIAGNOSIS — I609 Nontraumatic subarachnoid hemorrhage, unspecified: Secondary | ICD-10-CM | POA: Diagnosis present

## 2019-08-07 DIAGNOSIS — I63231 Cerebral infarction due to unspecified occlusion or stenosis of right carotid arteries: Secondary | ICD-10-CM | POA: Diagnosis not present

## 2019-08-07 DIAGNOSIS — E785 Hyperlipidemia, unspecified: Secondary | ICD-10-CM | POA: Diagnosis present

## 2019-08-07 DIAGNOSIS — E7800 Pure hypercholesterolemia, unspecified: Secondary | ICD-10-CM | POA: Diagnosis present

## 2019-08-07 DIAGNOSIS — I10 Essential (primary) hypertension: Secondary | ICD-10-CM | POA: Diagnosis present

## 2019-08-07 DIAGNOSIS — I6389 Other cerebral infarction: Secondary | ICD-10-CM | POA: Diagnosis not present

## 2019-08-07 DIAGNOSIS — E039 Hypothyroidism, unspecified: Secondary | ICD-10-CM | POA: Diagnosis present

## 2019-08-07 DIAGNOSIS — Z7982 Long term (current) use of aspirin: Secondary | ICD-10-CM

## 2019-08-07 DIAGNOSIS — G8194 Hemiplegia, unspecified affecting left nondominant side: Secondary | ICD-10-CM | POA: Diagnosis present

## 2019-08-07 DIAGNOSIS — G8929 Other chronic pain: Secondary | ICD-10-CM | POA: Diagnosis present

## 2019-08-07 DIAGNOSIS — D649 Anemia, unspecified: Secondary | ICD-10-CM | POA: Diagnosis present

## 2019-08-07 DIAGNOSIS — Z7902 Long term (current) use of antithrombotics/antiplatelets: Secondary | ICD-10-CM | POA: Diagnosis not present

## 2019-08-07 DIAGNOSIS — I63233 Cerebral infarction due to unspecified occlusion or stenosis of bilateral carotid arteries: Secondary | ICD-10-CM | POA: Diagnosis present

## 2019-08-07 DIAGNOSIS — M479 Spondylosis, unspecified: Secondary | ICD-10-CM | POA: Diagnosis present

## 2019-08-07 DIAGNOSIS — E559 Vitamin D deficiency, unspecified: Secondary | ICD-10-CM | POA: Diagnosis present

## 2019-08-07 DIAGNOSIS — Z20822 Contact with and (suspected) exposure to covid-19: Secondary | ICD-10-CM | POA: Diagnosis present

## 2019-08-07 DIAGNOSIS — R29810 Facial weakness: Secondary | ICD-10-CM | POA: Diagnosis present

## 2019-08-07 DIAGNOSIS — Z791 Long term (current) use of non-steroidal anti-inflammatories (NSAID): Secondary | ICD-10-CM | POA: Diagnosis not present

## 2019-08-07 DIAGNOSIS — G936 Cerebral edema: Secondary | ICD-10-CM | POA: Diagnosis present

## 2019-08-07 DIAGNOSIS — I2510 Atherosclerotic heart disease of native coronary artery without angina pectoris: Secondary | ICD-10-CM | POA: Diagnosis present

## 2019-08-07 DIAGNOSIS — Z6836 Body mass index (BMI) 36.0-36.9, adult: Secondary | ICD-10-CM | POA: Diagnosis not present

## 2019-08-07 DIAGNOSIS — I639 Cerebral infarction, unspecified: Secondary | ICD-10-CM

## 2019-08-07 DIAGNOSIS — Z823 Family history of stroke: Secondary | ICD-10-CM

## 2019-08-07 DIAGNOSIS — Z79899 Other long term (current) drug therapy: Secondary | ICD-10-CM

## 2019-08-07 DIAGNOSIS — F329 Major depressive disorder, single episode, unspecified: Secondary | ICD-10-CM | POA: Diagnosis present

## 2019-08-07 DIAGNOSIS — Z955 Presence of coronary angioplasty implant and graft: Secondary | ICD-10-CM

## 2019-08-07 LAB — CBC
HCT: 35 % — ABNORMAL LOW (ref 36.0–46.0)
Hemoglobin: 10.7 g/dL — ABNORMAL LOW (ref 12.0–15.0)
MCH: 28.7 pg (ref 26.0–34.0)
MCHC: 30.6 g/dL (ref 30.0–36.0)
MCV: 93.8 fL (ref 80.0–100.0)
Platelets: 321 10*3/uL (ref 150–400)
RBC: 3.73 MIL/uL — ABNORMAL LOW (ref 3.87–5.11)
RDW: 14 % (ref 11.5–15.5)
WBC: 9.7 10*3/uL (ref 4.0–10.5)
nRBC: 0 % (ref 0.0–0.2)

## 2019-08-07 LAB — RAPID URINE DRUG SCREEN, HOSP PERFORMED
Amphetamines: NOT DETECTED
Barbiturates: NOT DETECTED
Benzodiazepines: NOT DETECTED
Cocaine: NOT DETECTED
Opiates: NOT DETECTED
Tetrahydrocannabinol: NOT DETECTED

## 2019-08-07 LAB — COMPREHENSIVE METABOLIC PANEL
ALT: 21 U/L (ref 0–44)
AST: 21 U/L (ref 15–41)
Albumin: 3.7 g/dL (ref 3.5–5.0)
Alkaline Phosphatase: 63 U/L (ref 38–126)
Anion gap: 8 (ref 5–15)
BUN: 17 mg/dL (ref 6–20)
CO2: 24 mmol/L (ref 22–32)
Calcium: 9.2 mg/dL (ref 8.9–10.3)
Chloride: 108 mmol/L (ref 98–111)
Creatinine, Ser: 0.95 mg/dL (ref 0.44–1.00)
GFR calc Af Amer: >60 mL/min (ref >60)
GFR calc non Af Amer: >60 mL/min (ref >60)
Glucose, Bld: 117 mg/dL — ABNORMAL HIGH (ref 70–99)
Potassium: 4 mmol/L (ref 3.5–5.1)
Sodium: 140 mmol/L (ref 135–145)
Total Bilirubin: 0.7 mg/dL (ref 0.3–1.2)
Total Protein: 6.8 g/dL (ref 6.5–8.1)

## 2019-08-07 LAB — HIV ANTIBODY (ROUTINE TESTING W REFLEX): HIV Screen 4th Generation wRfx: NONREACTIVE

## 2019-08-07 LAB — URINALYSIS, ROUTINE W REFLEX MICROSCOPIC
Bacteria, UA: NONE SEEN
Bilirubin Urine: NEGATIVE
Glucose, UA: NEGATIVE mg/dL
Ketones, ur: NEGATIVE mg/dL
Nitrite: NEGATIVE
Protein, ur: NEGATIVE mg/dL
Specific Gravity, Urine: 1.005 (ref 1.005–1.030)
pH: 6 (ref 5.0–8.0)

## 2019-08-07 LAB — CBG MONITORING, ED: Glucose-Capillary: 100 mg/dL — ABNORMAL HIGH (ref 70–99)

## 2019-08-07 LAB — APTT: aPTT: 25 seconds (ref 24–36)

## 2019-08-07 LAB — PROTIME-INR
INR: 1.1 (ref 0.8–1.2)
Prothrombin Time: 13.4 seconds (ref 11.4–15.2)

## 2019-08-07 LAB — TSH: TSH: 1.871 u[IU]/mL (ref 0.350–4.500)

## 2019-08-07 LAB — MRSA PCR SCREENING: MRSA by PCR: NEGATIVE

## 2019-08-07 LAB — SARS CORONAVIRUS 2 BY RT PCR (HOSPITAL ORDER, PERFORMED IN ~~LOC~~ HOSPITAL LAB): SARS Coronavirus 2: NEGATIVE

## 2019-08-07 IMAGING — CT CT ANGIO HEAD
3 of 7 series · 10 of 36 positions shown · IV contrast (OMNI 350)
Comparison: MRA of the head neck [DATE]

CLINICAL DATA: Left-sided facial droop and weakness that resolved
but has subsequently returned.

EXAM:
CT ANGIOGRAPHY HEAD AND NECK
TECHNIQUE: Multidetector CT imaging of the head and neck was performed using
the standard protocol during bolus administration of intravenous
contrast. Multiplanar CT image reconstructions and MIPs were
obtained to evaluate the vascular anatomy. Carotid stenosis
measurements (when applicable) are obtained utilizing NASCET
criteria, using the distal internal carotid diameter as the
denominator.
CONTRAST:  75mL OMNIPAQUE IOHEXOL 350 MG/ML SOLN

[Series 1: cta neck · axial · 0.42mm/px · z∈[+1303,+1415]mm · 2 of 169 slices shown]
[im 57/169  soft-tissue]
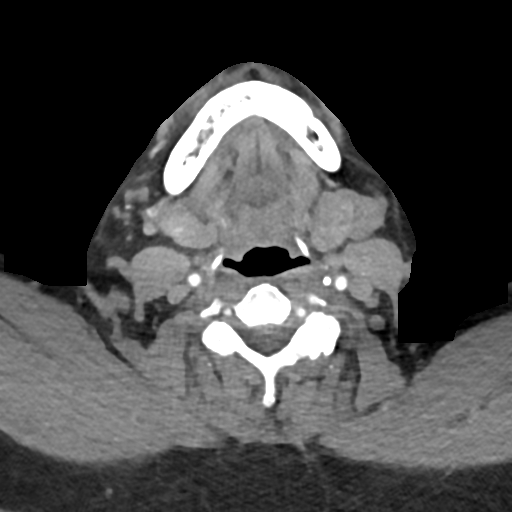
[im 113/169  soft-tissue]
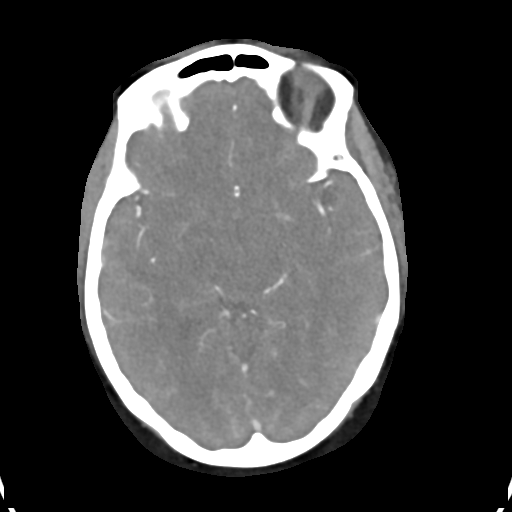

[Series 5: cta neck axial · axial · 0.39mm/px · z∈[+1205,+1436]mm · 6 of 336 slices shown]
[im 48/336  soft-tissue]
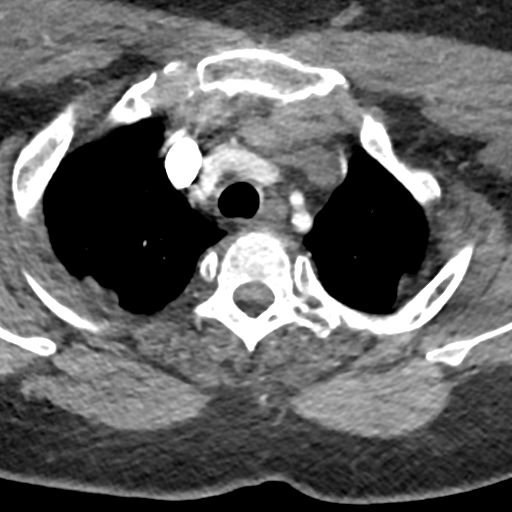
[im 96/336  bone]
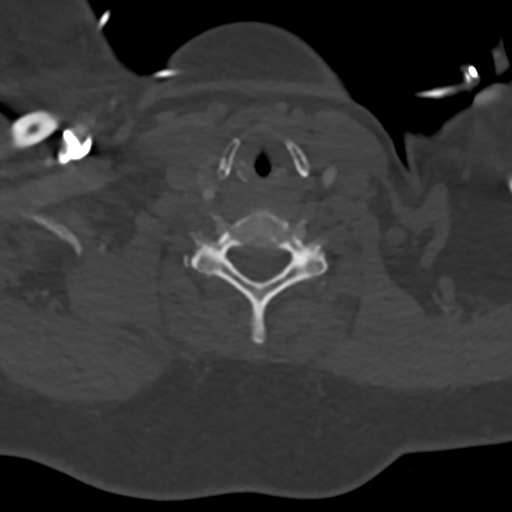
[im 144/336  soft-tissue]
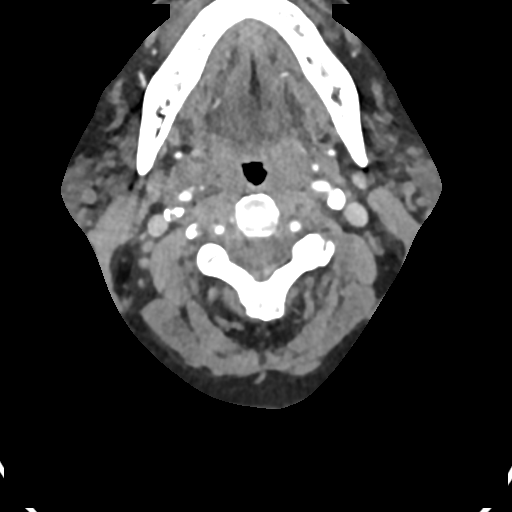
[im 192/336  bone]
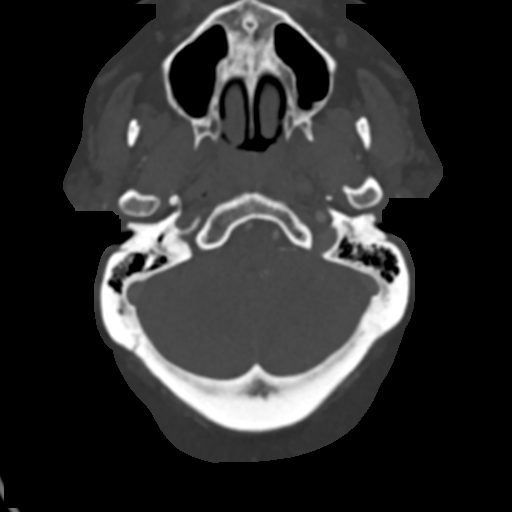
[im 240/336  soft-tissue]
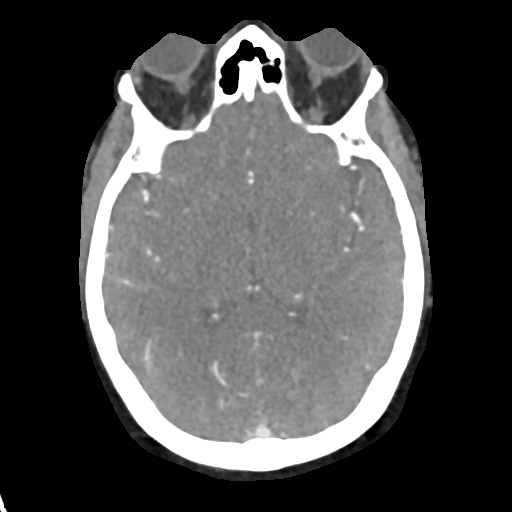
[im 288/336  bone]
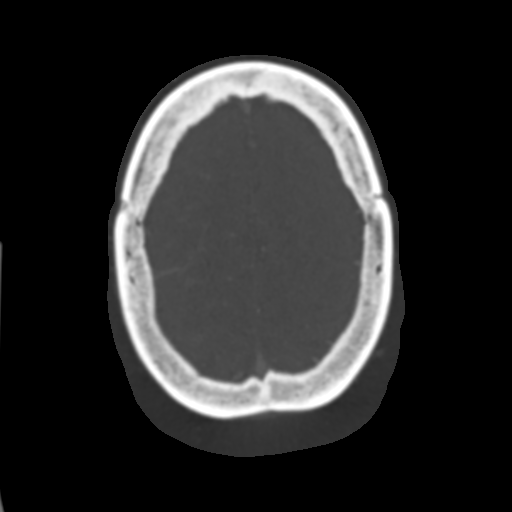

[Series 7: cta neck sagittal · sagittal · 0.44mm/px · 2 of 163 slices shown]
[im 24/163  soft-tissue]
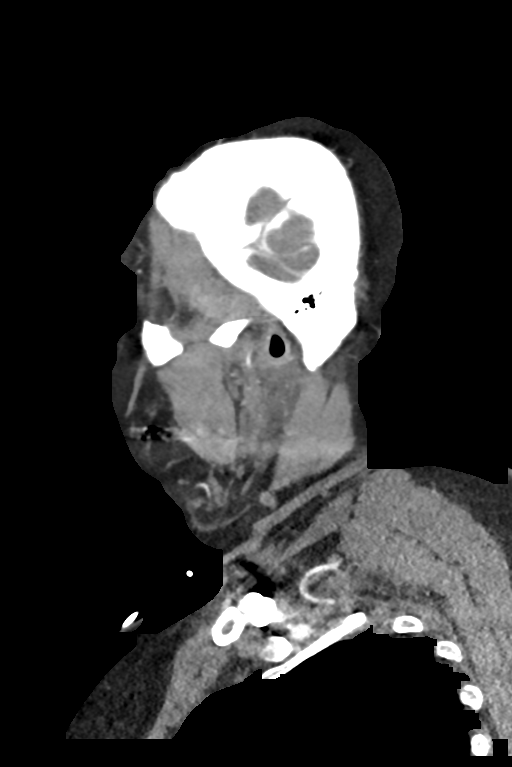
[im 140/163  soft-tissue]
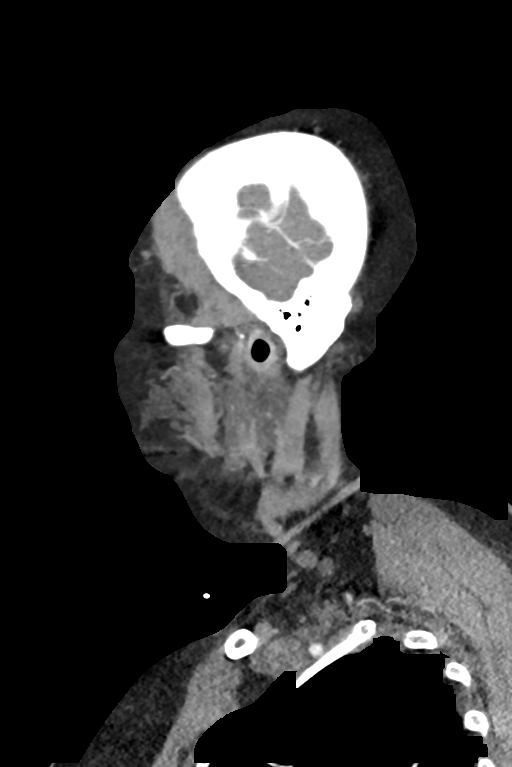

[10 of 36 positions shown; findings below may reference images not displayed]

FINDINGS: CTA NECK FINDINGS

Aortic arch: Atheromatous plaque. No acute finding. Three vessel
branching.

Right carotid system: Bifurcation of the common carotid is near the
mandibular angle. There is primarily calcified plaque at the bulb
with ICA occlusion, a progression from prior when there was critical
stenosis. The downstream vessel is likely collapsed.

Left carotid system: Mixed density mainly calcified plaque at the
proximal ICA with 75% stenosis based on source images. No ulceration
or dissection is seen.

Vertebral arteries: No proximal subclavian stenosis. The left
vertebral artery is dominant. No vertebral stenosis or beading.

Skeleton: No acute finding. Degenerative facet spurring at multiple
levels.

Other neck: No emergent finding.

Upper chest: Negative

Review of the MIP images confirms the above findings

CTA HEAD FINDINGS

Anterior circulation: Complete circle-of-Willis with hypoplastic
right A1 segment and small anterior communicating artery. The right
ICA is reconstituted at the level of the cavernous segment with even
greater opacification flow at the level of the posterior
communicating artery. No branch occlusion, beading, or aneurysm. No
visible vascular malformation.

Posterior circulation: Vertebral and basilar arteries are smooth and
widely patent. No branch occlusion, beading, or aneurysm. Mild right
P2/3 segment stenosis.

Venous sinuses: Reported on dedicated study

Anatomic variants: As above

Review of the MIP images confirms the above findings
IMPRESSION: 1. Right ICA occlusion in the neck with intracranial reconstitution.
No branch occlusion, vascular malformation, aneurysm, or visible
vasculitis to correlate with the subarachnoid hemorrhage.
2. 75% left proximal ICA stenosis.
3. No stenosis seen in the posterior circulation.

## 2019-08-07 IMAGING — CT CT VENOGRAM HEAD
1 of 7 series · 2 of 36 positions shown · IV contrast (Omni 300)
Comparison: Head CT from earlier today

CLINICAL DATA: Subarachnoid hemorrhage, evaluate for dural sinus
thrombosis.

EXAM:
CT VENOGRAM HEAD
TECHNIQUE: Venous timed CT of the head was performed after bolus administration
of iodinated contrast for CTA.
CONTRAST:  75mL OMNIPAQUE IOHEXOL 350 MG/ML SOLN

[Series 5: head with · axial · 0.42mm/px · z∈[+1430,+1482]mm · 2 of 79 slices shown]
[im 27/79  soft-tissue]
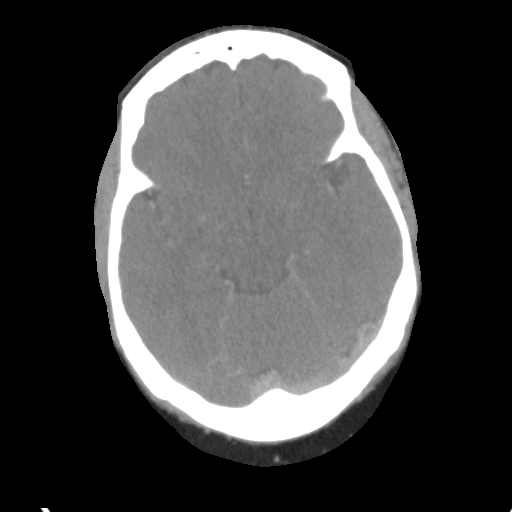
[im 53/79  bone]
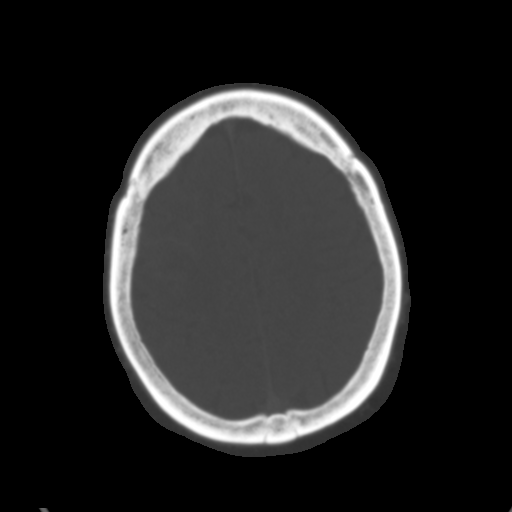

[2 of 36 positions shown; findings below may reference images not displayed]

FINDINGS: The major dural sinuses are patent. No detected tangle of vessels,
cortical vein thrombosis, or venous distension. Deep veins are
symmetrically opacified. No detected increase in the subarachnoid
hemorrhage centered at the superior right frontal lobe.
IMPRESSION: No evidence of venous thrombosis.

## 2019-08-07 IMAGING — MR MR HEAD W/O CM
6 of 10 series · 28 of 48 positions shown · non-contrast
Comparison: CT studies same day.  MRI [DATE]

CLINICAL DATA: Right frontal headache 3 days ago. Left-sided
weakness. Right ICA occlusion. Left ICA stenosis.

EXAM:
MRI HEAD WITHOUT CONTRAST
TECHNIQUE: Multiplanar, multiecho pulse sequences of the brain and surrounding
structures were obtained without intravenous contrast.

[Series 2: DWI · axial · 3.0mm · 0.94mm/px · z∈[-81,+66]mm · 9 of 100 slices shown (1 of 2)]
[im 1/100]
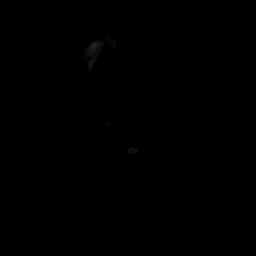
[im 13/100]
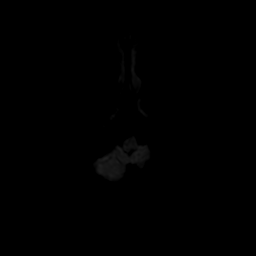
[im 25/100]
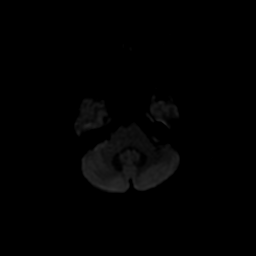
[im 38/100]
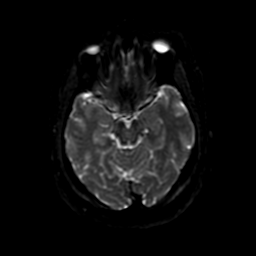
[im 50/100]
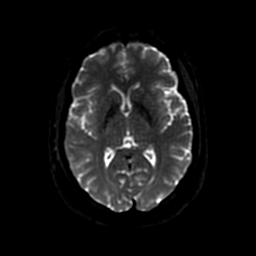
[im 62/100]
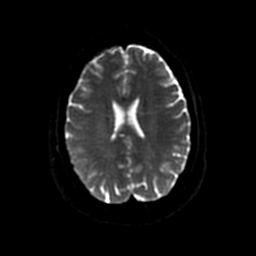
[im 75/100]
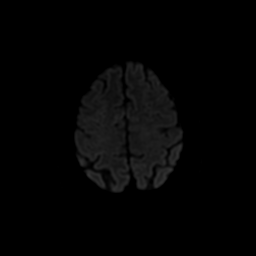
[im 87/100]
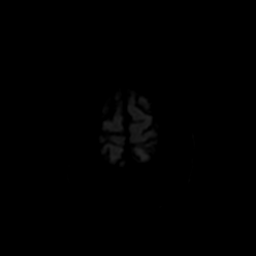
[im 100/100]
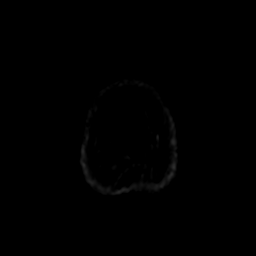

[Series 3: DWI · coronal · 4.0mm · 0.94mm/px · 7 of 74 slices shown (2 of 2)]
[im 1/74]
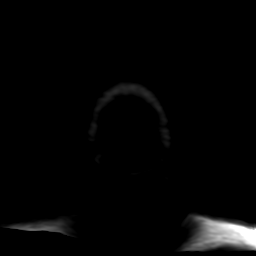
[im 13/74]
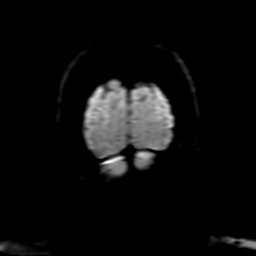
[im 25/74]
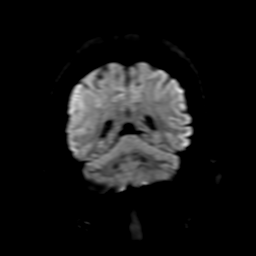
[im 37/74]
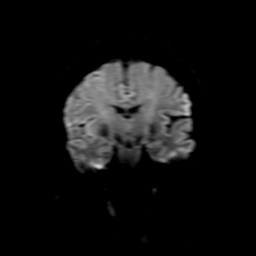
[im 49/74]
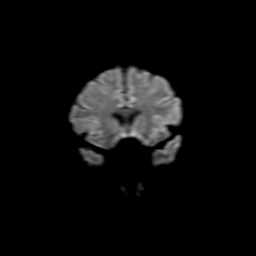
[im 61/74]
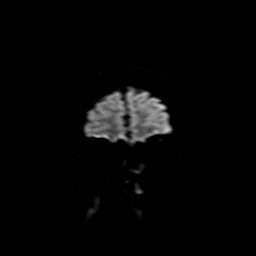
[im 74/74]
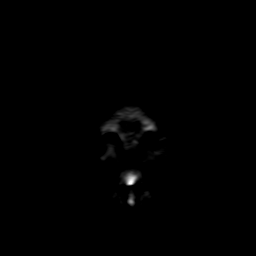

[Series 4: FLAIR · sagittal · 5.0mm · 0.23mm/px · 2 of 25 slices shown (1 of 2)]
[im 1/25]
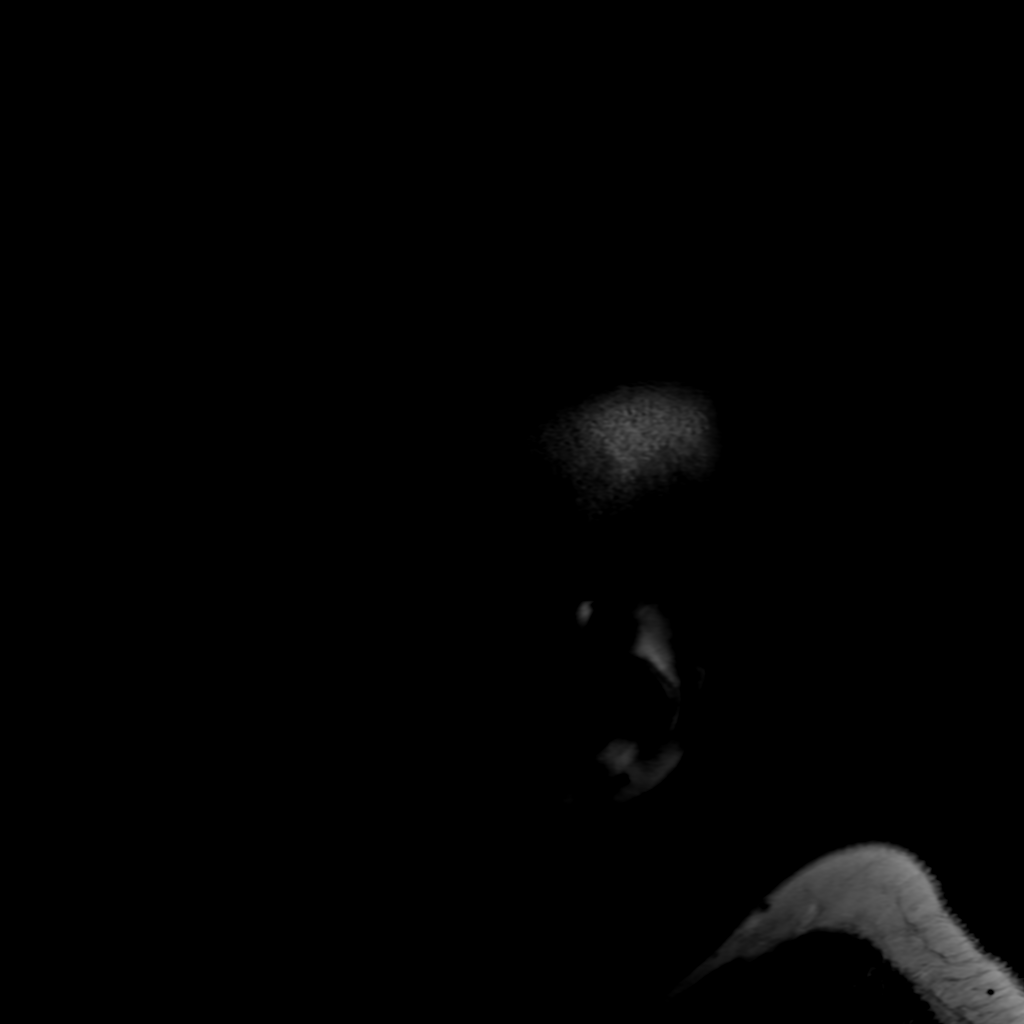
[im 25/25]
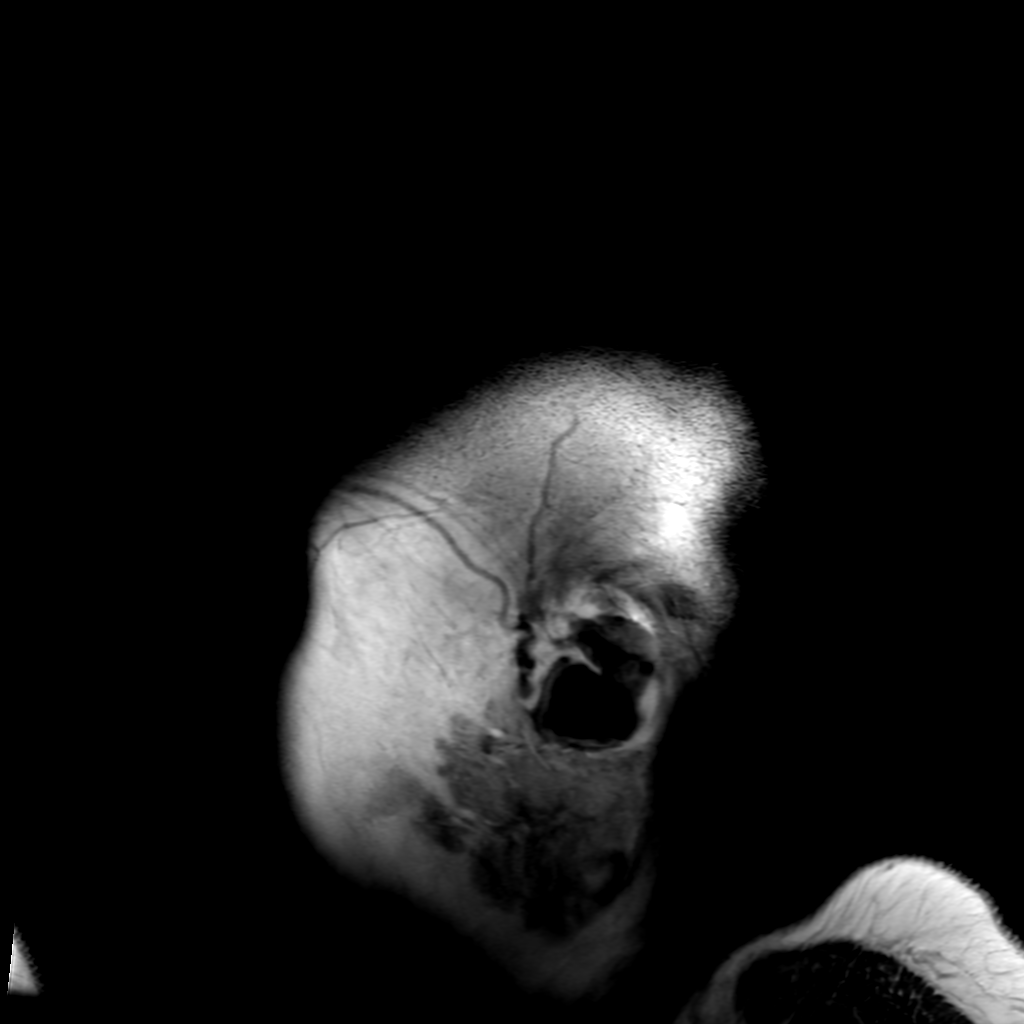

[Series 10: FLAIR · axial · 5.0mm · 0.47mm/px · z∈[-79,+70]mm · 2 of 26 slices shown (2 of 2)]
[im 1/26]
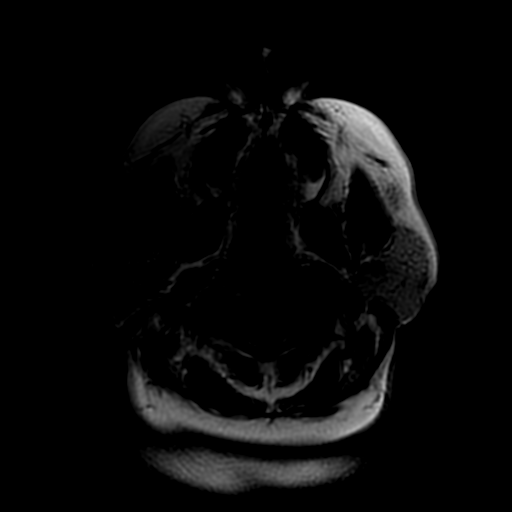
[im 26/26]
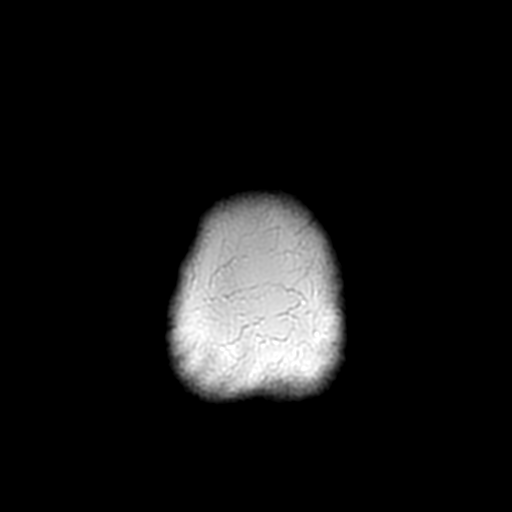

[Series 250: ADC · axial · 3.0mm · 0.94mm/px · z∈[-81,+66]mm · 5 of 50 slices shown (1 of 2)]
[im 1/50]
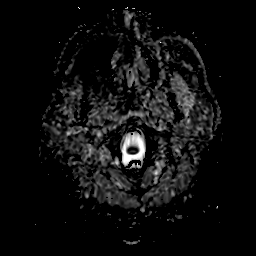
[im 13/50]
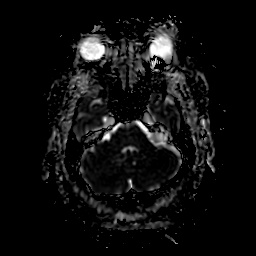
[im 25/50]
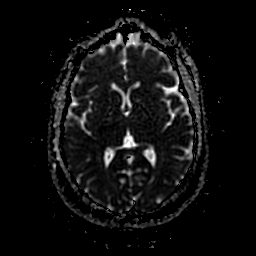
[im 37/50]
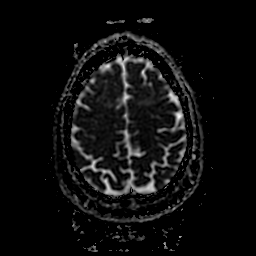
[im 50/50]
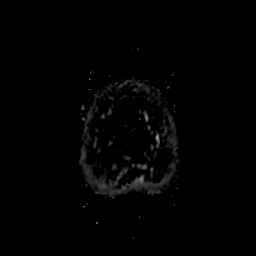

[Series 350: ADC · coronal · 4.0mm · 0.94mm/px · 3 of 37 slices shown (2 of 2)]
[im 1/37]
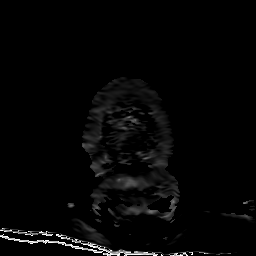
[im 19/37]
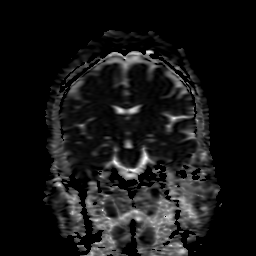
[im 37/37]
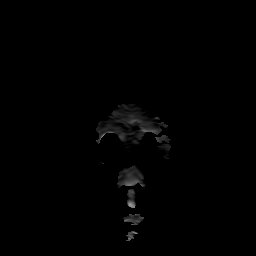

[28 of 48 positions shown; findings below may reference images not displayed]

FINDINGS: Brain: Diffusion imaging shows 2 adjacent subcentimeter foci of
acute infarction in the deep insula on the right. Involvement of the
cortex and external capsule. No other acute infarction. No mass
lesion, hemorrhage, hydrocephalus or extra-axial collection.
Subarachnoid hemorrhage is evident within the sulci of the right
frontal and parietal region. No sign of intraparenchymal hemorrhage.
Brain does not show a pattern of old ischemic changes. No
hydrocephalus. No extra-axial collection.

Vascular: Major vessels at the base of the brain show flow.

Skull and upper cervical spine: Negative

Sinuses/Orbits: Clear/normal

Other: None
IMPRESSION: Two subcentimeter foci of acute infarction in the right deep insula,
1 affecting the cortex in the other affecting the external capsule
region.

Subarachnoid hemorrhage within the sulci on the right as seen by
previous CT.

## 2019-08-07 IMAGING — CT CT HEAD W/O CM
4 series · 15 of 47 positions shown, 17 images · non-contrast
Comparison: [DATE]

CLINICAL DATA: Left-sided facial droop and weakness.  Headache.

EXAM:
CT HEAD WITHOUT CONTRAST
TECHNIQUE: Contiguous axial images were obtained from the base of the skull
through the vertex without intravenous contrast.

[Series 3: head without · axial · non-contrast · 0.41mm/px · z∈[+1448,+1568]mm · 7 of 34 slices shown, 9 images]
[im 5/34  brain]
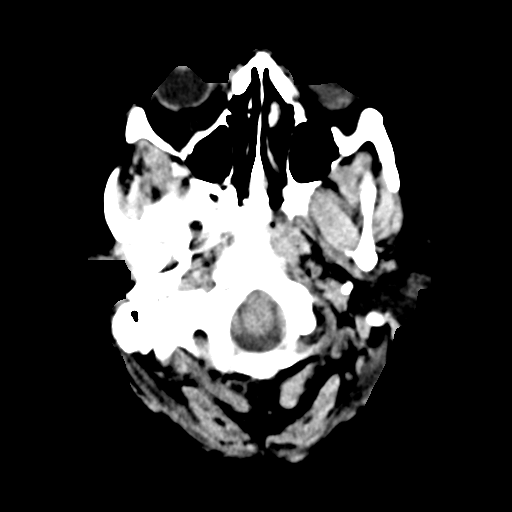
[im 5/34  bone]
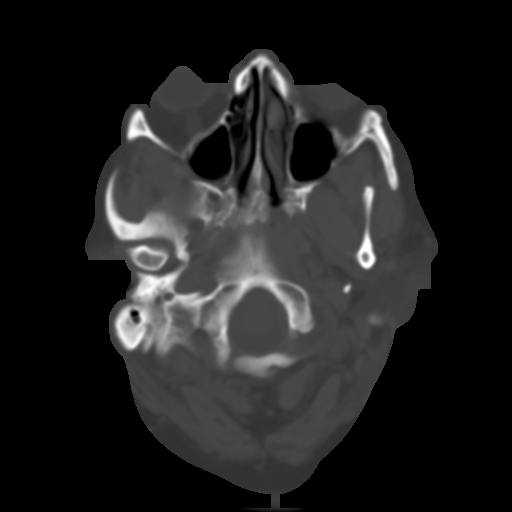
[im 9/34  brain]
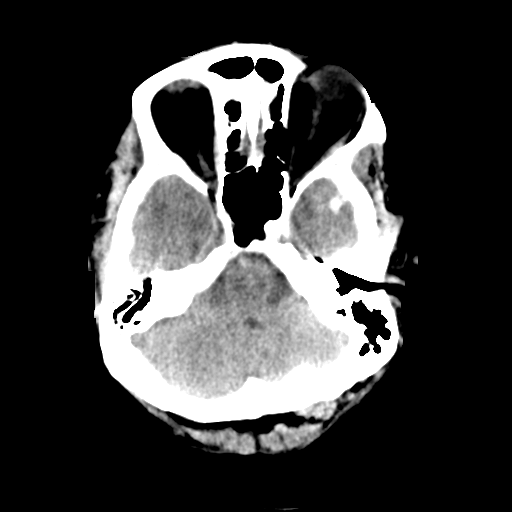
[im 13/34  brain]
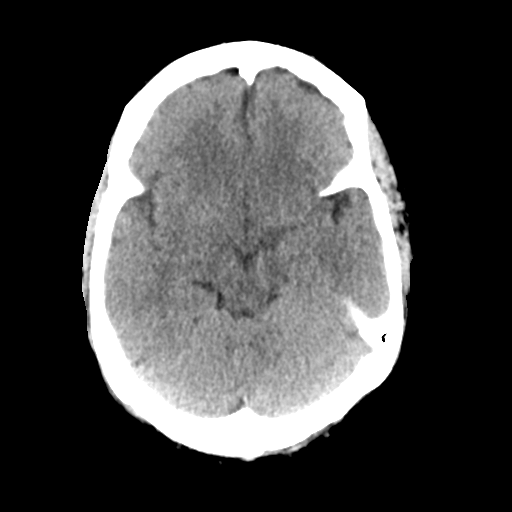
[im 17/34  brain]
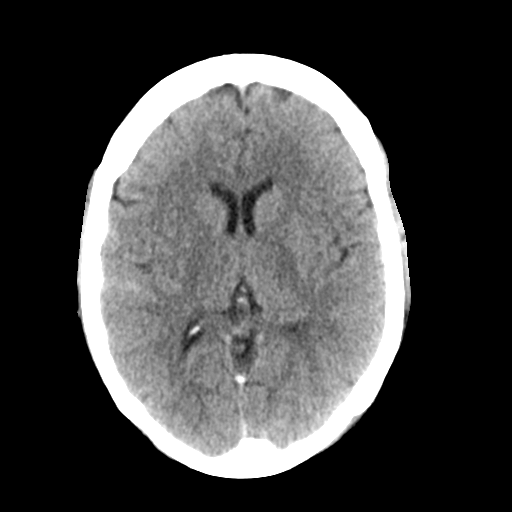
[im 21/34  brain]
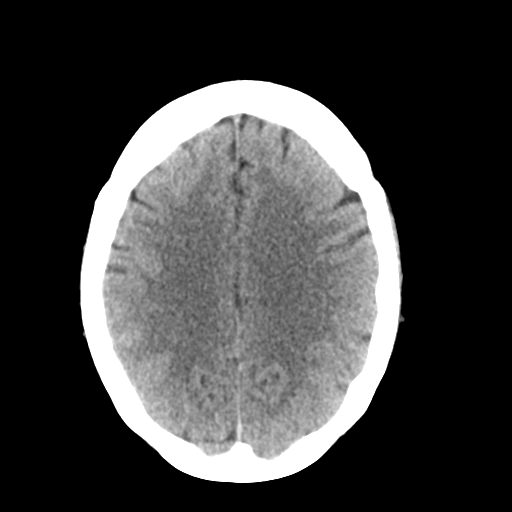
[im 21/34  bone]
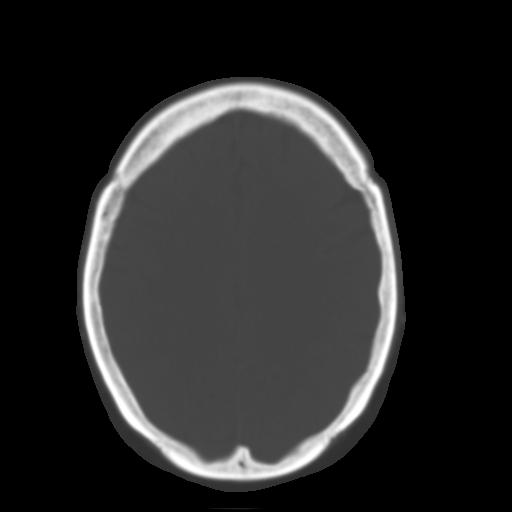
[im 25/34  brain]
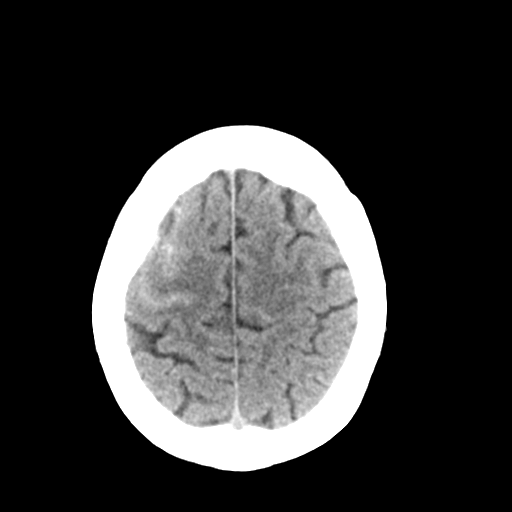
[im 29/34  brain]
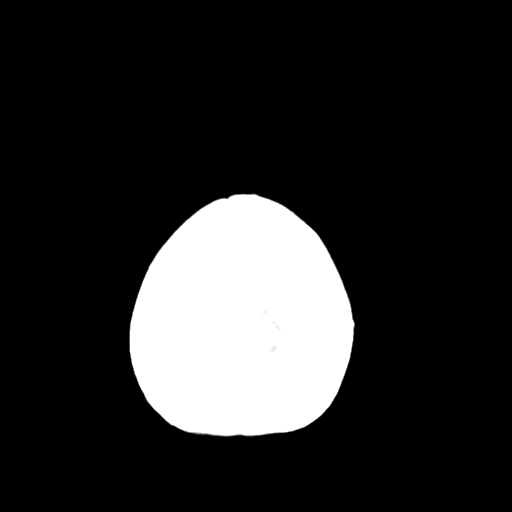

[Series 4: head bone · axial · 0.41mm/px · z∈[+1444,+1460]mm · 2 of 83 slices shown]
[im 9/83  bone]
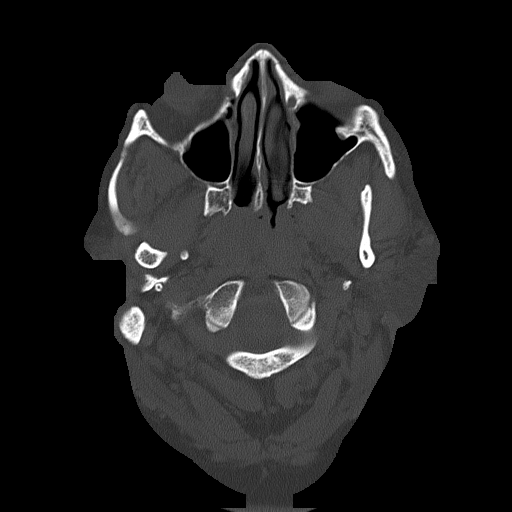
[im 17/83  bone]
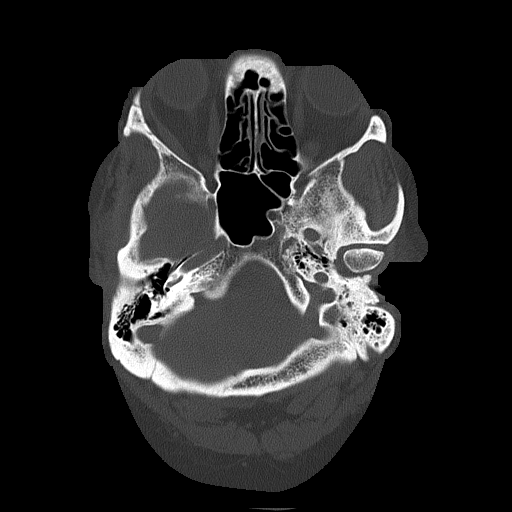

[Series 5: head without cor · coronal · non-contrast · 0.27mm/px · 3 of 65 slices shown]
[im 22/65  brain]
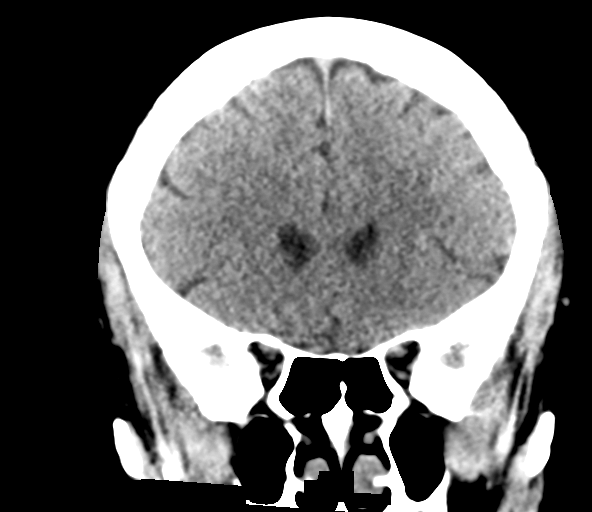
[im 29/65  brain]
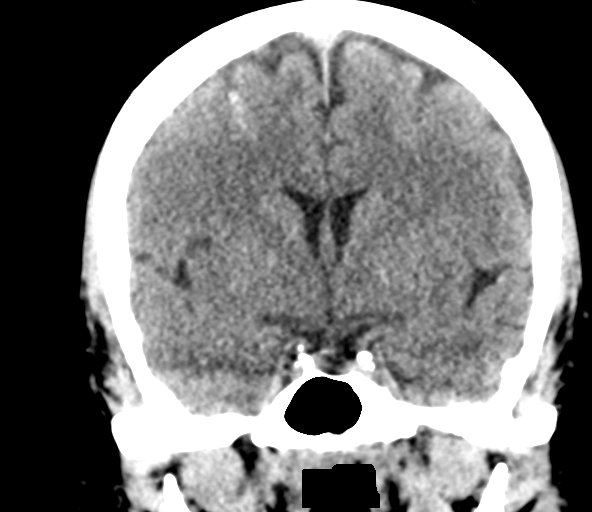
[im 36/65  brain]
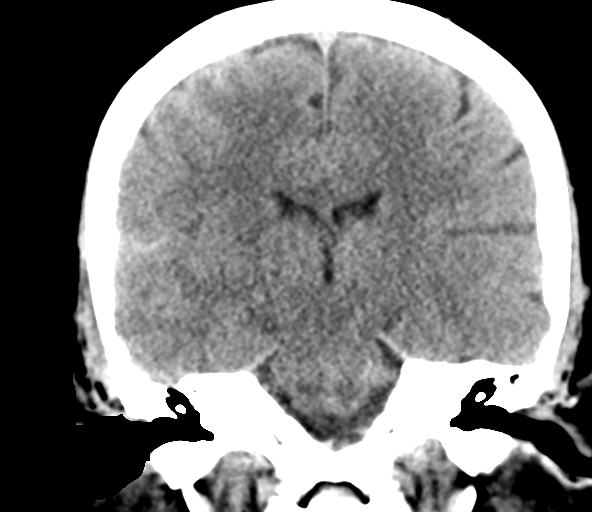

[Series 6: head without sag · sagittal · non-contrast · 0.28mm/px · 3 of 49 slices shown]
[im 17/49  brain]
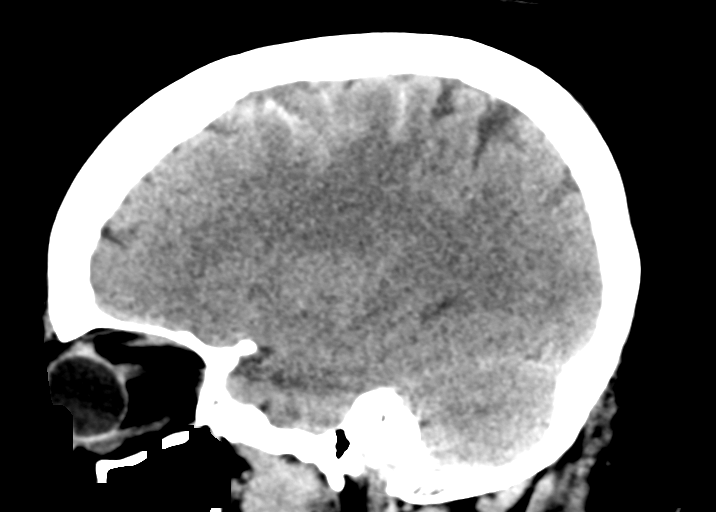
[im 25/49  brain]
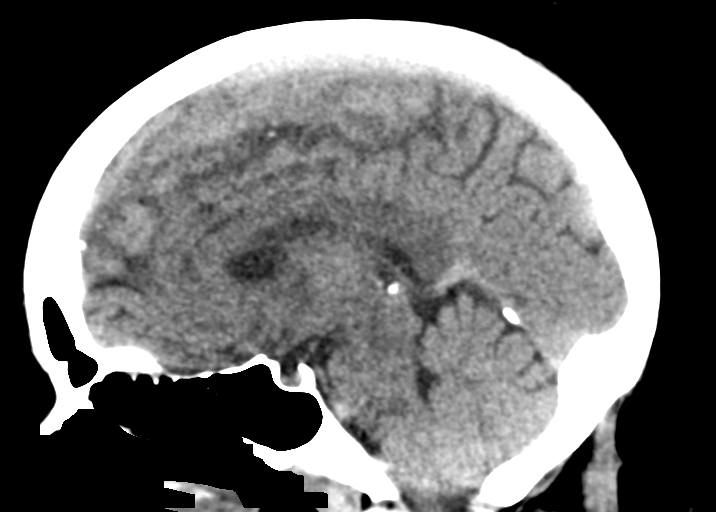
[im 33/49  brain]
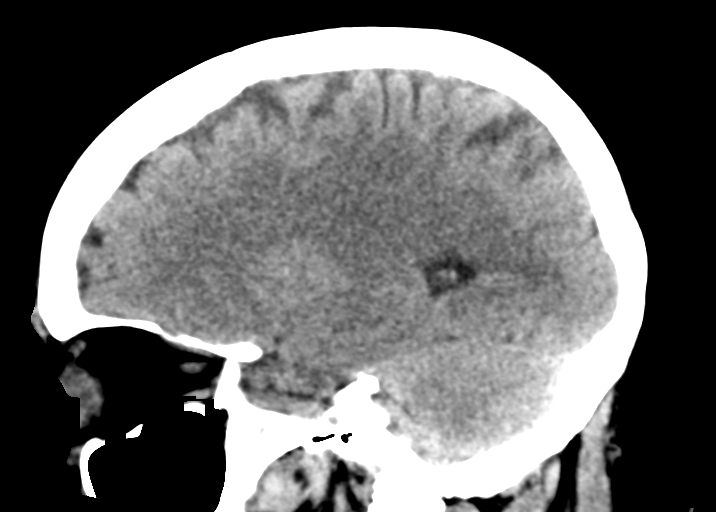

[15 of 47 positions shown; findings below may reference images not displayed]

FINDINGS: Brain: There is subarachnoid hemorrhage in the right superior
frontal lobe region. There is subtle edema in this area as well. No
hemorrhage seen elsewhere. No well-defined mass. Ventricles and
sulci appear unremarkable. Elsewhere brain parenchyma appears
unremarkable.

Vascular: There is no appreciable hyperdense vessel. There is
calcification in the carotid siphon regions bilaterally.

Skull: The bony calvarium appears intact.

Sinuses/Orbits: Visualized paranasal sinuses are clear. Orbits
appear symmetric bilaterally.

Other: Mastoid air cells are clear.
IMPRESSION: Subarachnoid hemorrhage with subtle edema in the right superior
frontal lobe anteriorly. Suspect early hemorrhagic infarct in this
area. No other findings suggesting hemorrhage elsewhere. No
extra-axial fluid collection. Elsewhere brain parenchyma appears
unremarkable.

There are foci of arterial vascular calcification.

Critical Value/emergent results were called by telephone at the time
of interpretation on [DATE] at [DATE] to provider ERGUST ,
who verbally acknowledged these results.

## 2019-08-07 MED ORDER — ASPIRIN 325 MG PO TABS
325.0000 mg | ORAL_TABLET | Freq: Once | ORAL | Status: AC
  Administered 2019-08-07: 325 mg via ORAL
  Filled 2019-08-07: qty 1

## 2019-08-07 MED ORDER — LORAZEPAM 1 MG PO TABS
1.0000 mg | ORAL_TABLET | Freq: Once | ORAL | Status: AC
  Administered 2019-08-07: 1 mg via ORAL
  Filled 2019-08-07: qty 1

## 2019-08-07 MED ORDER — IRBESARTAN 150 MG PO TABS
300.0000 mg | ORAL_TABLET | Freq: Every day | ORAL | Status: DC
  Administered 2019-08-07: 300 mg via ORAL
  Filled 2019-08-07 (×2): qty 1
  Filled 2019-08-07: qty 2

## 2019-08-07 MED ORDER — NIMODIPINE 30 MG PO CAPS
60.0000 mg | ORAL_CAPSULE | ORAL | Status: DC
  Administered 2019-08-07 – 2019-08-08 (×5): 60 mg via ORAL
  Filled 2019-08-07 (×9): qty 2

## 2019-08-07 MED ORDER — SODIUM CHLORIDE 0.9 % IV BOLUS
500.0000 mL | Freq: Once | INTRAVENOUS | Status: AC
  Administered 2019-08-07: 500 mL via INTRAVENOUS

## 2019-08-07 MED ORDER — SODIUM CHLORIDE 0.9 % IV SOLN: INTRAVENOUS | Status: DC

## 2019-08-07 MED ORDER — CLEVIDIPINE BUTYRATE 0.5 MG/ML IV EMUL
0.0000 mg/h | INTRAVENOUS | Status: DC
  Administered 2019-08-07: 1 mg/h via INTRAVENOUS
  Administered 2019-08-07: 8 mg/h via INTRAVENOUS
  Administered 2019-08-07: 16 mg/h via INTRAVENOUS
  Administered 2019-08-07: 1 mg/h via INTRAVENOUS
  Filled 2019-08-07: qty 100
  Filled 2019-08-07 (×4): qty 50

## 2019-08-07 MED ORDER — IOHEXOL 350 MG/ML SOLN
75.0000 mL | Freq: Once | INTRAVENOUS | Status: AC | PRN
  Administered 2019-08-07: 75 mL via INTRAVENOUS

## 2019-08-07 MED ORDER — HYDROCHLOROTHIAZIDE 25 MG PO TABS
25.0000 mg | ORAL_TABLET | Freq: Every day | ORAL | Status: DC
  Administered 2019-08-07: 25 mg via ORAL
  Filled 2019-08-07: qty 1

## 2019-08-07 MED ORDER — CLEVIDIPINE BUTYRATE 0.5 MG/ML IV EMUL: 0.0000 mg/h | INTRAVENOUS | Status: DC

## 2019-08-07 MED ORDER — CHLORHEXIDINE GLUCONATE CLOTH 2 % EX PADS
6.0000 | MEDICATED_PAD | Freq: Every day | CUTANEOUS | Status: DC
  Administered 2019-08-07 – 2019-08-10 (×4): 6 via TOPICAL

## 2019-08-07 MED ORDER — LORAZEPAM 1 MG PO TABS: 0.5000 mg | ORAL_TABLET | Freq: Once | ORAL | Status: DC

## 2019-08-07 MED ORDER — DOCUSATE SODIUM 100 MG PO CAPS: 100.0000 mg | ORAL_CAPSULE | Freq: Two times a day (BID) | ORAL | Status: DC | PRN

## 2019-08-07 MED ORDER — POLYETHYLENE GLYCOL 3350 17 G PO PACK: 17.0000 g | PACK | Freq: Every day | ORAL | Status: DC | PRN

## 2019-08-07 MED ORDER — CARVEDILOL 6.25 MG PO TABS
6.2500 mg | ORAL_TABLET | Freq: Two times a day (BID) | ORAL | Status: DC
  Administered 2019-08-07 – 2019-08-10 (×7): 6.25 mg via ORAL
  Filled 2019-08-07 (×2): qty 1
  Filled 2019-08-07 (×2): qty 2
  Filled 2019-08-07 (×3): qty 1

## 2019-08-07 MED ORDER — FENTANYL CITRATE (PF) 100 MCG/2ML IJ SOLN
12.5000 ug | INTRAMUSCULAR | Status: DC | PRN
  Administered 2019-08-07 – 2019-08-08 (×4): 12.5 ug via INTRAVENOUS
  Filled 2019-08-07 (×4): qty 2

## 2019-08-07 MED ORDER — ATORVASTATIN CALCIUM 80 MG PO TABS
80.0000 mg | ORAL_TABLET | Freq: Every day | ORAL | Status: DC
  Administered 2019-08-07 – 2019-08-10 (×4): 80 mg via ORAL
  Filled 2019-08-07 (×4): qty 1

## 2019-08-07 MED ORDER — ACETAMINOPHEN 325 MG PO TABS
650.0000 mg | ORAL_TABLET | Freq: Four times a day (QID) | ORAL | Status: DC | PRN
  Administered 2019-08-07: 650 mg via ORAL
  Filled 2019-08-07: qty 2

## 2019-08-07 MED ORDER — OLMESARTAN MEDOXOMIL-HCTZ 40-25 MG PO TABS: 1.0000 | ORAL_TABLET | Freq: Every day | ORAL | Status: DC

## 2019-08-07 NOTE — ED Provider Notes (Signed)
Received patient at signout from [facility].  Refer to provider note for full history and physical examination.  Briefly patient is a 59 year old female presenting for evaluation of sudden onset right-sided headache, transient left-sided facial droop and left extremity weakness.  Found to have a subarachnoid hemorrhage on CT scan.  Neurology is recommending CTA head and neck, CT venogram head, and MRI for further evaluation of the etiology of her subarachnoid hemorrhage, but do not typically admit for subarachnoid hemorrhages.  Neurosurgery has been consulted and is currently assessing the patient emergently in the ED.  Plan for admission to the hospital after imaging.     MDM  CT Angio Head W or Wo Contrast  Result Date: 08/07/2019 CLINICAL DATA:  Left-sided facial droop and weakness that resolved but has subsequently returned. EXAM: CT ANGIOGRAPHY HEAD AND NECK TECHNIQUE: Multidetector CT imaging of the head and neck was performed using the standard protocol during bolus administration of intravenous contrast. Multiplanar CT image reconstructions and MIPs were obtained to evaluate the vascular anatomy. Carotid stenosis measurements (when applicable) are obtained utilizing NASCET criteria, using the distal internal carotid diameter as the denominator. CONTRAST:  42mL OMNIPAQUE IOHEXOL 350 MG/ML SOLN COMPARISON:  MRA of the head neck 08/18/2018 FINDINGS: CTA NECK FINDINGS Aortic arch: Atheromatous plaque. No acute finding. Three vessel branching. Right carotid system: Bifurcation of the common carotid is near the mandibular angle. There is primarily calcified plaque at the bulb with ICA occlusion, a progression from prior when there was critical stenosis. The downstream vessel is likely collapsed. Left carotid system: Mixed density mainly calcified plaque at the proximal ICA with 75% stenosis based on source images. No ulceration or dissection is seen. Vertebral arteries: No proximal subclavian stenosis. The  left vertebral artery is dominant. No vertebral stenosis or beading. Skeleton: No acute finding. Degenerative facet spurring at multiple levels. Other neck: No emergent finding. Upper chest: Negative Review of the MIP images confirms the above findings CTA HEAD FINDINGS Anterior circulation: Complete circle-of-Willis with hypoplastic right A1 segment and small anterior communicating artery. The right ICA is reconstituted at the level of the cavernous segment with even greater opacification flow at the level of the posterior communicating artery. No branch occlusion, beading, or aneurysm. No visible vascular malformation. Posterior circulation: Vertebral and basilar arteries are smooth and widely patent. No branch occlusion, beading, or aneurysm. Mild right P2/3 segment stenosis. Venous sinuses: Reported on dedicated study Anatomic variants: As above Review of the MIP images confirms the above findings IMPRESSION: 1. Right ICA occlusion in the neck with intracranial reconstitution. No branch occlusion, vascular malformation, aneurysm, or visible vasculitis to correlate with the subarachnoid hemorrhage. 2. 75% left proximal ICA stenosis. 3. No stenosis seen in the posterior circulation. Electronically Signed   By: Marnee Spring M.D.   On: 08/07/2019 07:53   CT HEAD WO CONTRAST  Result Date: 08/07/2019 CLINICAL DATA:  Left-sided facial droop and weakness.  Headache. EXAM: CT HEAD WITHOUT CONTRAST TECHNIQUE: Contiguous axial images were obtained from the base of the skull through the vertex without intravenous contrast. COMPARISON:  August 18, 2018 FINDINGS: Brain: There is subarachnoid hemorrhage in the right superior frontal lobe region. There is subtle edema in this area as well. No hemorrhage seen elsewhere. No well-defined mass. Ventricles and sulci appear unremarkable. Elsewhere brain parenchyma appears unremarkable. Vascular: There is no appreciable hyperdense vessel. There is calcification in the carotid  siphon regions bilaterally. Skull: The bony calvarium appears intact. Sinuses/Orbits: Visualized paranasal sinuses are clear. Orbits appear  symmetric bilaterally. Other: Mastoid air cells are clear. IMPRESSION: Subarachnoid hemorrhage with subtle edema in the right superior frontal lobe anteriorly. Suspect early hemorrhagic infarct in this area. No other findings suggesting hemorrhage elsewhere. No extra-axial fluid collection. Elsewhere brain parenchyma appears unremarkable. There are foci of arterial vascular calcification. Critical Value/emergent results were called by telephone at the time of interpretation on 08/07/2019 at 6:03 am to provider [facility] , who verbally acknowledged these results. Electronically Signed   By: Bretta Bang III M.D.   On: 08/07/2019 06:04   CT Angio Neck W and/or Wo Contrast  Result Date: 08/07/2019 CLINICAL DATA:  Left-sided facial droop and weakness that resolved but has subsequently returned. EXAM: CT ANGIOGRAPHY HEAD AND NECK TECHNIQUE: Multidetector CT imaging of the head and neck was performed using the standard protocol during bolus administration of intravenous contrast. Multiplanar CT image reconstructions and MIPs were obtained to evaluate the vascular anatomy. Carotid stenosis measurements (when applicable) are obtained utilizing NASCET criteria, using the distal internal carotid diameter as the denominator. CONTRAST:  47mL OMNIPAQUE IOHEXOL 350 MG/ML SOLN COMPARISON:  MRA of the head neck 08/18/2018 FINDINGS: CTA NECK FINDINGS Aortic arch: Atheromatous plaque. No acute finding. Three vessel branching. Right carotid system: Bifurcation of the common carotid is near the mandibular angle. There is primarily calcified plaque at the bulb with ICA occlusion, a progression from prior when there was critical stenosis. The downstream vessel is likely collapsed. Left carotid system: Mixed density mainly calcified plaque at the proximal ICA with 75% stenosis based on source  images. No ulceration or dissection is seen. Vertebral arteries: No proximal subclavian stenosis. The left vertebral artery is dominant. No vertebral stenosis or beading. Skeleton: No acute finding. Degenerative facet spurring at multiple levels. Other neck: No emergent finding. Upper chest: Negative Review of the MIP images confirms the above findings CTA HEAD FINDINGS Anterior circulation: Complete circle-of-Willis with hypoplastic right A1 segment and small anterior communicating artery. The right ICA is reconstituted at the level of the cavernous segment with even greater opacification flow at the level of the posterior communicating artery. No branch occlusion, beading, or aneurysm. No visible vascular malformation. Posterior circulation: Vertebral and basilar arteries are smooth and widely patent. No branch occlusion, beading, or aneurysm. Mild right P2/3 segment stenosis. Venous sinuses: Reported on dedicated study Anatomic variants: As above Review of the MIP images confirms the above findings IMPRESSION: 1. Right ICA occlusion in the neck with intracranial reconstitution. No branch occlusion, vascular malformation, aneurysm, or visible vasculitis to correlate with the subarachnoid hemorrhage. 2. 75% left proximal ICA stenosis. 3. No stenosis seen in the posterior circulation. Electronically Signed   By: Marnee Spring M.D.   On: 08/07/2019 07:53   MR BRAIN WO CONTRAST  Result Date: 08/07/2019 CLINICAL DATA:  Right frontal headache 3 days ago. Left-sided weakness. Right ICA occlusion. Left ICA stenosis. EXAM: MRI HEAD WITHOUT CONTRAST TECHNIQUE: Multiplanar, multiecho pulse sequences of the brain and surrounding structures were obtained without intravenous contrast. COMPARISON:  CT studies same day.  MRI 08/18/2018 FINDINGS: Brain: Diffusion imaging shows 2 adjacent subcentimeter foci of acute infarction in the deep insula on the right. Involvement of the cortex and external capsule. No other acute  infarction. No mass lesion, hemorrhage, hydrocephalus or extra-axial collection. Subarachnoid hemorrhage is evident within the sulci of the right frontal and parietal region. No sign of intraparenchymal hemorrhage. Brain does not show a pattern of old ischemic changes. No hydrocephalus. No extra-axial collection. Vascular: Major vessels at the base  of the brain show flow. Skull and upper cervical spine: Negative Sinuses/Orbits: Clear/normal Other: None IMPRESSION: Two subcentimeter foci of acute infarction in the right deep insula, 1 affecting the cortex in the other affecting the external capsule region. Subarachnoid hemorrhage within the sulci on the right as seen by previous CT. Electronically Signed   By: Paulina Fusi M.D.   On: 08/07/2019 10:14   CT VENOGRAM HEAD  Result Date: 08/07/2019 CLINICAL DATA:  Subarachnoid hemorrhage, evaluate for dural sinus thrombosis. EXAM: CT VENOGRAM HEAD TECHNIQUE: Venous timed CT of the head was performed after bolus administration of iodinated contrast for CTA. CONTRAST:  39mL OMNIPAQUE IOHEXOL 350 MG/ML SOLN COMPARISON:  Head CT from earlier today FINDINGS: The major dural sinuses are patent. No detected tangle of vessels, cortical vein thrombosis, or venous distension. Deep veins are symmetrically opacified. No detected increase in the subarachnoid hemorrhage centered at the superior right frontal lobe. IMPRESSION: No evidence of venous thrombosis. Electronically Signed   By: Marnee Spring M.D.   On: 08/07/2019 08:16    9:00AM CONSULT: Spoke with Sarah with the critical care team.  They will evaluate the patient emergently in the ED to determine candidacy for admission to the ICU.      Jeanie Sewer, PA-C 08/07/19 1144    Shon Baton, MD 08/08/19 [phone]

## 2019-08-07 NOTE — ED Notes (Signed)
Neuro surgery at bedside.

## 2019-08-07 NOTE — ED Notes (Signed)
Pt in MRI.

## 2019-08-07 NOTE — H&P (Signed)
NAME:  [name], MRN:  [id number], DOB:  12/07/1960, LOS: 0 ADMISSION DATE:  08/07/2019, CONSULTATION DATE:  7/27 REFERRING MD:  Dr. Wilkie Aye, CHIEF COMPLAINT:  SAH   Brief History   59 year old female presented with R frontal headache found to have SAH. Admitted to ICU on clevidipine for BP control.   History of present illness   59 year old female with PMH as below, which is significant for  CAD, HTN, Hypothyroid, COVID vaccination, and migraines. Migraines are a more recent issue and has been worked up by Neurology including CT/CTA and MRI/MRA of the brain and neck about one year ago. MRI noted ICA stenosis. No structural abnormalities noted. Treated with Topamax and symptoms improved. Now 7/27 she is presenting to [facility] ED with R frontal headache x 3 days. She began to experience L sided weakness on the morning of the 27th prompting ER visit. Upon arrival to ED she underwent CT of the head, which demonstrated subarachnoid hemorrhage in the R frontal lobe with some mild edema. Notably she has received both asa and plavix. Neurosurgery was called and recommended MRI. No surgical needs at this time. She was hypertensive and started on clevidipine infusion. PCCM consulted for ICU admission.   Past Medical History   has a past medical history of Allergy, Arthritis, Atypical chest pain (06/30/2016), Back pain, Bruises easily, Chronic lower back pain, Coronary artery disease, Depression, Headache(784.0), History of bronchitis, Hypertension, Hypothyroidism (06/30/2016), Pure hypercholesterolemia (05/31/2019), Vitamin D deficiency, and Weakness.  Significant Hospital Events     Consults:  Neurosurgery  Procedures:    Significant Diagnostic Tests:  CT head 7/27 > Subarachnoid hemorrhage with subtle edema in the right superior frontal lobe anteriorly. CTA head/neck 7/27 > Right ICA occlusion in the neck with intracranial reconstitution. No branch occlusion, vascular malformation, aneurysm,  or visible vasculitis to correlate with the subarachnoid hemorrhage. 75% left proximal ICA stenosis. CT venogram 7/27 > No evidence of venous thrombosis.  MRI/A Brain/neck 7/27 >  Micro Data:    Antimicrobials:     Interim history/subjective:    Objective   Blood pressure (!) 135/74, pulse 75, temperature 98.1 F (36.7 C), temperature source Oral, resp. rate 18, height 5\' 1"  (1.549 m), weight 87.1 kg, last menstrual period 06/03/2011, SpO2 97 %.       No intake or output data in the 24 hours ending 08/07/19 0926 Filed Weights   08/07/19 0729  Weight: 87.1 kg    Examination: General: middle aged overweight female in NAD HENT: [place]/AT, PERRL, no JVD.  Lungs: Clear Cardiovascular: RRR, no MRG Abdomen: Soft, non-tender Extremities: No acute deformity. 5/5 ROM x 4 Neuro: Alert, oriented, non-focal. Cranial nerves 2-12 grossly intact at this time.   Resolved Hospital Problem list     Assessment & Plan:   Subarachnoid hemorrhage  - Hold home plavix, aspirin - Neurosurgery following, no surgical need at this time - Keep SBP < 08/09/19 - Nimodipine 60mg  q 4 - Resume home atorvastatin - Check coags  Hypertensive crisis - Clevidipine for BP goal as above - Resume home carvedilol, olmesartan-HCTZ  Hypothyroid - No home medication - Check TSH  Best practice:  Diet: Regular Pain/Anxiety/Delirium protocol (if indicated): NA VAP protocol (if indicated): NA DVT prophylaxis: SCD GI prophylaxis: NA Glucose control: NA Mobility: up with assist Code Status: FULL Family Communication: Patient updated at length in ED by myself and Dr. .  Disposition: ICU  Labs   CBC: Recent Labs  Lab 08/07/19  0433  WBC 9.7  HGB 10.7*  HCT 35.0*  MCV 93.8  PLT 321    Basic Metabolic Panel: Recent Labs  Lab 08/07/19 0433  NA 140  K 4.0  CL 108  CO2 24  GLUCOSE 117*  BUN 17  CREATININE 0.95  CALCIUM 9.2   GFR: Estimated Creatinine Clearance: 63.9 mL/min (by C-G  formula based on SCr of 0.95 mg/dL). Recent Labs  Lab 08/07/19 0433  WBC 9.7    Liver Function Tests: Recent Labs  Lab 08/07/19 0433  AST 21  ALT 21  ALKPHOS 63  BILITOT 0.7  PROT 6.8  ALBUMIN 3.7   No results for input(s): LIPASE, AMYLASE in the last 168 hours. No results for input(s): AMMONIA in the last 168 hours.  ABG    Component Value Date/Time   TCO2 29 08/18/2018 1339     Coagulation Profile: No results for input(s): INR, PROTIME in the last 168 hours.  Cardiac Enzymes: No results for input(s): CKTOTAL, CKMB, CKMBINDEX, TROPONINI in the last 168 hours.  HbA1C: Hgb A1c MFr Bld  Date/Time Value Ref Range Status  03/14/2019 12:17 PM 6.3 (H) 4.8 - 5.6 % Final    Comment:             Prediabetes: 5.7 - 6.4          Diabetes: >6.4          Glycemic control for adults with diabetes: <7.0   09/14/2018 10:39 AM 6.4 (H) 4.8 - 5.6 % Final    Comment:             Prediabetes: 5.7 - 6.4          Diabetes: >6.4          Glycemic control for adults with diabetes: <7.0     CBG: Recent Labs  Lab 08/07/19 0444  GLUCAP 100*    Review of Systems:   Bolds are positive  Constitutional: weight loss, gain, night sweats, Fevers, chills, fatigue .  HEENT: headaches, Sore throat, sneezing, nasal congestion, post nasal drip, Difficulty swallowing, Tooth/dental problems, visual complaints visual changes, ear ache CV:  chest pain, radiates:,Orthopnea, PND, swelling in lower extremities, dizziness, palpitations, syncope.  GI  heartburn, indigestion, abdominal pain, nausea, vomiting, diarrhea, change in bowel habits, loss of appetite, bloody stools.  Resp: cough, productive:, hemoptysis, dyspnea, chest pain, pleuritic.  Skin: rash or itching or icterus GU: dysuria, change in color of urine, urgency or frequency. flank pain, hematuria  MS: joint pain or swelling. decreased range of motion  Psych: change in mood or affect. depression or anxiety.  Neuro: difficulty with  speech, weakness, numbness, ataxia    Past Medical History  She,  has a past medical history of Allergy, Arthritis, Atypical chest pain (06/30/2016), Back pain, Bruises easily, Chronic lower back pain, Coronary artery disease, Depression, Headache(784.0), History of bronchitis, Hypertension, Hypothyroidism (06/30/2016), Pure hypercholesterolemia (05/31/2019), Vitamin D deficiency, and Weakness.   Surgical History    Past Surgical History:  Procedure Laterality Date  . BACK SURGERY    . CARPAL TUNNEL RELEASE Right   . CESAREAN SECTION  1988  . COLONOSCOPY    . EXPLORATORY LAPAROTOMY  1991   "took out fatty tumor" (11/09/2012)  . LEFT HEART CATH AND CORONARY ANGIOGRAPHY N/A 08/06/2016   Procedure: Left Heart Cath and Coronary Angiography;  Surgeon: Lyn RecordsSmith, Henry W, MD;  [facility];  Service: Cardiovascular;  Laterality: N/A;  . LEFT HEART CATH AND CORONARY ANGIOGRAPHY N/A  07/12/2018   Procedure: LEFT HEART CATH AND CORONARY ANGIOGRAPHY;  Surgeon: Marykay Lex, MD;  Location: [facility] INVASIVE CV LAB;  Service: Cardiovascular;  Laterality: N/A;  . LUMBAR LAMINECTOMY/DECOMPRESSION MICRODISCECTOMY  11/09/2012   "L3-4" (11/09/2012)  . LUMBAR LAMINECTOMY/DECOMPRESSION MICRODISCECTOMY N/A 11/09/2012   Procedure: L3-L4 DECOMPRESSION AND MICRODISCECTOMY   (1 LEVEL);  Surgeon: Venita Lick, MD;  Location[facility];  Service: Orthopedics;  Laterality: N/A;     Social History   reports that she has never smoked. She has never used smokeless tobacco. She reports current alcohol use of about 7.0 standard drinks of alcohol per week. She reports that she does not use drugs.   Family History   Her family history includes CAD in her brother and mother; Heart attack in her sister; Heart disease in her mother and sister; Liver disease in her brother; Other in her father; Stroke in her mother. There is no history of Colon cancer, Esophageal cancer, Rectal cancer, or Stomach cancer.   Allergies No  Known Allergies   Home Medications  Prior to Admission medications   Medication Sig Start Date End Date Taking? Authorizing Provider  acetaminophen (TYLENOL) 500 MG tablet Take 1,000 mg by mouth every 6 (six) hours as needed for headache (pain).   Yes [provider]  aspirin EC 81 MG EC tablet Take 1 tablet (81 mg total) by mouth daily. 07/14/18  Yes Vann, Jessica U, DO  atorvastatin (LIPITOR) 80 MG tablet TAKE 1 TABLET (80 MG TOTAL) BY MOUTH DAILY AT 6 PM. 01/29/19  Yes Chilton Si, MD  carvedilol (COREG) 6.25 MG tablet TAKE 1 TABLET (6.25 MG TOTAL) BY MOUTH 2 (TWO) TIMES DAILY WITH A MEAL. 12/01/18  Yes Chilton Si, MD  cholecalciferol (VITAMIN D3) 25 MCG (1000 UNIT) tablet Take 1,000 Units by mouth daily.   Yes [provider]  clopidogrel (PLAVIX) 75 MG tablet TAKE 1 TABLET (75 MG TOTAL) BY MOUTH DAILY WITH BREAKFAST. 01/29/19  Yes Chilton Si, MD  fluticasone [facility]) 50 MCG/ACT nasal spray Place 2 sprays into both nostrils daily as needed for allergies or rhinitis.   Yes [provider]  levocetirizine (XYZAL) 5 MG tablet Take 1 tablet (5 mg total) by mouth daily. 06/22/19  Yes Dorothyann Peng, MD  Magnesium 250 MG TABS TAKE 1 TABLET BY MOUTH DAILY WITH EVENING MEALS Patient taking differently: Take 250 mg by mouth daily as needed (low magnesium).  03/07/19  Yes Dorothyann Peng, MD  nitroGLYCERIN (NITROSTAT) 0.4 MG SL tablet Place 1 tablet (0.4 mg total) under the tongue every 5 (five) minutes as needed for chest pain. 07/13/18  Yes Vann, Jessica U, DO  olmesartan-hydrochlorothiazide (BENICAR HCT) 40-25 MG tablet Take 1 tablet by mouth daily. 08/03/19  Yes Dorothyann Peng, MD  topiramate (TOPAMAX) 100 MG tablet Take 1 tablet (100 mg total) by mouth at bedtime. 06/21/19  Yes Levert Feinstein, MD  traMADol (ULTRAM) 50 MG tablet Take 1 tablet (50 mg total) by mouth every 12 (twelve) hours as needed. Patient taking differently: Take 50 mg by mouth every 12 (twelve)  hours as needed for moderate pain.  06/21/19  Yes Levert Feinstein, MD  Diclofenac Potassium,Migraine, (CAMBIA) 50 MG PACK Take 50 mg by mouth as needed. Patient not taking: Reported on 08/07/2019 12/21/18   Glean Salvo, NP  ezetimibe (ZETIA) 10 MG tablet Take 1 tablet (10 mg total) by mouth daily. 04/19/19 07/18/19  Chilton Si, MD  meloxicam (MOBIC) 15 MG tablet Take 1 tablet (15 mg total) by  mouth daily as needed for pain. Patient not taking: Reported on 08/07/2019 06/21/19   Levert Feinstein, MD  tiZANidine (ZANAFLEX) 4 MG tablet Take 1 tablet (4 mg total) by mouth every 8 (eight) hours as needed for muscle spasms. Patient not taking: Reported on 08/07/2019 03/10/19   Domenick Gong, MD  traMADol (ULTRAM) 50 MG tablet TAKE 1 TABLET (50 MG TOTAL) BY MOUTH EVERY 6 (SIX) HOURS AS NEEDED. Patient not taking: Reported on 08/07/2019 06/07/19   Dorothyann Peng, MD     Critical care time: 40 minutes      Joneen Roach, AGACNP-BC [facility] Pulmonary/Critical Care  See [facility] for personal pager PCCM on call pager [phone]  08/07/2019 9:59 AM

## 2019-08-07 NOTE — ED Provider Notes (Signed)
[facility] EMERGENCY DEPARTMENT Provider Note   CSN: [id number] Arrival date & time:        History Chief Complaint  Patient presents with  . Transient Ischemic Attack    [name] is a 59 y.o. female.  The history is provided by the patient and medical records. No language interpreter was used.     59 year old female with history of CAD, chronic back pain, depression, hypertension, hypercholesterolemia, complicated migraine brought here via EMS for concern of potential stroke.  Patient report yesterday afternoon she experienced intermittent tingling sensation to her left foot lasting for approximately 10 minutes and resolved without any specific treatment.  Last night she went to sleep at approximately 10 PM and felt normal.  She was awoke approximately 2 hours ago with complaints of heart racing and was having some right-sided headache which she describes pressure behind her right eye.  She went to use the bathroom and while looking into the mirror she noticed left-sided facial droop as well as having left arm weakness lasting for approximately 5 minutes and when EMS arrived, symptoms resolved.  She is currently symptom-free aside from mild headache.  She does take baby aspirin daily.  She is currently on Plavix.  She has had her Covid vaccination. Pt denies fever, chills, neck stiffness, cp, sob, abd pain, dysuria, confusion, n/v/d. Denies tobacco or drug use.  She drinks alcohol on occasion.  She report having hx of   Past Medical History:  Diagnosis Date  . Allergy   . Arthritis    back   . Atypical chest pain 06/30/2016  . Back pain    DUE TO INJURY AT WORK ON05/2013  . Bruises easily   . Chronic lower back pain   . Coronary artery disease   . Depression    was on meds 2 yrs ago   . Headache(784.0)    rarely  . History of bronchitis    last time about 13yrs ago   . Hypertension    takes Benicar daily  . Hypothyroidism 06/30/2016  . Pure  hypercholesterolemia 05/31/2019  . Vitamin D deficiency    takes Vitamin D 2 times/wk  . Weakness    and numbness right foot    Patient Active Problem List   Diagnosis Date Noted  . Left hip pain 06/25/2019  . Severe left groin pain 06/21/2019  . Bilateral carotid artery stenosis 06/21/2019  . Pure hypercholesterolemia 05/31/2019  . CAD in native artery 04/09/2019  . Complicated migraine 08/23/2018  . Hyperglycemia 07/10/2018  . Abnormal stress test 08/06/2016  . Essential hypertension 06/30/2016  . Hypothyroidism 06/30/2016    Past Surgical History:  Procedure Laterality Date  . BACK SURGERY    . CARPAL TUNNEL RELEASE Right   . CESAREAN SECTION  1988  . COLONOSCOPY    . EXPLORATORY LAPAROTOMY  1991   "took out fatty tumor" (11/09/2012)  . LEFT HEART CATH AND CORONARY ANGIOGRAPHY N/A 08/06/2016   Procedure: Left Heart Cath and Coronary Angiography;  Surgeon: Lyn Records, MD;  Location: [facility] INVASIVE CV LAB;  Service: Cardiovascular;  Laterality: N/A;  . LEFT HEART CATH AND CORONARY ANGIOGRAPHY N/A 07/12/2018   Procedure: LEFT HEART CATH AND CORONARY ANGIOGRAPHY;  Surgeon: Marykay Lex, MD;  Location: [facility] INVASIVE CV LAB;  Service: Cardiovascular;  Laterality: N/A;  . LUMBAR LAMINECTOMY/DECOMPRESSION MICRODISCECTOMY  11/09/2012   "L3-4" (11/09/2012)  . LUMBAR LAMINECTOMY/DECOMPRESSION MICRODISCECTOMY N/A 11/09/2012   Procedure: L3-L4 DECOMPRESSION AND MICRODISCECTOMY   (1  LEVEL);  Surgeon: Venita Lick, MD;  Location: [facility] OR;  Service: Orthopedics;  Laterality: N/A;     OB History   No obstetric history on file.     Family History  Problem Relation Age of Onset  . Heart disease Mother   . CAD Mother   . Stroke Mother   . Heart disease Sister   . Heart attack Sister   . Liver disease Brother   . Other Father        unknown medical history  . CAD Brother   . Colon cancer Neg Hx   . Esophageal cancer Neg Hx   . Rectal cancer Neg Hx   . Stomach cancer Neg Hx      Social History   Tobacco Use  . Smoking status: Never Smoker  . Smokeless tobacco: Never Used  Vaping Use  . Vaping Use: Never used  Substance Use Topics  . Alcohol use: Yes    Alcohol/week: 7.0 standard drinks    Types: 7 Glasses of wine per week    Comment: social  . Drug use: No    Home Medications Prior to Admission medications   Medication Sig Start Date End Date Taking? Authorizing Provider  acetaminophen (TYLENOL) 500 MG tablet Take 1,000 mg by mouth every 6 (six) hours as needed for headache (pain).    [provider]  aspirin EC 81 MG EC tablet Take 1 tablet (81 mg total) by mouth daily. 07/14/18   Joseph Art, DO  atorvastatin (LIPITOR) 80 MG tablet TAKE 1 TABLET (80 MG TOTAL) BY MOUTH DAILY AT 6 PM. 01/29/19   Chilton Si, MD  carvedilol (COREG) 6.25 MG tablet TAKE 1 TABLET (6.25 MG TOTAL) BY MOUTH 2 (TWO) TIMES DAILY WITH A MEAL. 12/01/18   Chilton Si, MD  cholecalciferol (VITAMIN D3) 25 MCG (1000 UNIT) tablet Take 1,000 Units by mouth daily.    [provider]  clopidogrel (PLAVIX) 75 MG tablet TAKE 1 TABLET (75 MG TOTAL) BY MOUTH DAILY WITH BREAKFAST. 01/29/19   Chilton Si, MD  Diclofenac Potassium,Migraine, (CAMBIA) 50 MG PACK Take 50 mg by mouth as needed. 12/21/18   Glean Salvo, NP  ezetimibe (ZETIA) 10 MG tablet Take 1 tablet (10 mg total) by mouth daily. 04/19/19 07/18/19  Chilton Si, MD  fluticasone [facility]) 50 MCG/ACT nasal spray Place 2 sprays into both nostrils daily as needed for allergies or rhinitis.    [provider]  levocetirizine (XYZAL) 5 MG tablet Take 1 tablet (5 mg total) by mouth daily. 06/22/19   Dorothyann Peng, MD  Magnesium 250 MG TABS TAKE 1 TABLET BY MOUTH DAILY WITH EVENING MEALS 03/07/19   Dorothyann Peng, MD  meloxicam (MOBIC) 15 MG tablet Take 1 tablet (15 mg total) by mouth daily as needed for pain. 06/21/19   Levert Feinstein, MD  nitroGLYCERIN (NITROSTAT) 0.4 MG SL tablet Place 1 tablet  (0.4 mg total) under the tongue every 5 (five) minutes as needed for chest pain. 07/13/18   Joseph Art, DO  olmesartan-hydrochlorothiazide (BENICAR HCT) 40-25 MG tablet Take 1 tablet by mouth daily. 08/03/19   Dorothyann Peng, MD  tiZANidine (ZANAFLEX) 4 MG tablet Take 1 tablet (4 mg total) by mouth every 8 (eight) hours as needed for muscle spasms. 03/10/19   Domenick Gong, MD  topiramate (TOPAMAX) 100 MG tablet Take 1 tablet (100 mg total) by mouth at bedtime. 06/21/19   Levert Feinstein, MD  traMADol (ULTRAM) 50 MG tablet TAKE 1 TABLET (50  MG TOTAL) BY MOUTH EVERY 6 (SIX) HOURS AS NEEDED. 06/07/19   Dorothyann PengSanders, Robyn, MD  traMADol (ULTRAM) 50 MG tablet Take 1 tablet (50 mg total) by mouth every 12 (twelve) hours as needed. 06/21/19   Levert FeinsteinYan, Yijun, MD    Allergies    Patient has no known allergies.  Review of Systems   Review of Systems  All other systems reviewed and are negative.   Physical Exam Updated Vital Signs BP (!) 190/105   Pulse 86   Temp 98.1 F (36.7 C) (Oral)   Resp 23   LMP 06/03/2011   SpO2 100%   Physical Exam Vitals and nursing note reviewed.  Constitutional:      General: She is not in acute distress.    Appearance: She is well-developed.  HENT:     Head: Atraumatic.  Eyes:     Conjunctiva/sclera: Conjunctivae normal.  Cardiovascular:     Rate and Rhythm: Normal rate and regular rhythm.     Pulses: Normal pulses.     Heart sounds: Normal heart sounds.  Pulmonary:     Effort: Pulmonary effort is normal.     Breath sounds: Normal breath sounds.  Abdominal:     General: Abdomen is flat.     Palpations: Abdomen is soft.     Tenderness: There is no abdominal tenderness.  Musculoskeletal:     Cervical back: Neck supple.  Skin:    Findings: No rash.  Neurological:     Mental Status: She is alert and oriented to person, place, and time.     Comments: Neurologic exam:  Speech clear, pupils equal round reactive to light, extraocular movements intact  Normal  peripheral visual fields Cranial nerves III through XII normal including no facial droop Follows commands, moves all extremities x4, normal strength to bilateral upper and lower extremities at all major muscle groups including grip Sensation normal to light touch  Coordination intact, no limb ataxia, finger-nose-finger normal Rapid alternating movements normal No pronator drift Gait normal   Psychiatric:        Mood and Affect: Mood normal.     ED Results / Procedures / Treatments   Labs (all labs ordered are listed, but only abnormal results are displayed) Labs Reviewed  CBC - Abnormal; Notable for the following components:      Result Value   RBC 3.73 (*)    Hemoglobin 10.7 (*)    HCT 35.0 (*)    All other components within normal limits  COMPREHENSIVE METABOLIC PANEL - Abnormal; Notable for the following components:   Glucose, Bld 117 (*)    All other components within normal limits  CBG MONITORING, ED - Abnormal; Notable for the following components:   Glucose-Capillary 100 (*)    All other components within normal limits  RAPID URINE DRUG SCREEN, HOSP PERFORMED  URINALYSIS, ROUTINE W REFLEX MICROSCOPIC    EKG EKG Interpretation  Date/Time:  Tuesday August 07 2019 04:28:48 EDT Ventricular Rate:  83 PR Interval:    QRS Duration: 82 QT Interval:  342 QTC Calculation: 402 R Axis:   45 Text Interpretation: Sinus rhythm Confirmed by Ross MarcusHorton, Courtney ([id number]) on 08/07/2019 4:30:21 AM   Radiology CT HEAD WO CONTRAST  Result Date: 08/07/2019 CLINICAL DATA:  Left-sided facial droop and weakness.  Headache. EXAM: CT HEAD WITHOUT CONTRAST TECHNIQUE: Contiguous axial images were obtained from the base of the skull through the vertex without intravenous contrast. COMPARISON:  August 18, 2018 FINDINGS: Brain: There is subarachnoid hemorrhage in the  right superior frontal lobe region. There is subtle edema in this area as well. No hemorrhage seen elsewhere. No well-defined mass.  Ventricles and sulci appear unremarkable. Elsewhere brain parenchyma appears unremarkable. Vascular: There is no appreciable hyperdense vessel. There is calcification in the carotid siphon regions bilaterally. Skull: The bony calvarium appears intact. Sinuses/Orbits: Visualized paranasal sinuses are clear. Orbits appear symmetric bilaterally. Other: Mastoid air cells are clear. IMPRESSION: Subarachnoid hemorrhage with subtle edema in the right superior frontal lobe anteriorly. Suspect early hemorrhagic infarct in this area. No other findings suggesting hemorrhage elsewhere. No extra-axial fluid collection. Elsewhere brain parenchyma appears unremarkable. There are foci of arterial vascular calcification. Critical Value/emergent results were called by telephone at the time of interpretation on 08/07/2019 at 6:03 am to provider [facility] , who verbally acknowledged these results. Electronically Signed   By: Bretta Bang III M.D.   On: 08/07/2019 06:04    Procedures .Critical Care Performed by: Fayrene Helper, PA-C Authorized by: Fayrene Helper, PA-C   Critical care provider statement:    Critical care time (minutes):  40   Critical care was time spent personally by me on the following activities:  Discussions with consultants, evaluation of patient's response to treatment, examination of patient, ordering and performing treatments and interventions, ordering and review of laboratory studies, ordering and review of radiographic studies, pulse oximetry, re-evaluation of patient's condition, obtaining history from patient or surrogate and review of old charts   (including critical care time)  Medications Ordered in ED Medications  clevidipine (CLEVIPREX) infusion 0.5 mg/mL (16 mg/hr Intravenous Rate/Dose Change 08/07/19 0637)  aspirin tablet 325 mg (325 mg Oral Given 08/07/19 0442)    ED Course  I have reviewed the triage vital signs and the nursing notes.  Pertinent labs & imaging results that were  available during my care of the patient were reviewed by me and considered in my medical decision making (see chart for details).    MDM Rules/Calculators/A&P                          BP (!) 190/105   Pulse 86   Temp 98.1 F (36.7 C) (Oral)   Resp 23   LMP 06/03/2011   SpO2 100%   Final Clinical Impression(s) / ED Diagnoses Final diagnoses:  Spontaneous subarachnoid hemorrhage (HCC)    Rx / DC Orders ED Discharge Orders    None     4:51 AM Pt here with TIA sxs.  Last known well was 10pm last night.  Currently sxs free.  ABCD2 score is 4, moderate stroke risk.  Work up initiated.  Pt has had TIA work up recently, and is currently f/u with [facility] neurology Dr. Terrace Arabia.  Has hx of complicated migraine which may contribute to her sxs.  Had recent carotid US showing Right ICA with 80-99% stenosis, and Left ICA showing 40-59% stenosis. Dr. Terrace Arabia recommends pt to f/u with vascular surgeon for further management of her carotid stenosis.  Care discussed with DR. HOrton.    6:08 AM Head CT scan demonstrated subarachnoid hemorrhage with no edema in the right superior frontal lobe anteriorly.  Suspect early hemorrhagic infarct in this area.  Will consult probably to neurology for further recommendation.  This is likely to be a spontaneous subarachnoid hemorrhage.  Unfortunately patient was given aspirin prior to her CT result.  She is also on Plavix.  6:19 AM Appreciate consultation from on call neurologist Dr. Wilford Corner who recommend head/neck CTA  along with CT head venogram (assessing for dural venous sinus thrombosis) and to also consult neurosurgery for this spontaneous subarachnoid hemorrhage. Pt started on Cleviprex for blood pressure control.  She is aware of findings.    6:43 AM I have consulted with neurosurgery team who agrees to see pt and would place a note but they do not plan on doing any intervention at this time. Pt sign out to oncoming team who will consult for admission. covid-19  screening test ordered.  SAVANA SPINA was evaluated in Emergency Department on 08/07/2019 for the symptoms described in the history of present illness. She was evaluated in the context of the global COVID-19 pandemic, which necessitated consideration that the patient might be at risk for infection with the SARS-CoV-2 virus that causes COVID-19. Institutional protocols and algorithms that pertain to the evaluation of patients at risk for COVID-19 are in a state of rapid change based on information released by regulatory bodies including the CDC and federal and state organizations. These policies and algorithms were followed during the patient's care in the ED.    Fayrene Helper, PA-C 08/07/19 [id number]    Shon Baton, MD 08/08/19 [phone]

## 2019-08-07 NOTE — Consult Note (Addendum)
Referring Physician: Dr. Merrily Pew    Chief Complaint: Right retro-orbital headache with left sided weakness  HPI: [name] is an 59 y.o. female with a PMHx including CAD, carotid atherosclerotic disease, complicated migraine, HTN, hypothyroidism, hypercholesterolemia, arthritis, chronic lower back pain, depression and vitamin D deficiency, who presented to the ED via EMS early this AM with left leg heaviness, and facial numbness. She was last normal on going to bed at 10 PM last night. She awoke at 3 AM with her heart racing, right sided retroorbital headache, left facial droop and left sided weakness, which had resolved after about 15 minutes. She had had symptoms on Monday afternoon consisting of left foot tingling that lasted about 10 minutes before resolving spontaneously.   CT obtained in the ED revealed a right sided subarachnoid hemorrhage. CTA of head and neck did not reveal any aneurysms. Neurosurgery did not feel that she was a candidate for any intervention, but recommended that ASA and Plavix be held. She has no recent history of head trauma.  She was hypertensive in the ED and was started on clevidipine infusion.  Of note, she has had a recent TIA work up and follows up with Dr. Terrace Arabia at [facility]. Recent carotid ultrasound revealed 80-99% stenosis of the right ICA, and left ICA showing 40-59% stenosis. She was recommended by Dr. Terrace Arabia to follow up with Vascular Surgery for further management of her carotid stenosis.    Home medications include ASA and Plavix, which were initially started after cardiac stent placement about one year ago. The Plavix was not discontinued at the usual time point following stenting as it was felt by Neurology to be indicated for stroke prevention in the setting of the severe right ICA stenosis.   LSN: 10 PM on Monday tPA Given: No: Subarachnoid hemorrhage  Past Medical History:  Diagnosis Date  . Allergy   . Arthritis    back   . Atypical chest pain  06/30/2016  . Back pain    DUE TO INJURY AT WORK ON05/2013  . Bruises easily   . Chronic lower back pain   . Coronary artery disease   . Depression    was on meds 2 yrs ago   . Headache(784.0)    rarely  . History of bronchitis    last time about 48yrs ago   . Hypertension    takes Benicar daily  . Hypothyroidism 06/30/2016  . Pure hypercholesterolemia 05/31/2019  . Vitamin D deficiency    takes Vitamin D 2 times/wk  . Weakness    and numbness right foot    Past Surgical History:  Procedure Laterality Date  . BACK SURGERY    . CARPAL TUNNEL RELEASE Right   . CESAREAN SECTION  1988  . COLONOSCOPY    . EXPLORATORY LAPAROTOMY  1991   "took out fatty tumor" (11/09/2012)  . LEFT HEART CATH AND CORONARY ANGIOGRAPHY N/A 08/06/2016   Procedure: Left Heart Cath and Coronary Angiography;  Surgeon: Lyn Records, MD;  Location: [facility] INVASIVE CV LAB;  Service: Cardiovascular;  Laterality: N/A;  . LEFT HEART CATH AND CORONARY ANGIOGRAPHY N/A 07/12/2018   Procedure: LEFT HEART CATH AND CORONARY ANGIOGRAPHY;  Surgeon: Marykay Lex, MD;  Location: [facility] INVASIVE CV LAB;  Service: Cardiovascular;  Laterality: N/A;  . LUMBAR LAMINECTOMY/DECOMPRESSION MICRODISCECTOMY  11/09/2012   "L3-4" (11/09/2012)  . LUMBAR LAMINECTOMY/DECOMPRESSION MICRODISCECTOMY N/A 11/09/2012   Procedure: L3-L4 DECOMPRESSION AND MICRODISCECTOMY   (1 LEVEL);  Surgeon: Venita Lick, MD;  Location: [facility]  OR;  Service: Orthopedics;  Laterality: N/A;    Family History  Problem Relation Age of Onset  . Heart disease Mother   . CAD Mother   . Stroke Mother   . Heart disease Sister   . Heart attack Sister   . Liver disease Brother   . Other Father        unknown medical history  . CAD Brother   . Colon cancer Neg Hx   . Esophageal cancer Neg Hx   . Rectal cancer Neg Hx   . Stomach cancer Neg Hx    Social History:  reports that she has never smoked. She has never used smokeless tobacco. She reports current alcohol use of  about 7.0 standard drinks of alcohol per week. She reports that she does not use drugs.  Allergies: No Known Allergies  Medications:  Prior to Admission:  Medications Prior to Admission  Medication Sig Dispense Refill Last Dose  . acetaminophen (TYLENOL) 500 MG tablet Take 1,000 mg by mouth every 6 (six) hours as needed for headache (pain).   Past Week at Unknown time  . aspirin EC 81 MG EC tablet Take 1 tablet (81 mg total) by mouth daily.   08/06/2019 at Unknown time  . atorvastatin (LIPITOR) 80 MG tablet TAKE 1 TABLET (80 MG TOTAL) BY MOUTH DAILY AT 6 PM. 90 tablet 3 08/06/2019 at Unknown time  . carvedilol (COREG) 6.25 MG tablet TAKE 1 TABLET (6.25 MG TOTAL) BY MOUTH 2 (TWO) TIMES DAILY WITH A MEAL. 180 tablet 2 08/06/2019 at 7am time  . cholecalciferol (VITAMIN D3) 25 MCG (1000 UNIT) tablet Take 1,000 Units by mouth daily.   08/06/2019 at Unknown time  . clopidogrel (PLAVIX) 75 MG tablet TAKE 1 TABLET (75 MG TOTAL) BY MOUTH DAILY WITH BREAKFAST. 90 tablet 3 Past Week at Unknown time  . fluticasone (FLONASE) 50 MCG/ACT nasal spray Place 2 sprays into both nostrils daily as needed for allergies or rhinitis.   08/06/2019 at Unknown time  . levocetirizine (XYZAL) 5 MG tablet Take 1 tablet (5 mg total) by mouth daily. 90 tablet 1 Past Week at Unknown time  . Magnesium 250 MG TABS TAKE 1 TABLET BY MOUTH DAILY WITH EVENING MEALS (Patient taking differently: Take 250 mg by mouth daily as needed (low magnesium). ) 30 tablet 2 2 months  . nitroGLYCERIN (NITROSTAT) 0.4 MG SL tablet Place 1 tablet (0.4 mg total) under the tongue every 5 (five) minutes as needed for chest pain. 30 tablet 1 Past Week at Unknown time  . olmesartan-hydrochlorothiazide (BENICAR HCT) 40-25 MG tablet Take 1 tablet by mouth daily. 90 tablet 0 08/06/2019 at Unknown time  . topiramate (TOPAMAX) 100 MG tablet Take 1 tablet (100 mg total) by mouth at bedtime. 90 tablet 4 Past Month at Unknown time  . traMADol (ULTRAM) 50 MG tablet Take 1  tablet (50 mg total) by mouth every 12 (twelve) hours as needed. (Patient taking differently: Take 50 mg by mouth every 12 (twelve) hours as needed for moderate pain. ) 60 tablet 0 08/06/2019 at Unknown time  . Diclofenac Potassium,Migraine, (CAMBIA) 50 MG PACK Take 50 mg by mouth as needed. (Patient not taking: Reported on 08/07/2019) 12 each 11 Not Taking at Unknown time  . ezetimibe (ZETIA) 10 MG tablet Take 1 tablet (10 mg total) by mouth daily. 30 tablet 4   . meloxicam (MOBIC) 15 MG tablet Take 1 tablet (15 mg total) by mouth daily as needed for pain. (Patient not taking:  Reported on 08/07/2019) 30 tablet 6 Not Taking at Unknown time  . tiZANidine (ZANAFLEX) 4 MG tablet Take 1 tablet (4 mg total) by mouth every 8 (eight) hours as needed for muscle spasms. (Patient not taking: Reported on 08/07/2019) 30 tablet 0 Not Taking at Unknown time  . traMADol (ULTRAM) 50 MG tablet TAKE 1 TABLET (50 MG TOTAL) BY MOUTH EVERY 6 (SIX) HOURS AS NEEDED. (Patient not taking: Reported on 08/07/2019) 30 tablet 0 Not Taking at Unknown time   Scheduled: . atorvastatin  80 mg Oral q1800  . carvedilol  6.25 mg Oral BID WC  . irbesartan  300 mg Oral Daily   And  . hydrochlorothiazide  25 mg Oral Daily  . niMODipine  60 mg Oral Q4H   Continuous: . clevidipine Stopped (08/07/19 1358)    ROS: The patient feels that her left sided weakness has resolved. Other ROS as per HPI.   Physical Examination: Blood pressure (!) 131/59, pulse 77, temperature 98.1 F (36.7 C), resp. rate 17, height  (1.549 m), weight 87.1 kg, last menstrual period 06/03/2011, SpO2 99 %.  HEENT: [place]/AT Lungs: Respirations unlabored Ext: No edema  Neurologic Examination: Mental Status: Alert, fully oriented, thought content appropriate.  Speech fluent without evidence of aphasia.  Able to follow all commands without difficulty. Cranial Nerves: II:  Visual fields intact without extinction to DSS. PERRL.  III,IV, VI: No ptosis. EOMI. No  nystagmus.   V,VII: Smile symmetric, facial temp sensation normal bilaterally VIII: hearing intact to voice IX,X: No hypophonia XI: Symmetric shoulder shrug XII: Midline tongue extension  Motor: Right : Upper extremity   5/5    Left:     Upper extremity   5/5  Lower extremity   5/5     Lower extremity   5/5 No pronator drift Subtle lag present on the left as compared to the right when asked to elevate lower extremities antigravity in alternating fashion Sensory: Temp and light touch intact in all 4 limbs when tested individually. There is extinction on the left to DSS.  Deep Tendon Reflexes:  Slightly hyporeflexic LUE relative to the right 2+ bilateral patellae with left slightly brisker than right Toes downgoing Cerebellar: No ataxia with FNF bilaterally  Gait: Deferred  Results for orders placed or performed during the hospital encounter of 08/07/19 (from the past 48 hour(s))  CBC     Status: Abnormal   Collection Time: 08/07/19  4:33 AM  Result Value Ref Range   WBC 9.7 4.0 - 10.5 K/uL   RBC 3.73 (L) 3.87 - 5.11 MIL/uL   Hemoglobin 10.7 (L) 12.0 - 15.0 g/dL   HCT 16.1 (L) 36 - 46 %   MCV 93.8 80.0 - 100.0 fL   MCH 28.7 26.0 - 34.0 pg   MCHC 30.6 30.0 - 36.0 g/dL   RDW 09.6 04.5 - 40.9 %   Platelets 321 150 - 400 K/uL   nRBC 0.0 0.0 - 0.2 %    Comment: Performed at [facility], [address], [place], [place] [address]  Comprehensive metabolic panel     Status: Abnormal   Collection Time: 08/07/19  4:33 AM  Result Value Ref Range   Sodium 140 135 - 145 mmol/L   Potassium 4.0 3.5 - 5.1 mmol/L   Chloride 108 98 - 111 mmol/L   CO2 24 22 - 32 mmol/L   Glucose, Bld 117 (H) 70 - 99 mg/dL    Comment: Glucose reference range applies only to  samples taken after fasting for at least 8 hours.   BUN 17 6 - 20 mg/dL   Creatinine, Ser 1.61 0.44 - 1.00 mg/dL   Calcium 9.2 8.9 - 09.6 mg/dL   Total Protein 6.8 6.5 - 8.1 g/dL   Albumin 3.7 3.5 - 5.0 g/dL   AST 21 15 - 41 U/L    ALT 21 0 - 44 U/L   Alkaline Phosphatase 63 38 - 126 U/L   Total Bilirubin 0.7 0.3 - 1.2 mg/dL   GFR calc non Af Amer >60 >60 mL/min   GFR calc Af Amer >60 >60 mL/min   Anion gap 8 5 - 15    Comment: Performed at [facility], [address], [place], [place] [address]  CBG monitoring, ED     Status: Abnormal   Collection Time: 08/07/19  4:44 AM  Result Value Ref Range   Glucose-Capillary 100 (H) 70 - 99 mg/dL    Comment: Glucose reference range applies only to samples taken after fasting for at least 8 hours.  SARS Coronavirus 2 by RT PCR (hospital order, performed in [facility] hospital lab) Nasopharyngeal     Status: None   Collection Time: 08/07/19  6:38 AM   Specimen: Nasopharyngeal  Result Value Ref Range   SARS Coronavirus 2 NEGATIVE NEGATIVE    Comment: (NOTE) SARS-CoV-2 target nucleic acids are NOT DETECTED.  The SARS-CoV-2 RNA is generally detectable in upper and lower respiratory specimens during the acute phase of infection. The lowest concentration of SARS-CoV-2 viral copies this assay can detect is 250 copies / mL. A negative result does not preclude SARS-CoV-2 infection and should not be used as the sole basis for treatment or other patient management decisions.  A negative result may occur with improper specimen collection / handling, submission of specimen other than nasopharyngeal swab, presence of viral mutation(s) within the areas targeted by this assay, and inadequate number of viral copies (<250 copies / mL). A negative result must be combined with clinical observations, patient history, and epidemiological information.  Fact Sheet for Patients:   BoilerBrush.com.cy  Fact Sheet for Healthcare Providers: https://pope.com/  This test is not yet approved or  cleared by the [place] FDA and has been authorized for detection and/or diagnosis of SARS-CoV-2 by FDA under an Emergency Use Authorization  (EUA).  This EUA will remain in effect (meaning this test can be used) for the duration of the COVID-19 declaration under Section 564(b)(1) of the Act, 21 U.S.C. section 360bbb-3(b)(1), unless the authorization is terminated or revoked sooner.  Performed at [facility], [address] [place], [place] [address]   Urine rapid drug screen (hosp performed)not at [facility]     Status: None   Collection Time: 08/07/19  6:54 AM  Result Value Ref Range   Opiates NONE DETECTED NONE DETECTED   Cocaine NONE DETECTED NONE DETECTED   Benzodiazepines NONE DETECTED NONE DETECTED   Amphetamines NONE DETECTED NONE DETECTED   Tetrahydrocannabinol NONE DETECTED NONE DETECTED   Barbiturates NONE DETECTED NONE DETECTED    Comment: (NOTE) DRUG SCREEN FOR MEDICAL PURPOSES ONLY.  IF CONFIRMATION IS NEEDED FOR ANY PURPOSE, NOTIFY LAB WITHIN 5 DAYS.  LOWEST DETECTABLE LIMITS FOR URINE DRUG SCREEN Drug Class                     Cutoff (ng/mL) Amphetamine and metabolites    1000 Barbiturate and metabolites    200 Benzodiazepine  200 Tricyclics and metabolites     300 Opiates and metabolites        300 Cocaine and metabolites        300 THC                            50 Performed at [facility], [address], [place], [place] [address]   Urinalysis, Routine w reflex microscopic     Status: Abnormal   Collection Time: 08/07/19  6:54 AM  Result Value Ref Range   Color, Urine STRAW (A) YELLOW   APPearance CLEAR CLEAR   Specific Gravity, Urine 1.005 1.005 - 1.030   pH 6.0 5.0 - 8.0   Glucose, UA NEGATIVE NEGATIVE mg/dL   Hgb urine dipstick SMALL (A) NEGATIVE   Bilirubin Urine NEGATIVE NEGATIVE   Ketones, ur NEGATIVE NEGATIVE mg/dL   Protein, ur NEGATIVE NEGATIVE mg/dL   Nitrite NEGATIVE NEGATIVE   Leukocytes,Ua TRACE (A) NEGATIVE   RBC / HPF 0-5 0 - 5 RBC/hpf   WBC, UA 0-5 0 - 5 WBC/hpf   Bacteria, UA NONE SEEN NONE SEEN   Squamous Epithelial / LPF 0-5 0 - 5   Mucus  PRESENT     Comment: Performed at [facility], [address] [place], [place] [address]  TSH     Status: None   Collection Time: 08/07/19 11:22 AM  Result Value Ref Range   TSH 1.871 0.350 - 4.500 uIU/mL    Comment: Performed by a 3rd Generation assay with a functional sensitivity of <=0.01 uIU/mL. Performed at [facility], [address] [place], [place] [address]   Protime-INR     Status: None   Collection Time: 08/07/19 11:22 AM  Result Value Ref Range   Prothrombin Time 13.4 11.4 - 15.2 seconds   INR 1.1 0.8 - 1.2    Comment: (NOTE) INR goal varies based on device and disease states. Performed at [facility], [address] [place], [place] [address]   APTT     Status: None   Collection Time: 08/07/19 11:22 AM  Result Value Ref Range   aPTT 25 24 - 36 seconds    Comment: Performed at [facility], [address] [place], [place] [address]  HIV Antibody (routine testing w rflx)     Status: None   Collection Time: 08/07/19 11:22 AM  Result Value Ref Range   HIV Screen 4th Generation wRfx Non Reactive Non Reactive    Comment: Performed at [facility], [address] [place], [place] [address]   CT Angio Head W or Wo Contrast  Result Date: 08/07/2019 CLINICAL DATA:  Left-sided facial droop and weakness that resolved but has subsequently returned. EXAM: CT ANGIOGRAPHY HEAD AND NECK TECHNIQUE: Multidetector CT imaging of the head and neck was performed using the standard protocol during bolus administration of intravenous contrast. Multiplanar CT image reconstructions and MIPs were obtained to evaluate the vascular anatomy. Carotid stenosis measurements (when applicable) are obtained utilizing NASCET criteria, using the distal internal carotid diameter as the denominator. CONTRAST:  75mL OMNIPAQUE IOHEXOL 350 MG/ML SOLN COMPARISON:  MRA of the head neck 08/18/2018 FINDINGS: CTA NECK FINDINGS Aortic arch: Atheromatous plaque. No acute finding.  Three vessel branching. Right carotid system: Bifurcation of the common carotid is near the mandibular angle. There is primarily calcified plaque at the bulb with ICA occlusion, a progression from prior when  there was critical stenosis. The downstream vessel is likely collapsed. Left carotid system: Mixed density mainly calcified plaque at the proximal ICA with 75% stenosis based on source images. No ulceration or dissection is seen. Vertebral arteries: No proximal subclavian stenosis. The left vertebral artery is dominant. No vertebral stenosis or beading. Skeleton: No acute finding. Degenerative facet spurring at multiple levels. Other neck: No emergent finding. Upper chest: Negative Review of the MIP images confirms the above findings CTA HEAD FINDINGS Anterior circulation: Complete circle-of-Willis with hypoplastic right A1 segment and small anterior communicating artery. The right ICA is reconstituted at the level of the cavernous segment with even greater opacification flow at the level of the posterior communicating artery. No branch occlusion, beading, or aneurysm. No visible vascular malformation. Posterior circulation: Vertebral and basilar arteries are smooth and widely patent. No branch occlusion, beading, or aneurysm. Mild right P2/3 segment stenosis. Venous sinuses: Reported on dedicated study Anatomic variants: As above Review of the MIP images confirms the above findings IMPRESSION: 1. Right ICA occlusion in the neck with intracranial reconstitution. No branch occlusion, vascular malformation, aneurysm, or visible vasculitis to correlate with the subarachnoid hemorrhage. 2. 75% left proximal ICA stenosis. 3. No stenosis seen in the posterior circulation. Electronically Signed   By: Marnee Spring M.D.   On: 08/07/2019 07:53   CT HEAD WO CONTRAST  Result Date: 08/07/2019 CLINICAL DATA:  Left-sided facial droop and weakness.  Headache. EXAM: CT HEAD WITHOUT CONTRAST TECHNIQUE: Contiguous axial  images were obtained from the base of the skull through the vertex without intravenous contrast. COMPARISON:  August 18, 2018 FINDINGS: Brain: There is subarachnoid hemorrhage in the right superior frontal lobe region. There is subtle edema in this area as well. No hemorrhage seen elsewhere. No well-defined mass. Ventricles and sulci appear unremarkable. Elsewhere brain parenchyma appears unremarkable. Vascular: There is no appreciable hyperdense vessel. There is calcification in the carotid siphon regions bilaterally. Skull: The bony calvarium appears intact. Sinuses/Orbits: Visualized paranasal sinuses are clear. Orbits appear symmetric bilaterally. Other: Mastoid air cells are clear. IMPRESSION: Subarachnoid hemorrhage with subtle edema in the right superior frontal lobe anteriorly. Suspect early hemorrhagic infarct in this area. No other findings suggesting hemorrhage elsewhere. No extra-axial fluid collection. Elsewhere brain parenchyma appears unremarkable. There are foci of arterial vascular calcification. Critical Value/emergent results were called by telephone at the time of interpretation on 08/07/2019 at 6:03 am to provider [facility] , who verbally acknowledged these results. Electronically Signed   By: Bretta Bang III M.D.   On: 08/07/2019 06:04   CT Angio Neck W and/or Wo Contrast  Result Date: 08/07/2019 CLINICAL DATA:  Left-sided facial droop and weakness that resolved but has subsequently returned. EXAM: CT ANGIOGRAPHY HEAD AND NECK TECHNIQUE: Multidetector CT imaging of the head and neck was performed using the standard protocol during bolus administration of intravenous contrast. Multiplanar CT image reconstructions and MIPs were obtained to evaluate the vascular anatomy. Carotid stenosis measurements (when applicable) are obtained utilizing NASCET criteria, using the distal internal carotid diameter as the denominator. CONTRAST:  75mL OMNIPAQUE IOHEXOL 350 MG/ML SOLN COMPARISON:  MRA of  the head neck 08/18/2018 FINDINGS: CTA NECK FINDINGS Aortic arch: Atheromatous plaque. No acute finding. Three vessel branching. Right carotid system: Bifurcation of the common carotid is near the mandibular angle. There is primarily calcified plaque at the bulb with ICA occlusion, a progression from prior when there was critical stenosis. The downstream vessel is likely collapsed. Left carotid system: Mixed density mainly calcified plaque  at the proximal ICA with 75% stenosis based on source images. No ulceration or dissection is seen. Vertebral arteries: No proximal subclavian stenosis. The left vertebral artery is dominant. No vertebral stenosis or beading. Skeleton: No acute finding. Degenerative facet spurring at multiple levels. Other neck: No emergent finding. Upper chest: Negative Review of the MIP images confirms the above findings CTA HEAD FINDINGS Anterior circulation: Complete circle-of-Willis with hypoplastic right A1 segment and small anterior communicating artery. The right ICA is reconstituted at the level of the cavernous segment with even greater opacification flow at the level of the posterior communicating artery. No branch occlusion, beading, or aneurysm. No visible vascular malformation. Posterior circulation: Vertebral and basilar arteries are smooth and widely patent. No branch occlusion, beading, or aneurysm. Mild right P2/3 segment stenosis. Venous sinuses: Reported on dedicated study Anatomic variants: As above Review of the MIP images confirms the above findings IMPRESSION: 1. Right ICA occlusion in the neck with intracranial reconstitution. No branch occlusion, vascular malformation, aneurysm, or visible vasculitis to correlate with the subarachnoid hemorrhage. 2. 75% left proximal ICA stenosis. 3. No stenosis seen in the posterior circulation. Electronically Signed   By: Marnee SpringJonathon  Watts M.D.   On: 08/07/2019 07:53   MR BRAIN WO CONTRAST  Result Date: 08/07/2019 CLINICAL DATA:  Right  frontal headache 3 days ago. Left-sided weakness. Right ICA occlusion. Left ICA stenosis. EXAM: MRI HEAD WITHOUT CONTRAST TECHNIQUE: Multiplanar, multiecho pulse sequences of the brain and surrounding structures were obtained without intravenous contrast. COMPARISON:  CT studies same day.  MRI 08/18/2018 FINDINGS: Brain: Diffusion imaging shows 2 adjacent subcentimeter foci of acute infarction in the deep insula on the right. Involvement of the cortex and external capsule. No other acute infarction. No mass lesion, hemorrhage, hydrocephalus or extra-axial collection. Subarachnoid hemorrhage is evident within the sulci of the right frontal and parietal region. No sign of intraparenchymal hemorrhage. Brain does not show a pattern of old ischemic changes. No hydrocephalus. No extra-axial collection. Vascular: Major vessels at the base of the brain show flow. Skull and upper cervical spine: Negative Sinuses/Orbits: Clear/normal Other: None IMPRESSION: Two subcentimeter foci of acute infarction in the right deep insula, 1 affecting the cortex in the other affecting the external capsule region. Subarachnoid hemorrhage within the sulci on the right as seen by previous CT. Electronically Signed   By: Paulina FusiMark  Shogry M.D.   On: 08/07/2019 10:14   CT VENOGRAM HEAD  Result Date: 08/07/2019 CLINICAL DATA:  Subarachnoid hemorrhage, evaluate for dural sinus thrombosis. EXAM: CT VENOGRAM HEAD TECHNIQUE: Venous timed CT of the head was performed after bolus administration of iodinated contrast for CTA. CONTRAST:  75mL OMNIPAQUE IOHEXOL 350 MG/ML SOLN COMPARISON:  Head CT from earlier today FINDINGS: The major dural sinuses are patent. No detected tangle of vessels, cortical vein thrombosis, or venous distension. Deep veins are symmetrically opacified. No detected increase in the subarachnoid hemorrhage centered at the superior right frontal lobe. IMPRESSION: No evidence of venous thrombosis. Electronically Signed   By: Marnee SpringJonathon   Watts M.D.   On: 08/07/2019 08:16    Assessment: 59 y.o. female with subarachnoid hemorrhage involving the sulci of the anterior right frontal lobe, in addition to two adjacent punctate acute ischemic infarctions involving the right insula.  1. Exam reveals left sided extinction to DSS and subtle motor finding affecting the LLE, as well as mild asymmetry of reflexes.  2. CT head: Subarachnoid hemorrhage with subtle edema in the right superior frontal lobe anteriorly.  3. CTA head and neck:  Right ICA occlusion in the neck with intracranial reconstitution. 75% left proximal ICA stenosis. No stenosis seen in the posterior circulation. No branch occlusion, vascular malformation, aneurysm, or visible vasculitis to correlate with the subarachnoid hemorrhage.  4. CT venogram of brain:  No evidence of venous thrombosis.  5. MRI brain: Two subcentimeter foci of acute infarction in the right deep insula, one affecting the cortex and the other affecting the external capsule region. Again noted is subarachnoid hemorrhage within the sulci on the right as seen by previous CT. 6. Stroke Risk Factors - CAD, carotid atherosclerotic disease, complicated migraine, HTN and hypercholesterolemia. 7. Recent carotid ultrasound (06/27/19): Right Carotid: Velocities in the right ICA are consistent with a 80-99% stenosis. Left Carotid: Velocities in the left ICA are consistent with a 40-59% stenosis. Vertebrals: Bilateral vertebral arteries demonstrate antegrade flow. Subclavians: Left subclavian artery was stenotic. Normal flow hemodynamics were seen in the right subclavian artery 8. Echocardiogram in July 2018 had shown an EF of 65% to 70% and grade 2 diastolic dysfunction 9. Per the literature (AJNR 32, March 2011), there have been several cases reported of spontaneous cortical subarachnoid hemorrhage in the setting of extracranial carotid artery stenosis. Images from one case report exhibit a distribution of cortical blood  strikingly similar to our patient. Proposed mechanism of action of the patient's presentation: Severe right ICA stenosis >> progression to acute right ICA occlusion with stuttering TIA symptoms and subarachnoid hemorrhage >> small strokes in the right insula from stump embolism   Recommendations: 1. Continue off Plavix and ASA for approximately one week. At that time obtain repeat CT head. If subarachnoid hemorrhage is resorbing without new occurrence, then most likely will need to restart ASA both to prevent recurrent strokes as well as to prevent cardiac stent restenosis. May need to hold off on Plavix for a longer period of time, or indefinitely 2. Catheter-based angiogram of the right carotid artery in intracranial circulation (via left carotid) to assess for possible string sign, which would be an indication for right ICA intervention, versus source(s) and hemodynamic characteristics of collateral flow to the right MCA territory if the right ICA is revealed to be completely occluded.  3. PT consult, OT consult, Speech consult 4. TTE to assess for mural thrombus as well as possible worsening of diastolic dysfunction since last echocardiogram performed in 2018.  5. Continue high-dose atorvastatin 6. SBP goal of 120-150. Do not want too low due to likelihood that collateral flow is predominantly the supply for the right MCA, but also not too high due to the presence of subarachnoid hemorrhage. RN has been informed of the new goal. BP currently below goal (11:30 PM); bolusing 500 cc NS followed by infusion at 75 cc/hr 7. Risk factor modification 8. Telemetry monitoring 9. Frequent neuro checks 10. HgbA1c, fasting lipid panel    @Electronically  signed: Dr.  08/07/2019, 2:14 PM

## 2019-08-07 NOTE — ED Triage Notes (Signed)
Pt comes via [facility] from home, LSN at noon yesterday, L leg heaviness and facial numbness. Pt woke up at 3am with L sided facial droop and L sided weakness and then resolved after 15 minutes.

## 2019-08-07 NOTE — Consult Note (Signed)
Reason for Consult: Right convexity subarachnoid hemorrhage Referring Physician: Dr. Deri Fuelling is an 59 y.o. female.  HPI: The patient is a 59 year old black female with a history of cerebrovascular disease on aspirin and Plavix followed by Dr. Kendell Bane, neurology.  The patient woke up with a right frontal headache on Saturday.  She had some tachypnea last night and was getting dressed to go to the ER when she noted left-sided weakness.  She was brought to [facility] and a head CT scan was obtained which demonstrated a right convexity subarachnoid hemorrhage.  A stroke neurology consult and a neurosurgical consult was requested.  Presently the patient is alert and pleasant.  Her weakness has resolved.  She denies nausea, vomiting, seizures, trauma, etc.  Past Medical History:  Diagnosis Date  . Allergy   . Arthritis    back   . Atypical chest pain 06/30/2016  . Back pain    DUE TO INJURY AT WORK ON05/2013  . Bruises easily   . Chronic lower back pain   . Coronary artery disease   . Depression    was on meds 2 yrs ago   . Headache(784.0)    rarely  . History of bronchitis    last time about 51yrs ago   . Hypertension    takes Benicar daily  . Hypothyroidism 06/30/2016  . Pure hypercholesterolemia 05/31/2019  . Vitamin D deficiency    takes Vitamin D 2 times/wk  . Weakness    and numbness right foot    Past Surgical History:  Procedure Laterality Date  . BACK SURGERY    . CARPAL TUNNEL RELEASE Right   . CESAREAN SECTION  1988  . COLONOSCOPY    . EXPLORATORY LAPAROTOMY  1991   "took out fatty tumor" (11/09/2012)  . LEFT HEART CATH AND CORONARY ANGIOGRAPHY N/A 08/06/2016   Procedure: Left Heart Cath and Coronary Angiography;  Surgeon: Lyn Records, MD;  Location: [facility] INVASIVE CV LAB;  Service: Cardiovascular;  Laterality: N/A;  . LEFT HEART CATH AND CORONARY ANGIOGRAPHY N/A 07/12/2018   Procedure: LEFT HEART CATH AND CORONARY ANGIOGRAPHY;  Surgeon: Marykay Lex, MD;  Location: [facility] INVASIVE CV LAB;  Service: Cardiovascular;  Laterality: N/A;  . LUMBAR LAMINECTOMY/DECOMPRESSION MICRODISCECTOMY  11/09/2012   "L3-4" (11/09/2012)  . LUMBAR LAMINECTOMY/DECOMPRESSION MICRODISCECTOMY N/A 11/09/2012   Procedure: L3-L4 DECOMPRESSION AND MICRODISCECTOMY   (1 LEVEL);  Surgeon: Venita Lick, MD;  Location: [facility] OR;  Service: Orthopedics;  Laterality: N/A;    Family History  Problem Relation Age of Onset  . Heart disease Mother   . CAD Mother   . Stroke Mother   . Heart disease Sister   . Heart attack Sister   . Liver disease Brother   . Other Father        unknown medical history  . CAD Brother   . Colon cancer Neg Hx   . Esophageal cancer Neg Hx   . Rectal cancer Neg Hx   . Stomach cancer Neg Hx     Social History:  reports that she has never smoked. She has never used smokeless tobacco. She reports current alcohol use of about 7.0 standard drinks of alcohol per week. She reports that she does not use drugs.  Allergies: No Known Allergies  Medications:    Results for orders placed or performed during the hospital encounter of 08/07/19 (from the past 48 hour(s))  CBC     Status: Abnormal   Collection Time: 08/07/19  4:33 AM  Result Value Ref Range   WBC 9.7 4.0 - 10.5 K/uL   RBC 3.73 (L) 3.87 - 5.11 MIL/uL   Hemoglobin 10.7 (L) 12.0 - 15.0 g/dL   HCT 21.1 (L) 36 - 46 %   MCV 93.8 80.0 - 100.0 fL   MCH 28.7 26.0 - 34.0 pg   MCHC 30.6 30.0 - 36.0 g/dL   RDW 94.1 74.0 - 81.4 %   Platelets 321 150 - 400 K/uL   nRBC 0.0 0.0 - 0.2 %    Comment: Performed at [facility], [address] [place], [place] [address]  Comprehensive metabolic panel     Status: Abnormal   Collection Time: 08/07/19  4:33 AM  Result Value Ref Range   Sodium 140 135 - 145 mmol/L   Potassium 4.0 3.5 - 5.1 mmol/L   Chloride 108 98 - 111 mmol/L   CO2 24 22 - 32 mmol/L   Glucose, Bld 117 (H) 70 - 99 mg/dL    Comment: Glucose reference range applies only to  samples taken after fasting for at least 8 hours.   BUN 17 6 - 20 mg/dL   Creatinine, Ser 6.31 0.44 - 1.00 mg/dL   Calcium 9.2 8.9 - 49.7 mg/dL   Total Protein 6.8 6.5 - 8.1 g/dL   Albumin 3.7 3.5 - 5.0 g/dL   AST 21 15 - 41 U/L   ALT 21 0 - 44 U/L   Alkaline Phosphatase 63 38 - 126 U/L   Total Bilirubin 0.7 0.3 - 1.2 mg/dL   GFR calc non Af Amer >60 >60 mL/min   GFR calc Af Amer >60 >60 mL/min   Anion gap 8 5 - 15    Comment: Performed at [facility], [address], [place], [place] [address]  CBG monitoring, ED     Status: Abnormal   Collection Time: 08/07/19  4:44 AM  Result Value Ref Range   Glucose-Capillary 100 (H) 70 - 99 mg/dL    Comment: Glucose reference range applies only to samples taken after fasting for at least 8 hours.  Urinalysis, Routine w reflex microscopic     Status: Abnormal   Collection Time: 08/07/19  6:54 AM  Result Value Ref Range   Color, Urine STRAW (A) YELLOW   APPearance CLEAR CLEAR   Specific Gravity, Urine 1.005 1.005 - 1.030   pH 6.0 5.0 - 8.0   Glucose, UA NEGATIVE NEGATIVE mg/dL   Hgb urine dipstick SMALL (A) NEGATIVE   Bilirubin Urine NEGATIVE NEGATIVE   Ketones, ur NEGATIVE NEGATIVE mg/dL   Protein, ur NEGATIVE NEGATIVE mg/dL   Nitrite NEGATIVE NEGATIVE   Leukocytes,Ua TRACE (A) NEGATIVE   RBC / HPF 0-5 0 - 5 RBC/hpf   WBC, UA 0-5 0 - 5 WBC/hpf   Bacteria, UA NONE SEEN NONE SEEN   Squamous Epithelial / LPF 0-5 0 - 5   Mucus PRESENT     Comment: Performed at [facility], [address] [place], [place] [address]    CT HEAD WO CONTRAST  Result Date: 08/07/2019 CLINICAL DATA:  Left-sided facial droop and weakness.  Headache. EXAM: CT HEAD WITHOUT CONTRAST TECHNIQUE: Contiguous axial images were obtained from the base of the skull through the vertex without intravenous contrast. COMPARISON:  August 18, 2018 FINDINGS: Brain: There is subarachnoid hemorrhage in the right superior frontal lobe region. There is subtle edema in  this area as well. No hemorrhage seen elsewhere. No well-defined mass.  Ventricles and sulci appear unremarkable. Elsewhere brain parenchyma appears unremarkable. Vascular: There is no appreciable hyperdense vessel. There is calcification in the carotid siphon regions bilaterally. Skull: The bony calvarium appears intact. Sinuses/Orbits: Visualized paranasal sinuses are clear. Orbits appear symmetric bilaterally. Other: Mastoid air cells are clear. IMPRESSION: Subarachnoid hemorrhage with subtle edema in the right superior frontal lobe anteriorly. Suspect early hemorrhagic infarct in this area. No other findings suggesting hemorrhage elsewhere. No extra-axial fluid collection. Elsewhere brain parenchyma appears unremarkable. There are foci of arterial vascular calcification. Critical Value/emergent results were called by telephone at the time of interpretation on 08/07/2019 at 6:03 am to provider [facility] , who verbally acknowledged these results. Electronically Signed   By: Bretta Bang III M.D.   On: 08/07/2019 06:04    ROS: As above Blood pressure (!) 131/58, pulse 102, temperature 98.1 F (36.7 C), temperature source Oral, resp. rate 22, height 5\' 1"  (1.549 m), weight 87.1 kg, last menstrual period 06/03/2011, SpO2 100 %. Estimated body mass index is 36.28 kg/m as calculated from the following:   Height as of this encounter: 5\' 1"  (1.549 m).   Weight as of this encounter: 87.1 kg.  Physical Exam  General: Alert and pleasant 59 year old black female in no apparent distress  HEENT: Normocephalic, atraumatic, extraocular muscles intact  Neck: Unremarkable  Thorax: Symmetric  Abdomen: Obese  Extremities: Unremarkable  Neurologic exam: The patient is alert and oriented x3.  Glasgow Coma Scale 15.  Cranial nerves II through XII are examined bilaterally and grossly normal.  Vision and hearing grossly normal bilaterally.  The patient's motor strength is 5/5 in her bilateral bicep, tricep,  handgrip, gastrocnemius, and dorsiflexors.  Cerebellar function is intact to rapid alternating movements of the upper extremity bilaterally.  Sensory function is intact to light touch sensation all tested dermatomes bilaterally.  Imaging studies: I have reviewed the patient's head CT performed at [facility] today.  The patient has a right convexity subarachnoid hemorrhage with some mild edema.  I do not see any blood in the basilar cisterns.  Assessment/Plan: Subarachnoid hemorrhage: The patient is going to proceed with a brain MRI to identify any potential sources of rebleeding or lesions.  We need to hold her aspirin and Plavix presently.  The patient does not need surgery.  I will follow her with you.  46 08/07/2019, 7:51 AM

## 2019-08-08 ENCOUNTER — Inpatient Hospital Stay (HOSPITAL_COMMUNITY): Payer: 59

## 2019-08-08 DIAGNOSIS — I160 Hypertensive urgency: Secondary | ICD-10-CM

## 2019-08-08 DIAGNOSIS — I63231 Cerebral infarction due to unspecified occlusion or stenosis of right carotid arteries: Secondary | ICD-10-CM

## 2019-08-08 DIAGNOSIS — I6389 Other cerebral infarction: Secondary | ICD-10-CM

## 2019-08-08 LAB — CBC
HCT: 31.4 % — ABNORMAL LOW (ref 36.0–46.0)
Hemoglobin: 9.9 g/dL — ABNORMAL LOW (ref 12.0–15.0)
MCH: 29.3 pg (ref 26.0–34.0)
MCHC: 31.5 g/dL (ref 30.0–36.0)
MCV: 92.9 fL (ref 80.0–100.0)
Platelets: 297 10*3/uL (ref 150–400)
RBC: 3.38 MIL/uL — ABNORMAL LOW (ref 3.87–5.11)
RDW: 13.9 % (ref 11.5–15.5)
WBC: 8.6 10*3/uL (ref 4.0–10.5)
nRBC: 0 % (ref 0.0–0.2)

## 2019-08-08 LAB — BASIC METABOLIC PANEL
Anion gap: 7 (ref 5–15)
BUN: 15 mg/dL (ref 6–20)
CO2: 25 mmol/L (ref 22–32)
Calcium: 8.6 mg/dL — ABNORMAL LOW (ref 8.9–10.3)
Chloride: 107 mmol/L (ref 98–111)
Creatinine, Ser: 0.86 mg/dL (ref 0.44–1.00)
GFR calc Af Amer: >60 mL/min (ref >60)
GFR calc non Af Amer: >60 mL/min (ref >60)
Glucose, Bld: 108 mg/dL — ABNORMAL HIGH (ref 70–99)
Potassium: 3.5 mmol/L (ref 3.5–5.1)
Sodium: 139 mmol/L (ref 135–145)

## 2019-08-08 LAB — ECHOCARDIOGRAM COMPLETE
Area-P 1/2: 3.89 cm2
Height: 61 in
S' Lateral: 2.4 cm
Weight: 3072 oz

## 2019-08-08 LAB — PHOSPHORUS: Phosphorus: 3.9 mg/dL (ref 2.5–4.6)

## 2019-08-08 LAB — MAGNESIUM: Magnesium: 2 mg/dL (ref 1.7–2.4)

## 2019-08-08 MED ORDER — FLUTICASONE PROPIONATE 50 MCG/ACT NA SUSP
1.0000 | Freq: Every day | NASAL | Status: DC
  Administered 2019-08-08 – 2019-08-10 (×3): 1 via NASAL
  Filled 2019-08-08: qty 16

## 2019-08-08 MED ORDER — HYDRALAZINE HCL 20 MG/ML IJ SOLN: 10.0000 mg | INTRAMUSCULAR | Status: DC | PRN

## 2019-08-08 MED ORDER — OXYCODONE HCL 5 MG PO TABS
5.0000 mg | ORAL_TABLET | Freq: Four times a day (QID) | ORAL | Status: DC | PRN
  Administered 2019-08-08 – 2019-08-10 (×6): 5 mg via ORAL
  Filled 2019-08-08 (×6): qty 1

## 2019-08-08 MED ORDER — SODIUM CHLORIDE 0.9 % IV BOLUS
1000.0000 mL | Freq: Once | INTRAVENOUS | Status: AC
  Administered 2019-08-08: 1000 mL via INTRAVENOUS

## 2019-08-08 NOTE — Progress Notes (Signed)
  Echocardiogram 2D Echocardiogram has been performed.  [name] 08/08/2019, 11:08 AM

## 2019-08-08 NOTE — Progress Notes (Addendum)
STROKE TEAM PROGRESS NOTE   INTERVAL HISTORY No family at bedside. Pt lying in bed, neuro intact, stated that HA is improved. Had CTA head and neck showed right ICA occlusion and left ICA 75% stenosis. Felt that right convexity SAH likely due to pial collateral rupture in the setting of right ICA occlusion and left ICA high grade stenosis. Discussed with Dr. Merrily Pew, Dr. Lovell Sheehan. Recommend cerebral angio, discussed with Dr. Conchita Paris.   Vitals:   08/08/19 0600 08/08/19 0630 08/08/19 0700 08/08/19 0800  BP: (!) 103/61 117/70 (!) 136/75   Pulse: 69 74 64   Resp: 14 20 15    Temp:    99 F (37.2 C)  TempSrc:    Oral  SpO2: 97% 96% 96%   Weight:      Height:       CBC:  Recent Labs  Lab 08/07/19 0433 08/08/19 0350  WBC 9.7 8.6  HGB 10.7* 9.9*  HCT 35.0* 31.4*  MCV 93.8 92.9  PLT 321 297   Basic Metabolic Panel:  Recent Labs  Lab 08/07/19 0433 08/08/19 0350  NA 140 139  K 4.0 3.5  CL 108 107  CO2 24 25  GLUCOSE 117* 108*  BUN 17 15  CREATININE 0.95 0.86  CALCIUM 9.2 8.6*  MG  --  2.0  PHOS  --  3.9   Lipid Panel: No results for input(s): CHOL, TRIG, HDL, CHOLHDL, VLDL, LDLCALC in the last 168 hours.  HgbA1c: No results for input(s): HGBA1C in the last 168 hours. Lab Results  Component Value Date   HGBA1C 6.3 (H) 03/14/2019    Urine Drug Screen:  Recent Labs  Lab 08/07/19 0654  LABOPIA NONE DETECTED  COCAINSCRNUR NONE DETECTED  LABBENZ NONE DETECTED  AMPHETMU NONE DETECTED  THCU NONE DETECTED  LABBARB NONE DETECTED    Alcohol Level No results for input(s): ETH in the last 168 hours.  IMAGING past 24 hours No results found.  PHYSICAL EXAM  Temp:  [98 F (36.7 C)-99 F (37.2 C)] 98.5 F (36.9 C) (07/28 1227) Pulse Rate:  [64-90] 69 (07/28 1227) Resp:  [14-29] 16 (07/28 1227) BP: (88-156)/(50-124) 146/82 (07/28 1227) SpO2:  [91 %-100 %] 100 % (07/28 1227)  General - Well nourished, well developed, in no apparent distress.  Ophthalmologic - fundi  not visualized due to noncooperation.  Cardiovascular - Regular rhythm and rate.  Mental Status -  Level of arousal and orientation to time, place, and person were intact. Language including expression, naming, repetition, comprehension was assessed and found intact. Fund of Knowledge was assessed and was intact.  Cranial Nerves II - XII - II - Visual field intact OU. III, IV, VI - Extraocular movements intact. V - Facial sensation intact bilaterally. VII - Facial movement intact bilaterally. VIII - Hearing & vestibular intact bilaterally. X - Palate elevates symmetrically. XI - Chin turning & shoulder shrug intact bilaterally. XII - Tongue protrusion intact.  Motor Strength - The patient's strength was normal in all extremities and pronator drift was absent.  Bulk was normal and fasciculations were absent.   Motor Tone - Muscle tone was assessed at the neck and appendages and was normal.  Reflexes - The patient's reflexes were symmetrical in all extremities and she had no pathological reflexes.  Sensory - Light touch, temperature/pinprick were assessed and were symmetrical.    Coordination - The patient had normal movements in the hands and feet with no ataxia or dysmetria.  Tremor was absent.  Gait and Station -  deferred.   ASSESSMENT/PLAN [name] is a 59 y.o. female with history of CAD s/p stent 1 yrs ago, carotid atherosclerotic disease, complicated migraine, HTN, hypothyroidism, hypercholesterolemia, arthritis, chronic lower back pain, depression and vitamin D deficiency who had transient tachycardia, R sided HA, L facial droop and L sided weakness following by transient L foot tingling later than afternoon. Now presenting with R retro-orbital HA and L sided weakness in settin go f HTN.   Stroke: 2 tiny R insular infarcts likely due to R ICA occlusion  Left convexity small SAH due to pial collateral rupture in the setting of R ICA occlusion and left ICA high grade  stenosis. DDx including RCVS, occult aneurysm, and CSVT.   CT head SAH R superior frontal lobe. Suspect early hemorrhagic infarct frontal lobe. AV calcification.   CTA head & neck R ICA occlusion in neck w/ intracranial reconstitution. L proximal ICA 75% stenosis. No aneurysm.   CT venogram negative for CSVT  MRI  2 subcentimeter deep R insular infarcts (1 cortical, 1 external capsule).  R SAH.  2D Echo pending  LDL pending   HgbA1c pending   VTE prophylaxis - SCDs   aspirin 81 mg daily and clopidogrel 75 mg daily prior to admission, now on No antithrombotic. Plan to hold DAPT x 1 week then recheck CT. Based on results, will consider resumption.   Therapy recommendations:  pending   Disposition:  pending   Bilateral Carotid Stenosis  MRA neck Aug 2020 R ICA 80%, L 50%  CUS 06/2019 right ICA 80 to 90% stenosis, left ICA 40 to 59% stenosis  This admission, CTA head & neck R ICA occlusion in neck w/ intracranial reconstitution. L proximal ICA 75% stenosis.  Convexity SAH likely due to frail pial collateral rupture in the setting of hypoperfusion induced vascular dilatation. (please see also http://www.ajnr.org/content/32/3/E51 or http://lyons.com/)  Discussed with Dr. Conchita Paris, will do cerebral angiogram to evaluate carotid status.   Hypertensive Urgency  Treated w/ cleviprex, now off . SBP goal 130-160 now  BP on the low side  nimotop stopped as non-aneurysmal  BP will elevate some today . Continue coreg  Hyperlipidemia  Home meds:  lipitor 80 & zetia 10, statin resumed in hospital  LDL pending, goal < 70  Continue statin and zetia at discharge  Other Stroke Risk Factors  ETOH use, advised to drink no more than 2 drink(s) a day  Obesity, Body mass index is 36.28 kg/m., recommend weight loss, diet and exercise as appropriate   Family hx stroke (mother)  Coronary artery disease s/p stent 1 yr ago on DAPT, continued > 1  yrs given severe ICA stenosis    Hx HA w/ migraine features, followed by Dr. Terrace Arabia at [facility]. On topamax, cambia prn   Anemia, Hg 10.7->9.9  Other Active Problems  LBP  This patient is critically ill due to [facility], ICA occlusion / stenosis, ischemic stroke, relative hypotension and at significant risk of neurological worsening, death form SAH recurrent, recurrent stroke, hemorrhagic conversion, seizure. This patient's care requires constant monitoring of vital signs, hemodynamics, respiratory and cardiac monitoring, review of multiple databases, neurological assessment, discussion with family, other specialists and medical decision making of high complexity. I spent 40 minutes of neurocritical care time in the care of this patient. I have discussed with Dr. Merrily Pew and Dr. Lovell Sheehan. I also discussed with Dr. Conchita Paris.   Hospital day # 1  Marvel Plan, MD PhD Stroke Neurology 08/08/2019 3:28 PM    To  contact Stroke Continuity provider, please refer to [email or web address] After hours, contact General Neurology

## 2019-08-08 NOTE — Progress Notes (Signed)
Patient arrived to [phone] with sister at bedside. Alert & oriented x4. Denies pain. Previous pain med given prior to transfer. Pt meal tray at bedside. Call bell within reach. POC provided to patient. Nurse will continue to monitor.

## 2019-08-08 NOTE — Progress Notes (Signed)
NAME:  [name], MRN:  [id number], DOB:  Apr 16, 1960, LOS: 1 ADMISSION DATE:  08/07/2019, CONSULTATION DATE:  7/27 REFERRING MD:  Dr. Wilkie Aye, CHIEF COMPLAINT:  SAH   Brief History   59 year old female presented with R frontal headache found to have SAH. Admitted to ICU on clevidipine for BP control.   History of present illness   59 year old female with PMH as below, which is significant for  CAD, HTN, Hypothyroid, COVID vaccination, and migraines. Migraines are a more recent issue and has been worked up by Neurology including CT/CTA and MRI/MRA of the brain and neck about one year ago. MRI noted ICA stenosis. No structural abnormalities noted. Treated with Topamax and symptoms improved. Now 7/27 she is presenting to [facility] ED with R frontal headache x 3 days. She began to experience L sided weakness on the morning of the 27th prompting ER visit. Upon arrival to ED she underwent CT of the head, which demonstrated subarachnoid hemorrhage in the R frontal lobe with some mild edema. Notably she has received both asa and plavix. Neurosurgery was called and recommended MRI. No surgical needs at this time. She was hypertensive and started on clevidipine infusion. PCCM consulted for ICU admission.   Past Medical History   has a past medical history of Allergy, Arthritis, Atypical chest pain (06/30/2016), Back pain, Bruises easily, Chronic lower back pain, Coronary artery disease, Depression, Headache(784.0), History of bronchitis, Hypertension, Hypothyroidism (06/30/2016), Pure hypercholesterolemia (05/31/2019), Vitamin D deficiency, and Weakness.  Significant Hospital Events     Consults:  Neurosurgery  Procedures:    Significant Diagnostic Tests:  CT head 7/27 > Subarachnoid hemorrhage with subtle edema in the right superior frontal lobe anteriorly. CTA head/neck 7/27 > Right ICA occlusion in the neck with intracranial reconstitution. No branch occlusion, vascular malformation, aneurysm,  or visible vasculitis to correlate with the subarachnoid hemorrhage. 75% left proximal ICA stenosis. CT venogram 7/27 > No evidence of venous thrombosis.  MRI/A Brain/neck 7/27 > Two subcentimeter foci of acute infarction in the right deep insula, 1 affecting the cortex in the other affecting the external capsule region. Subarachnoid hemorrhage within the sulci on the right.   Micro Data:    Antimicrobials:     Interim history/subjective:  Headache worse today. All other symptoms have resolved. She is now off cleviprex infusion.   Objective   Blood pressure (!) 136/75, pulse 64, temperature 98 F (36.7 C), temperature source Oral, resp. rate 15, height 5\' 1"  (1.549 m), weight 87.1 kg, last menstrual period 06/03/2011, SpO2 96 %.        Intake/Output Summary (Last 24 hours) at 08/08/2019 0811 Last data filed at 08/08/2019 0700 Gross per 24 hour  Intake 2496.69 ml  Output --  Net 2496.69 ml   Filed Weights   08/07/19 0729  Weight: 87.1 kg    Examination: General: middle aged overweight female.  HENT: [place]/AT, PERRL, no JVD.  Lungs: Clear bilateral breath sounds Cardiovascular: RRR, no MRG Abdomen: Soft, non-tender, non-distended Extremities: ROM 5/5 in all four extremities.  Neuro: Alert, oriented, non-focal. Cranial nerves 2-12 grossly intact at this time. Sensation intact x 4 extremities.   Resolved Hospital Problem list     Assessment & Plan:   Subarachnoid hemorrhage  - Hold home plavix, aspirin x 1 week (8/4) - Neurosurgery following, no surgical need at this time - Keep SBP 130-160 mmHg per neuro considering stroke/bleed.  - Resume home atorvastatin - PRN hydrocodone.   Hypertensive crisis -  Clevidipine off since 1900 on 7/27 - Resume home carvedilol - olmesartan-HCTZ with discontinued by neurology with new SBP goal - Will add PRN hydralazine  Hypothyroid - No home medication - TSH WNL  Patient is stable off antihypertensive infusions Will transfer to  telemetry   Best practice:  Diet: Regular Pain/Anxiety/Delirium protocol (if indicated): NA VAP protocol (if indicated): NA DVT prophylaxis: SCD GI prophylaxis: NA Glucose control: NA Mobility: up with assist Code Status: FULL Family Communication: Patient updated 7/28 Disposition: ICU  Labs   CBC: Recent Labs  Lab 08/07/19 0433 08/08/19 0350  WBC 9.7 8.6  HGB 10.7* 9.9*  HCT 35.0* 31.4*  MCV 93.8 92.9  PLT 321 297    Basic Metabolic Panel: Recent Labs  Lab 08/07/19 0433 08/08/19 0350  NA 140 139  K 4.0 3.5  CL 108 107  CO2 24 25  GLUCOSE 117* 108*  BUN 17 15  CREATININE 0.95 0.86  CALCIUM 9.2 8.6*  MG  --  2.0  PHOS  --  3.9   GFR: Estimated Creatinine Clearance: 70.6 mL/min (by C-G formula based on SCr of 0.86 mg/dL). Recent Labs  Lab 08/07/19 0433 08/08/19 0350  WBC 9.7 8.6    Liver Function Tests: Recent Labs  Lab 08/07/19 0433  AST 21  ALT 21  ALKPHOS 63  BILITOT 0.7  PROT 6.8  ALBUMIN 3.7   No results for input(s): LIPASE, AMYLASE in the last 168 hours. No results for input(s): AMMONIA in the last 168 hours.  ABG    Component Value Date/Time   TCO2 29 08/18/2018 1339     Coagulation Profile: Recent Labs  Lab 08/07/19 1122  INR 1.1    Cardiac Enzymes: No results for input(s): CKTOTAL, CKMB, CKMBINDEX, TROPONINI in the last 168 hours.  HbA1C: Hgb A1c MFr Bld  Date/Time Value Ref Range Status  03/14/2019 12:17 PM 6.3 (H) 4.8 - 5.6 % Final    Comment:             Prediabetes: 5.7 - 6.4          Diabetes: >6.4          Glycemic control for adults with diabetes: <7.0   09/14/2018 10:39 AM 6.4 (H) 4.8 - 5.6 % Final    Comment:             Prediabetes: 5.7 - 6.4          Diabetes: >6.4          Glycemic control for adults with diabetes: <7.0     CBG: Recent Labs  Lab 08/07/19 0444  GLUCAP 100*        Joneen Roach, AGACNP-BC [facility]  See Amion for personal pager PCCM on call pager  [phone]  08/08/2019 8:11 AM

## 2019-08-09 LAB — BASIC METABOLIC PANEL
Anion gap: 7 (ref 5–15)
BUN: 13 mg/dL (ref 6–20)
CO2: 26 mmol/L (ref 22–32)
Calcium: 9 mg/dL (ref 8.9–10.3)
Chloride: 107 mmol/L (ref 98–111)
Creatinine, Ser: 0.83 mg/dL (ref 0.44–1.00)
GFR calc Af Amer: >60 mL/min (ref >60)
GFR calc non Af Amer: >60 mL/min (ref >60)
Glucose, Bld: 105 mg/dL — ABNORMAL HIGH (ref 70–99)
Potassium: 3.7 mmol/L (ref 3.5–5.1)
Sodium: 140 mmol/L (ref 135–145)

## 2019-08-09 LAB — CBC
HCT: 30.9 % — ABNORMAL LOW (ref 36.0–46.0)
Hemoglobin: 9.8 g/dL — ABNORMAL LOW (ref 12.0–15.0)
MCH: 29.3 pg (ref 26.0–34.0)
MCHC: 31.7 g/dL (ref 30.0–36.0)
MCV: 92.5 fL (ref 80.0–100.0)
Platelets: 340 10*3/uL (ref 150–400)
RBC: 3.34 MIL/uL — ABNORMAL LOW (ref 3.87–5.11)
RDW: 14.1 % (ref 11.5–15.5)
WBC: 10.2 10*3/uL (ref 4.0–10.5)
nRBC: 0 % (ref 0.0–0.2)

## 2019-08-09 LAB — HEMOGLOBIN A1C
Hgb A1c MFr Bld: 6.3 % — ABNORMAL HIGH (ref 4.8–5.6)
Mean Plasma Glucose: 134.11 mg/dL

## 2019-08-09 LAB — PHOSPHORUS: Phosphorus: 3.9 mg/dL (ref 2.5–4.6)

## 2019-08-09 LAB — LIPID PANEL
Cholesterol: 115 mg/dL (ref 0–200)
HDL: 37 mg/dL — ABNORMAL LOW (ref >40)
LDL Cholesterol: 68 mg/dL (ref 0–99)
Total CHOL/HDL Ratio: 3.1 RATIO
Triglycerides: 50 mg/dL (ref <150)
VLDL: 10 mg/dL (ref 0–40)

## 2019-08-09 LAB — MAGNESIUM: Magnesium: 2 mg/dL (ref 1.7–2.4)

## 2019-08-09 NOTE — Plan of Care (Signed)
  Problem: Pain Managment: Goal: General experience of comfort will improve Outcome: Progressing   Problem: Safety: Goal: Ability to remain free from injury will improve Outcome: Progressing   Problem: Skin Integrity: Goal: Risk for impaired skin integrity will decrease Outcome: Progressing   

## 2019-08-09 NOTE — Progress Notes (Signed)
PROGRESS NOTE    [name]  TDV:[id number] DOB: 1960-12-01 DOA: 08/07/2019 PCP: Dorothyann Peng, MD   Brief Narrative:  59 year old female with PMH as below, which is significant for  CAD, HTN, Hypothyroid, COVID vaccination, and migraines. Migraines are a more recent issue and has been worked up by Neurology including CT/CTA and MRI/MRA of the brain and neck about one year ago. MRI noted ICA stenosis. No structural abnormalities noted. Treated with Topamax and symptoms improved. Now 7/27 she is presenting to [facility] ED with R frontal headache x 3 days. She began to experience L sided weakness on the morning of the 27th prompting ER visit. Upon arrival to ED she underwent CT of the head, which demonstrated subarachnoid hemorrhage in the R frontal lobe with some mild edema. Notably she has received both asa and plavix. Neurosurgery was called and recommended MRI. No surgical needs at this time. She was hypertensive and started on clevidipine infusion and placed in the ICU overnight.  Blood pressure now markedly well controlled, transfer to medical floor on telemetry for remainder of work-up, neurosurgery and neurology continue to follow along we appreciate their insight and recommendations.   Assessment & Plan:   Active Problems:   SAH (subarachnoid hemorrhage) (HCC)   Acute subarachnoid hemorrhage - Hold home plavix, aspirin x 1 week (8/4) then recheck CT - Neurosurgery following, no surgical need at this time - felt SAH likely due to pial collateral rupture in the setting of occlusion/stenosis - Keep SBP 130-160 mmHg per neuro considering stroke/bleed.  - Resume home atorvastatin   Bilateral carotid stenosis  -Defer to neurosurgery/neurology -Patient indicates she is pending further evaluation and imaging per neurology prior to discharge for outpatient vascular follow-up and intervention  Hypertensive crisis, resolving - Clevidipine off since 1900 on 7/27 - Resume home  carvedilol - olmesartan-HCTZ with discontinued by neurology with new SBP goal of 130-160  Hypothyroid - No home medication - TSH WNL   DVT prophylaxis: Early ambulation, SCDs Code Status: Full Family Communication: None present  Status is: Inpatient  Dispo: The patient is from: Home              Anticipated d/c is to: Home              Anticipated d/c date is: 24 to 48 hours              Patient currently not medically stable for discharge due to ongoing imaging and work-up per neurology  Consultants:   Neurology, neurosurgery, PCCM  Procedures:  CT head 7/27 > Subarachnoid hemorrhage with subtle edema in the right superior frontal lobe anteriorly. CTA head/neck 7/27 > Right ICA occlusion in the neck with intracranial reconstitution. No branch occlusion, vascular malformation, aneurysm, or visible vasculitis to correlate with the subarachnoid hemorrhage. 75% left proximal ICA stenosis. CT venogram 7/27 > No evidence of venous thrombosis.  MRI/A Brain/neck 7/27 > Two subcentimeter foci of acute infarction in the right deep insula, 1 affecting the cortex in the other affecting the external capsule region. Subarachnoid hemorrhage within the sulci on the right.   Antimicrobials:  None indicated  Subjective: No acute issues or events overnight denies nausea, vomiting, diarrhea, constipation, headache, fevers, chills.  Objective: Vitals:   08/08/19 2337 08/09/19 0327 08/09/19 0439 08/09/19 0744  BP: (!) 142/84 (!) 152/84  (!) 129/75  Pulse: 72 79  67  Resp: 17 18  16   Temp: 98.5 F (36.9 C) 99.5 F (37.5 C)  98.6 F (37 C)  TempSrc: Oral Oral  Oral  SpO2:  100%  100%  Weight:   88.3 kg   Height:        Intake/Output Summary (Last 24 hours) at 08/09/2019 0758 Last data filed at 08/08/2019 1000 Gross per 24 hour  Intake 100.08 ml  Output --  Net 100.08 ml   Filed Weights   08/07/19 0729 08/09/19 0439  Weight: 87.1 kg 88.3 kg    Examination:  General exam:  Appears calm and comfortable  Respiratory system: Clear to auscultation. Respiratory effort normal. Cardiovascular system: S1 & S2 heard, RRR. No JVD, murmurs, rubs, gallops or clicks. No pedal edema. Gastrointestinal system: Abdomen is nondistended, soft and nontender. No organomegaly or masses felt. Normal bowel sounds heard. Central nervous system: Alert and oriented. No focal neurological deficits. Extremities: Symmetric 5 x 5 power. Skin: No rashes, lesions or ulcers Psychiatry: Judgement and insight appear normal. Mood & affect appropriate.     Data Reviewed: I have personally reviewed following labs and imaging studies  CBC: Recent Labs  Lab 08/07/19 0433 08/08/19 0350 08/09/19 0224  WBC 9.7 8.6 10.2  HGB 10.7* 9.9* 9.8*  HCT 35.0* 31.4* 30.9*  MCV 93.8 92.9 92.5  PLT 321 297 340   Basic Metabolic Panel: Recent Labs  Lab 08/07/19 0433 08/08/19 0350 08/09/19 0224  NA 140 139 140  K 4.0 3.5 3.7  CL 108 107 107  CO2 GLUCOSE 117* 108* 105*  BUN CREATININE 0.95 0.86 0.83  CALCIUM 9.2 8.6* 9.0  MG  --  2.0 2.0  PHOS  --  3.9 3.9   GFR: Estimated Creatinine Clearance: 73.7 mL/min (by C-G formula based on SCr of 0.83 mg/dL). Liver Function Tests: Recent Labs  Lab 08/07/19 0433  AST 21  ALT 21  ALKPHOS 63  BILITOT 0.7  PROT 6.8  ALBUMIN 3.7   No results for input(s): LIPASE, AMYLASE in the last 168 hours. No results for input(s): AMMONIA in the last 168 hours. Coagulation Profile: Recent Labs  Lab 08/07/19 1122  INR 1.1   Cardiac Enzymes: No results for input(s): CKTOTAL, CKMB, CKMBINDEX, TROPONINI in the last 168 hours. BNP (last 3 results) No results for input(s): PROBNP in the last 8760 hours. HbA1C: Recent Labs    08/09/19 0224  HGBA1C 6.3*   CBG: Recent Labs  Lab 08/07/19 0444  GLUCAP 100*   Lipid Profile: Recent Labs    08/09/19 0224  CHOL 115  HDL 37*  LDLCALC 68  TRIG 50  CHOLHDL 3.1   Thyroid Function  Tests: Recent Labs    08/07/19 1122  TSH 1.871   Anemia Panel: No results for input(s): VITAMINB12, FOLATE, FERRITIN, TIBC, IRON, RETICCTPCT in the last 72 hours. Sepsis Labs: No results for input(s): PROCALCITON, LATICACIDVEN in the last 168 hours.  Recent Results (from the past 240 hour(s))  SARS Coronavirus 2 by RT PCR (hospital order, performed in [facility] hospital lab) Nasopharyngeal     Status: None   Collection Time: 08/07/19  6:38 AM   Specimen: Nasopharyngeal  Result Value Ref Range Status   SARS Coronavirus 2 NEGATIVE NEGATIVE Final    Comment: (NOTE) SARS-CoV-2 target nucleic acids are NOT DETECTED.  The SARS-CoV-2 RNA is generally detectable in upper and lower respiratory specimens during the acute phase of infection. The lowest concentration of SARS-CoV-2 viral copies this assay can detect is 250 copies / mL. A negative result does not preclude  SARS-CoV-2 infection and should not be used as the sole basis for treatment or other patient management decisions.  A negative result may occur with improper specimen collection / handling, submission of specimen other than nasopharyngeal swab, presence of viral mutation(s) within the areas targeted by this assay, and inadequate number of viral copies (<250 copies / mL). A negative result must be combined with clinical observations, patient history, and epidemiological information.  Fact Sheet for Patients:   BoilerBrush.com.cy  Fact Sheet for Healthcare Providers: https://pope.com/  This test is not yet approved or  cleared by the [place] FDA and has been authorized for detection and/or diagnosis of SARS-CoV-2 by FDA under an Emergency Use Authorization (EUA).  This EUA will remain in effect (meaning this test can be used) for the duration of the COVID-19 declaration under Section 564(b)(1) of the Act, 21 U.S.C. section 360bbb-3(b)(1), unless the authorization is  terminated or revoked sooner.  Performed at [facility], [address], [place], [place] [address]   MRSA PCR Screening     Status: None   Collection Time: 08/07/19 12:39 PM   Specimen: Nasal Mucosa; Nasopharyngeal  Result Value Ref Range Status   MRSA by PCR NEGATIVE NEGATIVE Final    Comment:        The GeneXpert MRSA Assay (FDA approved for NASAL specimens only), is one component of a comprehensive MRSA colonization surveillance program. It is not intended to diagnose MRSA infection nor to guide or monitor treatment for MRSA infections. Performed at [facility], [address] [place], [place] [address]          Radiology Studies: MR BRAIN WO CONTRAST  Result Date: 08/07/2019 CLINICAL DATA:  Right frontal headache 3 days ago. Left-sided weakness. Right ICA occlusion. Left ICA stenosis. EXAM: MRI HEAD WITHOUT CONTRAST TECHNIQUE: Multiplanar, multiecho pulse sequences of the brain and surrounding structures were obtained without intravenous contrast. COMPARISON:  CT studies same day.  MRI 08/18/2018 FINDINGS: Brain: Diffusion imaging shows 2 adjacent subcentimeter foci of acute infarction in the deep insula on the right. Involvement of the cortex and external capsule. No other acute infarction. No mass lesion, hemorrhage, hydrocephalus or extra-axial collection. Subarachnoid hemorrhage is evident within the sulci of the right frontal and parietal region. No sign of intraparenchymal hemorrhage. Brain does not show a pattern of old ischemic changes. No hydrocephalus. No extra-axial collection. Vascular: Major vessels at the base of the brain show flow. Skull and upper cervical spine: Negative Sinuses/Orbits: Clear/normal Other: None IMPRESSION: Two subcentimeter foci of acute infarction in the right deep insula, 1 affecting the cortex in the other affecting the external capsule region. Subarachnoid hemorrhage within the sulci on the right as seen by previous CT.  Electronically Signed   By: Paulina Fusi M.D.   On: 08/07/2019 10:14   ECHOCARDIOGRAM COMPLETE  Result Date: 08/08/2019    ECHOCARDIOGRAM REPORT   Patient Name:   [name] Date of Exam: 08/08/2019 Medical Rec #:  [id number]        Height:       61.0 in Accession #:    [id number]       Weight:       192.0 lb Date of Birth:  [birth year]        BSA:          1.856 m Patient Age:    59 years         BP:  156/81 mmHg Patient Gender: F                HR:           73 bpm. Exam Location:  Inpatient Procedure: 2D Echo Indications:    stroke 434.91  History:        Patient has prior history of Echocardiogram examinations, most                 recent 07/13/2016. CAD; Risk Factors:Hypertension and                 Dyslipidemia.  Sonographer:    Celene SkeenVijay Shankar RDCS (AE) Referring Phys: [id number] Marvel PlanJINDONG XU  Sonographer Comments: Image acquisition challenging due to patient body habitus. IMPRESSIONS  1. Left ventricular ejection fraction, by estimation, is 60 to 65%. The left ventricle has normal function. The left ventricle has no regional wall motion abnormalities. Left ventricular diastolic parameters are consistent with Grade I diastolic dysfunction (impaired relaxation).  2. Right ventricular systolic function is normal. The right ventricular size is normal. There is moderately elevated pulmonary artery systolic pressure.  3. The mitral valve is normal in structure. Trivial mitral valve regurgitation. No evidence of mitral stenosis.  4. The aortic valve is normal in structure. Aortic valve regurgitation is mild. No aortic stenosis is present.  5. The inferior vena cava is dilated in size with >50% respiratory variability, suggesting right atrial pressure of 8 mmHg. FINDINGS  Left Ventricle: Left ventricular ejection fraction, by estimation, is 60 to 65%. The left ventricle has normal function. The left ventricle has no regional wall motion abnormalities. The left ventricular internal cavity size was normal in  size. There is  no left ventricular hypertrophy. Left ventricular diastolic parameters are consistent with Grade I diastolic dysfunction (impaired relaxation). Normal left ventricular filling pressure. Right Ventricle: The right ventricular size is normal. No increase in right ventricular wall thickness. Right ventricular systolic function is normal. There is moderately elevated pulmonary artery systolic pressure. The tricuspid regurgitant velocity is 3.08 m/s, and with an assumed right atrial pressure of 8 mmHg, the estimated right ventricular systolic pressure is 45.9 mmHg. Left Atrium: Left atrial size was normal in size. Right Atrium: Right atrial size was normal in size. Pericardium: There is no evidence of pericardial effusion. Mitral Valve: The mitral valve is normal in structure. Normal mobility of the mitral valve leaflets. Trivial mitral valve regurgitation. No evidence of mitral valve stenosis. Tricuspid Valve: The tricuspid valve is normal in structure. Tricuspid valve regurgitation is trivial. No evidence of tricuspid stenosis. Aortic Valve: The aortic valve is normal in structure. Aortic valve regurgitation is mild. No aortic stenosis is present. Pulmonic Valve: The pulmonic valve was normal in structure. Pulmonic valve regurgitation is not visualized. No evidence of pulmonic stenosis. Aorta: The aortic root is normal in size and structure. Venous: The inferior vena cava is dilated in size with greater than 50% respiratory variability, suggesting right atrial pressure of 8 mmHg. IAS/Shunts: No atrial level shunt detected by color flow Doppler.  LEFT VENTRICLE PLAX 2D LVIDd:         4.10 cm  Diastology LVIDs:         2.40 cm  LV e' lateral:   7.07 cm/s LV PW:         1.20 cm  LV E/e' lateral: 8.5 LV IVS:        0.90 cm  LV e' medial:    7.29 cm/s LVOT diam:  1.70 cm  LV E/e' medial:  8.2 LV SV:         37 LV SV Index:   20 LVOT Area:     2.27 cm  LEFT ATRIUM           Index LA diam:      3.70 cm  1.99 cm/m LA Vol (A2C): 28.5 ml 15.35 ml/m  AORTIC VALVE LVOT Vmax:   81.60 cm/s LVOT Vmean:  66.600 cm/s LVOT VTI:    0.163 m  AORTA Ao Root diam: 2.40 cm MITRAL VALVE               TRICUSPID VALVE MV Area (PHT): 3.89 cm    TR Peak grad:   37.9 mmHg MV Decel Time: 195 msec    TR Vmax:        308.00 cm/s MV E velocity: 60.00 cm/s MV A velocity: 63.40 cm/s  SHUNTS MV E/A ratio:  0.95        Systemic VTI:  0.16 m                            Systemic Diam: 1.70 cm Chilton Si MD Electronically signed by Chilton Si MD Signature Date/Time: 08/08/2019/4:35:50 PM    Final     Scheduled Meds: . atorvastatin  80 mg Oral q1800  . carvedilol  6.25 mg Oral BID WC  . Chlorhexidine Gluconate Cloth  6 each Topical Daily  . fluticasone  1 spray Each Nare Daily   Continuous Infusions:   LOS: 2 days   Time spent:  Azucena Fallen, DO [facility]  If 7PM-7AM, please contact night-coverage [email or web address]  08/09/2019, 7:58 AM

## 2019-08-09 NOTE — Progress Notes (Signed)
STROKE TEAM PROGRESS NOTE   INTERVAL HISTORY Pt sitting in bed for lunch, no complains today. Neuro stable, plan cerebral angiogram tomorrow with Dr. Conchita Paris.    Vitals:   08/09/19 0439 08/09/19 0744 08/09/19 1142 08/09/19 1519  BP:  (!) 129/75 (!) 137/84 (!) 115/62  Pulse:  67 70 77  Resp:  16 17 16   Temp:  98.6 F (37 C) 98.7 F (37.1 C) 98.8 F (37.1 C)  TempSrc:  Oral Oral Oral  SpO2:  100% 100% 100%  Weight: 88.3 kg     Height:       CBC:  Recent Labs  Lab 08/08/19 0350 08/09/19 0224  WBC 8.6 10.2  HGB 9.9* 9.8*  HCT 31.4* 30.9*  MCV 92.9 92.5  PLT 297 340   Basic Metabolic Panel:  Recent Labs  Lab 08/08/19 0350 08/09/19 0224  NA 139 140  K 3.5 3.7  CL 107 107  CO2 25 26  GLUCOSE 108* 105*  BUN 15 13  CREATININE 0.86 0.83  CALCIUM 8.6* 9.0  MG 2.0 2.0  PHOS 3.9 3.9   Lipid Panel:  Recent Labs  Lab 08/09/19 0224  CHOL 115  TRIG 50  HDL 37*  CHOLHDL 3.1  VLDL 10  LDLCALC 68   HgbA1c:  Recent Labs  Lab 08/09/19 0224  HGBA1C 6.3*   Urine Drug Screen:  Recent Labs  Lab 08/07/19 0654  LABOPIA NONE DETECTED  COCAINSCRNUR NONE DETECTED  LABBENZ NONE DETECTED  AMPHETMU NONE DETECTED  THCU NONE DETECTED  LABBARB NONE DETECTED    Alcohol Level No results for input(s): ETH in the last 168 hours.  IMAGING past 24 hours No results found.  PHYSICAL EXAM    Temp:  [98.3 F (36.8 C)-99.5 F (37.5 C)] 98.8 F (37.1 C) (07/29 1519) Pulse Rate:  [67-79] 77 (07/29 1519) Resp:  [16-24] 16 (07/29 1519) BP: (115-152)/(62-91) 115/62 (07/29 1519) SpO2:  [96 %-100 %] 100 % (07/29 1519) Weight:  [88.3 kg] 88.3 kg (07/29 0439)  General - Well nourished, well developed, in no apparent distress.  Ophthalmologic - fundi not visualized due to noncooperation.  Cardiovascular - Regular rhythm and rate.  Mental Status -  Level of arousal and orientation to time, place, and person were intact. Language including expression, naming, repetition,  comprehension was assessed and found intact. Fund of Knowledge was assessed and was intact.  Cranial Nerves II - XII - II - Visual field intact OU. III, IV, VI - Extraocular movements intact. V - Facial sensation intact bilaterally. VII - Facial movement intact bilaterally. VIII - Hearing & vestibular intact bilaterally. X - Palate elevates symmetrically. XI - Chin turning & shoulder shrug intact bilaterally. XII - Tongue protrusion intact.  Motor Strength - The patient's strength was normal in all extremities and pronator drift was absent.  Bulk was normal and fasciculations were absent.   Motor Tone - Muscle tone was assessed at the neck and appendages and was normal.  Reflexes - The patient's reflexes were symmetrical in all extremities and she had no pathological reflexes.  Sensory - Light touch, temperature/pinprick were assessed and were symmetrical.    Coordination - The patient had normal movements in the hands and feet with no ataxia or dysmetria.  Tremor was absent.  Gait and Station - deferred.   ASSESSMENT/PLAN [name] is a 59 y.o. female with history of CAD s/p stent 1 yrs ago, carotid atherosclerotic disease, complicated migraine, HTN, hypothyroidism, hypercholesterolemia, arthritis, chronic lower back pain,  depression and vitamin D deficiency who had transient tachycardia, R sided HA, L facial droop and L sided weakness following by transient L foot tingling later than afternoon. Now presenting with R retro-orbital HA and L sided weakness in settin go f HTN.   Stroke: 2 tiny R insular infarcts likely due to R ICA occlusion  Left convexity small SAH due to pial collateral rupture in the setting of R ICA occlusion and left ICA high grade stenosis. DDx including RCVS, occult aneurysm, and CSVT.   CT head SAH R superior frontal lobe. Suspect early hemorrhagic infarct frontal lobe. AV calcification.   CTA head & neck R ICA occlusion in neck w/ intracranial  reconstitution. L proximal ICA 75% stenosis. No aneurysm.   CT venogram negative for CSVT  MRI  2 subcentimeter deep R insular infarcts (1 cortical, 1 external capsule).  R SAH.  2D Echo EF 60-65%. No source of embolus   LDL 68  HgbA1c 6.3  VTE prophylaxis - SCDs   aspirin 81 mg daily and clopidogrel 75 mg daily prior to admission, now on No antithrombotic. Plan to hold DAPT x 1 week then recheck CT. Based on results, will consider resumption.   Therapy recommendations:  pending   Disposition:  pending   Bilateral Carotid Stenosis  MRA neck Aug 2020 R ICA 80%, L 50%  CUS 06/2019 right ICA 80 to 90% stenosis, left ICA 40 to 59% stenosis  This admission, CTA head & neck R ICA occlusion in neck w/ intracranial reconstitution. L proximal ICA 75% stenosis.  Convexity SAH likely due to frail pial collateral rupture in the setting of hypoperfusion induced vascular dilatation. (please see also http://www.ajnr.org/content/32/3/E51 and http://lyons.com/)  Discussed with Dr. Conchita Paris, will do cerebral angiogram in am to evaluate carotid status.     May need CAS on the left if right ICA has occluded  Hypertensive Urgency  Treated w/ cleviprex, now off . SBP goal 130-160 now . nimotop stopped as non-aneurysmal . BP stable . Continue coreg  Hyperlipidemia  Home meds:  lipitor 80 & zetia 10, statin resumed in hospital  LDL 68, goal < 70  Continue statin and zetia at discharge  Other Stroke Risk Factors  ETOH use, advised to drink no more than 2 drink(s) a day  Obesity, Body mass index is 36.78 kg/m., recommend weight loss, diet and exercise as appropriate   Family hx stroke (mother)  Coronary artery disease s/p stent 1 yr ago on DAPT, continued > 1 yrs given severe ICA stenosis    Hx HA w/ migraine features, followed by Dr. Terrace Arabia at [facility]. On topamax, [place]ia prn   Anemia, Hg 10.7->9.9->9.8  Other Active Problems  LBP  hypothyroid    Hospital day # 2  Marvel Plan, MD PhD Stroke Neurology 08/09/2019 3:21 PM    To contact Stroke Continuity provider, please refer to [email or web address]. After hours, contact General Neurology

## 2019-08-10 ENCOUNTER — Inpatient Hospital Stay (HOSPITAL_COMMUNITY): Payer: 59

## 2019-08-10 ENCOUNTER — Encounter (HOSPITAL_COMMUNITY): Payer: Self-pay | Admitting: Internal Medicine

## 2019-08-10 HISTORY — PX: IR US GUIDE VASC ACCESS RIGHT: IMG2390

## 2019-08-10 HISTORY — PX: IR ANGIO INTRA EXTRACRAN SEL COM CAROTID INNOMINATE BILAT MOD SED: IMG5360

## 2019-08-10 HISTORY — PX: IR ANGIO VERTEBRAL SEL VERTEBRAL BILAT MOD SED: IMG5369

## 2019-08-10 LAB — CBC
HCT: 34 % — ABNORMAL LOW (ref 36.0–46.0)
Hemoglobin: 10.7 g/dL — ABNORMAL LOW (ref 12.0–15.0)
MCH: 29.4 pg (ref 26.0–34.0)
MCHC: 31.5 g/dL (ref 30.0–36.0)
MCV: 93.4 fL (ref 80.0–100.0)
Platelets: 317 10*3/uL (ref 150–400)
RBC: 3.64 MIL/uL — ABNORMAL LOW (ref 3.87–5.11)
RDW: 13.8 % (ref 11.5–15.5)
WBC: 9.7 10*3/uL (ref 4.0–10.5)
nRBC: 0 % (ref 0.0–0.2)

## 2019-08-10 LAB — BASIC METABOLIC PANEL
Anion gap: 7 (ref 5–15)
BUN: 12 mg/dL (ref 6–20)
CO2: 28 mmol/L (ref 22–32)
Calcium: 9.4 mg/dL (ref 8.9–10.3)
Chloride: 105 mmol/L (ref 98–111)
Creatinine, Ser: 0.86 mg/dL (ref 0.44–1.00)
GFR calc Af Amer: >60 mL/min (ref >60)
GFR calc non Af Amer: >60 mL/min (ref >60)
Glucose, Bld: 117 mg/dL — ABNORMAL HIGH (ref 70–99)
Potassium: 3.7 mmol/L (ref 3.5–5.1)
Sodium: 140 mmol/L (ref 135–145)

## 2019-08-10 IMAGING — XA IR CAROTID INTERNAL HEAD/NECK BILAT  (MS)
11 of 17 series · 11 of 24 positions shown · IV contrast (IODINE)
Comparison: CT angiogram of the head and neck [DATE].

CLINICAL DATA: Right-sided headaches with left-sided weakness. CT
of the brain demonstrating right posterior frontal convexity
subarachnoid hemorrhage. MRI revealing punctate right peri insular
subcortical ischemic changes. CTA of the head and neck demonstrating
occluded right internal carotid artery.

EXAM:
IR ANGIO INTRA EXTRACRAN SEL INTERNAL CAROTID BILAT MOD SED; IR
ANGIO VERTEBRAL SEL VERTEBRAL BILAT MOD SED; IR ULTRASOUND GUIDANCE
VASC ACCESS RIGHT
TECHNIQUE: Informed written consent was obtained from the patient after a
thorough discussion of the procedural risks, benefits and
alternatives. All questions were addressed. Maximal Sterile Barrier
Technique was utilized including caps, mask, sterile gowns, sterile
gloves, sterile drape, hand hygiene and skin antiseptic. A timeout
was performed prior to the initiation of the procedure.

[Series 1: cerebral · 2 acquisitions, 1 frame shown (1 of 11)]
[im 1/2]
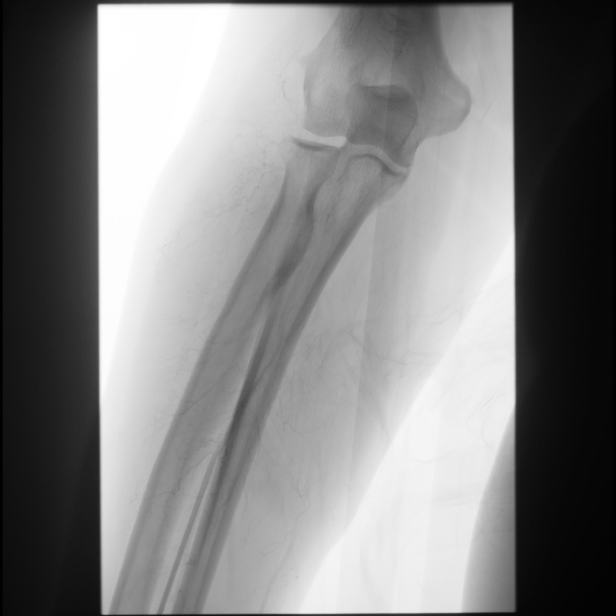

[Series 3: cerebral · 4 acquisitions, 1 frame shown (2 of 11)]
[im 1/4]
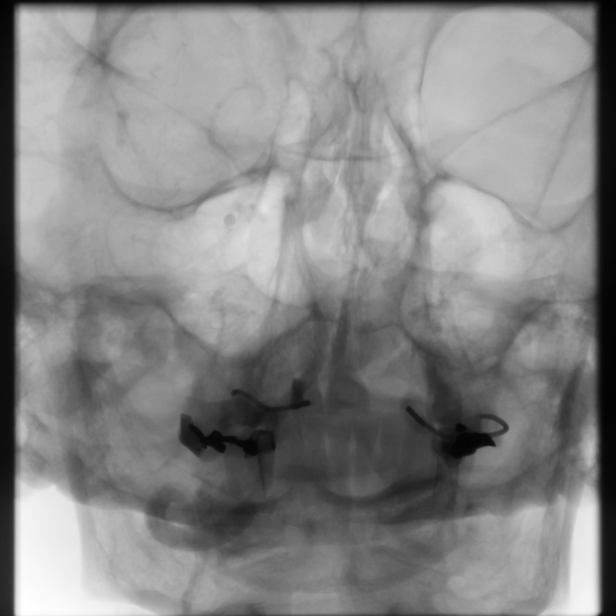

[Series 4: cerebral · 4 acquisitions, 1 frame shown (3 of 11)]
[im 1/4]
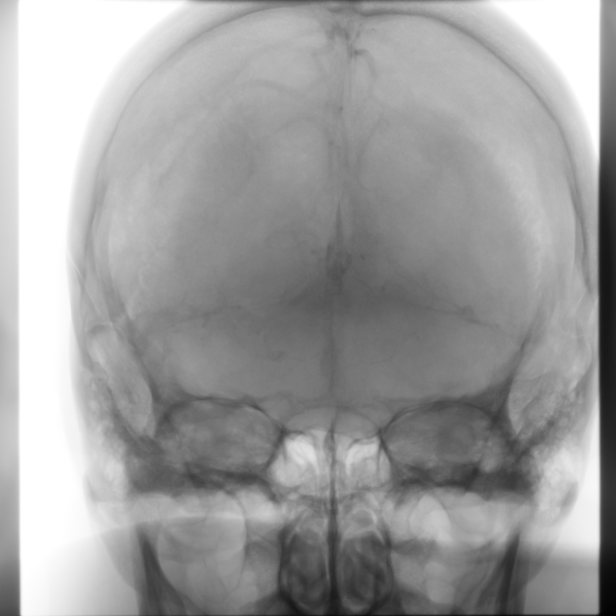

[Series 6: cerebral · 2 acquisitions, 1 frame shown (4 of 11)]
[im 1/2]
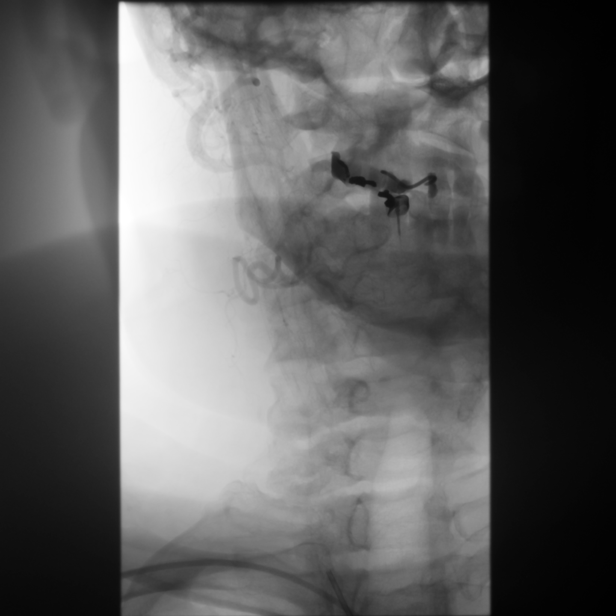

[Series 8: cerebral · 4 acquisitions, 1 frame shown (5 of 11)]
[im 1/4]
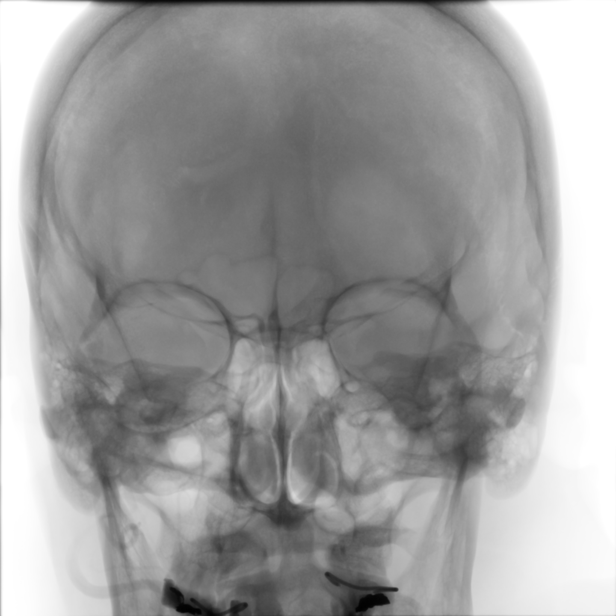

[Series 10: cerebral · 4 acquisitions, 1 frame shown (6 of 11)]
[im 1/4]
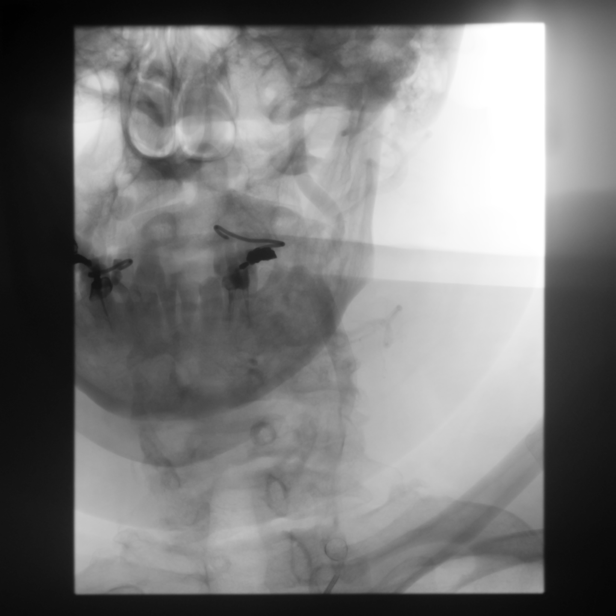

[Series 11: cerebral · 1 of 6 slices shown (7 of 11)]
[im 6/6]
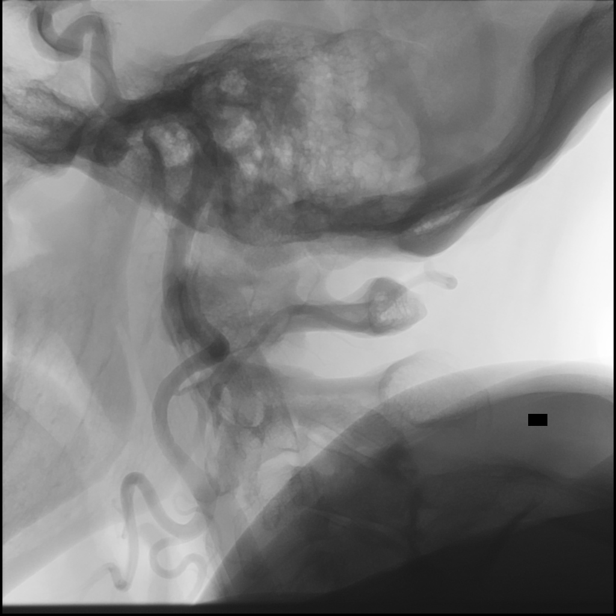

[Series 13: cerebral · 4 acquisitions, 1 frame shown (8 of 11)]
[im 1/4]
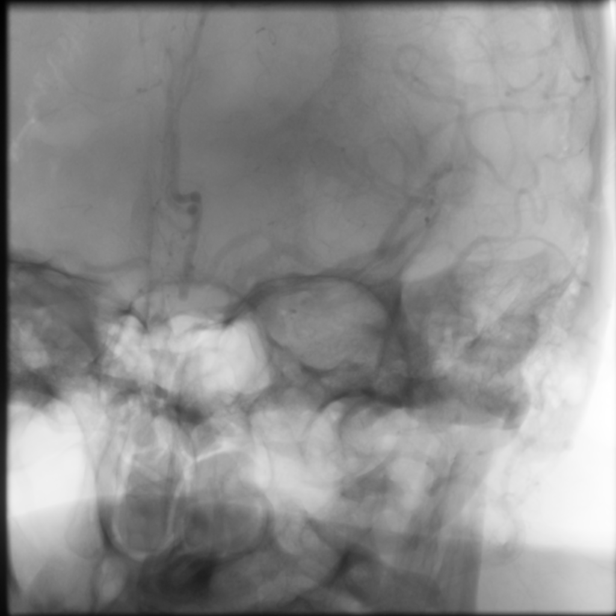

[Series 14: cerebral · 4 acquisitions, 1 frame shown (9 of 11)]
[im 1/4]
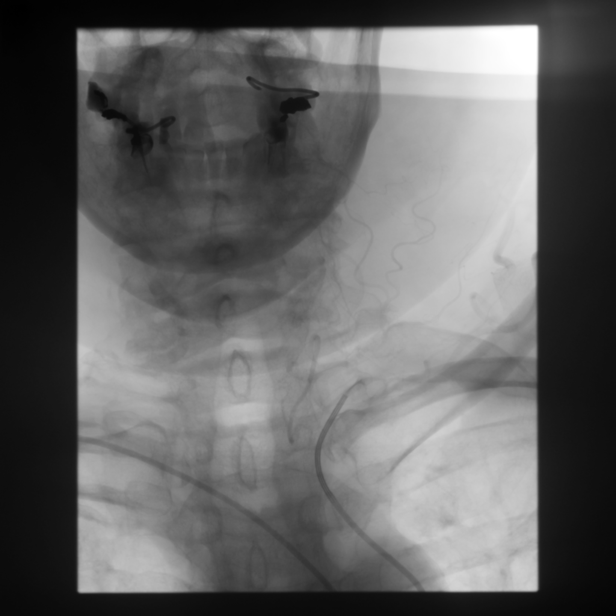

[Series 15: cerebral · 4 acquisitions, 1 frame shown (10 of 11)]
[im 1/4]
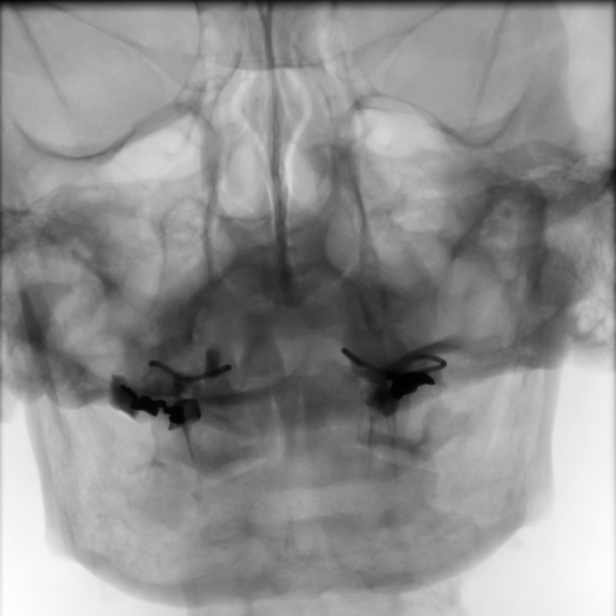

[Series 17: cerebral · 2 acquisitions, 1 frame shown (11 of 11)]
[im 1/2]
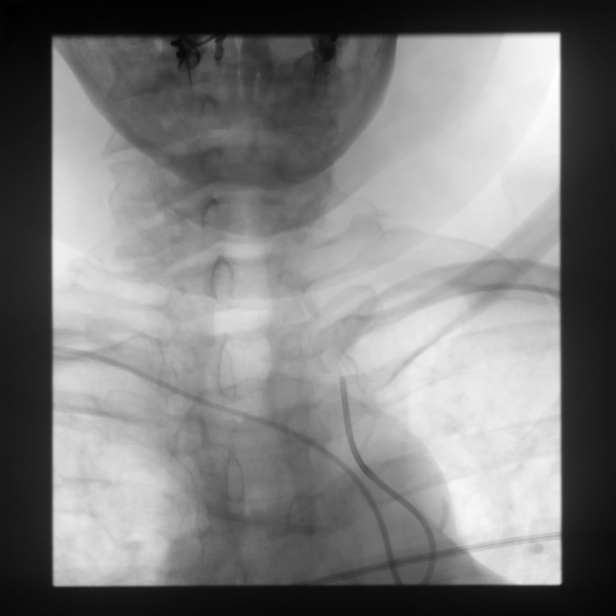

[11 of 24 positions shown; findings below may reference images not displayed]

MEDICATIONS:
Heparin [OY] units IV. No antibiotic was administered within 1 hour
of the procedure.

ANESTHESIA/SEDATION:
Versed 1 mg IV; Fentanyl 50 mcg IV

Moderate Sedation Time:  40 minutes

The patient was continuously monitored during the procedure by the
interventional radiology nurse under my direct supervision.

CONTRAST:  Isovue 300 approximately 60 mL

FLUOROSCOPY TIME:  Fluoroscopy Time: 11 minutes 0 seconds ([OY]
mGy).

COMPLICATIONS:
None immediate.
The right forearm to the wrist was prepped and draped in the usual
sterile manner. The right radial artery was identified with
ultrasound and its morphology documented. A dorsal palmar
anastomosis was then verified to be present. Using ultrasound
guidance and a micropuncture set, access into the right radial
artery was obtained over a 0.018 inch micro guidewire. Over this, a
[DATE] French radial sheath was then inserted. The micro guidewire, and
obturator were removed. Good aspiration was obtained from the side
port of the sheath. A cocktail of [OY] units of heparin, 2.5 mg of
verapamil, and 200 mcg of nitroglycerin was then infused in diluted
form through the sheath without event.

A 5 FERIENHAUS 2 diagnostic catheter was then advanced over a
0.035 inch Roadrunner guidewire to the aortic arch region. Selective
arteriograms were then performed of the right vertebral artery, the
right common carotid artery, the left common carotid artery and the
left vertebral artery.

Following the procedure, hemostasis was achieved at the right radial
puncture site with a wrist band. Distal right radial pulse was
verified to be present.
FINDINGS: The right vertebral artery origin is widely patent.

The vessel is seen to opacify to the cranial skull base. Wide
patency is seen of the right vertebrobasilar junction and the right
posterior-inferior cerebellar artery.

The basilar artery, the posterior cerebral arteries, the superior
cerebellar arteries and the anterior-inferior cerebellar arteries
opacify into the capillary and venous phases. There is prompt
opacification via the right posterior communicating artery of the
right internal carotid artery supraclinoid segment, and the right
middle cerebral artery distribution.

Unopacified blood is seen in the basilar artery from the
contralateral vertebral artery.

The right common carotid arteriogram demonstrates the right external
carotid artery branches to be widely patent. The right internal
carotid artery at the bulb demonstrates complete occlusion without
angiographic evidence of a string sign extracranially.

More distally there is reconstitution of the right internal carotid
artery cavernous segment extending into the supraclinoid segment
from nasolacrimal branches of the right external carotid artery via
ipsilateral ophthalmic artery.

The left common carotid arteriogram demonstrates the left external
carotid artery and its major branches to be widely patent.

The left internal carotid artery at the bulb demonstrates
approximately 30% stenosis by the NASCET criteria. No evidence of
acute ulcerations or of intraluminal filling defects is seen.

Distally patency is maintained of the left internal carotid artery
at the cervical petrous junction.

The cavernous and the supraclinoid segments are widely patent.

The left middle cerebral artery and the left anterior cerebral
artery opacify into the capillary and venous phases.

Cross-filling via the anterior communicating artery of the right
anterior cerebral artery A2 segment and distally is seen.

The left vertebral artery origin is widely patent. Moderate
tortuosity is seen just distal to its origin.

More distally the vessel is seen to opacify to the cranial skull
base. Wide patency is seen of the left posterior-inferior cerebellar
artery and the left vertebrobasilar junction.

The opacified portion of the basilar artery, the posterior cerebral
arteries, the superior cerebellar arteries and the anterior-inferior
cerebellar arteries is seen into the capillary and venous phases.

Again demonstrated is prompt retrograde opacification via the right
posterior communicating artery of the right internal carotid artery
supraclinoid segments and distally the right middle cerebral artery
distribution as mentioned earlier.
IMPRESSION: Angiographically occluded right internal carotid artery at the bulb
without evidence of an angiographic string sign proximally.

Distal partial reconstitution of the right internal carotid artery
cavernous and supraclinoid segment via the ipsilateral ophthalmic
artery from the right external carotid artery branches.

Prompt retrograde opacification via the right posterior
communicating artery of the distal right internal carotid artery and
the right middle cerebral artery from the posterior circulation as
described.

Approximately 30% stenosis of the left internal carotid artery at
the bulb by the NASCET criteria. No evidence of acute ulcerations or
of intraluminal filling defects is seen.

No gross evidence of vascular malformations, or of aneurysms or of
dissection or of arteriovenous shunting.

PLAN:
Findings reviewed with the patient and referring neurologist.

Recommend follow-up CT angiogram of the head and neck in 6 months
time.

## 2019-08-10 MED ORDER — FENTANYL CITRATE (PF) 100 MCG/2ML IJ SOLN
INTRAMUSCULAR | Status: AC
  Filled 2019-08-10: qty 2

## 2019-08-10 MED ORDER — HEPARIN SODIUM (PORCINE) 1000 UNIT/ML IJ SOLN
INTRAMUSCULAR | Status: AC | PRN
  Administered 2019-08-10: 2000 [IU] via INTRA_ARTERIAL

## 2019-08-10 MED ORDER — VERAPAMIL HCL 2.5 MG/ML IV SOLN
INTRAVENOUS | Status: AC
  Filled 2019-08-10: qty 2

## 2019-08-10 MED ORDER — MIDAZOLAM HCL 2 MG/2ML IJ SOLN
INTRAMUSCULAR | Status: AC | PRN
  Administered 2019-08-10: 1 mg via INTRAVENOUS

## 2019-08-10 MED ORDER — HEPARIN SODIUM (PORCINE) 1000 UNIT/ML IJ SOLN
INTRAMUSCULAR | Status: AC
  Filled 2019-08-10: qty 1

## 2019-08-10 MED ORDER — LIDOCAINE HCL 1 % IJ SOLN
INTRAMUSCULAR | Status: AC
  Filled 2019-08-10: qty 20

## 2019-08-10 MED ORDER — FENTANYL CITRATE (PF) 100 MCG/2ML IJ SOLN
INTRAMUSCULAR | Status: AC | PRN
  Administered 2019-08-10 (×2): 25 ug via INTRAVENOUS

## 2019-08-10 MED ORDER — SODIUM CHLORIDE 0.9 % IV SOLN: INTRAVENOUS | Status: DC

## 2019-08-10 MED ORDER — IOHEXOL 300 MG/ML  SOLN
150.0000 mL | Freq: Once | INTRAMUSCULAR | Status: AC | PRN
  Administered 2019-08-10: 75 mL via INTRA_ARTERIAL

## 2019-08-10 MED ORDER — SODIUM CHLORIDE (PF) 0.9 % IJ SOLN
INTRAVENOUS | Status: AC | PRN
  Administered 2019-08-10 (×2): 200 ug via INTRA_ARTERIAL

## 2019-08-10 MED ORDER — MIDAZOLAM HCL 2 MG/2ML IJ SOLN
INTRAMUSCULAR | Status: AC
  Filled 2019-08-10: qty 2

## 2019-08-10 MED ORDER — VERAPAMIL HCL 2.5 MG/ML IV SOLN
INTRAVENOUS | Status: AC | PRN
  Administered 2019-08-10: 2.5 mg via INTRAVENOUS

## 2019-08-10 MED ORDER — HYDRALAZINE HCL 20 MG/ML IJ SOLN
INTRAMUSCULAR | Status: AC
  Filled 2019-08-10: qty 1

## 2019-08-10 MED ORDER — NITROGLYCERIN 1 MG/10 ML FOR IR/CATH LAB
INTRA_ARTERIAL | Status: AC
  Filled 2019-08-10: qty 10

## 2019-08-10 NOTE — Progress Notes (Signed)
OT Cancellation Note  Patient Details Name: [name] MRN: [id number] DOB: 03-25-1960   Cancelled Treatment:    Reason Eval/Treat Not Completed: OT screened, no needs identified, will sign off. Pt reports feeling back to baseline and not needing any services or equipment upon dc.   Joell Buerger/OTS Stevey Stapleton 08/10/2019, 8:32 AM

## 2019-08-10 NOTE — Evaluation (Addendum)
Physical Therapy Evaluation Patient Details Name: [name] MRN: [id number] DOB: Apr 03, 1960 Today's Date: 08/10/2019   History of Present Illness  Patient is a 59 y/o female who presents with left leg heaviness/weakness and facial numbness. Head CT- right convexity SAH with edema. CTA- right ICA occlusion. MRI- 2 small punctate infartcs right insular region. Plan for cerebral angiogram 08/10/19. PMH includes HTN, depression, CAD.  Clinical Impression  Patient independent, lives alone and works for [employer]. Today, pt tolerated bed mobility, transfers and ambulation Mod I. Pt reports needing a left hip replacement so noted to have an antalgic gait however no evidence of imbalance or safety concerns. Reports all symptoms have resolved. Pt reports feeling back to baseline. Encouraged mobility while in the hospital. Does not require skilled therapy services. All education completed. Discharge from therapy.    Follow Up Recommendations No PT follow up    Equipment Recommendations  None recommended by PT    Recommendations for Other Services       Precautions / Restrictions Precautions Precautions: None Precaution Comments: SBP goal 120-150. Restrictions Weight Bearing Restrictions: No      Mobility  Bed Mobility Overal bed mobility: Modified Independent             General bed mobility comments: No assist needed.  Transfers Overall transfer level: Independent               General transfer comment: Stood from EOB wtihout difficulty, from toilet without difficulty.  Ambulation/Gait Ambulation/Gait assistance: Modified independent (Device/Increase time) Gait Distance (Feet): 300 Feet Assistive device: None Gait Pattern/deviations: Antalgic;Decreased stance time - left;Step-through pattern Gait velocity: decreased Gait velocity interpretation: 1.31 - 2.62 ft/sec, indicative of limited community ambulator General Gait Details: Slow, steady antalgic like  gait due to hx of left hip arthritis.  Stairs            Wheelchair Mobility    Modified Rankin (Stroke Patients Only) Modified Rankin (Stroke Patients Only) Pre-Morbid Rankin Score: No significant disability Modified Rankin: No significant disability     Balance Overall balance assessment: No apparent balance deficits (not formally assessed)                                           Pertinent Vitals/Pain Pain Assessment: No/denies pain    Home Living Family/patient expects to be discharged to:: Private residence Living Arrangements: Alone Available Help at Discharge: Family;Available PRN/intermittently Type of Home: House Home Access: Level entry     Home Layout: One level Home Equipment: None      Prior Function Level of Independence: Independent         Comments: Works from home for [employer]   Dominant Hand: Right    Extremity/Trunk Assessment   Upper Extremity Assessment Upper Extremity Assessment: Defer to OT evaluation    Lower Extremity Assessment Lower Extremity Assessment: LLE deficits/detail LLE Deficits / Details: Hx of arthris in left hip, supposed to have left hip replacement after addressing blood vessels    Cervical / Trunk Assessment Cervical / Trunk Assessment: Normal  Communication   Communication: No difficulties  Cognition Arousal/Alertness: Awake/alert Behavior During Therapy: WFL for tasks assessed/performed Overall Cognitive Status: Within Functional Limits for tasks assessed  General Comments General comments (skin integrity, edema, etc.): HR up to 130 bpm during activity.    Exercises     Assessment/Plan    PT Assessment Patent does not need any further PT services  PT Problem List         PT Treatment Interventions      PT Goals (Current goals can be found in the Care Plan section)  Acute Rehab PT Goals Patient  Stated Goal: to get this procedure done and go home PT Goal Formulation: All assessment and education complete, DC therapy    Frequency     Barriers to discharge        Co-evaluation               AM-PAC PT "6 Clicks" Mobility  Outcome Measure Help needed turning from your back to your side while in a flat bed without using bedrails?: None Help needed moving from lying on your back to sitting on the side of a flat bed without using bedrails?: None Help needed moving to and from a bed to a chair (including a wheelchair)?: None Help needed standing up from a chair using your arms (e.g., wheelchair or bedside chair)?: None Help needed to walk in hospital room?: None Help needed climbing 3-5 steps with a railing? : None 6 Click Score: 24    End of Session   Activity Tolerance: Patient tolerated treatment well Patient left: in bed;with call bell/phone within reach Nurse Communication: Mobility status PT Visit Diagnosis: Difficulty in walking, not elsewhere classified (R26.2)    Time: 4315-4008 PT Time Calculation (min) (ACUTE ONLY): 18 min   Charges:   PT Evaluation $PT Eval Moderate Complexity: 1 Mod          Vale Haven, PT, DPT Acute Rehabilitation Services Pager [phone] Office [phone]      Blake Divine A Jenean Escandon 08/10/2019, 11:52 AM

## 2019-08-10 NOTE — Progress Notes (Signed)
STROKE TEAM PROGRESS NOTE   INTERVAL HISTORY Pt RN at bedside. Pt sitting in bed.  She had angiogram today with Dr. Corliss Skains which showed right ICA proximal occluded with partial reconstitution from ophthalmic artery and right P-comm.  Left ICA only 30 to 40% stenosis.  A comm patent.  No intervention indicated at this time.  Vitals:   08/10/19 1158 08/10/19 1505 08/10/19 1510 08/10/19 1515  BP: (!) 158/80 (!) 178/98 (!) 172/91 (!) 175/99  Pulse: 67 74 76 66  Resp: 18 20 (!) 10 14  Temp: 98.5 F (36.9 C)     TempSrc: Oral     SpO2: 99% 100% 100% 100%  Weight:      Height:       CBC:  Recent Labs  Lab 08/09/19 0224 08/10/19 0506  WBC 10.2 9.7  HGB 9.8* 10.7*  HCT 30.9* 34.0*  MCV 92.5 93.4  PLT 340 317   Basic Metabolic Panel:  Recent Labs  Lab 08/08/19 0350 08/08/19 0350 08/09/19 0224 08/10/19 0506  NA 139   < > 140 140  K 3.5   < > 3.7 3.7  CL 107   < > 107 105  CO2 25   < > 26 28  GLUCOSE 108*   < > 105* 117*  BUN 15   < > 13 12  CREATININE 0.86   < > 0.83 0.86  CALCIUM 8.6*   < > 9.0 9.4  MG 2.0  --  2.0  --   PHOS 3.9  --  3.9  --    < > = values in this interval not displayed.   Lipid Panel:  Recent Labs  Lab 08/09/19 0224  CHOL 115  TRIG 50  HDL 37*  CHOLHDL 3.1  VLDL 10  LDLCALC 68   HgbA1c:  Recent Labs  Lab 08/09/19 0224  HGBA1C 6.3*   Urine Drug Screen:  Recent Labs  Lab 08/07/19 0654  LABOPIA NONE DETECTED  COCAINSCRNUR NONE DETECTED  LABBENZ NONE DETECTED  AMPHETMU NONE DETECTED  THCU NONE DETECTED  LABBARB NONE DETECTED    Alcohol Level No results for input(s): ETH in the last 168 hours.  IMAGING past 24 hours No results found.  PHYSICAL EXAM  Temp:  [97.7 F (36.5 C)-98.5 F (36.9 C)] 98.5 F (36.9 C) (07/30 1158) Pulse Rate:  [61-76] 66 (07/30 1515) Resp:  [10-20] 14 (07/30 1515) BP: (114-178)/(48-99) 175/99 (07/30 1515) SpO2:  [97 %-100 %] 100 % (07/30 1515)  General - Well nourished, well developed, in no  apparent distress.  Ophthalmologic - fundi not visualized due to noncooperation.  Cardiovascular - Regular rhythm and rate.  Mental Status -  Level of arousal and orientation to time, place, and person were intact. Language including expression, naming, repetition, comprehension was assessed and found intact. Fund of Knowledge was assessed and was intact.  Cranial Nerves II - XII - II - Visual field intact OU. III, IV, VI - Extraocular movements intact. V - Facial sensation intact bilaterally. VII - Facial movement intact bilaterally. VIII - Hearing & vestibular intact bilaterally. X - Palate elevates symmetrically. XI - Chin turning & shoulder shrug intact bilaterally. XII - Tongue protrusion intact.  Motor Strength - The patient's strength was normal in all extremities and pronator drift was absent.  Bulk was normal and fasciculations were absent.   Motor Tone - Muscle tone was assessed at the neck and appendages and was normal.  Reflexes - The patient's reflexes were symmetrical in all  extremities and she had no pathological reflexes.  Sensory - Light touch, temperature/pinprick were assessed and were symmetrical.    Coordination - The patient had normal movements in the hands and feet with no ataxia or dysmetria.  Tremor was absent.  Gait and Station - deferred.   ASSESSMENT/PLAN [name] is a 59 y.o. female with history of CAD s/p stent 1 yrs ago, carotid atherosclerotic disease, complicated migraine, HTN, hypothyroidism, hypercholesterolemia, arthritis, chronic lower back pain, depression and vitamin D deficiency who had transient tachycardia, R sided HA, L facial droop and L sided weakness following by transient L foot tingling later than afternoon. Now presenting with R retro-orbital HA and L sided weakness in settin go f HTN.   Stroke: 2 tiny R insular infarcts likely due to R ICA occlusion  Left convexity small SAH likely due to pial collateral rupture in  the setting of R ICA occlusion  CT head SAH R superior frontal lobe. Suspect early hemorrhagic infarct frontal lobe. AV calcification.   CTA head & neck R ICA occlusion in neck w/ intracranial reconstitution. L proximal ICA 75% stenosis. No aneurysm.   CT venogram negative for CSVT  MRI  2 subcentimeter deep R insular infarcts (1 cortical, 1 external capsule).  R SAH.  Cerebral angiogram showed right ICA occlusion, left ICA 30 to 40% stenosis.  2D Echo EF 60-65%. No source of embolus   LDL 68  HgbA1c 6.3  VTE prophylaxis - SCDs   aspirin 81 mg daily and clopidogrel 75 mg daily prior to admission, now on No antithrombotic due to [facility]. Plan to restart ASA in one week. Follow up with Dr. Pearlean Brownie at [facility] in 3-4 weeks to consider resume DAPT.   Therapy recommendations:  No therapy needs  Disposition:  Return home  Bilateral Carotid Stenosis  MRA neck Aug 2020 R ICA 80%, L 50%  CUS 06/2019 right ICA 80 to 90% stenosis, left ICA 40 to 59% stenosis  This admission, CTA head & neck R ICA occlusion in neck w/ intracranial reconstitution. L proximal ICA 75% stenosis.  Convexity SAH likely due to frail pial collateral rupture in the setting of hypoperfusion induced vascular dilatation. (please see also http://www.ajnr.org/content/32/3/E51 and http://lyons.com/)  Cerebral angiogram showed right ICA occlusion and left ICA 30 to 40% stenosis  No intervention indicated at this time.  BP goal 130-150  Continue follow-up with Dr. Myra Gianotti at vascular surgery.  Hypertensive Urgency  Treated w/ cleviprex, now off . SBP goal 130-160 now . nimotop stopped as non-aneurysmal . BP stable . Continue coreg . BP goal 130-150  Hyperlipidemia  Home meds:  lipitor 80 & zetia 10, statin resumed in hospital  LDL 68, goal < 70  Continue statin and zetia at discharge  Other Stroke Risk Factors  ETOH use, advised to drink no more than 2 drink(s) a  day  Obesity, Body mass index is 36.78 kg/m., recommend weight loss, diet and exercise as appropriate   Family hx stroke (mother)  Coronary artery disease s/p stent 1 yr ago on DAPT, continued > 1 yrs given severe ICA stenosis    Hx HA w/ migraine features, followed by Dr. Terrace Arabia at [facility]. On topamax, cambia prn   Anemia, Hg 10.7->9.9->9.8->10.7  Other Active Problems  LBP  Hypothyroid   Hospital day # 3  Neurology will sign off. Please call with questions. Pt will follow up with stroke clinic Dr. Pearlean Brownie at [facility] in about 3-4 weeks. Thanks for the consult.   Friend Dorfman  Roda Shutters, MD PhD Stroke Neurology 08/10/2019 3:20 PM    To contact Stroke Continuity provider, please refer to [email or web address]. After hours, contact General Neurology

## 2019-08-10 NOTE — TOC Transition Note (Signed)
Transition of Care [facility]) - CM/SW Discharge Note   Patient Details  Name: [name] MRN: [id number] Date of Birth: 1960-11-28  Transition of Care [facility]) CM/SW Contact:  Kermit Balo, RN Phone Number: 08/10/2019, 4:24 PM   Clinical Narrative:    Pt discharging home with self care. No f/u per PT/OT and no DME needs.  Pt has transportation home.    Final next level of care: Home/Self Care Barriers to Discharge: No Barriers Identified   Patient Goals and CMS Choice        Discharge Placement                       Discharge Plan and Services                                     Social Determinants of Health (SDOH) Interventions     Readmission Risk Interventions No flowsheet data found.

## 2019-08-10 NOTE — Sedation Documentation (Signed)
ETC02 Monitor/O2 removed per md

## 2019-08-10 NOTE — Procedures (Signed)
S/P 4 vessel cerebral arteriograms. RT rad approach. Findings. 1. Occluded RT ICA prox with partial reconstitution from ophthalmic artery from RT ECA branches.No string sign noted 2. 30 to 40 % stenosis RT ICA prox. S.Deshun Sedivy MD

## 2019-08-10 NOTE — H&P (Signed)
Chief Complaint: Patient was seen in consultation today for intra-cranial stenosis  Referring Physician(s): Dr. Marvel PlanJindong Xu  Supervising Physician: Julieanne Cottoneveshwar, Sanjeev  Patient Status: [facility] - In-pt  History of Present Illness: [name] is a 59 y.o. female with past medical history of CAD s/p cath and on Plavix at home, HTN, arthritis who [facility] with acute onset right-sided headache, left facial droop, and left-sided weakness.  She was found to have R ICA occlusion, left convexity small SAH due to partial collateral rupture in the setting of R ICA occlusion and left ICA high grade stenosis. Her symptoms have subsided and she has functionally returned to normal.  IR consulted for diagnostic angiogram at the request of Dr. Roda ShuttersXu.   Patient assessed at bedside this afternoon.  She is comfortable, but complains of ongoing headache.  Denies residual weakness or functional changes.  She has been NPO today.  She has a history of Plavix use but ran out of her medications this past weekend and her last dose was on Saturday.  She has not had Plavix since then.  She is agreeable to proceed with diagnostic angiogram today.   Past Medical History:  Diagnosis Date  . Allergy   . Arthritis    back   . Atypical chest pain 06/30/2016  . Back pain    DUE TO INJURY AT WORK ON05/2013  . Bruises easily   . Chronic lower back pain   . Coronary artery disease   . Depression    was on meds 2 yrs ago   . Headache(784.0)    rarely  . History of bronchitis    last time about 6637yrs ago   . Hypertension    takes Benicar daily  . Hypothyroidism 06/30/2016  . Pure hypercholesterolemia 05/31/2019  . Vitamin D deficiency    takes Vitamin D 2 times/wk  . Weakness    and numbness right foot    Past Surgical History:  Procedure Laterality Date  . BACK SURGERY    . CARPAL TUNNEL RELEASE Right   . CESAREAN SECTION  1988  . COLONOSCOPY    . EXPLORATORY LAPAROTOMY  1991   "took out fatty  tumor" (11/09/2012)  . LEFT HEART CATH AND CORONARY ANGIOGRAPHY N/A 08/06/2016   Procedure: Left Heart Cath and Coronary Angiography;  Surgeon: Lyn RecordsSmith, Henry W, MD;  [facility];  Service: Cardiovascular;  Laterality: N/A;  . LEFT HEART CATH AND CORONARY ANGIOGRAPHY N/A 07/12/2018   Procedure: LEFT HEART CATH AND CORONARY ANGIOGRAPHY;  Surgeon: Marykay LexHarding, David W, MD;  [facility];  Service: Cardiovascular;  Laterality: N/A;  . LUMBAR LAMINECTOMY/DECOMPRESSION MICRODISCECTOMY  11/09/2012   "L3-4" (11/09/2012)  . LUMBAR LAMINECTOMY/DECOMPRESSION MICRODISCECTOMY N/A 11/09/2012   Procedure: L3-L4 DECOMPRESSION AND MICRODISCECTOMY   (1 LEVEL);  Surgeon: Venita Lickahari Brooks, MD;  [facility];  Service: Orthopedics;  Laterality: N/A;    Allergies: Patient has no known allergies.  Medications: Prior to Admission medications   Medication Sig Start Date End Date Taking? Authorizing Provider  acetaminophen (TYLENOL) 500 MG tablet Take 1,000 mg by mouth every 6 (six) hours as needed for headache (pain).   Yes [provider]  aspirin EC 81 MG EC tablet Take 1 tablet (81 mg total) by mouth daily. 07/14/18  Yes Vann, Jessica U, DO  atorvastatin (LIPITOR) 80 MG tablet TAKE 1 TABLET (80 MG TOTAL) BY MOUTH DAILY AT 6 PM. 01/29/19  Yes Chilton Siandolph, Tiffany, MD  carvedilol (COREG) 6.25  MG tablet TAKE 1 TABLET (6.25 MG TOTAL) BY MOUTH 2 (TWO) TIMES DAILY WITH A MEAL. 12/01/18  Yes Chilton Si, MD  cholecalciferol (VITAMIN D3) 25 MCG (1000 UNIT) tablet Take 1,000 Units by mouth daily.   Yes [provider]  clopidogrel (PLAVIX) 75 MG tablet TAKE 1 TABLET (75 MG TOTAL) BY MOUTH DAILY WITH BREAKFAST. 01/29/19  Yes Chilton Si, MD  fluticasone [facility]) 50 MCG/ACT nasal spray Place 2 sprays into both nostrils daily as needed for allergies or rhinitis.   Yes [provider]  levocetirizine (XYZAL) 5 MG tablet Take 1 tablet (5 mg total) by mouth daily. 06/22/19   Yes Dorothyann Peng, MD  Magnesium 250 MG TABS TAKE 1 TABLET BY MOUTH DAILY WITH EVENING MEALS Patient taking differently: Take 250 mg by mouth daily as needed (low magnesium).  03/07/19  Yes Dorothyann Peng, MD  nitroGLYCERIN (NITROSTAT) 0.4 MG SL tablet Place 1 tablet (0.4 mg total) under the tongue every 5 (five) minutes as needed for chest pain. 07/13/18  Yes Vann, Jessica U, DO  olmesartan-hydrochlorothiazide (BENICAR HCT) 40-25 MG tablet Take 1 tablet by mouth daily. 08/03/19  Yes Dorothyann Peng, MD  topiramate (TOPAMAX) 100 MG tablet Take 1 tablet (100 mg total) by mouth at bedtime. 06/21/19  Yes Levert Feinstein, MD  traMADol (ULTRAM) 50 MG tablet Take 1 tablet (50 mg total) by mouth every 12 (twelve) hours as needed. Patient taking differently: Take 50 mg by mouth every 12 (twelve) hours as needed for moderate pain.  06/21/19  Yes Levert Feinstein, MD  Diclofenac Potassium,Migraine, (CAMBIA) 50 MG PACK Take 50 mg by mouth as needed. Patient not taking: Reported on 08/07/2019 12/21/18   Glean Salvo, NP  ezetimibe (ZETIA) 10 MG tablet Take 1 tablet (10 mg total) by mouth daily. 04/19/19 07/18/19  Chilton Si, MD  meloxicam (MOBIC) 15 MG tablet Take 1 tablet (15 mg total) by mouth daily as needed for pain. Patient not taking: Reported on 08/07/2019 06/21/19   Levert Feinstein, MD  tiZANidine (ZANAFLEX) 4 MG tablet Take 1 tablet (4 mg total) by mouth every 8 (eight) hours as needed for muscle spasms. Patient not taking: Reported on 08/07/2019 03/10/19   Domenick Gong, MD  traMADol (ULTRAM) 50 MG tablet TAKE 1 TABLET (50 MG TOTAL) BY MOUTH EVERY 6 (SIX) HOURS AS NEEDED. Patient not taking: Reported on 08/07/2019 06/07/19   Dorothyann Peng, MD     Family History  Problem Relation Age of Onset  . Heart disease Mother   . CAD Mother   . Stroke Mother   . Heart disease Sister   . Heart attack Sister   . Liver disease Brother   . Other Father        unknown medical history  . CAD Brother   . Colon cancer Neg Hx    . Esophageal cancer Neg Hx   . Rectal cancer Neg Hx   . Stomach cancer Neg Hx     Social History   Socioeconomic History  . Marital status: Divorced    Spouse name: Not on file  . Number of children: 1  . Years of education: Not on file  . Highest education level: Some college, no degree  Occupational History  . Occupation: Control and instrumentation engineer  Tobacco Use  . Smoking status: Never Smoker  . Smokeless tobacco: Never Used  Vaping Use  . Vaping Use: Never used  Substance and Sexual Activity  . Alcohol use: Yes    Alcohol/week: 7.0  standard drinks    Types: 7 Glasses of wine per week    Comment: social  . Drug use: No  . Sexual activity: Yes    Birth control/protection: Post-menopausal  Other Topics Concern  . Not on file  Social History Narrative   Right-handed.   Occasional caffeine.   Lives alone.   Social Determinants of Health   Financial Resource Strain:   . Difficulty of Paying Living Expenses:   Food Insecurity:   . Worried About Programme researcher, broadcasting/film/video in the Last Year:   . Barista in the Last Year:   Transportation Needs:   . Freight forwarder (Medical):   Marland Kitchen Lack of Transportation (Non-Medical):   Physical Activity:   . Days of Exercise per Week:   . Minutes of Exercise per Session:   Stress:   . Feeling of Stress :   Social Connections:   . Frequency of Communication with Friends and Family:   . Frequency of Social Gatherings with Friends and Family:   . Attends Religious Services:   . Active Member of Clubs or Organizations:   . Attends Banker Meetings:   Marland Kitchen Marital Status:      Review of Systems: A 12 point ROS discussed and pertinent positives are indicated in the HPI above.  All other systems are negative.  Review of Systems  Constitutional: Negative for fatigue and fever.  Respiratory: Negative for cough and shortness of breath.   Cardiovascular: Negative for chest pain.  Gastrointestinal: Negative for  abdominal pain, constipation, nausea and vomiting.  Genitourinary: Negative for dysuria.  Musculoskeletal: Negative for back pain.  Neurological: Positive for headaches. Negative for dizziness.  Psychiatric/Behavioral: Negative for behavioral problems and confusion.    Vital Signs: BP (!) 158/80 (BP Location: Left Arm)   Pulse 67   Temp 98.5 F (36.9 C) (Oral)   Resp 18   Ht 5\' 1"  (1.549 m)   Wt 194 lb 10.7 oz (88.3 kg)   LMP 06/03/2011   SpO2 99%   BMI 36.78 kg/m   Physical Exam Vitals and nursing note reviewed.  Constitutional:      General: She is not in acute distress.    Appearance: Normal appearance. She is not ill-appearing.  HENT:     Mouth/Throat:     Mouth: Mucous membranes are moist.     Pharynx: Oropharynx is clear.  Cardiovascular:     Rate and Rhythm: Normal rate and regular rhythm.  Pulmonary:     Effort: Pulmonary effort is normal. No respiratory distress.     Breath sounds: Normal breath sounds.  Abdominal:     General: Abdomen is flat.     Palpations: Abdomen is soft.  Skin:    General: Skin is warm and dry.  Neurological:     General: No focal deficit present.     Mental Status: She is alert and oriented to person, place, and time. Mental status is at baseline.  Psychiatric:        Mood and Affect: Mood normal.        Behavior: Behavior normal.        Thought Content: Thought content normal.        Judgment: Judgment normal.      MD Evaluation Airway: WNL Heart: WNL Abdomen: WNL Chest/ Lungs: WNL ASA  Classification: 3 Mallampati/Airway Score: Two   Imaging: CT Angio Head W or Wo Contrast  Result Date: 08/07/2019 CLINICAL DATA:  Left-sided facial droop and  weakness that resolved but has subsequently returned. EXAM: CT ANGIOGRAPHY HEAD AND NECK TECHNIQUE: Multidetector CT imaging of the head and neck was performed using the standard protocol during bolus administration of intravenous contrast. Multiplanar CT image reconstructions and  MIPs were obtained to evaluate the vascular anatomy. Carotid stenosis measurements (when applicable) are obtained utilizing NASCET criteria, using the distal internal carotid diameter as the denominator. CONTRAST:  36mL OMNIPAQUE IOHEXOL 350 MG/ML SOLN COMPARISON:  MRA of the head neck 08/18/2018 FINDINGS: CTA NECK FINDINGS Aortic arch: Atheromatous plaque. No acute finding. Three vessel branching. Right carotid system: Bifurcation of the common carotid is near the mandibular angle. There is primarily calcified plaque at the bulb with ICA occlusion, a progression from prior when there was critical stenosis. The downstream vessel is likely collapsed. Left carotid system: Mixed density mainly calcified plaque at the proximal ICA with 75% stenosis based on source images. No ulceration or dissection is seen. Vertebral arteries: No proximal subclavian stenosis. The left vertebral artery is dominant. No vertebral stenosis or beading. Skeleton: No acute finding. Degenerative facet spurring at multiple levels. Other neck: No emergent finding. Upper chest: Negative Review of the MIP images confirms the above findings CTA HEAD FINDINGS Anterior circulation: Complete circle-of-Willis with hypoplastic right A1 segment and small anterior communicating artery. The right ICA is reconstituted at the level of the cavernous segment with even greater opacification flow at the level of the posterior communicating artery. No branch occlusion, beading, or aneurysm. No visible vascular malformation. Posterior circulation: Vertebral and basilar arteries are smooth and widely patent. No branch occlusion, beading, or aneurysm. Mild right P2/3 segment stenosis. Venous sinuses: Reported on dedicated study Anatomic variants: As above Review of the MIP images confirms the above findings IMPRESSION: 1. Right ICA occlusion in the neck with intracranial reconstitution. No branch occlusion, vascular malformation, aneurysm, or visible vasculitis to  correlate with the subarachnoid hemorrhage. 2. 75% left proximal ICA stenosis. 3. No stenosis seen in the posterior circulation. Electronically Signed   By: Marnee Spring M.D.   On: 08/07/2019 07:53   CT HEAD WO CONTRAST  Result Date: 08/07/2019 CLINICAL DATA:  Left-sided facial droop and weakness.  Headache. EXAM: CT HEAD WITHOUT CONTRAST TECHNIQUE: Contiguous axial images were obtained from the base of the skull through the vertex without intravenous contrast. COMPARISON:  August 18, 2018 FINDINGS: Brain: There is subarachnoid hemorrhage in the right superior frontal lobe region. There is subtle edema in this area as well. No hemorrhage seen elsewhere. No well-defined mass. Ventricles and sulci appear unremarkable. Elsewhere brain parenchyma appears unremarkable. Vascular: There is no appreciable hyperdense vessel. There is calcification in the carotid siphon regions bilaterally. Skull: The bony calvarium appears intact. Sinuses/Orbits: Visualized paranasal sinuses are clear. Orbits appear symmetric bilaterally. Other: Mastoid air cells are clear. IMPRESSION: Subarachnoid hemorrhage with subtle edema in the right superior frontal lobe anteriorly. Suspect early hemorrhagic infarct in this area. No other findings suggesting hemorrhage elsewhere. No extra-axial fluid collection. Elsewhere brain parenchyma appears unremarkable. There are foci of arterial vascular calcification. Critical Value/emergent results were called by telephone at the time of interpretation on 08/07/2019 at 6:03 am to provider [facility] , who verbally acknowledged these results. Electronically Signed   By: Bretta Bang III M.D.   On: 08/07/2019 06:04   CT Angio Neck W and/or Wo Contrast  Result Date: 08/07/2019 CLINICAL DATA:  Left-sided facial droop and weakness that resolved but has subsequently returned. EXAM: CT ANGIOGRAPHY HEAD AND NECK TECHNIQUE: Multidetector CT imaging of  the head and neck was performed using the standard  protocol during bolus administration of intravenous contrast. Multiplanar CT image reconstructions and MIPs were obtained to evaluate the vascular anatomy. Carotid stenosis measurements (when applicable) are obtained utilizing NASCET criteria, using the distal internal carotid diameter as the denominator. CONTRAST:  75mL OMNIPAQUE IOHEXOL 350 MG/ML SOLN COMPARISON:  MRA of the head neck 08/18/2018 FINDINGS: CTA NECK FINDINGS Aortic arch: Atheromatous plaque. No acute finding. Three vessel branching. Right carotid system: Bifurcation of the common carotid is near the mandibular angle. There is primarily calcified plaque at the bulb with ICA occlusion, a progression from prior when there was critical stenosis. The downstream vessel is likely collapsed. Left carotid system: Mixed density mainly calcified plaque at the proximal ICA with 75% stenosis based on source images. No ulceration or dissection is seen. Vertebral arteries: No proximal subclavian stenosis. The left vertebral artery is dominant. No vertebral stenosis or beading. Skeleton: No acute finding. Degenerative facet spurring at multiple levels. Other neck: No emergent finding. Upper chest: Negative Review of the MIP images confirms the above findings CTA HEAD FINDINGS Anterior circulation: Complete circle-of-Willis with hypoplastic right A1 segment and small anterior communicating artery. The right ICA is reconstituted at the level of the cavernous segment with even greater opacification flow at the level of the posterior communicating artery. No branch occlusion, beading, or aneurysm. No visible vascular malformation. Posterior circulation: Vertebral and basilar arteries are smooth and widely patent. No branch occlusion, beading, or aneurysm. Mild right P2/3 segment stenosis. Venous sinuses: Reported on dedicated study Anatomic variants: As above Review of the MIP images confirms the above findings IMPRESSION: 1. Right ICA occlusion in the neck with  intracranial reconstitution. No branch occlusion, vascular malformation, aneurysm, or visible vasculitis to correlate with the subarachnoid hemorrhage. 2. 75% left proximal ICA stenosis. 3. No stenosis seen in the posterior circulation. Electronically Signed   By: Marnee Spring M.D.   On: 08/07/2019 07:53   MR BRAIN WO CONTRAST  Result Date: 08/07/2019 CLINICAL DATA:  Right frontal headache 3 days ago. Left-sided weakness. Right ICA occlusion. Left ICA stenosis. EXAM: MRI HEAD WITHOUT CONTRAST TECHNIQUE: Multiplanar, multiecho pulse sequences of the brain and surrounding structures were obtained without intravenous contrast. COMPARISON:  CT studies same day.  MRI 08/18/2018 FINDINGS: Brain: Diffusion imaging shows 2 adjacent subcentimeter foci of acute infarction in the deep insula on the right. Involvement of the cortex and external capsule. No other acute infarction. No mass lesion, hemorrhage, hydrocephalus or extra-axial collection. Subarachnoid hemorrhage is evident within the sulci of the right frontal and parietal region. No sign of intraparenchymal hemorrhage. Brain does not show a pattern of old ischemic changes. No hydrocephalus. No extra-axial collection. Vascular: Major vessels at the base of the brain show flow. Skull and upper cervical spine: Negative Sinuses/Orbits: Clear/normal Other: None IMPRESSION: Two subcentimeter foci of acute infarction in the right deep insula, 1 affecting the cortex in the other affecting the external capsule region. Subarachnoid hemorrhage within the sulci on the right as seen by previous CT. Electronically Signed   By: Paulina Fusi M.D.   On: 08/07/2019 10:14   ECHOCARDIOGRAM COMPLETE  Result Date: 08/08/2019    ECHOCARDIOGRAM REPORT   Patient Name:   [name] Solinger Date of Exam: 08/08/2019 Medical Rec #:  [id number]        Height:       61.0 in Accession #:    [id number]       Weight:  192.0 lb Date of Birth:  03-17-1960        BSA:          1.856 m Patient  Age:    59 years         BP:           156/81 mmHg Patient Gender: F                HR:           73 bpm. Exam Location:  Inpatient Procedure: 2D Echo Indications:    stroke 434.91  History:        Patient has prior history of Echocardiogram examinations, most                 recent 07/13/2016. CAD; Risk Factors:Hypertension and                 Dyslipidemia.  Sonographer:    Celene Skeen RDCS (AE) Referring Phys: [id number] Marvel Plan  Sonographer Comments: Image acquisition challenging due to patient body habitus. IMPRESSIONS  1. Left ventricular ejection fraction, by estimation, is 60 to 65%. The left ventricle has normal function. The left ventricle has no regional wall motion abnormalities. Left ventricular diastolic parameters are consistent with Grade I diastolic dysfunction (impaired relaxation).  2. Right ventricular systolic function is normal. The right ventricular size is normal. There is moderately elevated pulmonary artery systolic pressure.  3. The mitral valve is normal in structure. Trivial mitral valve regurgitation. No evidence of mitral stenosis.  4. The aortic valve is normal in structure. Aortic valve regurgitation is mild. No aortic stenosis is present.  5. The inferior vena cava is dilated in size with >50% respiratory variability, suggesting right atrial pressure of 8 mmHg. FINDINGS  Left Ventricle: Left ventricular ejection fraction, by estimation, is 60 to 65%. The left ventricle has normal function. The left ventricle has no regional wall motion abnormalities. The left ventricular internal cavity size was normal in size. There is  no left ventricular hypertrophy. Left ventricular diastolic parameters are consistent with Grade I diastolic dysfunction (impaired relaxation). Normal left ventricular filling pressure. Right Ventricle: The right ventricular size is normal. No increase in right ventricular wall thickness. Right ventricular systolic function is normal. There is moderately elevated  pulmonary artery systolic pressure. The tricuspid regurgitant velocity is 3.08 m/s, and with an assumed right atrial pressure of 8 mmHg, the estimated right ventricular systolic pressure is 45.9 mmHg. Left Atrium: Left atrial size was normal in size. Right Atrium: Right atrial size was normal in size. Pericardium: There is no evidence of pericardial effusion. Mitral Valve: The mitral valve is normal in structure. Normal mobility of the mitral valve leaflets. Trivial mitral valve regurgitation. No evidence of mitral valve stenosis. Tricuspid Valve: The tricuspid valve is normal in structure. Tricuspid valve regurgitation is trivial. No evidence of tricuspid stenosis. Aortic Valve: The aortic valve is normal in structure. Aortic valve regurgitation is mild. No aortic stenosis is present. Pulmonic Valve: The pulmonic valve was normal in structure. Pulmonic valve regurgitation is not visualized. No evidence of pulmonic stenosis. Aorta: The aortic root is normal in size and structure. Venous: The inferior vena cava is dilated in size with greater than 50% respiratory variability, suggesting right atrial pressure of 8 mmHg. IAS/Shunts: No atrial level shunt detected by color flow Doppler.  LEFT VENTRICLE PLAX 2D LVIDd:         4.10 cm  Diastology LVIDs:  2.40 cm  LV e' lateral:   7.07 cm/s LV PW:         1.20 cm  LV E/e' lateral: 8.5 LV IVS:        0.90 cm  LV e' medial:    7.29 cm/s LVOT diam:     1.70 cm  LV E/e' medial:  8.2 LV SV:         37 LV SV Index:   20 LVOT Area:     2.27 cm  LEFT ATRIUM           Index LA diam:      3.70 cm 1.99 cm/m LA Vol (A2C): 28.5 ml 15.35 ml/m  AORTIC VALVE LVOT Vmax:   81.60 cm/s LVOT Vmean:  66.600 cm/s LVOT VTI:    0.163 m  AORTA Ao Root diam: 2.40 cm MITRAL VALVE               TRICUSPID VALVE MV Area (PHT): 3.89 cm    TR Peak grad:   37.9 mmHg MV Decel Time: 195 msec    TR Vmax:        308.00 cm/s MV E velocity: 60.00 cm/s MV A velocity: 63.40 cm/s  SHUNTS MV E/A ratio:   0.95        Systemic VTI:  0.16 m                            Systemic Diam: 1.70 cm Chilton Si MD Electronically signed by Chilton Si MD Signature Date/Time: 08/08/2019/4:35:50 PM    Final    CT VENOGRAM HEAD  Result Date: 08/07/2019 CLINICAL DATA:  Subarachnoid hemorrhage, evaluate for dural sinus thrombosis. EXAM: CT VENOGRAM HEAD TECHNIQUE: Venous timed CT of the head was performed after bolus administration of iodinated contrast for CTA. CONTRAST:  41mL OMNIPAQUE IOHEXOL 350 MG/ML SOLN COMPARISON:  Head CT from earlier today FINDINGS: The major dural sinuses are patent. No detected tangle of vessels, cortical vein thrombosis, or venous distension. Deep veins are symmetrically opacified. No detected increase in the subarachnoid hemorrhage centered at the superior right frontal lobe. IMPRESSION: No evidence of venous thrombosis. Electronically Signed   By: Marnee Spring M.D.   On: 08/07/2019 08:16    Labs:  CBC: Recent Labs    08/07/19 0433 08/08/19 0350 08/09/19 0224 08/10/19 0506  WBC 9.7 8.6 10.2 9.7  HGB 10.7* 9.9* 9.8* 10.7*  HCT 35.0* 31.4* 30.9* 34.0*  PLT 321 297 340 317    COAGS: Recent Labs    08/18/18 1326 08/07/19 1122  INR 1.1 1.1  APTT 27 25    BMP: Recent Labs    08/07/19 0433 08/08/19 0350 08/09/19 0224 08/10/19 0506  NA 140 139 140 140  K 4.0 3.5 3.7 3.7  CL 108 107 107 105  CO2 24 25 26 28   GLUCOSE 117* 108* 105* 117*  BUN 17 15 13 12   CALCIUM 9.2 8.6* 9.0 9.4  CREATININE 0.95 0.86 0.83 0.86  GFRNONAA >60 >60 >60 >60  GFRAA >60 >60 >60 >60    LIVER FUNCTION TESTS: Recent Labs    09/14/18 1039 03/14/19 1217 04/04/19 0820 08/07/19 0433  BILITOT 0.8 0.6 0.7 0.7  AST 23 19 18 21   ALT 23 23 23 21   ALKPHOS 95 100 94 63  PROT 7.6 7.7 7.4 6.8  ALBUMIN 4.8 4.6 4.5 3.7    TUMOR MARKERS: No results for input(s): AFPTM, CEA, CA199, CHROMGRNA in the  last 8760 hours.  Assessment and Plan: Right ICA occlusion in the neck with  intra-cranial reconstitution Subarachnoid hemorrhage with subtle edema in the right superior frontal lobe anteriorly.  Patient presents with subarachnoid hemorrhage in the setting high-grade R ICA occlusion.  She continues to complain of headache today which is stable. Dr. Roda Shutters has discussed with Dr. Corliss Skains.  Plan made to proceed with diagnostic angiogram today.  [name] has been NPO.  She has not taken her Plavix in several days.   Risks and benefits of cerebral angiogram were discussed with the patient including, but not limited to bleeding, infection, vascular injury or contrast induced renal failure.  This interventional procedure involves the use of X-rays and because of the nature of the planned procedure, it is possible that we will have prolonged use of X-ray fluoroscopy.  Potential radiation risks to you include (but are not limited to) the following: - A slightly elevated risk for cancer  several years later in life. This risk is typically less than 0.5% percent. This risk is low in comparison to the normal incidence of human cancer, which is 33% for women and 50% for men according to the [employer]. - Radiation induced injury can include skin redness, resembling a rash, tissue breakdown / ulcers and hair loss (which can be temporary or permanent).   The likelihood of either of these occurring depends on the difficulty of the procedure and whether you are sensitive to radiation due to previous procedures, disease, or genetic conditions.   IF your procedure requires a prolonged use of radiation, you will be notified and given written instructions for further action.  It is your responsibility to monitor the irradiated area for the 2 weeks following the procedure and to notify your physician if you are concerned that you have suffered a radiation induced injury.    All of the patient's questions were answered, patient is agreeable to proceed.  Consent signed and in  chart.  Thank you for this interesting consult.  I greatly enjoyed meeting [name] and look forward to participating in their care.  A copy of this report was sent to the requesting provider on this date.  Electronically Signed: Hoyt Koch, PA 08/10/2019, 1:18 PM   I spent a total of 40 Minutes    in face to face in clinical consultation, greater than 50% of which was counseling/coordinating care for intra-cranial stenosis, R ICA occlusion.

## 2019-08-10 NOTE — Discharge Summary (Signed)
Physician Discharge Summary  Rebekah HeadlandBlanche R Wagner ZOX:[id number] DOB: 15-Feb-1960 DOA: 08/07/2019  PCP: Dorothyann PengSanders, Robyn, MD  Admit date: 08/07/2019 Discharge date: 08/10/2019  Admitted From: Home Disposition: Home  Recommendations for Outpatient Follow-up:  1. Follow up with PCP in 1-2 weeks, neurology as scheduled 2. Please obtain BMP/CBC in one week   Home Health: None Equipment/Devices: None  Discharge Condition: Stable CODE STATUS: Full Diet recommendation: As tolerated  Brief/Interim Summary: [age] year old female with PMH as below, which is significant for CAD, HTN, Hypothyroid, COVID vaccination, and migraines. Migraines are a more recent issue and has been worked up by Neurology including CT/CTA and MRI/MRA of the brain and neck about one year ago. MRI noted ICA stenosis. No structural abnormalities noted. Treated with Topamax and symptoms improved. Now 7/27 she is presenting to [facility]  with R frontal headache x 3 days. She began to experience L sided weakness on the morning of the 27th prompting ER visit. Upon arrival to ED she underwent CT of the head, which demonstrated subarachnoid hemorrhage in the R frontal lobe with some mild edema. Notably she has received both asa and plavix. Neurosurgery was called and recommended MRI. No surgical needs at this time. She was hypertensive and started on clevidipine infusion and placed in the ICU overnight.  Blood pressure now markedly well controlled, transfer to medical floor on telemetry for remainder of work-up, neurosurgery and neurology continue to follow along we appreciate their insight and recommendations.  Patient admitted with headache left facial droop and leg weakness only noted on imaging to have small insular infarcts with right ICA occlusion and subarachnoid hemorrhage as well as high-grade internal carotid artery stenosis on the left.  Was evaluated by neurology, recommending to hold dual antiplatelet therapy for 1 week and repeat  CT in the outpatient setting.  If resolving would further discuss with patient at their office for resumption of antiplatelet therapy.  She also had notably occluded ICA with high-grade stenosis, undergoing further imaging today with follow-up outpatient with vascular for possible stenting.  At this time patient's symptoms have resolved, blood pressure is well controlled and is otherwise stable and agreeable for discharge home.  Discharge Diagnoses:  Active Problems:   SAH (subarachnoid hemorrhage) [facility](HCC)    Discharge Instructions  Discharge Instructions    Call MD for:  difficulty breathing, headache or visual disturbances   Complete by: As directed    Call MD for:  extreme fatigue   Complete by: As directed    Call MD for:  hives   Complete by: As directed    Call MD for:  persistant dizziness or light-headedness   Complete by: As directed    Call MD for:  persistant nausea and vomiting   Complete by: As directed    Call MD for:  severe uncontrolled pain   Complete by: As directed    Call MD for:  temperature >100.4   Complete by: As directed    Diet - low sodium heart healthy   Complete by: As directed    Increase activity slowly   Complete by: As directed      Allergies as of 08/10/2019   No Known Allergies     Medication List    STOP taking these medications   aspirin 81 MG EC tablet   clopidogrel 75 MG tablet Commonly known as: PLAVIX     TAKE these medications   acetaminophen 500 MG tablet Commonly known as: TYLENOL Take 1,000 mg by mouth every  6 (six) hours as needed for headache (pain).   atorvastatin 80 MG tablet Commonly known as: LIPITOR TAKE 1 TABLET (80 MG TOTAL) BY MOUTH DAILY AT 6 PM.   Cambia 50 MG Pack Generic drug: Diclofenac Potassium(Migraine) Take 50 mg by mouth as needed.   carvedilol 6.25 MG tablet Commonly known as: COREG TAKE 1 TABLET (6.25 MG TOTAL) BY MOUTH 2 (TWO) TIMES DAILY WITH A MEAL.   cholecalciferol 25 MCG (1000 UNIT)  tablet Commonly known as: VITAMIN D3 Take 1,000 Units by mouth daily.   ezetimibe 10 MG tablet Commonly known as: ZETIA Take 1 tablet (10 mg total) by mouth daily.   fluticasone 50 MCG/ACT nasal spray Commonly known as: FLONASE Place 2 sprays into both nostrils daily as needed for allergies or rhinitis.   levocetirizine 5 MG tablet Commonly known as: XYZAL Take 1 tablet (5 mg total) by mouth daily.   Magnesium 250 MG Tabs TAKE 1 TABLET BY MOUTH DAILY WITH EVENING MEALS What changed: See the new instructions.   meloxicam 15 MG tablet Commonly known as: Mobic Take 1 tablet (15 mg total) by mouth daily as needed for pain.   nitroGLYCERIN 0.4 MG SL tablet Commonly known as: NITROSTAT Place 1 tablet (0.4 mg total) under the tongue every 5 (five) minutes as needed for chest pain.   olmesartan-hydrochlorothiazide 40-25 MG tablet Commonly known as: BENICAR HCT Take 1 tablet by mouth daily.   tiZANidine 4 MG tablet Commonly known as: ZANAFLEX Take 1 tablet (4 mg total) by mouth every 8 (eight) hours as needed for muscle spasms.   topiramate 100 MG tablet Commonly known as: TOPAMAX Take 1 tablet (100 mg total) by mouth at bedtime.   traMADol 50 MG tablet Commonly known as: ULTRAM TAKE 1 TABLET (50 MG TOTAL) BY MOUTH EVERY 6 (SIX) HOURS AS NEEDED. What changed: Another medication with the same name was changed. Make sure you understand how and when to take each.   traMADol 50 MG tablet Commonly known as: ULTRAM Take 1 tablet (50 mg total) by mouth every 12 (twelve) hours as needed. What changed: reasons to take this       No Known Allergies  Consultations:  Neurology   Procedures/Studies: CT Angio Head W or Wo Contrast  Result Date: 08/07/2019 CLINICAL DATA:  Left-sided facial droop and weakness that resolved but has subsequently returned. EXAM: CT ANGIOGRAPHY HEAD AND NECK TECHNIQUE: Multidetector CT imaging of the head and neck was performed using the standard  protocol during bolus administration of intravenous contrast. Multiplanar CT image reconstructions and MIPs were obtained to evaluate the vascular anatomy. Carotid stenosis measurements (when applicable) are obtained utilizing NASCET criteria, using the distal internal carotid diameter as the denominator. CONTRAST:  75mL OMNIPAQUE IOHEXOL 350 MG/ML SOLN COMPARISON:  MRA of the head neck 08/18/2018 FINDINGS: CTA NECK FINDINGS Aortic arch: Atheromatous plaque. No acute finding. Three vessel branching. Right carotid system: Bifurcation of the common carotid is near the mandibular angle. There is primarily calcified plaque at the bulb with ICA occlusion, a progression from prior when there was critical stenosis. The downstream vessel is likely collapsed. Left carotid system: Mixed density mainly calcified plaque at the proximal ICA with 75% stenosis based on source images. No ulceration or dissection is seen. Vertebral arteries: No proximal subclavian stenosis. The left vertebral artery is dominant. No vertebral stenosis or beading. Skeleton: No acute finding. Degenerative facet spurring at multiple levels. Other neck: No emergent finding. Upper chest: Negative Review of the MIP images  confirms the above findings CTA HEAD FINDINGS Anterior circulation: Complete circle-of-Willis with hypoplastic right A1 segment and small anterior communicating artery. The right ICA is reconstituted at the level of the cavernous segment with even greater opacification flow at the level of the posterior communicating artery. No branch occlusion, beading, or aneurysm. No visible vascular malformation. Posterior circulation: Vertebral and basilar arteries are smooth and widely patent. No branch occlusion, beading, or aneurysm. Mild right P2/3 segment stenosis. Venous sinuses: Reported on dedicated study Anatomic variants: As above Review of the MIP images confirms the above findings IMPRESSION: 1. Right ICA occlusion in the neck with  intracranial reconstitution. No branch occlusion, vascular malformation, aneurysm, or visible vasculitis to correlate with the subarachnoid hemorrhage. 2. 75% left proximal ICA stenosis. 3. No stenosis seen in the posterior circulation. Electronically Signed   By: Marnee Spring M.D.   On: 08/07/2019 07:53   CT HEAD WO CONTRAST  Result Date: 08/07/2019 CLINICAL DATA:  Left-sided facial droop and weakness.  Headache. EXAM: CT HEAD WITHOUT CONTRAST TECHNIQUE: Contiguous axial images were obtained from the base of the skull through the vertex without intravenous contrast. COMPARISON:  August 18, 2018 FINDINGS: Brain: There is subarachnoid hemorrhage in the right superior frontal lobe region. There is subtle edema in this area as well. No hemorrhage seen elsewhere. No well-defined mass. Ventricles and sulci appear unremarkable. Elsewhere brain parenchyma appears unremarkable. Vascular: There is no appreciable hyperdense vessel. There is calcification in the carotid siphon regions bilaterally. Skull: The bony calvarium appears intact. Sinuses/Orbits: Visualized paranasal sinuses are clear. Orbits appear symmetric bilaterally. Other: Mastoid air cells are clear. IMPRESSION: Subarachnoid hemorrhage with subtle edema in the right superior frontal lobe anteriorly. Suspect early hemorrhagic infarct in this area. No other findings suggesting hemorrhage elsewhere. No extra-axial fluid collection. Elsewhere brain parenchyma appears unremarkable. There are foci of arterial vascular calcification. Critical Value/emergent results were called by telephone at the time of interpretation on 08/07/2019 at 6:03 am to provider [facility] , who verbally acknowledged these results. Electronically Signed   By: Bretta Bang III M.D.   On: 08/07/2019 06:04   CT Angio Neck W and/or Wo Contrast  Result Date: 08/07/2019 CLINICAL DATA:  Left-sided facial droop and weakness that resolved but has subsequently returned. EXAM: CT  ANGIOGRAPHY HEAD AND NECK TECHNIQUE: Multidetector CT imaging of the head and neck was performed using the standard protocol during bolus administration of intravenous contrast. Multiplanar CT image reconstructions and MIPs were obtained to evaluate the vascular anatomy. Carotid stenosis measurements (when applicable) are obtained utilizing NASCET criteria, using the distal internal carotid diameter as the denominator. CONTRAST:  95mL OMNIPAQUE IOHEXOL 350 MG/ML SOLN COMPARISON:  MRA of the head neck 08/18/2018 FINDINGS: CTA NECK FINDINGS Aortic arch: Atheromatous plaque. No acute finding. Three vessel branching. Right carotid system: Bifurcation of the common carotid is near the mandibular angle. There is primarily calcified plaque at the bulb with ICA occlusion, a progression from prior when there was critical stenosis. The downstream vessel is likely collapsed. Left carotid system: Mixed density mainly calcified plaque at the proximal ICA with 75% stenosis based on source images. No ulceration or dissection is seen. Vertebral arteries: No proximal subclavian stenosis. The left vertebral artery is dominant. No vertebral stenosis or beading. Skeleton: No acute finding. Degenerative facet spurring at multiple levels. Other neck: No emergent finding. Upper chest: Negative Review of the MIP images confirms the above findings CTA HEAD FINDINGS Anterior circulation: Complete circle-of-Willis with hypoplastic right A1 segment and small  anterior communicating artery. The right ICA is reconstituted at the level of the cavernous segment with even greater opacification flow at the level of the posterior communicating artery. No branch occlusion, beading, or aneurysm. No visible vascular malformation. Posterior circulation: Vertebral and basilar arteries are smooth and widely patent. No branch occlusion, beading, or aneurysm. Mild right P2/3 segment stenosis. Venous sinuses: Reported on dedicated study Anatomic variants: As  above Review of the MIP images confirms the above findings IMPRESSION: 1. Right ICA occlusion in the neck with intracranial reconstitution. No branch occlusion, vascular malformation, aneurysm, or visible vasculitis to correlate with the subarachnoid hemorrhage. 2. 75% left proximal ICA stenosis. 3. No stenosis seen in the posterior circulation. Electronically Signed   By: Marnee Spring M.D.   On: 08/07/2019 07:53   MR BRAIN WO CONTRAST  Result Date: 08/07/2019 CLINICAL DATA:  Right frontal headache 3 days ago. Left-sided weakness. Right ICA occlusion. Left ICA stenosis. EXAM: MRI HEAD WITHOUT CONTRAST TECHNIQUE: Multiplanar, multiecho pulse sequences of the brain and surrounding structures were obtained without intravenous contrast. COMPARISON:  CT studies same day.  MRI 08/18/2018 FINDINGS: Brain: Diffusion imaging shows 2 adjacent subcentimeter foci of acute infarction in the deep insula on the right. Involvement of the cortex and external capsule. No other acute infarction. No mass lesion, hemorrhage, hydrocephalus or extra-axial collection. Subarachnoid hemorrhage is evident within the sulci of the right frontal and parietal region. No sign of intraparenchymal hemorrhage. Brain does not show a pattern of old ischemic changes. No hydrocephalus. No extra-axial collection. Vascular: Major vessels at the base of the brain show flow. Skull and upper cervical spine: Negative Sinuses/Orbits: Clear/normal Other: None IMPRESSION: Two subcentimeter foci of acute infarction in the right deep insula, 1 affecting the cortex in the other affecting the external capsule region. Subarachnoid hemorrhage within the sulci on the right as seen by previous CT. Electronically Signed   By: Paulina Fusi M.D.   On: 08/07/2019 10:14   ECHOCARDIOGRAM COMPLETE  Result Date: 08/08/2019    ECHOCARDIOGRAM REPORT   Patient Name:   [name] Date of Exam: 08/08/2019 Medical Rec #:  [id number]        Height:       61.0 in  Accession #:    [id number]       Weight:       192.0 lb Date of Birth:  May 06, 1960        BSA:          1.856 m Patient Age:    59 years         BP:           156/81 mmHg Patient Gender: F                HR:           73 bpm. Exam Location:  Inpatient Procedure: 2D Echo Indications:    stroke 434.91  History:        Patient has prior history of Echocardiogram examinations, most                 recent 07/13/2016. CAD; Risk Factors:Hypertension and                 Dyslipidemia.  Sonographer:    Celene Skeen RDCS (AE) Referring Phys: [id number] Marvel Plan  Sonographer Comments: Image acquisition challenging due to patient body habitus. IMPRESSIONS  1. Left ventricular ejection fraction, by estimation, is 60 to 65%. The left ventricle has normal function.  The left ventricle has no regional wall motion abnormalities. Left ventricular diastolic parameters are consistent with Grade I diastolic dysfunction (impaired relaxation).  2. Right ventricular systolic function is normal. The right ventricular size is normal. There is moderately elevated pulmonary artery systolic pressure.  3. The mitral valve is normal in structure. Trivial mitral valve regurgitation. No evidence of mitral stenosis.  4. The aortic valve is normal in structure. Aortic valve regurgitation is mild. No aortic stenosis is present.  5. The inferior vena cava is dilated in size with >50% respiratory variability, suggesting right atrial pressure of 8 mmHg. FINDINGS  Left Ventricle: Left ventricular ejection fraction, by estimation, is 60 to 65%. The left ventricle has normal function. The left ventricle has no regional wall motion abnormalities. The left ventricular internal cavity size was normal in size. There is  no left ventricular hypertrophy. Left ventricular diastolic parameters are consistent with Grade I diastolic dysfunction (impaired relaxation). Normal left ventricular filling pressure. Right Ventricle: The right ventricular size is normal. No  increase in right ventricular wall thickness. Right ventricular systolic function is normal. There is moderately elevated pulmonary artery systolic pressure. The tricuspid regurgitant velocity is 3.08 m/s, and with an assumed right atrial pressure of 8 mmHg, the estimated right ventricular systolic pressure is 45.9 mmHg. Left Atrium: Left atrial size was normal in size. Right Atrium: Right atrial size was normal in size. Pericardium: There is no evidence of pericardial effusion. Mitral Valve: The mitral valve is normal in structure. Normal mobility of the mitral valve leaflets. Trivial mitral valve regurgitation. No evidence of mitral valve stenosis. Tricuspid Valve: The tricuspid valve is normal in structure. Tricuspid valve regurgitation is trivial. No evidence of tricuspid stenosis. Aortic Valve: The aortic valve is normal in structure. Aortic valve regurgitation is mild. No aortic stenosis is present. Pulmonic Valve: The pulmonic valve was normal in structure. Pulmonic valve regurgitation is not visualized. No evidence of pulmonic stenosis. Aorta: The aortic root is normal in size and structure. Venous: The inferior vena cava is dilated in size with greater than 50% respiratory variability, suggesting right atrial pressure of 8 mmHg. IAS/Shunts: No atrial level shunt detected by color flow Doppler.  LEFT VENTRICLE PLAX 2D LVIDd:         4.10 cm  Diastology LVIDs:         2.40 cm  LV e' lateral:   7.07 cm/s LV PW:         1.20 cm  LV E/e' lateral: 8.5 LV IVS:        0.90 cm  LV e' medial:    7.29 cm/s LVOT diam:     1.70 cm  LV E/e' medial:  8.2 LV SV:         37 LV SV Index:   20 LVOT Area:     2.27 cm  LEFT ATRIUM           Index LA diam:      3.70 cm 1.99 cm/m LA Vol (A2C): 28.5 ml 15.35 ml/m  AORTIC VALVE LVOT Vmax:   81.60 cm/s LVOT Vmean:  66.600 cm/s LVOT VTI:    0.163 m  AORTA Ao Root diam: 2.40 cm MITRAL VALVE               TRICUSPID VALVE MV Area (PHT): 3.89 cm    TR Peak grad:   37.9 mmHg MV Decel  Time: 195 msec    TR Vmax:        308.00 cm/s  MV E velocity: 60.00 cm/s MV A velocity: 63.40 cm/s  SHUNTS MV E/A ratio:  0.95        Systemic VTI:  0.16 m                            Systemic Diam: 1.70 cm Chilton Si MD Electronically signed by Chilton Si MD Signature Date/Time: 08/08/2019/4:35:50 PM    Final    CT VENOGRAM HEAD  Result Date: 08/07/2019 CLINICAL DATA:  Subarachnoid hemorrhage, evaluate for dural sinus thrombosis. EXAM: CT VENOGRAM HEAD TECHNIQUE: Venous timed CT of the head was performed after bolus administration of iodinated contrast for CTA. CONTRAST:  6mL OMNIPAQUE IOHEXOL 350 MG/ML SOLN COMPARISON:  Head CT from earlier today FINDINGS: The major dural sinuses are patent. No detected tangle of vessels, cortical vein thrombosis, or venous distension. Deep veins are symmetrically opacified. No detected increase in the subarachnoid hemorrhage centered at the superior right frontal lobe. IMPRESSION: No evidence of venous thrombosis. Electronically Signed   By: Marnee Spring M.D.   On: 08/07/2019 08:16     Subjective: No acute issues or events overnight, tolerating p.o. well, ambulating without deficits, denies nausea, vomiting, diarrhea, constipation, headache, fevers, chills.   Discharge Exam: Vitals:   08/10/19 1605 08/10/19 1610  BP: (!) 159/88 (!) 146/81  Pulse: 72 78  Resp: 15 20  Temp:    SpO2: 98% 93%   Vitals:   08/10/19 1555 08/10/19 1600 08/10/19 1605 08/10/19 1610  BP: (!) 167/88 (!) 158/82 (!) 159/88 (!) 146/81  Pulse: (!) 125 77 72 78  Resp: 17 13 15 20   Temp:      TempSrc:      SpO2: 98% 100% 98% 93%  Weight:      Height:        General: Pt is alert, awake, not in acute distress Cardiovascular: RRR, S1/S2 +, no rubs, no gallops Respiratory: CTA bilaterally, no wheezing, no rhonchi Abdominal: Soft, NT, ND, bowel sounds + Extremities: no edema, no cyanosis    The results of significant diagnostics from this hospitalization (including  imaging, microbiology, ancillary and laboratory) are listed below for reference.     Microbiology: Recent Results (from the past 240 hour(s))  SARS Coronavirus 2 by RT PCR (hospital order, performed in [facility] hospital lab) Nasopharyngeal     Status: None   Collection Time: 08/07/19  6:38 AM   Specimen: Nasopharyngeal  Result Value Ref Range Status   SARS Coronavirus 2 NEGATIVE NEGATIVE Final    Comment: (NOTE) SARS-CoV-2 target nucleic acids are NOT DETECTED.  The SARS-CoV-2 RNA is generally detectable in upper and lower respiratory specimens during the acute phase of infection. The lowest concentration of SARS-CoV-2 viral copies this assay can detect is 250 copies / mL. A negative result does not preclude SARS-CoV-2 infection and should not be used as the sole basis for treatment or other patient management decisions.  A negative result may occur with improper specimen collection / handling, submission of specimen other than nasopharyngeal swab, presence of viral mutation(s) within the areas targeted by this assay, and inadequate number of viral copies (<250 copies / mL). A negative result must be combined with clinical observations, patient history, and epidemiological information.  Fact Sheet for Patients:   08/09/19  Fact Sheet for Healthcare Providers: BoilerBrush.com.cy  This test is not yet approved or  cleared by the https://pope.com/ FDA and has been authorized for detection and/or diagnosis of  SARS-CoV-2 by FDA under an Emergency Use Authorization (EUA).  This EUA will remain in effect (meaning this test can be used) for the duration of the COVID-19 declaration under Section 564(b)(1) of the Act, 21 U.S.C. section 360bbb-3(b)(1), unless the authorization is terminated or revoked sooner.  Performed at [facility], [address] [place], [place] [address]   MRSA PCR Screening     Status: None    Collection Time: 08/07/19 12:39 PM   Specimen: Nasal Mucosa; Nasopharyngeal  Result Value Ref Range Status   MRSA by PCR NEGATIVE NEGATIVE Final    Comment:        The GeneXpert MRSA Assay (FDA approved for NASAL specimens only), is one component of a comprehensive MRSA colonization surveillance program. It is not intended to diagnose MRSA infection nor to guide or monitor treatment for MRSA infections. Performed at [facility], [address] [place], [place] [address]      Labs: BNP (last 3 results) No results for input(s): BNP in the last 8760 hours. Basic Metabolic Panel: Recent Labs  Lab 08/07/19 0433 08/08/19 0350 08/09/19 0224 08/10/19 0506  NA 140 139 140 140  K 4.0 3.5 3.7 3.7  CL 108 107 107 105  CO2 24 25 26 28   GLUCOSE 117* 108* 105* 117*  BUN 17 15 13 12   CREATININE 0.95 0.86 0.83 0.86  CALCIUM 9.2 8.6* 9.0 9.4  MG  --  2.0 2.0  --   PHOS  --  3.9 3.9  --    Liver Function Tests: Recent Labs  Lab 08/07/19 0433  AST 21  ALT 21  ALKPHOS 63  BILITOT 0.7  PROT 6.8  ALBUMIN 3.7   No results for input(s): LIPASE, AMYLASE in the last 168 hours. No results for input(s): AMMONIA in the last 168 hours. CBC: Recent Labs  Lab 08/07/19 0433 08/08/19 0350 08/09/19 0224 08/10/19 0506  WBC 9.7 8.6 10.2 9.7  HGB 10.7* 9.9* 9.8* 10.7*  HCT 35.0* 31.4* 30.9* 34.0*  MCV 93.8 92.9 92.5 93.4  PLT 321 297 340 317   Cardiac Enzymes: No results for input(s): CKTOTAL, CKMB, CKMBINDEX, TROPONINI in the last 168 hours. BNP: Invalid input(s): POCBNP CBG: Recent Labs  Lab 08/07/19 0444  GLUCAP 100*   D-Dimer No results for input(s): DDIMER in the last 72 hours. Hgb A1c Recent Labs    08/09/19 0224  HGBA1C 6.3*   Lipid Profile Recent Labs    08/09/19 0224  CHOL 115  HDL 37*  LDLCALC 68  TRIG 50  CHOLHDL 3.1   Thyroid function studies No results for input(s): TSH, T4TOTAL, T3FREE, THYROIDAB in the last 72 hours.  Invalid input(s):  FREET3 Anemia work up No results for input(s): VITAMINB12, FOLATE, FERRITIN, TIBC, IRON, RETICCTPCT in the last 72 hours. Urinalysis    Component Value Date/Time   COLORURINE STRAW (A) 08/07/2019 0654   APPEARANCEUR CLEAR 08/07/2019 0654   LABSPEC 1.005 08/07/2019 0654   PHURINE 6.0 08/07/2019 0654   GLUCOSEU NEGATIVE 08/07/2019 0654   HGBUR SMALL (A) 08/07/2019 0654   BILIRUBINUR NEGATIVE 08/07/2019 0654   BILIRUBINUR negative 09/14/2018 1644   KETONESUR NEGATIVE 08/07/2019 0654   PROTEINUR NEGATIVE 08/07/2019 0654   UROBILINOGEN 0.2 03/10/2019 1111   NITRITE NEGATIVE 08/07/2019 0654   LEUKOCYTESUR TRACE (A) 08/07/2019 0654   Sepsis Labs Invalid input(s): PROCALCITONIN,  WBC,  LACTICIDVEN Microbiology Recent Results (from the past 240 hour(s))  SARS Coronavirus 2 by RT PCR (hospital order, performed in  [facility] hospital lab) Nasopharyngeal     Status: None   Collection Time: 08/07/19  6:38 AM   Specimen: Nasopharyngeal  Result Value Ref Range Status   SARS Coronavirus 2 NEGATIVE NEGATIVE Final    Comment: (NOTE) SARS-CoV-2 target nucleic acids are NOT DETECTED.  The SARS-CoV-2 RNA is generally detectable in upper and lower respiratory specimens during the acute phase of infection. The lowest concentration of SARS-CoV-2 viral copies this assay can detect is 250 copies / mL. A negative result does not preclude SARS-CoV-2 infection and should not be used as the sole basis for treatment or other patient management decisions.  A negative result may occur with improper specimen collection / handling, submission of specimen other than nasopharyngeal swab, presence of viral mutation(s) within the areas targeted by this assay, and inadequate number of viral copies (<250 copies / mL). A negative result must be combined with clinical observations, patient history, and epidemiological information.  Fact Sheet for Patients:   BoilerBrush.com.cy  Fact Sheet  for Healthcare Providers: https://pope.com/  This test is not yet approved or  cleared by the [place] FDA and has been authorized for detection and/or diagnosis of SARS-CoV-2 by FDA under an Emergency Use Authorization (EUA).  This EUA will remain in effect (meaning this test can be used) for the duration of the COVID-19 declaration under Section 564(b)(1) of the Act, 21 U.S.C. section 360bbb-3(b)(1), unless the authorization is terminated or revoked sooner.  Performed at [facility], [address], [place], [place] [address]   MRSA PCR Screening     Status: None   Collection Time: 08/07/19 12:39 PM   Specimen: Nasal Mucosa; Nasopharyngeal  Result Value Ref Range Status   MRSA by PCR NEGATIVE NEGATIVE Final    Comment:        The GeneXpert MRSA Assay (FDA approved for NASAL specimens only), is one component of a comprehensive MRSA colonization surveillance program. It is not intended to diagnose MRSA infection nor to guide or monitor treatment for MRSA infections. Performed at [facility], [address], [place], [place] [address]      Time coordinating discharge: Over 30 minutes  SIGNED:   Azucena Fallen, DO [facility] 08/10/2019, 4:17 PM Pager   If 7PM-7AM, please contact night-coverage [email or web address]

## 2019-08-13 ENCOUNTER — Telehealth: Payer: Self-pay

## 2019-08-13 NOTE — Telephone Encounter (Signed)
Transition Care Management Follow-up Telephone Call  Date of discharge and from where: 08/10/2019 [facility]  How have you been since you were released from the hospital? Had to call the paramedics on Saturday because of numbness on her left side going up her arm and neck.  Any questions or concerns? No  Items Reviewed:  Did the pt receive and understand the discharge instructions provided? Yes  Medications obtained and verified?Yes  Any new allergies since your discharge? No  Dietary orders reviewed? Yes low sodium, healthy heart diet  Do you have support at home? Yes  Other (ie: DME, Home Health, etc) *No  Functional Questionnaire: (I = Independent and D = Dependent)  Bathing/Dressing- I   Meal Prep- I  Eating-  I  Maintaining continence- I  Transferring/Ambulation- I  Managing Meds- I   Follow up appointments reviewed:    PCP Hospital f/u appt confirmed? Aug 10th with Christell Constant [facility] f/u appt confirmed? Aug 9th Dr. Clent Jacks  Are transportation arrangements needed?No  If their condition worsens, is the pt aware to call  their PCP or go to the ED?yes  Was the patient provided with contact information for the PCP's office or ED? Yes Was the pt encouraged to call back with questions or concerns? Yes

## 2019-08-14 ENCOUNTER — Other Ambulatory Visit: Payer: Self-pay | Admitting: Neurology

## 2019-08-14 DIAGNOSIS — I639 Cerebral infarction, unspecified: Secondary | ICD-10-CM

## 2019-08-14 DIAGNOSIS — I609 Nontraumatic subarachnoid hemorrhage, unspecified: Secondary | ICD-10-CM

## 2019-08-14 NOTE — Telephone Encounter (Signed)
Dr. Terrace Arabia pt was seen in Hospital and per Dr Roda Shutters she needs to follow up with you in 3 weeks.   Can you please review her chart and advise where pt can be scheduled?  Thank you

## 2019-08-14 NOTE — Telephone Encounter (Signed)
I have ordered repeat MRI brain, to be done end of August or early Sept,   Follow up visit after MRI brain

## 2019-08-15 MED ORDER — ALPRAZOLAM 1 MG PO TABS
ORAL_TABLET | ORAL | 0 refills | Status: DC
Start: 2019-08-15 — End: 2019-09-18

## 2019-08-15 NOTE — Addendum Note (Signed)
Addended by: Lindell Spar C on: 08/15/2019 02:13 PM   Modules accepted: Orders

## 2019-08-15 NOTE — Telephone Encounter (Signed)
[facility] uploaded notes EE

## 2019-08-15 NOTE — Telephone Encounter (Signed)
Meds ordered this encounter  Medications   ALPRAZolam (XANAX) 1 MG tablet    Sig: Take 1-2 tablets thirty minutes prior to MRI.  May take one additional tablet before entering scanner, if needed.  MUST HAVE DRIVER.    Dispense:  3 tablet    Refill:  0     

## 2019-08-15 NOTE — Addendum Note (Signed)
Addended by: Levert Feinstein on: 08/15/2019 02:41 PM   Modules accepted: Orders

## 2019-08-15 NOTE — Telephone Encounter (Signed)
Patient returned my call she is scheduled at [facility] for 09/04/19. She also stated she is claustrophobic and would need something to help her. She is aware to have a driver.

## 2019-08-15 NOTE — Telephone Encounter (Signed)
LVM for pt to call back about scheduling mri  [facility] auth: [id number] (exp. 08/15/19 to 11/13/19)

## 2019-08-15 NOTE — Telephone Encounter (Signed)
NKDA. Rx for alprazolam 1mg , per MRI protocol, sent to Dr. for approval.

## 2019-08-16 ENCOUNTER — Encounter: Payer: Self-pay | Admitting: Orthopaedic Surgery

## 2019-08-20 ENCOUNTER — Encounter: Payer: Self-pay | Admitting: Surgery

## 2019-08-20 ENCOUNTER — Ambulatory Visit (HOSPITAL_COMMUNITY)
Admission: RE | Admit: 2019-08-20 | Discharge: 2019-08-20 | Disposition: A | Discharge status: Home / Self Care | Payer: 59 | Source: Ambulatory Visit | Attending: Surgery | Admitting: Surgery

## 2019-08-20 ENCOUNTER — Encounter (HOSPITAL_COMMUNITY): Payer: Self-pay

## 2019-08-20 ENCOUNTER — Ambulatory Visit (INDEPENDENT_AMBULATORY_CARE_PROVIDER_SITE_OTHER): Payer: 59 | Admitting: Surgery

## 2019-08-20 ENCOUNTER — Other Ambulatory Visit: Payer: Self-pay

## 2019-08-20 VITALS — BP 118/77 | HR 64 | Temp 97.8°F | Resp 20 | Ht 61.0 in | Wt 189.0 lb

## 2019-08-20 DIAGNOSIS — I6523 Occlusion and stenosis of bilateral carotid arteries: Secondary | ICD-10-CM

## 2019-08-20 NOTE — Progress Notes (Signed)
[facility] [place]  Patient name: [name] MRN: [id number] DOB: December 30, 1960 Sex: female   REQUESTING PROVIDER:    Dr. Terrace Arabia   REASON FOR CONSULT:    Carotid stenosis  HISTORY OF PRESENT ILLNESS:   [name] is a 59 y.o. female, who is referred for carotid disease.  She presented to the emergency department on 08/07/2019 with left leg heaviness and facial numbness.  Her symptoms resolved spontaneously.  CT scan in the ER revealed a right-sided subarachnoid hemorrhage.  Neurosurgery did not feel that she was a candidate for intervention and so she was treated with aspirin and Plavix.  She was also found to have 2 small right insular infarcts.  She underwent carotid angiogram that showed right carotid occlusion.  Prior to her hospital admission, she had  a TIA work-up that revealed a 80-99% right carotid stenosis.   She still has episodes of numbness and tingling in her left and foot, as well as occasional dizziness.  Patient has a history of coronary artery disease.  She is medically managed for hypertension.  She takes a statin for hypercholesterolemia.  She is a non-smoker.  PAST MEDICAL HISTORY    Past Medical History:  Diagnosis Date  . Allergy   . Arthritis    back   . Atypical chest pain 06/30/2016  . Back pain    DUE TO INJURY AT WORK ON05/2013  . Bruises easily   . Chronic lower back pain   . Coronary artery disease   . Depression    was on meds 2 yrs ago   . Headache(784.0)    rarely  . History of bronchitis    last time about 65yrs ago   . Hypertension    takes Benicar daily  . Hypothyroidism 06/30/2016  . Pure hypercholesterolemia 05/31/2019  . Vitamin D deficiency    takes Vitamin D 2 times/wk  . Weakness    and numbness right foot     FAMILY HISTORY   Family History  Problem Relation Age of Onset  . Heart disease Mother   . CAD Mother   . Stroke Mother   . Heart disease Sister   .  Heart attack Sister   . Liver disease Brother   . Other Father        unknown medical history  . CAD Brother   . Colon cancer Neg Hx   . Esophageal cancer Neg Hx   . Rectal cancer Neg Hx   . Stomach cancer Neg Hx     SOCIAL HISTORY:   Social History   Socioeconomic History  . Marital status: Divorced    Spouse name: Not on file  . Number of children: 1  . Years of education: Not on file  . Highest education level: Some college, no degree  Occupational History  . Occupation: Control and instrumentation engineer  Tobacco Use  . Smoking status: Never Smoker  . Smokeless tobacco: Never Used  Vaping Use  . Vaping Use: Never used  Substance and Sexual Activity  . Alcohol use: Yes    Alcohol/week: 7.0 standard drinks    Types: 7 Glasses of wine per week    Comment: social  . Drug use: No  . Sexual activity: Yes    Birth control/protection: Post-menopausal  Other Topics Concern  . Not on file  Social History Narrative   Right-handed.   Occasional caffeine.   Lives alone.   Social Determinants of Health   Financial  Resource Strain:   . Difficulty of Paying Living Expenses:   Food Insecurity:   . Worried About Programme researcher, broadcasting/film/video in the Last Year:   . Barista in the Last Year:   Transportation Needs:   . Freight forwarder (Medical):   Marland Kitchen Lack of Transportation (Non-Medical):   Physical Activity:   . Days of Exercise per Week:   . Minutes of Exercise per Session:   Stress:   . Feeling of Stress :   Social Connections:   . Frequency of Communication with Friends and Family:   . Frequency of Social Gatherings with Friends and Family:   . Attends Religious Services:   . Active Member of Clubs or Organizations:   . Attends Banker Meetings:   Marland Kitchen Marital Status:   Intimate Partner Violence:   . Fear of Current or Ex-Partner:   . Emotionally Abused:   Marland Kitchen Physically Abused:   . Sexually Abused:     ALLERGIES:    No Known Allergies  CURRENT  MEDICATIONS:    Current Outpatient Medications  Medication Sig Dispense Refill  . acetaminophen (TYLENOL) 500 MG tablet Take 1,000 mg by mouth every 6 (six) hours as needed for headache (pain).    Marland Kitchen ALPRAZolam (XANAX) 1 MG tablet Take 1-2 tablets thirty minutes prior to MRI.  May take one additional tablet before entering scanner, if needed.  MUST HAVE DRIVER. 3 tablet 0  . atorvastatin (LIPITOR) 80 MG tablet TAKE 1 TABLET (80 MG TOTAL) BY MOUTH DAILY AT 6 PM. 90 tablet 3  . carvedilol (COREG) 6.25 MG tablet TAKE 1 TABLET (6.25 MG TOTAL) BY MOUTH 2 (TWO) TIMES DAILY WITH A MEAL. 180 tablet 2  . cholecalciferol (VITAMIN D3) 25 MCG (1000 UNIT) tablet Take 1,000 Units by mouth daily.    . Diclofenac Potassium,Migraine, (CAMBIA) 50 MG PACK Take 50 mg by mouth as needed. 12 each 11  . ezetimibe (ZETIA) 10 MG tablet Take 1 tablet (10 mg total) by mouth daily. 30 tablet 4  . fluticasone (FLONASE) 50 MCG/ACT nasal spray Place 2 sprays into both nostrils daily as needed for allergies or rhinitis.    Marland Kitchen levocetirizine (XYZAL) 5 MG tablet Take 1 tablet (5 mg total) by mouth daily. 90 tablet 1  . Magnesium 250 MG TABS TAKE 1 TABLET BY MOUTH DAILY WITH EVENING MEALS (Patient taking differently: Take 250 mg by mouth daily as needed (low magnesium). ) 30 tablet 2  . meloxicam (MOBIC) 15 MG tablet Take 1 tablet (15 mg total) by mouth daily as needed for pain. 30 tablet 6  . nitroGLYCERIN (NITROSTAT) 0.4 MG SL tablet Place 1 tablet (0.4 mg total) under the tongue every 5 (five) minutes as needed for chest pain. 30 tablet 1  . olmesartan-hydrochlorothiazide (BENICAR HCT) 40-25 MG tablet Take 1 tablet by mouth daily. 90 tablet 0  . tiZANidine (ZANAFLEX) 4 MG tablet Take 1 tablet (4 mg total) by mouth every 8 (eight) hours as needed for muscle spasms. 30 tablet 0  . topiramate (TOPAMAX) 100 MG tablet Take 1 tablet (100 mg total) by mouth at bedtime. 90 tablet 4  . traMADol (ULTRAM) 50 MG tablet TAKE 1 TABLET (50 MG  TOTAL) BY MOUTH EVERY 6 (SIX) HOURS AS NEEDED. 30 tablet 0   No current facility-administered medications for this visit.    REVIEW OF SYSTEMS:   [X]  denotes positive finding, [ ]  denotes negative finding Cardiac  Comments:  Chest pain or chest  pressure:    Shortness of breath upon exertion: x   Short of breath when lying flat:    Irregular heart rhythm:        Vascular    Pain in calf, thigh, or hip brought on by ambulation: x   Pain in feet at night that wakes you up from your sleep:  x   Blood clot in your veins:    Leg swelling:         Pulmonary    Oxygen at home:    Productive cough:     Wheezing:         Neurologic    Sudden weakness in arms or legs:     Sudden numbness in arms or legs:  x   Sudden onset of difficulty speaking or slurred speech:    Temporary loss of vision in one eye:     Problems with dizziness:         Gastrointestinal    Blood in stool:      Vomited blood:         Genitourinary    Burning when urinating:     Blood in urine:        Psychiatric    Major depression:         Hematologic    Bleeding problems:    Problems with blood clotting too easily:        Skin    Rashes or ulcers:        Constitutional    Fever or chills:     PHYSICAL EXAM:   Vitals:   08/20/19 0944 08/20/19 0945  BP: 107/72 118/77  Pulse: 64   Resp: 20   Temp: 97.8 F (36.6 C)   SpO2: 97%   Weight: 189 lb (85.7 kg)   Height: 5\' 1"  (1.549 m)     GENERAL: The patient is a well-nourished female, in no acute distress. The vital signs are documented above. CARDIAC: There is a regular rate and rhythm.  VASCULAR: palpable DP PULMONARY: Nonlabored respirations ABDOMEN: Soft and non-tender with normal pitched bowel sounds.  MUSCULOSKELETAL: There are no major deformities or cyanosis. NEUROLOGIC: No focal weakness or paresthesias are detected. SKIN: There are no ulcers or rashes noted. PSYCHIATRIC: The patient has a normal affect.  STUDIES:   I have  reviewed the following:  ANgio: Angiographically occluded right internal carotid artery at the bulb without evidence of an angiographic string sign proximally.  Distal partial reconstitution of the right internal carotid artery cavernous and supraclinoid segment via the ipsilateral ophthalmic artery from the right external carotid artery branches.  Prompt retrograde opacification via the right posterior communicating artery of the distal right internal carotid artery and the right middle cerebral artery from the posterior circulation as described.  Approximately 30% stenosis of the left internal carotid artery at the bulb by the NASCET criteria. No evidence of acute ulcerations or of intraluminal filling defects is seen.  No gross evidence of vascular malformations, or of aneurysms or of dissection or of arteriovenous shunting. ASSESSMENT and PLAN   Occluded right carotid artery, confirmed by angiography.  The patient has approximately 30% stenosis on the left side.  I discussed with the patient that no intervention is recommended for her occluded right carotid artery.  We will need to closely monitor her left side for progression.  She would require intervention if the stenosis was greater than 80%.  I have her scheduled for follow-up in 1 year with repeat carotid duplex.  Leia Alf, MD, FACS [facility] [phone] Pager [phone]

## 2019-08-21 ENCOUNTER — Ambulatory Visit (INDEPENDENT_AMBULATORY_CARE_PROVIDER_SITE_OTHER): Payer: 59 | Admitting: Nurse Practitioner

## 2019-08-21 ENCOUNTER — Encounter: Payer: Self-pay | Admitting: Nurse Practitioner

## 2019-08-21 VITALS — BP 128/76 | HR 77 | Temp 98.1°F | Ht 61.0 in | Wt 192.0 lb

## 2019-08-21 DIAGNOSIS — I10 Essential (primary) hypertension: Secondary | ICD-10-CM

## 2019-08-21 DIAGNOSIS — R519 Headache, unspecified: Secondary | ICD-10-CM

## 2019-08-21 DIAGNOSIS — I609 Nontraumatic subarachnoid hemorrhage, unspecified: Secondary | ICD-10-CM | POA: Diagnosis not present

## 2019-08-21 DIAGNOSIS — L2489 Irritant contact dermatitis due to other agents: Secondary | ICD-10-CM

## 2019-08-21 DIAGNOSIS — L282 Other prurigo: Secondary | ICD-10-CM

## 2019-08-21 MED ORDER — MOMETASONE FUROATE 0.1 % EX CREA: TOPICAL_CREAM | CUTANEOUS | 1 refills | Status: DC

## 2019-08-21 MED ORDER — MAGNESIUM 250 MG PO TABS: ORAL_TABLET | ORAL | 2 refills | Status: DC

## 2019-08-21 MED ORDER — HYDROCODONE-ACETAMINOPHEN 5-325 MG PO TABS: 1.0000 | ORAL_TABLET | Freq: Four times a day (QID) | ORAL | 0 refills | Status: DC | PRN

## 2019-08-21 MED ORDER — HYDROXYZINE HCL 25 MG PO TABS: 25.0000 mg | ORAL_TABLET | Freq: Three times a day (TID) | ORAL | 0 refills | Status: DC | PRN

## 2019-08-21 MED ORDER — LEVOCETIRIZINE DIHYDROCHLORIDE 5 MG PO TABS: 5.0000 mg | ORAL_TABLET | Freq: Every day | ORAL | 1 refills | Status: DC

## 2019-08-21 MED ORDER — OLMESARTAN-AMLODIPINE-HCTZ 20-5-12.5 MG PO TABS: 1.0000 | ORAL_TABLET | Freq: Every day | ORAL | 1 refills | Status: DC

## 2019-08-21 NOTE — Progress Notes (Signed)
I,Katawbba Wiggins,acting as a Neurosurgeon for [employer], FNP.,have documented all relevant documentation on the behalf of Arnette Felts, FNP,as directed by  Arnette Felts, FNP while in the presence of Arnette Felts, FNP.  This visit occurred during the SARS-CoV-2 public health emergency.  Safety protocols were in place, including screening questions prior to the visit, additional usage of staff PPE, and extensive cleaning of exam room while observing appropriate contact time as indicated for disinfecting solutions.  Subjective:     Patient ID: [name] , female    DOB: 10/07/1960 , 59 y.o.   MRN: [id number]   Chief Complaint  Patient presents with  . Hospitalization Follow-up    HPI  The patient is here today for a hospital follow-up. She reports her blood pressure has been up and down, they had wanted her blood pressure to remain above 140/70 but at home has been as low as 95/60 (she will get light headed).    Her appt with neurology is on Aug 24th. She was unable to move her left side of her face and arm. She was unable to move her left side and left leg weak. She did not fall. Denies having a fall.   She continues to have a headache occasionally on the right side of her head.  She works for [employer] in Clinical biochemist. She is working now went today but seemed tired and feels like her headset caused her head discomfort.  This morning she was unable to get to work on time. She is not sleeping well.    imaging showed she has small insular infarcts with right ICA occlusion and subarachnoid hemorrhage as well as high-grade internal carotid artery stenosis on the left.  She is to be followed by Dr. Terrace Arabia    Past Medical History:  Diagnosis Date  . Allergy   . Arthritis    back   . Atypical chest pain 06/30/2016  . Back pain    DUE TO INJURY AT WORK ON05/2013  . Bruises easily   . Chronic lower back pain   . Coronary artery disease   . Depression    was on meds 2 yrs ago   .  Headache(784.0)    rarely  . History of bronchitis    last time about 80yrs ago   . Hypertension    takes Benicar daily  . Hypothyroidism 06/30/2016  . Pure hypercholesterolemia 05/31/2019  . Vitamin D deficiency    takes Vitamin D 2 times/wk  . Weakness    and numbness right foot     Family History  Problem Relation Age of Onset  . Heart disease Mother   . CAD Mother   . Stroke Mother   . Heart disease Sister   . Heart attack Sister   . Liver disease Brother   . Other Father        unknown medical history  . CAD Brother   . Colon cancer Neg Hx   . Esophageal cancer Neg Hx   . Rectal cancer Neg Hx   . Stomach cancer Neg Hx      Current Outpatient Medications:  .  acetaminophen (TYLENOL) 500 MG tablet, Take 1,000 mg by mouth every 6 (six) hours as needed for headache (pain)., Disp: , Rfl:  .  ALPRAZolam (XANAX) 1 MG tablet, Take 1-2 tablets thirty minutes prior to MRI.  May take one additional tablet before entering scanner, if needed.  MUST HAVE DRIVER., Disp: 3 tablet, Rfl: 0 .  atorvastatin (LIPITOR) 80 MG tablet, TAKE 1 TABLET (80 MG TOTAL) BY MOUTH DAILY AT 6 PM., Disp: 90 tablet, Rfl: 3 .  carvedilol (COREG) 6.25 MG tablet, TAKE 1 TABLET (6.25 MG TOTAL) BY MOUTH 2 (TWO) TIMES DAILY WITH A MEAL., Disp: 180 tablet, Rfl: 2 .  cholecalciferol (VITAMIN D3) 25 MCG (1000 UNIT) tablet, Take 1,000 Units by mouth daily., Disp: , Rfl:  .  Diclofenac Potassium,Migraine, (CAMBIA) 50 MG PACK, Take 50 mg by mouth as needed., Disp: 12 each, Rfl: 11 .  ezetimibe (ZETIA) 10 MG tablet, Take 1 tablet (10 mg total) by mouth daily., Disp: 30 tablet, Rfl: 4 .  fluticasone (FLONASE) 50 MCG/ACT nasal spray, Place 2 sprays into both nostrils daily as needed for allergies or rhinitis., Disp: , Rfl:  .  HYDROcodone-acetaminophen (NORCO/VICODIN) 5-325 MG tablet, Take 1 tablet by mouth every 6 (six) hours as needed for moderate pain., Disp: 30 tablet, Rfl: 0 .  hydrOXYzine (ATARAX/VISTARIL) 25 MG  tablet, Take 1 tablet (25 mg total) by mouth 3 (three) times daily as needed., Disp: 30 tablet, Rfl: 0 .  levocetirizine (XYZAL) 5 MG tablet, Take 1 tablet (5 mg total) by mouth daily., Disp: 90 tablet, Rfl: 1 .  Magnesium 250 MG TABS, TAKE 1 TABLET BY MOUTH DAILY WITH EVENING MEALS, Disp: 30 tablet, Rfl: 2 .  meloxicam (MOBIC) 15 MG tablet, Take 1 tablet (15 mg total) by mouth daily as needed for pain., Disp: 30 tablet, Rfl: 6 .  mometasone (ELOCON) 0.1 % cream, Apply to affected area daily, Disp: 45 g, Rfl: 1 .  nitroGLYCERIN (NITROSTAT) 0.4 MG SL tablet, Place 1 tablet (0.4 mg total) under the tongue every 5 (five) minutes as needed for chest pain., Disp: 30 tablet, Rfl: 1 .  Olmesartan-amLODIPine-HCTZ 20-5-12.5 MG TABS, Take 1 tablet by mouth daily., Disp: 90 tablet, Rfl: 1 .  tiZANidine (ZANAFLEX) 4 MG tablet, Take 1 tablet (4 mg total) by mouth every 8 (eight) hours as needed for muscle spasms., Disp: 30 tablet, Rfl: 0 .  topiramate (TOPAMAX) 100 MG tablet, Take 1 tablet (100 mg total) by mouth at bedtime., Disp: 90 tablet, Rfl: 4   No Known Allergies   Review of Systems  Constitutional: Negative.   Respiratory: Negative.   Cardiovascular: Negative.   Gastrointestinal: Negative.   Neurological: Positive for headaches (with the headset on at work). Negative for dizziness.  Psychiatric/Behavioral: Negative.      Today's Vitals   08/21/19 1551  BP: 128/76  Pulse: 77  Temp: 98.1 F (36.7 C)  TempSrc: Oral  Weight: 192 lb (87.1 kg)  Height: 5\' 1"  (1.549 m)   Body mass index is 36.28 kg/m.   Objective:  Physical Exam Vitals and nursing note reviewed.  Constitutional:      Appearance: Normal appearance. She is obese.  HENT:     Head: Normocephalic and atraumatic.  Cardiovascular:     Rate and Rhythm: Normal rate and regular rhythm.     Heart sounds: Normal heart sounds.  Pulmonary:     Breath sounds: Normal breath sounds.  Skin:    General: Skin is warm.     Capillary  Refill: Capillary refill takes less than 2 seconds.  Neurological:     General: No focal deficit present.     Mental Status: She is alert and oriented to person, place, and time.     Cranial Nerves: No cranial nerve deficit.     Sensory: No sensory deficit.     Motor:  No weakness.     Coordination: Coordination abnormal (mild dysmetria).  Psychiatric:        Mood and Affect: Mood normal.        Behavior: Behavior normal.        Thought Content: Thought content normal.        Judgment: Judgment normal.         Assessment And Plan:     1. SAH (subarachnoid hemorrhage) (HCC)  She was admitted to the hospital 7/27-7/30 after having a SAH, she continues to have intermittent headaches and some fatigue.  Will refer to PT she has mild dysmetria  TCM Performed. A member of the clinical team spoke with the patient upon dischare. Discharge summary was reviewed in full detail during the visit. Meds reconciled and compared to discharge meds. Medication list is updated and reviewed with the patient.  Greater than 50% face to face time was spent in counseling an coordination of care.  All questions were answered to the satisfaction of the patient.   - Ambulatory referral to Physical Therapy  2. Acute nonintractable headache, unspecified headache type  She is to take norco as needed for headache   She is encouraged to try tylenol first  Reviewed PDMP - HYDROcodone-acetaminophen (NORCO/VICODIN) 5-325 MG tablet; Take 1 tablet by mouth every 6 (six) hours as needed for moderate pain.  Dispense: 30 tablet; Refill: 0  3. Essential hypertension, benign  Chronic, blood pressure is controlled - Olmesartan-amLODIPine-HCTZ 20-5-12.5 MG TABS; Take 1 tablet by mouth daily.  Dispense: 90 tablet; Refill: 1  4. Pruritic rash  She is having itching where she had tape from her IV  Will provide anti itch medication - hydrOXYzine (ATARAX/VISTARIL) 25 MG tablet; Take 1 tablet (25 mg total) by mouth 3  (three) times daily as needed.  Dispense: 30 tablet; Refill: 0  5. Irritant contact dermatitis due to other agents  Eczema type rash to bilateral arms  Treat with steroid cream  - hydrOXYzine (ATARAX/VISTARIL) 25 MG tablet; Take 1 tablet (25 mg total) by mouth 3 (three) times daily as needed.  Dispense: 30 tablet; Refill: 0 - mometasone (ELOCON) 0.1 % cream; Apply to affected area daily  Dispense: 45 g; Refill: 1    Patient was given opportunity to ask questions. Patient verbalized understanding of the plan and was able to repeat key elements of the plan. All questions were answered to their satisfaction.  Arnette Felts, FNP   I, Arnette Felts, FNP, have reviewed all documentation for this visit. The documentation on 08/29/19 for the exam, diagnosis, procedures, and orders are all accurate and complete.  THE PATIENT IS ENCOURAGED TO PRACTICE SOCIAL DISTANCING DUE TO THE COVID-19 PANDEMIC.

## 2019-09-04 ENCOUNTER — Telehealth: Payer: Self-pay

## 2019-09-04 ENCOUNTER — Ambulatory Visit: Payer: 59

## 2019-09-04 DIAGNOSIS — I609 Nontraumatic subarachnoid hemorrhage, unspecified: Secondary | ICD-10-CM | POA: Diagnosis not present

## 2019-09-04 DIAGNOSIS — I639 Cerebral infarction, unspecified: Secondary | ICD-10-CM

## 2019-09-04 NOTE — Telephone Encounter (Signed)
Patient called wanting to know if she should keep her appointment on 09/11/2019 for left hip, cortisone injection.? Stated that her left hip was doing okay until she had a stroke.  Would like a CB to discuss?  Cb# [phone].  Please advise.  Thank you.

## 2019-09-05 ENCOUNTER — Telehealth: Payer: Self-pay | Admitting: *Deleted

## 2019-09-05 NOTE — Telephone Encounter (Signed)
Pls advise.  

## 2019-09-05 NOTE — Telephone Encounter (Signed)
Pt called, she needs the [place] form updated by today for her stroke that she had in July. Please call [phone]

## 2019-09-05 NOTE — Telephone Encounter (Signed)
IC s/w patient and advised  

## 2019-09-05 NOTE — Telephone Encounter (Signed)
Her history of stroke was added to the [facility] forms. Migraines are already noted on the paperwork. Since her deadline is today, Dr. Epimenio Foot will sign due to Dr. Terrace Arabia being out the office this week. Returned to medical records.

## 2019-09-05 NOTE — Telephone Encounter (Signed)
Ok to keep appointment.  Will need to make sure hilts is also in office that day

## 2019-09-06 ENCOUNTER — Telehealth: Payer: Self-pay | Admitting: *Deleted

## 2019-09-06 NOTE — Telephone Encounter (Signed)
-----   Message from Anson Fret, MD sent at 09/06/2019 12:37 PM EDT ----- I looked at it just looks she moved a little, nothing concerning whatsoever.

## 2019-09-06 NOTE — Telephone Encounter (Signed)
I called the patient back and reviewed the information below. She verbalized understanding.

## 2019-09-10 ENCOUNTER — Telehealth: Payer: Self-pay | Admitting: Neurology

## 2019-09-10 NOTE — Telephone Encounter (Signed)
I spoke to the patient. This medicine was previously ordered by cardiology. She is no longer under their care. She is actually going to discuss refills with her PCP, Dr. Allyne Gee.

## 2019-09-10 NOTE — Telephone Encounter (Signed)
Pt would like to know if Dr. Terrace Arabia can refill this atorvastatin (LIPITOR) 80 MG tablet was prescribed by a another physician no longer seeing. Pt would like a call from the nurse.

## 2019-09-11 ENCOUNTER — Encounter: Payer: Self-pay | Admitting: Orthopaedic Surgery

## 2019-09-11 ENCOUNTER — Ambulatory Visit (INDEPENDENT_AMBULATORY_CARE_PROVIDER_SITE_OTHER): Payer: 59 | Admitting: Orthopaedic Surgery

## 2019-09-11 ENCOUNTER — Ambulatory Visit: Payer: Self-pay

## 2019-09-11 DIAGNOSIS — M1612 Unilateral primary osteoarthritis, left hip: Secondary | ICD-10-CM | POA: Insufficient documentation

## 2019-09-11 NOTE — Progress Notes (Signed)
Office Visit Note   Patient: [name]           Date of Birth: [birth year]           MRN: [id number] Visit Date: 09/11/2019              Requested by: Dorothyann Peng, MD [address] [place],  [place] [address] PCP: Dorothyann Peng, MD   Assessment & Plan: Visit Diagnoses:  1. Primary osteoarthritis of left hip     Plan: Impression is left hip DJD with prior relief from cortisone injection.  We will repeat this today with Dr. Prince Rome.  Follow-up as needed.  Follow-Up Instructions: Return if symptoms worsen or fail to improve.   Orders:  No orders of the defined types were placed in this encounter.  No orders of the defined types were placed in this encounter.     Procedures: No procedures performed   Clinical Data: No additional findings.   Subjective: Chief Complaint  Patient presents with  . Left Hip - Pain    [name] is a very pleasant 59 year old female who follows up today for chronic left hip pain due to DJD.  Unfortunately she had a stroke about a month ago but fortunately this left her with no permanent deficits.  She would like to repeat the injection as the first 1 gave her significant relief.  She is looking at possibly having the hip replacement towards the end of the year.  No changes in medical history otherwise.   Review of Systems   Objective: Vital Signs: LMP 06/03/2011   Physical Exam  Ortho Exam Left hip exam is unchanged. Specialty Comments:  No specialty comments available.  Imaging: No results found.   PMFS History: Patient Active Problem List   Diagnosis Date Noted  . Primary osteoarthritis of left hip 09/11/2019  . SAH (subarachnoid hemorrhage) (HCC) 08/07/2019  . Acute embolic stroke (HCC)   . Left hip pain 06/25/2019  . Severe left groin pain 06/21/2019  . Bilateral carotid artery stenosis 06/21/2019  . Pure hypercholesterolemia 05/31/2019  . CAD in native artery 04/09/2019  . Complicated migraine  08/23/2018  . Hyperglycemia 07/10/2018  . Abnormal stress test 08/06/2016  . Essential hypertension 06/30/2016  . Hypothyroidism 06/30/2016   Past Medical History:  Diagnosis Date  . Allergy   . Arthritis    back   . Atypical chest pain 06/30/2016  . Back pain    DUE TO INJURY AT WORK ON05/2013  . Bruises easily   . Chronic lower back pain   . Coronary artery disease   . Depression    was on meds 2 yrs ago   . Headache(784.0)    rarely  . History of bronchitis    last time about 38yrs ago   . Hypertension    takes Benicar daily  . Hypothyroidism 06/30/2016  . Pure hypercholesterolemia 05/31/2019  . Vitamin D deficiency    takes Vitamin D 2 times/wk  . Weakness    and numbness right foot    Family History  Problem Relation Age of Onset  . Heart disease Mother   . CAD Mother   . Stroke Mother   . Heart disease Sister   . Heart attack Sister   . Liver disease Brother   . Other Father        unknown medical history  . CAD Brother   . Colon cancer Neg Hx   . Esophageal cancer Neg Hx   .  Rectal cancer Neg Hx   . Stomach cancer Neg Hx     Past Surgical History:  Procedure Laterality Date  . BACK SURGERY    . CARPAL TUNNEL RELEASE Right   . CESAREAN SECTION  1988  . COLONOSCOPY    . EXPLORATORY LAPAROTOMY  1991   "took out fatty tumor" (11/09/2012)  . IR ANGIO INTRA EXTRACRAN SEL COM CAROTID INNOMINATE BILAT MOD SED  08/10/2019  . IR ANGIO VERTEBRAL SEL VERTEBRAL BILAT MOD SED  08/10/2019  . IR US GUIDE VASC ACCESS RIGHT  08/10/2019  . LEFT HEART CATH AND CORONARY ANGIOGRAPHY N/A 08/06/2016   Procedure: Left Heart Cath and Coronary Angiography;  Surgeon: Lyn Records, MD;  Location: [facility] INVASIVE CV LAB;  Service: Cardiovascular;  Laterality: N/A;  . LEFT HEART CATH AND CORONARY ANGIOGRAPHY N/A 07/12/2018   Procedure: LEFT HEART CATH AND CORONARY ANGIOGRAPHY;  Surgeon: Marykay Lex, MD;  Location: [facility] INVASIVE CV LAB;  Service: Cardiovascular;  Laterality: N/A;  .  LUMBAR LAMINECTOMY/DECOMPRESSION MICRODISCECTOMY  11/09/2012   "L3-4" (11/09/2012)  . LUMBAR LAMINECTOMY/DECOMPRESSION MICRODISCECTOMY N/A 11/09/2012   Procedure: L3-L4 DECOMPRESSION AND MICRODISCECTOMY   (1 LEVEL);  Surgeon: Venita Lick, MD;  Location: [facility] OR;  Service: Orthopedics;  Laterality: N/A;   Social History   Occupational History  . Occupation: Control and instrumentation engineer  Tobacco Use  . Smoking status: Never Smoker  . Smokeless tobacco: Never Used  Vaping Use  . Vaping Use: Never used  Substance and Sexual Activity  . Alcohol use: Yes    Alcohol/week: 7.0 standard drinks    Types: 7 Glasses of wine per week    Comment: social  . Drug use: No  . Sexual activity: Yes    Birth control/protection: Post-menopausal

## 2019-09-11 NOTE — Progress Notes (Signed)
Subjective: Patient is here for ultrasound-guided intra-articular left hip injection.   Previous injection helped quite a bit for about 2 months.  She had a stroke in July and is recovering nicely from that, she has had increased hip pain since then.  Objective: Pain with internal rotation.  Procedure: Ultrasound-guided left hip injection: After sterile prep with Betadine, injected 8 cc 1% lidocaine without epinephrine and 40 mg methylprednisolone using a 22-gauge spinal needle, passing the needle through the iliofemoral ligament into the femoral head/neck junction.  Injectate was seen filling the joint capsule.  Good immediate relief.

## 2019-09-18 ENCOUNTER — Encounter: Payer: Self-pay | Admitting: Internal Medicine

## 2019-09-18 ENCOUNTER — Other Ambulatory Visit: Payer: Self-pay

## 2019-09-18 ENCOUNTER — Ambulatory Visit: Payer: 59 | Admitting: Internal Medicine

## 2019-09-18 VITALS — BP 122/78 | HR 72 | Temp 98.6°F | Ht 60.2 in | Wt 192.0 lb

## 2019-09-18 DIAGNOSIS — E559 Vitamin D deficiency, unspecified: Secondary | ICD-10-CM | POA: Diagnosis not present

## 2019-09-18 DIAGNOSIS — Z0000 Encounter for general adult medical examination without abnormal findings: Secondary | ICD-10-CM

## 2019-09-18 DIAGNOSIS — I609 Nontraumatic subarachnoid hemorrhage, unspecified: Secondary | ICD-10-CM | POA: Diagnosis not present

## 2019-09-18 DIAGNOSIS — Z23 Encounter for immunization: Secondary | ICD-10-CM

## 2019-09-18 DIAGNOSIS — I10 Essential (primary) hypertension: Secondary | ICD-10-CM | POA: Diagnosis not present

## 2019-09-18 DIAGNOSIS — Z79899 Other long term (current) drug therapy: Secondary | ICD-10-CM

## 2019-09-18 DIAGNOSIS — Z6837 Body mass index (BMI) 37.0-37.9, adult: Secondary | ICD-10-CM

## 2019-09-18 LAB — POCT URINALYSIS DIPSTICK
Bilirubin, UA: NEGATIVE
Glucose, UA: NEGATIVE
Ketones, UA: NEGATIVE
Nitrite, UA: NEGATIVE
Protein, UA: NEGATIVE
Spec Grav, UA: 1.015 (ref 1.010–1.025)
Urobilinogen, UA: 0.2 E.U./dL
pH, UA: 6 (ref 5.0–8.0)

## 2019-09-18 LAB — POCT UA - MICROALBUMIN
Albumin/Creatinine Ratio, Urine, POC: <30
Creatinine, POC: 200 mg/dL
Microalbumin Ur, POC: 10 mg/L

## 2019-09-18 MED ORDER — ATORVASTATIN CALCIUM 80 MG PO TABS: 80.0000 mg | ORAL_TABLET | Freq: Every day | ORAL | 2 refills | Status: DC

## 2019-09-18 NOTE — Patient Instructions (Signed)
Health Maintenance, Female Adopting a healthy lifestyle and getting preventive care are important in promoting health and wellness. Ask your health care provider about:  The right schedule for you to have regular tests and exams.  Things you can do on your own to prevent diseases and keep yourself healthy. What should I know about diet, weight, and exercise? Eat a healthy diet   Eat a diet that includes plenty of vegetables, fruits, low-fat dairy products, and lean protein.  Do not eat a lot of foods that are high in solid fats, added sugars, or sodium. Maintain a healthy weight Body mass index (BMI) is used to identify weight problems. It estimates body fat based on height and weight. Your health care provider can help determine your BMI and help you achieve or maintain a healthy weight. Get regular exercise Get regular exercise. This is one of the most important things you can do for your health. Most adults should:  Exercise for at least 150 minutes each week. The exercise should increase your heart rate and make you sweat (moderate-intensity exercise).  Do strengthening exercises at least twice a week. This is in addition to the moderate-intensity exercise.  Spend less time sitting. Even light physical activity can be beneficial. Watch cholesterol and blood lipids Have your blood tested for lipids and cholesterol at 59 years of age, then have this test every 5 years. Have your cholesterol levels checked more often if:  Your lipid or cholesterol levels are high.  You are older than 59 years of age.  You are at high risk for heart disease. What should I know about cancer screening? Depending on your health history and family history, you may need to have cancer screening at various ages. This may include screening for:  Breast cancer.  Cervical cancer.  Colorectal cancer.  Skin cancer.  Lung cancer. What should I know about heart disease, diabetes, and high blood  pressure? Blood pressure and heart disease  High blood pressure causes heart disease and increases the risk of stroke. This is more likely to develop in people who have high blood pressure readings, are of African descent, or are overweight.  Have your blood pressure checked: ? Every 3-5 years if you are 18-39 years of age. ? Every year if you are 40 years old or older. Diabetes Have regular diabetes screenings. This checks your fasting blood sugar level. Have the screening done:  Once every three years after age 40 if you are at a normal weight and have a low risk for diabetes.  More often and at a younger age if you are overweight or have a high risk for diabetes. What should I know about preventing infection? Hepatitis B If you have a higher risk for hepatitis B, you should be screened for this virus. Talk with your health care provider to find out if you are at risk for hepatitis B infection. Hepatitis C Testing is recommended for:  Everyone born from 1945 through 1965.  Anyone with known risk factors for hepatitis C. Sexually transmitted infections (STIs)  Get screened for STIs, including gonorrhea and chlamydia, if: ? You are sexually active and are younger than 59 years of age. ? You are older than 59 years of age and your health care provider tells you that you are at risk for this type of infection. ? Your sexual activity has changed since you were last screened, and you are at increased risk for chlamydia or gonorrhea. Ask your health care provider if   you are at risk.  Ask your health care provider about whether you are at high risk for HIV. Your health care provider may recommend a prescription medicine to help prevent HIV infection. If you choose to take medicine to prevent HIV, you should first get tested for HIV. You should then be tested every 3 months for as long as you are taking the medicine. Pregnancy  If you are about to stop having your period (premenopausal) and  you may become pregnant, seek counseling before you get pregnant.  Take 400 to 800 micrograms (mcg) of folic acid every day if you become pregnant.  Ask for birth control (contraception) if you want to prevent pregnancy. Osteoporosis and menopause Osteoporosis is a disease in which the bones lose minerals and strength with aging. This can result in bone fractures. If you are 65 years old or older, or if you are at risk for osteoporosis and fractures, ask your health care provider if you should:  Be screened for bone loss.  Take a calcium or vitamin D supplement to lower your risk of fractures.  Be given hormone replacement therapy (HRT) to treat symptoms of menopause. Follow these instructions at home: Lifestyle  Do not use any products that contain nicotine or tobacco, such as cigarettes, e-cigarettes, and chewing tobacco. If you need help quitting, ask your health care provider.  Do not use street drugs.  Do not share needles.  Ask your health care provider for help if you need support or information about quitting drugs. Alcohol use  Do not drink alcohol if: ? Your health care provider tells you not to drink. ? You are pregnant, may be pregnant, or are planning to become pregnant.  If you drink alcohol: ? Limit how much you use to 0-1 drink a day. ? Limit intake if you are breastfeeding.  Be aware of how much alcohol is in your drink. In the [place] one drink equals one 12 oz bottle of beer (355 mL), one 5 oz glass of wine (148 mL), or one 1 oz glass of hard liquor (44 mL). General instructions  Schedule regular health, dental, and eye exams.  Stay current with your vaccines.  Tell your health care provider if: ? You often feel depressed. ? You have ever been abused or do not feel safe at home. Summary  Adopting a healthy lifestyle and getting preventive care are important in promoting health and wellness.  Follow your health care provider's instructions about healthy  diet, exercising, and getting tested or screened for diseases.  Follow your health care provider's instructions on monitoring your cholesterol and blood pressure. This information is not intended to replace advice given to you by your health care provider. Make sure you discuss any questions you have with your health care provider. Document Revised: 12/21/2017 Document Reviewed: 12/21/2017 Elsevier Patient Education  2020 Elsevier Inc.  

## 2019-09-18 NOTE — Progress Notes (Signed)
I,Katawbba Wiggins,acting as a Education administrator for Maximino Greenland, MD.,have documented all relevant documentation on the behalf of Maximino Greenland, MD,as directed by  Maximino Greenland, MD while in the presence of Maximino Greenland, MD.  This visit occurred during the SARS-CoV-2 public health emergency.  Safety protocols were in place, including screening questions prior to the visit, additional usage of staff PPE, and extensive cleaning of exam room while observing appropriate contact time as indicated for disinfecting solutions.  Subjective:     Patient ID: [name] , female    DOB: May 07, 1960 , 59 y.o.   MRN: [id number]   Chief Complaint  Patient presents with  . Annual Exam  . Hypertension    HPI  She is here today for a full physical examination. She is followed by GYN for her pelvic examinations.  She needs paperwork completed for work. She works a second job, and does not yet have energy since having stroke July 2021. She is still working on getting her energy back.   Hypertension This is a chronic problem. The current episode started more than 1 year ago. The problem has been gradually improving since onset. The problem is controlled. Pertinent negatives include no blurred vision, chest pain, palpitations or shortness of breath. Risk factors for coronary artery disease include obesity, post-menopausal state, sedentary lifestyle and stress. Past treatments include angiotensin blockers, diuretics and beta blockers. The current treatment provides moderate improvement.     Past Medical History:  Diagnosis Date  . Allergy   . Arthritis    back   . Atypical chest pain 06/30/2016  . Back pain    DUE TO INJURY AT WORK ON05/2013  . Bruises easily   . Chronic lower back pain   . Coronary artery disease   . Depression    was on meds 2 yrs ago   . Headache(784.0)    rarely  . History of bronchitis    last time about 85yr ago   . Hypertension    takes Benicar daily  . Hypothyroidism  06/30/2016  . Pure hypercholesterolemia 05/31/2019  . Vitamin D deficiency    takes Vitamin D 2 times/wk  . Weakness    and numbness right foot     Family History  Problem Relation Age of Onset  . Heart disease Mother   . CAD Mother   . Stroke Mother   . Heart disease Sister   . Heart attack Sister   . Liver disease Brother   . Other Father        unknown medical history  . CAD Brother   . Colon cancer Neg Hx   . Esophageal cancer Neg Hx   . Rectal cancer Neg Hx   . Stomach cancer Neg Hx      Current Outpatient Medications:  .  acetaminophen (TYLENOL) 500 MG tablet, Take 1,000 mg by mouth every 6 (six) hours as needed for headache (pain)., Disp: , Rfl:  .  atorvastatin (LIPITOR) 80 MG tablet, Take 1 tablet (80 mg total) by mouth daily at 6 PM., Disp: 90 tablet, Rfl: 2 .  carvedilol (COREG) 6.25 MG tablet, TAKE 1 TABLET (6.25 MG TOTAL) BY MOUTH 2 (TWO) TIMES DAILY WITH A MEAL., Disp: 180 tablet, Rfl: 2 .  ezetimibe (ZETIA) 10 MG tablet, Take 1 tablet (10 mg total) by mouth daily., Disp: 30 tablet, Rfl: 4 .  fluticasone (FLONASE) 50 MCG/ACT nasal spray, Place 2 sprays into both nostrils daily as needed for allergies  or rhinitis., Disp: , Rfl:  .  HYDROcodone-acetaminophen (NORCO/VICODIN) 5-325 MG tablet, Take 1 tablet by mouth every 6 (six) hours as needed for moderate pain., Disp: 30 tablet, Rfl: 0 .  levocetirizine (XYZAL) 5 MG tablet, Take 1 tablet (5 mg total) by mouth daily., Disp: 90 tablet, Rfl: 1 .  Magnesium 250 MG TABS, TAKE 1 TABLET BY MOUTH DAILY WITH EVENING MEALS, Disp: 30 tablet, Rfl: 2 .  meloxicam (MOBIC) 15 MG tablet, Take 1 tablet (15 mg total) by mouth daily as needed for pain., Disp: 30 tablet, Rfl: 6 .  mometasone (ELOCON) 0.1 % cream, Apply to affected area daily, Disp: 45 g, Rfl: 1 .  Olmesartan-amLODIPine-HCTZ 20-5-12.5 MG TABS, Take 1 tablet by mouth daily., Disp: 90 tablet, Rfl: 1 .  tiZANidine (ZANAFLEX) 4 MG tablet, Take 1 tablet (4 mg total) by mouth  every 8 (eight) hours as needed for muscle spasms., Disp: 30 tablet, Rfl: 0 .  topiramate (TOPAMAX) 100 MG tablet, Take 1 tablet (100 mg total) by mouth at bedtime., Disp: 90 tablet, Rfl: 4 .  cholecalciferol (VITAMIN D3) 25 MCG (1000 UNIT) tablet, Take 1,000 Units by mouth daily. (Patient not taking: Reported on 09/18/2019), Disp: , Rfl:  .  Diclofenac Potassium,Migraine, (CAMBIA) 50 MG PACK, Take 50 mg by mouth as needed. (Patient not taking: Reported on 09/18/2019), Disp: 12 each, Rfl: 11 .  hydrOXYzine (ATARAX/VISTARIL) 25 MG tablet, Take 1 tablet (25 mg total) by mouth 3 (three) times daily as needed., Disp: 30 tablet, Rfl: 0 .  nitroGLYCERIN (NITROSTAT) 0.4 MG SL tablet, Place 1 tablet (0.4 mg total) under the tongue every 5 (five) minutes as needed for chest pain. (Patient not taking: Reported on 09/18/2019), Disp: 30 tablet, Rfl: 1   No Known Allergies    The patient states she uses post menopausal status for birth control. Last LMP was Patient's last menstrual period was 06/03/2011.. Negative for Dysmenorrhea. Negative for: breast discharge, breast lump(s), breast pain and breast self exam. Associated symptoms include abnormal vaginal bleeding. Pertinent negatives include abnormal bleeding (hematology), anxiety, decreased libido, depression, difficulty falling sleep, dyspareunia, history of infertility, nocturia, sexual dysfunction, sleep disturbances, urinary incontinence, urinary urgency, vaginal discharge and vaginal itching. Diet regular.The patient states her exercise level is    . The patient's tobacco use is:  Social History   Tobacco Use  Smoking Status Never Smoker  Smokeless Tobacco Never Used  . She has been exposed to passive smoke.  Review of Systems  Constitutional: Negative.   HENT: Negative.   Eyes: Negative.  Negative for blurred vision.  Respiratory: Negative.  Negative for shortness of breath.   Cardiovascular: Negative.  Negative for chest pain and palpitations.   Gastrointestinal: Negative.   Endocrine: Negative.   Genitourinary: Negative.   Musculoskeletal: Negative.   Skin: Negative.   Allergic/Immunologic: Negative.   Neurological: Negative.   Hematological: Negative.   Psychiatric/Behavioral: Negative.      Today's Vitals   09/18/19 1524  BP: 122/78  Pulse: 72  Temp: 98.6 F (37 C)  TempSrc: Oral  SpO2: 98%  Weight: 192 lb (87.1 kg)  Height: 5' 0.2" (1.529 m)   Body mass index is 37.25 kg/m.  Wt Readings from Last 3 Encounters:  09/18/19 192 lb (87.1 kg)  08/21/19 192 lb (87.1 kg)  08/20/19 189 lb (85.7 kg)   Objective:  Physical Exam Vitals and nursing note reviewed.  Constitutional:      General: She is not in acute distress.  Appearance: Normal appearance. She is well-developed. She is obese.  HENT:     Head: Normocephalic and atraumatic.     Right Ear: Hearing, tympanic membrane, ear canal and external ear normal. There is no impacted cerumen.     Left Ear: Hearing, tympanic membrane, ear canal and external ear normal. There is no impacted cerumen.     Nose:     Comments: Deferred, masked    Mouth/Throat:     Comments: Deferred, masked Eyes:     General: Lids are normal.     Extraocular Movements: Extraocular movements intact.     Conjunctiva/sclera: Conjunctivae normal.     Pupils: Pupils are equal, round, and reactive to light.     Funduscopic exam:    Right eye: No papilledema.        Left eye: No papilledema.  Neck:     Thyroid: No thyroid mass.     Vascular: No carotid bruit.  Cardiovascular:     Rate and Rhythm: Normal rate and regular rhythm.     Pulses: Normal pulses.     Heart sounds: Normal heart sounds. No murmur heard.   Pulmonary:     Effort: Pulmonary effort is normal.     Breath sounds: Normal breath sounds.  Chest:     Breasts: Tanner Score is 5.        Right: Normal.        Left: Normal.  Abdominal:     General: Bowel sounds are normal. There is no distension.     Palpations:  Abdomen is soft.     Tenderness: There is no abdominal tenderness.     Comments: Rounded, soft  Genitourinary:    Rectum: Guaiac result negative.  Musculoskeletal:        General: No swelling. Normal range of motion.     Cervical back: Full passive range of motion without pain, normal range of motion and neck supple.     Right lower leg: No edema.     Left lower leg: No edema.  Skin:    General: Skin is warm and dry.     Capillary Refill: Capillary refill takes less than 2 seconds.  Neurological:     General: No focal deficit present.     Mental Status: She is alert and oriented to person, place, and time.     Cranial Nerves: No cranial nerve deficit.     Sensory: No sensory deficit.  Psychiatric:        Mood and Affect: Mood normal.        Behavior: Behavior normal.        Thought Content: Thought content normal.        Judgment: Judgment normal.         Assessment And Plan:     1. Routine general medical examination at health care facility Comments: A full exam was performed. Importance of monthly self breast exams was discussed with the patient. PATIENT IS ADVISED TO GET 30-45 MINUTES REGULAR EXERCISE NO LESS THAN FOUR TO FIVE DAYS PER WEEK - BOTH WEIGHTBEARING EXERCISES AND AEROBIC ARE RECOMMENDED.  PATIENT IS ADVISED TO FOLLOW A HEALTHY DIET WITH AT LEAST SIX FRUITS/VEGGIES PER DAY, DECREASE INTAKE OF RED MEAT, AND TO INCREASE FISH INTAKE TO TWO DAYS PER WEEK.  MEATS/FISH SHOULD NOT BE FRIED, BAKED OR BROILED IS PREFERABLE.  I SUGGEST WEARING SPF 50 SUNSCREEN ON EXPOSED PARTS AND ESPECIALLY WHEN IN THE DIRECT SUNLIGHT FOR AN EXTENDED PERIOD OF TIME.  PLEASE AVOID FAST  FOOD RESTAURANTS AND INCREASE YOUR WATER INTAKE.  - Hemoglobin A1c - Hepatitis C antibody - CBC - CMP14+EGFR  2. Essential hypertension, benign Comments: Chronic, well controlled. She will continue with current meds. She is encouraged to avoid adding salt to her foods. EKG performed, NSR w/o acute changes. She  wil rto in 4-6 months for re-evaluation.  - POCT Urinalysis Dipstick (81002) - POCT UA - Microalbumin - EKG 12-Lead  3. SAH (subarachnoid hemorrhage) [facility]) Comments: Hospital d/c summary reviewed in full detail. Repeat MRI brain reviewed in full detail. Paperwork completed in its entirety during her visit. She will rto Nov 1st. She agrees to f/u at end of October for clearance.   4. Vitamin D deficiency disease  I will check vitamin D level and supplement as needed.   - VITAMIN D 25 Hydroxy (Vit-D Deficiency, Fractures)  5. Class 2 severe obesity due to excess calories with serious comorbidity and body mass index (BMI) of 37.0 to 37.9 in adult [facility]) Comments: She is encouraged to strive for BMI less than 30 to decrease cardiac risk. Advised to gradually work up to 150 minutes exercise/week.   6. Drug therapy - Vitamin B12  7. Need for vaccination Comments: She was given fliu vaccine to update her immunization history.  - Flu Vaccine QUAD 6+ mos PF IM (Fluarix Quad PF)    Patient was given opportunity to ask questions. Patient verbalized understanding of the plan and was able to repeat key elements of the plan. All questions were answered to their satisfaction.   Maximino Greenland, MD   I, Maximino Greenland, MD, have reviewed all documentation for this visit. The documentation on 09/18/19 for the exam, diagnosis, procedures, and orders are all accurate and complete.  THE PATIENT IS ENCOURAGED TO PRACTICE SOCIAL DISTANCING DUE TO THE COVID-19 PANDEMIC.

## 2019-09-19 LAB — VITAMIN D 25 HYDROXY (VIT D DEFICIENCY, FRACTURES): Vit D, 25-Hydroxy: 33.7 ng/mL (ref 30.0–100.0)

## 2019-09-19 LAB — CMP14+EGFR
ALT: 47 IU/L — ABNORMAL HIGH (ref 0–32)
AST: 19 IU/L (ref 0–40)
Albumin/Globulin Ratio: 1.5 (ref 1.2–2.2)
Albumin: 4.7 g/dL (ref 3.8–4.9)
Alkaline Phosphatase: 89 IU/L (ref 48–121)
BUN/Creatinine Ratio: 21 (ref 9–23)
BUN: 21 mg/dL (ref 6–24)
Bilirubin Total: 0.4 mg/dL (ref 0.0–1.2)
CO2: 23 mmol/L (ref 20–29)
Calcium: 9.9 mg/dL (ref 8.7–10.2)
Chloride: 97 mmol/L (ref 96–106)
Creatinine, Ser: 1.01 mg/dL — ABNORMAL HIGH (ref 0.57–1.00)
GFR calc Af Amer: 70 mL/min/{1.73_m2} (ref >59)
GFR calc non Af Amer: 61 mL/min/{1.73_m2} (ref >59)
Globulin, Total: 3.2 g/dL (ref 1.5–4.5)
Glucose: 106 mg/dL — ABNORMAL HIGH (ref 65–99)
Potassium: 4.2 mmol/L (ref 3.5–5.2)
Sodium: 133 mmol/L — ABNORMAL LOW (ref 134–144)
Total Protein: 7.9 g/dL (ref 6.0–8.5)

## 2019-09-19 LAB — HEMOGLOBIN A1C
Est. average glucose Bld gHb Est-mCnc: 140 mg/dL
Hgb A1c MFr Bld: 6.5 % — ABNORMAL HIGH (ref 4.8–5.6)

## 2019-09-19 LAB — CBC
Hematocrit: 32.9 % — ABNORMAL LOW (ref 34.0–46.6)
Hemoglobin: 11.5 g/dL (ref 11.1–15.9)
MCH: 30.2 pg (ref 26.6–33.0)
MCHC: 35 g/dL (ref 31.5–35.7)
MCV: 86 fL (ref 79–97)
Platelets: 334 10*3/uL (ref 150–450)
RBC: 3.81 x10E6/uL (ref 3.77–5.28)
RDW: 12.8 % (ref 11.7–15.4)
WBC: 10.4 10*3/uL (ref 3.4–10.8)

## 2019-09-19 LAB — HEPATITIS C ANTIBODY: Hep C Virus Ab: <0.1 s/co ratio (ref 0.0–0.9)

## 2019-09-20 ENCOUNTER — Telehealth: Payer: Self-pay

## 2019-09-20 NOTE — Telephone Encounter (Signed)
The patient was notified that her forms has been completed and faxed to [employer], her original has been placed up front for pickup and a copy has been kept for her chart.

## 2019-09-26 LAB — VITAMIN B12: Vitamin B-12: 844 pg/mL (ref 232–1245)

## 2019-09-26 LAB — SPECIMEN STATUS REPORT

## 2019-10-02 ENCOUNTER — Telehealth: Payer: Self-pay

## 2019-10-02 ENCOUNTER — Ambulatory Visit: Payer: 59

## 2019-10-02 NOTE — Telephone Encounter (Signed)
I left the pt a message that I was returning her call to reschedule the appt she had for this afternoon because she said she couldn't leave work.

## 2019-10-18 ENCOUNTER — Other Ambulatory Visit: Payer: Self-pay

## 2019-10-18 ENCOUNTER — Encounter: Payer: Self-pay | Admitting: Neurology

## 2019-10-18 ENCOUNTER — Ambulatory Visit (INDEPENDENT_AMBULATORY_CARE_PROVIDER_SITE_OTHER): Payer: 59 | Admitting: Neurology

## 2019-10-18 VITALS — BP 116/71 | HR 67 | Ht 62.0 in | Wt 195.5 lb

## 2019-10-18 DIAGNOSIS — I6521 Occlusion and stenosis of right carotid artery: Secondary | ICD-10-CM

## 2019-10-18 DIAGNOSIS — I639 Cerebral infarction, unspecified: Secondary | ICD-10-CM | POA: Diagnosis not present

## 2019-10-18 DIAGNOSIS — I609 Nontraumatic subarachnoid hemorrhage, unspecified: Secondary | ICD-10-CM

## 2019-10-18 NOTE — Progress Notes (Signed)
HISTORY OF PRESENT ILLNESS: [name] is a 59 year old female, seen in request by her primary care physician Dr. Baird Cancer, Bailey Mech for episode of visual loss, headaches, initial evaluation was on August 23, 2018.  I have reviewed and summarized the referring note from the referring physician.  She had past medical history of hyperlipidemia, hypertension, coronary artery disease, status post stent on July 12, 2018, currently taking aspirin and Plavix  She denies a previous history of migraine headaches, on August 18, 2018, while sitting at the computer, she noticed difficulty reading, then bilateral vision went out, she was able to lie down with eyes closed resting for 30 minutes, her vision came back normal, at the same time she noticed mild left temporal region throbbing headaches, when she went back to look at computer again, she experienced visual loss bilaterally again, was sent to the emergency room  I personally reviewed MRI of the brain no acute abnormality  MRA of neck: 50% stenosis of left internal carotid artery, critical stenosis of proximal right internal carotid artery, estimated to be greater than 80% stenosis, 50% stenosis of the left internal carotid artery  MRA of the brain showed no significant large vessel stenosis intracranially  She experienced a severe headache on August 08/2018, severe holoacranial pounding headache with associated light noise sensitivity, nauseous that was helped by Tylenol  Laboratory evaluations in 2020, LDL 148, cholesterol 217, negative C-reactive protein, ESR was mildly elevated 50, UDS was negative, normal TSH, A1c 5.9  UPDATE June 21 2019: Her headache has much improved, tolerating Topamax 100 mg every night  She remained on aspirin and Plavix, she did not have ultrasound of carotid artery to follow-up her severe right carotid artery stenosis, more than 80% based on MRA in 2020  She also complains of low back pain, radiating pain to  left hip, left groin area  UPDATE Oct 18 2019: She is accompanied by her friends at today's clinical visit, we went over her medical history in detail  Ultrasound of carotid artery on June 27 2019 showed 80 to 99% stenosis of right internal carotid artery, left side was 40 to 59% stenosis  She was referred to vascular surgeon  X-ray of left hip showed end-stage degenerative changes of left hip, pending left hip replacement surgery  On August 07, 2019, she had severe headache on the right side, also describe left leg weakness.  MRI of the brain showed 2 subcentimeter foci of acute infarction in the right insular, affecting the cortex, also subarachnoid hemorrhage within the sulci on the right  Four-vessel angiogram showed occluded right internal carotid artery at the bulb, 30% stenosis of left internal carotid artery  Repeat MRI of the brain on September 06, 2019 showed subtle hemosiderin within the right frontal sulci, no acute abnormality  Laboratory evaluations in September 2021 showed normal B12, CMP, creatinine of 1.0, sodium of 133, normal CBC hemoglobin of 11.5, normal vitamin D 33.7, negative hepatitis C, A1c of 6.5,  REVIEW OF SYSTEMS: Out of a complete 14 system review of symptoms, the patient complains only of the following symptoms, and all other reviewed systems are negative.  As above ALLERGIES: No Known Allergies  HOME MEDICATIONS: Outpatient Medications Prior to Visit  Medication Sig Dispense Refill  . acetaminophen (TYLENOL) 500 MG tablet Take 1,000 mg by mouth every 6 (six) hours as needed for headache (pain).    Marland Kitchen atorvastatin (LIPITOR) 80 MG tablet Take 1 tablet (80 mg total) by mouth daily at 6  PM. 90 tablet 2  . carvedilol (COREG) 6.25 MG tablet TAKE 1 TABLET (6.25 MG TOTAL) BY MOUTH 2 (TWO) TIMES DAILY WITH A MEAL. 180 tablet 2  . cholecalciferol (VITAMIN D3) 25 MCG (1000 UNIT) tablet Take 1,000 Units by mouth daily.     . fluticasone (FLONASE) 50 MCG/ACT nasal spray  Place 2 sprays into both nostrils daily as needed for allergies or rhinitis.    Marland Kitchen HYDROcodone-acetaminophen (NORCO/VICODIN) 5-325 MG tablet Take 1 tablet by mouth every 6 (six) hours as needed for moderate pain. 30 tablet 0  . hydrOXYzine (ATARAX/VISTARIL) 25 MG tablet Take 1 tablet (25 mg total) by mouth 3 (three) times daily as needed. 30 tablet 0  . levocetirizine (XYZAL) 5 MG tablet Take 1 tablet (5 mg total) by mouth daily. 90 tablet 1  . Magnesium 250 MG TABS TAKE 1 TABLET BY MOUTH DAILY WITH EVENING MEALS 30 tablet 2  . meloxicam (MOBIC) 15 MG tablet Take 1 tablet (15 mg total) by mouth daily as needed for pain. 30 tablet 6  . mometasone (ELOCON) 0.1 % cream Apply to affected area daily 45 g 1  . Olmesartan-amLODIPine-HCTZ 20-5-12.5 MG TABS Take 1 tablet by mouth daily. 90 tablet 1  . tiZANidine (ZANAFLEX) 4 MG tablet Take 1 tablet (4 mg total) by mouth every 8 (eight) hours as needed for muscle spasms. 30 tablet 0  . topiramate (TOPAMAX) 100 MG tablet Take 1 tablet (100 mg total) by mouth at bedtime. 90 tablet 4  . ezetimibe (ZETIA) 10 MG tablet Take 1 tablet (10 mg total) by mouth daily. 30 tablet 4  . Diclofenac Potassium,Migraine, (CAMBIA) 50 MG PACK Take 50 mg by mouth as needed. (Patient not taking: Reported on 09/18/2019) 12 each 11  . nitroGLYCERIN (NITROSTAT) 0.4 MG SL tablet Place 1 tablet (0.4 mg total) under the tongue every 5 (five) minutes as needed for chest pain. (Patient not taking: Reported on 09/18/2019) 30 tablet 1   No facility-administered medications prior to visit.    PAST MEDICAL HISTORY: Past Medical History:  Diagnosis Date  . Allergy   . Arthritis    back   . Atypical chest pain 06/30/2016  . Back pain    DUE TO INJURY AT WORK ON05/2013  . Bruises easily   . Chronic lower back pain   . Coronary artery disease   . Depression    was on meds 2 yrs ago   . Headache(784.0)    rarely  . History of bronchitis    last time about 6yr ago   . Hypertension     takes Benicar daily  . Hypothyroidism 06/30/2016  . Pure hypercholesterolemia 05/31/2019  . Vitamin D deficiency    takes Vitamin D 2 times/wk  . Weakness    and numbness right foot    PAST SURGICAL HISTORY: Past Surgical History:  Procedure Laterality Date  . BACK SURGERY    . CARPAL TUNNEL RELEASE Right   . CESAREAN SECTION  1988  . COLONOSCOPY    . EXPLORATORY LAPAROTOMY  1991   "took out fatty tumor" (11/09/2012)  . IR ANGIO INTRA EXTRACRAN SEL COM CAROTID INNOMINATE BILAT MOD SED  08/10/2019  . IR ANGIO VERTEBRAL SEL VERTEBRAL BILAT MOD SED  08/10/2019  . IR [place] VASC ACCESS RIGHT  08/10/2019  . LEFT HEART CATH AND CORONARY ANGIOGRAPHY N/A 08/06/2016   Procedure: Left Heart Cath and Coronary Angiography;  Surgeon: SBelva Crome MD;  [facility];  Service:  Cardiovascular;  Laterality: N/A;  . LEFT HEART CATH AND CORONARY ANGIOGRAPHY N/A 07/12/2018   Procedure: LEFT HEART CATH AND CORONARY ANGIOGRAPHY;  Surgeon: Leonie Man, MD;  Location: [facility];  Service: Cardiovascular;  Laterality: N/A;  . LUMBAR LAMINECTOMY/DECOMPRESSION MICRODISCECTOMY  11/09/2012   "L3-4" (11/09/2012)  . LUMBAR LAMINECTOMY/DECOMPRESSION MICRODISCECTOMY N/A 11/09/2012   Procedure: L3-L4 DECOMPRESSION AND MICRODISCECTOMY   (1 LEVEL);  Surgeon: Melina Schools, MD;  Location: [facility];  Service: Orthopedics;  Laterality: N/A;    FAMILY HISTORY: Family History  Problem Relation Age of Onset  . Heart disease Mother   . CAD Mother   . Stroke Mother   . Heart disease Sister   . Heart attack Sister   . Liver disease Brother   . Other Father        unknown medical history  . CAD Brother   . Colon cancer Neg Hx   . Esophageal cancer Neg Hx   . Rectal cancer Neg Hx   . Stomach cancer Neg Hx     SOCIAL HISTORY: Social History   Socioeconomic History  . Marital status: Divorced    Spouse name: Not on file  . Number of children: 1  . Years of education: Not on file  .  Highest education level: Some college, no degree  Occupational History  . Occupation: Sports administrator  Tobacco Use  . Smoking status: Never Smoker  . Smokeless tobacco: Never Used  Vaping Use  . Vaping Use: Never used  Substance and Sexual Activity  . Alcohol use: Yes    Alcohol/week: 7.0 standard drinks    Types: 7 Glasses of wine per week    Comment: social  . Drug use: No  . Sexual activity: Yes    Birth control/protection: Post-menopausal  Other Topics Concern  . Not on file  Social History Narrative   Right-handed.   Occasional caffeine.   Lives alone.   Social Determinants of Health   Financial Resource Strain:   . Difficulty of Paying Living Expenses: Not on file  Food Insecurity:   . Worried About Charity fundraiser in the Last Year: Not on file  . Ran Out of Food in the Last Year: Not on file  Transportation Needs:   . Lack of Transportation (Medical): Not on file  . Lack of Transportation (Non-Medical): Not on file  Physical Activity:   . Days of Exercise per Week: Not on file  . Minutes of Exercise per Session: Not on file  Stress:   . Feeling of Stress : Not on file  Social Connections:   . Frequency of Communication with Friends and Family: Not on file  . Frequency of Social Gatherings with Friends and Family: Not on file  . Attends Religious Services: Not on file  . Active Member of Clubs or Organizations: Not on file  . Attends Archivist Meetings: Not on file  . Marital Status: Not on file  Intimate Partner Violence:   . Fear of Current or Ex-Partner: Not on file  . Emotionally Abused: Not on file  . Physically Abused: Not on file  . Sexually Abused: Not on file    PHYSICAL EXAM  Vitals:   10/18/19 1022  BP: 116/71  Pulse: 67  Weight: 195 lb 8 oz (88.7 kg)  Height: 5' 2"  (1.575 m)   Body mass index is 35.76 kg/m.   PHYSICAL EXAMNIATION:  Gen: NAD, conversant, well nourised, well groomed  Cardiovascular: Regular rate rhythm, no peripheral edema, warm, nontender. Eyes: Conjunctivae clear without exudates or hemorrhage Neck: Supple, no carotid bruits. Pulmonary: Clear to auscultation bilaterally   NEUROLOGICAL EXAM:  MENTAL STATUS: Speech/Cognition: Awake, alert, normal speech, oriented to history taking and casual conversation.  CRANIAL NERVES: CN II: Visual fields are full to confrontation.  Pupils are round equal and briskly reactive to light. CN III, IV, VI: extraocular movement are normal. No ptosis. CN V: Facial sensation is intact to light touch. CN VII: Face is symmetric with normal eye closure and smile. CN VIII: Hearing is normal to casual conversation CN IX, X: Palate elevates symmetrically. Phonation is normal. CN XI: Head turning and shoulder shrug are intact CN XII: Tongue is midline with normal movements and no atrophy.  MOTOR: Muscle bulk and tone are normal. Muscle strength is normal.  REFLEXES: Reflexes are 2  and symmetric at the biceps, triceps, knees and ankles. Plantar responses are flexor.  SENSORY: Intact to light touch, pinprick, positional and vibratory sensation at fingers and toes.  COORDINATION: There is no trunk or limb ataxia.    GAIT/STANCE: Need push-up to get up from seated position, dragging her left leg,  DIAGNOSTIC DATA (LABS, IMAGING, TESTING) - I reviewed patient records, labs, notes, testing and imaging myself where available.  Lab Results  Component Value Date   WBC 10.4 09/18/2019   HGB 11.5 09/18/2019   HCT 32.9 (L) 09/18/2019   MCV 86 09/18/2019   PLT 334 09/18/2019      Component Value Date/Time   NA 133 (L) 09/18/2019 1622   K 4.2 09/18/2019 1622   CL 97 09/18/2019 1622   CO2 23 09/18/2019 1622   GLUCOSE 106 (H) 09/18/2019 1622   GLUCOSE 117 (H) 08/10/2019 0506   BUN 21 09/18/2019 1622   CREATININE 1.01 (H) 09/18/2019 1622   CALCIUM 9.9 09/18/2019 1622   PROT 7.9 09/18/2019 1622   ALBUMIN 4.7  09/18/2019 1622   AST 19 09/18/2019 1622   ALT 47 (H) 09/18/2019 1622   ALKPHOS 89 09/18/2019 1622   BILITOT 0.4 09/18/2019 1622   GFRNONAA 61 09/18/2019 1622   GFRAA 70 09/18/2019 1622   Lab Results  Component Value Date   CHOL 115 08/09/2019   HDL 37 (L) 08/09/2019   LDLCALC 68 08/09/2019   TRIG 50 08/09/2019   CHOLHDL 3.1 08/09/2019   Lab Results  Component Value Date   HGBA1C 6.5 (H) 09/18/2019   Lab Results  Component Value Date   OJJKKXFG18 299 09/18/2019   Lab Results  Component Value Date   TSH 1.871 08/07/2019      ASSESSMENT AND PLAN 59 y.o. year old female  Stroke Bilateral carotid artery stenosis Total occlusion of right internal carotid artery History of right MCA stroke also subarachnoid hemorrhage in the setting of aspirin and Plavix  She is on aspirin 81 mg along I also emphasized importance of increased water intake  Headache with migraine features Much improved, Keep Topamax 100 mg at bedtime [place] as needed  Left hip end-stage degenerative disease, pending left hip replacement  Marcial Pacas, M.D. Ph.D.  [facility] [place], [place] [address] Phone: [phone] Fax:      [phone]

## 2019-11-02 ENCOUNTER — Other Ambulatory Visit: Payer: Self-pay | Admitting: Cardiovascular Disease

## 2019-11-16 ENCOUNTER — Other Ambulatory Visit: Payer: Self-pay | Admitting: Internal Medicine

## 2019-11-20 ENCOUNTER — Encounter: Payer: Self-pay | Admitting: Orthopaedic Surgery

## 2019-11-20 ENCOUNTER — Other Ambulatory Visit: Payer: Self-pay

## 2019-11-20 ENCOUNTER — Ambulatory Visit (INDEPENDENT_AMBULATORY_CARE_PROVIDER_SITE_OTHER): Payer: 59 | Admitting: Orthopaedic Surgery

## 2019-11-20 ENCOUNTER — Ambulatory Visit (INDEPENDENT_AMBULATORY_CARE_PROVIDER_SITE_OTHER): Payer: 59

## 2019-11-20 ENCOUNTER — Ambulatory Visit: Payer: Self-pay

## 2019-11-20 DIAGNOSIS — M1612 Unilateral primary osteoarthritis, left hip: Secondary | ICD-10-CM | POA: Diagnosis not present

## 2019-11-20 DIAGNOSIS — M1711 Unilateral primary osteoarthritis, right knee: Secondary | ICD-10-CM | POA: Diagnosis not present

## 2019-11-20 DIAGNOSIS — M25552 Pain in left hip: Secondary | ICD-10-CM | POA: Diagnosis not present

## 2019-11-20 MED ORDER — BUPIVACAINE HCL 0.5 % IJ SOLN
2.0000 mL | INTRAMUSCULAR | Status: AC | PRN
  Administered 2019-11-20: 2 mL via INTRA_ARTICULAR

## 2019-11-20 MED ORDER — LIDOCAINE HCL 1 % IJ SOLN
2.0000 mL | INTRAMUSCULAR | Status: AC | PRN
  Administered 2019-11-20: 2 mL

## 2019-11-20 MED ORDER — METHYLPREDNISOLONE ACETATE 40 MG/ML IJ SUSP
40.0000 mg | INTRAMUSCULAR | Status: AC | PRN
Start: 2019-11-20 — End: 2019-11-20
  Administered 2019-11-20: 40 mg via INTRA_ARTICULAR

## 2019-11-20 NOTE — Progress Notes (Signed)
Office Visit Note   Patient: [name]           Date of Birth: 1960/08/11           MRN: [id number] Visit Date: 11/20/2019              Requested by: Dorothyann Peng, MD [address] [place],  [place] [address] PCP: Dorothyann Peng, MD   Assessment & Plan: Visit Diagnoses:  1. Primary osteoarthritis of right knee   2. Primary osteoarthritis of left hip     Plan: Impression is end-stage right knee DJD and end-stage left hip DJD.  We repeated the right knee injection today which the patient tolerated well.  Dr. Prince Rome also repeated the left hip injection today.  Patient will contact her stroke neurologist to be cleared to have a left hip replacement in the future.  Follow-Up Instructions: Return if symptoms worsen or fail to improve.   Orders:  Orders Placed This Encounter  Procedures  . XR KNEE 3 VIEW RIGHT  . US Guided Needle Placement - No Linked Charges   No orders of the defined types were placed in this encounter.     Procedures: Large Joint Inj: R knee on 11/20/2019 4:49 PM Indications: pain Details: 22 G needle  Arthrogram: No  Medications: 40 mg methylPREDNISolone acetate 40 MG/ML; 2 mL lidocaine 1 %; 2 mL bupivacaine 0.5 % Consent was given by the patient. Patient was prepped and draped in the usual sterile fashion.       Clinical Data: No additional findings.   Subjective: Chief Complaint  Patient presents with  . Right Knee - Pain  . Left Hip - Pain    Tanza returns today for follow-up of right knee DJD and left hip DJD.  Prior injections were both very effective and she would like to repeat these.  She is no longer on Plavix and only on aspirin for history of stroke.  She still has the 100% blockage in the right carotid artery.  Denies any changes in medical history.  She continues to have severe pain in the right knee and the left hip with start up stiffness and pain and giving way.   Review of Systems  Constitutional:  Negative.   HENT: Negative.   Eyes: Negative.   Respiratory: Negative.   Cardiovascular: Negative.   Endocrine: Negative.   Musculoskeletal: Negative.   Neurological: Negative.   Hematological: Negative.   Psychiatric/Behavioral: Negative.   All other systems reviewed and are negative.    Objective: Vital Signs: LMP 06/03/2011   Physical Exam Vitals and nursing note reviewed.  Constitutional:      Appearance: She is well-developed.  Pulmonary:     Effort: Pulmonary effort is normal.  Skin:    General: Skin is warm.     Capillary Refill: Capillary refill takes less than 2 seconds.  Neurological:     Mental Status: She is alert and oriented to person, place, and time.  Psychiatric:        Behavior: Behavior normal.        Thought Content: Thought content normal.        Judgment: Judgment normal.     Ortho Exam Left hip and right knee exams are unchanged. Specialty Comments:  No specialty comments available.  Imaging: XR KNEE 3 VIEW RIGHT  Result Date: 11/20/2019 Bone-on-bone medial compartment joint space narrowing.  OCD lesion of the medial femoral condyle.    PMFS History: Patient Active Problem List  Diagnosis Date Noted  . Primary osteoarthritis of right knee 11/20/2019  . Cerebrovascular accident (CVA) (HCC) 10/18/2019  . ICAO (internal carotid artery occlusion), right 10/18/2019  . Primary osteoarthritis of left hip 09/11/2019  . SAH (subarachnoid hemorrhage) (HCC) 08/07/2019  . Acute embolic stroke (HCC)   . Left hip pain 06/25/2019  . Severe left groin pain 06/21/2019  . Bilateral carotid artery stenosis 06/21/2019  . Pure hypercholesterolemia 05/31/2019  . CAD in native artery 04/09/2019  . Complicated migraine 08/23/2018  . Hyperglycemia 07/10/2018  . Abnormal stress test 08/06/2016  . Essential hypertension 06/30/2016  . Hypothyroidism 06/30/2016   Past Medical History:  Diagnosis Date  . Allergy   . Arthritis    back   . Atypical  chest pain 06/30/2016  . Back pain    DUE TO INJURY AT WORK ON05/2013  . Bruises easily   . Chronic lower back pain   . Coronary artery disease   . Depression    was on meds 2 yrs ago   . Headache(784.0)    rarely  . History of bronchitis    last time about 22yrs ago   . Hypertension    takes Benicar daily  . Hypothyroidism 06/30/2016  . Pure hypercholesterolemia 05/31/2019  . Vitamin D deficiency    takes Vitamin D 2 times/wk  . Weakness    and numbness right foot    Family History  Problem Relation Age of Onset  . Heart disease Mother   . CAD Mother   . Stroke Mother   . Heart disease Sister   . Heart attack Sister   . Liver disease Brother   . Other Father        unknown medical history  . CAD Brother   . Colon cancer Neg Hx   . Esophageal cancer Neg Hx   . Rectal cancer Neg Hx   . Stomach cancer Neg Hx     Past Surgical History:  Procedure Laterality Date  . BACK SURGERY    . CARPAL TUNNEL RELEASE Right   . CESAREAN SECTION  1988  . COLONOSCOPY    . EXPLORATORY LAPAROTOMY  1991   "took out fatty tumor" (11/09/2012)  . IR ANGIO INTRA EXTRACRAN SEL COM CAROTID INNOMINATE BILAT MOD SED  08/10/2019  . IR ANGIO VERTEBRAL SEL VERTEBRAL BILAT MOD SED  08/10/2019  . IR US GUIDE VASC ACCESS RIGHT  08/10/2019  . LEFT HEART CATH AND CORONARY ANGIOGRAPHY N/A 08/06/2016   Procedure: Left Heart Cath and Coronary Angiography;  Surgeon: Lyn Records, MD;  Location: [facility];  Service: Cardiovascular;  Laterality: N/A;  . LEFT HEART CATH AND CORONARY ANGIOGRAPHY N/A 07/12/2018   Procedure: LEFT HEART CATH AND CORONARY ANGIOGRAPHY;  Surgeon: Marykay Lex, MD;  Location: [facility];  Service: Cardiovascular;  Laterality: N/A;  . LUMBAR LAMINECTOMY/DECOMPRESSION MICRODISCECTOMY  11/09/2012   "L3-4" (11/09/2012)  . LUMBAR LAMINECTOMY/DECOMPRESSION MICRODISCECTOMY N/A 11/09/2012   Procedure: L3-L4 DECOMPRESSION AND MICRODISCECTOMY   (1 LEVEL);  Surgeon: Venita Lick, MD;  Location: [facility];  Service: Orthopedics;  Laterality: N/A;   Social History   Occupational History  . Occupation: Control and instrumentation engineer  Tobacco Use  . Smoking status: Never Smoker  . Smokeless tobacco: Never Used  Vaping Use  . Vaping Use: Never used  Substance and Sexual Activity  . Alcohol use: Yes    Alcohol/week: 7.0 standard drinks    Types: 7 Glasses of wine per week  Comment: social  . Drug use: No  . Sexual activity: Yes    Birth control/protection: Post-menopausal

## 2019-11-20 NOTE — Progress Notes (Signed)
Subjective: Patient is here for ultrasound-guided intra-articular left hip injection.   Prior injection helped quite a bit.  Objective:  Pain with IR.  Procedure: Ultrasound guided injection is preferred based studies that show increased duration, increased effect, greater accuracy, decreased procedural pain, increased response rate, and decreased cost with ultrasound guided versus blind injection.   Verbal informed consent obtained.  Time-out conducted.  Noted no overlying erythema, induration, or other signs of local infection. Ultrasound-guided left hip injection: After sterile prep with Betadine, injected 8 cc 1% lidocaine without epinephrine and 40 mg methylprednisolone using a 22-gauge spinal needle, passing the needle through the iliofemoral ligament into the femoral head/neck junction.  Injectate seen filling joint capsule.  Good immediate relief.

## 2019-11-28 ENCOUNTER — Telehealth: Payer: Self-pay | Admitting: Neurology

## 2019-11-28 NOTE — Telephone Encounter (Signed)
Pt. wants to know if she should consult with Dr. or PCP for covid booster. She also asks about getting clearance for a hip replacement.

## 2019-12-03 ENCOUNTER — Telehealth: Payer: Self-pay

## 2019-12-03 NOTE — Telephone Encounter (Signed)
The pt left a message that she wants to know if she can get booster for the covid because she had a stroke.

## 2019-12-03 NOTE — Telephone Encounter (Signed)
Yes, she can get the booster--no sooner than six months after 2nd vaccine

## 2019-12-04 ENCOUNTER — Telehealth: Payer: Self-pay

## 2019-12-04 NOTE — Telephone Encounter (Signed)
The pt was notified that Dr. Allyne Gee said she can get the covid booster and to wait 6 mts after her last injection of covid vaccination.

## 2019-12-05 NOTE — Telephone Encounter (Signed)
Called and spoke to patient, PCP advised her she was okay to take the covid booster and discussed with patient that the surgeon is generally the one to reach out to Dr. Terrace Arabia for surgical clearance.  She said she hadn't gotten it scheduled yet and just had an evaluation but if she was going to get one, she wanted to know what the procedure was.  Patient expressed appreciation.

## 2019-12-25 ENCOUNTER — Ambulatory Visit: Payer: 59 | Admitting: Neurology

## 2020-01-14 ENCOUNTER — Other Ambulatory Visit: Payer: Self-pay | Admitting: Internal Medicine

## 2020-01-20 LAB — RESULTS CONSOLE HPV: CHL HPV: NEGATIVE

## 2020-02-02 ENCOUNTER — Other Ambulatory Visit: Payer: Self-pay | Admitting: Nurse Practitioner

## 2020-02-02 DIAGNOSIS — I10 Essential (primary) hypertension: Secondary | ICD-10-CM

## 2020-02-07 ENCOUNTER — Other Ambulatory Visit: Payer: Self-pay | Admitting: Cardiovascular Disease

## 2020-02-27 ENCOUNTER — Telehealth (HOSPITAL_COMMUNITY): Payer: Self-pay

## 2020-02-27 ENCOUNTER — Other Ambulatory Visit (HOSPITAL_COMMUNITY): Payer: Self-pay | Admitting: Interventional Radiology

## 2020-02-27 ENCOUNTER — Other Ambulatory Visit (HOSPITAL_COMMUNITY): Payer: Self-pay

## 2020-02-27 DIAGNOSIS — I6523 Occlusion and stenosis of bilateral carotid arteries: Secondary | ICD-10-CM

## 2020-02-27 DIAGNOSIS — I639 Cerebral infarction, unspecified: Secondary | ICD-10-CM

## 2020-02-27 NOTE — Telephone Encounter (Signed)
Called to schedule cta head/neck, no answer, left vm. AW 

## 2020-03-06 ENCOUNTER — Other Ambulatory Visit: Payer: Self-pay

## 2020-03-06 ENCOUNTER — Ambulatory Visit (HOSPITAL_COMMUNITY)
Admission: RE | Admit: 2020-03-06 | Discharge: 2020-03-06 | Disposition: A | Discharge status: Home / Self Care | Payer: 59 | Source: Ambulatory Visit | Attending: Interventional Radiology | Admitting: Interventional Radiology

## 2020-03-06 DIAGNOSIS — I639 Cerebral infarction, unspecified: Secondary | ICD-10-CM | POA: Diagnosis not present

## 2020-03-06 DIAGNOSIS — I6523 Occlusion and stenosis of bilateral carotid arteries: Secondary | ICD-10-CM

## 2020-03-06 LAB — POCT I-STAT CREATININE: Creatinine, Ser: 0.8 mg/dL (ref 0.44–1.00)

## 2020-03-06 IMAGING — CT CT ANGIO HEAD
2 of 11 series · 6 of 33 positions shown · IV contrast (APPLIED)
Comparison: Brain MRI [DATE].

CLINICAL DATA: Cerebrovascular accident (CVA), unspecified
mechanism. Bilateral carotid artery stenosis. Additional history
provided by scanning technologist: Preoperative scan of carotid
arteries prior to undergoing knee replacement.

EXAM:
CT ANGIOGRAPHY HEAD AND NECK
TECHNIQUE: Multidetector CT imaging of the head and neck was performed using
the standard protocol during bolus administration of intravenous
contrast. Multiplanar CT image reconstructions and MIPs were
obtained to evaluate the vascular anatomy. Carotid stenosis
measurements (when applicable) are obtained utilizing NASCET
criteria, using the distal internal carotid diameter as the
denominator.
CONTRAST:  75mL OMNIPAQUE IOHEXOL 350 MG/ML SOLN

[Series 11: ax thin · axial · 0.39mm/px · z∈[-252,-94]mm · 3 of 317 slices shown]
[im 80/317  soft-tissue]
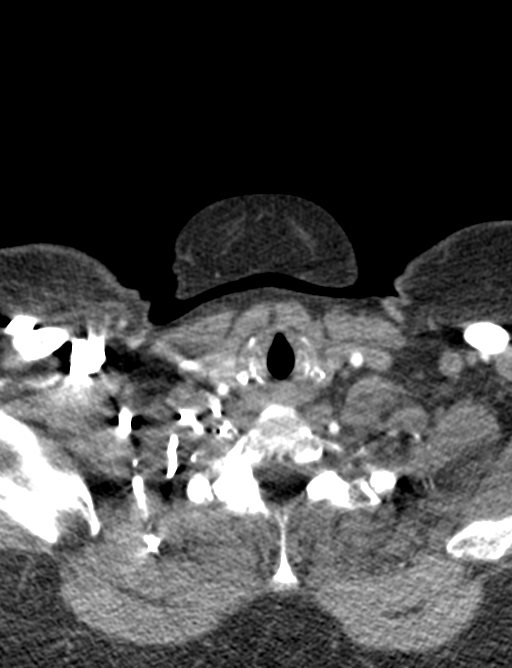
[im 159/317  bone]
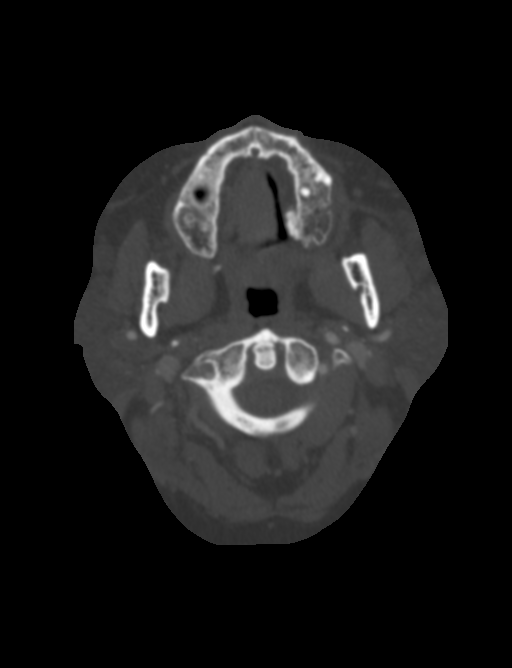
[im 238/317  soft-tissue]
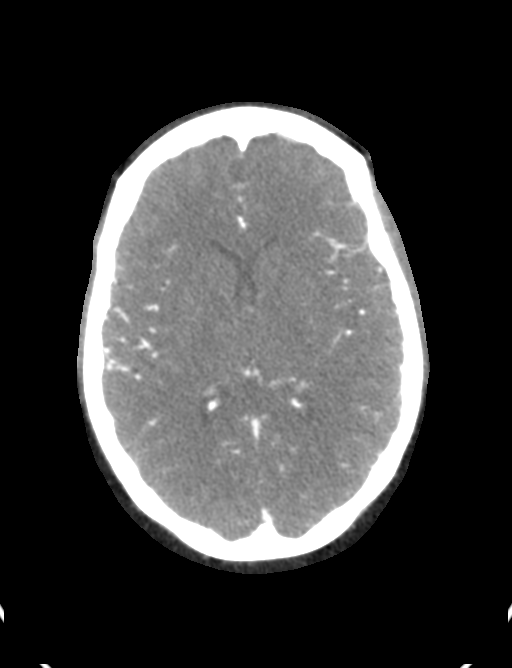

[Series 13: sagittal thin · axial · 0.39mm/px · z∈[-252,-94]mm · 3 of 317 slices shown]
[im 80/317  soft-tissue]
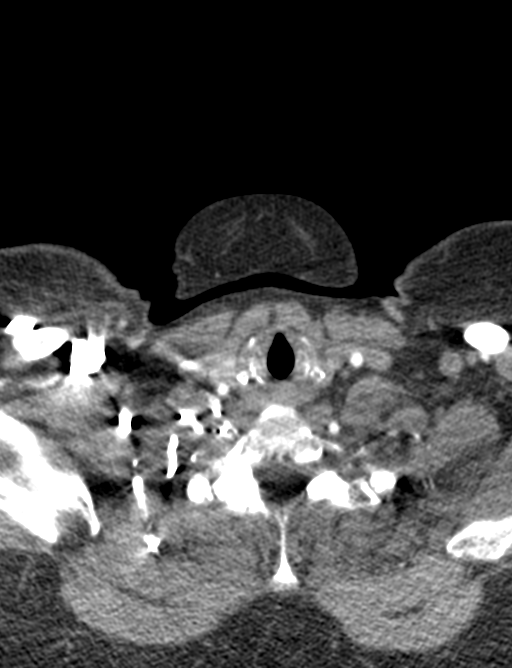
[im 159/317  soft-tissue]
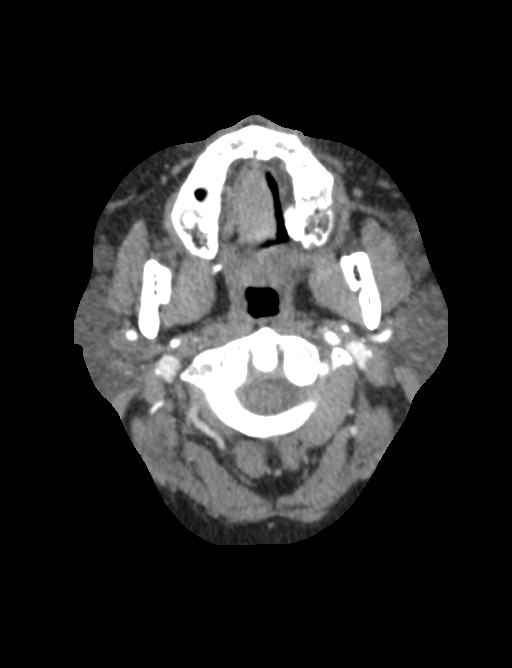
[im 238/317  soft-tissue]
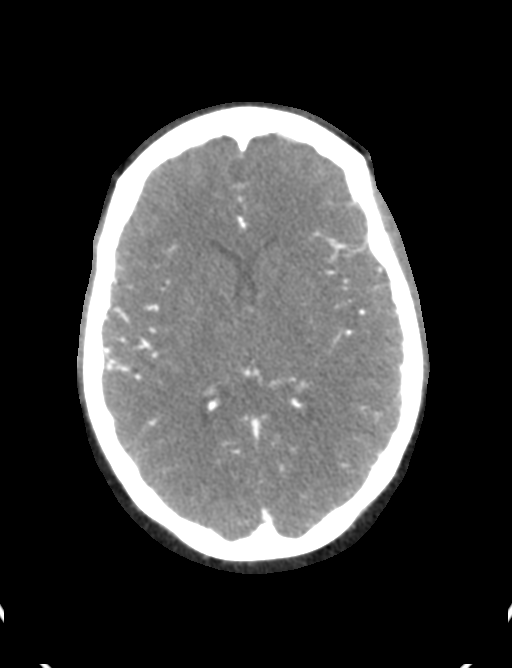

[6 of 33 positions shown; findings below may reference images not displayed]

Brain MRI [DATE]. CT
angiogram head/neck [DATE]. Report from catheter based
angiography [DATE].
FINDINGS: CT HEAD FINDINGS

Brain:

Cerebral volume is normal.

Known small chronic infarcts within the right insula/subinsular
region were better appreciated on the prior brain MRI of [DATE]
(acute at that time).

There is no acute intracranial hemorrhage.

No demarcated cortical infarct.

No extra-axial fluid collection.

No evidence of intracranial mass.

No midline shift.

Vascular: Atherosclerotic calcifications.

Skull: Normal. Negative for fracture or focal lesion.

Sinuses: No significant paranasal sinus disease.

Orbits: No mass or acute finding.

Review of the MIP images confirms the above findings

CTA NECK FINDINGS

Aortic arch: Standard aortic branching. Atherosclerotic plaque
within the visualized aortic arch and proximal major branch vessels
of the neck. No hemodynamically significant innominate or proximal
subclavian artery stenosis.

Right carotid system: Streak artifact from a dense right-sided
contrast bolus partially obscures the proximal common carotid
artery. Within this limitation, the common carotid artery is patent
to the bifurcation without significant stenosis. Prominent soft and
calcified plaque within the proximal ICA. As before, the ICA is
occluded throughout the neck.

Left carotid system: Mild nonstenotic calcified plaque at the origin
of the common carotid artery. The common carotid artery is patent to
the bifurcation without stenosis. Soft and calcified plaque within
the carotid bifurcation and proximal ICA. Unchanged from the prior
exam, there is apparent 60-70% stenosis of the proximal ICA.
However, this stenosis could be accentuated on the current exam due
to blooming artifact from calcified plaque.

Vertebral arteries: Peak artifact from a dense right-sided contrast
bolus limits evaluation of the V1 right vertebral artery. Within
this limitation, the vertebral arteries are patent within the neck
bilaterally without appreciable stenosis.

Skeleton: No acute bony abnormality or aggressive osseous lesion.

Other neck: No neck mass or cervical lymphadenopathy.

Upper chest: No consolidation within the imaged lung apices.

Review of the MIP images confirms the above findings

CTA HEAD FINDINGS

Anterior circulation:

As before, there is gradual reconstitution of enhancement within the
intracranial right ICA at the level of the cavernous segment.
Calcified plaque within the intracranial left ICA without
appreciable stenosis. The M1 middle cerebral arteries are patent. No
M2 proximal branch occlusion or high-grade proximal stenosis is
identified. The anterior cerebral arteries are patent. Hypoplastic
right A1 segment. No intracranial aneurysm is identified.

Posterior circulation:

The intracranial vertebral arteries are patent. The basilar artery
is patent. The posterior cerebral arteries are patent. Moderate
stenosis within the P3 right PCA, new from the prior exam (series
14, image 20) (series 16, image 18). Posterior communicating
arteries are present bilaterally.

Venous sinuses: Within the limitations of contrast timing, no
convincing thrombus.

Anatomic variants: None significant

Review of the MIP images confirms the above findings
IMPRESSION: CT head:

1. No evidence of acute intracranial abnormality.
2. Known small chronic infarcts within the right insula/subinsular
region better appreciated on the prior brain MRI of [DATE].

CTA neck:

1. Streak artifact from a dense right-sided contrast bolus limits
evaluation of the proximal right common carotid artery and V1 right
vertebral artery.
2. Redemonstrated occlusion of the right internal carotid artery
within the neck.
3. Unchanged apparent 60-70% stenosis of the proximal left ICA.
However, this stenosis could potentially be exaggerated by blooming
artifact from calcified plaque. Carotid artery duplex may be helpful
for further evaluation.
4. No appreciable vertebral artery stenosis within the neck.

CTA head:

1. As before, there is gradual reconstitution of enhancement within
the intracranial right ICA at the level of the cavernous segment.
2. Elsewhere, no intracranial large vessel occlusion or proximal
high-grade arterial stenosis is demonstrated.
3. Apparent moderate stenosis within the P3 right PCA, new from the
prior CTA of [DATE].

## 2020-03-06 MED ORDER — IOHEXOL 350 MG/ML SOLN
75.0000 mL | Freq: Once | INTRAVENOUS | Status: AC | PRN
  Administered 2020-03-06: 75 mL via INTRAVENOUS

## 2020-03-11 ENCOUNTER — Encounter: Payer: Self-pay | Admitting: Internal Medicine

## 2020-03-11 ENCOUNTER — Ambulatory Visit (INDEPENDENT_AMBULATORY_CARE_PROVIDER_SITE_OTHER): Payer: 59 | Admitting: Internal Medicine

## 2020-03-11 ENCOUNTER — Other Ambulatory Visit: Payer: Self-pay

## 2020-03-11 VITALS — BP 132/84 | HR 68 | Temp 98.5°F | Ht 62.0 in | Wt 198.4 lb

## 2020-03-11 DIAGNOSIS — I2510 Atherosclerotic heart disease of native coronary artery without angina pectoris: Secondary | ICD-10-CM

## 2020-03-11 DIAGNOSIS — R7309 Other abnormal glucose: Secondary | ICD-10-CM

## 2020-03-11 DIAGNOSIS — I119 Hypertensive heart disease without heart failure: Secondary | ICD-10-CM | POA: Diagnosis not present

## 2020-03-11 DIAGNOSIS — Z6836 Body mass index (BMI) 36.0-36.9, adult: Secondary | ICD-10-CM

## 2020-03-11 DIAGNOSIS — Z1211 Encounter for screening for malignant neoplasm of colon: Secondary | ICD-10-CM

## 2020-03-11 MED ORDER — FLUTICASONE PROPIONATE 50 MCG/ACT NA SUSP: 2.0000 | Freq: Every day | NASAL | 1 refills | Status: DC | PRN

## 2020-03-11 MED ORDER — MAGNESIUM 250 MG PO TABS: ORAL_TABLET | ORAL | 2 refills | Status: DC

## 2020-03-11 NOTE — Progress Notes (Signed)
I,Katawbba Wiggins,acting as a Education administrator for Maximino Greenland, MD.,have documented all relevant documentation on the behalf of Maximino Greenland, MD,as directed by  Maximino Greenland, MD while in the presence of Maximino Greenland, MD.  This visit occurred during the SARS-CoV-2 public health emergency.  Safety protocols were in place, including screening questions prior to the visit, additional usage of staff PPE, and extensive cleaning of exam room while observing appropriate contact time as indicated for disinfecting solutions.  Subjective:     Patient ID: [name] , female    DOB: April 01, 1960 , 60 y.o.   MRN: [id number]   Chief Complaint  Patient presents with  . Hypertension    HPI  The patient is here today for a blood pressure f/u.  She reports compliance with meds. Denies headaches, chest pain and shortness of breath. She admits she is not exercising as she should. .  Hypertension This is a chronic problem. The current episode started more than 1 year ago. The problem has been gradually improving since onset. The problem is controlled. Pertinent negatives include no blurred vision, palpitations or shortness of breath. The current treatment provides moderate improvement. Compliance problems include exercise.      Past Medical History:  Diagnosis Date  . Allergy   . Arthritis    back   . Atypical chest pain 06/30/2016  . Back pain    DUE TO INJURY AT WORK ON05/2013  . Bruises easily   . Chronic lower back pain   . Coronary artery disease   . Depression    was on meds 2 yrs ago   . Headache(784.0)    rarely  . History of bronchitis    last time about 33yr ago   . Hypertension    takes Benicar daily  . Hypothyroidism 06/30/2016  . Pure hypercholesterolemia 05/31/2019  . Vitamin D deficiency    takes Vitamin D 2 times/wk  . Weakness    and numbness right foot     Family History  Problem Relation Age of Onset  . Heart disease Mother   . CAD Mother   . Stroke Mother   .  Heart disease Sister   . Heart attack Sister   . Liver disease Brother   . Other Father        unknown medical history  . CAD Brother   . Colon cancer Neg Hx   . Esophageal cancer Neg Hx   . Rectal cancer Neg Hx   . Stomach cancer Neg Hx      Current Outpatient Medications:  .  acetaminophen (TYLENOL) 500 MG tablet, Take 1,000 mg by mouth every 6 (six) hours as needed for headache (pain)., Disp: , Rfl:  .  atorvastatin (LIPITOR) 80 MG tablet, Take 1 tablet (80 mg total) by mouth daily at 6 PM., Disp: 90 tablet, Rfl: 2 .  carvedilol (COREG) 6.25 MG tablet, TAKE 1 TABLET (6.25 MG TOTAL) BY MOUTH 2 (TWO) TIMES DAILY WITH A MEAL. (Patient taking differently: Take 6.25 mg by mouth 2 (two) times daily with a meal. 1 per day), Disp: 180 tablet, Rfl: 2 .  cholecalciferol (VITAMIN D3) 25 MCG (1000 UNIT) tablet, Take 1,000 Units by mouth daily. , Disp: , Rfl:  .  ezetimibe (ZETIA) 10 MG tablet, TAKE 1 TABLET BY MOUTH EVERY DAY, Disp: 90 tablet, Rfl: 3 .  levocetirizine (XYZAL) 5 MG tablet, Take 1 tablet (5 mg total) by mouth daily., Disp: 90 tablet, Rfl: 1 .  meloxicam (MOBIC) 15 MG tablet, Take 1 tablet (15 mg total) by mouth daily as needed for pain., Disp: 30 tablet, Rfl: 6 .  Olmesartan-amLODIPine-HCTZ 20-5-12.5 MG TABS, TAKE 1 TABLET BY MOUTH EVERY DAY, Disp: 90 tablet, Rfl: 1 .  tiZANidine (ZANAFLEX) 4 MG tablet, Take 1 tablet (4 mg total) by mouth every 8 (eight) hours as needed for muscle spasms., Disp: 30 tablet, Rfl: 0 .  fluticasone (FLONASE) 50 MCG/ACT nasal spray, Place 2 sprays into both nostrils daily as needed for allergies or rhinitis., Disp: 16 g, Rfl: 1 .  HYDROcodone-acetaminophen (NORCO/VICODIN) 5-325 MG tablet, Take 1 tablet by mouth every 6 (six) hours as needed for moderate pain. (Patient not taking: Reported on 03/11/2020), Disp: 30 tablet, Rfl: 0 .  hydrOXYzine (ATARAX/VISTARIL) 25 MG tablet, Take 1 tablet (25 mg total) by mouth 3 (three) times daily as needed. (Patient not  taking: Reported on 03/11/2020), Disp: 30 tablet, Rfl: 0 .  Magnesium 250 MG TABS, TAKE 1 TABLET BY MOUTH DAILY WITH EVENING MEALS, Disp: 90 tablet, Rfl: 2 .  mometasone (ELOCON) 0.1 % cream, Apply to affected area daily (Patient not taking: Reported on 03/11/2020), Disp: 45 g, Rfl: 1 .  topiramate (TOPAMAX) 100 MG tablet, Take 1 tablet (100 mg total) by mouth at bedtime., Disp: 90 tablet, Rfl: 4   No Known Allergies   Review of Systems  Constitutional: Negative.   Eyes: Negative for blurred vision.  Respiratory: Negative.  Negative for shortness of breath.   Cardiovascular: Negative.  Negative for palpitations.  Gastrointestinal: Negative.   Musculoskeletal:       Left hip and leg pain Right leg and knee pain  Psychiatric/Behavioral: Negative.   All other systems reviewed and are negative.    Today's Vitals   03/11/20 1620  BP: 132/84  Pulse: 68  Temp: 98.5 F (36.9 C)  TempSrc: Oral  Weight: 198 lb 6.4 oz (90 kg)  Height: _0  (1.575 m)   Body mass index is 36.29 kg/m.  Wt Readings from Last 3 Encounters:  03/11/20 198 lb 6.4 oz (90 kg)  10/18/19 195 lb 8 oz (88.7 kg)  09/18/19 192 lb (87.1 kg)   Objective:  Physical Exam Vitals and nursing note reviewed.  Constitutional:      Appearance: Normal appearance. She is obese.  HENT:     Head: Normocephalic and atraumatic.     Nose:     Comments: Masked     Mouth/Throat:     Comments: Masked  Cardiovascular:     Rate and Rhythm: Normal rate and regular rhythm.     Heart sounds: Normal heart sounds.  Pulmonary:     Effort: Pulmonary effort is normal.     Breath sounds: Normal breath sounds.  Skin:    General: Skin is warm.  Neurological:     General: No focal deficit present.     Mental Status: She is alert.  Psychiatric:        Mood and Affect: Mood normal.        Behavior: Behavior normal.         Assessment And Plan:     1. Hypertensive heart disease without heart failure Comments: Chronic, fair  control. She is aware that goal BP is less than 130/80. Advised to follow low sodium diet.  - CMP14+EGFR  2. CAD in native artery Comments: Chronic, encouraged to follow healthy lifestyle. Importance of statin compliance was d/w patient.  She has not had recent Cardiology evaluation. I will  refer her back to Dr. Oval Linsey for re-evaluation.   3. Other abnormal glucose Comments: Her a1c was 6.5 at her last visit. I will recheck this today. She declines starting meds today, wants to review meds first. Denies fam/personal h/o thyroid CA.  - Hemoglobin A1c  4. Class 2 severe obesity due to excess calories with serious comorbidity and body mass index (BMI) of 36.0 to 36.9 in adult [facility]) Comments: She is encouraged to strive for BMI less than 30 to decrease cardiac risk. Advised to aim for at least 150 minutes of exercise per week.   5. Screen for colon cancer Comments: She was given 3 stool cards to complete w/in next 2-3 weeks.  Patient was given opportunity to ask questions. Patient verbalized understanding of the plan and was able to repeat key elements of the plan. All questions were answered to their satisfaction.   I, Maximino Greenland, MD, have reviewed all documentation for this visit. The documentation on 03/11/20 for the exam, diagnosis, procedures, and orders are all accurate and complete.  THE PATIENT IS ENCOURAGED TO PRACTICE SOCIAL DISTANCING DUE TO THE COVID-19 PANDEMIC.

## 2020-03-11 NOTE — Patient Instructions (Addendum)
Voltaren gel - apply to affected joints two to three times per day as needed.   Cooking With Less Salt   Cooking with less salt is one way to reduce the amount of sodium you get from food. Sodium is one of the elements that make up salt. It is found naturally in foods and is also added to certain foods. Depending on your condition and overall health, your health care provider or dietitian may recommend that you reduce your sodium intake. Most people should have less than 2,300 milligrams (mg) of sodium each day. If you have high blood pressure (hypertension), you may need to limit your sodium to 1,500 mg each day. Follow the tips below to help reduce your sodium intake. What are tips for eating less sodium? Reading food labels  Check the food label before buying or using packaged ingredients. Always check the label for the serving size and sodium content.  Look for products with no more than 140 mg of sodium in one serving.  Check the % Daily Value column to see what percent of the daily recommended amount of sodium is provided in one serving of the product. Foods with 5% or less in this column are considered low in sodium. Foods with 20% or higher are considered high in sodium.  Do not choose foods with salt as one of the first three ingredients on the ingredients list. If salt is one of the first three ingredients, it usually means the item is high in sodium.   Shopping  Buy sodium-free or low-sodium products. Look for the following words on food labels: ? Low-sodium. ? Sodium-free. ? Reduced-sodium. ? No salt added. ? Unsalted.  Always check the sodium content even if foods are labeled as low-sodium or no salt added.  Buy fresh foods. Cooking  Use herbs, seasonings without salt, and spices as substitutes for salt.  Use sodium-free baking soda when baking.  Grill, braise, or roast foods to add flavor with less salt.  Avoid adding salt to pasta, rice, or hot cereals.  Drain and  rinse canned vegetables, beans, and meat before use.  Avoid adding salt when cooking sweets and desserts.  Cook with low-sodium ingredients. What foods are high in sodium? Vegetables Regular canned vegetables (not low-sodium or reduced-sodium). Sauerkraut, pickled vegetables, and relishes. Olives. [place] fries. Onion rings. Regular canned tomato sauce and paste. Regular tomato and vegetable juice. Frozen vegetables in sauces. Grains Instant hot cereals. Bread stuffing, pancake, and biscuit mixes. Croutons. Seasoned rice or pasta mixes. Noodle soup cups. Boxed or frozen macaroni and cheese. Regular salted crackers. Self-rising flour. Rolls. Bagels. Flour tortillas and wraps. Meats and other proteins Meat or fish that is salted, canned, smoked, cured, spiced, or pickled. This includes bacon, ham, sausages, hot dogs, corned beef, chipped beef, meat loaves, salt pork, jerky, pickled herring, anchovies, regular canned tuna, and sardines. Salted nuts. Dairy Processed cheese and cheese spreads. Cheese curds. Blue cheese. Feta cheese. String cheese. Regular cottage cheese. Buttermilk. Canned milk. The items listed above may not be a complete list of foods high in sodium. Actual amounts of sodium may be different depending on processing. Contact a dietitian for more information. What foods are low in sodium? Fruits Fresh, frozen, or canned fruit with no sauce added. Fruit juice. Vegetables Fresh or frozen vegetables with no sauce added. "No salt added" canned vegetables. "No salt added" tomato sauce and paste. Low-sodium or reduced-sodium tomato and vegetable juice. Grains Noodles, pasta, quinoa, rice. Shredded or puffed wheat or  puffed rice. Regular or quick oats (not instant). Low-sodium crackers. Low-sodium bread. Whole-grain bread and whole-grain pasta. Unsalted popcorn. Meats and other proteins Fresh or frozen whole meats, poultry (not injected with sodium), and fish with no sauce added. Unsalted  nuts. Dried peas, beans, and lentils without added salt. Unsalted canned beans. Eggs. Unsalted nut butters. Low-sodium canned tuna or chicken. Dairy Milk. Soy milk. Yogurt. Low-sodium cheeses, such as Swiss, [address], [place], and [employer]. Sherbet or ice cream (keep to  cup per serving). Cream cheese. Fats and oils Unsalted butter or margarine. Other foods Homemade pudding. Sodium-free baking soda and baking powder. Herbs and spices. Low-sodium seasoning mixes. Beverages Coffee and tea. Carbonated beverages. The items listed above may not be a complete list of foods low in sodium. Actual amounts of sodium may be different depending on processing. Contact a dietitian for more information. What are some salt alternatives when cooking? The following are herbs, seasonings, and spices that can be used instead of salt to flavor your food. Herbs should be fresh or dried. Do not choose packaged mixes. Next to the name of the herb, spice, or seasoning are some examples of foods you can pair it with. Herbs  Bay leaves - Soups, meat and vegetable dishes, and spaghetti sauce.  Basil - [employer], soups, pasta, and fish dishes.  Cilantro - Meat, poultry, and vegetable dishes.  Chili powder - Marinades and Mexican dishes.  Chives - Salad dressings and potato dishes.  Cumin - Mexican dishes, couscous, and meat dishes.  Dill - Fish dishes, sauces, and salads.  Fennel - Meat and vegetable dishes, breads, and cookies.  Garlic (do not use garlic salt) - [place] dishes, meat dishes, salad dressings, and sauces.  Marjoram - Soups, potato dishes, and meat dishes.  Oregano - Pizza and spaghetti sauce.  Parsley - Salads, soups, pasta, and meat dishes.  Rosemary - [place] dishes, salad dressings, soups, and red meats.  Saffron - Fish dishes, pasta, and some poultry dishes.  Sage - Stuffings and sauces.  Tarragon - Fish and [employer].  Thyme - Stuffing, meat, and fish  dishes. Seasonings  Lemon juice - Fish dishes, poultry dishes, vegetables, and salads.  Vinegar - Salad dressings, vegetables, and fish dishes. Spices  Cinnamon - Sweet dishes, such as cakes, cookies, and puddings.  Cloves - Gingerbread, puddings, and marinades for meats.  Curry - Vegetable dishes, fish and poultry dishes, and stir-fry dishes.  Ginger - Vegetable dishes, fish dishes, and stir-fry dishes.  Nutmeg - Pasta, vegetables, poultry, fish dishes, and custard. Summary  Cooking with less salt is one way to reduce the amount of sodium that you get from food.  Buy sodium-free or low-sodium products.  Check the food label before using or buying packaged ingredients.  Use herbs, seasonings without salt, and spices as substitutes for salt in foods. This information is not intended to replace advice given to you by your health care provider. Make sure you discuss any questions you have with your health care provider. Document Revised: 12/20/2018 Document Reviewed: 12/20/2018 Elsevier Patient Education  2021 [employer].

## 2020-03-12 LAB — CMP14+EGFR
ALT: 21 IU/L (ref 0–32)
AST: 19 IU/L (ref 0–40)
Albumin/Globulin Ratio: 1.6 (ref 1.2–2.2)
Albumin: 4.7 g/dL (ref 3.8–4.9)
Alkaline Phosphatase: 87 IU/L (ref 44–121)
BUN/Creatinine Ratio: 15 (ref 12–28)
BUN: 15 mg/dL (ref 8–27)
Bilirubin Total: 0.6 mg/dL (ref 0.0–1.2)
CO2: 21 mmol/L (ref 20–29)
Calcium: 9.9 mg/dL (ref 8.7–10.3)
Chloride: 106 mmol/L (ref 96–106)
Creatinine, Ser: 0.97 mg/dL (ref 0.57–1.00)
Globulin, Total: 2.9 g/dL (ref 1.5–4.5)
Glucose: 101 mg/dL — ABNORMAL HIGH (ref 65–99)
Potassium: 4.2 mmol/L (ref 3.5–5.2)
Sodium: 142 mmol/L (ref 134–144)
Total Protein: 7.6 g/dL (ref 6.0–8.5)
eGFR: 67 mL/min/{1.73_m2} (ref >59)

## 2020-03-12 LAB — HEMOGLOBIN A1C
Est. average glucose Bld gHb Est-mCnc: 134 mg/dL
Hgb A1c MFr Bld: 6.3 % — ABNORMAL HIGH (ref 4.8–5.6)

## 2020-03-13 ENCOUNTER — Telehealth (HOSPITAL_COMMUNITY): Payer: Self-pay

## 2020-03-13 DIAGNOSIS — Z0289 Encounter for other administrative examinations: Secondary | ICD-10-CM

## 2020-03-13 NOTE — Telephone Encounter (Signed)
Pt agreed to f/u in 6 months with a diagnostic angiogram. She is not currently having any sx. AW

## 2020-03-16 ENCOUNTER — Observation Stay (HOSPITAL_COMMUNITY): Payer: 59

## 2020-03-16 ENCOUNTER — Other Ambulatory Visit: Payer: Self-pay

## 2020-03-16 ENCOUNTER — Inpatient Hospital Stay (HOSPITAL_COMMUNITY)
Admission: EM | Admit: 2020-03-16 | Discharge: 2020-03-18 | DRG: 310 | Disposition: A | Discharge status: Home / Self Care | Payer: 59 | Attending: Family Medicine | Admitting: Family Medicine

## 2020-03-16 ENCOUNTER — Encounter (HOSPITAL_COMMUNITY): Payer: Self-pay | Admitting: Emergency Medicine

## 2020-03-16 ENCOUNTER — Emergency Department (HOSPITAL_COMMUNITY): Payer: 59

## 2020-03-16 DIAGNOSIS — E785 Hyperlipidemia, unspecified: Secondary | ICD-10-CM | POA: Diagnosis present

## 2020-03-16 DIAGNOSIS — E039 Hypothyroidism, unspecified: Secondary | ICD-10-CM | POA: Diagnosis present

## 2020-03-16 DIAGNOSIS — Z79899 Other long term (current) drug therapy: Secondary | ICD-10-CM

## 2020-03-16 DIAGNOSIS — Z8249 Family history of ischemic heart disease and other diseases of the circulatory system: Secondary | ICD-10-CM

## 2020-03-16 DIAGNOSIS — Z823 Family history of stroke: Secondary | ICD-10-CM

## 2020-03-16 DIAGNOSIS — E876 Hypokalemia: Secondary | ICD-10-CM

## 2020-03-16 DIAGNOSIS — Z7982 Long term (current) use of aspirin: Secondary | ICD-10-CM

## 2020-03-16 DIAGNOSIS — I4891 Unspecified atrial fibrillation: Secondary | ICD-10-CM | POA: Diagnosis not present

## 2020-03-16 DIAGNOSIS — I252 Old myocardial infarction: Secondary | ICD-10-CM

## 2020-03-16 DIAGNOSIS — I480 Paroxysmal atrial fibrillation: Secondary | ICD-10-CM | POA: Diagnosis not present

## 2020-03-16 DIAGNOSIS — I2510 Atherosclerotic heart disease of native coronary artery without angina pectoris: Secondary | ICD-10-CM | POA: Diagnosis not present

## 2020-03-16 DIAGNOSIS — R778 Other specified abnormalities of plasma proteins: Secondary | ICD-10-CM

## 2020-03-16 DIAGNOSIS — Z8673 Personal history of transient ischemic attack (TIA), and cerebral infarction without residual deficits: Secondary | ICD-10-CM

## 2020-03-16 DIAGNOSIS — G4730 Sleep apnea, unspecified: Secondary | ICD-10-CM | POA: Diagnosis present

## 2020-03-16 DIAGNOSIS — Z20822 Contact with and (suspected) exposure to covid-19: Secondary | ICD-10-CM | POA: Diagnosis present

## 2020-03-16 DIAGNOSIS — I270 Primary pulmonary hypertension: Secondary | ICD-10-CM

## 2020-03-16 DIAGNOSIS — M1711 Unilateral primary osteoarthritis, right knee: Secondary | ICD-10-CM | POA: Diagnosis present

## 2020-03-16 DIAGNOSIS — I10 Essential (primary) hypertension: Secondary | ICD-10-CM | POA: Diagnosis present

## 2020-03-16 DIAGNOSIS — R7989 Other specified abnormal findings of blood chemistry: Secondary | ICD-10-CM

## 2020-03-16 DIAGNOSIS — E7800 Pure hypercholesterolemia, unspecified: Secondary | ICD-10-CM | POA: Diagnosis present

## 2020-03-16 DIAGNOSIS — G43909 Migraine, unspecified, not intractable, without status migrainosus: Secondary | ICD-10-CM | POA: Diagnosis present

## 2020-03-16 LAB — BASIC METABOLIC PANEL
Anion gap: 10 (ref 5–15)
BUN: 13 mg/dL (ref 6–20)
CO2: 22 mmol/L (ref 22–32)
Calcium: 9.7 mg/dL (ref 8.9–10.3)
Chloride: 107 mmol/L (ref 98–111)
Creatinine, Ser: 1.07 mg/dL — ABNORMAL HIGH (ref 0.44–1.00)
GFR, Estimated: 59 mL/min — ABNORMAL LOW (ref >60)
Glucose, Bld: 146 mg/dL — ABNORMAL HIGH (ref 70–99)
Potassium: 4.4 mmol/L (ref 3.5–5.1)
Sodium: 139 mmol/L (ref 135–145)

## 2020-03-16 LAB — CBC
HCT: 39.6 % (ref 36.0–46.0)
Hemoglobin: 12.8 g/dL (ref 12.0–15.0)
MCH: 29.8 pg (ref 26.0–34.0)
MCHC: 32.3 g/dL (ref 30.0–36.0)
MCV: 92.1 fL (ref 80.0–100.0)
Platelets: 336 10*3/uL (ref 150–400)
RBC: 4.3 MIL/uL (ref 3.87–5.11)
RDW: 13.7 % (ref 11.5–15.5)
WBC: 8.9 10*3/uL (ref 4.0–10.5)
nRBC: 0 % (ref 0.0–0.2)

## 2020-03-16 LAB — MAGNESIUM: Magnesium: 2 mg/dL (ref 1.7–2.4)

## 2020-03-16 LAB — TROPONIN I (HIGH SENSITIVITY)
Troponin I (High Sensitivity): 20 ng/L — ABNORMAL HIGH (ref <18)
Troponin I (High Sensitivity): 21 ng/L — ABNORMAL HIGH (ref <18)

## 2020-03-16 LAB — RESP PANEL BY RT-PCR (FLU A&B, COVID) ARPGX2
Influenza A by PCR: NEGATIVE
Influenza B by PCR: NEGATIVE
SARS Coronavirus 2 by RT PCR: NEGATIVE

## 2020-03-16 IMAGING — MR MR HEAD W/O CM
12 of 13 series · 43 of 48 positions shown · non-contrast
Comparison: [DATE]

CLINICAL DATA: Chest pain, blurry vision, and right headache;
history of subarachnoid hemorrhage

EXAM:
MRI HEAD WITHOUT CONTRAST
TECHNIQUE: Multiplanar, multiecho pulse sequences of the brain and surrounding
structures were obtained without intravenous contrast.

[Series 5: DWI · axial · 3.0mm · 0.88mm/px · z∈[-103,+38]mm · 6 of 96 slices shown (1 of 4)]
[im 1/96]
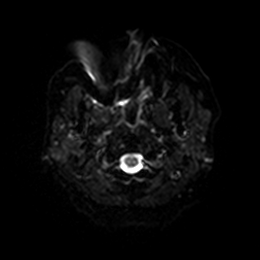
[im 20/96]
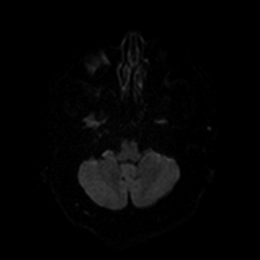
[im 39/96]
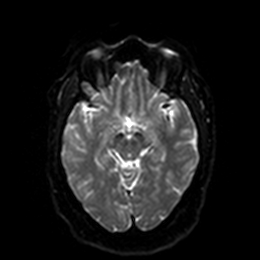
[im 58/96]
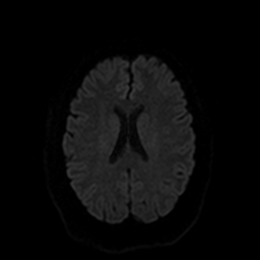
[im 77/96]
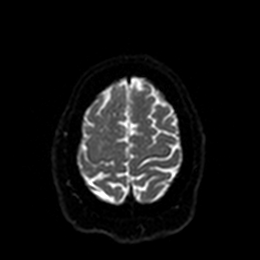
[im 96/96]
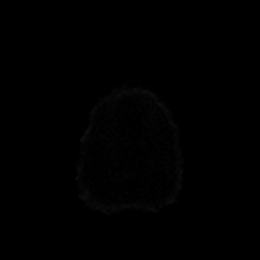

[Series 6: DWI · axial · 3.0mm · 0.88mm/px · z∈[-103,+38]mm · 3 of 48 slices shown (2 of 4)]
[im 1/48]
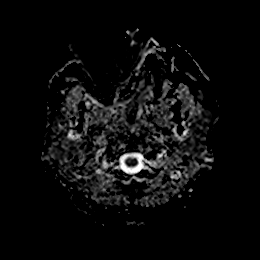
[im 24/48]
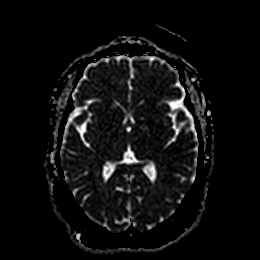
[im 48/48]
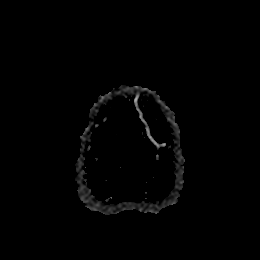

[Series 7: DWI · coronal · 4.0mm · 0.88mm/px · 5 of 68 slices shown (3 of 4)]
[im 1/68]
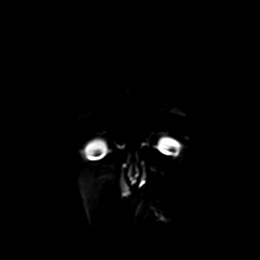
[im 17/68]
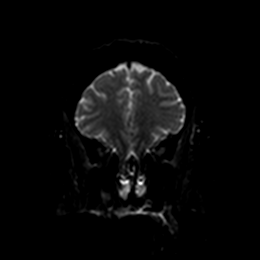
[im 34/68]
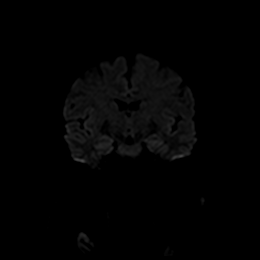
[im 51/68]
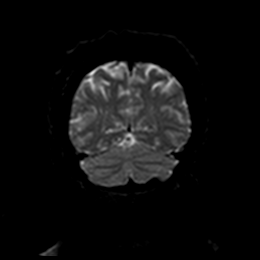
[im 68/68]
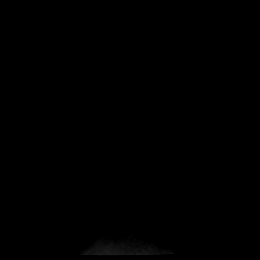

[Series 8: DWI · coronal · 4.0mm · 0.88mm/px · 3 of 34 slices shown (4 of 4)]
[im 1/34]
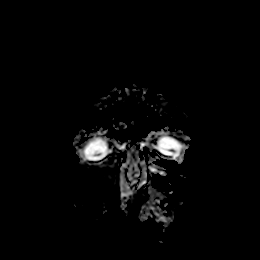
[im 17/34]
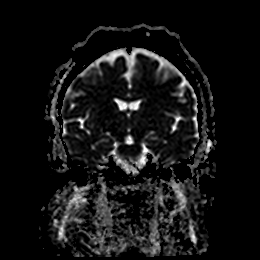
[im 34/34]
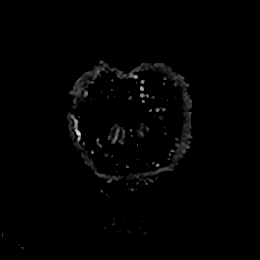

[Series 9: T1 · sagittal · 5.0mm · 0.75mm/px · 2 of 23 slices shown]
[im 1/23]
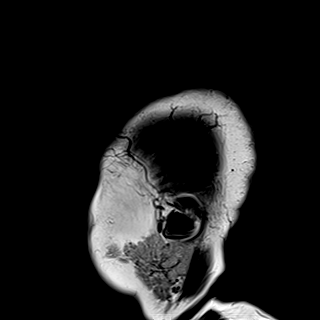
[im 23/23]
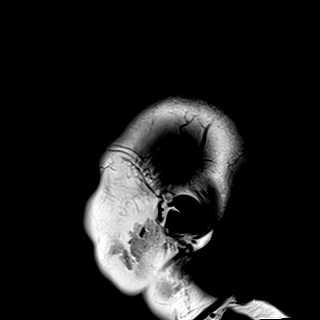

[Series 10: T2 · axial · 5.0mm · 0.72mm/px · z∈[-105,+39]mm · 2 of 25 slices shown (1 of 2)]
[im 1/25]
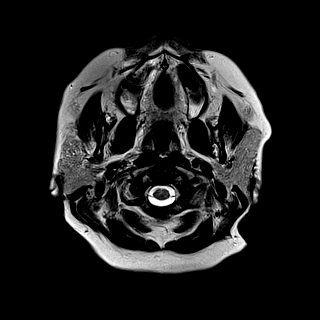
[im 25/25]
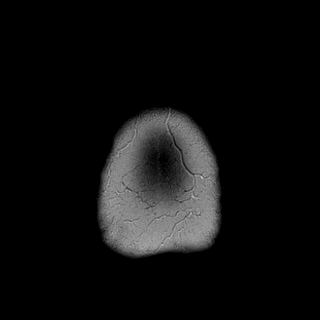

[Series 11: FLAIR · axial · 5.0mm · 0.45mm/px · z∈[-104,+40]mm · 2 of 25 slices shown]
[im 1/25]
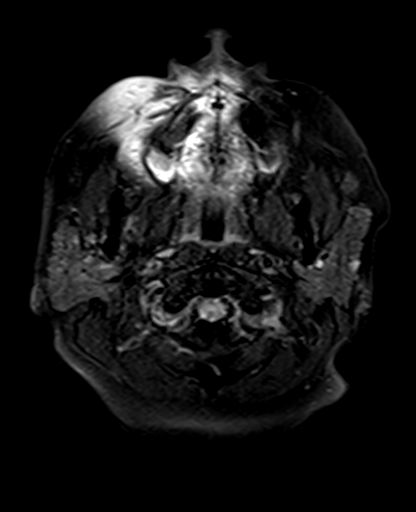
[im 25/25]
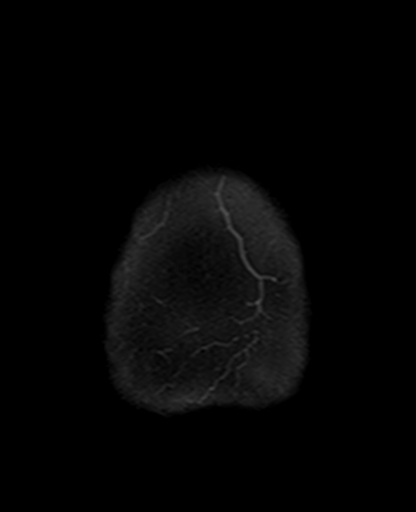

[Series 12: mag_images · axial · 3.0mm · 0.90mm/px · z∈[-118,+59]mm · 5 of 60 slices shown]
[im 1/60]
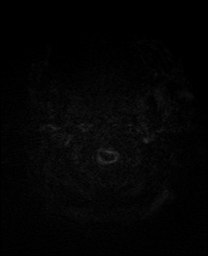
[im 15/60]
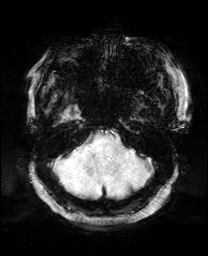
[im 30/60]
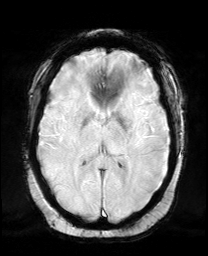
[im 45/60]
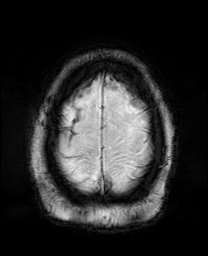
[im 60/60]
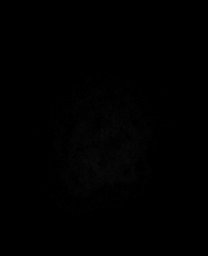

[Series 13: pha_images · axial · 3.0mm · 0.90mm/px · z∈[-115,+59]mm · 4 of 58 slices shown]
[im 1/58]
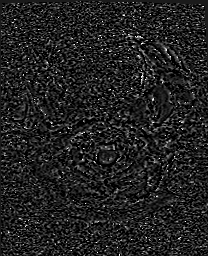
[im 20/58]
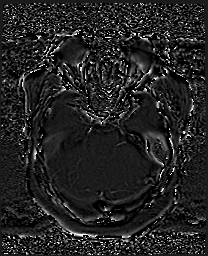
[im 39/58]
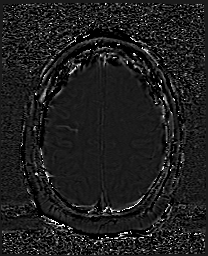
[im 58/58]
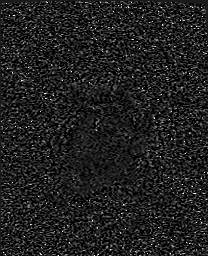

[Series 14: swi_images · axial · 3.0mm · 0.90mm/px · z∈[-118,+59]mm · 5 of 60 slices shown]
[im 1/60]
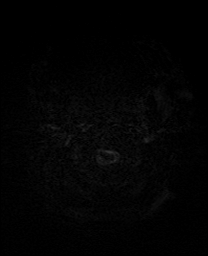
[im 15/60]
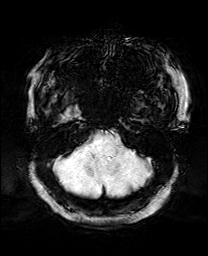
[im 30/60]
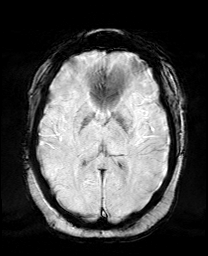
[im 45/60]
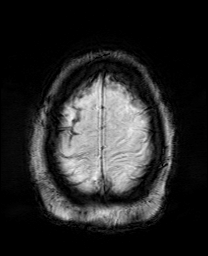
[im 60/60]
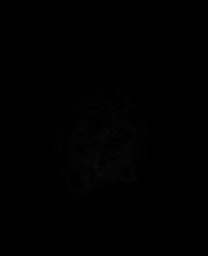

[Series 15: mip_images(sw) · axial · 24.0mm · 0.90mm/px · z∈[-108,+48]mm · 4 of 53 slices shown]
[im 1/53]
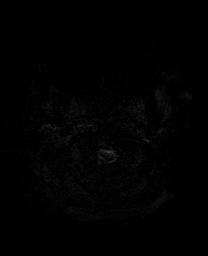
[im 18/53]
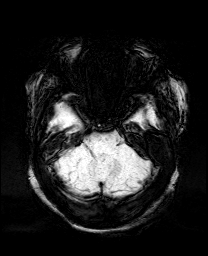
[im 35/53]
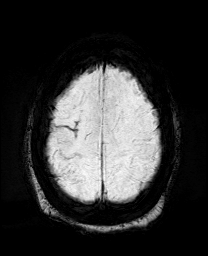
[im 53/53]
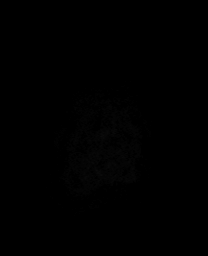

[Series 18: T2 · coronal · 5.0mm · 0.34mm/px · 2 of 29 slices shown (2 of 2)]
[im 1/29]
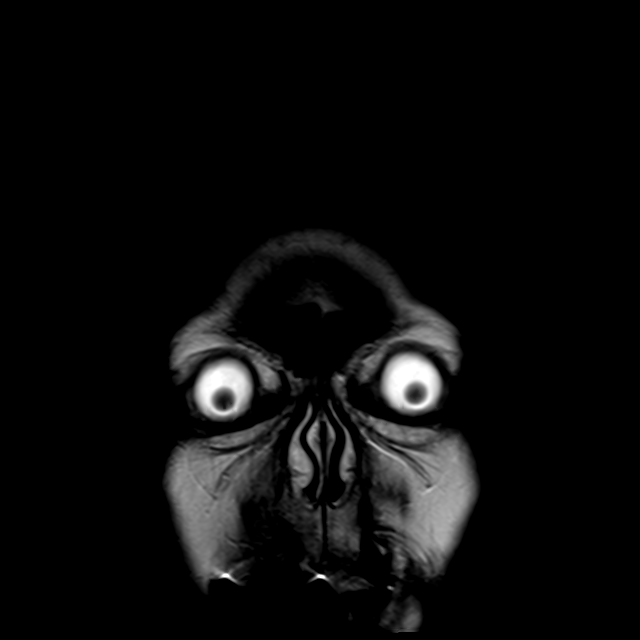
[im 29/29]
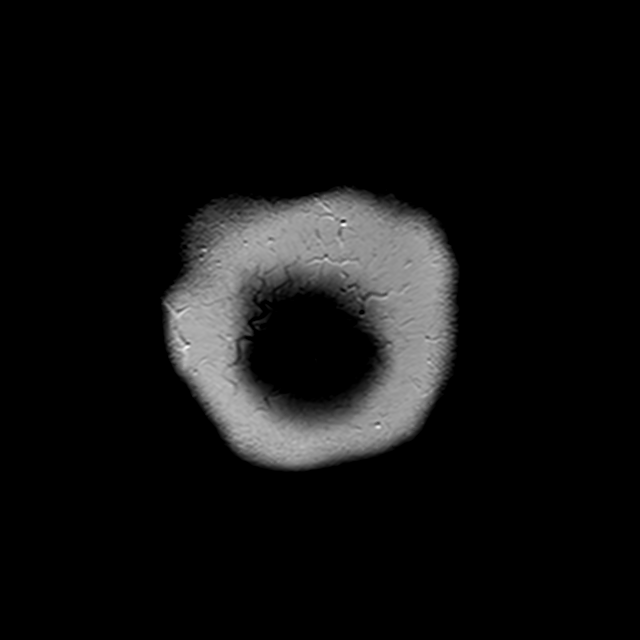

[43 of 48 positions shown; findings below may reference images not displayed]

FINDINGS: Brain: There is no acute infarction or intracranial hemorrhage.
There is no intracranial mass, mass effect, or edema. There is no
hydrocephalus or extra-axial fluid collection. Ventricles and sulci
are normal in size and configuration. Right frontoparietal sulcal
hemosiderin deposition reflecting prior subarachnoid hemorrhage.

Vascular: Loss of the expected flow void of majority of the right
internal carotid reflecting known occlusion with intracranial
reconstitution. Otherwise major vessel flow voids at the skull base
are preserved.

Skull and upper cervical spine: Normal marrow signal is preserved.

Sinuses/Orbits: Minor mucosal thickening.  Orbits are unremarkable.

Other: Sella is unremarkable.  Mastoid air cells are clear.
IMPRESSION: No acute infarction, hemorrhage, or mass.

## 2020-03-16 IMAGING — DX DG CHEST 1V PORT
1 series · 1 of 1 positions shown · non-contrast
Comparison: [DATE]

CLINICAL DATA: Chest pain

EXAM:
PORTABLE CHEST 1 VIEW

[chest]
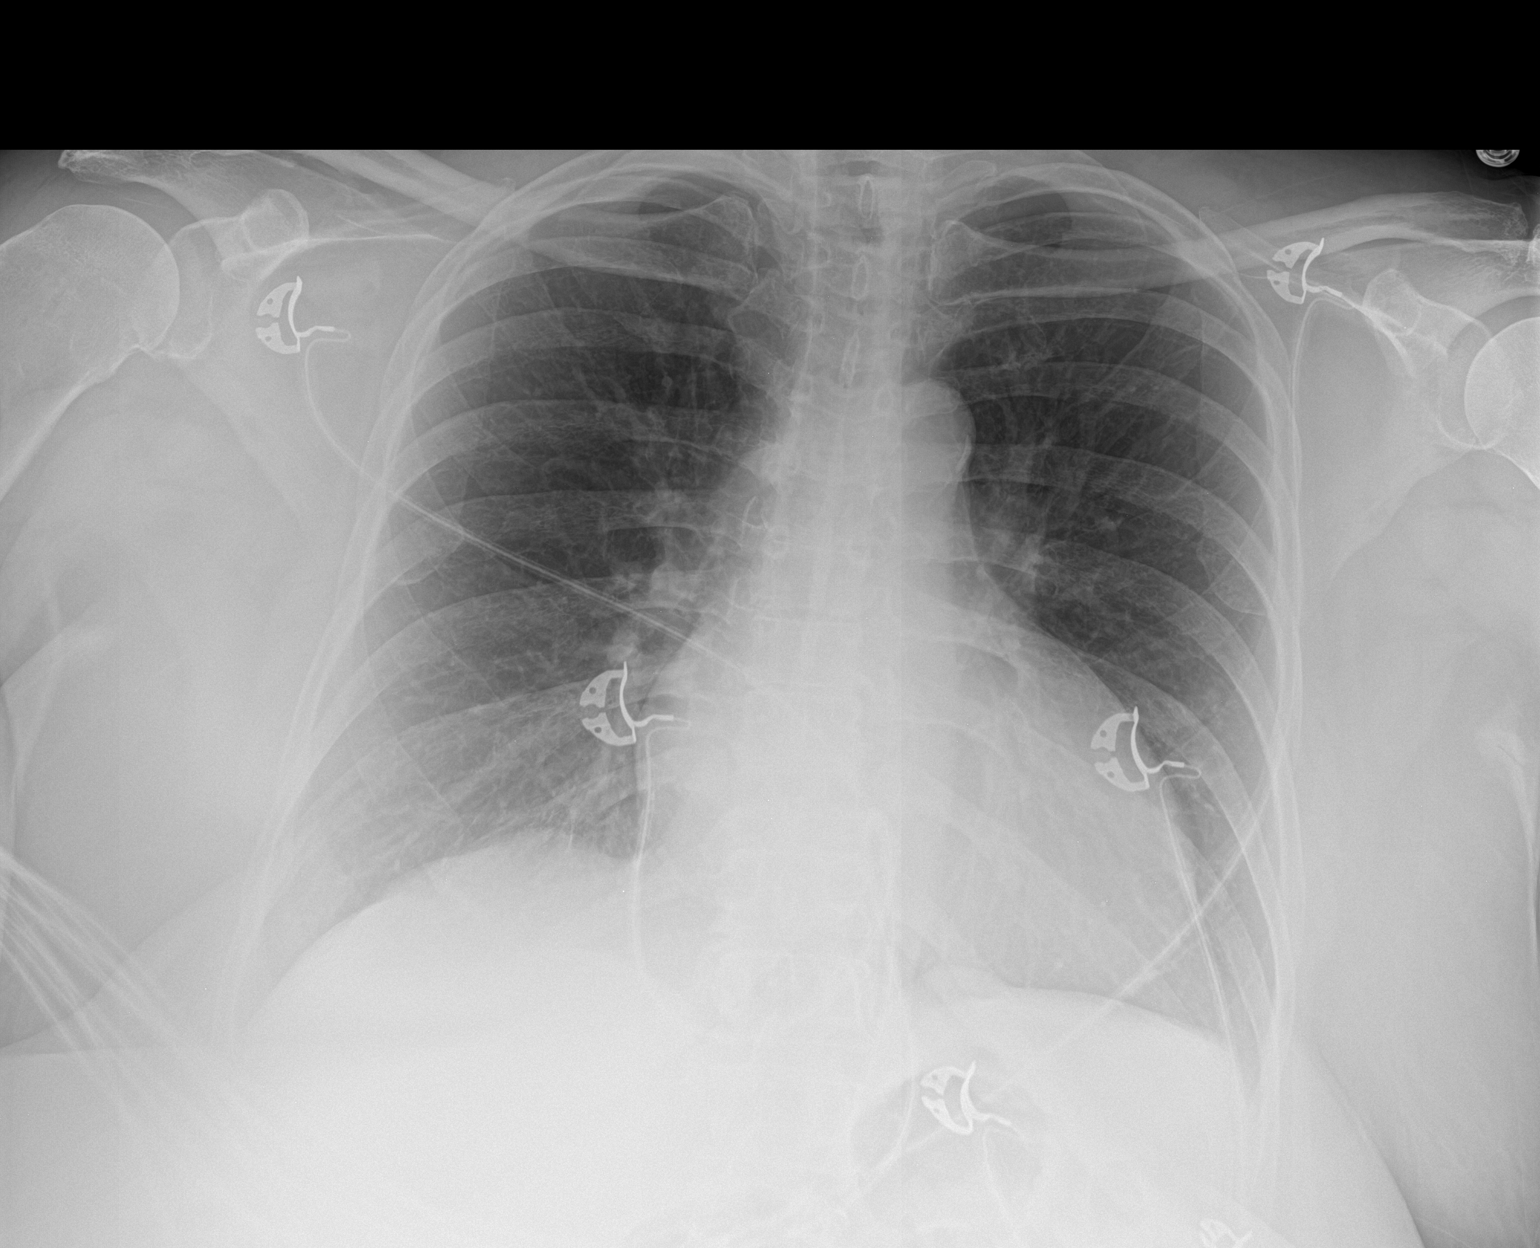

[1 of 1 positions shown; findings below may reference images not displayed]

FINDINGS: The cardiomediastinal silhouette is unchanged and mildly enlarged in
contour. No pleural effusion. No pneumothorax. No acute
pleuroparenchymal abnormality. Visualized abdomen is unremarkable.
Multilevel degenerative changes of the thoracic spine.
IMPRESSION: No acute cardiopulmonary abnormality.

## 2020-03-16 MED ORDER — ATORVASTATIN CALCIUM 80 MG PO TABS
80.0000 mg | ORAL_TABLET | Freq: Every day | ORAL | Status: DC
Start: 2020-03-16 — End: 2020-03-18
  Administered 2020-03-16 – 2020-03-17 (×2): 80 mg via ORAL
  Filled 2020-03-16 (×2): qty 1

## 2020-03-16 MED ORDER — CARVEDILOL 6.25 MG PO TABS: 6.2500 mg | ORAL_TABLET | Freq: Once | ORAL | Status: DC

## 2020-03-16 MED ORDER — DILTIAZEM LOAD VIA INFUSION
15.0000 mg | Freq: Once | INTRAVENOUS | Status: AC
  Administered 2020-03-16: 15 mg via INTRAVENOUS
  Filled 2020-03-16: qty 15

## 2020-03-16 MED ORDER — ACETAMINOPHEN 650 MG RE SUPP: 650.0000 mg | Freq: Four times a day (QID) | RECTAL | Status: DC | PRN

## 2020-03-16 MED ORDER — CARVEDILOL 6.25 MG PO TABS: 6.2500 mg | ORAL_TABLET | Freq: Two times a day (BID) | ORAL | Status: DC

## 2020-03-16 MED ORDER — DILTIAZEM HCL-DEXTROSE 125-5 MG/125ML-% IV SOLN (PREMIX)
5.0000 mg/h | INTRAVENOUS | Status: DC
  Administered 2020-03-16: 5 mg/h via INTRAVENOUS
  Filled 2020-03-16 (×2): qty 125

## 2020-03-16 MED ORDER — SODIUM CHLORIDE 0.9 % IV BOLUS: 500.0000 mL | Freq: Once | INTRAVENOUS | Status: DC

## 2020-03-16 MED ORDER — ACETAMINOPHEN 325 MG PO TABS
650.0000 mg | ORAL_TABLET | Freq: Four times a day (QID) | ORAL | Status: DC | PRN
  Administered 2020-03-16 – 2020-03-17 (×3): 650 mg via ORAL
  Filled 2020-03-16 (×3): qty 2

## 2020-03-16 MED ORDER — EZETIMIBE 10 MG PO TABS
10.0000 mg | ORAL_TABLET | Freq: Every day | ORAL | Status: DC
  Administered 2020-03-17 – 2020-03-18 (×2): 10 mg via ORAL
  Filled 2020-03-16 (×2): qty 1

## 2020-03-16 MED ORDER — SODIUM CHLORIDE 0.9 % IV BOLUS
1000.0000 mL | Freq: Once | INTRAVENOUS | Status: AC
  Administered 2020-03-16: 1000 mL via INTRAVENOUS

## 2020-03-16 NOTE — H&P (Addendum)
[facility] Ad[facility] History and Physical Service Pager: [phone]  Patient name: Rebekah HeadlandBlanche R Foss Medical record number: [id number] Date of birth: February 13, 1960 Age: 60 y.o. Gender: female  Primary Care Provider: Dorothyann PengSanders, Robyn, MD Consultants: Cardiology  Code Status: Full code Preferred Emergency Contact: Emogene MorganJames Perry: [phone]  Chief Complaint: chest pressure   Assessment and Plan: [name] is a 60 y.o. female presenting with increased chest pressure and palpitations, found to have new onset A. fib with RVR. PMH is significant for spontaneous arachnoid hemorrhage, CAD, HTN, hypothyroidism, depression.  New onset Afib with RVR  Patient presented with 1 day of chest pressure, palpitations and increased shortness of breath. She measured her HR at home and noted that it was 158 and she decided to come to the ED. On arrival, HR 153, BP 140/100. EKG showing A. fib with RVR. Troponin 20>21. CXR without cardiopulmonary abnormality. Cardizem drip started in ED. Patient received 1L NS bolus in ED. On physical exam HR irregularly irregular. Previous echo 08/08/19 with EF 60 to 65%. Patient has a history of CAD s/p PCI 07/2018 as well as a spontaneous arachnoid hemorrhage 07/2019. New onset a fib likely due to coronary artery disease. Consider PE given this new onset A. Fib though less likely, Wells score 1.5. and without other physical exam findings such as pleuritic chest pain, hypoxia.   Unlikely due to valvular disease since previous echo without valvular abnormalities. Also considered other causes of A fib RVR such as pericarditis, infection, thyrotoxicosis, MI etc Cardiology consulted.  -Admit to cardiac telemetry, attending Dr. Jennette KettleNeal  -Cardiology consulted, appreciate continued care and recommendations  -Vitals per floor routine  -Up with assistance -Continue Cardizem drip -500 cc NS bolus as Bps have been soft -Monitor Bps with Cardizem drip, further N.saline  bolus if necessary -A.m. BMP, CBC, Mag,TSH, lipid panel -Follow-up echo  Hx of Right MCA stroke  Spontaneous arachnoid hemorrhage July 27th 2021 presented with leg heaviness and facial numbness which head CT revealed a right-sided subarachnoid hemorrhage, in which she was treated with aspirin and Plavix.  Carotid angiogram showed right carotid occlusion 80 to 90%, and left stenosis at 30%.  Saw [facility] outpatient in August in which no additional intervention was recommended, only to repeat carotid duplex in 1 year [facility]. Last seen 6 months ago. Discussed with Dr Iver NestleBhagat, on call neurology regarding anticoagulation in setting of SAH. She recommended MRI wo contrast which was negative for acute infarction, hemorrhage, or mass. -Frequent neuro checks -Restart aspirin 81mg   Chest pain  Coronary artery disease s/p PCI Patient of Dr Duke Salviaandolph.  Patient had a left heart cath in 2018 that was normal, and another 1 in July 2020 due to recurrent chest pain which showed 85% mid left circumflex disease with 30 to 40% stenosis.  She underwent PCI of left circumflex.  Left ventriculography revealed LVEF 55 to 65%. Previous echo 08/08/19 with ejection fraction 60 to 65%. Troponins slightly elevated 20>21 likely 2/2 demand ischemia.  -Cardiology consulted, appreciate continued care and recommendations-given hx of SAH, they will defer to Dr Lalla BrothersLambert EP for potential watchman device to help eliminate needs for long term anti-coagulation. -F/u echo   Hypertension BP on admission 140/100.  Current BP stable at 104/84 Home medications include: Olmesartan-Amlodipine20-5-12.5mg , carvdelol 6.25mg  BID -Hold BP med given soft blood pressures -Restart as able  Hypercholesterolemia LDL 68 on 08/09/19 Home medication: Atorvastatin and Zetia -Continue atorvastatin and Zetia -F/u repeat lipid  panel  Headache  Patient with a history of chronic headaches.  Experienced headache this  morning on her right frontotemporal area that has since resolved. Home medication: Topamax 100mg  bedtime -Hold Topamax, can restart if she has headaches  FEN/GI: NPO, sips with meds  Prophylaxis: TBD by neuro. SCDs for now  Disposition: cardiac telemetry   History of Present Illness:  [name] is a 60 y.o. female presenting with constant chest pressure since yesterday evening.  Endorses intermittent chest pressure over the last few days that became more constant yesterday.  Has also been experiencing some palpitations. Intermittent chest pressure started yesterday at 10pm in the kitchen which she noticed when trying to walk to the living room. Pain is in the center of her chest, non radiating. Worse than pain she had when she had a cardiac stent previously. Relieved by laying flat. Sttill has the chest pressure and rates it 4/10.   Patient endorses blurred vision and right sided headache. Recent hx of right sided abdominal pain.   Drinks ETOH sometimes. Denies drug use or smoking.  Denies palpitations, dizziness, cough, dyspnea or fevers.  Covid vaccines: up to date and boosted.  Stays with son after hospital discharge. Independent ADLs.  Review Of Systems: Per HPI with the following additions:   Review of Systems  Constitutional: Negative for fever.  Respiratory: Negative for cough and wheezing.   Cardiovascular: Positive for chest pain. Negative for leg swelling.  Neurological: Positive for headaches.     Patient Active Problem List   Diagnosis Date Noted  . Atrial fibrillation with RVR (HCC) 03/16/2020  . Primary osteoarthritis of right knee 11/20/2019  . Cerebrovascular accident (CVA) (HCC) 10/18/2019  . ICAO (internal carotid artery occlusion), right 10/18/2019  . Primary osteoarthritis of left hip 09/11/2019  . SAH (subarachnoid hemorrhage) (HCC) 08/07/2019  . Acute embolic stroke (HCC)   . Left hip pain 06/25/2019  . Severe left groin pain 06/21/2019  .  Bilateral carotid artery stenosis 06/21/2019  . Pure hypercholesterolemia 05/31/2019  . CAD in native artery 04/09/2019  . Complicated migraine 08/23/2018  . Hyperglycemia 07/10/2018  . Abnormal stress test 08/06/2016  . Essential hypertension 06/30/2016  . Hypothyroidism 06/30/2016    Past Medical History: Past Medical History:  Diagnosis Date  . Allergy   . Arthritis    back   . Atypical chest pain 06/30/2016  . Back pain    DUE TO INJURY AT WORK ON05/2013  . Bruises easily   . Chronic lower back pain   . Coronary artery disease   . Depression    was on meds 2 yrs ago   . Headache(784.0)    rarely  . History of bronchitis    last time about 44yrs ago   . Hypertension    takes Benicar daily  . Hypothyroidism 06/30/2016  . Pure hypercholesterolemia 05/31/2019  . Vitamin D deficiency    takes Vitamin D 2 times/wk  . Weakness    and numbness right foot    Past Surgical History: Past Surgical History:  Procedure Laterality Date  . BACK SURGERY    . CARPAL TUNNEL RELEASE Right   . CESAREAN SECTION  1988  . COLONOSCOPY    . EXPLORATORY LAPAROTOMY  1991   "took out fatty tumor" (11/09/2012)  . IR ANGIO INTRA EXTRACRAN SEL COM CAROTID INNOMINATE BILAT MOD SED  08/10/2019  . IR ANGIO VERTEBRAL SEL VERTEBRAL BILAT MOD SED  08/10/2019  . IR 08/12/2019 GUIDE VASC ACCESS RIGHT  08/10/2019  .  LEFT HEART CATH AND CORONARY ANGIOGRAPHY N/A 08/06/2016   Procedure: Left Heart Cath and Coronary Angiography;  Surgeon: Lyn Records, MD;  Location: [facility] INVASIVE CV LAB;  Service: Cardiovascular;  Laterality: N/A;  . LEFT HEART CATH AND CORONARY ANGIOGRAPHY N/A 07/12/2018   Procedure: LEFT HEART CATH AND CORONARY ANGIOGRAPHY;  Surgeon: Marykay Lex, MD;  Location: [facility] INVASIVE CV LAB;  Service: Cardiovascular;  Laterality: N/A;  . LUMBAR LAMINECTOMY/DECOMPRESSION MICRODISCECTOMY  11/09/2012   "L3-4" (11/09/2012)  . LUMBAR LAMINECTOMY/DECOMPRESSION MICRODISCECTOMY N/A 11/09/2012   Procedure: L3-L4  DECOMPRESSION AND MICRODISCECTOMY   (1 LEVEL);  Surgeon: Venita Lick, MD;  Location: [facility] OR;  Service: Orthopedics;  Laterality: N/A;    Social History: Social History   Tobacco Use  . Smoking status: Never Smoker  . Smokeless tobacco: Never Used  Vaping Use  . Vaping Use: Never used  Substance Use Topics  . Alcohol use: Yes    Alcohol/week: 7.0 standard drinks    Types: 7 Glasses of wine per week    Comment: social  . Drug use: No   Additional social history:   Please also refer to relevant sections of EMR.  Family History: Family History  Problem Relation Age of Onset  . Heart disease Mother   . CAD Mother   . Stroke Mother   . Heart disease Sister   . Heart attack Sister   . Liver disease Brother   . Other Father        unknown medical history  . CAD Brother   . Colon cancer Neg Hx   . Esophageal cancer Neg Hx   . Rectal cancer Neg Hx   . Stomach cancer Neg Hx     Allergies and Medications: Allergies  Allergen Reactions  . Other Itching and Other (See Comments)    Patient experienced intense itching in her arm that was accessed for a past angiogram   No current facility-administered medications on file prior to encounter.   Current Outpatient Medications on File Prior to Encounter  Medication Sig Dispense Refill  . acetaminophen (TYLENOL) 500 MG tablet Take 500-1,000 mg by mouth every 6 (six) hours as needed (pain or headaches).    Marland Kitchen atorvastatin (LIPITOR) 80 MG tablet Take 1 tablet (80 mg total) by mouth daily at 6 PM. (Patient taking differently: Take 80 mg by mouth daily.) 90 tablet 2  . BAYER LOW DOSE 81 MG EC tablet Take 81 mg by mouth in the morning. Swallow whole.    . carvedilol (COREG) 6.25 MG tablet TAKE 1 TABLET (6.25 MG TOTAL) BY MOUTH 2 (TWO) TIMES DAILY WITH A MEAL. (Patient taking differently: Take 6.25 mg by mouth in the morning and at bedtime.) 180 tablet 2  . cholecalciferol (VITAMIN D3) 25 MCG (1000 UNIT) tablet Take 1,000 Units by mouth  daily.     Marland Kitchen ezetimibe (ZETIA) 10 MG tablet TAKE 1 TABLET BY MOUTH EVERY DAY (Patient taking differently: Take 10 mg by mouth daily.) 90 tablet 3  . fluticasone (FLONASE) 50 MCG/ACT nasal spray Place 2 sprays into both nostrils daily as needed for allergies or rhinitis. 16 g 1  . hydrOXYzine (ATARAX/VISTARIL) 25 MG tablet Take 1 tablet (25 mg total) by mouth 3 (three) times daily as needed. (Patient taking differently: Take 25 mg by mouth 3 (three) times daily as needed (AS DIRECTED).) 30 tablet 0  . levocetirizine (XYZAL) 5 MG tablet Take 1 tablet (5 mg total) by mouth daily. 90 tablet 1  . Magnesium 250  MG TABS TAKE 1 TABLET BY MOUTH DAILY WITH EVENING MEALS (Patient taking differently: Take 250 mg by mouth at bedtime.) 90 tablet 2  . meloxicam (MOBIC) 15 MG tablet Take 1 tablet (15 mg total) by mouth daily as needed for pain. 30 tablet 6  . Olmesartan-amLODIPine-HCTZ 20-5-12.5 MG TABS TAKE 1 TABLET BY MOUTH EVERY DAY (Patient taking differently: Take 1 tablet by mouth in the morning.) 90 tablet 1  . tiZANidine (ZANAFLEX) 4 MG tablet Take 1 tablet (4 mg total) by mouth every 8 (eight) hours as needed for muscle spasms. 30 tablet 0  . topiramate (TOPAMAX) 100 MG tablet Take 1 tablet (100 mg total) by mouth at bedtime. 90 tablet 4  . HYDROcodone-acetaminophen (NORCO/VICODIN) 5-325 MG tablet Take 1 tablet by mouth every 6 (six) hours as needed for moderate pain. (Patient not taking: No sig reported) 30 tablet 0  . mometasone (ELOCON) 0.1 % cream Apply to affected area daily (Patient not taking: No sig reported) 45 g 1    Objective: BP 104/84   Pulse 82   Temp 98.6 F (37 C) (Oral)   Resp 15   Ht 5\' 1"  (1.549 m)   Wt 88.5 kg   LMP 06/03/2011   SpO2 99%   BMI 36.84 kg/m   Exam: General: well appearing, NAD Eyes: EOMI ENTM: MMM Neck: supple, no JVD, normal ROM Cardiovascular: irregularly irregular. No murmurs  Respiratory: CTAB. Normal WOB  Gastrointestinal: abdomen soft, non distended,  non tender  MSK: Normal ROM and strength in extremities bilaterally  Derm: No visible lesions. Skin warm, dry without edema  Neuro: alert and oriented  Psych: mood and affect appropriate   Labs and Imaging: CBC BMET  Recent Labs  Lab 03/16/20 1119  WBC 8.9  HGB 12.8  HCT 39.6  PLT 336   Recent Labs  Lab 03/16/20 1119  NA 139  K 4.4  CL 107  CO2 22  BUN 13  CREATININE 1.07*  GLUCOSE 146*  CALCIUM 9.7     EKG: Atrial fibrillation with RVR. HR 146 bpm   DG Chest Portable 1 View  Result Date: 03/16/2020 CLINICAL DATA:  Chest pain EXAM: PORTABLE CHEST 1 VIEW COMPARISON:  March 10, 2019 FINDINGS: The cardiomediastinal silhouette is unchanged and mildly enlarged in contour. No pleural effusion. No pneumothorax. No acute pleuroparenchymal abnormality. Visualized abdomen is unremarkable. Multilevel degenerative changes of the thoracic spine. IMPRESSION: No acute cardiopulmonary abnormality. Electronically Signed   By: March 12, 2019 MD   On: 03/16/2020 11:46    05/16/2020, DO 03/16/2020, 3:18 PM PGY-1, [facility] FPTS Intern pager: [phone], text pages welcome  FPTS Upper-Level Resident Addendum   I have independently interviewed and examined the patient. I have discussed the above with the original author and agree with their documentation. My edits for correction/addition/clarification are in blue. Please see also any attending notes.   [phone] MD PGY-2, [facility] 03/16/2020 5:49 PM  FPTS Service pager: [phone] (text pages welcome through [facility])

## 2020-03-16 NOTE — Consult Note (Addendum)
Cardiology Consultation:   Patient ID: [name]; [id number]; [birth year]   Admit date: 03/16/2020 Date of Consult: 03/16/2020  Primary Care Provider: Dorothyann Peng, MD Primary Cardiologist: Dr. Duke Salvia, MD   Patient Profile:   [name] is a 60 y.o. female with a hx of CAD s/p PCI, carotid artery disease with right 80-90% stenosis and left 30% stenosis, right sided subarachnoid hemorrhage on ASA and Plavix and HTN who is being seen today for the evaluation of atrial fibrillation at the request of Dr. Idalia Wagner.   History of Present Illness:   [name] is a 60yo F with a hx as stated above who presented to [facility] 03/16/20 with centralized chest pressure that began yesterday evening about 10pm. She states that she was walking out of her kitchen when she had acute onset of chest pressure that was reminiscent of her prior MI. She took her BP that was normal but noticed that her HR was 158bpm. She proceeded to bed and woke this morning not feeling great but was going to attempt ti go to church. While getting dressed she had another episode of chest pressure. Given that this happen on two separate occasions, she presented to the ED for further evaluation. On presentation, she was found to be in new onset atrial fibrillation with RVR with a rate at 146bpm.   In the ED, EKG confirmed AF with RVR at a rate of 146bpm and no ischemic changes. HsT at 20 with no repeat trend. CXR with no cardiopulmonary abnormality. She was started on IV diltiazem with rate improvement to the 80-90's. She is currently chest pain free. She reports no anginal symptoms prior to last night. She is relatively active at baseline and reports that she stays busy with no SOB, LE edema, orthopnea or syncope. She is compliant with her medications.   She is followed by Dr. Duke Wagner for her cardiology care. She was initially seen in 2018 for an abnormal EKG and a strong family hx of CAD in her sistert at 60yo. She also has a  family hx of QT prolongation in her mother, aunt, and 2 cousins. She underwent an ETT 07/22/16 that revealed ST depressions with subsequent LHC that showed normal coronary arteries. Echocardiogram showed an LVEF 65-70% and grade 2 diastolic dysfunction.    She had repeat LHC during a hospitalization for recurrent chest pain 07/2018 which showed an 85% mid left circumflex disease with 30 to 40% stenoses in OM 2.  She underwent PCI of the left circumflex.  Left ventriculography revealed LVEF 55 to 65%.   Unfortunately she presented to the ED once again 08/07/19 with left leg heaviness and facial numbness that spontaneously resolved on ED arrival. Head CT revealed a right-sided subarachnoid hemorrhage.  eurosurgery did not feel that she was a candidate for intervention therefore she was treated with aspirin and Plavix.  She was also found to have 2 small right insular infarcts.  She underwent carotid angiogram that showed right carotid occlusion (80-90%) and left stenosis at 30%. She was referred to [facility] for further OP workup. She was seen in follow up 08/20/19 with no interventional plans at that time ad to repeat carotid duplex in one year timeframe.   Past Medical History:  Diagnosis Date  . Allergy   . Arthritis    back   . Atypical chest pain 06/30/2016  . Back pain    DUE TO INJURY AT WORK ON05/2013  . Bruises easily   . Chronic lower  back pain   . Coronary artery disease   . Depression    was on meds 2 yrs ago   . Headache(784.0)    rarely  . History of bronchitis    last time about 60yrs ago   . Hypertension    takes Benicar daily  . Hypothyroidism 06/30/2016  . Pure hypercholesterolemia 05/31/2019  . Vitamin D deficiency    takes Vitamin D 2 times/wk  . Weakness    and numbness right foot    Past Surgical History:  Procedure Laterality Date  . BACK SURGERY    . CARPAL TUNNEL RELEASE Right   . CESAREAN SECTION  1988  . COLONOSCOPY    . EXPLORATORY LAPAROTOMY  1991   "took out  fatty tumor" (11/09/2012)  . IR ANGIO INTRA EXTRACRAN SEL COM CAROTID INNOMINATE BILAT MOD SED  08/10/2019  . IR ANGIO VERTEBRAL SEL VERTEBRAL BILAT MOD SED  08/10/2019  . IR US GUIDE VASC ACCESS RIGHT  08/10/2019  . LEFT HEART CATH AND CORONARY ANGIOGRAPHY N/A 08/06/2016   Procedure: Left Heart Cath and Coronary Angiography;  Surgeon: Lyn Records, MD;  Location: [facility] INVASIVE CV LAB;  Service: Cardiovascular;  Laterality: N/A;  . LEFT HEART CATH AND CORONARY ANGIOGRAPHY N/A 07/12/2018   Procedure: LEFT HEART CATH AND CORONARY ANGIOGRAPHY;  Surgeon: Marykay Lex, MD;  Location: [facility] INVASIVE CV LAB;  Service: Cardiovascular;  Laterality: N/A;  . LUMBAR LAMINECTOMY/DECOMPRESSION MICRODISCECTOMY  11/09/2012   "L3-4" (11/09/2012)  . LUMBAR LAMINECTOMY/DECOMPRESSION MICRODISCECTOMY N/A 11/09/2012   Procedure: L3-L4 DECOMPRESSION AND MICRODISCECTOMY   (1 LEVEL);  Surgeon: Venita Lick, MD;  Location: [facility] OR;  Service: Orthopedics;  Laterality: N/A;     Prior to Admission medications   Medication Sig Start Date End Date Taking? Authorizing Provider  acetaminophen (TYLENOL) 500 MG tablet Take 500-1,000 mg by mouth every 6 (six) hours as needed (pain or headaches).   Yes [provider]  atorvastatin (LIPITOR) 80 MG tablet Take 1 tablet (80 mg total) by mouth daily at 6 PM. Patient taking differently: Take 80 mg by mouth daily. 09/18/19  Yes Rebekah Peng, MD  BAYER LOW DOSE 81 MG EC tablet Take 81 mg by mouth in the morning. Swallow whole.   Yes [provider]  carvedilol (COREG) 6.25 MG tablet TAKE 1 TABLET (6.25 MG TOTAL) BY MOUTH 2 (TWO) TIMES DAILY WITH A MEAL. Patient taking differently: Take 6.25 mg by mouth in the morning and at bedtime. 02/08/20  Yes Chilton Si, MD  cholecalciferol (VITAMIN D3) 25 MCG (1000 UNIT) tablet Take 1,000 Units by mouth daily.    Yes [provider]  ezetimibe (ZETIA) 10 MG tablet TAKE 1 TABLET BY MOUTH EVERY DAY Patient taking  differently: Take 10 mg by mouth daily. 11/02/19  Yes Chilton Si, MD  fluticasone [facility]) 50 MCG/ACT nasal spray Place 2 sprays into both nostrils daily as needed for allergies or rhinitis. 03/11/20  Yes Rebekah Peng, MD  hydrOXYzine (ATARAX/VISTARIL) 25 MG tablet Take 1 tablet (25 mg total) by mouth 3 (three) times daily as needed. Patient taking differently: Take 25 mg by mouth 3 (three) times daily as needed (AS DIRECTED). 08/21/19  Yes Arnette Felts, FNP  levocetirizine (XYZAL) 5 MG tablet Take 1 tablet (5 mg total) by mouth daily. 08/21/19  Yes Arnette Felts, FNP  Magnesium 250 MG TABS TAKE 1 TABLET BY MOUTH DAILY WITH EVENING MEALS Patient taking differently: Take 250 mg by mouth at bedtime. 03/11/20  Yes Rebekah Peng, MD  meloxicam (MOBIC) 15 MG tablet Take 1 tablet (15 mg total) by mouth daily as needed for pain. 06/21/19  Yes Levert Feinstein, MD  Olmesartan-amLODIPine-HCTZ 20-5-12.5 MG TABS TAKE 1 TABLET BY MOUTH EVERY DAY Patient taking differently: Take 1 tablet by mouth in the morning. 02/04/20  Yes Rebekah Peng, MD  tiZANidine (ZANAFLEX) 4 MG tablet Take 1 tablet (4 mg total) by mouth every 8 (eight) hours as needed for muscle spasms. 03/10/19  Yes Domenick Gong, MD  topiramate (TOPAMAX) 100 MG tablet Take 1 tablet (100 mg total) by mouth at bedtime. 06/21/19  Yes Levert Feinstein, MD  HYDROcodone-acetaminophen (NORCO/VICODIN) 5-325 MG tablet Take 1 tablet by mouth every 6 (six) hours as needed for moderate pain. Patient not taking: No sig reported 08/21/19   Arnette Felts, FNP  mometasone (ELOCON) 0.1 % cream Apply to affected area daily Patient not taking: No sig reported 08/21/19 08/20/20  Arnette Felts, FNP    Inpatient Medications: Scheduled Meds:  Continuous Infusions: . diltiazem (CARDIZEM) infusion 5 mg/hr (03/16/20 1309)   PRN Meds:   Allergies:    Allergies  Allergen Reactions  . Other Itching and Other (See Comments)    Patient experienced intense itching in her arm  that was accessed for a past angiogram    Social History:   Social History   Socioeconomic History  . Marital status: Divorced    Spouse name: Not on file  . Number of children: 1  . Years of education: Not on file  . Highest education level: Some college, no degree  Occupational History  . Occupation: Control and instrumentation engineer  Tobacco Use  . Smoking status: Never Smoker  . Smokeless tobacco: Never Used  Vaping Use  . Vaping Use: Never used  Substance and Sexual Activity  . Alcohol use: Yes    Alcohol/week: 7.0 standard drinks    Types: 7 Glasses of wine per week    Comment: social  . Drug use: No  . Sexual activity: Yes    Birth control/protection: Post-menopausal  Other Topics Concern  . Not on file  Social History Narrative   Right-handed.   Occasional caffeine.   Lives alone.   Social Determinants of Health   Financial Resource Strain: Not on file  Food Insecurity: Not on file  Transportation Needs: Not on file  Physical Activity: Not on file  Stress: Not on file  Social Connections: Not on file  Intimate Partner Violence: Not on file    Family History:   Family History  Problem Relation Age of Onset  . Heart disease Mother   . CAD Mother   . Stroke Mother   . Heart disease Sister   . Heart attack Sister   . Liver disease Brother   . Other Father        unknown medical history  . CAD Brother   . Colon cancer Neg Hx   . Esophageal cancer Neg Hx   . Rectal cancer Neg Hx   . Stomach cancer Neg Hx    Family Status:  Family Status  Relation Name Status  . Mother  Deceased  . Sister  Deceased  . Brother  Deceased  . Father  Deceased  . Sister  Alive  . Sister  Alive  . Sister  Alive  . Brother  Alive  . Brother  Alive  . Neg Hx  (Not Specified)    ROS:  Please see the history of present illness.  All other ROS reviewed and negative.  Physical Exam/Data:   Vitals:   03/16/20 1118 03/16/20 1200 03/16/20 1215 03/16/20 1245  BP:   94/67 97/78 (!) 124/107  Pulse:  94 (!) 105 (!) 142  Resp:  (!) 22 15 17   Temp:      TempSrc:      SpO2:  100% 99% 99%  Weight: 88.5 kg     Height: 5\' 1"  (1.549 m)      No intake or output data in the 24 hours ending 03/16/20 1419 Filed Weights   03/16/20 1118  Weight: 88.5 kg   Body mass index is 36.84 kg/m.   General: Well developed, well nourished, NAD Neck: Negative for carotid bruits. No JVD Lungs:Clear to ausculation bilaterally. No wheezes, rales, or rhonchi. Breathing is unlabored. Cardiovascular: Irregularly irregular. No murmurs Abdomen: Soft, non-tender, non-distended. No obvious abdominal masses. Extremities: No edema. Radial pulses 2+ bilaterally Neuro: Alert and oriented. No focal deficits. No facial asymmetry. MAE spontaneously. Psych: Responds to questions appropriately with normal affect.    EKG:  The EKG was personally reviewed and demonstrates: 3//6/22 AF with RVR with HR 146bpm and no acute changes.  Telemetry:  Telemetry was personally reviewed and demonstrates: 03/16/20 AF with HR in the 80-90's   Relevant CV Studies:  LHC 07/12/18:  CULPRIT LESION: Mid Cx lesion is 85% stenosed with 30-40% stenosed side branch in 2nd Mrg.  A drug-eluting stent was successfully placed (crossing OM2) using a STENT SYNERGY DES 3X20 - post-dilated to 3.3 mm  Post intervention, there is a 0% residual stenosis.  -----------------------------------------  Mid LAD lesion is 30% stenosed.  Prox RCA lesion is 25% stenosed.  -----------------------------------------  The left ventricular systolic function is normal. The left ventricular ejection fraction is 55-65% by visual estimate.  LV end diastolic pressure is normal.  SUMMARY  Severe Single Vessel CAD mLCX (@ OM2) - s/p Successful DES PCI (Synergy DES 3.0 x 20 -- 3.3 mm)  Otherwise mild disease in LAD & RCA  Normal LVEF & normal to low EDP  RECOMMENDATIONS  Overnight monitoring post-PCI with TR Band Removal    Continue aggressive Risk Factor Modification  Echo 07/13/16: Study Conclusions  - Left ventricle: The cavity size was normal. Wall thickness was normal. Systolic function was vigorous. The estimated ejection fraction was in the range of 65% to 70%. Wall motion was normal; there were no regional wall motion abnormalities. Features are consistent with a pseudonormal left ventricular filling pattern, with concomitant abnormal relaxation and increased filling pressure (grade 2 diastolic dysfunction). - Mitral valve: There was mild regurgitation. - Pulmonary arteries: Systolic pressure was moderately increased. PA peak pressure: 48 mm Hg (S).  ETT 07/22/16: Blood pressure demonstrated a normal response to exercise.  Clinically negative Electrically positive for ischemia with significant ST changes beginning in Stage II.  Excellent exercise tolerance  Echo 07/13/16: Study Conclusions  - Left ventricle: The cavity size was normal. Wall thickness was normal. Systolic function was vigorous. The estimated ejection fraction was in the range of 65% to 70%. Wall motion was normal; there were no regional wall motion abnormalities. Features are consistent with a pseudonormal left ventricular filling pattern, with concomitant abnormal relaxation and increased filling pressure (grade 2 diastolic dysfunction). - Mitral valve: There was mild regurgitation. - Pulmonary arteries: Systolic pressure was moderately increased. PA peak pressure: 48 mm Hg (S).  Laboratory Data:  Chemistry Recent Labs  Lab 03/11/20 1655 03/16/20 1119  NA 142 139  K 4.2 4.4  CL 106 107  CO2 21  22  GLUCOSE 101* 146*  BUN 15 13  CREATININE 0.97 1.07*  CALCIUM 9.9 9.7  GFRNONAA  --  59*  ANIONGAP  --  10    Total Protein  Date Value Ref Range Status  03/11/2020 7.6 6.0 - 8.5 g/dL Final   Albumin  Date Value Ref Range Status  03/11/2020 4.7 3.8 - 4.9 g/dL Final   AST   Date Value Ref Range Status  03/11/2020 19 0 - 40 IU/L Final   ALT  Date Value Ref Range Status  03/11/2020 21 0 - 32 IU/L Final   Alkaline Phosphatase  Date Value Ref Range Status  03/11/2020 87 44 - 121 IU/L Final   Bilirubin Total  Date Value Ref Range Status  03/11/2020 0.6 0.0 - 1.2 mg/dL Final   Hematology Recent Labs  Lab 03/16/20 1119  WBC 8.9  RBC 4.30  HGB 12.8  HCT 39.6  MCV 92.1  MCH 29.8  MCHC 32.3  RDW 13.7  PLT 336   Cardiac EnzymesNo results for input(s): TROPONINI in the last 168 hours. No results for input(s): TROPIPOC in the last 168 hours.  BNPNo results for input(s): BNP, PROBNP in the last 168 hours.  DDimer No results for input(s): DDIMER in the last 168 hours. TSH:  Lab Results  Component Value Date   TSH 1.871 08/07/2019   Lipids: Lab Results  Component Value Date   CHOL 115 08/09/2019   HDL 37 (L) 08/09/2019   LDLCALC 68 08/09/2019   TRIG 50 08/09/2019   CHOLHDL 3.1 08/09/2019   HgbA1c: Lab Results  Component Value Date   HGBA1C 6.3 (H) 03/11/2020    Radiology/Studies:  DG Chest Portable 1 View  Result Date: 03/16/2020 CLINICAL DATA:  Chest pain EXAM: PORTABLE CHEST 1 VIEW COMPARISON:  March 10, 2019 FINDINGS: The cardiomediastinal silhouette is unchanged and mildly enlarged in contour. No pleural effusion. No pneumothorax. No acute pleuroparenchymal abnormality. Visualized abdomen is unremarkable. Multilevel degenerative changes of the thoracic spine. IMPRESSION: No acute cardiopulmonary abnormality. Electronically Signed   By: Meda Klinefelter MD   On: 03/16/2020 11:46   Assessment and Plan:   1. New onset atrial fibrillation with RVR: -Pt presented to [facility] 03/16/20 with centralized chest pressure that began yesterday evening about 10pm. She states that she was walking out of her kitchen when she had acute onset of chest pressure that was reminiscent of her prior MI. She took her BP that was normal but noticed that her HR was  158bpm. She proceeded to bed and woke this morning not feeling great but was going to attempt ti go to church. While getting dressed she had another episode of chest pressure. Given that this happen on two separate occasions, she presented to the ED for further evaluation. On presentation, she was found to be in new onset atrial fibrillation with RVR with a rate at 146bpm.  -Started on IV diltiazem with rate improvement to the 80-90's however remains in AF  -Last echo 07/2019 with normal LVEF and no RWMA and no valvular disease -Will need neurology to weigh in on anticoagulation given acute subarachnoid hemorrhage last year. At that time she was taken off Plavix and contiuned on ASA.  -Depending on neuro recs, will either plan for OP DCCV after 3-4 weeks of AC or TEE/DCCV.   2. Hx of CAD with prior PCI: -Last cath 07/2018 with severe singly vessel CAD in the LCx artery with DES/PCI placement  -Last echo 07/2019 with stable LVEF at  60-65% with no RWMA and G1DD with mode PA systolic pressure and no valvular disease.  -Pt had chest pressure on presentation however suspect in the setting of RVR. Continue to trend HsT to ensure no dramatic elevation. May require stress test to assess for ischemia or infarct  -HsT 20>>>pending repeat labs -Continue ASA -Hold carvedilol for now due to soft BP>>likely due to diltiazem  -Will likely need antihypertensive adjustments prior to d/c   3. HLD: -Last LDL, 68 on 08/09/2019 -Continue high intensity statin therapy   4. HTN: -Stable, 104/84>>108/64>>103/69 -Hold PTA antihypertensives given soft BPs and follow closely -See plan for #2    For questions or updates, please contact [facility] Please consult [email or web address] for contact info under Cardiology/STEMI.   Raliegh Ip NP-C [facility] Pager: [phone] 03/16/2020 2:19 PM  Personally seen and examined. Agree with above.   60 year old here with paroxysmal atrial fibrillation, new discovery,  rapid ventricular response with associated chest discomfort transiently.  Has underlying coronary artery disease status post PCI to circumflex.   She also has prior subarachnoid hemorrhage associated with stroke in July 2021.  On exam, pleasant, irregularly irregular heart rhythm with telemetry demonstrating heart rates currently in the 90s.  Lungs are clear normal respiratory effort, no significant edema, alert and oriented x3.  Troponin was 20  Assessment and plan  Atrial fibrillation with rapid ventricular response, paroxysmal, new onset -Agree with IV diltiazem for rate control.  Hopeful auto conversion soon. -Given her prior subarachnoid hemorrhage associated with stroke, we will refer her to Dr. Lalla Brothers in EP for potential watchman device to help eliminate needs for long-term anticoagulation. -In the meantime, I have asked neurology to weigh in on risks for anticoagulation at this point. -This will help guide our next steps whether the be TEE cardioversion or outpatient cardioversion if she does not convert on her own.  CAD with prior PCI to circumflex -Chest pain may be associated with underlying A. fib with RVR.  Thus far, troponin is low at 20. -Continuing aspirin for now. -Holding carvedilol, administering diltiazem instead for atrial fibrillation rate control.  Hyperlipidemia -Continue with high intensity statin, LDL 68 previously.  Donato Schultz, MD

## 2020-03-16 NOTE — ED Provider Notes (Addendum)
[facility] EMERGENCY DEPARTMENT Provider Note   CSN: [id number] Arrival date & time: 03/16/20  1102    History Chief Complaint  Patient presents with  . Chest Pain    [name] is a 60 y.o. female with past history significant for spontaneous subarachnoid hemorrhage in July, normal stress test who presents for evaluation of chest pain.  Has been having intermittent chest pressure over the last few days, most notably constant since yesterday evening. Felt like pressure to her chest.  Has noted some intermittent palpitations.  No prior history of arrhythmias.  She is not anticoagulated.  Mild dizziness with exertion. No HA, vision changes, paresthesia, weakness. Does have history of hypertension.  No headache, lightheadedness, dizziness abdominal pain, diarrhea, dysuria, unilateral leg swelling, redness or warmth.  Rates her current pain a 7/10.  Has noted some intermittent shortness of breath.  Denies additional aggravating or alleviating factors.  History obtained from patient and past medical records.  No interpreter used.  Cards- [facility] for CAD  07/2018>>Cardiac cath Mid Cx 85% stenosis with DES with 0% residual stenosis, Mid LAD 30%, Prox RCA 25%  HPI     Past Medical History:  Diagnosis Date  . Allergy   . Arthritis    back   . Atypical chest pain 06/30/2016  . Back pain    DUE TO INJURY AT WORK ON05/2013  . Bruises easily   . Chronic lower back pain   . Coronary artery disease   . Depression    was on meds 2 yrs ago   . Headache(784.0)    rarely  . History of bronchitis    last time about 51yrs ago   . Hypertension    takes Benicar daily  . Hypothyroidism 06/30/2016  . Pure hypercholesterolemia 05/31/2019  . Vitamin D deficiency    takes Vitamin D 2 times/wk  . Weakness    and numbness right foot    Patient Active Problem List   Diagnosis Date Noted  . Atrial fibrillation with RVR (HCC) 03/16/2020  . Primary osteoarthritis of right  knee 11/20/2019  . Cerebrovascular accident (CVA) (HCC) 10/18/2019  . ICAO (internal carotid artery occlusion), right 10/18/2019  . Primary osteoarthritis of left hip 09/11/2019  . SAH (subarachnoid hemorrhage) (HCC) 08/07/2019  . Acute embolic stroke (HCC)   . Left hip pain 06/25/2019  . Severe left groin pain 06/21/2019  . Bilateral carotid artery stenosis 06/21/2019  . Pure hypercholesterolemia 05/31/2019  . CAD in native artery 04/09/2019  . Complicated migraine 08/23/2018  . Hyperglycemia 07/10/2018  . Abnormal stress test 08/06/2016  . Essential hypertension 06/30/2016  . Hypothyroidism 06/30/2016    Past Surgical History:  Procedure Laterality Date  . BACK SURGERY    . CARPAL TUNNEL RELEASE Right   . CESAREAN SECTION  1988  . COLONOSCOPY    . EXPLORATORY LAPAROTOMY  1991   "took out fatty tumor" (11/09/2012)  . IR ANGIO INTRA EXTRACRAN SEL COM CAROTID INNOMINATE BILAT MOD SED  08/10/2019  . IR ANGIO VERTEBRAL SEL VERTEBRAL BILAT MOD SED  08/10/2019  . IR US GUIDE VASC ACCESS RIGHT  08/10/2019  . LEFT HEART CATH AND CORONARY ANGIOGRAPHY N/A 08/06/2016   Procedure: Left Heart Cath and Coronary Angiography;  Surgeon: Lyn Records, MD;  Location: [facility] INVASIVE CV LAB;  Service: Cardiovascular;  Laterality: N/A;  . LEFT HEART CATH AND CORONARY ANGIOGRAPHY N/A 07/12/2018   Procedure: LEFT HEART CATH AND CORONARY ANGIOGRAPHY;  Surgeon: Marykay Lex, MD;  Location: [facility] INVASIVE CV LAB;  Service: Cardiovascular;  Laterality: N/A;  . LUMBAR LAMINECTOMY/DECOMPRESSION MICRODISCECTOMY  11/09/2012   "L3-4" (11/09/2012)  . LUMBAR LAMINECTOMY/DECOMPRESSION MICRODISCECTOMY N/A 11/09/2012   Procedure: L3-L4 DECOMPRESSION AND MICRODISCECTOMY   (1 LEVEL);  Surgeon: Venita Lick, MD;  Location: [facility] OR;  Service: Orthopedics;  Laterality: N/A;     OB History   No obstetric history on file.     Family History  Problem Relation Age of Onset  . Heart disease Mother   . CAD Mother   .  Stroke Mother   . Heart disease Sister   . Heart attack Sister   . Liver disease Brother   . Other Father        unknown medical history  . CAD Brother   . Colon cancer Neg Hx   . Esophageal cancer Neg Hx   . Rectal cancer Neg Hx   . Stomach cancer Neg Hx     Social History   Tobacco Use  . Smoking status: Never Smoker  . Smokeless tobacco: Never Used  Vaping Use  . Vaping Use: Never used  Substance Use Topics  . Alcohol use: Yes    Alcohol/week: 7.0 standard drinks    Types: 7 Glasses of wine per week    Comment: social  . Drug use: No    Home Medications Prior to Admission medications   Medication Sig Start Date End Date Taking? Authorizing Provider  acetaminophen (TYLENOL) 500 MG tablet Take 500-1,000 mg by mouth every 6 (six) hours as needed (pain or headaches).   Yes [provider]  atorvastatin (LIPITOR) 80 MG tablet Take 1 tablet (80 mg total) by mouth daily at 6 PM. Patient taking differently: Take 80 mg by mouth daily. 09/18/19  Yes Dorothyann Peng, MD  BAYER LOW DOSE 81 MG EC tablet Take 81 mg by mouth in the morning. Swallow whole.   Yes [provider]  carvedilol (COREG) 6.25 MG tablet TAKE 1 TABLET (6.25 MG TOTAL) BY MOUTH 2 (TWO) TIMES DAILY WITH A MEAL. Patient taking differently: Take 6.25 mg by mouth in the morning and at bedtime. 02/08/20  Yes Chilton Si, MD  cholecalciferol (VITAMIN D3) 25 MCG (1000 UNIT) tablet Take 1,000 Units by mouth daily.    Yes [provider]  ezetimibe (ZETIA) 10 MG tablet TAKE 1 TABLET BY MOUTH EVERY DAY Patient taking differently: Take 10 mg by mouth daily. 11/02/19  Yes Chilton Si, MD  fluticasone [facility]) 50 MCG/ACT nasal spray Place 2 sprays into both nostrils daily as needed for allergies or rhinitis. 03/11/20  Yes Dorothyann Peng, MD  hydrOXYzine (ATARAX/VISTARIL) 25 MG tablet Take 1 tablet (25 mg total) by mouth 3 (three) times daily as needed. Patient taking differently: Take 25 mg by  mouth 3 (three) times daily as needed (AS DIRECTED). 08/21/19  Yes Arnette Felts, FNP  levocetirizine (XYZAL) 5 MG tablet Take 1 tablet (5 mg total) by mouth daily. 08/21/19  Yes Arnette Felts, FNP  Magnesium 250 MG TABS TAKE 1 TABLET BY MOUTH DAILY WITH EVENING MEALS Patient taking differently: Take 250 mg by mouth at bedtime. 03/11/20  Yes Dorothyann Peng, MD  meloxicam (MOBIC) 15 MG tablet Take 1 tablet (15 mg total) by mouth daily as needed for pain. 06/21/19  Yes Levert Feinstein, MD  Olmesartan-amLODIPine-HCTZ 20-5-12.5 MG TABS TAKE 1 TABLET BY MOUTH EVERY DAY Patient taking differently: Take 1 tablet by mouth in the morning. 02/04/20  Yes Dorothyann Peng, MD  tiZANidine (ZANAFLEX) 4  MG tablet Take 1 tablet (4 mg total) by mouth every 8 (eight) hours as needed for muscle spasms. 03/10/19  Yes Domenick GongMortenson, Ashley, MD  topiramate (TOPAMAX) 100 MG tablet Take 1 tablet (100 mg total) by mouth at bedtime. 06/21/19  Yes Levert FeinsteinYan, Yijun, MD  HYDROcodone-acetaminophen (NORCO/VICODIN) 5-325 MG tablet Take 1 tablet by mouth every 6 (six) hours as needed for moderate pain. Patient not taking: No sig reported 08/21/19   Arnette FeltsMoore, Janece, FNP  mometasone (ELOCON) 0.1 % cream Apply to affected area daily Patient not taking: No sig reported 08/21/19 08/20/20  Arnette FeltsMoore, Janece, FNP    Allergies    Other  Review of Systems   Review of Systems  Constitutional: Negative.   HENT: Negative.   Respiratory: Positive for shortness of breath. Negative for cough.   Cardiovascular: Positive for chest pain and palpitations. Negative for leg swelling.  Gastrointestinal: Negative.   Genitourinary: Negative.   Musculoskeletal: Negative.   Skin: Negative.   Neurological: Negative.   All other systems reviewed and are negative.   Physical Exam Updated Vital Signs BP 104/84   Pulse 82   Temp 98.6 F (37 C) (Oral)   Resp 15   Ht 5\' 1"  (1.549 m)   Wt 88.5 kg   LMP 06/03/2011   SpO2 99%   BMI 36.84 kg/m   Physical Exam Vitals and  nursing note reviewed.  Constitutional:      General: She is not in acute distress.    Appearance: She is well-developed and well-nourished. She is not ill-appearing, toxic-appearing or diaphoretic.  HENT:     Head: Atraumatic.  Eyes:     Pupils: Pupils are equal, round, and reactive to light.  Cardiovascular:     Rate and Rhythm: Tachycardia present. Rhythm irregular.     Pulses: Intact distal pulses.          Radial pulses are 2+ on the right side and 2+ on the left side.       Dorsalis pedis pulses are 2+ on the right side and 2+ on the left side.     Heart sounds: Normal heart sounds.  Pulmonary:     Effort: Pulmonary effort is normal. No respiratory distress.     Breath sounds: Normal breath sounds.     Comments: Speaks in full sentences without difficulty Abdominal:     General: Bowel sounds are normal. There is no distension.     Palpations: Abdomen is soft. There is no mass.     Tenderness: There is no abdominal tenderness. There is no guarding or rebound.     Comments: Soft, nontender without rebound or guarding  Musculoskeletal:        General: Normal range of motion.     Cervical back: Normal range of motion.     Right lower leg: No tenderness. No edema.     Left lower leg: No tenderness. No edema.     Comments: Moves all 4 extremities at difficulty.  Compartments soft.  No bony tenderness.  No clinical evidence of DVT  Skin:    General: Skin is warm and dry.     Capillary Refill: Capillary refill takes less than 2 seconds.  Neurological:     General: No focal deficit present.     Mental Status: She is alert.     Cranial Nerves: Cranial nerves are intact.     Sensory: Sensation is intact.     Motor: Motor function is intact.     Coordination: Coordination is  intact.     Gait: Gait is intact.     Comments: Cranial nerves II through grossly intact Ambulatory in room without any ataxic gait  Psychiatric:        Mood and Affect: Mood and affect normal.     ED  Results / Procedures / Treatments   Labs (all labs ordered are listed, but only abnormal results are displayed) Labs Reviewed  BASIC METABOLIC PANEL - Abnormal; Notable for the following components:      Result Value   Glucose, Bld 146 (*)    Creatinine, Ser 1.07 (*)    GFR, Estimated 59 (*)    All other components within normal limits  TROPONIN I (HIGH SENSITIVITY) - Abnormal; Notable for the following components:   Troponin I (High Sensitivity) 20 (*)    All other components within normal limits  TROPONIN I (HIGH SENSITIVITY) - Abnormal; Notable for the following components:   Troponin I (High Sensitivity) 21 (*)    All other components within normal limits  RESP PANEL BY RT-PCR (FLU A&B, COVID) ARPGX2  CBC  MAGNESIUM    EKG EKG Interpretation  Date/Time:  Sunday March 16 2020 11:07:45 EST Ventricular Rate:  146 PR Interval:    QRS Duration: 76 QT Interval:  286 QTC Calculation: 445 R Axis:   42 Text Interpretation: Atrial fibrillation with rapid ventricular response Nonspecific ST and T wave abnormality Confirmed by Cathren Laine ([id number]) on 03/16/2020 12:00:36 PM   Radiology DG Chest Portable 1 View  Result Date: 03/16/2020 CLINICAL DATA:  Chest pain EXAM: PORTABLE CHEST 1 VIEW COMPARISON:  March 10, 2019 FINDINGS: The cardiomediastinal silhouette is unchanged and mildly enlarged in contour. No pleural effusion. No pneumothorax. No acute pleuroparenchymal abnormality. Visualized abdomen is unremarkable. Multilevel degenerative changes of the thoracic spine. IMPRESSION: No acute cardiopulmonary abnormality. Electronically Signed   By: Meda Klinefelter MD   On: 03/16/2020 11:46    Procedures .Critical Care Performed by: Linwood Dibbles, PA-C Authorized by: Linwood Dibbles, PA-C   Critical care provider statement:    Critical care time (minutes):  45   Critical care was necessary to treat or prevent imminent or life-threatening deterioration of the following  conditions:  Cardiac failure   Critical care was time spent personally by me on the following activities:  Discussions with consultants, evaluation of patient's response to treatment, examination of patient, ordering and performing treatments and interventions, ordering and review of laboratory studies, ordering and review of radiographic studies, pulse oximetry, re-evaluation of patient's condition, obtaining history from patient or surrogate and review of old charts     Medications Ordered in ED Medications  diltiazem (CARDIZEM) 1 mg/mL load via infusion 15 mg (15 mg Intravenous Bolus from Bag 03/16/20 1310)    And  diltiazem (CARDIZEM) 125 mg in dextrose 5% 125 mL (1 mg/mL) infusion (5 mg/hr Intravenous New Bag/Given 03/16/20 1309)  acetaminophen (TYLENOL) tablet 650 mg (has no administration in time range)    Or  acetaminophen (TYLENOL) suppository 650 mg (has no administration in time range)  sodium chloride 0.9 % bolus 1,000 mL (1,000 mLs Intravenous New Bag/Given 03/16/20 1224)    ED Course  I have reviewed the triage vital signs and the nursing notes.  Pertinent labs & imaging results that were available during my care of the patient were reviewed by me and considered in my medical decision making (see chart for details).  Patient here with new onset Afib with RVR, Sx intermittent for the last few  days, greater than 24 hours. No exertional CP, Wo radiation to left arm, back or jaw. Appear comfortable in room however with Afib with HR in the 130's and 140. Does have hx of spontaneous head bleed 8 months ago, no currently anticoagulated.  Possible greater than 24-hour start time not candidate for cardioversion.  Plan on labs, start Cardizem and likely admit.  Labs and imaging personally reviewed and interpreted:  CBC without leukocytosis BMP with glucose 146, creatinine 1.07 Trop 20>>21 EKG A. fib RVR DG chest wo acute abnormality  Patient reassessed.  Drop in BP however multiple blood  pressures after this have been greater than 120 systolic.  Will start Cardizem drip.  Will give some fluids.  Discussed with nursing close monitoring of blood pressure.  [place] Vasc 4-- Hold on Anticoagulation in ED, defer to Cards given recent spontaneous head bleed  CONSULT  with FM teaching who agrees to evaluate patient for admission.  Have requested I consult with cardiology.  Consult called.  CONSULT with Dr. Anne Fu with cards who is aware of patient. Rec consult with Neuro to see if patient can be anticoagulated given prior headbleed  CONSULT with Dr. Iver Nestle with Neuro. Will review patient chart and let admitting team aware of timing of anticoagulation.  CONSULT with FM teaching, aware of TBD anticoagulation per Neuro rec   The patient appears reasonably stabilized for admission considering the current resources, flow, and capabilities available in the ED at this time, and I doubt any other [facility] requiring further screening and/or treatment in the ED prior to admission.  Patient discussed with attending, Dr. Denton Lank who agrees with above treatment, plan and disposition    MDM Rules/Calculators/A&P     CHA2DS2-VASc Score: 4                     [name] was evaluated in Emergency Department on 03/16/2020 for the symptoms described in the history of present illness. She was evaluated in the context of the global COVID-19 pandemic, which necessitated consideration that the patient might be at risk for infection with the SARS-CoV-2 virus that causes COVID-19. Institutional protocols and algorithms that pertain to the evaluation of patients at risk for COVID-19 are in a state of rapid change based on information released by regulatory bodies including the CDC and federal and state organizations. These policies and algorithms were followed during the patient's care in the ED. Final Clinical Impression(s) / ED Diagnoses Final diagnoses:  Atrial fibrillation with RVR (HCC)  Elevated troponin     Rx / DC Orders ED Discharge Orders         Ordered    Amb referral to AFIB Clinic        03/16/20 1200             Diego Delancey A, PA-C 03/16/20 1512    Cathren Laine, MD 03/16/20 1612

## 2020-03-16 NOTE — Plan of Care (Signed)
Progressing, will continue to monitor.  

## 2020-03-16 NOTE — ED Notes (Signed)
Pt to MRI accompanied by this RN.

## 2020-03-16 NOTE — ED Triage Notes (Signed)
C/o pressure to center of chest since last night.  Exertional SOB this morning.  Afib RVR @146 .  Pt to treatment room.

## 2020-03-17 ENCOUNTER — Observation Stay (HOSPITAL_BASED_OUTPATIENT_CLINIC_OR_DEPARTMENT_OTHER): Payer: 59

## 2020-03-17 DIAGNOSIS — G4733 Obstructive sleep apnea (adult) (pediatric): Secondary | ICD-10-CM

## 2020-03-17 DIAGNOSIS — G43909 Migraine, unspecified, not intractable, without status migrainosus: Secondary | ICD-10-CM | POA: Diagnosis present

## 2020-03-17 DIAGNOSIS — G4730 Sleep apnea, unspecified: Secondary | ICD-10-CM | POA: Diagnosis present

## 2020-03-17 DIAGNOSIS — I361 Nonrheumatic tricuspid (valve) insufficiency: Secondary | ICD-10-CM | POA: Diagnosis not present

## 2020-03-17 DIAGNOSIS — I270 Primary pulmonary hypertension: Secondary | ICD-10-CM

## 2020-03-17 DIAGNOSIS — I2510 Atherosclerotic heart disease of native coronary artery without angina pectoris: Secondary | ICD-10-CM | POA: Diagnosis present

## 2020-03-17 DIAGNOSIS — I4891 Unspecified atrial fibrillation: Secondary | ICD-10-CM | POA: Diagnosis not present

## 2020-03-17 DIAGNOSIS — I252 Old myocardial infarction: Secondary | ICD-10-CM | POA: Diagnosis not present

## 2020-03-17 DIAGNOSIS — I10 Essential (primary) hypertension: Secondary | ICD-10-CM | POA: Diagnosis present

## 2020-03-17 DIAGNOSIS — Z823 Family history of stroke: Secondary | ICD-10-CM | POA: Diagnosis not present

## 2020-03-17 DIAGNOSIS — I6523 Occlusion and stenosis of bilateral carotid arteries: Secondary | ICD-10-CM

## 2020-03-17 DIAGNOSIS — I340 Nonrheumatic mitral (valve) insufficiency: Secondary | ICD-10-CM

## 2020-03-17 DIAGNOSIS — E876 Hypokalemia: Secondary | ICD-10-CM

## 2020-03-17 DIAGNOSIS — Z8249 Family history of ischemic heart disease and other diseases of the circulatory system: Secondary | ICD-10-CM | POA: Diagnosis not present

## 2020-03-17 DIAGNOSIS — Z79899 Other long term (current) drug therapy: Secondary | ICD-10-CM | POA: Diagnosis not present

## 2020-03-17 DIAGNOSIS — I480 Paroxysmal atrial fibrillation: Secondary | ICD-10-CM | POA: Diagnosis present

## 2020-03-17 DIAGNOSIS — I351 Nonrheumatic aortic (valve) insufficiency: Secondary | ICD-10-CM | POA: Diagnosis not present

## 2020-03-17 DIAGNOSIS — R778 Other specified abnormalities of plasma proteins: Secondary | ICD-10-CM | POA: Diagnosis present

## 2020-03-17 DIAGNOSIS — E785 Hyperlipidemia, unspecified: Secondary | ICD-10-CM | POA: Diagnosis present

## 2020-03-17 DIAGNOSIS — Z20822 Contact with and (suspected) exposure to covid-19: Secondary | ICD-10-CM | POA: Diagnosis present

## 2020-03-17 DIAGNOSIS — I609 Nontraumatic subarachnoid hemorrhage, unspecified: Secondary | ICD-10-CM | POA: Diagnosis not present

## 2020-03-17 DIAGNOSIS — E039 Hypothyroidism, unspecified: Secondary | ICD-10-CM | POA: Diagnosis present

## 2020-03-17 DIAGNOSIS — Z8673 Personal history of transient ischemic attack (TIA), and cerebral infarction without residual deficits: Secondary | ICD-10-CM | POA: Diagnosis not present

## 2020-03-17 DIAGNOSIS — E7800 Pure hypercholesterolemia, unspecified: Secondary | ICD-10-CM | POA: Diagnosis present

## 2020-03-17 DIAGNOSIS — M1711 Unilateral primary osteoarthritis, right knee: Secondary | ICD-10-CM | POA: Diagnosis present

## 2020-03-17 DIAGNOSIS — Z7982 Long term (current) use of aspirin: Secondary | ICD-10-CM | POA: Diagnosis not present

## 2020-03-17 LAB — TSH: TSH: 1.978 u[IU]/mL (ref 0.350–4.500)

## 2020-03-17 LAB — ECHOCARDIOGRAM COMPLETE
AR max vel: 1.11 cm2
AV Area VTI: 1.26 cm2
AV Area mean vel: 1.22 cm2
AV Mean grad: 12 mmHg
AV Peak grad: 22.7 mmHg
Ao pk vel: 2.38 m/s
Area-P 1/2: 2.54 cm2
Height: 61 in
P 1/2 time: 443 msec
S' Lateral: 2 cm
Weight: 3120 oz

## 2020-03-17 LAB — MAGNESIUM: Magnesium: 2 mg/dL (ref 1.7–2.4)

## 2020-03-17 LAB — BASIC METABOLIC PANEL
Anion gap: 9 (ref 5–15)
BUN: 13 mg/dL (ref 6–20)
CO2: 21 mmol/L — ABNORMAL LOW (ref 22–32)
Calcium: 9 mg/dL (ref 8.9–10.3)
Chloride: 108 mmol/L (ref 98–111)
Creatinine, Ser: 0.98 mg/dL (ref 0.44–1.00)
GFR, Estimated: >60 mL/min (ref >60)
Glucose, Bld: 143 mg/dL — ABNORMAL HIGH (ref 70–99)
Potassium: 3.2 mmol/L — ABNORMAL LOW (ref 3.5–5.1)
Sodium: 138 mmol/L (ref 135–145)

## 2020-03-17 LAB — CBC
HCT: 36 % (ref 36.0–46.0)
Hemoglobin: 11.2 g/dL — ABNORMAL LOW (ref 12.0–15.0)
MCH: 28.7 pg (ref 26.0–34.0)
MCHC: 31.1 g/dL (ref 30.0–36.0)
MCV: 92.3 fL (ref 80.0–100.0)
Platelets: 290 10*3/uL (ref 150–400)
RBC: 3.9 MIL/uL (ref 3.87–5.11)
RDW: 13.6 % (ref 11.5–15.5)
WBC: 8.4 10*3/uL (ref 4.0–10.5)
nRBC: 0 % (ref 0.0–0.2)

## 2020-03-17 LAB — LIPID PANEL
Cholesterol: 90 mg/dL (ref 0–200)
HDL: 41 mg/dL (ref >40)
LDL Cholesterol: 41 mg/dL (ref 0–99)
Total CHOL/HDL Ratio: 2.2 RATIO
Triglycerides: 42 mg/dL (ref <150)
VLDL: 8 mg/dL (ref 0–40)

## 2020-03-17 LAB — HEMOGLOBIN AND HEMATOCRIT, BLOOD
HCT: 37.1 % (ref 36.0–46.0)
Hemoglobin: 12 g/dL (ref 12.0–15.0)

## 2020-03-17 LAB — HEPARIN LEVEL (UNFRACTIONATED): Heparin Unfractionated: 0.2 IU/mL — ABNORMAL LOW (ref 0.30–0.70)

## 2020-03-17 MED ORDER — ASPIRIN 81 MG PO CHEW
81.0000 mg | CHEWABLE_TABLET | Freq: Every day | ORAL | Status: DC
  Administered 2020-03-17: 81 mg via ORAL
  Filled 2020-03-17: qty 1

## 2020-03-17 MED ORDER — HEPARIN (PORCINE) 25000 UT/250ML-% IV SOLN
1000.0000 [IU]/h | INTRAVENOUS | Status: DC
  Administered 2020-03-17: 900 [IU]/h via INTRAVENOUS
  Filled 2020-03-17 (×2): qty 250

## 2020-03-17 MED ORDER — POTASSIUM CHLORIDE CRYS ER 20 MEQ PO TBCR
40.0000 meq | EXTENDED_RELEASE_TABLET | Freq: Once | ORAL | Status: AC
  Administered 2020-03-17: 40 meq via ORAL
  Filled 2020-03-17: qty 2

## 2020-03-17 MED ORDER — DILTIAZEM HCL ER COATED BEADS 120 MG PO CP24
120.0000 mg | ORAL_CAPSULE | Freq: Every day | ORAL | Status: DC
  Administered 2020-03-17 – 2020-03-18 (×2): 120 mg via ORAL
  Filled 2020-03-17 (×2): qty 1

## 2020-03-17 NOTE — Progress Notes (Signed)
ANTICOAGULATION CONSULT NOTE - Initial Consult  Pharmacy Consult for heparin Indication: atrial fibrillation  Allergies  Allergen Reactions  . Other Itching and Other (See Comments)    Patient experienced intense itching in her arm that was accessed for a past angiogram    Patient Measurements: Height: 5\' 1"  (154.9 cm) Weight: 88.5 kg (195 lb) IBW/kg (Calculated) : 47.8 Heparin Dosing Weight: 68.6 kg  Vital Signs: Temp: 98.4 F (36.9 C) (03/07 0410) Temp Source: Oral (03/07 0410) BP: 96/59 (03/07 0600) Pulse Rate: 65 (03/07 0600)  Labs: Recent Labs    03/16/20 1119 03/16/20 1321 03/17/20 0115  HGB 12.8  --  11.2*  HCT 39.6  --  36.0  PLT 336  --  290  CREATININE 1.07*  --  0.98  TROPONINIHS 20* 21*  --     Estimated Creatinine Clearance: 61.8 mL/min (by C-G formula based on SCr of 0.98 mg/dL).   Medical History: Past Medical History:  Diagnosis Date  . Allergy   . Arthritis    back   . Atypical chest pain 06/30/2016  . Back pain    DUE TO INJURY AT WORK ON05/2013  . Bruises easily   . Chronic lower back pain   . Coronary artery disease   . Depression    was on meds 2 yrs ago   . Headache(784.0)    rarely  . History of bronchitis    last time about 88yrs ago   . Hypertension    takes Benicar daily  . Hypothyroidism 06/30/2016  . Pure hypercholesterolemia 05/31/2019  . Vitamin D deficiency    takes Vitamin D 2 times/wk  . Weakness    and numbness right foot   60YOF admitted with chest pressure, found to have new onset Afib. PMH CAD with DES/PCI placement in 2020 and SAH in setting of punctate strokes 07/2018 as well as HTN, hypothyroidism, and depression. MRI negative for new infarct. Neurology determined history SAH was due to hemorrhagic conversion of ischemic stroke.  Assessment: CHADSVASC 4. Initially started IV heparin due to [facility] SAH. Confirmatory heparin level 0.40 at goal. Hgb/Hct stable. Head CT normal, follow up Head CT planned. Starting oral  anticoagulation per neuro.  Goal of Therapy:  Heparin level 0.3-0.5 units/ml Monitor platelets by anticoagulation protocol: Yes     Plan: Stop heparin infusion.  At time of d/c of heparin, start Eliquis 5 mg PO BID.  Monitor H/H, s/sx bleed.     [facility], PharmD Student 03/17/2020,9:16 AM

## 2020-03-17 NOTE — Progress Notes (Signed)
Pt converted to NSR at approximatley 0336, FMTS on call paged to notify no new orders give

## 2020-03-17 NOTE — Consult Note (Addendum)
Neurology Consultation  Reason for Consult: Atrial fibrillation with history of [facility] July 2021, safety of anticoagulation  Referring Physician: Dr. Idalia Needle  CC: New onset atrial fibrillation with RVR  History is obtained from: Patient, chart review  HPI: [name] is a 60 y.o. female with a medical history of coronary artery disease, hypertension, hypothyroidism, hypercholesterolemia, carotid artery stenosis s/p stenting with DES in July 2020, and a right insula infarct with subarachnoid hemorrhage in July 2021 in the setting of aspirin and plavix use. She presented to the ED 3/6 with chest pain and palpitations and was found to be in atrial fibrillation with rapid ventricular response. Due to concern for history of subarachnoid hemorrhage in the setting of dual antiplatelet therapy, neurology was consulted for recommendations on safety of anticoagulation.   ROS: A 14 point ROS was performed and is negative except as noted in the HPI.  Past Medical History:  Diagnosis Date  . Allergy   . Arthritis    back   . Atypical chest pain 06/30/2016  . Back pain    DUE TO INJURY AT WORK ON05/2013  . Bruises easily   . Chronic lower back pain   . Coronary artery disease   . Depression    was on meds 2 yrs ago   . Headache(784.0)    rarely  . History of bronchitis    last time about 3yrs ago   . Hypertension    takes Benicar daily  . Hypothyroidism 06/30/2016  . Pure hypercholesterolemia 05/31/2019  . Vitamin D deficiency    takes Vitamin D 2 times/wk  . Weakness    and numbness right foot   Past Surgical History:  Procedure Laterality Date  . BACK SURGERY    . CARPAL TUNNEL RELEASE Right   . CESAREAN SECTION  1988  . COLONOSCOPY    . EXPLORATORY LAPAROTOMY  1991   "took out fatty tumor" (11/09/2012)  . IR ANGIO INTRA EXTRACRAN SEL COM CAROTID INNOMINATE BILAT MOD SED  08/10/2019  . IR ANGIO VERTEBRAL SEL VERTEBRAL BILAT MOD SED  08/10/2019  . IR US GUIDE VASC ACCESS RIGHT   08/10/2019  . LEFT HEART CATH AND CORONARY ANGIOGRAPHY N/A 08/06/2016   Procedure: Left Heart Cath and Coronary Angiography;  Surgeon: Lyn Records, MD;  Location: [facility] INVASIVE CV LAB;  Service: Cardiovascular;  Laterality: N/A;  . LEFT HEART CATH AND CORONARY ANGIOGRAPHY N/A 07/12/2018   Procedure: LEFT HEART CATH AND CORONARY ANGIOGRAPHY;  Surgeon: Marykay Lex, MD;  Location: [facility] INVASIVE CV LAB;  Service: Cardiovascular;  Laterality: N/A;  . LUMBAR LAMINECTOMY/DECOMPRESSION MICRODISCECTOMY  11/09/2012   "L3-4" (11/09/2012)  . LUMBAR LAMINECTOMY/DECOMPRESSION MICRODISCECTOMY N/A 11/09/2012   Procedure: L3-L4 DECOMPRESSION AND MICRODISCECTOMY   (1 LEVEL);  Surgeon: Venita Lick, MD;  Location: [facility] OR;  Service: Orthopedics;  Laterality: N/A;   Family History  Problem Relation Age of Onset  . Heart disease Mother   . CAD Mother   . Stroke Mother   . Heart disease Sister   . Heart attack Sister   . Liver disease Brother   . Other Father        unknown medical history  . CAD Brother   . Colon cancer Neg Hx   . Esophageal cancer Neg Hx   . Rectal cancer Neg Hx   . Stomach cancer Neg Hx    Social History:   reports that she has never smoked. She has never used smokeless tobacco. She reports current alcohol use  of about 7.0 standard drinks of alcohol per week. She reports that she does not use drugs. Medications  Current Facility-Administered Medications:  .  acetaminophen (TYLENOL) tablet 650 mg, 650 mg, Oral, Q6H PRN, 650 mg at 03/17/20 1354 **OR** acetaminophen (TYLENOL) suppository 650 mg, 650 mg, Rectal, Q6H PRN, Paige, Victoria J, DO .  atorvastatin (LIPITOR) tablet 80 mg, 80 mg, Oral, q1800, Towanda OctavePatel, Poonam, MD, 80 mg at 03/16/20 1835 .  diltiazem (CARDIZEM CD) 24 hr capsule 120 mg, 120 mg, Oral, Daily, Chilton Siandolph, Tiffany, MD, 120 mg at 03/17/20 1030 .  ezetimibe (ZETIA) tablet 10 mg, 10 mg, Oral, Daily, Allena KatzPatel, Poonam, MD, 10 mg at 03/17/20 0911 .  heparin ADULT infusion 100  units/mL (25000 units/2650mL), 900 Units/hr, Intravenous, Continuous, Ilda BassetDurham, Jennifer D, [place], Last Rate: 9 mL/hr at 03/17/20 1044, 900 Units/hr at 03/17/20 1044 .  sodium chloride 0.9 % bolus 500 mL, 500 mL, Intravenous, Once, Towanda OctavePatel, Poonam, MD  Exam: Current vital signs: BP 105/72 (BP Location: Left Arm)   Pulse 76   Temp 98.1 F (36.7 C) (Oral)   Resp 20   Ht 5\' 1"  (1.549 m)   Wt 88.5 kg   LMP 06/03/2011   SpO2 100%   BMI 36.84 kg/m  Vital signs in last 24 hours: Temp:  [97.9 F (36.6 C)-98.4 F (36.9 C)] 98.1 F (36.7 C) (03/07 1201) Pulse Rate:  [65-100] 76 (03/07 1201) Resp:  [14-20] 20 (03/07 1201) BP: (91-143)/(57-98) 105/72 (03/07 1201) SpO2:  [98 %-100 %] 100 % (03/07 1201)  GENERAL: Awake, alert sitting up in bed with visitors at bedside HEENT: - Normocephalic and atraumatic, without obvious abnormality LUNGS - Normal respiratory effort. Non-labored breathing CV - Regular rate on telemetry, extremities warm and without edema ABDOMEN - Soft, nontender Ext: warm, well perfused  NEURO:  Mental Status: alert and oriented to person, place, time, and situation. Patient is able to provide a clear and coherent history of present illness. Speech is without aphasia or dysarthria.  Cranial Nerves:  II: PERRL 3 mm/brisk. Visual fields full.  III, IV, VI: EOMI without ptosis V: Sensation is intact to light touch and symmetrical to face.  VII: Face is symmetric resting and smiling.   VIII: Hearing intact to voice IX, X: Palate elevation is symmetric. Phonation normal.  XI: Normal sternocleidomastoid and trapezius muscle strength XII: Tongue protrudes midline without fasciculations.   Motor: 5/5 strength is all muscle groups with spontaneous antigravity movement without drift. Tone is normal. Bulk is normal.  Sensation- Intact to light touch bilaterally in all four extremities. Extinction intact.  Coordination: FTN intact bilaterally. No pronator drift. Gait-  deferred  Labs I have reviewed labs in epic and the results pertinent to this consultation are: CBC CBC Latest Ref Rng & Units 03/17/2020 03/17/2020 03/16/2020  WBC 4.0 - 10.5 K/uL - 8.4 8.9  Hemoglobin 12.0 - 15.0 g/dL 96.012.0 11.2(L) 12.8  Hematocrit 36.0 - 46.0 % 37.1 36.0 39.6  Platelets 150 - 400 K/uL - 290 336   CMP     Component Value Date/Time   NA 138 03/17/2020 0115   NA 142 03/11/2020 1655   K 3.2 (L) 03/17/2020 0115   CL 108 03/17/2020 0115   CO2 21 (L) 03/17/2020 0115   GLUCOSE 143 (H) 03/17/2020 0115   BUN 13 03/17/2020 0115   BUN 15 03/11/2020 1655   CREATININE 0.98 03/17/2020 0115   CALCIUM 9.0 03/17/2020 0115   PROT 7.6 03/11/2020 1655   ALBUMIN 4.7 03/11/2020 1655  AST 19 03/11/2020 1655   ALT 21 03/11/2020 1655   ALKPHOS 87 03/11/2020 1655   BILITOT 0.6 03/11/2020 1655   GFRNONAA >60 03/17/2020 0115   GFRAA 70 09/18/2019 1622   Imaging I have reviewed the images obtained: 3/6 MRI examination of the brain: No acute infarction, hemorrhage, or mass.  Echocardiogram: 1. Left ventricular ejection fraction, by estimation, is 60 to 65%. The  left ventricle has normal function. The left ventricle has no regional  wall motion abnormalities. There is mild concentric left ventricular  hypertrophy. Left ventricular diastolic  parameters are consistent with Grade I diastolic dysfunction (impaired relaxation).  2. Right ventricular systolic function is normal. The right ventricular  size is mildly enlarged. There is mildly elevated pulmonary artery  systolic pressure. The estimated right ventricular systolic pressure is 36.4 mmHg.  3. The mitral valve is grossly normal. Mild mitral valve regurgitation.  4. Tricuspid valve regurgitation is mild to moderate.  5. There is flow acceleration noted in the LVOT without evidence of SAM. Likely related to LVOT narrowing during systole in the setting of septal  hypertrophy. Flow acceleration occurs prior to the aortic valve with no  evidence of aortic stenosis. DI 0.7.  6. The aortic valve is tricuspid. There is mild calcification of the  aortic valve. There is mild thickening of the aortic valve. Aortic valve  regurgitation is mild. Mild aortic valve sclerosis is present, with no evidence of aortic valve stenosis.  7. The inferior vena cava is normal in size with greater than 50% respiratory variability, suggesting right atrial pressure of 3 mmHg.   Impression: 60 year old female with history as above who presents in atrial fibrillation with rapid ventricular response with a history of subarachnoid hemorrhage. Currently rate controlled on diltiazem.  - Risk of further ischemic stroke with history of infarct and new onset atrial fibrillation with rapid ventricular response outweighs risk of ICH.  - Subarachnoid hemorrhage likely related to her ischemic stroke back in July 2021, and this is described in the literature is a rare complication of ischemic stroke. SAH noted in July 2021 with stable imaging on MRI brain 03/16/20. - Tolerating heparin drip without complications, hemoglobin and hematocrit stable at this time.   Recommendations: - Continue local no bolus heparin drip until therapeutic, with pharmacy guidance  -Once therapeutic x2 may transition to Eliquis or other oral agent per cardiology preference  -Appreciate pharmacy assistance with timing of stopping heparin drip and starting orally - If patient remains clinically stable no need to repeat head CT prior to transitioning to oral agent for anticoagulation - Standard every 4 hour neuro checks    -STAT head CT and code stroke activation for acute neurological change - From a neurologic perspective, no indication for antiplatelet agents (aspirin and plavix) with heparin gtt  -May consider aspirin 81 mg for cardiac indication (coronary artery disease), defer to cardiology - Neurology will be available on an as-needed basis, no other neurology work-up indicated at this  time  Pt seen by NP/Neuro and later by MD. Note/plan to be edited by MD as needed.  Lanae Boast, AGAC-NP [facility] Pager: [phone]  Attending Neurologist's note:  I personally saw this patient, gathering history, performing a full neurologic examination, reviewing relevant labs, personally reviewing relevant imaging including MRI brain, and formulated the assessment and plan, adding the note above for completeness and clarity to accurately reflect my thoughts  Brooke Dare MD-PhD [facility] [phone] Available 7 PM to 7 AM, outside of these  hours please call Neurologist on call as listed on Amion.

## 2020-03-17 NOTE — Progress Notes (Signed)
ANTICOAGULATION CONSULT NOTE  Pharmacy Consult for heparin Indication: atrial fibrillation  Allergies  Allergen Reactions  . Other Itching and Other (See Comments)    Patient experienced intense itching in her arm that was accessed for a past angiogram    Patient Measurements: Height: 5\' 1"  (154.9 cm) Weight: 88.5 kg (195 lb) IBW/kg (Calculated) : 47.8 Heparin Dosing Weight: 68 kg  Vital Signs: Temp: 98.3 F (36.8 C) (03/07 1600) Temp Source: Oral (03/07 1600) BP: 112/71 (03/07 1600) Pulse Rate: 77 (03/07 1600)  Labs: Recent Labs    03/16/20 1119 03/16/20 1321 03/17/20 0115 03/17/20 0956 03/17/20 1647  HGB 12.8  --  11.2* 12.0  --   HCT 39.6  --  36.0 37.1  --   PLT 336  --  290  --   --   HEPARINUNFRC  --   --   --   --  0.20*  CREATININE 1.07*  --  0.98  --   --   TROPONINIHS 20* 21*  --   --   --     Estimated Creatinine Clearance: 61.8 mL/min (by C-G formula based on SCr of 0.98 mg/dL).   Medical History: Past Medical History:  Diagnosis Date  . Allergy   . Arthritis    back   . Atypical chest pain 06/30/2016  . Back pain    DUE TO INJURY AT WORK ON05/2013  . Bruises easily   . Chronic lower back pain   . Coronary artery disease   . Depression    was on meds 2 yrs ago   . Headache(784.0)    rarely  . History of bronchitis    last time about 95yrs ago   . Hypertension    takes Benicar daily  . Hypothyroidism 06/30/2016  . Pure hypercholesterolemia 05/31/2019  . Vitamin D deficiency    takes Vitamin D 2 times/wk  . Weakness    and numbness right foot    Medications:  Medications Prior to Admission  Medication Sig Dispense Refill Last Dose  . acetaminophen (TYLENOL) 500 MG tablet Take 500-1,000 mg by mouth every 6 (six) hours as needed (pain or headaches).   unk  . atorvastatin (LIPITOR) 80 MG tablet Take 1 tablet (80 mg total) by mouth daily at 6 PM. (Patient taking differently: Take 80 mg by mouth daily.) 90 tablet 2 03/16/2020 at am  . BAYER  LOW DOSE 81 MG EC tablet Take 81 mg by mouth in the morning. Swallow whole.   03/16/2020 at 0830  . carvedilol (COREG) 6.25 MG tablet TAKE 1 TABLET (6.25 MG TOTAL) BY MOUTH 2 (TWO) TIMES DAILY WITH A MEAL. (Patient taking differently: Take 6.25 mg by mouth in the morning and at bedtime.) 180 tablet 2 03/16/2020 at 0830  . cholecalciferol (VITAMIN D3) 25 MCG (1000 UNIT) tablet Take 1,000 Units by mouth daily.    03/16/2020 at am  . ezetimibe (ZETIA) 10 MG tablet TAKE 1 TABLET BY MOUTH EVERY DAY (Patient taking differently: Take 10 mg by mouth daily.) 90 tablet 3 03/16/2020 at 0830  . fluticasone (FLONASE) 50 MCG/ACT nasal spray Place 2 sprays into both nostrils daily as needed for allergies or rhinitis. 16 g 1 unk  . hydrOXYzine (ATARAX/VISTARIL) 25 MG tablet Take 1 tablet (25 mg total) by mouth 3 (three) times daily as needed. (Patient taking differently: Take 25 mg by mouth 3 (three) times daily as needed (AS DIRECTED).) 30 tablet 0 unk  . levocetirizine (XYZAL) 5 MG tablet Take 1  tablet (5 mg total) by mouth daily. 90 tablet 1 03/16/2020 at am  . Magnesium 250 MG TABS TAKE 1 TABLET BY MOUTH DAILY WITH EVENING MEALS (Patient taking differently: Take 250 mg by mouth at bedtime.) 90 tablet 2 03/15/2020 at pm  . meloxicam (MOBIC) 15 MG tablet Take 1 tablet (15 mg total) by mouth daily as needed for pain. 30 tablet 6 unki  . Olmesartan-amLODIPine-HCTZ 20-5-12.5 MG TABS TAKE 1 TABLET BY MOUTH EVERY DAY (Patient taking differently: Take 1 tablet by mouth in the morning.) 90 tablet 1 03/16/2020 at am  . tiZANidine (ZANAFLEX) 4 MG tablet Take 1 tablet (4 mg total) by mouth every 8 (eight) hours as needed for muscle spasms. 30 tablet 0 unk  . topiramate (TOPAMAX) 100 MG tablet Take 1 tablet (100 mg total) by mouth at bedtime. 90 tablet 4 03/15/2020 at pm  . HYDROcodone-acetaminophen (NORCO/VICODIN) 5-325 MG tablet Take 1 tablet by mouth every 6 (six) hours as needed for moderate pain. (Patient not taking: No sig reported) 30  tablet 0 Not Taking at Unknown time  . mometasone (ELOCON) 0.1 % cream Apply to affected area daily (Patient not taking: No sig reported) 45 g 1 Not Taking at Unknown time    Assessment: 40 yof who presented to the ED with chest pressure found to have new onset Afib. CHA2DS2-VASc score is 5 (female, HTN, stroke, vascular disease). She is on no AC PTA. Of note, patient has history of SAH in the setting of punctate strokes in July 2021. Neuro suspects hemorrhagic conversion of an ischemic stroke at that time and has recommended low goal IV heparin for now. Plan is to transition to oral AC if CT head neg after 24h of IV heparin. Pharmacy consulted to manage IV heparin drip. No bleeding noted, CBC is normal.  Initial heparin level came back subtherapeutic at 0.20, on 900 units/hr. No s/sx of bleeding or infusion issues per nursing.    Goal of Therapy:  Heparin level 0.3-0.5 units/ml Monitor platelets by anticoagulation protocol: Yes   Plan:  Increase heparin infusion to 1000 units/hr with no bolus 6 hr heparin level Daily heparin level and CBC Monitor for s/sx of bleeding F/U ability to transition to oral agent  Thank you for involving pharmacy in this patient's care.  Sherron Monday, PharmD, BCCCP Clinical Pharmacist  Phone: [phone] 03/17/2020 6:23 PM  Please check AMION for all [facility] Pharmacy phone numbers After 10:00 PM, call Main Pharmacy [phone]

## 2020-03-17 NOTE — Hospital Course (Addendum)
  [name] is a 60 y.o. female presenting with increased chest pressure and palpitations, found to have new onset A. fib with RVR. PMH is significant for spontaneous arachnoid hemorrhage, CAD, HTN, hypothyroidism, depression.   New onset Afib with RVR  Patient presented with 1 day of chest pressure, palpitations and increased shortness of breath. On arrival, HR 153, BP 140/100. EKG showing A. fib with RVR. Troponin 20>21. CXR without cardiopulmonary abnormality. Cardizem drip started in ED. Patient received 1500L NS bolus. On physical exam HR irregularly irregular. New onset a fib likely due to coronary artery disease. Patient was consulted by cardiology and was ultimately transitioned off of Cardizem drip and was put on oral diltiazem which she was continued on at discharge. Her blood pressure medication were held due to her low blood pressure, but was recommended to restart at discharge since her Bps had normalized.     Hx of Right MCA stroke  Spontaneous arachnoid hemorrhage July 27th 2021 presented with leg heaviness and facial numbness which head CT revealed a right-sided subarachnoid hemorrhage, in which she was treated with aspirin and Plavix. MRI wo contrast was obtained which was negative for acute infarction, hemorrhage, or mass. Her neurologic status was monitored every 4 hours. Patient was ultimately transitioned from IV heparin to Eliquis once stable and upon confirmation that her head CT was normal. She remained hemodynamically stable during hospitalization.   All other medical conditions chronic and stable   Follow up  - outpatient sleep study to resume her CPAP. - track  blood pressures at home and resume olmesartan/amlodipine/HCTZ once blood pressure is greater than 140/90. Restart BP - stop aspirin, just eliquis  Discuss with PCP about restarting coreg vs stopping dilt

## 2020-03-17 NOTE — Care Plan (Signed)
Curbsided about safety of AC in this patient presenting with new onset Afib with history of SAH in the setting of punctate strokes in July 2021. Suspect hemorrhagic conversion of an ischemic stroke at that time. MRI repeated without any new infarct.   Recommendations - Low goal no bolus heparin drip  - Repeat HCT 24 hours AFTER therapeutic x 2 checks - If HCT is stable and patient remains clinically stable, may transition to oral AC - STAT HCT and code stroke activation for any acute neurological changes  - Please repage neurology with any additional questions or if new neurological questions arise  Brooke Dare MD-PhD [facility] [phone] Available 7 AM to 7 PM, outside these hours please contact Neurologist on call listed on AMION

## 2020-03-17 NOTE — Progress Notes (Signed)
Family Medicine Teaching Service Daily Progress Note Intern Pager: [phone]  Patient name: [name] Medical record number: [id number] Date of birth: [birth year] Age: 60 y.o. Gender: female  Primary Care Provider: Dorothyann Peng, MD Consultants: Cardiology, Neurology Code Status: Full  Pt Overview and Major Events to Date:  3/6 Admitted  3/7 Auto converted to NSR   Assessment and Plan: [name] is a 60 y.o. female presenting with increased chest pressure and palpitations, found to have new onset A. fib with RVR. PMH is significant for spontaneous arachnoid hemorrhage, CAD, HTN, hypothyroidism, depression.  Paroxysmal Afib with RVR  Improvement in chest pressure. Converted out of a fib 3/7 at [phone] -Cardiology consulted, appreciate continued care and recommendations              -Maintain K >4, Mg >2. hold carvedilol due to soft BP, antihypertensive adjustments prior to discharge continue statin -Neuro initially curbsided and now formally consulted, appreciate continued care recommendations:              -Low goal no bolus heparin drip, repeat head CT 24 hours after therapeutic x2 checks.  If patient stable can transition to po AC - q 4h neuro checks  -Up with assistance -PO Dilt 120mg    - CT ordered   Hx of Right MCA stroke  Spontaneous arachnoid hemorrhage Current MRI without infarct, hemorrhage, mass.  Per neuro, follow-up with two head CTs 24 hours after starting heparin and prior to starting oral anticoagulation. -Frequent neuro checks - hold aspirin and plavix  -f/u head CT   Chest pain  Coronary artery disease s/p PCI   Previous echo 08/08/19 with ejection fraction 60 to 65%. Current echo unchanged with systolic function preserved -Cardiology consulted, appreciate continued care and recommendations  Hypertension BP on admission 140/100.  Bps over the past 24h have ranged from 105-141/67-84 Home medications include: Olmesartan-Amlodipine20-5-12.5mg ,  carvdelol 6.25mg  BID -Hold BP med given soft blood pressures - Per cards, atient to resume olmesartan/amlodipine/HCTZ once blood pressure is greater than 140/90. -Restart as able  Hypokalemia  K+ 3.7. Because of her a fib, prefer to keep K+ 4.0 - repleting with Kcl [birth year]-1978 - am BMP - continue to replete as needed    Hypercholesterolemia LDL 68 on 08/09/19 Home medication: Atorvastatin and Zetia -Continue atorvastatin and Zetia -F/u repeat lipid panel  Migraines, chronic  Home medication: Topamax 100mg  bedtime.  -Con't Topamax   FEN/GI: Heart healthy diet  Prophylaxis: TBD by neuro. SCDs for now  Disposition: Cardiac telemetry  Subjective:  No acute events overnight. Denies chest pressure or current headache. Patient slightly tearful on exam but states she is ok and denies any complaints.   Objective: Temp:  [98 F (36.7 C)-98.4 F (36.9 C)] 98 F (36.7 C) (03/07 2000) Pulse Rate:  [65-100] 85 (03/07 2000) Resp:  [10-22] 22 (03/07 2000) BP: (92-120)/(57-75) 105/69 (03/07 2000) SpO2:  [97 %-100 %] 100 % (03/07 2000) Physical Exam: General: well appearing, pleasant, NAD Cardiovascular: RRR no murmurs  Respiratory: CTAB Normal WOB  Abdomen: soft, non distended Extremities: warm, dry. No edema. Distal pulses 2+ Neuro: CN 2-12 in tact. 5/5 strength in upper and lower extremities   Laboratory: Recent Labs  Lab 03/16/20 1119 03/17/20 0115 03/17/20 0956  WBC 8.9 8.4  --   HGB 12.8 11.2* 12.0  HCT 39.6 36.0 37.1  PLT 336 290  --    Recent Labs  Lab 03/11/20 1655 03/16/20 1119 03/17/20 0115  NA 142 139 138  K  4.2 4.4 3.2*  CL 106 107 108  CO2 21 22 21*  BUN 15 13 13   CREATININE 0.97 1.07* 0.98  CALCIUM 9.9 9.7 9.0  PROT 7.6  --   --   BILITOT 0.6  --   --   ALKPHOS 87  --   --   ALT 21  --   --   AST 19  --   --   GLUCOSE 101* 146* 143*    Imaging/Diagnostic Tests: None new  , DO 03/17/2020, 9:54 PM PGY-1, [facility] Family  Medicine FPTS Intern pager: [phone], text pages welcome

## 2020-03-17 NOTE — Progress Notes (Signed)
ANTICOAGULATION CONSULT NOTE - Initial Consult  Pharmacy Consult for heparin Indication: atrial fibrillation  Allergies  Allergen Reactions  . Other Itching and Other (See Comments)    Patient experienced intense itching in her arm that was accessed for a past angiogram    Patient Measurements: Height: 5\' 1"  (154.9 cm) Weight: 88.5 kg (195 lb) IBW/kg (Calculated) : 47.8 Heparin Dosing Weight: 68 kg  Vital Signs: Temp: 98.4 F (36.9 C) (03/07 0410) Temp Source: Oral (03/07 0410) BP: 96/59 (03/07 0600) Pulse Rate: 65 (03/07 0600)  Labs: Recent Labs    03/16/20 1119 03/16/20 1321 03/17/20 0115  HGB 12.8  --  11.2*  HCT 39.6  --  36.0  PLT 336  --  290  CREATININE 1.07*  --  0.98  TROPONINIHS 20* 21*  --     Estimated Creatinine Clearance: 61.8 mL/min (by C-G formula based on SCr of 0.98 mg/dL).   Medical History: Past Medical History:  Diagnosis Date  . Allergy   . Arthritis    back   . Atypical chest pain 06/30/2016  . Back pain    DUE TO INJURY AT WORK ON05/2013  . Bruises easily   . Chronic lower back pain   . Coronary artery disease   . Depression    was on meds 2 yrs ago   . Headache(784.0)    rarely  . History of bronchitis    last time about 1yrs ago   . Hypertension    takes Benicar daily  . Hypothyroidism 06/30/2016  . Pure hypercholesterolemia 05/31/2019  . Vitamin D deficiency    takes Vitamin D 2 times/wk  . Weakness    and numbness right foot    Medications:  Medications Prior to Admission  Medication Sig Dispense Refill Last Dose  . acetaminophen (TYLENOL) 500 MG tablet Take 500-1,000 mg by mouth every 6 (six) hours as needed (pain or headaches).   unk  . atorvastatin (LIPITOR) 80 MG tablet Take 1 tablet (80 mg total) by mouth daily at 6 PM. (Patient taking differently: Take 80 mg by mouth daily.) 90 tablet 2 03/16/2020 at am  . BAYER LOW DOSE 81 MG EC tablet Take 81 mg by mouth in the morning. Swallow whole.   03/16/2020 at 0830  .  carvedilol (COREG) 6.25 MG tablet TAKE 1 TABLET (6.25 MG TOTAL) BY MOUTH 2 (TWO) TIMES DAILY WITH A MEAL. (Patient taking differently: Take 6.25 mg by mouth in the morning and at bedtime.) 180 tablet 2 03/16/2020 at 0830  . cholecalciferol (VITAMIN D3) 25 MCG (1000 UNIT) tablet Take 1,000 Units by mouth daily.    03/16/2020 at am  . ezetimibe (ZETIA) 10 MG tablet TAKE 1 TABLET BY MOUTH EVERY DAY (Patient taking differently: Take 10 mg by mouth daily.) 90 tablet 3 03/16/2020 at 0830  . fluticasone (FLONASE) 50 MCG/ACT nasal spray Place 2 sprays into both nostrils daily as needed for allergies or rhinitis. 16 g 1 unk  . hydrOXYzine (ATARAX/VISTARIL) 25 MG tablet Take 1 tablet (25 mg total) by mouth 3 (three) times daily as needed. (Patient taking differently: Take 25 mg by mouth 3 (three) times daily as needed (AS DIRECTED).) 30 tablet 0 unk  . levocetirizine (XYZAL) 5 MG tablet Take 1 tablet (5 mg total) by mouth daily. 90 tablet 1 03/16/2020 at am  . Magnesium 250 MG TABS TAKE 1 TABLET BY MOUTH DAILY WITH EVENING MEALS (Patient taking differently: Take 250 mg by mouth at bedtime.) 90 tablet 2 03/15/2020  at pm  . meloxicam (MOBIC) 15 MG tablet Take 1 tablet (15 mg total) by mouth daily as needed for pain. 30 tablet 6 unki  . Olmesartan-amLODIPine-HCTZ 20-5-12.5 MG TABS TAKE 1 TABLET BY MOUTH EVERY DAY (Patient taking differently: Take 1 tablet by mouth in the morning.) 90 tablet 1 03/16/2020 at am  . tiZANidine (ZANAFLEX) 4 MG tablet Take 1 tablet (4 mg total) by mouth every 8 (eight) hours as needed for muscle spasms. 30 tablet 0 unk  . topiramate (TOPAMAX) 100 MG tablet Take 1 tablet (100 mg total) by mouth at bedtime. 90 tablet 4 03/15/2020 at pm  . HYDROcodone-acetaminophen (NORCO/VICODIN) 5-325 MG tablet Take 1 tablet by mouth every 6 (six) hours as needed for moderate pain. (Patient not taking: No sig reported) 30 tablet 0 Not Taking at Unknown time  . mometasone (ELOCON) 0.1 % cream Apply to affected area daily  (Patient not taking: No sig reported) 45 g 1 Not Taking at Unknown time    Assessment: 81 yof who presented to the ED with chest pressure found to have new onset Afib. CHA2DS2-VASc score is 5 (female, HTN, stroke, vascular disease). She is on no AC PTA. Of note, patient has history of SAH in the setting of punctate strokes in July 2021. Neuro suspects hemorrhagic conversion of an ischemic stroke at that time and has recommended low goal IV heparin for now. Plan is to transition to oral AC if CT head neg after 24h of IV heparin. Pharmacy consulted to manage IV heparin drip. No bleeding noted, CBC is normal.   Goal of Therapy:  Heparin level 0.3-0.5 units/ml Monitor platelets by anticoagulation protocol: Yes   Plan:  Begin IV heparin at 900 units/hr with no bolus 6 hr heparin level Daily heparin level and CBC Monitor for s/sx of bleeding F/U ability to transition to oral agent  Thank you for involving pharmacy in this patient's care.  Loura Back, PharmD, BCPS Clinical Pharmacist Clinical phone for 03/17/2020 until 3p is [phone] 03/17/2020 9:14 AM  **Pharmacist phone directory can be found on [email or web address] listed under [facility]**

## 2020-03-17 NOTE — Progress Notes (Signed)
FPTS Interim Progress Note  S: Patient eating dinner, resting comfortably. No notable changes throughout day.  O: BP 112/71 (BP Location: Left Arm)   Pulse 77   Temp 98.3 F (36.8 C) (Oral)   Resp 20   Ht 5\' 1"  (1.549 m)   Wt 88.5 kg   LMP 06/03/2011   SpO2 97%   BMI 36.84 kg/m   Gen: age-appropriate, AAW, NAD, resting comfortably Neuro: Cranial Nerves: II: PERRL.  III,IV, VI: EOMI without ptosis or diplopia.  V: Facial sensation is symmetric to temperature VII: Facial movement is symmetric.  VIII: hearing is intact to voice X: Palate elevates symmetrically XI: Shoulder shrug is symmetric. XII: tongue is midline without atrophy or fasciculations.  Motor: Tone is normal. Bulk is normal. 5/5 strength was present in all four extremities.  Sensory: Sensation is symmetric to light touch and temperature in the arms and legs. Deep Tendon Reflexes: 2+ and symmetric in the biceps and patellae.   A/P: 59 yo woman here for tx of A. Fib and h/o SAH - No focal neurodeficits, no changes - Continue heparin infusion - CT head tomorrow - Low threshold to obtain stat imaging call code stroke if any neuro changes found - Will have night residents examine  67, MD 03/17/2020, 6:31 PM PGY-2, [facility] Service pager [phone]

## 2020-03-17 NOTE — Progress Notes (Addendum)
Progress Note  Patient Name: [name] Date of Encounter: 03/17/2020  [facility] Cardiologist: Chilton Si, MD   Subjective   Feeling well.  Denies any chest pain or shortness of breath.  Notes that she has been drinking cappuccinos lately.  She drinks at least 2 in the mornings.  This is out of the norm for her.  She also has sleep apnea and has not been using her machine.  Inpatient Medications    Scheduled Meds: . aspirin  81 mg Oral Daily  . atorvastatin  80 mg Oral q1800  . ezetimibe  10 mg Oral Daily   Continuous Infusions: . diltiazem (CARDIZEM) infusion 5 mg/hr (03/17/20 0600)  . sodium chloride     PRN Meds: acetaminophen **OR** acetaminophen   Vital Signs    Vitals:   03/17/20 0205 03/17/20 0300 03/17/20 0410 03/17/20 0600  BP: 95/73 92/61 102/70 (!) 96/59  Pulse: 95 89 69 65  Resp: 15 15 15 16   Temp:   98.4 F (36.9 C)   TempSrc:   Oral   SpO2: 99% 98% 99% 99%  Weight:      Height:        Intake/Output Summary (Last 24 hours) at 03/17/2020 0846 Last data filed at 03/17/2020 0600 Gross per 24 hour  Intake 420 ml  Output 0 ml  Net 420 ml   Last 3 Weights 03/16/2020 03/11/2020 10/18/2019  Weight (lbs) 195 lb 198 lb 6.4 oz 195 lb 8 oz  Weight (kg) 88.451 kg 89.994 kg 88.678 kg      Telemetry    Atrial fibrillation transition to sinus rhythm.  Rates were well-controlled in atrial fibrillation- Personally Reviewed  ECG    n/a - Personally Reviewed  Physical Exam   VS:  BP (!) 96/59   Pulse 65   Temp 98.4 F (36.9 C) (Oral)   Resp 16   Ht 5\' 1"  (1.549 m)   Wt 88.5 kg   LMP 06/03/2011   SpO2 99%   BMI 36.84 kg/m  , BMI Body mass index is 36.84 kg/m. GENERAL:  Well appearing HEENT: Pupils equal round and reactive, fundi not visualized, oral mucosa unremarkable NECK:  No jugular venous distention, waveform within normal limits, carotid upstroke brisk and symmetric, no bruits, no thyromegaly LUNGS:  Clear to auscultation  bilaterally HEART:  RRR.  PMI not displaced or sustained,S1 and S2 within normal limits, no S3, no S4, no clicks, no rubs, no murmurs ABD:  Flat, positive bowel sounds normal in frequency in pitch, no bruits, no rebound, no guarding, no midline pulsatile mass, no hepatomegaly, no splenomegaly EXT:  2 plus pulses throughout, no edema, no cyanosis no clubbing SKIN:  No rashes no nodules NEURO:  Cranial nerves II through XII grossly intact, motor grossly intact throughout PSYCH:  Cognitively intact, oriented to person place and time   Labs    High Sensitivity Troponin:   Recent Labs  Lab 03/16/20 1119 03/16/20 1321  TROPONINIHS 20* 21*      Chemistry Recent Labs  Lab 03/11/20 1655 03/16/20 1119 03/17/20 0115  NA 142 139 138  K 4.2 4.4 3.2*  CL 106 107 108  CO2 21 22 21*  GLUCOSE 101* 146* 143*  BUN 15 13 13   CREATININE 0.97 1.07* 0.98  CALCIUM 9.9 9.7 9.0  PROT 7.6  --   --   ALBUMIN 4.7  --   --   AST 19  --   --   ALT 21  --   --  ALKPHOS 87  --   --   BILITOT 0.6  --   --   GFRNONAA  --  59* >60  ANIONGAP  --  10 9     Hematology Recent Labs  Lab 03/16/20 1119 03/17/20 0115  WBC 8.9 8.4  RBC 4.30 3.90  HGB 12.8 11.2*  HCT 39.6 36.0  MCV 92.1 92.3  MCH 29.8 28.7  MCHC 32.3 31.1  RDW 13.7 13.6  PLT 336 290    BNPNo results for input(s): BNP, PROBNP in the last 168 hours.   DDimer No results for input(s): DDIMER in the last 168 hours.   Radiology    MR BRAIN WO CONTRAST  Result Date: 03/16/2020 CLINICAL DATA:  Chest pain, blurry vision, and right headache; history of subarachnoid hemorrhage EXAM: MRI HEAD WITHOUT CONTRAST TECHNIQUE: Multiplanar, multiecho pulse sequences of the brain and surrounding structures were obtained without intravenous contrast. COMPARISON:  August 2021 FINDINGS: Brain: There is no acute infarction or intracranial hemorrhage. There is no intracranial mass, mass effect, or edema. There is no hydrocephalus or extra-axial fluid  collection. Ventricles and sulci are normal in size and configuration. Right frontoparietal sulcal hemosiderin deposition reflecting prior subarachnoid hemorrhage. Vascular: Loss of the expected flow void of majority of the right internal carotid reflecting known occlusion with intracranial reconstitution. Otherwise major vessel flow voids at the skull base are preserved. Skull and upper cervical spine: Normal marrow signal is preserved. Sinuses/Orbits: Minor mucosal thickening.  Orbits are unremarkable. Other: Sella is unremarkable.  Mastoid air cells are clear. IMPRESSION: No acute infarction, hemorrhage, or mass. Electronically Signed   By: Guadlupe Spanish M.D.   On: 03/16/2020 17:35   DG Chest Portable 1 View  Result Date: 03/16/2020 CLINICAL DATA:  Chest pain EXAM: PORTABLE CHEST 1 VIEW COMPARISON:  March 10, 2019 FINDINGS: The cardiomediastinal silhouette is unchanged and mildly enlarged in contour. No pleural effusion. No pneumothorax. No acute pleuroparenchymal abnormality. Visualized abdomen is unremarkable. Multilevel degenerative changes of the thoracic spine. IMPRESSION: No acute cardiopulmonary abnormality. Electronically Signed   By: Meda Klinefelter MD   On: 03/16/2020 11:46    Cardiac Studies   Echo 07/13/16: Study Conclusions  - Left ventricle: The cavity size was normal. Wall thickness was normal. Systolic function was vigorous. The estimated ejection fraction was in the range of 65% to 70%. Wall motion was normal; there were no regional wall motion abnormalities. Features are consistent with a pseudonormal left ventricular filling pattern, with concomitant abnormal relaxation and increased filling pressure (grade 2 diastolic dysfunction). - Mitral valve: There was mild regurgitation. - Pulmonary arteries: Systolic pressure was moderately increased. PA peak pressure: 48 mm Hg (S).  ETT 07/22/16: Blood pressure demonstrated a normal response to  exercise.  Clinically negative Electrically positive for ischemia with significant ST changes beginning in Stage II.  Excellent exercise tolerance  Echo 07/13/16: Study Conclusions  - Left ventricle: The cavity size was normal. Wall thickness was normal. Systolic function was vigorous. The estimated ejection fraction was in the range of 65% to 70%. Wall motion was normal; there were no regional wall motion abnormalities. Features are consistent with a pseudonormal left ventricular filling pattern, with concomitant abnormal relaxation and increased filling pressure (grade 2 diastolic dysfunction). - Mitral valve: There was mild regurgitation. - Pulmonary arteries: Systolic pressure was moderately increased. PA peak pressure: 48 mm Hg (S).  Patient Profile     60 y.o. female with CAD status post PCI, carotid stenosis (right 80 to 90%, left 30%),  hypertension, and right subarachnoid hemorrhage admitted with atrial fibrillation.  Assessment & Plan    # New onset atrial fibrillation: Presented with chest pain and found to be in atrial fibrillation with RVR.  Rate controlled on IV diltiazem.  She is now back in sinus rhythm.  We will transition diltiazem to 120 mg daily.  Her home carvedilol has been held.  Maintain K>4, Mg >2.  TSH wnl.  Switch heparin to Eliquis if her next H/H is stable.  Echo personally reviewed.  Her systolic function is preserved.  Discussed limiting caffeine intake.  She will also need an outpatient sleep study to resume her CPAP.  # Elevated troponin: Minimally elevated and flat.  She did have chest discomfort when in RVR but her discomfort has now resolved.  Likely demand ischemia.  Prior PCI to left circumflex in 2000 with otherwise mild, non-obstructive CAD.  # Subarachnoid hemorrhage: 07/2019.  Occurred in the setting of being on aspirin and Plavix.  Plavix was discontinued and she was only on aspirin at the time of this admission.  Currently on IV  heparin with plans to transition to oral anticoagulation is stable.  Follow h/h.  # Carotid stenosis: 80 to 90% right ICA stenosis.  Follows with vascular.  Given her history of subarachnoid hemorrhage in the setting of being on aspirin and clopidogrel, will stop aspirin and just use Eliquis as above.  # Hypertension: Blood pressure has been low.  Home carvedilol, olmesartan, amlodipine, and HCTZ are on hold.  Suspect her blood pressure will start to rise at home.  For now, will just switch to diltiazem 120 mg daily.  She will need to track her blood pressures at home and resume olmesartan/amlodipine/HCTZ once her blood pressure is greater than 140/90.  # Hyperlipidemia:  Continue atorvastatin and Zetia.   [facility] will sign off.   Medication Recommendations: Stop aspirin and start Eliquis 5 mg twice daily if H/H is stable Other recommendations (labs, testing, etc): Outpatient sleep study Follow up as an outpatient: We will arrange  For questions or updates, please contact [facility] Please consult [email or web address] for contact info under        Signed, Chilton Si, MD  03/17/2020, 8:46 AM

## 2020-03-17 NOTE — Progress Notes (Signed)
Night time neuro check on patient in the setting of prior [facility] currently on anticoagulation. Patient and nurse up walking in the hallway and talking together. Both report that neurology came to check on the patient 1 hour prior and she had no abnormalities on her exam at that time. Currently reassured as patient is up ambulating without assistance, reports no symptoms or changes in strength or confusion, and is able to converse well. We will check in again on patient in a few hours.  Rebekah Boruff, DO

## 2020-03-17 NOTE — Progress Notes (Signed)
  Echocardiogram 2D Echocardiogram has been performed.  [name] 03/17/2020, 8:51 AM

## 2020-03-17 NOTE — Progress Notes (Addendum)
Family Medicine Teaching Service Daily Progress Note Intern Pager: [phone]  Patient name: [name] Medical record number: [id number] Date of birth: 03-16-1960 Age: 60 y.o. Gender: female  Primary Care Provider: Dorothyann Peng, MD Consultants: Cardiology  Code Status: Full   Pt Overview and Major Events to Date:  3/6 Admitted  3/7 Auto converted to NSR   Assessment and Plan: [name] is a 60 y.o. female presenting with increased chest pressure and palpitations, found to have new onset A. fib with RVR. PMH is significant for spontaneous arachnoid hemorrhage, CAD, HTN, hypothyroidism, depression.  New onset Afib with RVR  Improvement in chest pressure. Converted out of a fib this morning at [phone] -Cardiology consulted, appreciate continued care and recommendations   -Maintain K >4, Mg >2. hold carvedilol due to soft BP, antihypertensive adjustments prior to discharge continue statin -Neuro initially curbsided and now formally consulted, appreciate continued care recommendations:   -Low goal no bolus heparin drip, repeat head CT 24 hours after therapeutic x2 checks.  If patient stable can transition to po AC -Up with assistance -PO Dilt 120mg    -Follow-up echo  Hx of Right MCA stroke  Spontaneous arachnoid hemorrhage Current MRI without infarct, hemorrhage, mass.  Per neuro, follow-up with two head CTs 24 hours after starting heparin and prior to starting oral anticoagulation. -Frequent neuro checks - hold aspirin and plavix  - Order head CT this evening   Chest pain  Coronary artery disease s/p PCI   Previous echo 08/08/19 with ejection fraction 60 to 65%.  -Cardiology consulted, appreciate continued care and recommendations -F/u echo pending   Hypertension BP on admission 140/100.  Current BP stable at 104/84 Home medications include: Olmesartan-Amlodipine20-5-12.5mg , carvdelol 6.25mg  BID -Hold BP med given soft blood pressures - Per cards, atient to resume  olmesartan/amlodipine/HCTZ once blood pressure is greater than 140/90. -Restart as able  Hypokalemia  K+ 3.2 - repleted with Kcl 03-04-1978 - am BMP - continue to replete as needed    Hypercholesterolemia LDL 68 on 08/09/19 Home medication: Atorvastatin and Zetia -Continue atorvastatin and Zetia -F/u repeat lipid panel  Headache  Patient with a history of chronic headaches.  Experienced headache this morning on her right frontotemporal area that has since no no acute events overnight resolved. Home medication: Topamax 100mg  bedtime -Hold Topamax, can restart if she has headaches  FEN/GI: Heart healthy diet  Prophylaxis: TBD by neuro. SCDs for now  Disposition: Cardiac telemetry  Subjective:  No acute events overnight.  Patient endorses feeling well, denies any current chest pain or palpitations.  Denies any complaints  Objective: Temp:  [97.9 F (36.6 C)-98.6 F (37 C)] 98.4 F (36.9 C) (03/07 0410) Pulse Rate:  [65-153] 65 (03/07 0600) Resp:  [14-22] 16 (03/07 0600) BP: (91-143)/(57-107) 96/59 (03/07 0600) SpO2:  [97 %-100 %] 99 % (03/07 0600) Weight:  [88.5 kg] 88.5 kg (03/06 1118) Physical Exam: General: well appearing, NAD Cardiovascular: RRR no murmurs Respiratory: CTAB normal WOB Abdomen: soft, non distended, non tender Extremities: warm, dry. No edema. Distal pulses 2+  Laboratory: Recent Labs  Lab 03/16/20 1119 03/17/20 0115  WBC 8.9 8.4  HGB 12.8 11.2*  HCT 39.6 36.0  PLT 336 290   Recent Labs  Lab 03/11/20 1655 03/16/20 1119 03/17/20 0115  NA 142 139 138  K 4.2 4.4 3.2*  CL 106 107 108  CO2 21 22 21*  BUN 15 13 13   CREATININE 0.97 1.07* 0.98  CALCIUM 9.9 9.7 9.0  PROT 7.6  --   --  BILITOT 0.6  --   --   ALKPHOS 87  --   --   ALT 21  --   --   AST 19  --   --   GLUCOSE 101* 146* 143*     Imaging/Diagnostic Tests: MR BRAIN WO CONTRAST  Result Date: 03/16/2020 CLINICAL DATA:  Chest pain, blurry vision, and right headache; history of  subarachnoid hemorrhage EXAM: MRI HEAD WITHOUT CONTRAST TECHNIQUE: Multiplanar, multiecho pulse sequences of the brain and surrounding structures were obtained without intravenous contrast. COMPARISON:  August 2021 FINDINGS: Brain: There is no acute infarction or intracranial hemorrhage. There is no intracranial mass, mass effect, or edema. There is no hydrocephalus or extra-axial fluid collection. Ventricles and sulci are normal in size and configuration. Right frontoparietal sulcal hemosiderin deposition reflecting prior subarachnoid hemorrhage. Vascular: Loss of the expected flow void of majority of the right internal carotid reflecting known occlusion with intracranial reconstitution. Otherwise major vessel flow voids at the skull base are preserved. Skull and upper cervical spine: Normal marrow signal is preserved. Sinuses/Orbits: Minor mucosal thickening.  Orbits are unremarkable. Other: Sella is unremarkable.  Mastoid air cells are clear. IMPRESSION: No acute infarction, hemorrhage, or mass. Electronically Signed   By: Guadlupe Spanish M.D.   On: 03/16/2020 17:35    Cora Collum, DO 03/17/2020, 7:13 AM PGY-1, [facility] FPTS Intern pager: [phone], text pages welcome

## 2020-03-18 ENCOUNTER — Inpatient Hospital Stay (HOSPITAL_COMMUNITY): Payer: 59

## 2020-03-18 LAB — CBC
HCT: 35.2 % — ABNORMAL LOW (ref 36.0–46.0)
Hemoglobin: 11 g/dL — ABNORMAL LOW (ref 12.0–15.0)
MCH: 29 pg (ref 26.0–34.0)
MCHC: 31.3 g/dL (ref 30.0–36.0)
MCV: 92.9 fL (ref 80.0–100.0)
Platelets: 247 10*3/uL (ref 150–400)
RBC: 3.79 MIL/uL — ABNORMAL LOW (ref 3.87–5.11)
RDW: 13.5 % (ref 11.5–15.5)
WBC: 8.5 10*3/uL (ref 4.0–10.5)
nRBC: 0 % (ref 0.0–0.2)

## 2020-03-18 LAB — BASIC METABOLIC PANEL
Anion gap: 8 (ref 5–15)
BUN: 14 mg/dL (ref 6–20)
CO2: 22 mmol/L (ref 22–32)
Calcium: 9.2 mg/dL (ref 8.9–10.3)
Chloride: 109 mmol/L (ref 98–111)
Creatinine, Ser: 0.87 mg/dL (ref 0.44–1.00)
GFR, Estimated: >60 mL/min (ref >60)
Glucose, Bld: 112 mg/dL — ABNORMAL HIGH (ref 70–99)
Potassium: 3.7 mmol/L (ref 3.5–5.1)
Sodium: 139 mmol/L (ref 135–145)

## 2020-03-18 LAB — HEPARIN LEVEL (UNFRACTIONATED)
Heparin Unfractionated: 0.34 IU/mL (ref 0.30–0.70)
Heparin Unfractionated: 0.4 IU/mL (ref 0.30–0.70)

## 2020-03-18 LAB — MAGNESIUM: Magnesium: 2 mg/dL (ref 1.7–2.4)

## 2020-03-18 IMAGING — CT CT HEAD W/O CM
4 series · 17 of 47 positions shown, 19 images · non-contrast
Comparison: MRI [DATE]

CLINICAL DATA: Atrial fibrillation.  Stroke.

EXAM:
CT HEAD WITHOUT CONTRAST
TECHNIQUE: Contiguous axial images were obtained from the base of the skull
through the vertex without intravenous contrast.

[Series 3: head without · axial · non-contrast · 0.45mm/px · z∈[-70,+50]mm · 7 of 32 slices shown, 9 images]
[im 4/32  brain]
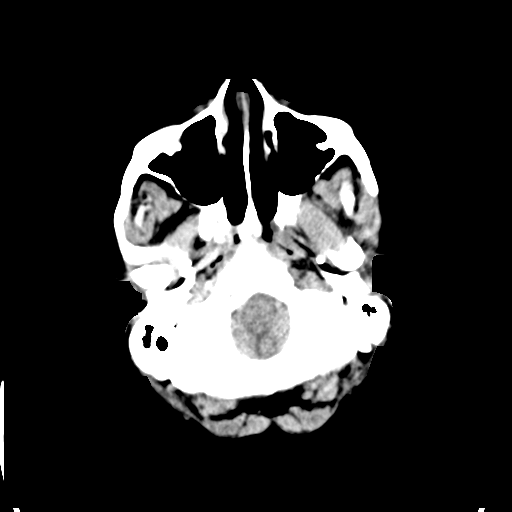
[im 4/32  bone]
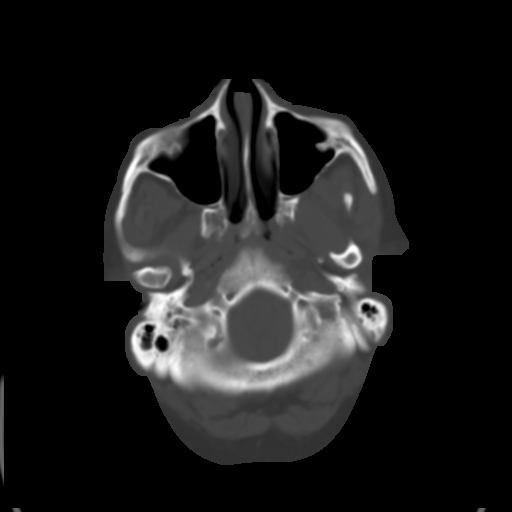
[im 8/32  brain]
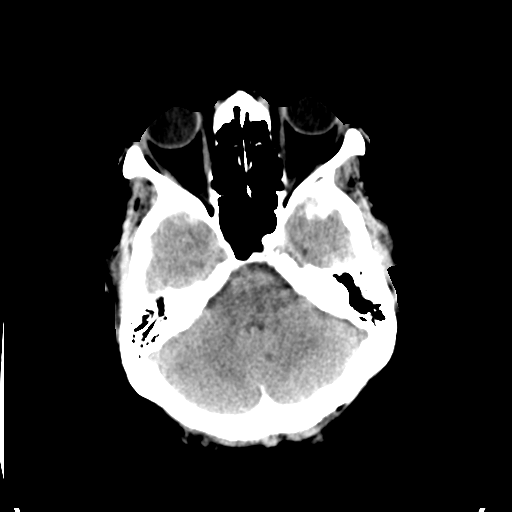
[im 12/32  brain]
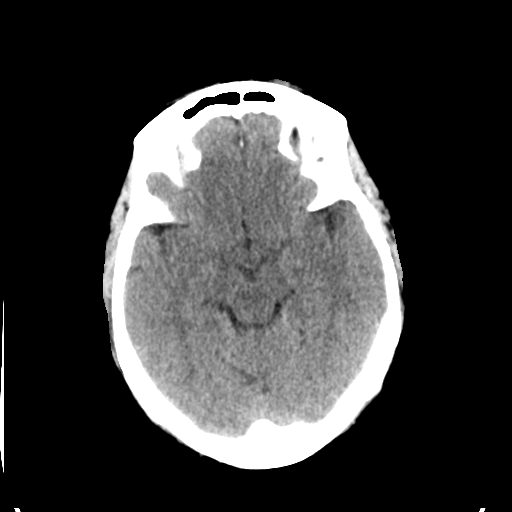
[im 16/32  brain]
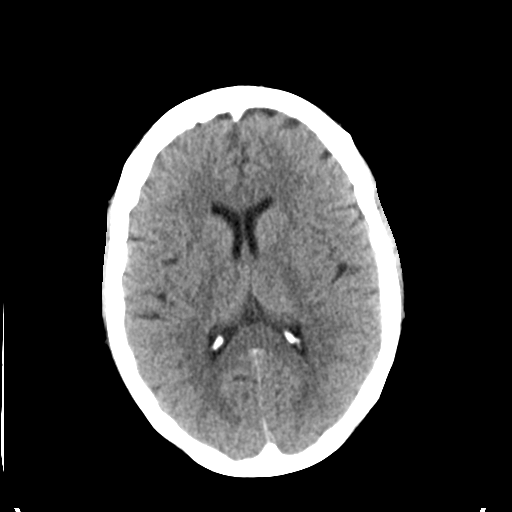
[im 20/32  brain]
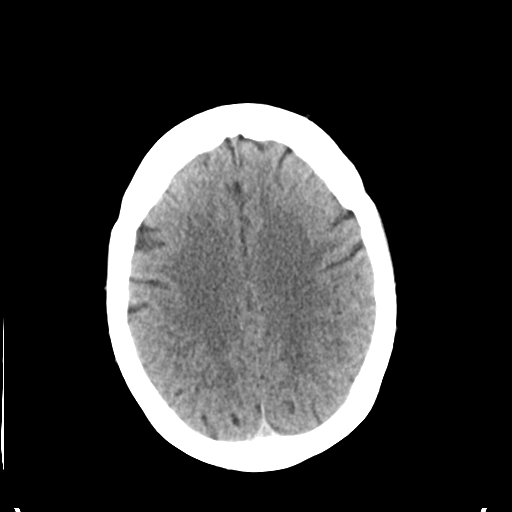
[im 20/32  bone]
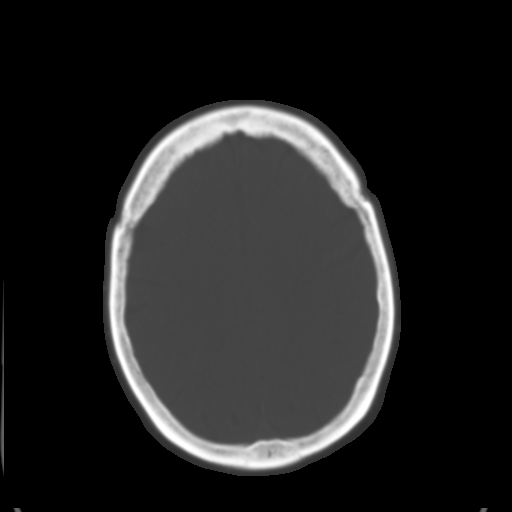
[im 24/32  brain]
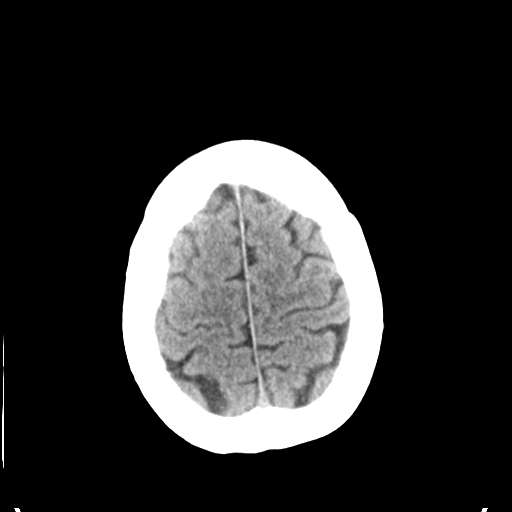
[im 28/32  brain]
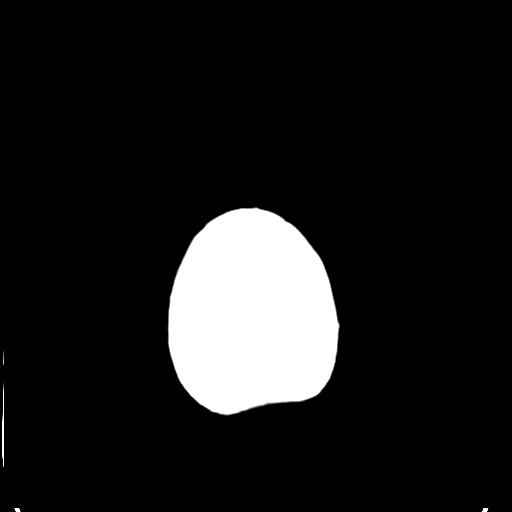

[Series 4: head bone · axial · 0.45mm/px · z∈[-71,-17]mm · 4 of 78 slices shown]
[im 8/78  bone]
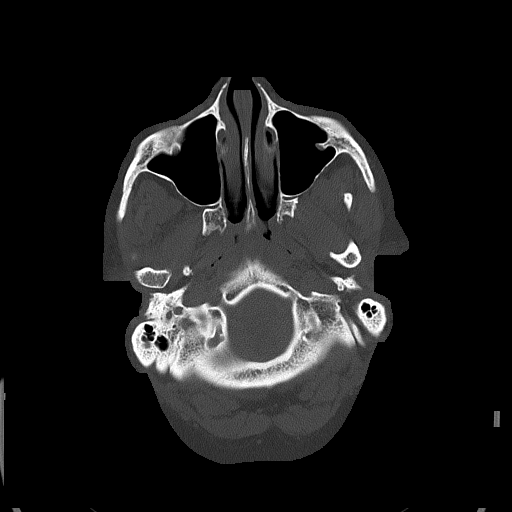
[im 16/78  bone]
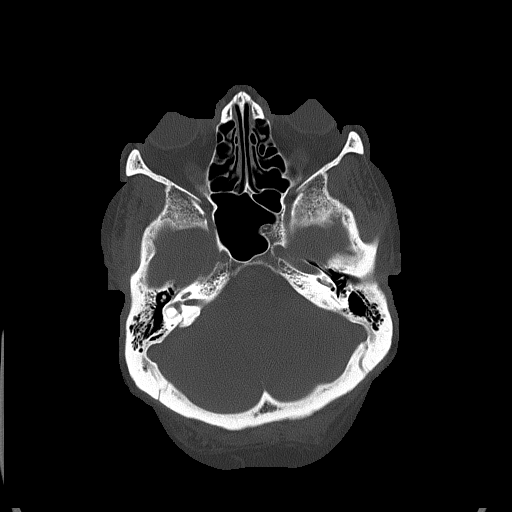
[im 24/78  bone]
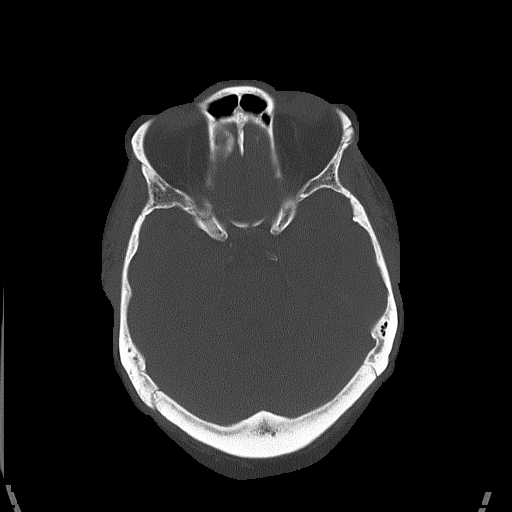
[im 35/78  bone]
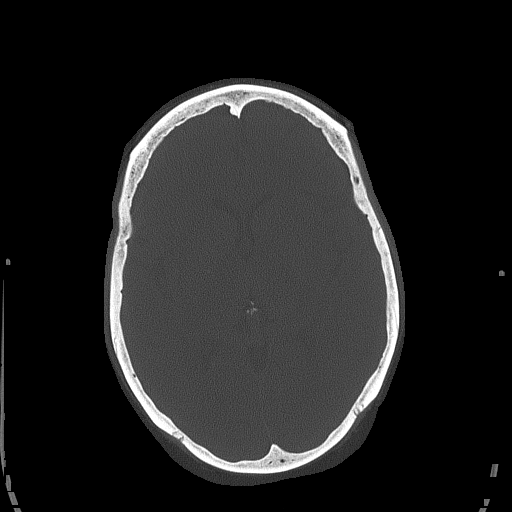

[Series 5: head without cor · coronal · non-contrast · 0.31mm/px · 3 of 67 slices shown]
[im 23/67  brain]
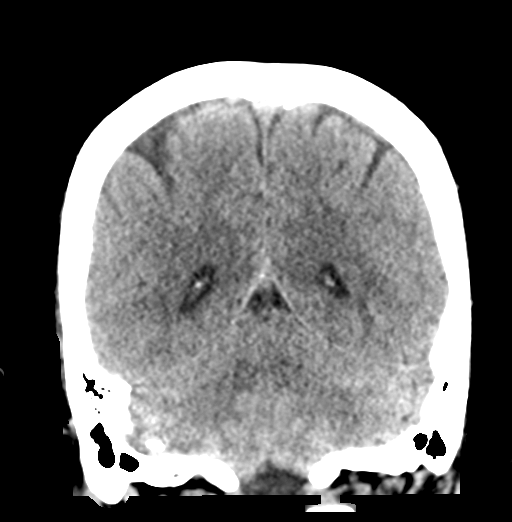
[im 30/67  brain]
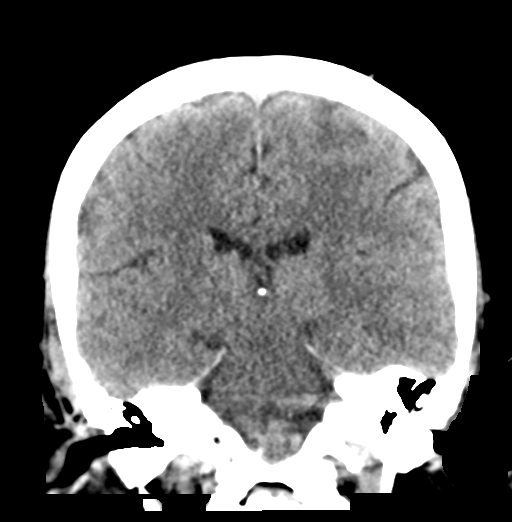
[im 37/67  brain]
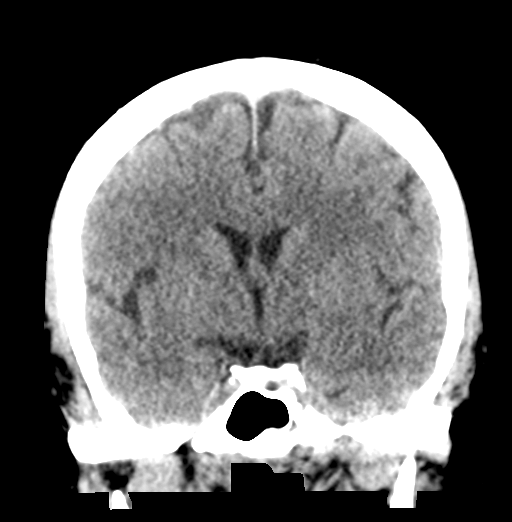

[Series 6: head without sag · sagittal · non-contrast · 0.31mm/px · 3 of 67 slices shown]
[im 23/67  brain]
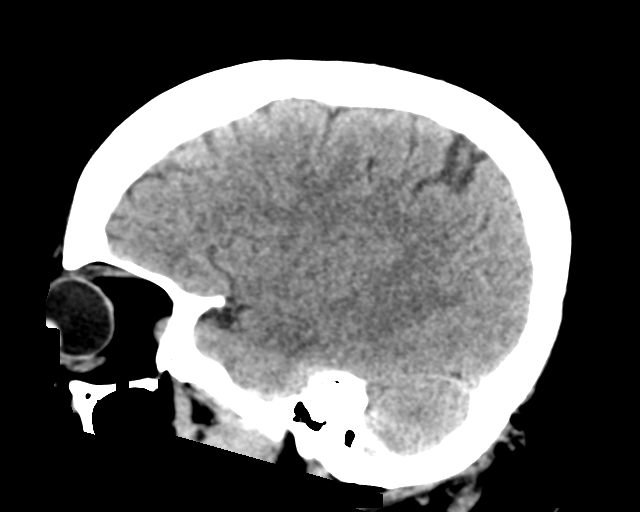
[im 34/67  brain]
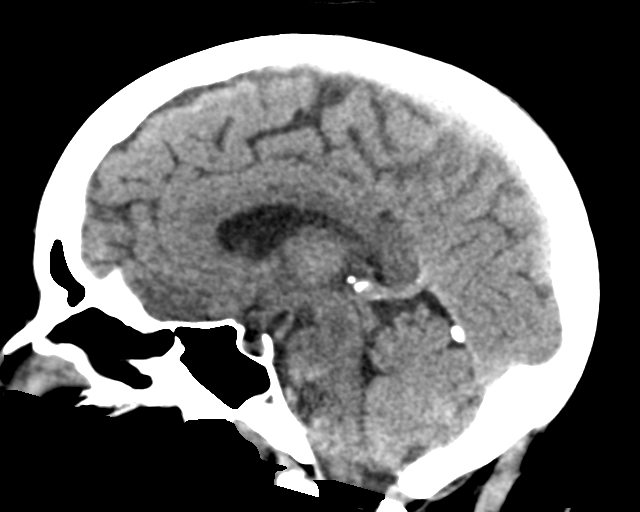
[im 45/67  brain]
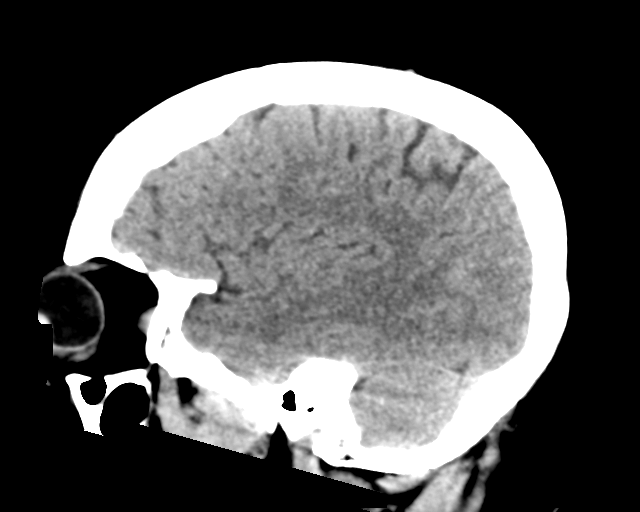

[17 of 47 positions shown; findings below may reference images not displayed]

FINDINGS: Brain: The brain shows a normal appearance without evidence of
malformation, atrophy, old or acute small or large vessel
infarction, mass lesion, hemorrhage, hydrocephalus or extra-axial
collection.

Vascular: No hyperdense vessel. No evidence of atherosclerotic
calcification.

Skull: Normal.  No traumatic finding.  No focal bone lesion.

Sinuses/Orbits: Sinuses are clear. Orbits appear normal. Mastoids
are clear.

Other: None significant
IMPRESSION: Normal head CT.

## 2020-03-18 MED ORDER — OLMESARTAN-AMLODIPINE-HCTZ 20-5-12.5 MG PO TABS: 1.0000 | ORAL_TABLET | Freq: Every morning | ORAL | Status: DC

## 2020-03-18 MED ORDER — APIXABAN 5 MG PO TABS
5.0000 mg | ORAL_TABLET | Freq: Two times a day (BID) | ORAL | Status: DC
  Administered 2020-03-18: 5 mg via ORAL
  Filled 2020-03-18: qty 1

## 2020-03-18 MED ORDER — APIXABAN 5 MG PO TABS: 5.0000 mg | ORAL_TABLET | Freq: Two times a day (BID) | ORAL | 0 refills | Status: DC

## 2020-03-18 MED ORDER — POTASSIUM CHLORIDE CRYS ER 20 MEQ PO TBCR
40.0000 meq | EXTENDED_RELEASE_TABLET | Freq: Once | ORAL | Status: AC
  Administered 2020-03-18: 40 meq via ORAL
  Filled 2020-03-18: qty 2

## 2020-03-18 MED ORDER — IRBESARTAN 150 MG PO TABS
75.0000 mg | ORAL_TABLET | Freq: Every day | ORAL | Status: DC
  Administered 2020-03-18: 75 mg via ORAL
  Filled 2020-03-18: qty 1

## 2020-03-18 MED ORDER — HYDROCHLOROTHIAZIDE 12.5 MG PO CAPS
12.5000 mg | ORAL_CAPSULE | Freq: Every day | ORAL | Status: DC
  Administered 2020-03-18: 12.5 mg via ORAL
  Filled 2020-03-18: qty 1

## 2020-03-18 MED ORDER — TOPIRAMATE 25 MG PO TABS: 100.0000 mg | ORAL_TABLET | Freq: Every day | ORAL | Status: DC

## 2020-03-18 MED ORDER — DILTIAZEM HCL ER COATED BEADS 120 MG PO CP24: 120.0000 mg | ORAL_CAPSULE | Freq: Every day | ORAL | 0 refills | Status: DC

## 2020-03-18 NOTE — Discharge Summary (Signed)
Family Medicine Teaching [facility] Discharge Summary  Patient name: [name] Medical record number: [id number] Date of birth: Jun 23, 1960 Age: 60 y.o. Gender: female Date of Admission: 03/16/2020  Date of Discharge: 03/18/20 Admitting Physician: Nestor Ramp, MD  Primary Care Provider: Dorothyann Peng, MD Consultants: Cardiology, Neurology   Indication for Hospitalization: Paroxysmal atrial fibrillation   Discharge Diagnoses/Problem List:   Paroxysmal atrial fibrillation Hypertension Coronary artery disease  Disposition: Home   Discharge Condition: stable  Discharge Exam:   Temp:  [98 F (36.7 C)-98.4 F (36.9 C)] 98 F (36.7 C) (03/07 2000) Pulse Rate:  [65-100] 85 (03/07 2000) Resp:  [10-22] 22 (03/07 2000) BP: (92-120)/(57-75) 105/69 (03/07 2000) SpO2:  [97 %-100 %] 100 % (03/07 2000)  Physical Exam: General: well appearing, pleasant, NAD Cardiovascular: RRR no murmurs  Respiratory: CTAB Normal WOB  Abdomen: soft, non distended Extremities: warm, dry. No edema. Distal pulses 2+ Neuro: CN 2-12 in tact. 5/5 strength in upper and lower extremities  Brief Hospital Course:   [name] is a 60 y.o. female presenting with increased chest pressure and palpitations, found to have new onset A. fib with RVR. PMH is significant for spontaneous arachnoid hemorrhage, CAD, HTN, hypothyroidism, depression.   New onset Afib with RVR  Patient presented with 1 day of chest pressure, palpitations and increased shortness of breath. On arrival, HR 153, BP 140/100. EKG showing A. fib with RVR. Troponin 20>21. CXR without cardiopulmonary abnormality. Cardizem drip started in ED. Patient received 1500L NS bolus. On physical exam HR irregularly irregular. New onset a fib likely due to coronary artery disease. Patient was consulted by cardiology and was ultimately transitioned off of cardizem drip and was put on oral diltiazem which she was continued on at discharge. Her blood  pressure medication were held due to her low blood pressure, but was recommended to restart at discharge since her Bps had normalized.     Hx of Right MCA stroke  Spontaneous arachnoid hemorrhage July 27th 2021 presented with leg heaviness and facial numbness which head CT revealed a right-sided subarachnoid hemorrhage, in which she was treated with aspirin and Plavix. MRI wo contrast was obtained which was negative for acute infarction, hemorrhage, or mass. Her neurologic status was monitored every 4 hours. Patient was ultimately transitioned from IV heparin to Eliquis once stable and upon confirmation that her head CT was normal. She remained hemodynamically stable during hospitalization.   All other medical conditions chronic and stable   Issues for Follow Up:   - Follow up with Cardiology outpatient to follow up on a fib and new medication - Follow up with PCP to check on blood pressure and medication - Outpatient sleep study to resume CPAP  Significant Procedures: None   Significant Labs and Imaging:  Recent Labs  Lab 03/16/20 1119 03/17/20 0115 03/17/20 0956 03/18/20 0205  WBC 8.9 8.4  --  8.5  HGB 12.8 11.2* 12.0 11.0*  HCT 39.6 36.0 37.1 35.2*  PLT 336 290  --  247   Recent Labs  Lab 03/11/20 1655 03/16/20 1119 03/16/20 1321 03/17/20 0115 03/18/20 0205 03/18/20 0945  NA 142 139  --  138 139  --   K 4.2 4.4  --  3.2* 3.7  --   CL 106 107  --  108 109  --   CO2 21 22  --  21* 22  --   GLUCOSE 101* 146*  --  143* 112*  --   BUN 15 13  --  13 14  --   CREATININE 0.97 1.07*  --  0.98 0.87  --   CALCIUM 9.9 9.7  --  9.0 9.2  --   MG  --   --  2.0 2.0  --  2.0  ALKPHOS 87  --   --   --   --   --   AST 19  --   --   --   --   --   ALT 21  --   --   --   --   --   ALBUMIN 4.7  --   --   --   --   --     Results/Tests Pending at Time of Discharge: None  Discharge Medications:  Allergies as of 03/18/2020      Reactions   Other Itching, Other (See Comments)    Patient experienced intense itching in her arm that was accessed for a past angiogram      Medication List    STOP taking these medications   Bayer Low Dose 81 MG EC tablet Generic drug: aspirin   carvedilol 6.25 MG tablet Commonly known as: COREG   HYDROcodone-acetaminophen 5-325 MG tablet Commonly known as: NORCO/VICODIN   meloxicam 15 MG tablet Commonly known as: Mobic   mometasone 0.1 % cream Commonly known as: Elocon     TAKE these medications   acetaminophen 500 MG tablet Commonly known as: TYLENOL Take 500-1,000 mg by mouth every 6 (six) hours as needed (pain or headaches).   apixaban 5 MG Tabs tablet Commonly known as: ELIQUIS Take 1 tablet (5 mg total) by mouth 2 (two) times daily.   atorvastatin 80 MG tablet Commonly known as: LIPITOR Take 1 tablet (80 mg total) by mouth daily at 6 PM. What changed: when to take this   cholecalciferol 25 MCG (1000 UNIT) tablet Commonly known as: VITAMIN D3 Take 1,000 Units by mouth daily.   diltiazem 120 MG 24 hr capsule Commonly known as: CARDIZEM CD Take 1 capsule (120 mg total) by mouth daily. Start taking on: March 19, 2020   ezetimibe 10 MG tablet Commonly known as: ZETIA TAKE 1 TABLET BY MOUTH EVERY DAY   fluticasone 50 MCG/ACT nasal spray Commonly known as: FLONASE Place 2 sprays into both nostrils daily as needed for allergies or rhinitis.   hydrOXYzine 25 MG tablet Commonly known as: ATARAX/VISTARIL Take 1 tablet (25 mg total) by mouth 3 (three) times daily as needed. What changed: reasons to take this   levocetirizine 5 MG tablet Commonly known as: XYZAL Take 1 tablet (5 mg total) by mouth daily.   Magnesium 250 MG Tabs TAKE 1 TABLET BY MOUTH DAILY WITH EVENING MEALS What changed:   how much to take  how to take this  when to take this  additional instructions   Olmesartan-amLODIPine-HCTZ 20-5-12.5 MG Tabs TAKE 1 TABLET BY MOUTH EVERY DAY What changed: when to take this   tiZANidine 4 MG  tablet Commonly known as: ZANAFLEX Take 1 tablet (4 mg total) by mouth every 8 (eight) hours as needed for muscle spasms.   topiramate 100 MG tablet Commonly known as: TOPAMAX Take 1 tablet (100 mg total) by mouth at bedtime.       Discharge Instructions: Please refer to Patient Instructions section of EMR for full details.  Patient was counseled important signs and symptoms that should prompt return to medical care, changes in medications, dietary instructions, activity restrictions, and follow up appointments.   Follow-Up Appointments:  Cora Collum, DO 03/18/2020, 2:28 PM PGY-1, [facility]

## 2020-03-18 NOTE — Progress Notes (Signed)
ANTICOAGULATION CONSULT NOTE - Follow Up Consult  Pharmacy Consult for heparin Indication: atrial fibrillation  Labs: Recent Labs    03/16/20 1119 03/16/20 1321 03/17/20 0115 03/17/20 0956 03/17/20 1647 03/18/20 0205  HGB 12.8  --  11.2* 12.0  --  11.0*  HCT 39.6  --  36.0 37.1  --  35.2*  PLT 336  --  290  --   --  247  HEPARINUNFRC  --   --   --   --  0.20* 0.34  CREATININE 1.07*  --  0.98  --   --   --   TROPONINIHS 20* 21*  --   --   --   --     Assessment/Plan:  60yo female therapeutic on heparin after rate change. Will continue gtt at current rate of 1000 units/hr and confirm stable with additional level.   Vernard Gambles, PharmD, BCPS  03/18/2020,3:14 AM

## 2020-03-18 NOTE — Progress Notes (Signed)
Night check on patient to monitor mental status given prior [facility] and currently on anticoagulation. She reports that at the time of the [facility] she had also had a stroke and a very intense headache. Patient reports that she is doing well and feels pretty good. She denies HA, diplopia, sensory changes, difficulty swallowing, weakness.   Gen: well-appearing, NAD, initially asleep but easily woken Neuro:  CN III, IV,VI: EOMI  CVII: Symmetric smile and brow raise  CN VIII: Normal hearing  CN XI: 5/5 shoulder shrug  UE and LE strength 5/5  Normal sensation in UE and LE bilaterally    Alana Lilland, DO

## 2020-03-18 NOTE — Discharge Instructions (Signed)
  You were started on Eliquis during this hospitalization- continue 5mg  twice a day. You can stop taking aspirin. Take your other medication as you normally have been.   Return to the hospital if you experience signs of stroke, chest pain, or palpitations.   Please schedule a follow up appointment with your PCP within the next week.   Take care!   Information on my medicine - ELIQUIS (apixaban)  Why was Eliquis prescribed for you? Eliquis was prescribed for you to reduce the risk of forming blood clots that can cause a stroke if you have a medical condition called atrial fibrillation (a type of irregular heartbeat) OR to reduce the risk of a blood clots forming after orthopedic surgery.  What do You need to know about Eliquis ? Take your Eliquis TWICE DAILY - one tablet in the morning and one tablet in the evening with or without food.  It would be best to take the doses about the same time each day.  If you have difficulty swallowing the tablet whole please discuss with your pharmacist how to take the medication safely.  Take Eliquis exactly as prescribed by your doctor and DO NOT stop taking Eliquis without talking to the doctor who prescribed the medication.  Stopping may increase your risk of developing a new clot or stroke.  Refill your prescription before you run out.  After discharge, you should have regular check-up appointments with your healthcare provider that is prescribing your Eliquis.  In the future your dose may need to be changed if your kidney function or weight changes by a significant amount or as you get older.  What do you do if you miss a dose? If you miss a dose, take it as soon as you remember on the same day and resume taking twice daily.  Do not take more than one dose of ELIQUIS at the same time.  Important Safety Information A possible side effect of Eliquis is bleeding. You should call your healthcare provider right away if you experience any of the  following: ? Bleeding from an injury or your nose that does not stop. ? Unusual colored urine (red or dark brown) or unusual colored stools (red or black). ? Unusual bruising for unknown reasons. ? A serious fall or if you hit your head (even if there is no bleeding).  Some medicines may interact with Eliquis and might increase your risk of bleeding or clotting while on Eliquis. To help avoid this, consult your healthcare provider or pharmacist prior to using any new prescription or non-prescription medications, including herbals, vitamins, non-steroidal anti-inflammatory drugs (NSAIDs) and supplements.  This website has more information on Eliquis (apixaban): www. .

## 2020-03-18 NOTE — Progress Notes (Incomplete)
Pharmacy Student Note: For Learning Purposes ONLY. Not an active part of the chart.   Chief Complaint: increased chest pressure, palpitations. Found to be in new onset Afib.     PMH: SAH, CAD, HTN, hypothyroid, depression   PSH:    Recommendations:     Admit Complaint:  #1 New onset Afib  -HR 70 -BP 130s-140s Home meds: carvedilol, statin Inpt meds: heparin 1000 units/hr (therapeutc x1 3/8 300) converted out of Afib 3/7 AM HCT: 35.2 Hgb: 11.0  A: stable  P: holding carvedilol d/t soft BP. Continuing statin. Diltiazem 120 mg PO. F/u ECHO. Repeat jead CT 24 hours post therapeutic x 2 checks. Confirmatory HL 1000.   #2 HX right MCA stroke w/ SAH (Spontaneous arach. Hem) -MRI neg-no infarct, hemorrhage or mass Home meds: aspirin, plavix (holding both) A: P: -CT 24 hours after starting hep   #3 chest pain; CAD with PCI -ECHO 07/2019: EF 60-65%  #4 HTN/HLD BP: 130s-140s LDL A: P:inpt meds: atorvastatin 80 mg daily, ezetimibe 10 mg daily   #5 hypoK K: 3.2 > 3.7 NSR Replacement: KCl 40   #6 HLD -home meds:    #7 HA -home meds: topiramate 100 mg qhs (previously held) Patient reports dull headache since starting heparin A: no current migraine, though without prevention P: restart topiramate 100 mg qhs to prevent migraine HA.    Mila Merry PharmD Student

## 2020-03-18 NOTE — Progress Notes (Signed)
Patient being discharged on Eliquis (new) cards provided for both 30 day free and copay assist for pt to use at her outside pharmacy.

## 2020-03-19 ENCOUNTER — Telehealth: Payer: Self-pay

## 2020-03-19 NOTE — Telephone Encounter (Signed)
Transition Care Management Follow-up Telephone Call  Date of discharge and from where: 03/18/2020 [facility]  How have you been since you were released from the hospital? fine  Any questions or concerns? no  Items Reviewed:  Did the pt receive and understand the discharge instructions provided? yes  Medications obtained and verified? yes  Any new allergies since your discharge? no  Dietary orders reviewed? No  Do you have support at home? Yes  Other (ie: DME, Home Health, etc) no  Functional Questionnaire: (I = Independent and D = Dependent)  Bathing/Dressing- I   Meal Prep- I  Eating- I  Maintaining continence- I  Transferring/Ambulation- I  Managing Meds- I   Follow up appointments reviewed:    PCP Hospital f/u appt confirmed?Yes scheduled to see Lavera Guise, FNP-BC  on 03/20/2020 @ 11:30  Specialist Hospital f/u appt confirmed?n/a  Are transportation arrangements needed? No  If their condition worsens, is the pt aware to call  their PCP or go to the ED? yes  Was the patient provided with contact information for the PCP's office or ED? yes  Was the pt encouraged to call back with questions or concerns? yes

## 2020-03-20 ENCOUNTER — Encounter: Payer: Self-pay | Admitting: Nurse Practitioner

## 2020-03-20 ENCOUNTER — Other Ambulatory Visit: Payer: Self-pay

## 2020-03-20 ENCOUNTER — Ambulatory Visit (INDEPENDENT_AMBULATORY_CARE_PROVIDER_SITE_OTHER): Payer: 59 | Admitting: Nurse Practitioner

## 2020-03-20 VITALS — BP 132/76 | HR 88 | Temp 98.7°F | Ht 61.0 in | Wt 197.6 lb

## 2020-03-20 DIAGNOSIS — I4891 Unspecified atrial fibrillation: Secondary | ICD-10-CM

## 2020-03-20 DIAGNOSIS — I270 Primary pulmonary hypertension: Secondary | ICD-10-CM

## 2020-03-20 DIAGNOSIS — J069 Acute upper respiratory infection, unspecified: Secondary | ICD-10-CM | POA: Diagnosis not present

## 2020-03-20 DIAGNOSIS — R059 Cough, unspecified: Secondary | ICD-10-CM | POA: Diagnosis not present

## 2020-03-20 DIAGNOSIS — Z6836 Body mass index (BMI) 36.0-36.9, adult: Secondary | ICD-10-CM

## 2020-03-20 DIAGNOSIS — Z20822 Contact with and (suspected) exposure to covid-19: Secondary | ICD-10-CM

## 2020-03-20 LAB — POC COVID19 BINAXNOW: SARS Coronavirus 2 Ag: NEGATIVE

## 2020-03-20 MED ORDER — AZITHROMYCIN 250 MG PO TABS: ORAL_TABLET | ORAL | 0 refills | Status: DC

## 2020-03-20 NOTE — Patient Instructions (Signed)

## 2020-03-20 NOTE — Progress Notes (Signed)
I,Tianna Badgett,acting as a Neurosurgeon for [employer], NP.,have documented all relevant documentation on the behalf of [employer], NP,as directed by  Charlesetta Ivory, NP while in the presence of Charlesetta Ivory, NP.  This visit occurred during the SARS-CoV-2 public health emergency.  Safety protocols were in place, including screening questions prior to the visit, additional usage of staff PPE, and extensive cleaning of exam room while observing appropriate contact time as indicated for disinfecting solutions.  Subjective:     Patient ID: [name] , female    DOB: 1960/11/25 , 60 y.o.   MRN: [id number]   Chief Complaint  Patient presents with  . Atrial Fibrillation    HPI  Patient is here for hospital follow up. She is better since her discharge. She was admitted to the hospital with A-fibb with RVR. She She has concerns with cold symptoms since leaving the hospital. She goes on the 3/17 for a follow up visit with her cardiologist. No SOB, chest pain or palpitations.  Sneezing and coughing today after she was discharged for the hospital. No fever.     Past Medical History:  Diagnosis Date  . Allergy   . Arthritis    back   . Atypical chest pain 06/30/2016  . Back pain    DUE TO INJURY AT WORK ON05/2013  . Bruises easily   . Chronic lower back pain   . Coronary artery disease   . Depression    was on meds 2 yrs ago   . Headache(784.0)    rarely  . History of bronchitis    last time about 43yrs ago   . Hypertension    takes Benicar daily  . Hypothyroidism 06/30/2016  . Pure hypercholesterolemia 05/31/2019  . Vitamin D deficiency    takes Vitamin D 2 times/wk  . Weakness    and numbness right foot     Family History  Problem Relation Age of Onset  . Heart disease Mother   . CAD Mother   . Stroke Mother   . Heart disease Sister   . Heart attack Sister   . Liver disease Brother   . Other Father        unknown medical history  . CAD Brother   .  Colon cancer Neg Hx   . Esophageal cancer Neg Hx   . Rectal cancer Neg Hx   . Stomach cancer Neg Hx      Current Outpatient Medications:  .  azithromycin (ZITHROMAX) 250 MG tablet, Take as directed, Disp: 6 each, Rfl: 0 .  acetaminophen (TYLENOL) 500 MG tablet, Take 500-1,000 mg by mouth every 6 (six) hours as needed (pain or headaches)., Disp: , Rfl:  .  apixaban (ELIQUIS) 5 MG TABS tablet, Take 1 tablet (5 mg total) by mouth 2 (two) times daily., Disp: 60 tablet, Rfl: 0 .  atorvastatin (LIPITOR) 80 MG tablet, Take 1 tablet (80 mg total) by mouth daily at 6 PM. (Patient taking differently: Take 80 mg by mouth daily.), Disp: 90 tablet, Rfl: 2 .  cholecalciferol (VITAMIN D3) 25 MCG (1000 UNIT) tablet, Take 1,000 Units by mouth daily. , Disp: , Rfl:  .  diltiazem (CARDIZEM CD) 120 MG 24 hr capsule, Take 1 capsule (120 mg total) by mouth daily., Disp: 30 capsule, Rfl: 0 .  ezetimibe (ZETIA) 10 MG tablet, TAKE 1 TABLET BY MOUTH EVERY DAY (Patient taking differently: Take 10 mg by mouth daily.), Disp: 90 tablet, Rfl: 3 .  fluticasone (FLONASE) 50 MCG/ACT  nasal spray, Place 2 sprays into both nostrils daily as needed for allergies or rhinitis., Disp: 16 g, Rfl: 1 .  hydrOXYzine (ATARAX/VISTARIL) 25 MG tablet, Take 1 tablet (25 mg total) by mouth 3 (three) times daily as needed. (Patient taking differently: Take 25 mg by mouth 3 (three) times daily as needed (AS DIRECTED).), Disp: 30 tablet, Rfl: 0 .  levocetirizine (XYZAL) 5 MG tablet, Take 1 tablet (5 mg total) by mouth daily., Disp: 90 tablet, Rfl: 1 .  Magnesium 250 MG TABS, TAKE 1 TABLET BY MOUTH DAILY WITH EVENING MEALS (Patient taking differently: Take 250 mg by mouth at bedtime.), Disp: 90 tablet, Rfl: 2 .  Olmesartan-amLODIPine-HCTZ 20-5-12.5 MG TABS, TAKE 1 TABLET BY MOUTH EVERY DAY (Patient taking differently: Take 1 tablet by mouth in the morning.), Disp: 90 tablet, Rfl: 1 .  tiZANidine (ZANAFLEX) 4 MG tablet, Take 1 tablet (4 mg total) by  mouth every 8 (eight) hours as needed for muscle spasms., Disp: 30 tablet, Rfl: 0 .  topiramate (TOPAMAX) 100 MG tablet, Take 1 tablet (100 mg total) by mouth at bedtime., Disp: 90 tablet, Rfl: 4   Allergies  Allergen Reactions  . Other Itching and Other (See Comments)    Patient experienced intense itching in her arm that was accessed for a past angiogram     Review of Systems  Constitutional: Negative.  Negative for fatigue and fever.  HENT: Positive for congestion and sneezing. Negative for sinus pressure and sore throat.   Respiratory: Positive for cough and wheezing. Negative for choking, chest tightness and shortness of breath.        Congestion  Cardiovascular: Negative.  Negative for chest pain and palpitations.  Gastrointestinal: Negative.   Endocrine: Negative for polydipsia, polyphagia and polyuria.  Neurological: Negative.  Negative for headaches.     Today's Vitals   03/20/20 1143  BP: 132/76  Pulse: 88  Temp: 98.7 F (37.1 C)  TempSrc: Oral  Weight: 197 lb 9.6 oz (89.6 kg)  Height: 5\' 1"  (1.549 m)   Body mass index is 37.34 kg/m.   Objective:  Physical Exam Constitutional:      Appearance: Normal appearance. She is obese.  HENT:     Head: Normocephalic and atraumatic.  Cardiovascular:     Rate and Rhythm: Normal rate and regular rhythm.     Pulses: Normal pulses.     Heart sounds: Normal heart sounds. No murmur heard.   Pulmonary:     Effort: Pulmonary effort is normal. No respiratory distress.     Breath sounds: Normal breath sounds. No wheezing.  Musculoskeletal:     Cervical back: Neck supple.  Skin:    General: Skin is warm and dry.     Capillary Refill: Capillary refill takes less than 2 seconds.  Neurological:     Mental Status: She is alert and oriented to person, place, and time.  Psychiatric:        Mood and Affect: Mood normal.        Behavior: Behavior normal.        Thought Content: Thought content normal.        Judgment: Judgment  normal.       Assessment And Plan:     1. Atrial fibrillation with rapid ventricular response (HCC) -Patient is stable, will continue to follow up with her cardiologist. -Continue diltiazem by mouth once daily    2. Pulmonary hypertension, primary (HCC) -Chronic, stable  -BP controlled  -Will continue back on her BP  medications that were held in the hospital. And will follow up with her cardiologist.  -Educated patient about a heart healthy diet which includes low sodium, low   3. Upper respiratory infection with cough and congestion -will start patient on antx due to her comorbidities and history.  - azithromycin (ZITHROMAX) 250 MG tablet; Take as directed  Dispense: 6 each; Refill: 0 -Educated patient if symptoms don't get better to improve to follow up   4. Cough -Cough with phlegm  - azithromycin (ZITHROMAX) 250 MG tablet; Take as directed  Dispense: 6 each; Refill: 0  5.Contact with and (susptected) exposure to COVID 19 -Novel coronavirus, NAA- negative   6. Class 2 severe obesity due to excess calories with serious comorbidity and body mass index (BMI) of 36.0 to 36.9 in adult [facility]) -Educated patient about the importance of exercise and eating healthy.     Patient was given opportunity to ask questions. Patient verbalized understanding of the plan and was able to repeat key elements of the plan. All questions were answered to their satisfaction.  Charlesetta Ivory, NP   I, Charlesetta Ivory, NP, have reviewed all documentation for this visit. The documentation on 03/20/20 for the exam, diagnosis, procedures, and orders are all accurate and complete.  THE PATIENT IS ENCOURAGED TO PRACTICE SOCIAL DISTANCING DUE TO THE COVID-19 PANDEMIC.

## 2020-03-21 LAB — SARS-COV-2, NAA 2 DAY TAT

## 2020-03-21 LAB — NOVEL CORONAVIRUS, NAA: SARS-CoV-2, NAA: NOT DETECTED

## 2020-03-25 ENCOUNTER — Telehealth: Payer: Self-pay

## 2020-03-25 ENCOUNTER — Ambulatory Visit: Payer: 59 | Admitting: Cardiovascular Disease

## 2020-03-25 NOTE — Telephone Encounter (Signed)
Left pt a message confirming the diagnoses wanting to be used on the continuing disability form. Told pt to call us back at her earliest convenience.

## 2020-03-25 NOTE — Progress Notes (Unsigned)
Virtual Visit via Telephone Note   This visit type was conducted due to national recommendations for restrictions regarding the COVID-19 Pandemic (e.g. social distancing) in an effort to limit this patient's exposure and mitigate transmission in our community.  Due to her co-morbid illnesses, this patient is at least at moderate risk for complications without adequate follow up.  This format is felt to be most appropriate for this patient at this time.  The patient did not have access to video technology/had technical difficulties with video requiring transitioning to audio format only (telephone).  All issues noted in this document were discussed and addressed.  No physical exam could be performed with this format.  Please refer to the patient's chart for her  consent to telehealth for [facility].  Evaluation Performed:  Follow-up visit  This visit type was conducted due to national recommendations for restrictions regarding the COVID-19 Pandemic (e.g. social distancing).  This format is felt to be most appropriate for this patient at this time.  All issues noted in this document were discussed and addressed.  No physical exam was performed (except for noted visual exam findings with Video Visits).  Please refer to the patient's chart (MyChart message for video visits and phone note for telephone visits) for the patient's consent to telehealth for [facility]  Date:  03/25/2020   ID:  [name], DOB 10-Dec-1960, MRN [id number]  Patient Location:  [address] [place] [address]   Provider location:     [facility] [address] Office [phone] Fax [phone]   PCP:  Dorothyann Peng, MD  Cardiologist:  Chilton Si, MD  Electrophysiologist:  None   Chief Complaint: Follow-up evaluation for atrial fibrillation  History of Present Illness:    [name] is a 60 y.o. female who presents via  audio/video conferencing for a telehealth visit today.  Patient verified DOB and address.  She has a PMH of essential hypertension, coronary artery disease, bilateral carotid artery stenosis, acute CVA, atrial fibrillation with RVR, pulmonary hypertension, subarachnoid hemorrhage, hyperlipidemia, left hip pain, and hypokalemia.  She presents to the emergency department on 03/16/2020 and was discharged on 03/18/2020.  On arrival to the emergency department she complained of increased chest pressure and palpitations.  She was found to be in atrial fibrillation with RVR.  She reported that it had been present x1 day.  She noted increased shortness of breath.  Her blood pressure on arrival was 140/100 with a heart rate of 153.  Her EKG showed atrial fibrillation with RVR.  Her troponins were 20-21.  Chest x-ray showed no cardiopulmonary abnormalities.  She was started on Cardizem drip in the emergency department and received a 1.5 L saline bolus.  She was seen by cardiology and transitioned from Cardizem drip to p.o. diltiazem.  Her blood pressure medications were held during admission due to low blood pressure.  Her blood pressure normalized at discharge.  While in the hospital she was seen and evaluated by Dr. Duke Salvia.  The patient reported she had been drinking cappuccinos recently.  She was consuming at least 2 in the morning.  This was not normal for her.  She also reported having OSA and had not been using her CPAP.  She transitioned from atrial fibrillation to normal sinus rhythm and her rates were well controlled.  Her echocardiogram showed preserved EF.  Her heparin was transitioned to Eliquis.  She was instructed to limit her caffeine  and a outpatient sleep study was recommended for continuation of CPAP.  She is seen virtually today for follow-up and states she is feeling much better.  She has not had any further episodes of irregular or fast heartbeats.  She denies increased shortness of breath.  She  reports that she was in a very stressful job and was drinking more caffeine when her heart went out of rhythm.  She is now at a new job and will be doing training for 3 to 4 weeks.  She reports that she has a very strict schedule at this time.  We discussed the importance of having her come to the office for a nurse visit to do an EKG.  We will try to work with her in scheduling this.  She denied bleeding issues and reports compliance with her medications.  I will have her increase her physical activity as tolerated, continue her heart healthy diet, avoid triggers for atrial fibrillation, and follow-up in 3 months.  Today she denies chest pain, shortness of breath, lower extremity edema, fatigue, palpitations, melena, hematuria, hemoptysis, diaphoresis, weakness, presyncope, syncope, orthopnea, and PND.   The patient does not symptoms concerning for COVID-19 infection (fever, chills, cough, or new SHORTNESS OF BREATH).    Prior CV studies:   The following studies were reviewed today:  Echocardiogram 03/17/2020 IMPRESSIONS    1. Left ventricular ejection fraction, by estimation, is 60 to 65%. The  left ventricle has normal function. The left ventricle has no regional  wall motion abnormalities. There is mild concentric left ventricular  hypertrophy. Left ventricular diastolic  parameters are consistent with Grade I diastolic dysfunction (impaired  relaxation).  2. Right ventricular systolic function is normal. The right ventricular  size is mildly enlarged. There is mildly elevated pulmonary artery  systolic pressure. The estimated right ventricular systolic pressure is  36.4 mmHg.  3. The mitral valve is grossly normal. Mild mitral valve regurgitation.  4. Tricuspid valve regurgitation is mild to moderate.  5. There is flow acceleration noted in the LVOT without evidence of SAM.  Likely related to LVOT narrowing during systole in the setting of septal  hypertrophy. Flow acceleration  occurs prior to the aortic valve with no  evidence of aortic stenosis. DI  0.7.  6. The aortic valve is tricuspid. There is mild calcification of the  aortic valve. There is mild thickening of the aortic valve. Aortic valve  regurgitation is mild. Mild aortic valve sclerosis is present, with no  evidence of aortic valve stenosis.  7. The inferior vena cava is normal in size with greater than 50%  respiratory variability, suggesting right atrial pressure of 3 mmHg.   Past Medical History:  Diagnosis Date  . Allergy   . Arthritis    back   . Atypical chest pain 06/30/2016  . Back pain    DUE TO INJURY AT WORK ON05/2013  . Bruises easily   . Chronic lower back pain   . Coronary artery disease   . Depression    was on meds 2 yrs ago   . Headache(784.0)    rarely  . History of bronchitis    last time about 28yrs ago   . Hypertension    takes Benicar daily  . Hypothyroidism 06/30/2016  . Pure hypercholesterolemia 05/31/2019  . Vitamin D deficiency    takes Vitamin D 2 times/wk  . Weakness    and numbness right foot   Past Surgical History:  Procedure Laterality Date  . BACK  SURGERY    . CARPAL TUNNEL RELEASE Right   . CESAREAN SECTION  1988  . COLONOSCOPY    . EXPLORATORY LAPAROTOMY  1991   "took out fatty tumor" (11/09/2012)  . IR ANGIO INTRA EXTRACRAN SEL COM CAROTID INNOMINATE BILAT MOD SED  08/10/2019  . IR ANGIO VERTEBRAL SEL VERTEBRAL BILAT MOD SED  08/10/2019  . IR US GUIDE VASC ACCESS RIGHT  08/10/2019  . LEFT HEART CATH AND CORONARY ANGIOGRAPHY N/A 08/06/2016   Procedure: Left Heart Cath and Coronary Angiography;  Surgeon: Lyn Records, MD;  Location[facility] INVAS[facility] CV LAB;  Service: Cardiovascular;  Laterality: N/A;  . LEFT HEART CATH AND CORONARY ANGIOGRAPHY N/A 07/12/2018   Procedure: LEFT HEART CATH AND CORONARY ANGIOGRAPHY;  Surgeon: Marykay Lex, MD;  Location: [facility] INVASIVE CV LAB;  Service: Cardiovascular;  Laterality: N/A;  . LUMBAR LAMINECTOMY/DECOMPRESSION  MICRODISCECTOMY  11/09/2012   "L3-4" (11/09/2012)  . LUMBAR LAMINECTOMY/DECOMPRESSION MICRODISCECTOMY N/A 11/09/2012   Procedure: L3-L4 DECOMPRESSION AND MICRODISCECTOMY   (1 LEVEL);  Surgeon: Venita Lick, MD;  Location: [facility];  Service: Orthopedics;  Laterality: N/A;     No outpatient medications have been marked as taking for the 03/26/20 encounter (Appointment) with Ronney Asters, NP.     Allergies:   Other   Social History   Tobacco Use  . Smoking status: Never Smoker  . Smokeless tobacco: Never Used  Vaping Use  . Vaping Use: Never used  Substance Use Topics  . Alcohol use: Yes    Alcohol/week: 7.0 standard drinks    Types: 7 Glasses of wine per week    Comment: social  . Drug use: No     Family Hx: The patient's family history includes CAD in her brother and mother; Heart attack in her sister; Heart disease in her mother and sister; Liver disease in her brother; Other in her father; Stroke in her mother. There is no history of Colon cancer, Esophageal cancer, Rectal cancer, or Stomach cancer.  ROS:   Please see the history of present illness.     All other systems reviewed and are negative.   Labs/Other Tests and Data Reviewed:    Recent Labs: 03/11/2020: ALT 21 03/17/2020: TSH 1.978 03/18/2020: BUN 14; Creatinine, Ser 0.87; Hemoglobin 11.0; Magnesium 2.0; Platelets 247; Potassium 3.7; Sodium 139   Recent Lipid Panel Lab Results  Component Value Date/Time   CHOL 90 03/17/2020 01:15 AM   CHOL 140 04/04/2019 08:20 AM   TRIG 42 03/17/2020 01:15 AM   HDL 41 03/17/2020 01:15 AM   HDL 46 04/04/2019 08:20 AM   CHOLHDL 2.2 03/17/2020 01:15 AM   LDLCALC 41 03/17/2020 01:15 AM   LDLCALC 80 04/04/2019 08:20 AM    Wt Readings from Last 3 Encounters:  03/20/20 197 lb 9.6 oz (89.6 kg)  03/16/20 195 lb (88.5 kg)  03/11/20 198 lb 6.4 oz (90 kg)     Exam:    Vital Signs:  LMP 06/03/2011    Well nourished, well developed female in no  acute distress.   ASSESSMENT  & PLAN:    1.  New onset atrial fibrillation-heart rate today 87.  Found to be in atrial fibrillation with RVR on presentation to the emergency department 03/16/2020.  She was placed on Cardizem drip and transitioned to normal sinus rhythm.  She was then placed on diltiazem 120 mg daily and Eliquis.  She was instructed to limit her caffeine and resume CPAP after outpatient sleep study.  Denies bleeding issues. Continue  diltiazem, apixaban Heart healthy low-sodium diet-salty 6 given Increase physical activity as tolerated Avoid triggers caffeine, chocolate, EtOH, dehydration etc. Nursing visit for EKG.  OSA-outpatient sleep study recommended. Discussed sleeping with head of bed elevated and on side.  Discussed importance of CPAP use.  Essential hypertension-BP today 129/78.  Blood pressure at home has been somewhat elevated. Continue carvedilol, olmesartan, amlodipine, HCTZ, diltiazem Heart healthy low-sodium diet-salty 6 given Increase physical activity as tolerated  Hyperlipidemia-03/17/2020: Cholesterol 90; HDL 41; LDL Cholesterol 41; Triglycerides 42; VLDL 8 Continue atorvastatin, Zetia Heart healthy low-sodium high-fiber diet Increase physical activity as tolerated  Carotid stenosis-right ICA 80-90% stenosis.  History of subarachnoid hemorrhage 7/29 in the setting of aspirin and Plavix. Continue atorvastatin, Zetia, Eliquis   Disposition: Follow-up with Dr. Duke Salviaandolph in 3 months.     COVID-19 Education: The signs and symptoms of COVID-19 were discussed with the patient and how to seek care for testing (follow up with PCP or arrange E-visit).  The importance of social distancing was discussed today.  Patient Risk:   After full review of this patients clinical status, I feel that they are at least moderate risk at this time.  Time:   Today, I have spent 12 minutes with the patient with telehealth technology discussing atrial fibrillation, atrial fibrillation triggers, OSA,  follow-up EKG, and medications.  I spent greater than 20 minutes reviewing her past medical history, prior cardiac test, and cardiac medications.   Medication Adjustments/Labs and Tests Ordered: Current medicines are reviewed at length with the patient today.  Concerns regarding medicines are outlined above.   Tests Ordered: No orders of the defined types were placed in this encounter.  Medication Changes: No orders of the defined types were placed in this encounter.   Disposition:  in 3 month(s)  Signed, Thomasene RippleJesse M. Cleaver NP-C    08/15/2018 11:58 AM    [facility] [address] Office [phone] Fax [phone]

## 2020-03-26 ENCOUNTER — Encounter: Payer: Self-pay | Admitting: General Practice

## 2020-03-26 ENCOUNTER — Telehealth: Payer: Self-pay | Admitting: General Practice

## 2020-03-26 ENCOUNTER — Telehealth: Payer: 59 | Admitting: Cardiology

## 2020-03-26 ENCOUNTER — Telehealth: Payer: Self-pay | Admitting: *Deleted

## 2020-03-26 ENCOUNTER — Telehealth (INDEPENDENT_AMBULATORY_CARE_PROVIDER_SITE_OTHER): Payer: 59 | Admitting: General Practice

## 2020-03-26 VITALS — BP 129/78 | HR 87 | Ht 61.0 in | Wt 192.0 lb

## 2020-03-26 DIAGNOSIS — I10 Essential (primary) hypertension: Secondary | ICD-10-CM

## 2020-03-26 DIAGNOSIS — Z9189 Other specified personal risk factors, not elsewhere classified: Secondary | ICD-10-CM

## 2020-03-26 DIAGNOSIS — E7800 Pure hypercholesterolemia, unspecified: Secondary | ICD-10-CM

## 2020-03-26 DIAGNOSIS — I4891 Unspecified atrial fibrillation: Secondary | ICD-10-CM

## 2020-03-26 DIAGNOSIS — I6523 Occlusion and stenosis of bilateral carotid arteries: Secondary | ICD-10-CM

## 2020-03-26 NOTE — Telephone Encounter (Signed)
Left message for patient to call and schedule 3-4 week nurse visit for an EKG and a 3 month follow up with Dr. Duke Salvia (per Secure Chat from April Garrison, [place]).

## 2020-03-26 NOTE — Patient Instructions (Signed)
Medication Instructions:  Your physician recommends that you continue on your current medications as directed. Please refer to the Current Medication list given to you today.  *If you need a refill on your cardiac medications before your next appointment, please call your pharmacy*   Lab Work: None If you have labs (blood work) drawn today and your tests are completely normal, you will receive your results only by: Marland Kitchen MyChart Message (if you have MyChart) OR . A paper copy in the mail If you have any lab test that is abnormal or we need to change your treatment, we will call you to review the results.   Testing/Procedures: Your physician has recommended that you have a sleep study. This test records several body functions during sleep, including: brain activity, eye movement, oxygen and carbon dioxide blood levels, heart rate and rhythm, breathing rate and rhythm, the flow of air through your mouth and nose, snoring, body muscle movements, and chest and belly movement.  Follow-Up: At [facility], you and your health needs are our priority.  As part of our continuing mission to provide you with exceptional heart care, we have created designated Provider Care Teams.  These Care Teams include your primary Cardiologist (physician) and Advanced Practice Providers (APPs -  Physician Assistants and Nurse Practitioners) who all work together to provide you with the care you need, when you need it.  Your next appointment:   3-4 weeks Nurse visit for EKG 3 month with Dr. Duke Salvia  Other Instructions Avoid triggers for Atrial Fibrillation..some common triggers are caffeine, chocolate, alcohol, dehydration Increase physical activity as tolerated.

## 2020-03-26 NOTE — Addendum Note (Signed)
Addended by: Cleda Mccreedy on: 03/26/2020 12:33 PM   Modules accepted: Orders

## 2020-03-26 NOTE — Telephone Encounter (Signed)
-----   Message from April Garrison, [place] sent at 03/26/2020 12:27 PM EDT ----- Regarding: Sleep Study [place]  I'm working remote with Edd Fabian, NP. I wasn't sure if I should send this msg to you or someone at [facility]. If it goes to someone else can you please fwd it to them.   Thanks, April G.

## 2020-03-27 ENCOUNTER — Telehealth: Payer: Self-pay | Admitting: *Deleted

## 2020-03-27 NOTE — Telephone Encounter (Signed)
Sleep study PA request faxed to [facility].@ [phone].

## 2020-03-27 NOTE — Telephone Encounter (Signed)
Received a call from Gene L with [facility] informing me that there was a PA for the sleep study submitted yesterday and the one submitted today will be withdrawn. There was no documentation that a prior PA was submitted.

## 2020-03-28 NOTE — Telephone Encounter (Signed)
Left message for patient to call and schedule the 3-+4 week nurse visit for an EKG and the 3 month follow up with Dr. Duke Salvia ordered by Edd Fabian, NP

## 2020-04-02 ENCOUNTER — Other Ambulatory Visit: Payer: Self-pay

## 2020-04-02 ENCOUNTER — Telehealth: Payer: Self-pay

## 2020-04-02 ENCOUNTER — Ambulatory Visit (INDEPENDENT_AMBULATORY_CARE_PROVIDER_SITE_OTHER): Payer: 59 | Admitting: Orthopaedic Surgery

## 2020-04-02 ENCOUNTER — Ambulatory Visit: Payer: Self-pay

## 2020-04-02 ENCOUNTER — Encounter: Payer: Self-pay | Admitting: Orthopaedic Surgery

## 2020-04-02 DIAGNOSIS — M1612 Unilateral primary osteoarthritis, left hip: Secondary | ICD-10-CM | POA: Diagnosis not present

## 2020-04-02 NOTE — Telephone Encounter (Signed)
Left message for patient to call and schedule a nurse visit for an EKG and a 3 month follow up with Dr. Duke Salvia as ordered by Edd Fabian, NP on 03/26/20

## 2020-04-02 NOTE — Progress Notes (Signed)
Office Visit Note   Patient: [name]           Date of Birth: 05-Sep-1960           MRN: [id number] Visit Date: 04/02/2020              Requested by: Dorothyann Peng, MD [address] [place],  [place] [address] PCP: Dorothyann Peng, MD   Assessment & Plan: Visit Diagnoses:  1. Unilateral primary osteoarthritis, left hip     Plan: Impression is left hip advanced degenerative joint disease.  The patient notes that her cardiologist has informed her that she will not be able to undergo total hip replacement in the near future so she would like to repeat cortisone injection today if possible.  We will refer her to Dr. Prince Rome for this.  Follow-up with [place] in the meantime.  Follow-Up Instructions: Return if symptoms worsen or fail to improve.   Orders:  Orders Placed This Encounter  Procedures  . US Guided Needle Placement - No Linked Charges   No orders of the defined types were placed in this encounter.     Procedures: No procedures performed   Clinical Data: No additional findings.   Subjective: Chief Complaint  Patient presents with  . Left Hip - Pain    HPI patient is a pleasant 60 year old female who comes in today with recurrent left hip pain.  She does have a history of advanced degenerative joint disease to the left hip.  She is been seen by [place] a few times for this where she has been referred to Dr. Prince Rome for ultrasound-guided cortisone injection.  Previous cortisone injections have helped quite a bit.  Her last injection was 11/20/2019.  She had great relief of symptoms until recently.  Her pain has returned and is located to the groin and anterior thigh all the way to the knee.  Worse going from a seated to standing position as well as with any flexion of the hip.  She has recently been hospitalized for atrial fibrillation and is now on Eliquis.  Prior to this, she was in the process of getting clearance from her neurologist from previous stroke in  order to undergo total hip arthroplasty.  Review of Systems as detailed in HPI.  All others reviewed and are negative.   Objective: Vital Signs: LMP 06/03/2011   Physical Exam well-developed well-nourished female no acute distress.  Alert and oriented x3.  Ortho Exam left hip exam reveals a positive logroll with very little rotation.  She is neurovascularly intact distally.  Specialty Comments:  No specialty comments available.  Imaging: No new imaging   PMFS History: Patient Active Problem List   Diagnosis Date Noted  . Atrial fibrillation with rapid ventricular response (HCC) 03/17/2020  . Hypokalemia   . Pulmonary hypertension, primary (HCC)   . Atrial fibrillation with RVR (HCC) 03/16/2020  . Elevated troponin   . Primary osteoarthritis of right knee 11/20/2019  . Cerebrovascular accident (CVA) (HCC) 10/18/2019  . ICAO (internal carotid artery occlusion), right 10/18/2019  . Primary osteoarthritis of left hip 09/11/2019  . SAH (subarachnoid hemorrhage) (HCC) 08/07/2019  . Acute embolic stroke (HCC)   . Left hip pain 06/25/2019  . Severe left groin pain 06/21/2019  . Bilateral carotid artery stenosis 06/21/2019  . Pure hypercholesterolemia 05/31/2019  . CAD in native artery 04/09/2019  . Complicated migraine 08/23/2018  . Hyperglycemia 07/10/2018  . Abnormal stress test 08/06/2016  . Essential hypertension 06/30/2016  .  Hypothyroidism 06/30/2016   Past Medical History:  Diagnosis Date  . Allergy   . Arthritis    back   . Atypical chest pain 06/30/2016  . Back pain    DUE TO INJURY AT WORK ON05/2013  . Bruises easily   . Chronic lower back pain   . Coronary artery disease   . Depression    was on meds 2 yrs ago   . Headache(784.0)    rarely  . History of bronchitis    last time about 44yrs ago   . Hypertension    takes Benicar daily  . Hypothyroidism 06/30/2016  . Pure hypercholesterolemia 05/31/2019  . Vitamin D deficiency    takes Vitamin D 2 times/wk   . Weakness    and numbness right foot    Family History  Problem Relation Age of Onset  . Heart disease Mother   . CAD Mother   . Stroke Mother   . Heart disease Sister   . Heart attack Sister   . Liver disease Brother   . Other Father        unknown medical history  . CAD Brother   . Colon cancer Neg Hx   . Esophageal cancer Neg Hx   . Rectal cancer Neg Hx   . Stomach cancer Neg Hx     Past Surgical History:  Procedure Laterality Date  . BACK SURGERY    . CARPAL TUNNEL RELEASE Right   . CESAREAN SECTION  1988  . COLONOSCOPY    . EXPLORATORY LAPAROTOMY  1991   "took out fatty tumor" (11/09/2012)  . IR ANGIO INTRA EXTRACRAN SEL COM CAROTID INNOMINATE BILAT MOD SED  08/10/2019  . IR ANGIO VERTEBRAL SEL VERTEBRAL BILAT MOD SED  08/10/2019  . IR US GUIDE VASC ACCESS RIGHT  08/10/2019  . LEFT HEART CATH AND CORONARY ANGIOGRAPHY N/A 08/06/2016   Procedure: Left Heart Cath and Coronary Angiography;  Surgeon: Lyn Records, MD;  Location: [facility] INVASIVE CV LAB;  Service: Cardiovascular;  Laterality: N/A;  . LEFT HEART CATH AND CORONARY ANGIOGRAPHY N/A 07/12/2018   Procedure: LEFT HEART CATH AND CORONARY ANGIOGRAPHY;  Surgeon: Marykay Lex, MD;  Location: [facility] INVASIVE CV LAB;  Service: Cardiovascular;  Laterality: N/A;  . LUMBAR LAMINECTOMY/DECOMPRESSION MICRODISCECTOMY  11/09/2012   "L3-4" (11/09/2012)  . LUMBAR LAMINECTOMY/DECOMPRESSION MICRODISCECTOMY N/A 11/09/2012   Procedure: L3-L4 DECOMPRESSION AND MICRODISCECTOMY   (1 LEVEL);  Surgeon: Venita Lick, MD;  Location: [facility] OR;  Service: Orthopedics;  Laterality: N/A;   Social History   Occupational History  . Occupation: Control and instrumentation engineer  Tobacco Use  . Smoking status: Never Smoker  . Smokeless tobacco: Never Used  Vaping Use  . Vaping Use: Never used  Substance and Sexual Activity  . Alcohol use: Yes    Alcohol/week: 7.0 standard drinks    Types: 7 Glasses of wine per week    Comment: social  . Drug use: No   . Sexual activity: Yes    Birth control/protection: Post-menopausal

## 2020-04-02 NOTE — Progress Notes (Signed)
Subjective: Patient is here for ultrasound-guided intra-articular left hip injection.   Prior injection helped for 4 months.  Can't have surgery yet due to A-fib.  Objective:  Pain with IR.  Procedure: Ultrasound guided injection is preferred based studies that show increased duration, increased effect, greater accuracy, decreased procedural pain, increased response rate, and decreased cost with ultrasound guided versus blind injection.   Verbal informed consent obtained.  Time-out conducted.  Noted no overlying erythema, induration, or other signs of local infection. Ultrasound-guided left hip injection: After sterile prep with Betadine, injected 4 cc 0.25% bupivacaine without epinephrine and 6 mg betamethasone using a 22-gauge spinal needle, passing the needle through the iliofemoral ligament into the femoral head/neck junction.  Injectate seen filling joint capsule.

## 2020-04-02 NOTE — Telephone Encounter (Signed)
The pt was asked what was the form needing to be filled out for the continuing disability form that she dropped off to be completed and the pt said that it for when she had her stroke and she is still unable to perform the job.

## 2020-04-03 NOTE — Telephone Encounter (Signed)
Spoke with patient regarding the nurse visit with EKG ordered by Edd Fabian, NP---scheduled Tuesday 04/15/20 at 12:15 pm.  Recall entered for 3 month follow up with Dr. Duke Salvia

## 2020-04-04 ENCOUNTER — Encounter: Payer: Self-pay | Admitting: Internal Medicine

## 2020-04-06 ENCOUNTER — Other Ambulatory Visit: Payer: Self-pay | Admitting: Internal Medicine

## 2020-04-07 ENCOUNTER — Telehealth: Payer: Self-pay

## 2020-04-07 NOTE — Telephone Encounter (Signed)
I left the pt a message that Dr. Allyne Gee wants to know the date the pt stopped working.

## 2020-04-08 ENCOUNTER — Ambulatory Visit: Payer: 59 | Admitting: Orthopaedic Surgery

## 2020-04-09 ENCOUNTER — Other Ambulatory Visit: Payer: Self-pay | Admitting: Family Medicine

## 2020-04-14 ENCOUNTER — Other Ambulatory Visit: Payer: Self-pay | Admitting: Family Medicine

## 2020-04-15 ENCOUNTER — Ambulatory Visit (INDEPENDENT_AMBULATORY_CARE_PROVIDER_SITE_OTHER): Payer: 59

## 2020-04-15 ENCOUNTER — Other Ambulatory Visit: Payer: Self-pay

## 2020-04-15 DIAGNOSIS — I4891 Unspecified atrial fibrillation: Secondary | ICD-10-CM

## 2020-04-15 NOTE — Patient Instructions (Addendum)
See old note from 04-15-20

## 2020-04-15 NOTE — Progress Notes (Signed)
Pt here for nurse visit for EKG for recent visit with Edd Fabian, FNP-C dated 03-26-2020. EKG taken and d/c normal EKG verified by Edd Fabian, FNP-C Pt left facility on own.

## 2020-04-16 ENCOUNTER — Telehealth: Payer: Self-pay | Admitting: Cardiovascular Disease

## 2020-04-16 NOTE — Telephone Encounter (Signed)
?*  STAT* If patient is at the pharmacy, call can be transferred to refill team. ? ? ?1. Which medications need to be refilled? (please list name of each medication and dose if known) (apixaban (ELIQUIS) 5 MG TABS tablet) ? ?2. Which pharmacy/location (including street and city if local pharmacy) is medication to be sent to? [id number] - , [place] - [address] ? ?3. Do they need a 30 day or 90 day supply? 30  ?

## 2020-04-17 MED ORDER — ELIQUIS 5 MG PO TABS: 1.0000 | ORAL_TABLET | Freq: Two times a day (BID) | ORAL | 1 refills | Status: DC

## 2020-04-17 NOTE — Telephone Encounter (Signed)
20f, 89.6kg, scr 0.87 03/18/20, lovw/cleaver 03/26/20

## 2020-04-23 NOTE — Progress Notes (Deleted)
HISTORY OF PRESENT ILLNESS: [name] is a 60 year old female, seen in request by her primary care physician Dr. Baird Cancer, Bailey Mech for episode of visual loss, headaches, initial evaluation was on August 23, 2018.  I have reviewed and summarized the referring note from the referring physician.  She had past medical history of hyperlipidemia, hypertension, coronary artery disease, status post stent on July 12, 2018, currently taking aspirin and Plavix  She denies a previous history of migraine headaches, on August 18, 2018, while sitting at the computer, she noticed difficulty reading, then bilateral vision went out, she was able to lie down with eyes closed resting for 30 minutes, her vision came back normal, at the same time she noticed mild left temporal region throbbing headaches, when she went back to look at computer again, she experienced visual loss bilaterally again, was sent to the emergency room  I personally reviewed MRI of the brain no acute abnormality  MRA of neck: 50% stenosis of left internal carotid artery, critical stenosis of proximal right internal carotid artery, estimated to be greater than 80% stenosis, 50% stenosis of the left internal carotid artery  MRA of the brain showed no significant large vessel stenosis intracranially  She experienced a severe headache on August 08/2018, severe holoacranial pounding headache with associated light noise sensitivity, nauseous that was helped by Tylenol  Laboratory evaluations in 2020, LDL 148, cholesterol 217, negative C-reactive protein, ESR was mildly elevated 50, UDS was negative, normal TSH, A1c 5.9  UPDATE June 21 2019: Her headache has much improved, tolerating Topamax 100 mg every night  She remained on aspirin and Plavix, she did not have ultrasound of carotid artery to follow-up her severe right carotid artery stenosis, more than 80% based on MRA in 2020  She also complains of low back pain, radiating pain to  left hip, left groin area  UPDATE Oct 18 2019: She is accompanied by her friends at today's clinical visit, we went over her medical history in detail  Ultrasound of carotid artery on June 27 2019 showed 80 to 99% stenosis of right internal carotid artery, left side was 40 to 59% stenosis  She was referred to vascular surgeon  X-ray of left hip showed end-stage degenerative changes of left hip, pending left hip replacement surgery  On August 07, 2019, she had severe headache on the right side, also describe left leg weakness.  MRI of the brain showed 2 subcentimeter foci of acute infarction in the right insular, affecting the cortex, also subarachnoid hemorrhage within the sulci on the right  Four-vessel angiogram showed occluded right internal carotid artery at the bulb, 30% stenosis of left internal carotid artery  Repeat MRI of the brain on September 06, 2019 showed subtle hemosiderin within the right frontal sulci, no acute abnormality  Laboratory evaluations in September 2021 showed normal B12, CMP, creatinine of 1.0, sodium of 133, normal CBC hemoglobin of 11.5, normal vitamin D 33.7, negative hepatitis C, A1c of 6.5,  Update April 24, 2020 SS:   REVIEW OF SYSTEMS: Out of a complete 14 system review of symptoms, the patient complains only of the following symptoms, and all other reviewed systems are negative.  As above ALLERGIES: Allergies  Allergen Reactions  . Other Itching and Other (See Comments)    Patient experienced intense itching in her arm that was accessed for a past angiogram    HOME MEDICATIONS: Outpatient Medications Prior to Visit  Medication Sig Dispense Refill  . acetaminophen (TYLENOL) 500 MG  tablet Take 500-1,000 mg by mouth every 6 (six) hours as needed (pain or headaches).    Marland Kitchen apixaban (ELIQUIS) 5 MG TABS tablet Take 1 tablet (5 mg total) by mouth 2 (two) times daily. 180 tablet 1  . atorvastatin (LIPITOR) 80 MG tablet Take 1 tablet (80 mg total) by mouth  daily at 6 PM. 90 tablet 2  . cholecalciferol (VITAMIN D3) 25 MCG (1000 UNIT) tablet Take 1,000 Units by mouth daily.     Marland Kitchen diltiazem (CARDIZEM CD) 120 MG 24 hr capsule TAKE 1 CAPSULE BY MOUTH EVERY DAY 90 capsule 1  . ezetimibe (ZETIA) 10 MG tablet TAKE 1 TABLET BY MOUTH EVERY DAY 90 tablet 3  . fluticasone (FLONASE) 50 MCG/ACT nasal spray PLACE 2 SPRAYS INTO BOTH NOSTRILS DAILY AS NEEDED FOR ALLERGIES OR RHINITIS. 16 mL 1  . hydrOXYzine (ATARAX/VISTARIL) 25 MG tablet Take 1 tablet (25 mg total) by mouth 3 (three) times daily as needed. 30 tablet 0  . levocetirizine (XYZAL) 5 MG tablet Take 1 tablet (5 mg total) by mouth daily. 90 tablet 1  . Magnesium 250 MG TABS TAKE 1 TABLET BY MOUTH DAILY WITH EVENING MEALS 90 tablet 2  . Olmesartan-amLODIPine-HCTZ 20-5-12.5 MG TABS TAKE 1 TABLET BY MOUTH EVERY DAY 90 tablet 1  . tiZANidine (ZANAFLEX) 4 MG tablet Take 1 tablet (4 mg total) by mouth every 8 (eight) hours as needed for muscle spasms. 30 tablet 0  . topiramate (TOPAMAX) 100 MG tablet Take 1 tablet (100 mg total) by mouth at bedtime. 90 tablet 4   No facility-administered medications prior to visit.    PAST MEDICAL HISTORY: Past Medical History:  Diagnosis Date  . Allergy   . Arthritis    back   . Atypical chest pain 06/30/2016  . Back pain    DUE TO INJURY AT WORK ON05/2013  . Bruises easily   . Chronic lower back pain   . Coronary artery disease   . Depression    was on meds 2 yrs ago   . Headache(784.0)    rarely  . History of bronchitis    last time about 3yr ago   . Hypertension    takes Benicar daily  . Hypothyroidism 06/30/2016  . Pure hypercholesterolemia 05/31/2019  . Vitamin D deficiency    takes Vitamin D 2 times/wk  . Weakness    and numbness right foot    PAST SURGICAL HISTORY: Past Surgical History:  Procedure Laterality Date  . BACK SURGERY    . CARPAL TUNNEL RELEASE Right   . CESAREAN SECTION  1988  . COLONOSCOPY    . EXPLORATORY LAPAROTOMY  1991    "took out fatty tumor" (11/09/2012)  . IR ANGIO INTRA EXTRACRAN SEL COM CAROTID INNOMINATE BILAT MOD SED  08/10/2019  . IR ANGIO VERTEBRAL SEL VERTEBRAL BILAT MOD SED  08/10/2019  . IR [place] VASC ACCESS RIGHT  08/10/2019  . LEFT HEART CATH AND CORONARY ANGIOGRAPHY N/A 08/06/2016   Procedure: Left Heart Cath and Coronary Angiography;  Surgeon: SBelva Crome MD;  [facility];  Service: Cardiovascular;  Laterality: N/A;  . LEFT HEART CATH AND CORONARY ANGIOGRAPHY N/A 07/12/2018   Procedure: LEFT HEART CATH AND CORONARY ANGIOGRAPHY;  Surgeon: HLeonie Man MD;  [facility];  Service: Cardiovascular;  Laterality: N/A;  . LUMBAR LAMINECTOMY/DECOMPRESSION MICRODISCECTOMY  11/09/2012   "L3-4" (11/09/2012)  . LUMBAR LAMINECTOMY/DECOMPRESSION MICRODISCECTOMY N/A 11/09/2012   Procedure: L3-L4 DECOMPRESSION AND MICRODISCECTOMY   (  1 LEVEL);  Surgeon: Melina Schools, MD;  Location: [facility];  Service: Orthopedics;  Laterality: N/A;    FAMILY HISTORY: Family History  Problem Relation Age of Onset  . Heart disease Mother   . CAD Mother   . Stroke Mother   . Heart disease Sister   . Heart attack Sister   . Liver disease Brother   . Other Father        unknown medical history  . CAD Brother   . Colon cancer Neg Hx   . Esophageal cancer Neg Hx   . Rectal cancer Neg Hx   . Stomach cancer Neg Hx     SOCIAL HISTORY: Social History   Socioeconomic History  . Marital status: Divorced    Spouse name: Not on file  . Number of children: 1  . Years of education: Not on file  . Highest education level: Some college, no degree  Occupational History  . Occupation: Sports administrator  Tobacco Use  . Smoking status: Never Smoker  . Smokeless tobacco: Never Used  Vaping Use  . Vaping Use: Never used  Substance and Sexual Activity  . Alcohol use: Yes    Alcohol/week: 7.0 standard drinks    Types: 7 Glasses of wine per week    Comment: social  . Drug use:  No  . Sexual activity: Yes    Birth control/protection: Post-menopausal  Other Topics Concern  . Not on file  Social History Narrative   Right-handed.   Occasional caffeine.   Lives alone.   Social Determinants of Health   Financial Resource Strain: Not on file  Food Insecurity: Not on file  Transportation Needs: Not on file  Physical Activity: Not on file  Stress: Not on file  Social Connections: Not on file  Intimate Partner Violence: Not on file    PHYSICAL EXAM  There were no vitals filed for this visit. There is no height or weight on file to calculate BMI.   PHYSICAL EXAMNIATION:  Gen: NAD, conversant, well nourised, well groomed                     Cardiovascular: Regular rate rhythm, no peripheral edema, warm, nontender. Eyes: Conjunctivae clear without exudates or hemorrhage Neck: Supple, no carotid bruits. Pulmonary: Clear to auscultation bilaterally   NEUROLOGICAL EXAM:  MENTAL STATUS: Speech/Cognition: Awake, alert, normal speech, oriented to history taking and casual conversation.  CRANIAL NERVES: CN II: Visual fields are full to confrontation.  Pupils are round equal and briskly reactive to light. CN III, IV, VI: extraocular movement are normal. No ptosis. CN V: Facial sensation is intact to light touch. CN VII: Face is symmetric with normal eye closure and smile. CN VIII: Hearing is normal to casual conversation CN IX, X: Palate elevates symmetrically. Phonation is normal. CN XI: Head turning and shoulder shrug are intact CN XII: Tongue is midline with normal movements and no atrophy.  MOTOR: Muscle bulk and tone are normal. Muscle strength is normal.  REFLEXES: Reflexes are 2  and symmetric at the biceps, triceps, knees and ankles. Plantar responses are flexor.  SENSORY: Intact to light touch, pinprick, positional and vibratory sensation at fingers and toes.  COORDINATION: There is no trunk or limb ataxia.    GAIT/STANCE: Need push-up to  get up from seated position, dragging her left leg,  DIAGNOSTIC DATA (LABS, IMAGING, TESTING) - I reviewed patient records, labs, notes, testing and imaging myself where available.  Lab Results  Component Value Date   WBC 8.5 03/18/2020   HGB 11.0 (L) 03/18/2020   HCT 35.2 (L) 03/18/2020   MCV 92.9 03/18/2020   PLT 247 03/18/2020      Component Value Date/Time   NA 139 03/18/2020 0205   NA 142 03/11/2020 1655   K 3.7 03/18/2020 0205   CL 109 03/18/2020 0205   CO2 22 03/18/2020 0205   GLUCOSE 112 (H) 03/18/2020 0205   BUN 14 03/18/2020 0205   BUN 15 03/11/2020 1655   CREATININE 0.87 03/18/2020 0205   CALCIUM 9.2 03/18/2020 0205   PROT 7.6 03/11/2020 1655   ALBUMIN 4.7 03/11/2020 1655   AST 19 03/11/2020 1655   ALT 21 03/11/2020 1655   ALKPHOS 87 03/11/2020 1655   BILITOT 0.6 03/11/2020 1655   GFRNONAA >60 03/18/2020 0205   GFRAA 70 09/18/2019 1622   Lab Results  Component Value Date   CHOL 90 03/17/2020   HDL 41 03/17/2020   LDLCALC 41 03/17/2020   TRIG 42 03/17/2020   CHOLHDL 2.2 03/17/2020   Lab Results  Component Value Date   HGBA1C 6.3 (H) 03/11/2020   Lab Results  Component Value Date   PPNDLOPR67 425 09/18/2019   Lab Results  Component Value Date   TSH 1.978 03/17/2020      ASSESSMENT AND PLAN 60 y.o. year old female  Stroke Bilateral carotid artery stenosis Total occlusion of right internal carotid artery History of right MCA stroke also subarachnoid hemorrhage in the setting of aspirin and Plavix  She is on aspirin 81 mg along I also emphasized importance of increased water intake  Headache with migraine features Much improved, Keep Topamax 100 mg at bedtime [place] as needed  Left hip end-stage degenerative disease, pending left hip replacement  Marcial Pacas, M.D. Ph.D.  [facility] [place], [place] [address] Phone: [phone] Fax:      [phone]

## 2020-04-24 ENCOUNTER — Ambulatory Visit: Payer: 59 | Admitting: Neurology

## 2020-04-28 ENCOUNTER — Ambulatory Visit (INDEPENDENT_AMBULATORY_CARE_PROVIDER_SITE_OTHER): Payer: 59 | Admitting: Neurology

## 2020-04-28 ENCOUNTER — Encounter: Payer: Self-pay | Admitting: Neurology

## 2020-04-28 VITALS — BP 133/73 | HR 85 | Ht 61.0 in | Wt 197.0 lb

## 2020-04-28 DIAGNOSIS — R0683 Snoring: Secondary | ICD-10-CM

## 2020-04-28 DIAGNOSIS — G43109 Migraine with aura, not intractable, without status migrainosus: Secondary | ICD-10-CM

## 2020-04-28 DIAGNOSIS — G4733 Obstructive sleep apnea (adult) (pediatric): Secondary | ICD-10-CM | POA: Insufficient documentation

## 2020-04-28 DIAGNOSIS — I480 Paroxysmal atrial fibrillation: Secondary | ICD-10-CM | POA: Diagnosis not present

## 2020-04-28 DIAGNOSIS — I639 Cerebral infarction, unspecified: Secondary | ICD-10-CM

## 2020-04-28 DIAGNOSIS — I4891 Unspecified atrial fibrillation: Secondary | ICD-10-CM

## 2020-04-28 DIAGNOSIS — I609 Nontraumatic subarachnoid hemorrhage, unspecified: Secondary | ICD-10-CM

## 2020-04-28 DIAGNOSIS — I6523 Occlusion and stenosis of bilateral carotid arteries: Secondary | ICD-10-CM

## 2020-04-28 HISTORY — DX: Obstructive sleep apnea (adult) (pediatric): G47.33

## 2020-04-28 NOTE — Patient Instructions (Signed)
Atrial Fibrillation  Atrial fibrillation is a type of irregular or rapid heartbeat (arrhythmia). In atrial fibrillation, the top part of the heart (atria) beats in an irregular pattern. This makes the heart unable to pump blood normally and effectively. The goal of treatment is to prevent blood clots from forming, control your heart rate, or restore your heartbeat to a normal rhythm. If this condition is not treated, it can cause serious problems, such as a weakened heart muscle (cardiomyopathy) or a stroke. What are the causes? This condition is often caused by medical conditions that damage the heart's electrical system. These include:  High blood pressure (hypertension). This is the most common cause.  Certain heart problems or conditions, such as heart failure, coronary artery disease, heart valve problems, or heart surgery.  Diabetes.  Overactive thyroid (hyperthyroidism).  Obesity.  Chronic kidney disease. In some cases, the cause of this condition is not known. What increases the risk? This condition is more likely to develop in:  Older people.  People who smoke.  Athletes who do endurance exercise.  People who have a family history of atrial fibrillation.  Men.  People who use drugs.  People who drink a lot of alcohol.  People who have lung conditions, such as emphysema, pneumonia, or COPD.  People who have obstructive sleep apnea. What are the signs or symptoms? Symptoms of this condition include:  A feeling that your heart is racing or beating irregularly.  Discomfort or pain in your chest.  Shortness of breath.  Sudden light-headedness or weakness.  Tiring easily during exercise or activity.  Fatigue.  Syncope (fainting).  Sweating. In some cases, there are no symptoms. How is this diagnosed? Your health care provider may detect atrial fibrillation when taking your pulse. If detected, this condition may be diagnosed with:  An electrocardiogram  (ECG) to check electrical signals of the heart.  An ambulatory cardiac monitor to record your heart's activity for a few days.  A transthoracic echocardiogram (TTE) to create pictures of your heart.  A transesophageal echocardiogram (TEE) to create even closer pictures of your heart.  A stress test to check your blood supply while you exercise.  Imaging tests, such as a CT scan or chest X-ray.  Blood tests. How is this treated? Treatment depends on underlying conditions and how you feel when you experience atrial fibrillation. This condition may be treated with:  Medicines to prevent blood clots or to treat heart rate or heart rhythm problems.  Electrical cardioversion to reset the heart's rhythm.  A pacemaker to correct abnormal heart rhythm.  Ablation to remove the heart tissue that sends abnormal signals.  Left atrial appendage closure to seal the area where blood clots can form. In some cases, underlying conditions will be treated. Follow these instructions at home: Medicines  Take over-the counter and prescription medicines only as told by your health care provider.  Do not take any new medicines without talking to your health care provider.  If you are taking blood thinners: ? Talk with your health care provider before you take any medicines that contain aspirin or NSAIDs, such as ibuprofen. These medicines increase your risk for dangerous bleeding. ? Take your medicine exactly as told, at the same time every day. ? Avoid activities that could cause injury or bruising, and follow instructions about how to prevent falls. ? Wear a medical alert bracelet or carry a card that lists what medicines you take. Lifestyle  Do not use any products that contain nicotine or  tobacco, such as cigarettes, e-cigarettes, and chewing tobacco. If you need help quitting, ask your health care provider.  Eat heart-healthy foods. Talk with a dietitian to make an eating plan that is right for  you.  Exercise regularly as told by your health care provider.  Do not drink alcohol.  Lose weight if you are overweight.  Do not use drugs, including cannabis.      General instructions  If you have obstructive sleep apnea, manage your condition as told by your health care provider.  Do not use diet pills unless your health care provider approves. Diet pills can make heart problems worse.  Keep all follow-up visits as told by your health care provider. This is important. Contact a health care provider if you:  Notice a change in the rate, rhythm, or strength of your heartbeat.  Are taking a blood thinner and you notice more bruising.  Tire more easily when you exercise or do heavy work.  Have a sudden change in weight. Get help right away if you have:  Chest pain, abdominal pain, sweating, or weakness.  Trouble breathing.  Side effects of blood thinners, such as blood in your vomit, stool, or urine, or bleeding that cannot stop.  Any symptoms of a stroke. "BE FAST" is an easy way to remember the main warning signs of a stroke: ? B - Balance. Signs are dizziness, sudden trouble walking, or loss of balance. ? E - Eyes. Signs are trouble seeing or a sudden change in vision. ? F - Face. Signs are sudden weakness or numbness of the face, or the face or eyelid drooping on one side. ? A - Arms. Signs are weakness or numbness in an arm. This happens suddenly and usually on one side of the body. ? S - Speech. Signs are sudden trouble speaking, slurred speech, or trouble understanding what people say. ? T - Time. Time to call emergency services. Write down what time symptoms started.  Other signs of a stroke, such as: ? A sudden, severe headache with no known cause. ? Nausea or vomiting. ? Seizure. These symptoms may represent a serious problem that is an emergency. Do not wait to see if the symptoms will go away. Get medical help right away. Call your local emergency services  (911 in the [place] Do not drive yourself to the hospital.   Summary  Atrial fibrillation is a type of irregular or rapid heartbeat (arrhythmia).  Symptoms include a feeling that your heart is beating fast or irregularly.  You may be given medicines to prevent blood clots or to treat heart rate or heart rhythm problems.  Get help right away if you have signs or symptoms of a stroke.  Get help right away if you cannot catch your breath or have chest pain or pressure. This information is not intended to replace advice given to you by your health care provider. Make sure you discuss any questions you have with your health care provider. Document Revised: 06/21/2018 Document Reviewed: 06/21/2018 Elsevier Patient Education  2021 [employer] Screening for Sleep Apnea  Sleep apnea is a condition in which breathing pauses or becomes shallow during sleep. Sleep apnea screening is a test to determine if you are at risk for sleep apnea. The test is easy and only takes a few minutes. Your health care provider may ask you to have this test in preparation for surgery or as part of a physical exam. What are the symptoms of sleep apnea? Common symptoms  of sleep apnea include:  Snoring.  Restless sleep.  Daytime sleepiness.  Pauses in breathing.  Choking during sleep.  Irritability.  Forgetfulness.  Trouble thinking clearly.  Depression.  Personality changes. Most people with sleep apnea are not aware that they have it. Why should I get screened? Getting screened for sleep apnea can help:  Ensure your safety. It is important for your health care providers to know whether or not you have sleep apnea, especially if you are having surgery or have other long-term (chronic) health conditions.  Improve your health and allow you to get a better night's rest. Restful sleep can help you: ? Have more energy. ? Lose weight. ? Improve high blood pressure. ? Improve diabetes  management. ? Prevent stroke. ? Prevent car accidents. How is screening done? Screening usually includes being asked a list of questions about your sleep quality. Some questions you may be asked include:  Do you snore?  Is your sleep restless?  Do you have daytime sleepiness?  Has a partner or spouse told you that you stop breathing during sleep?  Have you had trouble concentrating or memory loss? If your screening test is positive, you are at risk for the condition. Further testing may be needed to confirm a diagnosis of sleep apnea. Where to find more information You can find screening tools online or at your health care clinic. For more information about sleep apnea screening and healthy sleep, visit these websites:  Centers for Disease Control and Prevention: DetailSports.is  American Sleep Apnea Association: www.sleepapnea.org Contact a health care provider if:  You think that you may have sleep apnea. Summary  Sleep apnea screening can help determine if you are at risk for sleep apnea.  It is important for your health care providers to know whether or not you have sleep apnea, especially if you are having surgery or have other chronic health conditions.  You may be asked to take a screening test for sleep apnea in preparation for surgery or as part of a physical exam. This information is not intended to replace advice given to you by your health care provider. Make sure you discuss any questions you have with your health care provider. Document Revised: 10/14/2017 Document Reviewed: 04/09/2016 Elsevier Patient Education  2021 [employer].

## 2020-04-28 NOTE — Progress Notes (Signed)
SLEEP MEDICINE CLINIC    Provider:  Larey Seat, MD  Primary Care Physician:  Glendale Chard, MD [address] [place] [address]     Referring Provider: Skeet Latch, MD,  [facility]   Dr. Marcial Pacas is her primary ( and stroke) neurologist.  The patient is seen here in sleep medicine consultation.            Chief Complaint according to patient   Patient presents with:    . New Patient (Initial Visit)           HISTORY OF PRESENT ILLNESS:  [name] is a 60 year- old  African American female patient and seen here upon referral on 04/28/2020 for a sleep medicine consultation- the patient has a primary neurologist, Dr. Krista Blue, who followed her for a stroke in 2021.   Chief concern according to patient :   Presents today for sleep eval. Had a PSG and [facility]  in 2014 and was dx with OSA. Started CPAP 01/23/13 and used only a short amt of time.  She recently had a SAH and a stroke and dx with A Fib ( MARCH 2022)  and Dr Oval Linsey had requested her to readdress OSA.   [name]  has a past medical history of Allergy, Arthritis, Atypical chest pain (06/30/2016), Back pain, Bruises easily, Chronic lower back pain, STROKE- SAH ( Dr Krista Blue ) , Migraines, untreated Obstructive Sleep Apnea ( Dr Claiborne Billings) , known Coronary artery disease, Depression, Headache, History of bronchitis, Hypertension, Hypothyroidism (06/30/2016), Pure hypercholesterolemia (05/31/2019), Vitamin D deficiency. [name]<MD : The patient is referred today on 04-28-2020 based on that home health care note which was a virtual visit by telephone on 3-16 2022 stating that the patient has a history of essential hypertension bilateral carotid artery stenosis, acute CVA, atrial fibrillation with rapid ventricular response, pulmonary hypertension, subarachnoid hemorrhage, hyperlipidemia, hypokalemia.  She presented to the emergency department on 03/16/2020 was  discharged on 3 8 she had increased chest pressure upon arrival was found to be in atrial fibrillation with RVP had noted increased shortness of breath blood pressure was 140s over 100 but the heart rate was 153 bpm.  Troponins were normal range, she was started on Cardizem and a bolus with normal saline then was transitioned to diltiazem she was evaluated by Dr. Skeet Latch.  She had stated that she drinks caffeinated beverages.  She transitioned from atrial fibrillation to normal sinus rhythm, echocardiogram was performed and showed a preserved ejection fraction.  Heparin was transitioned to Eliquis.  And she reported virtually in her visit that she was feeling much better she had changed from a more stressful job to a less stressful job in the meantime and this also allowed her to be more relaxed and less likely to be under as much pressure.  She was asked to increase her physical activity avoid triggers and also asked to readdress her sleep apnea.    The patient had the first sleep study in the year 2014  with a result of an AHI ( Apnea Hypopnea index)  of the study was from 12-16 2014, at the time the BMI was 29 the age of the patient was 52 sleep onset was recorded at 9 minutes, REM latency was 77 minutes, heart rate varied between 89 and 63 bpm which is not abnormal, there were few periodic limb movements.  It is remarkable that I do not see  the baseline AHI it was diagnosed at 9.4 overall and at 18.5 during REM sleep.  So this was not a baseline study the 12/26/2012 study was a CPAP titration.  This patient had mild apnea and it was CPAP that was titrated to 4 cm and increased to 8 cmH2O a CPAP was ordered for that setting of 8 cmH2O- actually,  no mask was recommended.  HISTORY OF PRESENT ILLNESS: [name] a 60 year old female, seen in request byher primary care physician Dr.Sanders, Robynfor episode of visual loss, headaches, initial evaluation was on August 23, 2018.  I have  reviewed and summarized the referring note from the referring physician.She had past medical history of hyperlipidemia, hypertension, coronary artery disease, status post stent on July 12, 2018, currently taking aspirin and Plavix  She denies a previous history of migraine headaches, on August 18, 2018, while sitting at the computer, she noticed difficulty reading, then bilateral vision went out, she was able to lie down with eyes closed resting for 30 minutes, her vision came back normal, at the same time she noticed mild left temporal region throbbing headaches, when she went back to look at computer again, she experienced visual loss bilaterally again, was sent to the emergency room  I personally reviewed MRI of the brain no acute abnormality  MRA of neck: 50% stenosis of left internal carotid artery, critical stenosis of proximal right internal carotid artery,estimated to be greater than 80% stenosis, 50% stenosis of the left internal carotid artery  MRA of the brain showed no significant large vessel stenosis intracranially She experienced a severe headache on August8/2020, severe holoacranial pounding headache with associated light noise sensitivity, nauseous that was helped by Tylenol Laboratory evaluations in 2020, LDL 148, cholesterol 217, negative C-reactive protein, ESR was mildly elevated 50, UDS was negative, normal TSH, A1c 5.9   Dr Krista Blue : June 21 2019: Her headache has much improved, tolerating Topamax 100 mg every night She remained on aspirin and Plavix, she did not have ultrasound of carotid artery to follow-up her severe right carotid artery stenosis, more than 80% based on MRA in 2020     Family medical /sleep history: No other family member on CPAP with OSA, insomnia, sleep walkers.    Social history: Patient is working at [employer] and recently switched jobs to social services and less under stress.  She  lives in a household  Alone, not pets, adult child , 3  grandchildren.  The patient currently daytime -works regular office hours.  Tobacco use/.  ETOH use : wine 1-2  At night,  Caffeine intake in form of Coffee( / Soda( /) Tea (/) no energy drinks. Regular exercise in form of walking.    Sleep habits are as follows: The patient's dinner time is between 5-6.30 PM.  The patient goes to bed at 10 PM and continues to sleep for 3-4 hours, wakes for 1-2 bathroom breaks, gets a total of 7 hours of sleep. .   The preferred sleep position is variable - restless , due to hip pain- with the support of 1-2 pillows.  Dreams are reportedly infrequent/vivid.  7.30  AM is the usual rise time. The patient wakes up with an alarm set at 7 AM .  She reports not feeling refreshed or restored in AM, with symptoms such as dry mouth, no morning headaches , no night time headaches  But some residual fatigue. Naps are taken infrequently.  Review of Systems: Out of a complete 14 system review,  the patient complains of only the following symptoms, and all other reviewed systems are negative.:  Fatigue, migraines.  Daytime sleepiness , loud snoring, fragmented sleep by nocturia.  DENIES SLEEPINESS< has had SAH, CVA and has paroxysmal atrial fib with RVR.    How likely are you to doze in the following situations: 0 = not likely, 1 = slight chance, 2 = moderate chance, 3 = high chance   Sitting and Reading? Watching Television? Sitting inactive in a public place (theater or meeting)? As a passenger in a car for an hour without a break? Lying down in the afternoon when circumstances permit? Sitting and talking to someone? Sitting quietly after lunch without alcohol? In a car, while stopped for a few minutes in traffic?   Total = 0/ 24 points   FSS endorsed at 27/ 63 points.   Social History   Socioeconomic History  . Marital status: Divorced    Spouse name: Not on file  . Number of children: 1  . Years of education: Not on file  . Highest education level: Some  college, no degree  Occupational History  . 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 Smoking status: Never Smoker  . Smokeless tobacco: Never Used  Vaping Use  . Vaping Use: Never used  Substance and Sexual Activity  . Alcohol use: Yes    Alcohol/week: 7.0 standard drinks    Types: 7 Glasses of wine per week    Comment: social  . Drug use: No  . Sexual activity: Yes    Birth control/protection: Post-menopausal  Other Topics Concern  . Not on file  Social History Narrative   Right-handed.   Occasional caffeine.   Lives alone.   Social Determinants of Health   Financial Resource Strain: Not on file  Food Insecurity: Not on file  Transportation Needs: Not on file  Physical Activity: Not possible with hip pain.   Stress: less since change of jobs in 03-2020  Social Connections: son and 3 grandchildren live near     Family History  Problem Relation Age of Onset  . Heart disease Mother   . CAD Mother   . Stroke Mother   . Heart disease Sister   . Heart attack Sister   . Liver disease Brother   . Other Father        unknown medical history  . CAD Brother   . Colon cancer Neg Hx   . Esophageal cancer Neg Hx   . Rectal cancer Neg Hx   . Stomach cancer Neg Hx     Past Medical History:  Diagnosis Date  . Allergy   . Arthritis    back   . Atypical chest pain 06/30/2016  . Back pain    DUE TO INJURY AT WORK ON05/2013  . Bruises easily   . Chronic lower back pain   . Coronary artery disease   . Depression    was on meds 2 yrs ago   . Headache(784.0)    rarely  . History of bronchitis    last time about 4yrs ago   . Hypertension    takes Benicar daily  . Hypothyroidism 06/30/2016  . Pure hypercholesterolemia 05/31/2019  . Vitamin D deficiency    takes Vitamin D 2 times/wk  . Weakness    and numbness right foot    Past Surgical History:  Procedure Laterality Date  . BACK SURGERY    . CARPAL TUNNEL RELEASE Right   . CESAREAN   SECTION  1988   . COLONOSCOPY    . EXPLORATORY LAPAROTOMY  1991   "took out fatty tumor" (11/09/2012)  . IR ANGIO INTRA EXTRACRAN SEL COM CAROTID INNOMINATE BILAT MOD SED  08/10/2019  . IR ANGIO VERTEBRAL SEL VERTEBRAL BILAT MOD SED  08/10/2019  . IR US GUIDE VASC ACCESS RIGHT  08/10/2019  . LEFT HEART CATH AND CORONARY ANGIOGRAPHY N/A 08/06/2016   Procedure: Left Heart Cath and Coronary Angiography;  Surgeon: Smith, Henry W, MD;  Location: [facility];  Service: Cardiovascular;  Laterality: N/A;  . LEFT HEART CATH AND CORONARY ANGIOGRAPHY N/A 07/12/2018   Procedure: LEFT HEART CATH AND CORONARY ANGIOGRAPHY;  Surgeon: Harding, David W, MD;  Location: [facility];  Service: Cardiovascular;  Laterality: N/A;  . LUMBAR LAMINECTOMY/DECOMPRESSION MICRODISCECTOMY  11/09/2012   "L3-4" (11/09/2012)  . LUMBAR LAMINECTOMY/DECOMPRESSION MICRODISCECTOMY N/A 11/09/2012   Procedure: L3-L4 DECOMPRESSION AND MICRODISCECTOMY   (1 LEVEL);  Surgeon: Dahari Brooks, MD;  Location: [facility] OR;  Service: Orthopedics;  Laterality: N/A;     Current Outpatient Medications on File Prior to Visit  Medication Sig Dispense Refill  . acetaminophen (TYLENOL) 500 MG tablet Take 500-1,000 mg by mouth every 6 (six) hours as needed (pain or headaches).    . apixaban (ELIQUIS) 5 MG TABS tablet Take 1 tablet (5 mg total) by mouth 2 (two) times daily. 180 tablet 1  . atorvastatin (LIPITOR) 80 MG tablet Take 1 tablet (80 mg total) by mouth daily at 6 PM. 90 tablet 2  . cholecalciferol (VITAMIN D3) 25 MCG (1000 UNIT) tablet Take 1,000 Units by mouth daily.     . diltiazem (CARDIZEM CD) 120 MG 24 hr capsule TAKE 1 CAPSULE BY MOUTH EVERY DAY 90 capsule 1  . ezetimibe (ZETIA) 10 MG tablet TAKE 1 TABLET BY MOUTH EVERY DAY 90 tablet 3  . fluticasone (FLONASE) 50 MCG/ACT nasal spray PLACE 2 SPRAYS INTO BOTH NOSTRILS DAILY AS NEEDED FOR ALLERGIES OR RHINITIS. 16 mL 1  . hydrOXYzine (ATARAX/VISTARIL) 25 MG tablet Take 1 tablet (25 mg total) by mouth  3 (three) times daily as needed. 30 tablet 0  . levocetirizine (XYZAL) 5 MG tablet Take 1 tablet (5 mg total) by mouth daily. 90 tablet 1  . Magnesium 250 MG TABS TAKE 1 TABLET BY MOUTH DAILY WITH EVENING MEALS 90 tablet 2  . Olmesartan-amLODIPine-HCTZ 20-5-12.5 MG TABS TAKE 1 TABLET BY MOUTH EVERY DAY 90 tablet 1  . tiZANidine (ZANAFLEX) 4 MG tablet Take 1 tablet (4 mg total) by mouth every 8 (eight) hours as needed for muscle spasms. 30 tablet 0  . topiramate (TOPAMAX) 100 MG tablet Take 1 tablet (100 mg total) by mouth at bedtime. 90 tablet 4   No current facility-administered medications on file prior to visit.    Allergies  Allergen Reactions  . Other Itching and Other (See Comments)    Patient experienced intense itching in her arm that was accessed for a past angiogram    Physical exam:  Today's Vitals   04/28/20 1520  BP: 133/73  Pulse: 85  Weight: 197 lb (89.4 kg)  Height: 5' 1" (1.549 m)   Body mass index is 37.22 kg/m.   Wt Readings from Last 3 Encounters:  04/28/20 197 lb (89.4 kg)  03/26/20 192 lb (87.1 kg)  03/20/20 197 lb 9.6 oz (89.6 kg)     Ht Readings from Last 3 Encounters:  04/28/20 5' 1" (1.549 m)  03/26/20 5' 1" (1.549 m)  03/20/20   5' 1" (1.549 m)      General: The patient is awake, alert and appears not in acute distress.  The patient is well groomed. Head: Normocephalic, atraumatic. Neck is supple. Mallampati 2,  neck circumference:16.5 inches . Nasal airflow patent.  Retrognathia is mild.  Dental status: partial dentures.  Cardiovascular:  Regular rate and cardiac rhythm by pulse,  without distended neck veins. Respiratory: Lungs are clear to auscultation.  Skin:  Without evidence of ankle edema, or rash. Trunk: The patient's posture is erect.   Neurologic exam : The patient is awake and alert, oriented to place and time.   Memory subjective described as intact.  Attention span & concentration ability appears normal.  Speech is fluent,   without  dysarthria, dysphonia or aphasia.  Mood and affect are appropriate.   Cranial nerves: no loss of smell or taste reported  Pupils are equal and briskly reactive to light. Funduscopic exam deferred. .  Extraocular movements in vertical and horizontal planes were intact and without nystagmus.  No Diplopia. Visual fields by finger perimetry are intact. Hearing was intact to soft voice and finger rubbing.   Facial sensation intact to fine touch.  Facial motor strength is symmetric and tongue and uvula move midline.  Neck ROM : rotation, tilt and flexion extension were normal for age and shoulder shrug was symmetrical.    Motor exam:  Symmetric bulk, tone and ROM.   Normal tone without cog -wheeling, symmetric grip strength .   Sensory:  Fine touch and vibration were tested  and  normal.  Proprioception tested in the upper extremities was normal.   Coordination: Rapid alternating movements in the fingers/Wagner were of normal speed. Normal penmanship-  The Finger-to-nose maneuver was intact .   Gait and station: Patient could rise unassisted from a seated position, walked without assistive device.   Toe and heel walk were deferred.  Deep tendon reflexes: in the  upper and lower extremities are symmetric and level 1  Babinski response was deferred.      After spending a total time of  46 minutes face to face and additional time for physical and neurologic examination, review of laboratory studies,  personal review of imaging studies, reports and results of other testing and review of referral information / records as far as provided in visit, I have established the following assessments:  59 y.o. year old AA female with history of 07-2019 CVA, Stroke, and now a new ( 03-25-2020) SAH after antiplatelet therapy.  In the setting of Bilateral carotid artery stenosis/Total occlusion of right internal carotid artery History of right MCA stroke also subarachnoid hemorrhage in the setting of  aspirin and Plavix  She is anticoagulated on Eloquis.  Atrial fibrillation with RVR was found in 03-25-2020, Dr [place] did Echo- normal EF.  Atrial fib may have been caffeine induced   Headache with migraine features/ Much improved, not affecting sleep .Dr Yan advised to hydrate well and keep taking Topamax 100 mg at bedtime    My Plan is to proceed with:  1) I will order a sleep study with cardiac rhythm observation, this would be an attended sleep study or this patient with recent SAH and atrial fib onset.  2) if not permitted by insurance , will do HST and follow with autotitration. Unfortunately, that would not bode well for future compliance. I need this patient to be coached, her mask to be fitted ,   3) lower caffeine, alcohol and cholesterol intake.   I   would like to thank Tiffany Strawberry, MD @ [facility].  PCP : Sanders, Robyn, Md [address] [place],  [place] [address] for allowing me to meet with and to take care of this pleasant patient.   In short, Aleese R Clink is presenting with new onset atrial fib.  I plan to follow up either personally or through our NP within 3 month.   CC: I will share my notes with PCP and primary neurologist. .  Electronically signed by: Carmen Dohmeier, MD 04/28/2020 3:24 PM  [facility] and [facility] certified by The American Board of Sleep Medicine and Diplomate of the American Academy of Sleep Medicine. Board certified In Neurology through the ABPN, Fellow of the American Academy of Neurology. Medical Director of [facility].    

## 2020-04-30 ENCOUNTER — Institutional Professional Consult (permissible substitution): Payer: 59 | Admitting: Neurology

## 2020-05-01 ENCOUNTER — Other Ambulatory Visit: Payer: Self-pay | Admitting: Internal Medicine

## 2020-05-14 ENCOUNTER — Other Ambulatory Visit: Payer: Self-pay | Admitting: Internal Medicine

## 2020-05-25 ENCOUNTER — Other Ambulatory Visit: Payer: Self-pay | Admitting: Internal Medicine

## 2020-05-28 ENCOUNTER — Telehealth: Payer: Self-pay | Admitting: Cardiovascular Disease

## 2020-05-28 DIAGNOSIS — Z5181 Encounter for therapeutic drug level monitoring: Secondary | ICD-10-CM

## 2020-05-28 DIAGNOSIS — I10 Essential (primary) hypertension: Secondary | ICD-10-CM

## 2020-05-28 DIAGNOSIS — R600 Localized edema: Secondary | ICD-10-CM

## 2020-05-28 NOTE — Telephone Encounter (Signed)
Returned the call to patient to discuss her swelling. She stated that for the past week she has been having right leg swelling that extends up to her thigh area. She denies any swelling on the left side.  She stated that that swelling does get some what better overnight. She tries to watch her sodium intake and does elevate her leg when she can. She stated that elevating her leg seems to help with the swelling.  She denies shortness of breath, temperature change to the foot and discoloration.   She is unable to weigh at home.

## 2020-05-28 NOTE — Telephone Encounter (Signed)
Pt c/o swelling: STAT is pt has developed SOB within 24 hours  1) How much weight have you gained and in what time span? Not  Any  2) If swelling, where is the swelling located? Right leg  3) Are you currently taking a fluid pill? yes  4) Are you currently SOB?no  5) Do you have a log of your daily weights (if so, list)?no  6) Have you gained 3 pounds in a day or 5 pounds in a week? no  7) Have you traveled recently?no

## 2020-05-29 ENCOUNTER — Other Ambulatory Visit: Payer: Self-pay

## 2020-05-29 ENCOUNTER — Ambulatory Visit (INDEPENDENT_AMBULATORY_CARE_PROVIDER_SITE_OTHER): Payer: 59 | Admitting: Neurology

## 2020-05-29 DIAGNOSIS — I609 Nontraumatic subarachnoid hemorrhage, unspecified: Secondary | ICD-10-CM

## 2020-05-29 DIAGNOSIS — G43109 Migraine with aura, not intractable, without status migrainosus: Secondary | ICD-10-CM

## 2020-05-29 DIAGNOSIS — R0683 Snoring: Secondary | ICD-10-CM

## 2020-05-29 DIAGNOSIS — I6523 Occlusion and stenosis of bilateral carotid arteries: Secondary | ICD-10-CM

## 2020-05-29 DIAGNOSIS — G4733 Obstructive sleep apnea (adult) (pediatric): Secondary | ICD-10-CM | POA: Diagnosis not present

## 2020-05-29 DIAGNOSIS — I4891 Unspecified atrial fibrillation: Secondary | ICD-10-CM

## 2020-05-29 DIAGNOSIS — I639 Cerebral infarction, unspecified: Secondary | ICD-10-CM

## 2020-05-29 DIAGNOSIS — I480 Paroxysmal atrial fibrillation: Secondary | ICD-10-CM

## 2020-05-31 ENCOUNTER — Other Ambulatory Visit: Payer: Self-pay

## 2020-05-31 DIAGNOSIS — I6523 Occlusion and stenosis of bilateral carotid arteries: Secondary | ICD-10-CM

## 2020-06-03 NOTE — Telephone Encounter (Signed)
Left message to call back  

## 2020-06-03 NOTE — Telephone Encounter (Signed)
Lasix 40 mg daily for 3 days.  Then check a BMP and a BNP.

## 2020-06-04 NOTE — Telephone Encounter (Signed)
PT RETURNING MELINDAS CALL, PLEASE CALL BACK WHEN POSSIBLE

## 2020-06-11 NOTE — Progress Notes (Signed)
EKG was intermittently irregular, most QRS complexes had a p wave.     IMPRESSION:  1. Severe Obstructive Sleep Apnea (OSA) at AHI 30.6/h and associated with a strong positional component. Supine AHI was 95.7/h.  2. Non-specific abnormal EKG. 3. Fragmented sleep with more arousals in the second half of the night.    RECOMMENDATIONS:  1. Advise to avoid supine sleep position.  2. Full night, attended, PAP titration study to optimize therapy is recommended- .

## 2020-06-11 NOTE — Procedures (Signed)
PATIENT'S NAME:  [name], [name] DOB:      1960/02/04      MR#:    [id number]     DATE OF RECORDING: 05/29/2020 M. Baird Kay M.D.:  Chilton Si, MD, Cardiology Study Performed:   Baseline Polysomnogram HISTORY:  [name] is a 60 year- old African American female patient and seen upon referral for a sleep medicine consultation- the patient has a primary neurologist, Dr. Terrace Arabia, who followed her for a stroke since 2021.   Chief concern according to patient: Had a PSG at the "[facility] sleep [facility] "in 2014 and was dx with OSA. Started CPAP 01/23/13 and used it only a short amount of time. Untreated, if still present (Dr. Tresa Endo). She recently had a SAH and a stroke and dx with Atrial Fib (Dr. Terrace Arabia [facility] 2022) and CAD. Dr. Duke Salvia had requested her to re-address OSA.  The patient endorsed the Epworth Sleepiness Scale at 0 points.   The patient's weight 196 pounds with a height of 61 (inches), resulting in a BMI of 37.1 kg/m2. The patient's neck circumference measured 16.5 inches.  CURRENT MEDICATIONS: Tylenol, Eliquis, Lipitor, Vitamin D3, Cardizem, Zetia, Flonase, Atarax, Xyzal, magnesium, Olmesartan-Amlodipine-HCT, Zanaflex, Topamax,   PROCEDURE:  This is a multichannel digital polysomnogram utilizing the Somnostar 11.2 system.  Electrodes and sensors were applied and monitored per AASM Specifications.   EEG, EOG, Chin and Limb EMG, were sampled at 200 Hz.  ECG, Snore and Nasal Pressure, Thermal Airflow, Respiratory Effort, CPAP Flow and Pressure, Oximetry was sampled at 50 Hz. Digital video and audio were recorded.      BASELINE STUDY: Lights Out was at 22:02 and Lights On at 05:00.  Total recording time (TRT) was 418.5 minutes, with a total sleep time (TST) of 333 minutes.   The patient's sleep latency was 32.5 minutes.  REM latency was 188.5 minutes.  The sleep efficiency was 79.6 %.     SLEEP ARCHITECTURE: WASO (Wake after sleep onset) was 74 minutes.  There were  30 minutes in Stage N1, 245 minutes Stage N2, 19.5 minutes Stage N3 and 38.5 minutes in Stage REM.  The percentage of Stage N1 was 9.%, Stage N2 was 73.6%, Stage N3 was 5.9% and Stage R (REM sleep) was 11.6%.   RESPIRATORY ANALYSIS:  There were a total of 170 respiratory events:  144 obstructive apneas, 1 central apnea and 2 mixed apneas with a total of 147 apneas and an apnea index (AI) of 26.5 /hour. There were 23 hypopneas with a hypopnea index of 4.1 /hour.  The total APNEA/HYPOPNEA INDEX (AHI) was 30.6/hour.  21 events occurred in REM sleep and 39 events in NREM. The REM AHI was 32.7 /hour, versus a non-REM AHI of 30.4. The patient spent 81.5 minutes of total sleep time in the supine position and 252 minutes in non-supine. The supine AHI was 95.7/h. versus a non-supine AHI of 9.5/h.Marland Kitchen  OXYGEN SATURATION & C02:  The Wake baseline 02 saturation was 98%, with the lowest being 84%. Time spent below 89% saturation equaled 4 minutes.  The arousals were noted as: 108 were spontaneous, 0 were associated with PLMs, 136 were associated with respiratory events. The patient had a total of 0 Periodic Limb Movements.  Snoring was noted. EKG was intermittently irregular, most QRS complexes had a p wave.     IMPRESSION:  1. Severe Obstructive Sleep Apnea (OSA) at AHI 30.6/h and associated with a strong positional component. Supine AHI was 95.7/h.  2.  Non-specific abnormal EKG. 3. Fragmented sleep with more arousals in the second half of the night.    RECOMMENDATIONS:  1. Advise to avoid supine sleep position.  2. Full night, attended, PAP titration study to optimize therapy is recommended- .    I certify that I have reviewed the entire raw data recording prior to the issuance of this report in accordance with the Standards of Accreditation of the American Academy of Sleep Medicine (AASM)   Melvyn Novas, MD Diplomat, American Board of Psychiatry and Neurology  Diplomat, American Board of Sleep  Medicine Wellsite geologist, [facility] at [facility]

## 2020-06-11 NOTE — Addendum Note (Signed)
Addended by: Melvyn Novas on: 06/11/2020 05:46 PM   Modules accepted: Orders

## 2020-06-12 ENCOUNTER — Telehealth: Payer: Self-pay | Admitting: Neurology

## 2020-06-12 NOTE — Telephone Encounter (Signed)
-----   Message from Melvyn Novas, MD sent at 06/11/2020  5:45 PM EDT ----- EKG was intermittently irregular, most QRS complexes had a p wave.     IMPRESSION:  1. Severe Obstructive Sleep Apnea (OSA) at AHI 30.6/h and associated with a strong positional component. Supine AHI was 95.7/h.  2. Non-specific abnormal EKG. 3. Fragmented sleep with more arousals in the second half of the night.    RECOMMENDATIONS:  1. Advise to avoid supine sleep position.  2. Full night, attended, PAP titration study to optimize therapy is recommended- .

## 2020-06-12 NOTE — Telephone Encounter (Signed)

## 2020-06-16 MED ORDER — FUROSEMIDE 40 MG PO TABS: ORAL_TABLET | ORAL | 1 refills | Status: DC

## 2020-06-16 NOTE — Addendum Note (Signed)
Addended by: Regis Bill B on: 06/16/2020 05:05 PM   Modules accepted: Orders

## 2020-06-16 NOTE — Telephone Encounter (Signed)
Spoke with patient, sent Rx to pharmacy, and placed lab orders

## 2020-06-20 ENCOUNTER — Other Ambulatory Visit: Payer: Self-pay | Admitting: Internal Medicine

## 2020-06-27 LAB — BASIC METABOLIC PANEL
BUN/Creatinine Ratio: 12 (ref 12–28)
BUN: 11 mg/dL (ref 8–27)
CO2: 22 mmol/L (ref 20–29)
Calcium: 9.8 mg/dL (ref 8.7–10.3)
Chloride: 104 mmol/L (ref 96–106)
Creatinine, Ser: 0.89 mg/dL (ref 0.57–1.00)
Glucose: 101 mg/dL — ABNORMAL HIGH (ref 65–99)
Potassium: 3.9 mmol/L (ref 3.5–5.2)
Sodium: 141 mmol/L (ref 134–144)
eGFR: 74 mL/min/{1.73_m2} (ref >59)

## 2020-06-27 LAB — BRAIN NATRIURETIC PEPTIDE: BNP: 32.2 pg/mL (ref 0.0–100.0)

## 2020-07-01 ENCOUNTER — Other Ambulatory Visit: Payer: Self-pay | Admitting: Neurology

## 2020-07-01 DIAGNOSIS — I4891 Unspecified atrial fibrillation: Secondary | ICD-10-CM

## 2020-07-01 DIAGNOSIS — G4733 Obstructive sleep apnea (adult) (pediatric): Secondary | ICD-10-CM

## 2020-07-03 NOTE — Telephone Encounter (Signed)
Called the pt and advised that we would start auto CPAP.  I reviewed PAP compliance expectations with the pt. Pt is agreeable to starting a CPAP. I advised pt that an order will be sent to a DME, [facility] , and [facility] will call the pt within about one week after they file with the pt's insurance. [facility]) will show the pt how to use the machine, fit for masks, and troubleshoot the CPAP if needed. A follow up appt was made for insurance purposes with Dr. Vickey Huger on 10/06/2020. Pt verbalized understanding to arrive 15 minutes early and bring their CPAP. A letter with all of this information in it will be mailed to the pt as a reminder. I verified with the pt that the address we have on file is correct. Pt verbalized understanding of results. Pt had no questions at this time but was encouraged to call back if questions arise. I have sent the order to [facility]) and have received confirmation that they have received the order.

## 2020-07-16 ENCOUNTER — Ambulatory Visit: Payer: 59 | Admitting: Neurology

## 2020-07-16 ENCOUNTER — Other Ambulatory Visit: Payer: Self-pay | Admitting: Internal Medicine

## 2020-07-22 ENCOUNTER — Ambulatory Visit (INDEPENDENT_AMBULATORY_CARE_PROVIDER_SITE_OTHER): Payer: 59 | Admitting: Orthopaedic Surgery

## 2020-07-22 ENCOUNTER — Encounter: Payer: Self-pay | Admitting: Orthopaedic Surgery

## 2020-07-22 ENCOUNTER — Ambulatory Visit: Payer: Self-pay

## 2020-07-22 ENCOUNTER — Other Ambulatory Visit: Payer: Self-pay

## 2020-07-22 VITALS — Ht 61.0 in | Wt 197.8 lb

## 2020-07-22 DIAGNOSIS — M1612 Unilateral primary osteoarthritis, left hip: Secondary | ICD-10-CM

## 2020-07-22 DIAGNOSIS — M1711 Unilateral primary osteoarthritis, right knee: Secondary | ICD-10-CM | POA: Diagnosis not present

## 2020-07-22 NOTE — Progress Notes (Signed)
Office Visit Note   Patient: [name]           Date of Birth: [birth year]           MRN: [id number] Visit Date: 07/22/2020              Requested by: Dorothyann Peng, MD [address] [place],  [place] [address] PCP: Dorothyann Peng, MD   Assessment & Plan: Visit Diagnoses:  1. Primary osteoarthritis of right knee   2. Primary osteoarthritis of left hip     Plan: Impression is end-stage right knee and left hip DJD.  We repeated these injections today.  We will obtain the necessary presurgical clearances from Dr. Duke Salvia and Dr. Roda Shutters of neurology prior to scheduling surgery.  Chrystine Oiler will be in touch with her in the near future.  Patient will need to be off of Eliquis 3days before surgery.  Follow-Up Instructions: No follow-ups on file.   Orders:  Orders Placed This Encounter  Procedures   US Guided Needle Placement - No Linked Charges   No orders of the defined types were placed in this encounter.     Procedures: No procedures performed   Clinical Data: No additional findings.   Subjective: Chief Complaint  Patient presents with   Right Knee - Pain, Follow-up   Left Hip - Pain, Follow-up    Kalima is a 60 year old female who returns today for follow-up of chronic right knee DJD and left hip DJD.  She is requesting injections to both the right knee and left hip.  She has decided that she would like to undergo left hip replacement later this year.   Review of Systems   Objective: Vital Signs: Ht 5\' 1"  (1.549 m)   Wt 197 lb 12.8 oz (89.7 kg)   LMP 06/03/2011   BMI 37.37 kg/m   Physical Exam  Ortho Exam Left hip the right knee exams are unchanged. Specialty Comments:  No specialty comments available.  Imaging: 06/05/2011 Guided Needle Placement - No Linked Charges  Result Date: 07/22/2020 Ultrasound guided injection is preferred based studies that show increased duration, increased effect, greater accuracy, decreased procedural pain,  increased response rate, and decreased cost with ultrasound guided versus blind injection.   Verbal informed consent obtained.  Time-out conducted.  Noted no overlying erythema, induration, or other signs of local infection. Ultrasound-guided left hip injection: After sterile prep with Betadine, injected 4 cc 0.25% bupivacaine without epinephrine and 6 mg betamethasone using a 22-gauge spinal needle, passing the needle through the iliofemoral ligament into the femoral head/neck junction.  Injectate seen filling joint capsule.  Good immediate relief.      PMFS History: Patient Active Problem List   Diagnosis Date Noted   Paroxysmal atrial fibrillation (HCC) 04/28/2020   Loud snoring 04/28/2020   Atrial fibrillation with rapid ventricular response (HCC) 03/17/2020   Hypokalemia    Pulmonary hypertension, primary (HCC)    Atrial fibrillation with RVR (HCC) 03/16/2020   Elevated troponin    Primary osteoarthritis of right knee 11/20/2019   Cerebrovascular accident (CVA) (HCC) 10/18/2019   ICAO (internal carotid artery occlusion), right 10/18/2019   Primary osteoarthritis of left hip 09/11/2019   SAH (subarachnoid hemorrhage) (HCC) 08/07/2019   Acute embolic stroke (HCC)    Left hip pain 06/25/2019   Severe left groin pain 06/21/2019   Bilateral carotid artery stenosis 06/21/2019   Pure hypercholesterolemia 05/31/2019   CAD in native artery 04/09/2019   Complicated migraine 08/23/2018  Hyperglycemia 07/10/2018   Abnormal stress test 08/06/2016   Essential hypertension 06/30/2016   Hypothyroidism 06/30/2016   Past Medical History:  Diagnosis Date   Allergy    Arthritis    back    Atypical chest pain 06/30/2016   Back pain    DUE TO INJURY AT WORK ON05/2013   Bruises easily    Chronic lower back pain    Coronary artery disease    Depression    was on meds 2 yrs ago    Headache(784.0)    rarely   History of bronchitis    last time about 31yrs ago    Hypertension    takes  Benicar daily   Hypothyroidism 06/30/2016   Pure hypercholesterolemia 05/31/2019   Vitamin D deficiency    takes Vitamin D 2 times/wk   Weakness    and numbness right foot    Family History  Problem Relation Age of Onset   Heart disease Mother    CAD Mother    Stroke Mother    Heart disease Sister    Heart attack Sister    Liver disease Brother    Other Father        unknown medical history   CAD Brother    Colon cancer Neg Hx    Esophageal cancer Neg Hx    Rectal cancer Neg Hx    Stomach cancer Neg Hx     Past Surgical History:  Procedure Laterality Date   BACK SURGERY     CARPAL TUNNEL RELEASE Right    CESAREAN SECTION  1988   COLONOSCOPY     EXPLORATORY LAPAROTOMY  1991   "took out fatty tumor" (11/09/2012)   IR ANGIO INTRA EXTRACRAN SEL COM CAROTID INNOMINATE BILAT MOD SED  08/10/2019   IR ANGIO VERTEBRAL SEL VERTEBRAL BILAT MOD SED  08/10/2019   IR US GUIDE VASC ACCESS RIGHT  08/10/2019   LEFT HEART CATH AND CORONARY ANGIOGRAPHY N/A 08/06/2016   Procedure: Left Heart Cath and Coronary Angiography;  Surgeon: Lyn Records, MD;  Location: [facility] INVASIVE CV LAB;  Service: Cardiovascular;  Laterality: N/A;   LEFT HEART CATH AND CORONARY ANGIOGRAPHY N/A 07/12/2018   Procedure: LEFT HEART CATH AND CORONARY ANGIOGRAPHY;  Surgeon: Marykay Lex, MD;  Location: [facility] INVASIVE CV LAB;  Service: Cardiovascular;  Laterality: N/A;   LUMBAR LAMINECTOMY/DECOMPRESSION MICRODISCECTOMY  11/09/2012   "L3-4" (11/09/2012)   LUMBAR LAMINECTOMY/DECOMPRESSION MICRODISCECTOMY N/A 11/09/2012   Procedure: L3-L4 DECOMPRESSION AND MICRODISCECTOMY   (1 LEVEL);  Surgeon: Venita Lick, MD;  Location: [facility] OR;  Service: Orthopedics;  Laterality: N/A;   Social History   Occupational History   Occupation: Control and instrumentation engineer  Tobacco Use   Smoking status: Never   Smokeless tobacco: Never  Vaping Use   Vaping Use: Never used  Substance and Sexual Activity   Alcohol use: Yes    Alcohol/week: 7.0  standard drinks    Types: 7 Glasses of wine per week    Comment: social   Drug use: No   Sexual activity: Yes    Birth control/protection: Post-menopausal

## 2020-07-22 NOTE — Progress Notes (Signed)
Subjective: Patient is here for ultrasound-guided intra-articular left hip injection.   Prior injections have helped.  Waiting for neurology clearance for replacement.  Objective:  Pain with IR, walks with a limp.  Procedure: Ultrasound guided injection is preferred based studies that show increased duration, increased effect, greater accuracy, decreased procedural pain, increased response rate, and decreased cost with ultrasound guided versus blind injection.   Verbal informed consent obtained.  Time-out conducted.  Noted no overlying erythema, induration, or other signs of local infection. Ultrasound-guided left hip injection: After sterile prep with Betadine, injected 4 cc 0.25% bupivacaine without epinephrine and 6 mg betamethasone using a 22-gauge spinal needle, passing the needle through the iliofemoral ligament into the femoral head/neck junction.  Injectate seen filling joint capsule.  Good immediate relief.

## 2020-07-31 ENCOUNTER — Other Ambulatory Visit: Payer: Self-pay | Admitting: Internal Medicine

## 2020-08-08 ENCOUNTER — Telehealth (HOSPITAL_BASED_OUTPATIENT_CLINIC_OR_DEPARTMENT_OTHER): Payer: Self-pay | Admitting: *Deleted

## 2020-08-08 NOTE — Telephone Encounter (Signed)
   [facility] Pre-operative Risk Assessment    Patient Name: CALEN POSCH  DOB: April 14, 1960 MRN: [id number]  Request for surgical clearance:  What type of surgery is being performed? LEFT TOTAL HIP ARTHROPLASTY   When is this surgery scheduled? TBD  What type of clearance is required (medical clearance vs. Pharmacy clearance to hold med vs. Both)? BOTH  Are there any medications that need to be held prior to surgery and how long? ELIQUIS 3 DAYS   Practice name and name of physician performing surgery? [facility]  What is the office phone number? [phone]   7.   What is the office fax number? [place]   Anesthesia type (None, local, MAC, general) ? SPINAL

## 2020-08-11 NOTE — Telephone Encounter (Signed)
Left message for the pt to call back to schedule an appt for pre op clearance with Dr. Duke Salvia or APP. We can schedule the pt either at [facility], [facility] or [facility] office for which ever location may see the pt first and accommodating for the pt.

## 2020-08-11 NOTE — Telephone Encounter (Signed)
Patient with diagnosis of atrial fibrillation on Eliquis for anticoagulation.    Procedure: left total hip arthroplasty Date of procedure: TBD   CHA2DS2-VASc Score = 5  This indicates a 7.2% annual risk of stroke. The patient's score is based upon: CHF History: No HTN History: Yes Diabetes History: No Stroke History: Yes Vascular Disease History: Yes Age Score: 0 Gender Score: 1   CrCl 69 (with adjusted body weight) Platelet count 247  Stroke of MCA July 2021.  It was also noted that patient had subdural hematoma on scan.  (08/07/19)  Per office protocol, patient can hold Eliquis for 3 days prior to procedure.    Will defer to Dr. Duke Salvia as to whether patient will need to bridge with Lovenox prior to procedure.   For orthopedic procedures please be sure to resume therapeutic (not prophylactic) dosing.

## 2020-08-11 NOTE — Telephone Encounter (Signed)
   Name: [name]  DOB: 1960/12/28  MRN: [id number]  Primary Cardiologist: Chilton Si, MD  Chart reviewed as part of pre-operative protocol coverage. Because of [name]'s past medical history and time since last visit, she will require a follow-up visit in order to better assess preoperative cardiovascular risk.  Pre-op covering staff: - Please schedule appointment and call patient to inform them. If patient already had an upcoming appointment within acceptable timeframe, please add "pre-op clearance" to the appointment notes so provider is aware. - Please contact requesting surgeon's office via preferred method (i.e, phone, fax) to inform them of need for appointment prior to surgery.  If applicable, this message will also be routed to pharmacy pool and/or primary cardiologist for input on holding anticoagulant/antiplatelet agent as requested below so that this information is available to the clearing provider at time of patient's appointment.    Patient is overdue for follow up with Dr. Duke Salvia, previous visit recommended follow up in June. It does not appears patient ever returned.   [place], [place]  08/11/2020, 9:02 AM

## 2020-08-11 NOTE — Telephone Encounter (Signed)
Clinical pharmacist to review Eliquis 

## 2020-08-12 ENCOUNTER — Telehealth: Payer: Self-pay | Admitting: Neurology

## 2020-08-12 NOTE — Telephone Encounter (Signed)
Left message x 2 for the pt to call the office to schedule an appt for pre op clearance. Per Santiago Glad, Scheduling Supervisor has given the ok to use 9 am on 08/13/20 on Dr. Izora Ribas schedule if we cannot find an appt with Dr. Duke Salvia or at the [facility] office. If appt for tomorrow is taken we may offer an appt possibly at [facility] location as well. Left message for pt to call back.

## 2020-08-12 NOTE — Telephone Encounter (Signed)
I have received a surgical clearance for this patient in Dr. Zannie Cove absence. The patient is planning a left total hip arthroplasty.  Requesting physician is Dr. Glee Arvin.  Patient has not seen Dr. Terrace Arabia since October 2021.  She has canceled a couple of appointments in the interim.  I would favor that Dr. Terrace Arabia review this surgical clearance upon her return.  Patient may need a follow-up first.

## 2020-08-13 ENCOUNTER — Encounter: Payer: Self-pay | Admitting: *Deleted

## 2020-08-13 NOTE — Telephone Encounter (Signed)
I will route back to pre op pool as that we have not be able to reach pt.

## 2020-08-13 NOTE — Telephone Encounter (Signed)
Agree with sending letter to the patient for her to call us to arrange preop clearance visit. Thank you for notifying the surgeon's office as well.

## 2020-08-13 NOTE — Telephone Encounter (Signed)
Our office has attempted x 3 to reach the pt to schedule an appt for pre op clearance. I will send FYI again to surgeon's office that we have not been able to reach the pt. She will need an appt with cardiology be she can be cleared.

## 2020-08-13 NOTE — Telephone Encounter (Signed)
I will send out letter to call for appt. I will remove from the pre op call back pool at this time.

## 2020-08-13 NOTE — Telephone Encounter (Addendum)
I spoke to the patient and scheduled a follow up with Dr. Terrace Arabia on 09/09/20.  She has not scheduled her surgery yet. [facility] (Dr. Cheral Almas) will require a letter of medical clearance from a neurology standpoint. Letter needs to faxed to Attn: Debbie at [phone].  Per clearance request: surgery planned: left total hip arthoplasty, anesthesia: spinal.

## 2020-08-13 NOTE — Telephone Encounter (Signed)
Letter sent to pt through MY CHART ?

## 2020-08-21 ENCOUNTER — Other Ambulatory Visit: Payer: Self-pay | Admitting: Cardiovascular Disease

## 2020-08-21 ENCOUNTER — Other Ambulatory Visit: Payer: Self-pay | Admitting: Internal Medicine

## 2020-08-21 ENCOUNTER — Other Ambulatory Visit: Payer: Self-pay | Admitting: Nurse Practitioner

## 2020-08-21 DIAGNOSIS — L2489 Irritant contact dermatitis due to other agents: Secondary | ICD-10-CM

## 2020-09-09 ENCOUNTER — Ambulatory Visit (INDEPENDENT_AMBULATORY_CARE_PROVIDER_SITE_OTHER): Payer: 59 | Admitting: Neurology

## 2020-09-09 ENCOUNTER — Encounter: Payer: Self-pay | Admitting: Neurology

## 2020-09-09 VITALS — BP 130/77 | HR 81 | Ht 61.0 in | Wt 195.5 lb

## 2020-09-09 DIAGNOSIS — I639 Cerebral infarction, unspecified: Secondary | ICD-10-CM

## 2020-09-09 DIAGNOSIS — I609 Nontraumatic subarachnoid hemorrhage, unspecified: Secondary | ICD-10-CM | POA: Diagnosis not present

## 2020-09-09 DIAGNOSIS — I6523 Occlusion and stenosis of bilateral carotid arteries: Secondary | ICD-10-CM | POA: Diagnosis not present

## 2020-09-09 DIAGNOSIS — I4891 Unspecified atrial fibrillation: Secondary | ICD-10-CM

## 2020-09-09 MED ORDER — TOPIRAMATE 100 MG PO TABS: 100.0000 mg | ORAL_TABLET | Freq: Every day | ORAL | 4 refills | Status: DC

## 2020-09-09 NOTE — Telephone Encounter (Signed)
Pt was seen by Dr. Terrace Arabia today. Letter for surgery clearance has been faxed and confirmed to the number below.

## 2020-09-09 NOTE — Progress Notes (Signed)
ASSESSMENT AND PLAN 60 y.o. year old female  Stroke Bilateral carotid artery stenosis Total occlusion of right internal carotid artery, 40 to 59% stenosis of left internal carotid artery in June 2021 Repeat ultrasound of carotid artery History of right MCA stroke also subarachnoid hemorrhage in the setting of aspirin and Plavix  Atrial fibrillation  On Eliquis 5 mg twice a day now  Headache with migraine features Much improved, Keep Topamax 100 mg at bedtime   Left hip end-stage degenerative disease, pending left hip replacement in Oct 2022  Patient is at high risk for thromboembolic event, due to her paroxysmal atrial fibrillation, total occlusion of right internal carotid artery, I will repeat ultrasound of carotid artery, if she is to proceed with elective left hip replacement, she would need bridging therapy either with IV heparin or subcutaneous Lovenox, May also consult cardiologist for detail    DIAGNOSTIC DATA (LABS, IMAGING, TESTING) - I reviewed patient records, labs, notes, testing and imaging myself where available.   HISTORY OF PRESENT ILLNESS: [name] is a 60 year old female, seen in request by her primary care physician Dr. Baird Cancer, Bailey Mech for episode of visual loss, headaches, initial evaluation was on August 23, 2018.   I have reviewed and summarized the referring note from the referring physician.  She had past medical history of hyperlipidemia, hypertension, coronary artery disease, status post stent on July 12, 2018, currently taking aspirin and Plavix   She denies a previous history of migraine headaches, on August 18, 2018, while sitting at the computer, she noticed difficulty reading, then bilateral vision went out, she was able to lie down with eyes closed resting for 30 minutes, her vision came back normal, at the same time she noticed mild left temporal region throbbing headaches, when she went back to look at computer again, she experienced visual  loss bilaterally again, was sent to the emergency room   I personally reviewed MRI of the brain no acute abnormality   MRA of neck: 50% stenosis of left internal carotid artery, critical stenosis of proximal right internal carotid artery, estimated to be greater than 80% stenosis, 50% stenosis of the left internal carotid artery   MRA of the brain showed no significant large vessel stenosis intracranially   She experienced a severe headache on August 08/2018, severe holoacranial pounding headache with associated light noise sensitivity, nauseous that was helped by Tylenol   Laboratory evaluations in 2020, LDL 148, cholesterol 217, negative C-reactive protein, ESR was mildly elevated 50, UDS was negative, normal TSH, A1c 5.9  UPDATE June 21 2019: Her headache has much improved, tolerating Topamax 100 mg every night  She remained on aspirin and Plavix, she did not have ultrasound of carotid artery to follow-up her severe right carotid artery stenosis, more than 80% based on MRA in 2020  She also complains of low back pain, radiating pain to left hip, left groin area  UPDATE Oct 18 2019: She is accompanied by her friends at today's clinical visit, we went over her medical history in detail  Ultrasound of carotid artery on June 27 2019 showed 80 to 99% stenosis of right internal carotid artery, left side was 40 to 59% stenosis  She was referred to vascular surgeon  X-ray of left hip showed end-stage degenerative changes of left hip, pending left hip replacement surgery  On August 07, 2019, she had severe headache on the right side, also describe left leg weakness.  MRI of the brain showed 2 subcentimeter foci of  acute infarction in the right insular, affecting the cortex, also subarachnoid hemorrhage within the sulci on the right  Four-vessel angiogram showed occluded right internal carotid artery at the bulb, 30% stenosis of left internal carotid artery  Repeat MRI of the brain on September 06, 2019 showed subtle hemosiderin within the right frontal sulci, no acute abnormality  Laboratory evaluations in September 2021 showed normal B12, CMP, creatinine of 1.0, sodium of 133, normal CBC hemoglobin of 11.5, normal vitamin D 33.7, negative hepatitis C, A1c of 6.5,  UPDATE September 09 2020: I personally reviewed MRI of the brain on March 16, 2020, there was no acute abnormalities.  Laboratory evaluations in 2022: CBC, hemoglobin of 11, hematocrit of 35,  She is taking Topamax 131m qhs as migraine prevention.  I was able to review cardiologist Dr. RBlenda Mountsevaluation: 07/30/2020, new onset atrial fibrillation, with rapid ventricular rate, was put on Eliquis 5 mg twice daily  since.  Patient is at high risk for thromboembolic event due to her new onset paroxysmal atrial fibrillation, and also 100% occlusion of right internal carotid artery, mild to moderate stenosis of left internal carotid artery,  I will order repeat ultrasound of carotid artery to evaluate the stenosis on the left side  If she is to proceed with left hip replacement, she would need bridging therapy, either IV heparin or subcutaneous Lovenox to avoid the thromboembolic event  PHYSICAL EXAM  Vitals:   09/09/20 0804  BP: 130/77  Pulse: 81  Weight: 195 lb 8 oz (88.7 kg)  Height: _0  (1.549 m)   Body mass index is 36.94 kg/m.   PHYSICAL EXAMNIATION:  Gen: NAD, conversant, well nourised, well groomed               NEUROLOGICAL EXAM:  MENTAL STATUS: Speech/Cognition: Awake, alert, normal speech, oriented to history taking and casual conversation.  CRANIAL NERVES: CN II: Visual fields are full to confrontation.  Pupils are round equal and briskly reactive to light. CN III, IV, VI: extraocular movement are normal. No ptosis. CN V: Facial sensation is intact to light touch. CN VII: Face is symmetric with normal eye closure and smile. CN VIII: Hearing is normal to casual conversation CN IX, X: Palate  elevates symmetrically. Phonation is normal. CN XI: Head turning and shoulder shrug are intact CN XII: Tongue is midline with normal movements and no atrophy.  MOTOR: Muscle bulk and tone are normal. Muscle strength is normal.  REFLEXES: Reflexes are 2  and symmetric at the biceps, triceps, knees and ankles. Plantar responses are flexor.  SENSORY: Intact to light touch, pinprick, positional and vibratory sensation at fingers and toes.  COORDINATION: There is no trunk or limb ataxia.    GAIT/STANCE: Need push-up to get up from seated position, dragging her left leg,antalgic   REVIEW OF SYSTEMS: Out of a complete 14 system review of symptoms, the patient complains only of the following symptoms, and all other reviewed systems are negative.  As above ALLERGIES: Allergies  Allergen Reactions   Other Itching and Other (See Comments)    Patient experienced intense itching in her arm that was accessed for a past angiogram    HOME MEDICATIONS: Outpatient Medications Prior to Visit  Medication Sig Dispense Refill   acetaminophen (TYLENOL) 500 MG tablet Take 500-1,000 mg by mouth every 6 (six) hours as needed (pain or headaches).     apixaban (ELIQUIS) 5 MG TABS tablet Take 1 tablet (5 mg total) by mouth 2 (two) times daily.  180 tablet 1   atorvastatin (LIPITOR) 80 MG tablet Take 1 tablet (80 mg total) by mouth daily at 6 PM. 90 tablet 2   cholecalciferol (VITAMIN D3) 25 MCG (1000 UNIT) tablet Take 1,000 Units by mouth daily.      diltiazem (CARDIZEM CD) 120 MG 24 hr capsule TAKE 1 CAPSULE BY MOUTH EVERY DAY 90 capsule 1   ezetimibe (ZETIA) 10 MG tablet TAKE 1 TABLET BY MOUTH EVERY DAY 90 tablet 3   fluticasone (FLONASE) 50 MCG/ACT nasal spray PLACE 2 SPRAYS IN BOTH NOSTRILS DAILY AS NEEDED FOR ALLERGIES OR RHINITIS 48 mL 1   furosemide (LASIX) 40 MG tablet DAILY FOR 3 DAYS AND THEN AS DIRECTED 20 tablet 1   hydrOXYzine (ATARAX/VISTARIL) 25 MG tablet Take 1 tablet (25 mg total) by  mouth 3 (three) times daily as needed. 30 tablet 0   levocetirizine (XYZAL) 5 MG tablet TAKE 1 TABLET BY MOUTH EVERY DAY 90 tablet 1   Magnesium 250 MG TABS TAKE 1 TABLET BY MOUTH DAILY WITH EVENING MEALS 90 tablet 2   Olmesartan-amLODIPine-HCTZ 20-5-12.5 MG TABS TAKE 1 TABLET BY MOUTH EVERY DAY 90 tablet 1   topiramate (TOPAMAX) 100 MG tablet Take 1 tablet (100 mg total) by mouth at bedtime. 90 tablet 4   tiZANidine (ZANAFLEX) 4 MG tablet Take 1 tablet (4 mg total) by mouth every 8 (eight) hours as needed for muscle spasms. 30 tablet 0   No facility-administered medications prior to visit.    PAST MEDICAL HISTORY: Past Medical History:  Diagnosis Date   Allergy    Arthritis    back    Atypical chest pain 06/30/2016   Back pain    DUE TO INJURY AT WORK ON05/2013   Bruises easily    Chronic lower back pain    Coronary artery disease    Depression    was on meds 2 yrs ago    Headache(784.0)    rarely   History of bronchitis    last time about 27yr ago    Hypertension    takes Benicar daily   Hypothyroidism 06/30/2016   Pure hypercholesterolemia 05/31/2019   Vitamin D deficiency    takes Vitamin D 2 times/wk   Weakness    and numbness right foot    PAST SURGICAL HISTORY: Past Surgical History:  Procedure Laterality Date   BACK SURGERY     CARPAL TUNNEL RELEASE Right    [place]  "took out fatty tumor" (11/09/2012)   IR ANGIO INTRA EXTRACRAN SEL COM CAROTID INNOMINATE BILAT MOD SED  08/10/2019   IR ANGIO VERTEBRAL SEL VERTEBRAL BILAT MOD SED  08/10/2019   [place] ACC[place] RIGHT  08/10/2019   LEFT HEART CATH AND CORONARY ANGIOGRAPHY N/A 08/06/2016   Procedure: Left Heart Cath and Coronary Angiography;  Surgeon: SBelva Crome MD;  [facility];  Service: Cardiovascular;  Laterality: N/A;   LEFT HEART CATH AND CORONARY ANGIOGRAPHY N/A 07/12/2018   Procedure: LEFT HEART CATH AND CORONARY  ANGIOGRAPHY;  Surgeon: HLeonie Man MD;  [facility];  Service: Cardiovascular;  Laterality: N/A;   LUMBAR LAMINECTOMY/DECOMPRESSION MICRODISCECTOMY  11/09/2012   "L3-4" (11/09/2012)   LUMBAR LAMINECTOMY/DECOMPRESSION MICRODISCECTOMY N/A 11/09/2012   Procedure: L3-L4 DECOMPRESSION AND MICRODISCECTOMY   (1 LEVEL);  Surgeon: DMelina Schools MD;  [place] [facility]  Service: Orthopedics;  Laterality: N/A;    FAMILY  HISTORY: Family History  Problem Relation Age of Onset   Heart disease Mother    CAD Mother    Stroke Mother    Heart disease Sister    Heart attack Sister    Liver disease Brother    Other Father        unknown medical history   CAD Brother    Colon cancer Neg Hx    Esophageal cancer Neg Hx    Rectal cancer Neg Hx    Stomach cancer Neg Hx     SOCIAL HISTORY: Social History   Socioeconomic History   Marital status: Divorced    Spouse name: Not on file   Number of children: 1   Years of education: Not on file   Highest education level: Some college, no degree  Occupational History   Occupation: Sports administrator  Tobacco Use   Smoking status: Never   Smokeless tobacco: Never  Vaping Use   Vaping Use: Never used  Substance and Sexual Activity   Alcohol use: Yes    Alcohol/week: 7.0 standard drinks    Types: 7 Glasses of wine per week    Comment: social   Drug use: No   Sexual activity: Yes    Birth control/protection: Post-menopausal  Other Topics Concern   Not on file  Social History Narrative   Right-handed.   Occasional caffeine.   Lives alone.   Social Determinants of Health   Financial Resource Strain: Not on file  Food Insecurity: Not on file  Transportation Needs: Not on file  Physical Activity: Not on file  Stress: Not on file  Social Connections: Not on file  Intimate Partner Violence: Not on file         Marcial Pacas, M.D. Ph.D.  [facility] [place], [place]  [id number] Phone: [phone] Fax:      [phone]

## 2020-09-12 ENCOUNTER — Encounter (HOSPITAL_BASED_OUTPATIENT_CLINIC_OR_DEPARTMENT_OTHER): Payer: Self-pay | Admitting: Cardiovascular Disease

## 2020-09-12 ENCOUNTER — Other Ambulatory Visit: Payer: Self-pay

## 2020-09-12 ENCOUNTER — Ambulatory Visit (INDEPENDENT_AMBULATORY_CARE_PROVIDER_SITE_OTHER): Payer: 59 | Admitting: Cardiovascular Disease

## 2020-09-12 ENCOUNTER — Other Ambulatory Visit: Payer: Self-pay | Admitting: Neurology

## 2020-09-12 VITALS — BP 116/72 | HR 84 | Ht 61.0 in | Wt 195.9 lb

## 2020-09-12 DIAGNOSIS — I6523 Occlusion and stenosis of bilateral carotid arteries: Secondary | ICD-10-CM

## 2020-09-12 DIAGNOSIS — I10 Essential (primary) hypertension: Secondary | ICD-10-CM | POA: Diagnosis not present

## 2020-09-12 DIAGNOSIS — Z01818 Encounter for other preprocedural examination: Secondary | ICD-10-CM

## 2020-09-12 DIAGNOSIS — G4733 Obstructive sleep apnea (adult) (pediatric): Secondary | ICD-10-CM

## 2020-09-12 DIAGNOSIS — I480 Paroxysmal atrial fibrillation: Secondary | ICD-10-CM

## 2020-09-12 DIAGNOSIS — Z01810 Encounter for preprocedural cardiovascular examination: Secondary | ICD-10-CM | POA: Diagnosis not present

## 2020-09-12 HISTORY — DX: Encounter for other preprocedural examination: Z01.818

## 2020-09-12 NOTE — Assessment & Plan Note (Signed)
Currently in sinus rhythm.  Continue Eliquis.

## 2020-09-12 NOTE — Assessment & Plan Note (Signed)
100% R ICA stenosis and 60-70% L ICA stenosis on CT 02/2020.  Carotid Dopplers are pending with neurology, as there was concern that may be overestimated due to blooming artifact.  She was reminded that she is due for follow up with vascular surgery.  Continue Eliquis and statin.  She understands that she cannot stop all blood thinners pre-operatively.

## 2020-09-12 NOTE — Assessment & Plan Note (Signed)
Continue CPAP.  

## 2020-09-12 NOTE — Patient Instructions (Addendum)
Medication Instructions:  Your physician recommends that you continue on your current medications as directed. Please refer to the Current Medication list given to you today.  *If you need a refill on your cardiac medications before your next appointment, please call your pharmacy*   Lab Work: NONE  If you have labs (blood work) drawn today and your tests are completely normal, you will receive your results only by: MyChart Message (if you have MyChart) OR A paper copy in the mail If you have any lab test that is abnormal or we need to change your treatment, we will call you to review the results.   Testing/Procedures: NONE   Follow-Up: At [facility], you and your health needs are our priority.  As part of our continuing mission to provide you with exceptional heart care, we have created designated Provider Care Teams.  These Care Teams include your primary Cardiologist (physician) and Advanced Practice Providers (APPs -  Physician Assistants and Nurse Practitioners) who all work together to provide you with the care you need, when you need it.  We recommend signing up for the patient portal called "MyChart".  Sign up information is provided on this After Visit Summary.  MyChart is used to connect with patients for Virtual Visits (Telemedicine).  Patients are able to view lab/test results, encounter notes, upcoming appointments, etc.  Non-urgent messages can be sent to your provider as well.   To learn more about what you can do with MyChart, go to [email or web address].    Your next appointment:   12 month(s)  The format for your next appointment:   In Person  Provider:   Chilton Si, MD  Your physician recommends that you schedule a follow-up appointment in: PHARM D FOR LOVENOX BRIDGING PRIOR TO YOUR SURGERY  Other Instructions  YOU HAVE BEEN CLEARED FOR YOUR UPCOMING SURGERY

## 2020-09-12 NOTE — Assessment & Plan Note (Signed)
BP well-controlled.  Continue olmesartan/amlodipine/HCTZ.

## 2020-09-12 NOTE — Assessment & Plan Note (Addendum)
[name] does not have any unstable cardiac symptoms.  She does have a family history of prolonged QTC.  Her QT is not prolonged.  Would recommend avoiding QT prolonging agents with anesthesia if at all possible.  She also has known complete ICA stenosis on the right and at least moderate on the left.  Repeat carotid Dopplers are pending and she knows that she needs to follow-up with vascular surgery.  Given her atrial fibrillation and obstructive carotid disease, I agree with her neurologist that she would be better bridged and to hold all blood thinners preoperatively.  We will arrange for her to see our pharmacist and do Lovenox shots for 5 days prior to surgery.  She is able to achieve 4 METS of activity and remain limitation is her hip.  Therefore, no repeat ischemic testing is indicated at this time.  Her PCI was over a year ago, and she had otherwise mild disease except for the left circumflex which was stented.

## 2020-09-12 NOTE — Progress Notes (Signed)
Cardiology Office Note   Date:  09/12/2020   ID:  [name], DOB 03/15/1960, MRN [id number]  PCP:  Dorothyann Peng, MD  Cardiologist:  Chilton Si, MD  Electrophysiologist:  None   Evaluation Performed:  Follow-Up Visit  Chief Complaint:  CAD  History of Present Illness:    [name] is a 60 y.o. female CAD s/p PCI, hypertension, carotid stenosis, and stroke, who presents today for pre-operative risk assessment.  She was initially seen 06/2016 for an abnormal EKG.  Her sister died of a heart attack at age 64 without any preceding symptoms.  She also has a family history of long QT syndrome. Her mother, aunt, and 2 cousins all have this disease. She also reported intermittent episodes of atypical chest pain and was referred for an ETT 07/22/16 that revealed ST depressions.  She achieved 10.1 METs on the Bruce protocol.  She was referred for left heart catheterization that showed normal coronary arteries.   [name] was also noted to have a systolic murmur.  She was referred for an echo that revealed LVEF 65-70% and grade 2 diastolic dysfunction.  Pulmonary pressures were mildly elevated (PASP 48 mmHg).  [name] presented to the hospital with chest pain 07/2018.  She underwent LHC which showed 85% mid left circumflex disease with 30 to 40% stenoses in OM 2.  She underwent PCI of the left circumflex.  Left ventriculography revealed LVEF 55 to 65%.  She followed up with Joni Reining, DNP on 07/2018 and had some wrist pain but was otherwise well. Her BP was running low so olmesartan/HCTZ was reduced but not controlled. She switched to taking it twice daily.   She presented to the hospital 07/2019 with a subarachnoid hemorrhage on aspirin and plavix. She had a carotid angiogram that was 80-99% occluded on right and 30% on left. She was referred to vein and vascular follow-up. She had a CT-A of the neck 02/2020 which showed that her right ICA was 100% occluded, and there was 60-70%  left ICA stenosis.  I last saw her in the hospital 03/16/2020. She presented with increased chest pressure and palpitations, and was found to be in new atrial fibrillation with RVR. She presents today for pre-operative clearance prior to hip surgery. Today, she has been feeling pretty good aside from her left hip pain. She is unable to sleep at night or put pressure on her hip. She is now using an APAP machine. For activity, she will take walks in [place] or [employer]. She is able to walk up a flight of stairs, including at the [employer]. 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 Previously, she noted LE edema following increased salt intake. She denies any palpitations, chest pain, or shortness of breath. No lightheadedness, headaches, syncope, orthopnea, or PND.    Past Medical History:  Diagnosis Date   Allergy    Arthritis    back    Atypical chest pain 06/30/2016   Back pain    DUE TO INJURY AT WORK ON05/2013   Bruises easily    Chronic lower back pain    Coronary artery disease    Depression    was on meds 2 yrs ago    Headache(784.0)    rarely   History of bronchitis    last time about 45yrs ago    Hypertension    takes Benicar daily   Hypothyroidism 06/30/2016   OSA (obstructive  sleep apnea) 04/28/2020   Preoperative evaluation of a medical condition to rule out surgical contraindications (TAR required) 09/12/2020   Pure hypercholesterolemia 05/31/2019   Vitamin D deficiency    takes Vitamin D 2 times/wk   Weakness    and numbness right foot    Past Surgical History:  Procedure Laterality Date   BACK SURGERY     CARPAL TUNNEL RELEASE Right    CESAREAN SECTION  1988   COLONOSCOPY     EXPLORATORY LAPAROTOMY  1991   "took out fatty tumor" (11/09/2012)   IR ANGIO INTRA EXTRACRAN SEL COM CAROTID INNOMINATE BILAT MOD SED  08/10/2019   IR ANGIO VERTEBRAL SEL VERTEBRAL BILAT MOD SED  08/10/2019   IR US GUIDE VASC ACCESS RIGHT   08/10/2019   LEFT HEART CATH AND CORONARY ANGIOGRAPHY N/A 08/06/2016   Procedure: Left Heart Cath and Coronary Angiography;  Surgeon: Lyn RecordsSmith, Henry W, MD;  [facility];  Service: Cardiovascular;  Laterality: N/A;   LEFT HEART CATH AND CORONARY ANGIOGRAPHY N/A 07/12/2018   Procedure: LEFT HEART CATH AND CORONARY ANGIOGRAPHY;  Surgeon: Marykay LexHarding, David W, MD;  [facility];  Service: Cardiovascular;  Laterality: N/A;   LUMBAR LAMINECTOMY/DECOMPRESSION MICRODISCECTOMY  11/09/2012   "L3-4" (11/09/2012)   LUMBAR LAMINECTOMY/DECOMPRESSION MICRODISCECTOMY N/A 11/09/2012   Procedure: L3-L4 DECOMPRESSION AND MICRODISCECTOMY   (1 LEVEL);  Surgeon: Venita Lickahari Brooks, MD;  [facility];  Service: Orthopedics;  Laterality: N/A;     Current Outpatient Medications  Medication Sig Dispense Refill   acetaminophen (TYLENOL) 500 MG tablet Take 500-1,000 mg by mouth every 6 (six) hours as needed (pain or headaches).     apixaban (ELIQUIS) 5 MG TABS tablet Take 1 tablet (5 mg total) by mouth 2 (two) times daily. 180 tablet 1   atorvastatin (LIPITOR) 80 MG tablet Take 1 tablet (80 mg total) by mouth daily at 6 PM. 90 tablet 2   cholecalciferol (VITAMIN D3) 25 MCG (1000 UNIT) tablet Take 1,000 Units by mouth daily.      diltiazem (CARDIZEM CD) 120 MG 24 hr capsule TAKE 1 CAPSULE BY MOUTH EVERY DAY 90 capsule 1   ezetimibe (ZETIA) 10 MG tablet TAKE 1 TABLET BY MOUTH EVERY DAY 90 tablet 3   fluticasone (FLONASE) 50 MCG/ACT nasal spray PLACE 2 SPRAYS IN BOTH NOSTRILS DAILY AS NEEDED FOR ALLERGIES OR RHINITIS 48 mL 1   furosemide (LASIX) 40 MG tablet DAILY FOR 3 DAYS AND THEN AS DIRECTED 20 tablet 1   hydrOXYzine (ATARAX/VISTARIL) 25 MG tablet Take 1 tablet (25 mg total) by mouth 3 (three) times daily as needed. 30 tablet 0   levocetirizine (XYZAL) 5 MG tablet TAKE 1 TABLET BY MOUTH EVERY DAY 90 tablet 1   Magnesium 250 MG TABS TAKE 1 TABLET BY MOUTH DAILY WITH EVENING MEALS 90 tablet 2    Olmesartan-amLODIPine-HCTZ 20-5-12.5 MG TABS TAKE 1 TABLET BY MOUTH EVERY DAY 90 tablet 1   topiramate (TOPAMAX) 100 MG tablet Take 1 tablet (100 mg total) by mouth at bedtime. 90 tablet 4   No current facility-administered medications for this visit.    Allergies:   Other    Social History:  The patient  reports that she has never smoked. She has never used smokeless tobacco. She reports current alcohol use of about 7.0 standard drinks per week. She reports that she does not use drugs.   Family History:  The patient's family history includes CAD in her brother and mother; Heart attack in  her sister; Heart disease in her mother and sister; Liver disease in her brother; Other in her father; Stroke in her mother.    ROS:  Please see the history of present illness. (+) Left hip pain All other systems are reviewed and negative.    PHYSICAL EXAM: VS:  BP 116/72   Pulse 84   Ht 5\' 1"  (1.549 m)   Wt 195 lb 14.4 oz (88.9 kg)   LMP 06/03/2011   BMI 37.01 kg/m  , BMI Body mass index is 37.01 kg/m. GENERAL:  Well appearing HEENT: Pupils equal round and reactive, fundi not visualized, oral mucosa unremarkable NECK:  No jugular venous distention, waveform within normal limits, +R carotid bruit LYMPHATICS:  No cervical adenopathy LUNGS:  Clear to auscultation bilaterally HEART:  RRR.  PMI not displaced or sustained,S1 and S2 within normal limits, no S3, no S4, no clicks, no rubs, no murmurs ABD:  Flat, positive bowel sounds normal in frequency in pitch, no bruits, no rebound, no guarding, no midline pulsatile mass, no hepatomegaly, no splenomegaly EXT:  2 plus pulses throughout, no edema, no cyanosis no clubbing SKIN:  No rashes no nodules NEURO:  Cranial nerves II through XII grossly intact, motor grossly intact throughout PSYCH:  Cognitively intact, oriented to person place and time   EKG:   09/12/2020: Sinus rhythm. Rate 84 bpm. Nonspecific T wave abnormalities. 06/30/16: sinus rhythm.  Rate 78 bpm.   Echo 03/17/2020: 1. Left ventricular ejection fraction, by estimation, is 60 to 65%. The  left ventricle has normal function. The left ventricle has no regional  wall motion abnormalities. There is mild concentric left ventricular  hypertrophy. Left ventricular diastolic  parameters are consistent with Grade I diastolic dysfunction (impaired  relaxation).   2. Right ventricular systolic function is normal. The right ventricular  size is mildly enlarged. There is mildly elevated pulmonary artery  systolic pressure. The estimated right ventricular systolic pressure is  36.4 mmHg.   3. The mitral valve is grossly normal. Mild mitral valve regurgitation.   4. Tricuspid valve regurgitation is mild to moderate.   5. There is flow acceleration noted in the LVOT without evidence of SAM.  Likely related to LVOT narrowing during systole in the setting of septal  hypertrophy. Flow acceleration occurs prior to the aortic valve with no  evidence of aortic stenosis. DI  0.7.   6. The aortic valve is tricuspid. There is mild calcification of the  aortic valve. There is mild thickening of the aortic valve. Aortic valve  regurgitation is mild. Mild aortic valve sclerosis is present, with no  evidence of aortic valve stenosis.   7. The inferior vena cava is normal in size with greater than 50%  respiratory variability, suggesting right atrial pressure of 3 mmHg.   Comparison(s): Compared to prior TTE in 08/08/19, the flow acceleration  across the LVOT and degree of TR is better captured on the current study.  Otherwise, there is no significant change.   Echo 08/08/2019:  1. Left ventricular ejection fraction, by estimation, is 60 to 65%. The  left ventricle has normal function. The left ventricle has no regional  wall motion abnormalities. Left ventricular diastolic parameters are  consistent with Grade I diastolic  dysfunction (impaired relaxation).   2. Right ventricular systolic  function is normal. The right ventricular  size is normal. There is moderately elevated pulmonary artery systolic  pressure.   3. The mitral valve is normal in structure. Trivial mitral valve  regurgitation. No  evidence of mitral stenosis.   4. The aortic valve is normal in structure. Aortic valve regurgitation is  mild. No aortic stenosis is present.   5. The inferior vena cava is dilated in size with >50% respiratory  variability, suggesting right atrial pressure of 8 mmHg.   Carotid Arterial Duplex 06/27/2019: Summary:  Right Carotid: Velocities in the right ICA are consistent with a 80-99%                 stenosis.   Left Carotid: Velocities in the left ICA are consistent with a 40-59%  stenosis.   Vertebrals:  Bilateral vertebral arteries demonstrate antegrade flow.  Subclavians: Left subclavian artery was stenotic. Normal flow hemodynamics were seen in the right subclavian artery.  LHC 07/12/18: CULPRIT LESION: Mid Cx lesion is 85% stenosed with 30-40% stenosed side branch in 2nd Mrg. A drug-eluting stent was successfully placed (crossing OM2) using a STENT SYNERGY DES 3X20 - post-dilated to 3.3 mm Post intervention, there is a 0% residual stenosis. ----------------------------------------- Mid LAD lesion is 30% stenosed. Prox RCA lesion is 25% stenosed. ----------------------------------------- The left ventricular systolic function is normal. The left ventricular ejection fraction is 55-65% by visual estimate. LV end diastolic pressure is normal.   SUMMARY Severe Single Vessel CAD mLCX (@ OM2) - s/p Successful DES PCI (Synergy DES 3.0 x 20 -- 3.3 mm) Otherwise mild disease in LAD & RCA Normal LVEF & normal to low EDP   RECOMMENDATIONS Overnight monitoring post-PCI with TR Band Removal  Continue aggressive Risk Factor Modification  ETT 07/22/16: Blood pressure demonstrated a normal response to exercise. Clinically negative Electrically positive for ischemia with  significant ST changes beginning in Stage II. Excellent exercise tolerance  Echo 07/13/16: Study Conclusions   - Left ventricle: The cavity size was normal. Wall thickness was   normal. Systolic function was vigorous. The estimated ejection   fraction was in the range of 65% to 70%. Wall motion was normal;   there were no regional wall motion abnormalities. Features are   consistent with a pseudonormal left ventricular filling pattern,   with concomitant abnormal relaxation and increased filling   pressure (grade 2 diastolic dysfunction). - Mitral valve: There was mild regurgitation. - Pulmonary arteries: Systolic pressure was moderately increased.   PA peak pressure: 48 mm Hg (S).  Recent Labs: 03/11/2020: ALT 21 03/17/2020: TSH 1.978 03/18/2020: Hemoglobin 11.0; Magnesium 2.0; Platelets 247 06/26/2020: BNP 32.2; BUN 11; Creatinine, Ser 0.89; Potassium 3.9; Sodium 141   06/17/16: TSH 2.7 Hemoglobin A1c 5.9 Total cholesterol 219, triglycerides 83, HDL 67, LDL 139  Lipid Panel    Component Value Date/Time   CHOL 90 03/17/2020 0115   CHOL 140 04/04/2019 0820   TRIG 42 03/17/2020 0115   HDL 41 03/17/2020 0115   HDL 46 04/04/2019 0820   CHOLHDL 2.2 03/17/2020 0115   VLDL 8 03/17/2020 0115   LDLCALC 41 03/17/2020 0115   LDLCALC 80 04/04/2019 0820     Wt Readings from Last 3 Encounters:  09/12/20 195 lb 14.4 oz (88.9 kg)  09/09/20 195 lb 8 oz (88.7 kg)  07/22/20 197 lb 12.8 oz (89.7 kg)      ASSESSMENT AND PLAN: Essential hypertension BP well-controlled.  Continue olmesartan/amlodipine/HCTZ.  Paroxysmal atrial fibrillation (HCC) Currently in sinus rhythm.  Continue Eliquis.  Bilateral carotid artery stenosis 100% R ICA stenosis and 60-70% L ICA stenosis on CT 02/2020.  Carotid Dopplers are pending with neurology, as there was concern that may be overestimated due to  blooming artifact.  She was reminded that she is due for follow up with vascular surgery.  Continue Eliquis and  statin.  She understands that she cannot stop all blood thinners pre-operatively.  OSA (obstructive sleep apnea) Continue CPAP  Preoperative evaluation of a medical condition to rule out surgical contraindications (TAR required) [name] does not have any unstable cardiac symptoms.  She does have a family history of prolonged QTC.  Her QT is not prolonged.  Would recommend avoiding QT prolonging agents with anesthesia if at all possible.  She also has known complete ICA stenosis on the right and at least moderate on the left.  Repeat carotid Dopplers are pending and she knows that she needs to follow-up with vascular surgery.  Given her atrial fibrillation and obstructive carotid disease, I agree with her neurologist that she would be better bridged and to hold all blood thinners preoperatively.  We will arrange for her to see our pharmacist and do Lovenox shots for 5 days prior to surgery.  She is able to achieve 4 METS of activity and remain limitation is her hip.  Therefore, no repeat ischemic testing is indicated at this time.  Her PCI was over a year ago, and she had otherwise mild disease except for the left circumflex which was stented.  Current medicines are reviewed at length with the patient today.  The patient does not have concerns regarding medicines.  The following changes have been made: none  Labs/ tests ordered today include:   Orders Placed This Encounter  Procedures   EKG 12-Lead     Disposition:   FU with Caide Campi C. Duke Salvia, MD, [facility] in 6 months  I,Mathew Stumpf,acting as a Neurosurgeon for Chilton Si, MD.,have documented all relevant documentation on the behalf of Chilton Si, MD,as directed by  Chilton Si, MD while in the presence of Chilton Si, MD.  I, Conor Filsaime C. Duke Salvia, MD have reviewed all documentation for this visit.  The documentation of the exam, diagnosis, procedures, and orders on 09/12/2020 are all accurate and  complete.   Signed, Haruko Mersch C. Duke Salvia, MD, [facility]  09/12/2020 12:58 PM    [facility]

## 2020-09-16 ENCOUNTER — Telehealth: Payer: Self-pay | Admitting: Neurology

## 2020-09-16 ENCOUNTER — Ambulatory Visit (INDEPENDENT_AMBULATORY_CARE_PROVIDER_SITE_OTHER): Payer: 59 | Admitting: Neurology

## 2020-09-16 ENCOUNTER — Other Ambulatory Visit: Payer: Self-pay

## 2020-09-16 VITALS — BP 133/79 | HR 85 | Ht 61.0 in | Wt 200.5 lb

## 2020-09-16 DIAGNOSIS — I609 Nontraumatic subarachnoid hemorrhage, unspecified: Secondary | ICD-10-CM

## 2020-09-16 DIAGNOSIS — R0683 Snoring: Secondary | ICD-10-CM

## 2020-09-16 DIAGNOSIS — G4733 Obstructive sleep apnea (adult) (pediatric): Secondary | ICD-10-CM

## 2020-09-16 DIAGNOSIS — I4891 Unspecified atrial fibrillation: Secondary | ICD-10-CM | POA: Diagnosis not present

## 2020-09-16 DIAGNOSIS — G43109 Migraine with aura, not intractable, without status migrainosus: Secondary | ICD-10-CM

## 2020-09-16 NOTE — Progress Notes (Signed)
SLEEP MEDICINE CLINIC    Provider:  Larey Seat, MD  Primary Care Physician:  Glendale Chard, MD [address] [place] [address]     Referring Provider: Skeet Latch, MD,  [facility]   Dr. Marcial Pacas is her primary ( and stroke) neurologist.  The patient is seen here in sleep medicine consultation.            Chief Complaint according to patient   Patient presents with:     New Patient (Initial Visit)           HISTORY OF PRESENT ILLNESS:  [name] is a 60 year- old  African American female patient and seen here upon referral on 09/16/2020 for a sleep medicine consultation- the patient has a primary neurologist, Dr. Krista Blue, who followed her for a stroke in 2021.  She saw Dr. Krista Blue on 09-09-20,  The patient underwent a attended attended sleep study to this was on 5-19 2022 she was first diagnosed with sleep apnea 144 centrals or 844 obstructive apneas, 1 central apnea 2 mixed apneas and 23 hypopneas, AHI was 30.6 and in rem sleep not much accentuated to 32.7.  Supine was her main risk factor in supine sleep her AHI went from 95.7 in nonsupine it was 9.5 so staying off her back is important but also to use CPAP.  Her auto titration CPAP is not available today she will bring it back at a later time since she has a machine that has a memory chip we will have to load the machine.  She feels that she can use the I Gwyneth Revels- that she would like to also talk about how the machine works for her. She uses a FFM, she was told that's what she needs. She has nasal congestion-  Medium size classic FFM-  she has a concern about a lot of air leaks,  eye irritation, and she feels otherwise the headgear gets to tight. Lets switch to a FFM  F 30 I.         Chief concern according to patient :   Presents today for sleep eval. Had a PSG and [place] [facility]  in 2014 and was dx with OSA. Started CPAP 01/23/13 and used only a short amt of  time.  She recently had a SAH and a stroke and dx with A Fib ( MARCH 2022)  and Dr Oval Linsey had requested her to readdress OSA.   [name]  has a past medical history of Allergy, Arthritis, Atypical chest pain (06/30/2016), Back pain, Bruises easily, Chronic lower back pain, STROKE- SAH ( Dr Krista Blue ) , Migraines, untreated Obstructive Sleep Apnea ( Dr Claiborne Billings) , known Coronary artery disease, Depression, Headache, History of bronchitis, Hypertension, Hypothyroidism (06/30/2016), Pure hypercholesterolemia (05/31/2019), Vitamin D deficiency. Severus Brodzinski<MD : The patient is referred today on 04-28-2020 based on that home health care note which was a virtual visit by telephone on 3-16 2022 stating that the patient has a history of essential hypertension bilateral carotid artery stenosis, acute CVA, atrial fibrillation with rapid ventricular response, pulmonary hypertension, subarachnoid hemorrhage, hyperlipidemia, hypokalemia.  She presented to the emergency department on 03/16/2020 was discharged on 3 8 she had increased chest pressure upon arrival was found to be in atrial fibrillation with RVP had noted increased shortness of breath blood pressure was 140s over 100 but the heart rate was 153 bpm.  Troponins were normal range, she was started on Cardizem  and a bolus with normal saline then was transitioned to diltiazem she was evaluated by Dr. Skeet Latch.  She had stated that she drinks caffeinated beverages.  She transitioned from atrial fibrillation to normal sinus rhythm, echocardiogram was performed and showed a preserved ejection fraction.  Heparin was transitioned to Eliquis.  And she reported virtually in her visit that she was feeling much better she had changed from a more stressful job to a less stressful job in the meantime and this also allowed her to be more relaxed and less likely to be under as much pressure.  She was asked to increase her physical activity avoid triggers and also asked to readdress  her sleep apnea.    The patient had the first sleep study in the year 2014  with a result of an AHI ( Apnea Hypopnea index)  of the study was from 12-16 2014, at the time the BMI was 47 the age of the patient was 52 sleep onset was recorded at 9 minutes, REM latency was 77 minutes, heart rate varied between 89 and 63 bpm which is not abnormal, there were few periodic limb movements.  It is remarkable that I do not see the baseline AHI it was diagnosed at 9.4 overall and at 18.5 during REM sleep.  So this was not a baseline study the 12/26/2012 study was a CPAP titration.  This patient had mild apnea and it was CPAP that was titrated to 4 cm and increased to 8 cmH2O a CPAP was ordered for that setting of 8 cmH2O- actually,  no mask was recommended.  HISTORY OF PRESENT ILLNESS: [name] is a 60 year old female, seen in request by her primary care physician Dr. Baird Cancer, Bailey Mech for episode of visual loss, headaches, initial evaluation was on August 23, 2018.   I have reviewed and summarized the referring note from the referring physician.  She had past medical history of hyperlipidemia, hypertension, coronary artery disease, status post stent on July 12, 2018, currently taking aspirin and Plavix   She denies a previous history of migraine headaches, on August 18, 2018, while sitting at the computer, she noticed difficulty reading, then bilateral vision went out, she was able to lie down with eyes closed resting for 30 minutes, her vision came back normal, at the same time she noticed mild left temporal region throbbing headaches, when she went back to look at computer again, she experienced visual loss bilaterally again, was sent to the emergency room   I personally reviewed MRI of the brain no acute abnormality   MRA of neck: 50% stenosis of left internal carotid artery, critical stenosis of proximal right internal carotid artery, estimated to be greater than 80% stenosis, 50% stenosis of the left  internal carotid artery   MRA of the brain showed no significant large vessel stenosis intracranially She experienced a severe headache on August 08/2018, severe holoacranial pounding headache with associated light noise sensitivity, nauseous that was helped by Tylenol Laboratory evaluations in 2020, LDL 148, cholesterol 217, negative C-reactive protein, ESR was mildly elevated 50, UDS was negative, normal TSH, A1c 5.9    Dr Krista Blue : June 21 2019: Her headache has much improved, tolerating Topamax 100 mg every night She remained on aspirin and Plavix, she did not have ultrasound of carotid artery to follow-up her severe right carotid artery stenosis, more than 80% based on MRA in 2020     Family medical /sleep history: No other family member on CPAP with OSA, insomnia,  sleep walkers.    Social history: Patient is working at [employer] and recently switched jobs to social services and less under stress.  She  lives in a household  Alone, not pets, adult child , 3 grandchildren.  The patient currently daytime -works regular office hours.  Tobacco use/.  ETOH use : wine 1-2  At night,  Caffeine intake in form of Coffee( / Soda( /) Tea (/) no energy drinks. Regular exercise in form of walking.    Sleep habits are as follows: The patient's dinner time is between 5-6.30 PM.  The patient goes to bed at 10 PM and continues to sleep for 3-4 hours, wakes for 1-2 bathroom breaks, gets a total of 7 hours of sleep. .   The preferred sleep position is variable - restless , due to hip pain- with the support of 1-2 pillows.  Dreams are reportedly infrequent/vivid.  7.30  AM is the usual rise time. The patient wakes up with an alarm set at 7 AM .  She reports not feeling refreshed or restored in AM, with symptoms such as dry mouth, no morning headaches , no night time headaches  But some residual fatigue. Naps are taken infrequently.  Review of Systems: Out of a complete 14 system review, the patient  complains of only the following symptoms, and all other reviewed systems are negative.:  Fatigue, migraines.  Daytime sleepiness , loud snoring, fragmented sleep by nocturia.  Nocturia now only once or none - since on CPAP . July 11 2020 was her first day of use.   DENIES SLEEPINESS< has had SAH, CVA and has paroxysmal atrial fib with RVR.    How likely are you to doze in the following situations: 0 = not likely, 1 = slight chance, 2 = moderate chance, 3 = high chance   Sitting and Reading? Watching Television? Sitting inactive in a public place (theater or meeting)? As a passenger in a car for an hour without a break? Lying down in the afternoon when circumstances permit? Sitting and talking to someone? Sitting quietly after lunch without alcohol? In a car, while stopped for a few minutes in traffic?   Total = 2/ 24 points   FSS endorsed at  12 on CPAP down from 27/ 63 points.   Social History   Socioeconomic History   Marital status: Divorced    Spouse name: Not on file   Number of children: 1   Years of education: Not on file   Highest education level: Some college, no degree  Occupational History   Occupation: Sports administrator  Tobacco Use   Smoking status: Never Smoker   Smokeless tobacco: Never Used  Scientific laboratory technician Use: Never used  Substance and Sexual Activity   Alcohol use: Yes    Alcohol/week: 7.0 standard drinks    Types: 7 Glasses of wine per week    Comment: social   Drug use: No   Sexual activity: Yes    Birth control/protection: Post-menopausal  Other Topics Concern   Not on file  Social History Narrative   Right-handed.   Occasional caffeine.   Lives alone.   Social Determinants of Health   Financial Resource Strain: Not on file  Food Insecurity: Not on file  Transportation Needs: Not on file  Physical Activity: Not possible with hip pain.   Stress: less since change of jobs in 03-2020  Social Connections: son and 3  grandchildren live near  Family History  Problem Relation Age of Onset   Heart disease Mother    CAD Mother    Stroke Mother    Heart disease Sister    Heart attack Sister    Liver disease Brother    Other Father        unknown medical history   CAD Brother    Colon cancer Neg Hx    Esophageal cancer Neg Hx    Rectal cancer Neg Hx    Stomach cancer Neg Hx     Past Medical History:  Diagnosis Date   Allergy    Arthritis    back    Atypical chest pain 06/30/2016   Back pain    DUE TO INJURY AT WORK ON05/2013   Bruises easily    Chronic lower back pain    Coronary artery disease    Depression    was on meds 2 yrs ago    Headache(784.0)    rarely   History of bronchitis    last time about 59yr ago    Hypertension    takes Benicar daily   Hypothyroidism 06/30/2016   OSA (obstructive sleep apnea) 04/28/2020   Preoperative evaluation of a medical condition to rule out surgical contraindications (TAR required) 09/12/2020   Pure hypercholesterolemia 05/31/2019   Vitamin D deficiency    takes Vitamin D 2 times/wk   Weakness    and numbness right foot    Past Surgical History:  Procedure Laterality Date   BACK SURGERY     CARPAL TUNNEL RELEASE Right    [place]  "took [place] fatty tumor" (11/09/2012)   IR ANGIO INTRA EXTRACRAN SEL COM CAROTID INNOMINATE BILAT MOD SED  08/10/2019   IR ANGIO VERTEBRAL SEL VERTEBRAL BILAT MOD SED  08/10/2019   IR [place] ACCESS RIGHT  08/10/2019   LEFT HEART CATH AND CORONARY ANGIOGRAPHY N/A 08/06/2016   Procedure: Left Heart Cath and Coronary Angiography;  Surgeon: SBelva Crome MD;  [facility];  Service: Cardiovascular;  Laterality: N/A;   LEFT HEART CATH AND CORONARY ANGIOGRAPHY N/A 07/12/2018   Procedure: LEFT HEART CATH AND CORONARY ANGIOGRAPHY;  Surgeon: HLeonie Man MD;  [facility];  Service: Cardiovascular;  Laterality: N/A;    LUMBAR LAMINECTOMY/DECOMPRESSION MICRODISCECTOMY  11/09/2012   "L3-4" (11/09/2012)   LUMBAR LAMINECTOMY/DECOMPRESSION MICRODISCECTOMY N/A 11/09/2012   Procedure: L3-L4 DECOMPRESSION AND MICRODISCECTOMY   (1 LEVEL);  Surgeon: DMelina Schools MD;  [facility]  Service: Orthopedics;  Laterality: N/A;     Current Outpatient Medications on File Prior to Visit  Medication Sig Dispense Refill   acetaminophen (TYLENOL) 500 MG tablet Take 500-1,000 mg by mouth every 6 (six) hours as needed (pain or headaches).     apixaban (ELIQUIS) 5 MG TABS tablet Take 1 tablet (5 mg total) by mouth 2 (two) times daily. 180 tablet 1   atorvastatin (LIPITOR) 80 MG tablet Take 1 tablet (80 mg total) by mouth daily at 6 PM. 90 tablet 2   cholecalciferol (VITAMIN D3) 25 MCG (1000 UNIT) tablet Take 1,000 Units by mouth daily.      diltiazem (CARDIZEM CD) 120 MG 24 hr capsule TAKE 1 CAPSULE BY MOUTH EVERY DAY 90 capsule 1   ezetimibe (ZETIA) 10 MG tablet TAKE 1 TABLET BY MOUTH EVERY DAY 90 tablet 3   fluticasone (FLONASE) 50 MCG/ACT nasal spray PLACE 2 SPRAYS  IN BOTH NOSTRILS DAILY AS NEEDED FOR ALLERGIES OR RHINITIS 48 mL 1   furosemide (LASIX) 40 MG tablet DAILY FOR 3 DAYS AND THEN AS DIRECTED 20 tablet 1   hydrOXYzine (ATARAX/VISTARIL) 25 MG tablet Take 1 tablet (25 mg total) by mouth 3 (three) times daily as needed. 30 tablet 0   levocetirizine (XYZAL) 5 MG tablet TAKE 1 TABLET BY MOUTH EVERY DAY 90 tablet 1   Magnesium 250 MG TABS TAKE 1 TABLET BY MOUTH DAILY WITH EVENING MEALS 90 tablet 2   Olmesartan-amLODIPine-HCTZ 20-5-12.5 MG TABS TAKE 1 TABLET BY MOUTH EVERY DAY 90 tablet 1   topiramate (TOPAMAX) 100 MG tablet Take 1 tablet (100 mg total) by mouth at bedtime. 90 tablet 4   No current facility-administered medications on file prior to visit.    Allergies  Allergen Reactions   Other Itching and Other (See Comments)    Patient experienced intense itching in her arm that was accessed for a past angiogram     Physical exam:  Today's Vitals   09/16/20 1301  BP: 133/79  Pulse: 85  SpO2: 98%  Weight: 200 lb 8 oz (90.9 kg)  Height: 5' 1"  (1.549 m)   Body mass index is 37.88 kg/m.   Wt Readings from Last 3 Encounters:  09/16/20 200 lb 8 oz (90.9 kg)  09/12/20 195 lb 14.4 oz (88.9 kg)  09/09/20 195 lb 8 oz (88.7 kg)     Ht Readings from Last 3 Encounters:  09/16/20 5' 1"  (1.549 m)  09/12/20 5' 1"  (1.549 m)  09/09/20 5' 1"  (1.549 m)      General: The patient is awake, alert and appears not in acute distress.  The patient is well groomed. Head: Normocephalic, atraumatic. Neck is supple. Mallampati 2,  neck circumference:16.5 inches . Nasal airflow patent.  Retrognathia is mild.(  She should not use a FFM , though) Dental status: partial dentures.  Cardiovascular:  Regular rate and cardiac rhythm by pulse,  without distended neck veins. Respiratory: Lungs are clear to auscultation.  Skin:  Without evidence of ankle edema, or rash. Trunk: The patient's posture is erect.   Neurologic exam : The patient is awake and alert, oriented to place and time.   Memory subjective described as intact.  Attention span & concentration ability appears normal.  Speech is fluent,  without  dysarthria, dysphonia or aphasia.  Mood and affect are appropriate.   Cranial nerves: no loss of smell or taste reported  Pupils are equal and briskly reactive to light. Funduscopic exam deferred. .  Extraocular movements in vertical and horizontal planes were intact and without nystagmus.  No Diplopia. Visual fields by finger perimetry are intact. Hearing was intact to soft voice and finger rubbing.   Facial sensation intact to fine touch.  Facial motor strength is symmetric and tongue and uvula move midline.  Neck ROM : rotation, tilt and flexion extension were normal for age and shoulder shrug was symmetrical.    Motor exam:  Symmetric bulk, tone and ROM.   Normal tone without cog -wheeling, symmetric  grip strength .   Sensory:  Fine touch and vibration were tested  and  normal.  Proprioception tested in the upper extremities was normal.   Coordination: Rapid alternating movements in the fingers/hands were of normal speed. Normal penmanship-  The Finger-to-nose maneuver was intact .   Gait and station: Patient could rise unassisted from a seated position, walked without assistive device.   Toe and heel walk were  deferred.  Deep tendon reflexes: in the  upper and lower extremities are symmetric and level 1  Babinski response was deferred.      After spending a total time of  46 minutes face to face and additional time for physical and neurologic examination, review of laboratory studies,  personal review of imaging studies, reports and results of other testing and review of referral information / records as far as provided in visit, I have established the following assessments:  60 y.o. year old AA female with history of 07-2019 CVA, Stroke, and now a new ( 03-25-2020) [place] after antiplatelet therapy.  In the setting of Bilateral carotid artery stenosis/Total occlusion of right internal carotid artery History of right MCA stroke also subarachnoid hemorrhage in the setting of aspirin and Plavix  She is anticoagulated on Eloquis.  Atrial fibrillation with RVR was found in 03-25-2020, Dr Oval Linsey did Echo- normal EF.  Atrial fib may have been caffeine induced    Headache with migraine features/ Much improved, not affecting sleep . Dr Krista Blue advised to hydrate well and keep taking Topamax 100 mg at bedtime    My Plan is to proceed with:  1) I will order a sleep study with cardiac rhythm observation, this would be an attended sleep study or this patient with recent [place] and atrial fib onset.  2) if not permitted by insurance , will do HST and follow with autotitration. Unfortunately, that would not bode well for future compliance. I need this patient to be coached, her mask to be fitted ,   3)  lower caffeine, alcohol and cholesterol intake.   I would like to thank Skeet Latch, MD @ [facility].  PCP : Glendale Chard, [address],  [place] [address] for allowing me to meet with and to take care of this pleasant patient.   In short, [name] is presenting with new onset atrial fib. And diagnosed with severe sleep apnea- now on autp PAP, but forgot her machine today.   She will deliver the machine for a download of data later this week and I will then bill for this visit.  She was told to avoid supine sleep- can use the [place]    I plan to follow up either personally or through our NP within 3 month.   CC: I will share my notes with PCP and primary neurologist.   Electronically signed by: Larey Seat, MD 09/16/2020 1:16 PM  [facility] [place] certified by The [employer] of Sleep Medicine and Diplomate of the [employer] of Sleep Medicine. Board certified In Neurology through the [place], Fellow of the [employer] of Neurology. Medical Director of [employer].

## 2020-09-16 NOTE — Progress Notes (Signed)
CM sent to [facility] 

## 2020-09-16 NOTE — Telephone Encounter (Signed)
Ladies patient was here last week for a visit and asked topormax be refilled ut it wasn't sent to the pharmacy. Can this be done today? She is here for a visit and asked me to send a message. Thank you

## 2020-09-16 NOTE — Telephone Encounter (Signed)
90-day x 3 refills sent on 09/09/20. I spoke to the patient and she is aware. She will check w/ [facility].

## 2020-09-19 ENCOUNTER — Telehealth: Payer: Self-pay | Admitting: Neurology

## 2020-09-19 NOTE — Telephone Encounter (Signed)
[employer] Auth: NPR spoke to [facility] ref # [id number] UHC auth: NPR spoke to [facility] Ref # [id number]. Sent a message to Lupita Leash she will reach out to the patient to schedule.

## 2020-09-21 ENCOUNTER — Other Ambulatory Visit: Payer: Self-pay | Admitting: Internal Medicine

## 2020-09-21 DIAGNOSIS — I6522 Occlusion and stenosis of left carotid artery: Secondary | ICD-10-CM

## 2020-09-23 ENCOUNTER — Other Ambulatory Visit: Payer: Self-pay

## 2020-09-23 ENCOUNTER — Ambulatory Visit (HOSPITAL_COMMUNITY)
Admission: RE | Admit: 2020-09-23 | Discharge: 2020-09-23 | Disposition: A | Discharge status: Home / Self Care | Payer: 59 | Source: Ambulatory Visit | Attending: Neurology | Admitting: Neurology

## 2020-09-23 ENCOUNTER — Telehealth: Payer: Self-pay | Admitting: Neurology

## 2020-09-23 DIAGNOSIS — I6523 Occlusion and stenosis of bilateral carotid arteries: Secondary | ICD-10-CM

## 2020-09-23 DIAGNOSIS — I639 Cerebral infarction, unspecified: Secondary | ICD-10-CM | POA: Diagnosis not present

## 2020-09-23 DIAGNOSIS — I4891 Unspecified atrial fibrillation: Secondary | ICD-10-CM | POA: Diagnosis not present

## 2020-09-23 DIAGNOSIS — I6522 Occlusion and stenosis of left carotid artery: Secondary | ICD-10-CM | POA: Insufficient documentation

## 2020-09-23 DIAGNOSIS — I609 Nontraumatic subarachnoid hemorrhage, unspecified: Secondary | ICD-10-CM

## 2020-09-23 NOTE — Addendum Note (Signed)
Addended by: Lindell Spar C on: 09/23/2020 03:50 PM   Modules accepted: Orders, Level of Service, SmartSet

## 2020-09-23 NOTE — Progress Notes (Signed)
Carotid duplex has been completed.   Preliminary results in CV Proc.   [name] 09/23/2020 10:26 AM

## 2020-09-23 NOTE — Telephone Encounter (Signed)
Summary Sep 23 2020:  Right Carotid: Evidence consistent with a total occlusion of the right  ICA.   Left Carotid: Velocities in the left ICA are consistent with a 60-79%  stenosis.   Vertebrals: Bilateral vertebral arteries demonstrate antegrade flow. __________________________ Summary: June 2021 Right Carotid: Velocities in the right ICA are consistent with a 80-99%                 stenosis.   Left Carotid: Velocities in the left ICA are consistent with a 40-59%  stenosis.   Vertebrals:  Bilateral vertebral arteries demonstrate antegrade flow.  Subclavians: Left subclavian artery was stenotic. Normal flow   I have called neurovascular specialist Dr. Roda Shutters, recommended vascular surgery referral for potential left internal carotid artery high-grade stenosis intervention,  I have called patient above result and plan, she agrees to proceed.

## 2020-09-23 NOTE — Telephone Encounter (Signed)
Megan from vascular lab called with critical results, Right ICA is occluded and Left is 60-79% occluded.    Message sent to Dr. Terrace Arabia with high priority

## 2020-09-23 NOTE — Telephone Encounter (Signed)
error 

## 2020-09-23 NOTE — Telephone Encounter (Deleted)
The original result note has been redacted due to error. °

## 2020-09-23 NOTE — Telephone Encounter (Addendum)
error 

## 2020-09-29 ENCOUNTER — Telehealth: Payer: Self-pay | Admitting: Neurology

## 2020-09-29 ENCOUNTER — Telehealth (HOSPITAL_COMMUNITY): Payer: Self-pay

## 2020-09-29 ENCOUNTER — Other Ambulatory Visit (HOSPITAL_COMMUNITY): Payer: Self-pay | Admitting: Interventional Radiology

## 2020-09-29 ENCOUNTER — Telehealth: Payer: Self-pay | Admitting: Cardiovascular Disease

## 2020-09-29 DIAGNOSIS — I771 Stricture of artery: Secondary | ICD-10-CM

## 2020-09-29 NOTE — Telephone Encounter (Signed)
I returned the call to [facility] at Dr. Gaylyn Rong office. The patient is already under his care. He is going to schedule an angiogram.

## 2020-09-29 NOTE — Telephone Encounter (Signed)
Called to schedule diagnostic angiogram, no answer, left vm. AW  

## 2020-09-29 NOTE — Telephone Encounter (Signed)
Patient with diagnosis of atrial fibrillation on Eliquis for anticoagulation.    Procedure: diagnostic angiogram Date of procedure: 10/07/20   CHA2DS2-VASc Score = 5   This indicates a 7.2% annual risk of stroke. The patient's score is based upon: CHF History: 0 HTN History: 1 Diabetes History: 0 Stroke History: 2 Vascular Disease History: 1 Age Score: 0 Gender Score: 1   Stroke of MCA July 2021.  It was also noted that patient had subdural hematoma on scan.  (08/07/19)  CrCl 69 (with adjusted body weight) Platelet count 247  Would recommend that patient is okay to hold Eliquis for 1 day prior to procedure.  If more than one day is needed, will need to defer to Dr. Duke Salvia to determine if Lovenox bridge is required.    Patient should re-start Eliquis on the evening of or day after procedure, as recommended by MD

## 2020-09-29 NOTE — Telephone Encounter (Signed)
   [facility] Pre-operative Risk Assessment    Request for surgical clearance:  What type of surgery is being performed?  Diagnostic Angiogram    When is this surgery scheduled?  10/07/20   What type of clearance is required (medical clearance vs. Pharmacy clearance to hold med vs. Both)?  Pharmacy   Are there any medications that need to be held prior to surgery and how long? Eliquis, their office would like our recommendation on how long to hold it   Practice name and name of physician performing surgery?  Dr. Arlean Hopping Office   What is your office phone number? [phone]   7.   What is your office fax number? [phone]  8.   Anesthesia type (None, local, MAC, general) ?  Moderate sedation    Zara Council 09/29/2020, 2:38 PM

## 2020-09-29 NOTE — Telephone Encounter (Signed)
Received a call from [facility] at Dr. Fatima Sanger office regarding this patient. She can be reached at [phone] and is expecting a call back.

## 2020-09-30 NOTE — Telephone Encounter (Signed)
Will route to callback team to find out if surgeon is OK with one day hold. If so, we can finalize clearance. (Patient was just recently seen by Dr. Duke Salvia for unrelated procedure and cleared to proceed without ischemic testing.) If they require longer than 1 day, please route reply to P [facility] PREOP PHARM and Dr. Duke Salvia as she may require Lovenox bridge. Thank you.

## 2020-09-30 NOTE — Telephone Encounter (Signed)
S/w Morrie Sheldon with Dr. Percell Locus office. I went over the recommendations that were given by pre op provider and pharm-d in regards to hold Eliquis x 1 day. If needing to hold longer than 1 days will need to have Dr. Duke Salvia and pharm-d review as the pt may need to be set up on Lovenox. Morrie Sheldon, stated she will d/w Dr. Drusilla Kanner and will call back and let our office know. I asked for her to ask to s/w the pre op team.

## 2020-10-01 NOTE — Telephone Encounter (Signed)
   Patient Name: [name]  DOB: 21-Oct-1960 MRN: [id number]  Primary Cardiologist: Chilton Si, MD  Chart reviewed as part of pre-operative protocol coverage. Patient was just recently seen by Dr. Duke Salvia 09/12/20 for unrelated procedure and cleared to proceed without ischemic testing, therefore we can apply that clearance to this procedure as well.   Pharmacy team has stated that patient may hold Eliquis for 1 day prior to procedure.  If more than one day was needed, we would need to defer to Dr. Duke Salvia to determine if Lovenox bridge is required.  As per notes below, Dr. Corliss Skains was OK with holding the patient's Eliquis for 1 day. Per pharmD, patient should re-start Eliquis on the evening of or day after procedure, as recommended by MD.  Will route this bundled recommendation to requesting provider via Epic fax function. Please call with questions.   Laurann Montana, PA-C 10/01/2020, 2:49 PM

## 2020-10-01 NOTE — Telephone Encounter (Signed)
   [name]  Dr. Fatima Sanger Office returning call, she said per Dr. Corliss Skains is ok holding pt's eliquis for 1 day

## 2020-10-02 ENCOUNTER — Other Ambulatory Visit: Payer: Self-pay | Admitting: Internal Medicine

## 2020-10-02 DIAGNOSIS — I10 Essential (primary) hypertension: Secondary | ICD-10-CM

## 2020-10-03 ENCOUNTER — Other Ambulatory Visit: Payer: Self-pay | Admitting: Internal Medicine

## 2020-10-06 ENCOUNTER — Ambulatory Visit: Payer: Self-pay | Admitting: Neurology

## 2020-10-06 ENCOUNTER — Other Ambulatory Visit: Payer: Self-pay | Admitting: Radiology

## 2020-10-06 NOTE — H&P (Signed)
Chief Complaint: Patient was seen in consultation today for diagnostic cerebral angiogram with moderate sedation.  Referring Physician(s): Levert Feinstein  Supervising Physician: Julieanne Cotton  Patient Status: [facility] - Out-pt  History of Present Illness: [name] is a 60 y.o. female with a past medical history significant for OSA, CAD s/p stenting, a.fib (on Eliquis), HTN, HLD, CVA and bilateral carotid artery stenosis who presents today for a diagnostic cerebral angiogram with moderate sedation. [name] was first seen by [facility] 08/10/2019 after she presented to [facility] ED with acute onset right-sided headache, left facial droop and left sided weakness - she was found to have a right ICA occlusion and left ICA high grade stenosis. NIR was consulted for diagnostic cerebral angiogram which was performed on 7/30 and showed:  Angiographically occluded right internal carotid artery at the bulb without evidence of an angiographic string sign proximally.   Distal partial reconstitution of the right internal carotid artery cavernous and supraclinoid segment via the ipsilateral ophthalmic artery from the right external carotid artery branches.   Prompt retrograde opacification via the right posterior communicating artery of the distal right internal carotid artery and the right middle cerebral artery from the posterior circulation as described.   Approximately 30% stenosis of the left internal carotid artery at the bulb by the NASCET criteria. No evidence of acute ulcerations or of intraluminal filling defects is seen.   No gross evidence of vascular malformations, or of aneurysms or of dissection or of arteriovenous shunting.  She underwent CTA of the head/neck on 03/07/20 which again noted occlusion of the right ICA as well as 60-70% stenosis of the proximal left ICA. She presents today for a diagnostic cerebral angiogram to further evaluate these findings.  Vascular carotid US was  performed on 09/23/20 which showed evidence consistent with a total occlusion of the right ICA and velocities in the left ICA consistent with 60-79% stenosis as well as bilateral vertebral artery antegrade flow.  [name] denies any complaints today, she is waiting to get a hip replacement so has some trouble walking because of that. Last time she had this procedure she had intense "under the skin" itching from her right wrist to her right elbow that lasted for several days post procedure and required oral and topical medications form her PCP. She did not have itching or rash anywhere else on her body and did not have any facial or tongue swelling, wheezing, n/v. She understands the procedure today and is agreeable to proceed.  Past Medical History:  Diagnosis Date   Allergy    Arthritis    back    Atypical chest pain 06/30/2016   Back pain    DUE TO INJURY AT WORK ON05/2013   Bruises easily    Chronic lower back pain    Coronary artery disease    Depression    was on meds 2 yrs ago    Headache(784.0)    rarely   History of bronchitis    last time about 14yrs ago    Hypertension    takes Benicar daily   Hypothyroidism 06/30/2016   OSA (obstructive sleep apnea) 04/28/2020   Preoperative evaluation of a medical condition to rule out surgical contraindications (TAR required) 09/12/2020   Pure hypercholesterolemia 05/31/2019   Vitamin D deficiency    takes Vitamin D 2 times/wk   Weakness    and numbness right foot    Past Surgical History:  Procedure Laterality Date   BACK SURGERY  CARPAL TUNNEL RELEASE Right    CESAREAN SECTION  1988   COLONOSCOPY     EXPLORATORY LAPAROTOMY  1991   "took out fatty tumor" (11/09/2012)   IR ANGIO INTRA EXTRACRAN SEL COM CAROTID INNOMINATE BILAT MOD SED  08/10/2019   IR ANGIO VERTEBRAL SEL VERTEBRAL BILAT MOD SED  08/10/2019   IR US GUIDE VASC ACCESS RIGHT  08/10/2019   LEFT HEART CATH AND CORONARY ANGIOGRAPHY N/A 08/06/2016   Procedure: Left Heart  Cath and Coronary Angiography;  Surgeon: Lyn Records, MD;  Location: [facility] INVASIVE CV LAB;  Service: Cardiovascular;  Laterality: N/A;   LEFT HEART CATH AND CORONARY ANGIOGRAPHY N/A 07/12/2018   Procedure: LEFT HEART CATH AND CORONARY ANGIOGRAPHY;  Surgeon: Marykay Lex, MD;  Location: [facility] INVASIVE CV LAB;  Service: Cardiovascular;  Laterality: N/A;   LUMBAR LAMINECTOMY/DECOMPRESSION MICRODISCECTOMY  11/09/2012   "L3-4" (11/09/2012)   LUMBAR LAMINECTOMY/DECOMPRESSION MICRODISCECTOMY N/A 11/09/2012   Procedure: L3-L4 DECOMPRESSION AND MICRODISCECTOMY   (1 LEVEL);  Surgeon: Venita Lick, MD;  Location: [facility] OR;  Service: Orthopedics;  Laterality: N/A;    Allergies: Other  Medications: Prior to Admission medications   Medication Sig Start Date End Date Taking? Authorizing Provider  acetaminophen (TYLENOL) 500 MG tablet Take 500-1,000 mg by mouth every 6 (six) hours as needed (pain or headaches).   Yes [provider]  apixaban (ELIQUIS) 5 MG TABS tablet Take 1 tablet (5 mg total) by mouth 2 (two) times daily. 04/17/20  Yes Cleaver, Thomasene Ripple, NP  atorvastatin (LIPITOR) 80 MG tablet Take 1 tablet (80 mg total) by mouth daily at 6 PM. 09/18/19  Yes Dorothyann Peng, MD  cholecalciferol (VITAMIN D3) 25 MCG (1000 UNIT) tablet Take 1,000 Units by mouth daily.    Yes [provider]  diltiazem (CARDIZEM CD) 120 MG 24 hr capsule TAKE 1 CAPSULE BY MOUTH EVERY DAY 09/22/20  Yes Dorothyann Peng, MD  ezetimibe (ZETIA) 10 MG tablet TAKE 1 TABLET BY MOUTH EVERY DAY 11/02/19  Yes Chilton Si, MD  fluticasone (FLONASE) 50 MCG/ACT nasal spray PLACE 2 SPRAYS IN BOTH NOSTRILS DAILY AS NEEDED FOR ALLERGIES OR RHINITIS 07/31/20  Yes Dorothyann Peng, MD  furosemide (LASIX) 40 MG tablet DAILY FOR 3 DAYS AND THEN AS DIRECTED Patient taking differently: Take 40 mg by mouth daily as needed for fluid. 08/21/20  Yes Chilton Si, MD  hydrOXYzine (ATARAX/VISTARIL) 25 MG tablet Take 1 tablet (25 mg total)  by mouth 3 (three) times daily as needed. 08/21/19  Yes Arnette Felts, FNP  levocetirizine (XYZAL) 5 MG tablet TAKE 1 TABLET BY MOUTH EVERY DAY 05/15/20  Yes Ghumman, Ramandeep, NP  Magnesium 250 MG TABS TAKE 1 TABLET BY MOUTH DAILY WITH EVENING MEALS 03/11/20  Yes Dorothyann Peng, MD  Olmesartan-amLODIPine-HCTZ 20-5-12.5 MG TABS TAKE 1 TABLET BY MOUTH EVERY DAY 10/03/20  Yes Dorothyann Peng, MD  topiramate (TOPAMAX) 100 MG tablet Take 1 tablet (100 mg total) by mouth at bedtime. 09/09/20  Yes Levert Feinstein, MD     Family History  Problem Relation Age of Onset   Heart disease Mother    CAD Mother    Stroke Mother    Heart disease Sister    Heart attack Sister    Liver disease Brother    Other Father        unknown medical history   CAD Brother    Colon cancer Neg Hx    Esophageal cancer Neg Hx    Rectal cancer Neg Hx    Stomach  cancer Neg Hx     Social History   Socioeconomic History   Marital status: Divorced    Spouse name: Not on file   Number of children: 1   Years of education: Not on file   Highest education level: Some college, no degree  Occupational History   Occupation: Control and instrumentation engineer  Tobacco Use   Smoking status: Never   Smokeless tobacco: Never  Vaping Use   Vaping Use: Never used  Substance and Sexual Activity   Alcohol use: Yes    Alcohol/week: 7.0 standard drinks    Types: 7 Glasses of wine per week    Comment: social   Drug use: No   Sexual activity: Yes    Birth control/protection: Post-menopausal  Other Topics Concern   Not on file  Social History Narrative   Right-handed.   Occasional caffeine.   Lives alone.   Social Determinants of Health   Financial Resource Strain: Not on file  Food Insecurity: Not on file  Transportation Needs: Not on file  Physical Activity: Not on file  Stress: Not on file  Social Connections: Not on file     Review of Systems: A 12 point ROS discussed and pertinent positives are indicated in the HPI above.   All other systems are negative.  Review of Systems  Constitutional:  Negative for chills and fever.  HENT:  Negative for tinnitus and trouble swallowing.   Eyes:  Negative for photophobia and visual disturbance.  Respiratory:  Negative for cough and shortness of breath.   Cardiovascular:  Negative for chest pain.  Gastrointestinal:  Negative for abdominal pain, nausea and vomiting.  Musculoskeletal:  Positive for arthralgias (hip pain). Negative for back pain.  Skin:  Negative for rash.  Neurological:  Negative for dizziness, weakness, light-headedness, numbness and headaches.  Psychiatric/Behavioral:  Negative for confusion.    Vital Signs: BP 140/79   Pulse 78   Temp 98.1 F (36.7 C) (Oral)   Resp 16   Ht 5\' 1"  (1.549 m)   Wt 196 lb (88.9 kg)   LMP 06/03/2011   SpO2 100%   BMI 37.03 kg/m   Physical Exam Vitals reviewed.  Constitutional:      General: She is not in acute distress. HENT:     Head: Normocephalic.     Mouth/Throat:     Mouth: Mucous membranes are moist.     Pharynx: Oropharynx is clear. No oropharyngeal exudate or posterior oropharyngeal erythema.  Cardiovascular:     Rate and Rhythm: Normal rate and regular rhythm.  Pulmonary:     Effort: Pulmonary effort is normal.     Breath sounds: Normal breath sounds.  Abdominal:     General: There is no distension.     Palpations: Abdomen is soft.     Tenderness: There is no abdominal tenderness.  Skin:    General: Skin is warm and dry.     Findings: No rash.  Neurological:     Mental Status: She is alert and oriented to person, place, and time.  Psychiatric:        Mood and Affect: Mood normal.        Behavior: Behavior normal.        Thought Content: Thought content normal.        Judgment: Judgment normal.     MD Evaluation Airway: WNL Heart: WNL Abdomen: WNL Chest/ Lungs: WNL ASA  Classification: 2 Mallampati/Airway Score: Two   Imaging: VAS 06/05/2011 CAROTID  Result Date:  09/23/2020 Carotid  Arterial Duplex Study Patient Name:  [name]  Date of Exam:   09/23/2020 Medical Rec #: [id number]         Accession #:    [id number] Date of Birth: Dec 11, 1960         Patient Gender: F Patient Age:   5 years Exam Location:  [facility] Procedure:      VAS US CAROTID Referring Phys: Levert Feinstein --------------------------------------------------------------------------------  Indications:       Bilateral carotid artery stenosis. Risk Factors:      Hypertension, coronary artery disease. Comparison Study:  prior 06/27/19 Performing Technologist: Argentina Ponder RVS  Examination Guidelines: A complete evaluation includes B-mode imaging, spectral Doppler, color Doppler, and power Doppler as needed of all accessible portions of each vessel. Bilateral testing is considered an integral part of a complete examination. Limited examinations for reoccurring indications may be performed as noted.  Right Carotid Findings: +----------+--------+--------+--------+------------------+--------+           PSV cm/sEDV cm/sStenosisPlaque DescriptionComments +----------+--------+--------+--------+------------------+--------+ CCA Prox  125     11              heterogenous               +----------+--------+--------+--------+------------------+--------+ CCA Distal78      11              heterogenous               +----------+--------+--------+--------+------------------+--------+ ICA Prox                  Occluded                           +----------+--------+--------+--------+------------------+--------+ ICA Mid                   Occluded                           +----------+--------+--------+--------+------------------+--------+ ICA Distal                Occluded                           +----------+--------+--------+--------+------------------+--------+ ECA       179     12                                          +----------+--------+--------+--------+------------------+--------+ +----------+--------+-------+--------+-------------------+           PSV cm/sEDV cmsDescribeArm Pressure (mmHG) +----------+--------+-------+--------+-------------------+ [id number]                                        +----------+--------+-------+--------+-------------------+ +---------+--------+--+--------+--+---------+ VertebralPSV cm/s92EDV cm/s29Antegrade +---------+--------+--+--------+--+---------+  Left Carotid Findings: +---------+--------+-------+--------+---------------------------------+--------+          PSV cm/sEDV    StenosisPlaque Description               Comments                  cm/s                                                     +---------+--------+-------+--------+---------------------------------+--------+  CCA Prox 206     50             heterogenous                              +---------+--------+-------+--------+---------------------------------+--------+ CCA      98      32             heterogenous                              Distal                                                                    +---------+--------+-------+--------+---------------------------------+--------+ ICA Prox 200     70     60-79%  irregular, calcific and                                                   heterogenous                              +---------+--------+-------+--------+---------------------------------+--------+ ICA Mid  138     46                                                       +---------+--------+-------+--------+---------------------------------+--------+ ICA      98      52                                                       Distal                                                                    +---------+--------+-------+--------+---------------------------------+--------+ ECA      93      11                                                        +---------+--------+-------+--------+---------------------------------+--------+ +----------+--------+--------+--------+-------------------+           PSV cm/sEDV cm/sDescribeArm Pressure (mmHG) +----------+--------+--------+--------+-------------------+ [id number]                                         +----------+--------+--------+--------+-------------------+ +---------+--------+---+--------+--+---------+  VertebralPSV cm/s116EDV cm/s43Antegrade +---------+--------+---+--------+--+---------+   Summary: Right Carotid: Evidence consistent with a total occlusion of the right ICA. Left Carotid: Velocities in the left ICA are consistent with a 60-79% stenosis. Vertebrals: Bilateral vertebral arteries demonstrate antegrade flow. *See table(s) above for measurements and observations.  Electronically signed by Delia Heady MD on 09/23/2020 at 12:48:46 PM.    Final     Labs:  CBC: Recent Labs    03/16/20 1119 03/17/20 0115 03/17/20 0956 03/18/20 0205  WBC 8.9 8.4  --  8.5  HGB 12.8 11.2* 12.0 11.0*  HCT 39.6 36.0 37.1 35.2*  PLT 336 290  --  247    COAGS: No results for input(s): INR, APTT in the last 8760 hours.  BMP: Recent Labs    03/16/20 1119 03/17/20 0115 03/18/20 0205 06/26/20 1232  NA 139 138 139 141  K 4.4 3.2* 3.7 3.9  CL 107 108 109 104  CO2 22 21* 22 22  GLUCOSE 146* 143* 112* 101*  BUN CALCIUM 9.7 9.0 9.2 9.8  CREATININE 1.07* 0.98 0.87 0.89  GFRNONAA 59* >60 >60  --     LIVER FUNCTION TESTS: Recent Labs    03/11/20 1655  BILITOT 0.6  AST 19  ALT 21  ALKPHOS 87  PROT 7.6  ALBUMIN 4.7    TUMOR MARKERS: No results for input(s): AFPTM, CEA, CA199, CHROMGRNA in the last 8760 hours.  Assessment and Plan:  60 y/o F with history of CVA, right ICA stenosis who presents today for a diagnostic cerebral angiogram with moderate sedation to further evaluate CTA head/neck findings from  03/07/20.  Patient has been NPO since midnight. Afebrile, WBC 7.9, hgb 12.0, plt 319, BMP and INR pending. She is currently taking Eliquis 5 mg BID.  Will give Benadryl 50 mg PO x 1 due to previous prolonged itching at right radial site - will discuss possibly using alternative cleanser as the description of her rash would be most consistent with a reaction to the prep materials used (typically ChloraPrep with orange tint). Discussed slight increased chance of sedation and minor drop in BP, she is agreeable to this plan.  Risks and benefits of diagnostic cerebral angiogram were discussed with the patient including, but not limited to bleeding, infection, vascular injury, stroke, or contrast induced renal failure.  This interventional procedure involves the use of X-rays and because of the nature of the planned procedure, it is possible that we will have prolonged use of X-ray fluoroscopy. Potential radiation risks to you include (but are not limited to) the following: - A slightly elevated risk for cancer  several years later in life. This risk is typically less than 0.5% percent. This risk is low in comparison to the normal incidence of human cancer, which is 33% for women and 50% for men according to the [employer] Society. - Radiation induced injury can include skin redness, resembling a rash, tissue breakdown / ulcers and hair loss (which can be temporary or permanent).  The likelihood of either of these occurring depends on the difficulty of the procedure and whether you are sensitive to radiation due to previous procedures, disease, or genetic conditions.  IF your procedure requires a prolonged use of radiation, you will be notified and given written instructions for further action.  It is your responsibility to monitor the irradiated area for the 2 weeks following the procedure and to notify your physician if you are concerned that you have suffered a radiation induced injury.  All of the  patient's questions were answered, patient is agreeable to proceed.  Consent signed and in chart.  Thank you for this interesting consult.  I greatly enjoyed meeting [name] and look forward to participating in their care.  A copy of this report was sent to the requesting provider on this date.  Electronically Signed: Villa Herb, PA-C 10/06/2020, 2:18 PM   I spent a total of 25 Minutes in face to face in clinical consultation, greater than 50% of which was counseling/coordinating care for diagnostic cerebral angiogram.

## 2020-10-07 ENCOUNTER — Encounter (HOSPITAL_COMMUNITY): Payer: Self-pay

## 2020-10-07 ENCOUNTER — Other Ambulatory Visit: Payer: Self-pay

## 2020-10-07 ENCOUNTER — Other Ambulatory Visit (HOSPITAL_COMMUNITY): Payer: Self-pay | Admitting: Interventional Radiology

## 2020-10-07 ENCOUNTER — Ambulatory Visit (HOSPITAL_COMMUNITY)
Admission: RE | Admit: 2020-10-07 | Discharge: 2020-10-07 | Disposition: A | Discharge status: Home / Self Care | Payer: 59 | Source: Ambulatory Visit | Attending: Interventional Radiology | Admitting: Interventional Radiology

## 2020-10-07 DIAGNOSIS — I771 Stricture of artery: Secondary | ICD-10-CM

## 2020-10-07 DIAGNOSIS — I10 Essential (primary) hypertension: Secondary | ICD-10-CM | POA: Insufficient documentation

## 2020-10-07 DIAGNOSIS — E785 Hyperlipidemia, unspecified: Secondary | ICD-10-CM | POA: Diagnosis not present

## 2020-10-07 DIAGNOSIS — Z8249 Family history of ischemic heart disease and other diseases of the circulatory system: Secondary | ICD-10-CM | POA: Insufficient documentation

## 2020-10-07 DIAGNOSIS — I2510 Atherosclerotic heart disease of native coronary artery without angina pectoris: Secondary | ICD-10-CM | POA: Diagnosis not present

## 2020-10-07 DIAGNOSIS — I6523 Occlusion and stenosis of bilateral carotid arteries: Secondary | ICD-10-CM | POA: Insufficient documentation

## 2020-10-07 DIAGNOSIS — I4891 Unspecified atrial fibrillation: Secondary | ICD-10-CM | POA: Insufficient documentation

## 2020-10-07 DIAGNOSIS — Z79899 Other long term (current) drug therapy: Secondary | ICD-10-CM | POA: Insufficient documentation

## 2020-10-07 DIAGNOSIS — G4733 Obstructive sleep apnea (adult) (pediatric): Secondary | ICD-10-CM | POA: Diagnosis not present

## 2020-10-07 DIAGNOSIS — Z955 Presence of coronary angioplasty implant and graft: Secondary | ICD-10-CM | POA: Diagnosis not present

## 2020-10-07 DIAGNOSIS — Z8673 Personal history of transient ischemic attack (TIA), and cerebral infarction without residual deficits: Secondary | ICD-10-CM | POA: Diagnosis not present

## 2020-10-07 DIAGNOSIS — Z7901 Long term (current) use of anticoagulants: Secondary | ICD-10-CM | POA: Insufficient documentation

## 2020-10-07 HISTORY — PX: IR ANGIO VERTEBRAL SEL SUBCLAVIAN INNOMINATE BILAT MOD SED: IMG5366

## 2020-10-07 HISTORY — PX: IR ANGIO INTRA EXTRACRAN SEL COM CAROTID INNOMINATE BILAT MOD SED: IMG5360

## 2020-10-07 HISTORY — PX: IR US GUIDE VASC ACCESS RIGHT: IMG2390

## 2020-10-07 LAB — BASIC METABOLIC PANEL
Anion gap: 8 (ref 5–15)
BUN: 18 mg/dL (ref 6–20)
CO2: 24 mmol/L (ref 22–32)
Calcium: 9.5 mg/dL (ref 8.9–10.3)
Chloride: 105 mmol/L (ref 98–111)
Creatinine, Ser: 1.03 mg/dL — ABNORMAL HIGH (ref 0.44–1.00)
GFR, Estimated: >60 mL/min (ref >60)
Glucose, Bld: 113 mg/dL — ABNORMAL HIGH (ref 70–99)
Potassium: 3.3 mmol/L — ABNORMAL LOW (ref 3.5–5.1)
Sodium: 137 mmol/L (ref 135–145)

## 2020-10-07 LAB — CBC
HCT: 38.1 % (ref 36.0–46.0)
Hemoglobin: 12 g/dL (ref 12.0–15.0)
MCH: 28.4 pg (ref 26.0–34.0)
MCHC: 31.5 g/dL (ref 30.0–36.0)
MCV: 90.1 fL (ref 80.0–100.0)
Platelets: 319 10*3/uL (ref 150–400)
RBC: 4.23 MIL/uL (ref 3.87–5.11)
RDW: 13.6 % (ref 11.5–15.5)
WBC: 7.9 10*3/uL (ref 4.0–10.5)
nRBC: 0 % (ref 0.0–0.2)

## 2020-10-07 LAB — PROTIME-INR
INR: 1.1 (ref 0.8–1.2)
Prothrombin Time: 14.2 seconds (ref 11.4–15.2)

## 2020-10-07 IMAGING — XA IR CAROTID INTERNAL HEAD/NECK BILAT  (MS)
2 of 3 series · 11 of 24 positions shown · IV contrast (IODINE)
Comparison: Diagnostic catheter arteriogram [DATE].

CLINICAL DATA: Patient with known history of right-sided internal
carotid artery occlusion. Recent ultrasound reporting increased
velocities in the proximal left internal carotid artery suspicious
for worsening stenosis.

EXAM:
IR ANGIO INTRA EXTRACRAN SEL INTERNAL CAROTID BILAT MOD SED
TECHNIQUE: Informed written consent was obtained from the patient after a
thorough discussion of the procedural risks, benefits and
alternatives. All questions were addressed. Maximal Sterile Barrier
Technique was utilized including caps, mask, sterile gowns, sterile
gloves, sterile drape, hand hygiene and skin antiseptic. A timeout
was performed prior to the initiation of the procedure.

[Series 12: cerebral · 1 of 2 slices shown]
[im 1/2]
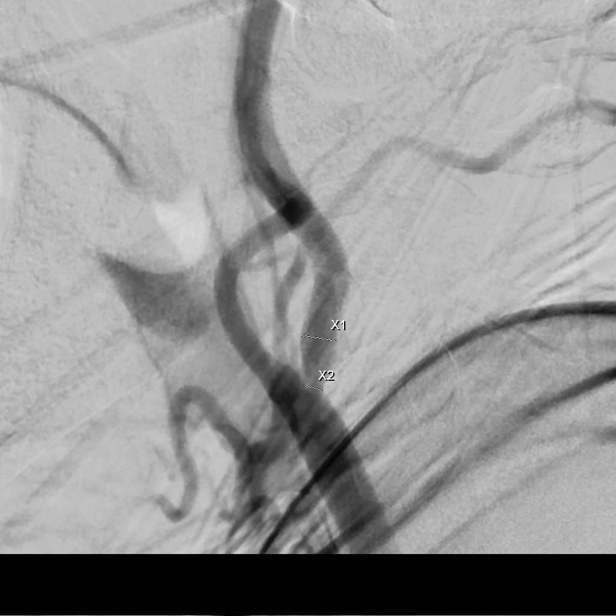

[Series 300: dr. (person_name) · 10 of 290 slices shown]
[im 14/290]
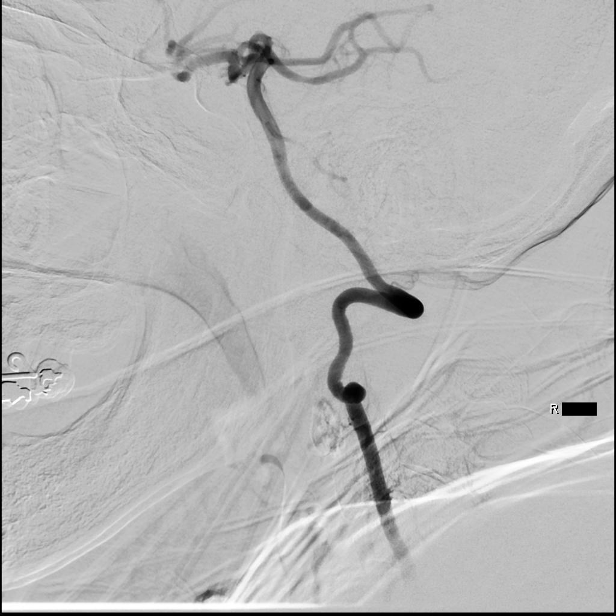
[im 42/290]
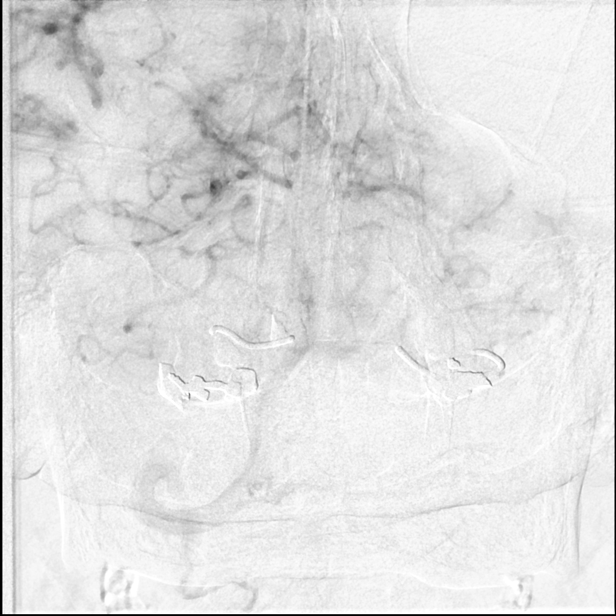
[im 69/290]
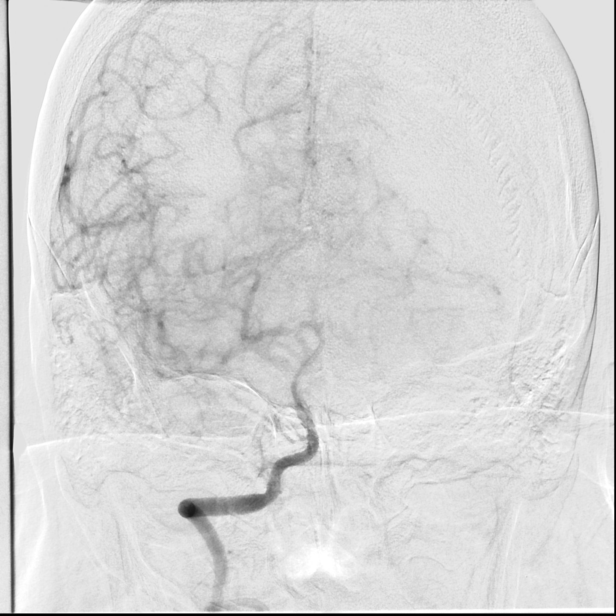
[im 97/290]
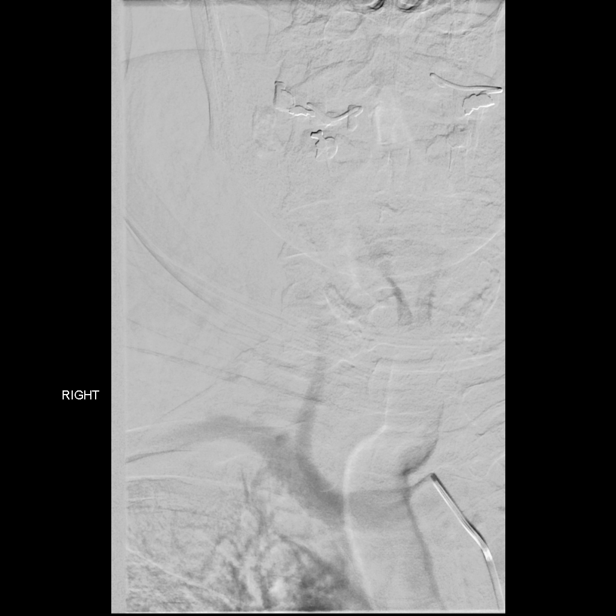
[im 138/290]
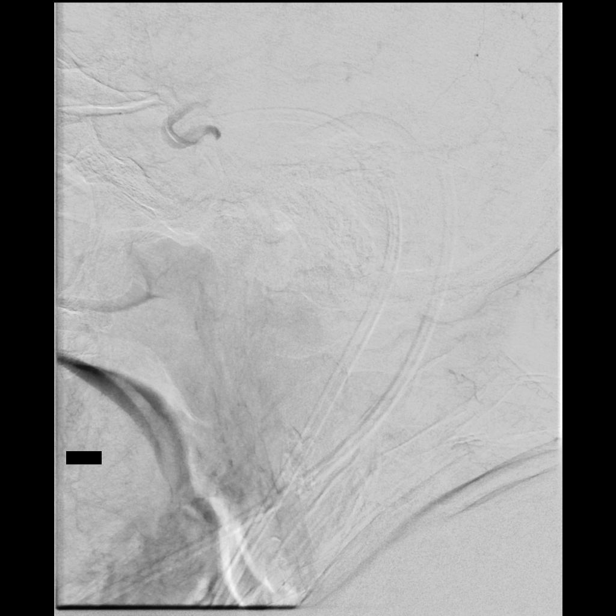
[im 166/290]
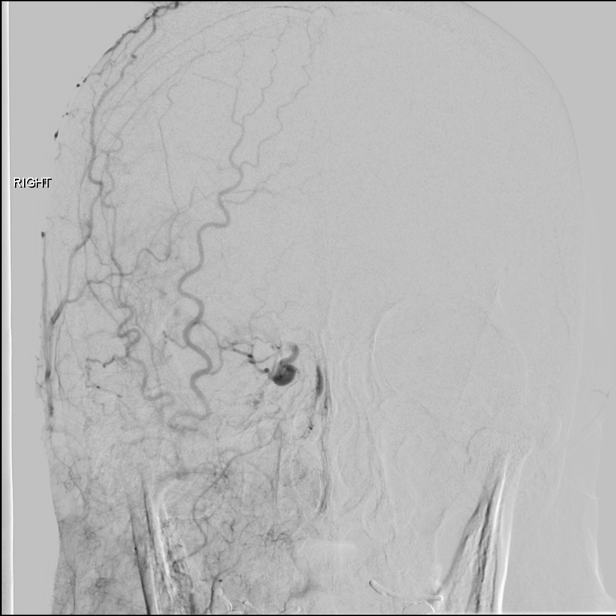
[im 193/290]
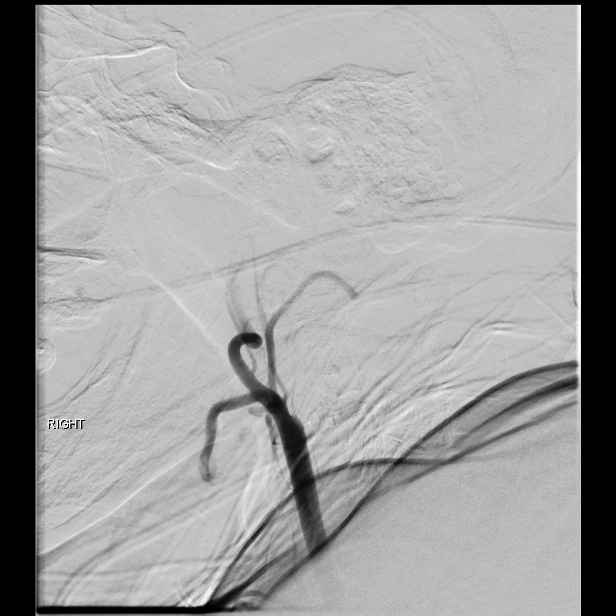
[im 221/290]
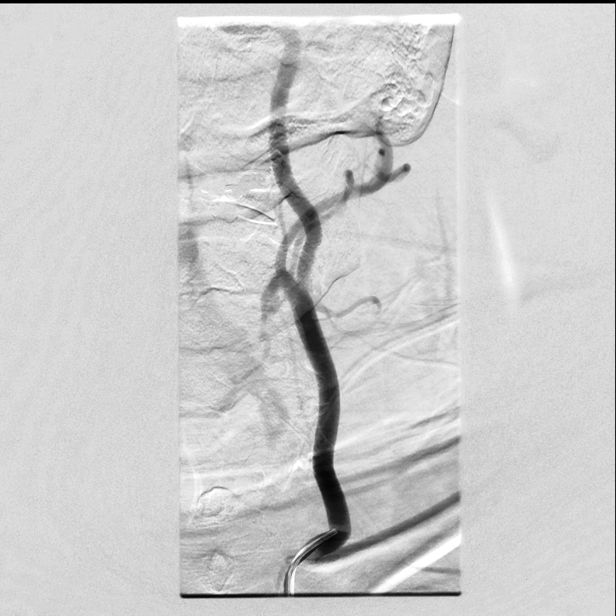
[im 248/290]
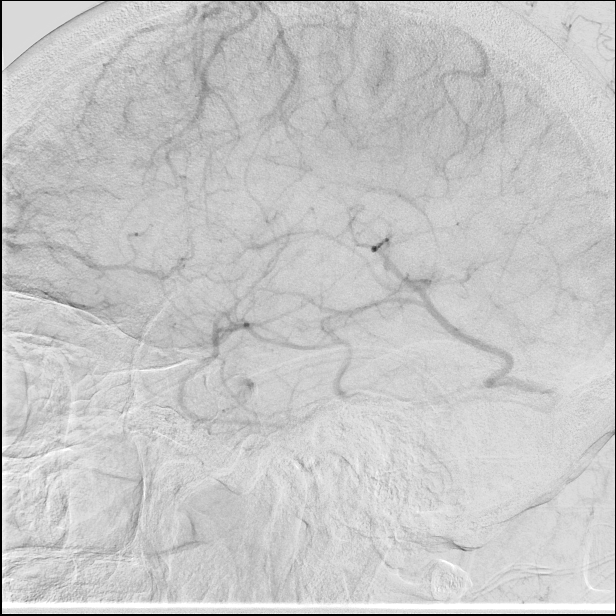
[im 276/290]
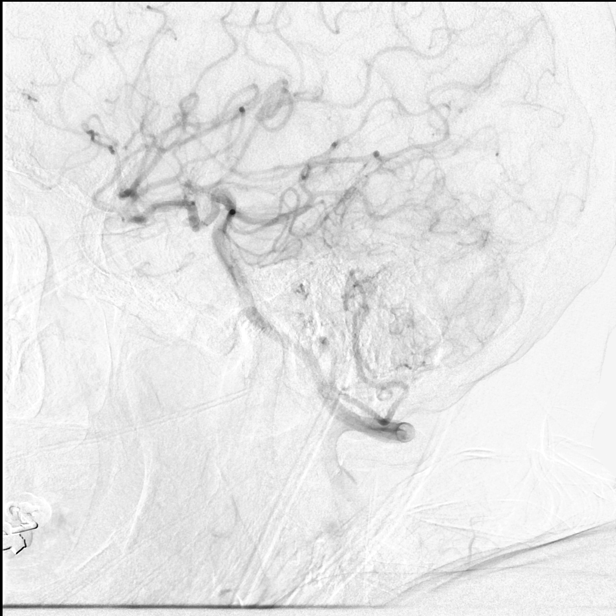

[11 of 24 positions shown; findings below may reference images not displayed]

MEDICATIONS:
Heparin [YY] units IA. No antibiotic was administered within 1 hour
of the procedure.

ANESTHESIA/SEDATION:
Versed 0.5 mg IV; Fentanyl 100 mcg IV

Moderate Sedation Time:  87 minutes

The patient was continuously monitored during the procedure by the
interventional radiology nurse under my direct supervision.

CONTRAST:  Omnipaque 240 80 mL.

FLUOROSCOPY TIME:  Fluoroscopy Time: 22 minutes 42 seconds ([YY]
mGy).

COMPLICATIONS:
None immediate.
The right forearm was prepped and draped in the usual sterile
manner. The right radial artery was then identified with ultrasound,
and its morphology documented by imaging. A dorsal palmar
anastomosis was verified to be present

Using ultrasound guidance, and a micropuncture set access into the
right radial artery was obtained over an 018 inch micro guidewire. A
[DATE] French radial sheath was inserted. The obturator, and micro
guidewire were removed. Good aspiration was obtained from the side
port of the radial sheath. A cocktail of [YY] units of heparin,
mg of verapamil, and 200 mcg of nitroglycerin was then infused in
diluted form through the radial sheath without event. A right radial
arteriogram was performed. Over a 0.035 inch Roadrunner guidewire, BETUN
BETUN 2 and then BETUN 3 diagnostic catheter was advanced to
the aortic arch region. Cannulations were obtained of the right
vertebral artery. However, due to tortuous anatomy, access into the
common carotid arteries and the left vertebral artery could not be
obtained. A right common femoral artery approach was then obtained.

The right groin was prepped and draped in the usual sterile fashion.
Thereafter using modified Seldinger technique, transfemoral access
into the right common femoral artery was obtained without
difficulty. Over a 0.035 inch guidewire, a 5 French Pinnacle sheath
was inserted. Through this, and also over 0.035 inch guidewire, a 5
French JB 1 catheter was advanced to the aortic arch region and
selectively positioned in the right common carotid artery, the left
common carotid artery and the left vertebral artery.
FINDINGS: The right vertebral artery origin is widely patent.

The vessel is seen to opacify to the cranial skull base. Wide
patency is seen of the right vertebrobasilar junction with
hypoplastic right posterior-inferior cerebellar artery.

Opacified portions of the basilar artery, the posterior cerebral
arteries, the superior cerebellar arteries and the anterior-inferior
cerebellar arteries into the capillary and venous phases is widely
patent.

Unopacified blood is seen in the basilar artery from the
contralateral vertebral artery.

Also seen is prompt retrograde opacification via posterior
communicating artery on the right side into the supraclinoid right
ICA and subsequently the right anterior cerebral artery and the
right middle cerebral artery into the capillary and venous phases.

The right common carotid arteriogram demonstrates widely patent
right external carotid artery and its major branches. A stump of a
previously occluded right internal carotid artery remains stable.
More distally there is partial reconstitution of the right internal
carotid artery in the caval cavernous segment from the external
carotid artery branches notably the ipsilateral ophthalmic artery.
Mixing of unopacified blood is seen in the supraclinoid right ICA
from the posterior circulation as described above.

There is no antegrade clearance of contrast from the proximal
cavernous segment consistent with occlusion of the right internal
carotid artery proximally.

The left common carotid arteriogram demonstrates the left external
carotid artery and its major branches to be widely patent.

The left internal carotid artery at the bulb has a severe tapered
narrowing of approximately 75% without associated ulcerations or of
intraluminal filling defects. The vessel is, otherwise, seen to
opacify to the cranial skull base. Petrous segment is widely patent.
There is an approximately 50% stenosis at the petrous cavernous
junction.

Mild fusiform dilatation is seen of the proximal cavernous and the
caval segments.

Left supraclinoid segment is widely patent.

The left middle cerebral artery and the left anterior cerebral
artery opacify into the capillary and venous phases. Prompt cross
filling via the anterior communicating artery of the right anterior
cerebral A2 segment and distally is noted.

Also seen is transient opacification of the left posterior
communicating artery and transiently the left posterior cerebral
artery distribution.

The left vertebral artery origin is widely patent.

The vessel is seen to opacify to the cranial skull base. Wide
patency is seen of the left posterior-inferior cerebellar artery and
the left vertebrobasilar junction.

The basilar artery, the posterior cerebral arteries, the superior
cerebellar arteries and the anterior-inferior cerebellar arteries
opacify into the capillary and venous phases.

Again demonstrated is prompt retrograde opacification via the right
posterior communicating artery of the right supraclinoid ICA, and
right middle cerebral artery distributions.
IMPRESSION: Angiographically occluded right internal carotid artery at the bulb
with partial reconstitution in the caval cavernous segment from the
external carotid artery branches via the ipsilateral ophthalmic
artery.

Approximately 75% tapered stenosis of the left internal carotid
artery at the bulb.

The right middle cerebral artery and the right A1 segment opacify
from the left internal carotid artery via the anterior communicating
artery.

PLAN:
Findings reviewed with the patient. Given the worsening left
internal carotid artery proximally in the face of an occluded right
internal carotid artery, the option of endovascular
revascularization with stent assisted angioplasty in order to
prevent future catastrophic neurologic events were reviewed. The
other option was continued medical surveillance on
anticoagulation/anti-platelet. The patient expressed her desire to
proceed with revascularization endovascularly. This will be
scheduled 3-4 weeks following her left hip surgery as per patient.
Patient advised to call should she have any concerns or questions.

## 2020-10-07 MED ORDER — FENTANYL CITRATE (PF) 100 MCG/2ML IJ SOLN
INTRAMUSCULAR | Status: DC | PRN
  Administered 2020-10-07 (×2): 25 ug via INTRAVENOUS
  Administered 2020-10-07: 50 ug via INTRAVENOUS

## 2020-10-07 MED ORDER — FENTANYL CITRATE (PF) 100 MCG/2ML IJ SOLN
INTRAMUSCULAR | Status: AC
  Filled 2020-10-07: qty 2

## 2020-10-07 MED ORDER — SODIUM CHLORIDE 0.9 % IV SOLN: INTRAVENOUS | Status: AC

## 2020-10-07 MED ORDER — SODIUM CHLORIDE 0.9 % IV SOLN: INTRAVENOUS | Status: DC

## 2020-10-07 MED ORDER — VERAPAMIL HCL 2.5 MG/ML IV SOLN
INTRAVENOUS | Status: AC
  Filled 2020-10-07: qty 2

## 2020-10-07 MED ORDER — MIDAZOLAM HCL 2 MG/2ML IJ SOLN
INTRAMUSCULAR | Status: DC | PRN
  Administered 2020-10-07: .5 mg via INTRAVENOUS

## 2020-10-07 MED ORDER — HEPARIN SODIUM (PORCINE) 1000 UNIT/ML IJ SOLN
INTRAMUSCULAR | Status: AC
  Filled 2020-10-07: qty 1

## 2020-10-07 MED ORDER — IOHEXOL 240 MG/ML SOLN
50.0000 mL | Freq: Once | INTRAMUSCULAR | Status: AC | PRN
  Administered 2020-10-07: 25 mL via INTRAVENOUS

## 2020-10-07 MED ORDER — MIDAZOLAM HCL 2 MG/2ML IJ SOLN
INTRAMUSCULAR | Status: AC
  Filled 2020-10-07: qty 2

## 2020-10-07 MED ORDER — LIDOCAINE HCL 1 % IJ SOLN
INTRAMUSCULAR | Status: AC
  Filled 2020-10-07: qty 20

## 2020-10-07 MED ORDER — DIPHENHYDRAMINE HCL 50 MG PO CAPS
50.0000 mg | ORAL_CAPSULE | Freq: Once | ORAL | Status: AC
  Administered 2020-10-07: 50 mg via ORAL
  Filled 2020-10-07: qty 1

## 2020-10-07 MED ORDER — NITROGLYCERIN 1 MG/10 ML FOR IR/CATH LAB
INTRA_ARTERIAL | Status: AC
  Filled 2020-10-07: qty 10

## 2020-10-07 MED ORDER — IOHEXOL 240 MG/ML SOLN
50.0000 mL | Freq: Once | INTRAMUSCULAR | Status: AC | PRN
  Administered 2020-10-07: 10 mL via INTRAVENOUS

## 2020-10-07 MED ORDER — VERAPAMIL HCL 2.5 MG/ML IV SOLN: INTRA_ARTERIAL | Status: DC | PRN

## 2020-10-07 NOTE — Procedures (Signed)
S/P 4 vessel cerebral artrriogram Rt CFA and RT rad approach. Findings. 1.Occluded RT ICA prox . 2.Approx 75 % stenosis Lt ICA prox S.Madelein Mahadeo MD

## 2020-10-07 NOTE — Progress Notes (Signed)
Pt ambulated without difficulty or bleeding.   Discharged home with her friend, Mindi Junker, who will drive and stay with pt x 24 hrs.

## 2020-10-08 ENCOUNTER — Other Ambulatory Visit (HOSPITAL_COMMUNITY): Payer: Self-pay | Admitting: Interventional Radiology

## 2020-10-08 DIAGNOSIS — I771 Stricture of artery: Secondary | ICD-10-CM

## 2020-10-13 ENCOUNTER — Ambulatory Visit (INDEPENDENT_AMBULATORY_CARE_PROVIDER_SITE_OTHER): Payer: 59 | Admitting: Nurse Practitioner

## 2020-10-13 ENCOUNTER — Other Ambulatory Visit: Payer: Self-pay

## 2020-10-13 ENCOUNTER — Encounter: Payer: Self-pay | Admitting: Nurse Practitioner

## 2020-10-13 VITALS — BP 110/66 | HR 74 | Temp 98.7°F | Ht 61.0 in | Wt 197.6 lb

## 2020-10-13 DIAGNOSIS — I119 Hypertensive heart disease without heart failure: Secondary | ICD-10-CM | POA: Diagnosis not present

## 2020-10-13 DIAGNOSIS — Z8673 Personal history of transient ischemic attack (TIA), and cerebral infarction without residual deficits: Secondary | ICD-10-CM | POA: Diagnosis not present

## 2020-10-13 DIAGNOSIS — Z23 Encounter for immunization: Secondary | ICD-10-CM

## 2020-10-13 MED ORDER — ATORVASTATIN CALCIUM 80 MG PO TABS: 80.0000 mg | ORAL_TABLET | Freq: Every day | ORAL | 2 refills | Status: DC

## 2020-10-13 MED ORDER — FLUTICASONE PROPIONATE 50 MCG/ACT NA SUSP: NASAL | 2 refills | Status: DC

## 2020-10-13 MED ORDER — MAGNESIUM 250 MG PO TABS: ORAL_TABLET | ORAL | 2 refills | Status: AC

## 2020-10-13 NOTE — Progress Notes (Signed)
I,Katawbba Wiggins,acting as a Neurosurgeon for [employer], FNP.,have documented all relevant documentation on the behalf of Arnette Felts, FNP,as directed by  Arnette Felts, FNP while in the presence of Arnette Felts, FNP.  This visit occurred during the SARS-CoV-2 public health emergency.  Safety protocols were in place, including screening questions prior to the visit, additional usage of staff PPE, and extensive cleaning of exam room while observing appropriate contact time as indicated for disinfecting solutions.  Subjective:     Patient ID: [name] , female    DOB: [birth year] , 60 y.o.   MRN: [id number]   Chief Complaint  Patient presents with   complete forms    HPI  The patient is here today for completion of disability forms for CVA in 2021.   She needed her disability forms completed for her previous CVA and her part time job she had at the time in 2021.  She has no current concerns.     Past Medical History:  Diagnosis Date   Allergy    Arthritis    back    Atypical chest pain 06/30/2016   Back pain    DUE TO INJURY AT WORK ON05/2013   Bruises easily    Chronic lower back pain    Coronary artery disease    Depression    was on meds 2 yrs ago    Headache(784.0)    rarely   History of bronchitis    last time about 38yrs ago    Hypertension    takes Benicar daily   Hypothyroidism 06/30/2016   OSA (obstructive sleep apnea) 04/28/2020   Preoperative evaluation of a medical condition to rule out surgical contraindications (TAR required) 09/12/2020   Pure hypercholesterolemia 05/31/2019   Vitamin D deficiency    takes Vitamin D 2 times/wk   Weakness    and numbness right foot     Family History  Problem Relation Age of Onset   Heart disease Mother    CAD Mother    Stroke Mother    Heart disease Sister    Heart attack Sister    Liver disease Brother    Other Father        unknown medical history   CAD Brother    Colon cancer Neg Hx    Esophageal cancer Neg Hx     Rectal cancer Neg Hx    Stomach cancer Neg Hx      Current Outpatient Medications:    acetaminophen (TYLENOL) 500 MG tablet, Take 500-1,000 mg by mouth every 6 (six) hours as needed (pain or headaches)., Disp: , Rfl:    apixaban (ELIQUIS) 5 MG TABS tablet, Take 1 tablet (5 mg total) by mouth 2 (two) times daily., Disp: 180 tablet, Rfl: 1   cholecalciferol (VITAMIN D3) 25 MCG (1000 UNIT) tablet, Take 1,000 Units by mouth daily. , Disp: , Rfl:    diltiazem (CARDIZEM CD) 120 MG 24 hr capsule, TAKE 1 CAPSULE BY MOUTH EVERY DAY, Disp: 90 capsule, Rfl: 1   ezetimibe (ZETIA) 10 MG tablet, TAKE 1 TABLET BY MOUTH EVERY DAY, Disp: 90 tablet, Rfl: 3   furosemide (LASIX) 40 MG tablet, DAILY FOR 3 DAYS AND THEN AS DIRECTED (Patient taking differently: Take 40 mg by mouth daily as needed for fluid.), Disp: 20 tablet, Rfl: 1   hydrOXYzine (ATARAX/VISTARIL) 25 MG tablet, Take 1 tablet (25 mg total) by mouth 3 (three) times daily as needed., Disp: 30 tablet, Rfl: 0   levocetirizine (XYZAL) 5 MG  tablet, TAKE 1 TABLET BY MOUTH EVERY DAY, Disp: 90 tablet, Rfl: 1   Olmesartan-amLODIPine-HCTZ 20-5-12.5 MG TABS, TAKE 1 TABLET BY MOUTH EVERY DAY, Disp: 90 tablet, Rfl: 0   topiramate (TOPAMAX) 100 MG tablet, Take 1 tablet (100 mg total) by mouth at bedtime., Disp: 90 tablet, Rfl: 4   atorvastatin (LIPITOR) 80 MG tablet, Take 1 tablet (80 mg total) by mouth daily at 6 PM., Disp: 90 tablet, Rfl: 2   fluticasone (FLONASE) 50 MCG/ACT nasal spray, PLACE 2 SPRAYS IN BOTH NOSTRILS DAILY AS NEEDED FOR ALLERGIES OR RHINITIS, Disp: 48 mL, Rfl: 2   Magnesium 250 MG TABS, TAKE 1 TABLET BY MOUTH DAILY WITH EVENING MEALS, Disp: 90 tablet, Rfl: 2   Allergies  Allergen Reactions   Other Itching and Other (See Comments)    Patient experienced intense itching in her arm that was accessed for a past angiogram     Review of Systems  Constitutional: Negative.   Respiratory: Negative.    Cardiovascular: Negative.    Gastrointestinal: Negative.   Psychiatric/Behavioral: Negative.    All other systems reviewed and are negative.   Today's Vitals   10/13/20 1028  BP: 110/66  Pulse: 74  Temp: 98.7 F (37.1 C)  Weight: 197 lb 9.6 oz (89.6 kg)  Height: 5\' 1"  (1.549 m)   Body mass index is 37.34 kg/m.   Objective:  Physical Exam Constitutional:      General: She is not in acute distress.    Appearance: Normal appearance. She is obese.  Pulmonary:     Effort: Pulmonary effort is normal. No respiratory distress.  Musculoskeletal:     Comments: She does have a limp on the left side when ambulating  Neurological:     General: No focal deficit present.     Mental Status: She is alert and oriented to person, place, and time.     Cranial Nerves: No cranial nerve deficit.  Psychiatric:        Mood and Affect: Mood normal.        Behavior: Behavior normal.        Thought Content: Thought content normal.        Judgment: Judgment normal.        Assessment And Plan:     1. Hypertensive heart disease without heart failure Comments: She will have labs done at her 11/03/2020 appt for her physical  2. History of CVA (cerebrovascular accident) Comments: Completed disability form while in office and given a copy  3. Immunization due Influenza vaccine administered Encouraged to take Tylenol as needed for fever or muscle aches. - Flu Vaccine QUAD 6+ mos PF IM (Fluarix Quad PF)    Patient was given opportunity to ask questions. Patient verbalized understanding of the plan and was able to repeat key elements of the plan. All questions were answered to their satisfaction.  11/05/2020, FNP   I, Arnette Felts, FNP, have reviewed all documentation for this visit. The documentation on 10/13/20 for the exam, diagnosis, procedures, and orders are all accurate and complete.   IF YOU HAVE BEEN REFERRED TO A SPECIALIST, IT MAY TAKE 1-2 WEEKS TO SCHEDULE/PROCESS THE REFERRAL. IF YOU HAVE NOT HEARD FROM  US/SPECIALIST IN TWO WEEKS, PLEASE GIVE 12/13/20 A CALL AT [phone].   THE PATIENT IS ENCOURAGED TO PRACTICE SOCIAL DISTANCING DUE TO THE COVID-19 PANDEMIC.

## 2020-10-15 ENCOUNTER — Other Ambulatory Visit: Payer: Self-pay

## 2020-10-15 NOTE — Progress Notes (Signed)
.  prior

## 2020-10-16 ENCOUNTER — Other Ambulatory Visit: Payer: Self-pay

## 2020-10-16 ENCOUNTER — Ambulatory Visit (INDEPENDENT_AMBULATORY_CARE_PROVIDER_SITE_OTHER): Payer: 59 | Admitting: Pharmacist Clinician (PhC)/ Clinical Pharmacy Specialist

## 2020-10-16 DIAGNOSIS — I480 Paroxysmal atrial fibrillation: Secondary | ICD-10-CM

## 2020-10-16 DIAGNOSIS — I639 Cerebral infarction, unspecified: Secondary | ICD-10-CM

## 2020-10-16 MED ORDER — ENOXAPARIN SODIUM 150 MG/ML IJ SOSY: 150.0000 mg | PREFILLED_SYRINGE | INTRAMUSCULAR | 0 refills | Status: DC

## 2020-10-16 NOTE — Progress Notes (Signed)
October 17,  hip total left replacement.  Chart indicates to hold Eliquis x 5 days for procedure  Reviewed injection technique and answered all questions for patient today.    October 11: Eliquis twice daily  October 12: Eliquis in the morning, Inject enoxaparin 150 mg in the fatty abdominal tissue at least 2 inches from the belly button in the evening  October 13: Inject enoxaparin in the fatty tissue in the evening  October 14: Inject enoxaparin in the fatty tissue in the evening  October 15: Inject enoxaparin in the fatty tissue in the evening   October 16: No enoxaparin  October 17: Procedure Day -

## 2020-10-16 NOTE — Patient Instructions (Signed)
October 11: Eliquis twice daily  October 12: Eliquis in the morning, Inject enoxaparin 150 mg in the fatty abdominal tissue at least 2 inches from the belly button in the evening  October 13: Inject enoxaparin in the fatty tissue in the evening  October 14: Inject enoxaparin in the fatty tissue in the evening  October 15: Inject enoxaparin in the fatty tissue in the evening   October 16: No enoxaparin  October 17: Procedure Day -

## 2020-10-16 NOTE — Progress Notes (Signed)
Surgical Instructions    Your procedure is scheduled on 10/27/20.  Report to [facility] Main Entrance "A" at 10:20 A.M., then check in with the Admitting office.  Call this number if you have problems the morning of surgery:  [phone]   If you have any questions prior to your surgery date call [phone]: Open Monday-Friday 8am-4pm    Remember:  Do not eat after midnight the night before your surgery  You may drink clear liquids until 09:20am the morning of your surgery.   Clear liquids allowed are: Water, Non-Citrus Juices (without pulp), Carbonated Beverages, Clear Tea, Black Coffee ONLY (NO MILK, CREAM OR POWDERED CREAMER of any kind), and Gatorade  Patient Instructions  The night before surgery:  No food after midnight. ONLY clear liquids after midnight  The day of surgery (if you do NOT have diabetes):  Drink ONE (1) Pre-Surgery Clear Ensure by 09:20am the morning of surgery. Drink in one sitting. Do not sip.  This drink was given to you during your hospital  pre-op appointment visit. Nothing else to drink after completing the  Pre-Surgery Clear Ensure.       Take these medicines the morning of surgery with A SIP OF WATER  diltiazem (CARDIZEM CD) ezetimibe (ZETIA)   IF NEEDED:  acetaminophen (TYLENOL)  fluticasone (FLONASE) hydrOXYzine (ATARAX/VISTARIL)  As of today, STOP taking any Aspirin (unless otherwise instructed by your surgeon) Aleve, Naproxen, Ibuprofen, Motrin, Advil, Goody's, BC's, all herbal medications, fish oil, and all vitamins.  Please stop apixaban (ELIQUIS) 1 day prior to surgery. Your last dose will be 10/25/20.   After your COVID test   You are not required to quarantine however you are required to wear a well-fitting mask when you are out and around people not in your household.  If your mask becomes wet or soiled, replace with a new one.  Wash your hands often with soap and water for 20 seconds or clean your hands with an  alcohol-based hand sanitizer that contains at least 60% alcohol.  Do not share personal items.  Notify your provider: if you are in close contact with someone who has COVID  or if you develop a fever of 100.4 or greater, sneezing, cough, sore throat, shortness of breath or body aches.             Do not wear jewelry or makeup Do not wear lotions, powders, perfumes/colognes, or deodorant. Do not shave 48 hours prior to surgery.   Do not bring valuables to the hospital. DO Not wear nail polish, gel polish, artificial nails, or any other type of covering on natural nails including finger and toenails. If patients have artificial nails, gel coating, etc. that need to be removed by a nail salon, please have this removed prior to surgery or surgery may need to be canceled/delayed if the surgeon/ anesthesia feels like the patient is unable to be adequately monitored.             [facility] is not responsible for any belongings or valuables.  Do NOT Smoke (Tobacco/Vaping)  24 hours prior to your procedure  If you use a CPAP at night, you may bring your mask for your overnight stay.   Contacts, glasses, hearing aids, dentures or partials may not be worn into surgery, please bring cases for these belongings   For patients admitted to the hospital, discharge time will be determined by your treatment team.   Patients discharged the day of surgery will not be allowed  to drive home, and someone needs to stay with them for 24 hours.  NO VISITORS WILL BE ALLOWED IN PRE-OP WHERE PATIENTS ARE PREPPED FOR SURGERY.  ONLY 1 SUPPORT PERSON MAY BE PRESENT IN THE WAITING ROOM WHILE YOU ARE IN SURGERY.  IF YOU ARE TO BE ADMITTED, ONCE YOU ARE IN YOUR ROOM YOU WILL BE ALLOWED TWO (2) VISITORS. 1 (ONE) VISITOR MAY STAY OVERNIGHT BUT MUST ARRIVE TO THE ROOM BY 8pm.  Minor children may have two parents present. Special consideration for safety and communication needs will be reviewed on a case by case  basis.  Special instructions:    Oral Hygiene is also important to reduce your risk of infection.  Remember - BRUSH YOUR TEETH THE MORNING OF SURGERY WITH YOUR REGULAR TOOTHPASTE   [facility]- Preparing For Surgery  Before surgery, you can play an important role. Because skin is not sterile, your skin needs to be as free of germs as possible. You can reduce the number of germs on your skin by washing with CHG (chlorahexidine gluconate) Soap before surgery.  CHG is an antiseptic cleaner which kills germs and bonds with the skin to continue killing germs even after washing.     Please do not use if you have an allergy to CHG or antibacterial soaps. If your skin becomes reddened/irritated stop using the CHG.  Do not shave (including legs and underarms) for at least 48 hours prior to first CHG shower. It is OK to shave your face.  Please follow these instructions carefully.     Shower the NIGHT BEFORE SURGERY and the MORNING OF SURGERY with CHG Soap.   If you chose to wash your hair, wash your hair first as usual with your normal shampoo. After you shampoo, rinse your hair and body thoroughly to remove the shampoo.  Then [employer] and genitals (private parts) with your normal soap and rinse thoroughly to remove soap.  After that Use CHG Soap as you would any other liquid soap. You can apply CHG directly to the skin and wash gently with a scrungie or a clean washcloth.   Apply the CHG Soap to your body ONLY FROM THE NECK DOWN.  Do not use on open wounds or open sores. Avoid contact with your eyes, ears, mouth and genitals (private parts). Wash Face and genitals (private parts)  with your normal soap.   Wash thoroughly, paying special attention to the area where your surgery will be performed.  Thoroughly rinse your body with warm water from the neck down.  DO NOT shower/wash with your normal soap after using and rinsing off the CHG Soap.  Pat yourself dry with a CLEAN TOWEL.  Wear CLEAN  PAJAMAS to bed the night before surgery  Place CLEAN SHEETS on your bed the night before your surgery  DO NOT SLEEP WITH PETS.   Day of Surgery: Take a shower with CHG soap. Wear Clean/Comfortable clothing the morning of surgery Do not apply any deodorants/lotions.   Remember to brush your teeth WITH YOUR REGULAR TOOTHPASTE.   Please read over the following fact sheets that you were given.

## 2020-10-17 ENCOUNTER — Encounter (HOSPITAL_COMMUNITY): Payer: Self-pay

## 2020-10-17 ENCOUNTER — Encounter (HOSPITAL_COMMUNITY)
Admission: RE | Admit: 2020-10-17 | Discharge: 2020-10-17 | Disposition: A | Discharge status: Home / Self Care | Payer: 59 | Source: Ambulatory Visit | Attending: Orthopaedic Surgery | Admitting: Orthopaedic Surgery

## 2020-10-17 ENCOUNTER — Other Ambulatory Visit: Payer: Self-pay

## 2020-10-17 ENCOUNTER — Telehealth: Payer: Self-pay | Admitting: Orthopaedic Surgery

## 2020-10-17 DIAGNOSIS — Z01812 Encounter for preprocedural laboratory examination: Secondary | ICD-10-CM | POA: Insufficient documentation

## 2020-10-17 DIAGNOSIS — Z79899 Other long term (current) drug therapy: Secondary | ICD-10-CM | POA: Diagnosis not present

## 2020-10-17 DIAGNOSIS — I480 Paroxysmal atrial fibrillation: Secondary | ICD-10-CM | POA: Diagnosis not present

## 2020-10-17 DIAGNOSIS — Z7901 Long term (current) use of anticoagulants: Secondary | ICD-10-CM | POA: Diagnosis not present

## 2020-10-17 DIAGNOSIS — I10 Essential (primary) hypertension: Secondary | ICD-10-CM | POA: Insufficient documentation

## 2020-10-17 DIAGNOSIS — I2510 Atherosclerotic heart disease of native coronary artery without angina pectoris: Secondary | ICD-10-CM | POA: Diagnosis not present

## 2020-10-17 DIAGNOSIS — M1612 Unilateral primary osteoarthritis, left hip: Secondary | ICD-10-CM | POA: Insufficient documentation

## 2020-10-17 DIAGNOSIS — Z8673 Personal history of transient ischemic attack (TIA), and cerebral infarction without residual deficits: Secondary | ICD-10-CM | POA: Diagnosis not present

## 2020-10-17 HISTORY — DX: Unspecified atrial fibrillation: I48.91

## 2020-10-17 LAB — PROTIME-INR
INR: 1.2 (ref 0.8–1.2)
Prothrombin Time: 14.8 seconds (ref 11.4–15.2)

## 2020-10-17 LAB — CBC WITH DIFFERENTIAL/PLATELET
Abs Immature Granulocytes: 0.01 10*3/uL (ref 0.00–0.07)
Basophils Absolute: 0 10*3/uL (ref 0.0–0.1)
Basophils Relative: 1 %
Eosinophils Absolute: 0.1 10*3/uL (ref 0.0–0.5)
Eosinophils Relative: 1 %
HCT: 38.2 % (ref 36.0–46.0)
Hemoglobin: 12.3 g/dL (ref 12.0–15.0)
Immature Granulocytes: 0 %
Lymphocytes Relative: 35 %
Lymphs Abs: 2.3 10*3/uL (ref 0.7–4.0)
MCH: 28.9 pg (ref 26.0–34.0)
MCHC: 32.2 g/dL (ref 30.0–36.0)
MCV: 89.9 fL (ref 80.0–100.0)
Monocytes Absolute: 0.6 10*3/uL (ref 0.1–1.0)
Monocytes Relative: 9 %
Neutro Abs: 3.6 10*3/uL (ref 1.7–7.7)
Neutrophils Relative %: 54 %
Platelets: 310 10*3/uL (ref 150–400)
RBC: 4.25 MIL/uL (ref 3.87–5.11)
RDW: 13.7 % (ref 11.5–15.5)
WBC: 6.6 10*3/uL (ref 4.0–10.5)
nRBC: 0 % (ref 0.0–0.2)

## 2020-10-17 LAB — TYPE AND SCREEN
ABO/RH(D): A POS
Antibody Screen: NEGATIVE

## 2020-10-17 LAB — URINALYSIS, ROUTINE W REFLEX MICROSCOPIC
Bilirubin Urine: NEGATIVE
Glucose, UA: NEGATIVE mg/dL
Ketones, ur: NEGATIVE mg/dL
Nitrite: NEGATIVE
Protein, ur: NEGATIVE mg/dL
Specific Gravity, Urine: 1.005 (ref 1.005–1.030)
pH: 6 (ref 5.0–8.0)

## 2020-10-17 LAB — COMPREHENSIVE METABOLIC PANEL
ALT: 22 U/L (ref 0–44)
AST: 20 U/L (ref 15–41)
Albumin: 4.1 g/dL (ref 3.5–5.0)
Alkaline Phosphatase: 86 U/L (ref 38–126)
Anion gap: 7 (ref 5–15)
BUN: 13 mg/dL (ref 6–20)
CO2: 25 mmol/L (ref 22–32)
Calcium: 9.7 mg/dL (ref 8.9–10.3)
Chloride: 106 mmol/L (ref 98–111)
Creatinine, Ser: 0.96 mg/dL (ref 0.44–1.00)
GFR, Estimated: >60 mL/min (ref >60)
Glucose, Bld: 108 mg/dL — ABNORMAL HIGH (ref 70–99)
Potassium: 3.6 mmol/L (ref 3.5–5.1)
Sodium: 138 mmol/L (ref 135–145)
Total Bilirubin: 0.8 mg/dL (ref 0.3–1.2)
Total Protein: 7.9 g/dL (ref 6.5–8.1)

## 2020-10-17 LAB — SURGICAL PCR SCREEN
MRSA, PCR: NEGATIVE
Staphylococcus aureus: NEGATIVE

## 2020-10-17 LAB — APTT: aPTT: 29 seconds (ref 24–36)

## 2020-10-17 NOTE — Progress Notes (Addendum)
PCP - Dr. Dorothyann Peng Cardiologist - Dr. Chilton Si  PPM/ICD - n/a Device Orders -  Rep Notified -   Chest x-ray - n/a EKG - 09/12/20 Stress Test - 07/22/2016 ECHO - 03/17/2020  Cardiac Cath - 07/12/2018  Sleep Study - 05/29/20 CPAP - yes, instructed to bring DOS  Fasting Blood Sugar - n/a Checks Blood Sugar _____ times a day  Blood Thinner Instructions: Eliquis.  Patient instructed to take last dose 10/12 in am and start lovenox bridge 10/12 pm, take lovenox 10/12-10/15, no lovenox 10/16 or 10/17 and patient will resume eliquis on 10/18 Aspirin Instructions: n/a  ERAS Protcol - Clears until 0920 morning of surgery PRE-SURGERY Ensure or G2- complete Ensure by 0920  COVID TEST- appointment on 10/23/20 at 1330 (patient may arrive closer to 1345)   Anesthesia review: yes, hx of CAD, stroke in 07/2019  Patient denies shortness of breath, fever, cough and chest pain at PAT appointment   All instructions explained to the patient, with a verbal understanding of the material. Patient agrees to go over the instructions while at home for a better understanding. Patient also instructed to self quarantine after being tested for COVID-19. The opportunity to ask questions was provided.

## 2020-10-17 NOTE — Telephone Encounter (Signed)
Patient is calling and asking for our office to e-mail her an estimate of time out of work.  She is scheduled for left total hip arthroplasty with Dr. Roda Shutters at [facility] on 10-27-20 @ 12:15pm    Patient's e-mail:  [email or web address]

## 2020-10-20 ENCOUNTER — Other Ambulatory Visit: Payer: Self-pay | Admitting: Physician Assistant

## 2020-10-20 ENCOUNTER — Encounter (HOSPITAL_COMMUNITY): Payer: Self-pay | Admitting: Emergency Medicine

## 2020-10-20 ENCOUNTER — Encounter: Payer: Self-pay | Admitting: Pharmacist Clinician (PhC)/ Clinical Pharmacy Specialist

## 2020-10-20 ENCOUNTER — Telehealth: Payer: Self-pay | Admitting: Orthopaedic Surgery

## 2020-10-20 MED ORDER — CIPROFLOXACIN HCL 500 MG PO TABS: 500.0000 mg | ORAL_TABLET | Freq: Two times a day (BID) | ORAL | 0 refills | Status: AC

## 2020-10-20 MED ORDER — OXYCODONE-ACETAMINOPHEN 5-325 MG PO TABS: 1.0000 | ORAL_TABLET | Freq: Four times a day (QID) | ORAL | 0 refills | Status: DC | PRN

## 2020-10-20 MED ORDER — ONDANSETRON HCL 4 MG PO TABS: 4.0000 mg | ORAL_TABLET | Freq: Three times a day (TID) | ORAL | 0 refills | Status: DC | PRN

## 2020-10-20 MED ORDER — METHOCARBAMOL 500 MG PO TABS: 500.0000 mg | ORAL_TABLET | Freq: Two times a day (BID) | ORAL | 2 refills | Status: DC | PRN

## 2020-10-20 MED ORDER — DOCUSATE SODIUM 100 MG PO CAPS: 100.0000 mg | ORAL_CAPSULE | Freq: Every day | ORAL | 2 refills | Status: DC | PRN

## 2020-10-20 NOTE — Telephone Encounter (Signed)
EMAILED

## 2020-10-20 NOTE — Telephone Encounter (Signed)
Patient calling again--left message on voice mail at 8:52am today, requesting a "tentative" return to work date in [employer] operational [employer].    Please call patient at [phone]

## 2020-10-20 NOTE — Progress Notes (Signed)
Anesthesia Chart Review:   Case: [id number] Date/Time: 10/27/20 1204   Procedure: LEFT TOTAL HIP ARTHROPLASTY ANTERIOR APPROACH (Left: Hip) - 3-C   Anesthesia type: Spinal   Pre-op diagnosis: left hip degenerative joint disease   [facility] / [facility]   Surgeons: Tarry Kos, MD       DISCUSSION: Pt is 60 years old with hx CAD (s/p PCI to CX 2020), HTN, carotid artery disease (R ICA CTO, L ICA 75% - see below), PAF, HTN, stroke, subarachnoid hemorrhage (07/2019), OSA  Patient instructed to take last dose eliquis 10/12 in am and start lovenox bridge 10/12 pm, take lovenox 10/12-10/15, no lovenox 10/16 or 10/17 and patient will resume eliquis on 10/18  Discussed case with Dr. Sandford Craze. Pt will need L ICA treated prior to surgery to reduce stroke risk.   I left a voicemail for Debbie in Dr. Warren Danes office about the need to postpone this elective hip replacement  I spoke with the patient and explained the situation. She is ok with postponing THA and proceeding with L ICA revascularization first but she stated she does not know how to get in touch with Dr. Fatima Sanger office.   I spoke with Morrie Sheldon in Dr. Fatima Sanger office and she will reach out to pt about getting ICA revascularization scheduled.    VS: BP (!) 143/88   Pulse 76   Temp 37.1 C   Resp 17   Ht 5\' 1"  (1.549 m)   Wt 90 kg   LMP 06/03/2011   SpO2 100%   BMI 37.51 kg/m   PROVIDERS: - PCP is 06/05/2011, MD - Neurologist is Dorothyann Peng, MD  - IR Levert Feinstein, MD is managing L carotid disease. Note 10/07/20 after IR ANGIO INTRA EXTRACRAN SEL INTERNAL CAROTID BILAT MOD SED documents L ICA with 75% stenosis. Pt offered continued medical management/surveillance or endovascular revascularization with stent assisted angioplasty. Pt elects to proceed with revascularization endovascularly, scheduled 3-4 weeks following her left hip surgery   - Cardiologist is 10/09/20, MD who cleared pt for surgery at last  office visit 09/12/20. Dr. 11/12/20 notes the following in her note:  "She does have a family history of prolonged QTC.  Her QT is not prolonged.  Would recommend avoiding QT prolonging agents with anesthesia if at all possible.  She also has known complete ICA stenosis on the right and at least moderate on the left.  Repeat carotid Dopplers are pending and she knows that she needs to follow-up with vascular surgery.  Given her atrial fibrillation and obstructive carotid disease, I agree with her neurologist that she would be better bridged and to hold all blood thinners preoperatively.  We will arrange for her to see our pharmacist and do Lovenox shots for 5 days prior to surgery.  She is able to achieve 4 METS of activity and remain limitation is her hip.  Therefore, no repeat ischemic testing is indicated at this time.  Her PCI was over a year ago, and she had otherwise mild disease except for the left circumflex which was stented."   LABS: Labs reviewed: Acceptable for surgery. Duke Salvia, PA with Dr. Trenda Moots has prescribed antibiotics for UTI  (all labs ordered are listed, but only abnormal results are displayed)  Labs Reviewed  COMPREHENSIVE METABOLIC PANEL - Abnormal; Notable for the following components:      Result Value   Glucose, Bld 108 (*)    All other components within normal limits  URINALYSIS, ROUTINE W REFLEX MICROSCOPIC - Abnormal; Notable for the following components:   Color, Urine STRAW (*)    Hgb urine dipstick SMALL (*)    Leukocytes,Ua LARGE (*)    Bacteria, UA RARE (*)    All other components within normal limits  SURGICAL PCR SCREEN  CBC WITH DIFFERENTIAL/PLATELET  PROTIME-INR  APTT  TYPE AND SCREEN     IMAGES: IR ANGIO INTRA EXTRACRAN SEL INTERNAL CAROTID BILAT MOD SED 10/07/20:  - Angiographically occluded right internal carotid artery at the bulb with partial reconstitution in the caval cavernous segment from the external carotid artery branches via the  ipsilateral ophthalmic artery. - Approximately 75% tapered stenosis of the left internal carotid artery at the bulb. - The right middle cerebral artery and the right A1 segment opacify from the left internal carotid artery via the anterior communicating artery.   1 view CXR 03/16/20: No acute cardiopulmonary abnormality.  EKG 09/12/20: NSR. Nonspecific T wave abnormalities.   CV: Carotid US 09/23/20:  - Right Carotid: Evidence consistent with a total occlusion of the right ICA.  - Left Carotid: Velocities in the left ICA are consistent with a 60-79%  stenosis.  - Vertebrals: Bilateral vertebral arteries demonstrate antegrade flow.    Echo 03/17/2020: 1. Left ventricular ejection fraction, by estimation, is 60 to 65%. The left ventricle has normal function. The left ventricle has no regional wall motion abnormalities. There is mild concentric left ventricular hypertrophy. Left ventricular diastolic parameters are consistent with Grade I diastolic dysfunction (impaired relaxation).  2. Right ventricular systolic function is normal. The right ventricular size is mildly enlarged. There is mildly elevated pulmonary artery systolic pressure. The estimated right ventricular systolic pressure is 36.4 mmHg.  3. The mitral valve is grossly normal. Mild mitral valve regurgitation.  4. Tricuspid valve regurgitation is mild to moderate.  5. There is flow acceleration noted in the LVOT without evidence of SAM. Likely related to LVOT narrowing during systole in the setting of septal hypertrophy. Flow acceleration occurs prior to the aortic valve with no evidence of aortic stenosis. DI 0.7.  6. The aortic valve is tricuspid. There is mild calcification of the aortic valve. There is mild thickening of the aortic valve. Aortic valve regurgitation is mild. Mild aortic valve sclerosis is present, with no evidence of aortic valve stenosis.  7. The inferior vena cava is normal in size with greater than 50%  respiratory  variability, suggesting right atrial pressure of 3 mmHg.  - Comparison(s): Compared to prior TTE in 08/08/19, the flow acceleration across the LVOT and degree of TR is better captured on the current study. Otherwise, there is no significant change.    LHC 07/12/18: CULPRIT LESION: Mid Cx lesion is 85% stenosed with 30-40% stenosed side branch in 2nd Mrg. A drug-eluting stent was successfully placed (crossing OM2) using a STENT SYNERGY DES 3X20 - post-dilated to 3.3 mm Post intervention, there is a 0% residual stenosis. ----------------------------------------- Mid LAD lesion is 30% stenosed. Prox RCA lesion is 25% stenosed. ----------------------------------------- The left ventricular systolic function is normal. The left ventricular ejection fraction is 55-65% by visual estimate. LV end diastolic pressure is normal.   SUMMARY Severe Single Vessel CAD mLCX (@ OM2) - s/p Successful DES PCI (Synergy DES 3.0 x 20 -- 3.3 mm) Otherwise mild disease in LAD & RCA Normal LVEF & normal to low EDP   Past Medical History:  Diagnosis Date   Allergy    Arthritis    back  Atrial fibrillation (HCC)    Atypical chest pain 06/30/2016   Back pain    DUE TO INJURY AT WORK ON05/2013   Bruises easily    Chronic lower back pain    Coronary artery disease    Depression    was on meds 2 yrs ago    Headache(784.0)    rarely   History of bronchitis    last time about 14yrs ago    Hypertension    takes Benicar daily   Hypothyroidism 06/30/2016   patient denies; states "i have never been told i had thyroid problems" and does not take any medications for thyroid   OSA (obstructive sleep apnea) 04/28/2020   Preoperative evaluation of a medical condition to rule out surgical contraindications (TAR required) 09/12/2020   Pure hypercholesterolemia 05/31/2019   Vitamin D deficiency    takes Vitamin D 2 times/wk   Weakness    and numbness right foot    Past Surgical History:  Procedure Laterality  Date   BACK SURGERY     CARPAL TUNNEL RELEASE Right    CESAREAN SECTION  1988   COLONOSCOPY     EXPLORATORY LAPAROTOMY  1991   "took out fatty tumor" (11/09/2012)   IR ANGIO INTRA EXTRACRAN SEL COM CAROTID INNOMINATE BILAT MOD SED  08/10/2019   IR ANGIO INTRA EXTRACRAN SEL COM CAROTID INNOMINATE BILAT MOD SED  10/07/2020   IR ANGIO VERTEBRAL SEL SUBCLAVIAN INNOMINATE BILAT MOD SED  10/07/2020   IR ANGIO VERTEBRAL SEL VERTEBRAL BILAT MOD SED  08/10/2019   IR US GUIDE VASC ACCESS RIGHT  08/10/2019   IR US GUIDE VASC ACCESS RIGHT  10/07/2020   LEFT HEART CATH AND CORONARY ANGIOGRAPHY N/A 08/06/2016   Procedure: Left Heart Cath and Coronary Angiography;  Surgeon: Lyn Records, MD;  [facility]: [facility] IN[facility];  Service: Cardiovascular;  Laterality: N/A;   LEFT HEART CATH AND CORONARY ANGIOGRAPHY N/A 07/12/2018   Procedure: LEFT HEART CATH AND CORONARY ANGIOGRAPHY;  Surgeon: Marykay Lex, MD;  Location: [facility] INVASIVE CV LAB;  Service: Cardiovascular;  Laterality: N/A;   LUMBAR LAMINECTOMY/DECOMPRESSION MICRODISCECTOMY  11/09/2012   "L3-4" (11/09/2012)   LUMBAR LAMINECTOMY/DECOMPRESSION MICRODISCECTOMY N/A 11/09/2012   Procedure: L3-L4 DECOMPRESSION AND MICRODISCECTOMY   (1 LEVEL);  Surgeon: Venita Lick, MD;  Location: [facility] OR;  Service: Orthopedics;  Laterality: N/A;    MEDICATIONS:  acetaminophen (TYLENOL) 500 MG tablet   apixaban (ELIQUIS) 5 MG TABS tablet   atorvastatin (LIPITOR) 80 MG tablet   cholecalciferol (VITAMIN D3) 25 MCG (1000 UNIT) tablet   ciprofloxacin (CIPRO) 500 MG tablet   diltiazem (CARDIZEM CD) 120 MG 24 hr capsule   docusate sodium (COLACE) 100 MG capsule   enoxaparin (LOVENOX) 150 MG/ML injection   ezetimibe (ZETIA) 10 MG tablet   fluticasone (FLONASE) 50 MCG/ACT nasal spray   furosemide (LASIX) 40 MG tablet   hydrOXYzine (ATARAX/VISTARIL) 25 MG tablet   levocetirizine (XYZAL) 5 MG tablet   Magnesium 250 MG TABS   methocarbamol (ROBAXIN) 500 MG tablet    Olmesartan-amLODIPine-HCTZ 20-5-12.5 MG TABS   ondansetron (ZOFRAN) 4 MG tablet   oxyCODONE-acetaminophen (PERCOCET) 5-325 MG tablet   topiramate (TOPAMAX) 100 MG tablet   No current facility-administered medications for this encounter.    Rica Mast, PhD, FNP-BC [facility]/Anesthesiology Phone: [phone] 10/20/2020 2:08 PM

## 2020-10-20 NOTE — Progress Notes (Signed)
Patient aware.

## 2020-10-20 NOTE — Telephone Encounter (Signed)
Pt asking for a letter for her work stating she will be out of work for her surg and for approximately how long. Pt asked if it can be sent to her email [email or web address]. Her surg date is sch'd for 10/27/20 and the best call back number is [phone].

## 2020-10-20 NOTE — Telephone Encounter (Signed)
Really depends on what she does for work, but likely 12 weeks

## 2020-10-20 NOTE — Progress Notes (Signed)
Can you let patient know that she has a uti and I have sent in antibiotics to go ahead and start taking

## 2020-10-21 ENCOUNTER — Telehealth (HOSPITAL_COMMUNITY): Payer: Self-pay | Admitting: Radiology

## 2020-10-21 ENCOUNTER — Other Ambulatory Visit (HOSPITAL_COMMUNITY): Payer: Self-pay | Admitting: Interventional Radiology

## 2020-10-21 DIAGNOSIS — I771 Stricture of artery: Secondary | ICD-10-CM

## 2020-10-21 NOTE — Telephone Encounter (Signed)
Returned pt's call, left VM. Pt to call back to schedule treatment for stenosis of the LICA with Deveshwar. JM

## 2020-10-22 ENCOUNTER — Telehealth (HOSPITAL_COMMUNITY): Payer: Self-pay | Admitting: Radiology

## 2020-10-22 NOTE — Telephone Encounter (Signed)
Second attempt to reach pt to schedule LICA stenosis treatment with Dr. Corliss Skains. Left VM. JM

## 2020-10-23 ENCOUNTER — Other Ambulatory Visit: Payer: Self-pay

## 2020-10-23 ENCOUNTER — Other Ambulatory Visit (HOSPITAL_COMMUNITY): Admission: RE | Admit: 2020-10-23 | Payer: 59 | Source: Ambulatory Visit

## 2020-10-23 ENCOUNTER — Encounter: Payer: Self-pay | Admitting: Internal Medicine

## 2020-10-23 DIAGNOSIS — R051 Acute cough: Secondary | ICD-10-CM

## 2020-10-23 MED ORDER — AZITHROMYCIN 250 MG PO TABS: ORAL_TABLET | ORAL | 0 refills | Status: AC

## 2020-10-24 ENCOUNTER — Encounter: Payer: Self-pay | Admitting: Internal Medicine

## 2020-10-24 ENCOUNTER — Telehealth (HOSPITAL_COMMUNITY): Payer: Self-pay | Admitting: Radiology

## 2020-10-24 LAB — SARS-COV-2, NAA 2 DAY TAT

## 2020-10-24 LAB — NOVEL CORONAVIRUS, NAA: SARS-CoV-2, NAA: NOT DETECTED

## 2020-10-24 NOTE — Telephone Encounter (Signed)
Spoke to pt about scheduling her treatment for stenosis with Deveshwar. We are holding 11/05/20 for her procedure.Morrie Sheldon or myself will call her on Monday, October 17th with info concerning stopping her Eliquis and starting Plavix. Pt agrees with this plan of care. JM

## 2020-10-26 ENCOUNTER — Other Ambulatory Visit: Payer: Self-pay | Admitting: Nurse Practitioner

## 2020-10-26 ENCOUNTER — Other Ambulatory Visit: Payer: Self-pay | Admitting: Internal Medicine

## 2020-10-26 ENCOUNTER — Other Ambulatory Visit: Payer: Self-pay | Admitting: Cardiovascular Disease

## 2020-10-26 DIAGNOSIS — L2489 Irritant contact dermatitis due to other agents: Secondary | ICD-10-CM

## 2020-10-27 ENCOUNTER — Ambulatory Visit: Admit: 2020-10-27 | Payer: 59 | Admitting: Orthopaedic Surgery

## 2020-10-27 ENCOUNTER — Encounter: Payer: Self-pay | Admitting: Internal Medicine

## 2020-10-27 SURGERY — ARTHROPLASTY, HIP, TOTAL, ANTERIOR APPROACH
Anesthesia: Spinal | Site: Hip | Laterality: Left

## 2020-10-29 ENCOUNTER — Telehealth: Payer: Self-pay

## 2020-10-29 NOTE — Telephone Encounter (Signed)
[name], CMA: Patient has stenosis in her left internal carotid artery that needs angioplasty with possible stenting. This was the message I received from [facility] from Radiology. thanks

## 2020-10-29 NOTE — Telephone Encounter (Signed)
Per pre op provider I called and left message for the requesting office to please call back and clarify procedure to be done.

## 2020-10-29 NOTE — Telephone Encounter (Signed)
Patient with diagnosis of afib on Eliquis for anticoagulation.    Procedure: ICA angioplasty/possible stenting Date of procedure: 11/05/20  CHA2DS2-VASc Score = 5  This indicates a 7.2% annual risk of stroke. The patient's score is based upon: CHF History: 0 HTN History: 1 Diabetes History: 0 Stroke History: 2 Vascular Disease History: 1 Age Score: 0 Gender Score: 1   Stroke of MCA July 2021.  It was also noted that patient had subdural hematoma on scan.  (08/07/19)   CrCl 64 (with adjusted body weight) Platelet count 310K  Recommend pt hold Eliquis for 1 day prior to procedure and resume as soon as safely possible after. If longer hold is needed, pt will require bridging with Lovenox (was provided bridging instructions for 10/17 THA that was canceled).

## 2020-10-29 NOTE — Telephone Encounter (Signed)
   Primary Cardiologist: Chilton Si, MD  Chart reviewed as part of pre-operative protocol coverage. Given past medical history and time since last visit, based on ACC/AHA guidelines, SHANEDRA LAVE would be at acceptable risk for the planned procedure without further cardiovascular testing.   Patient with diagnosis of afib on Eliquis for anticoagulation.     Procedure: ICA angioplasty/possible stenting Date of procedure: 11/05/20   CHA2DS2-VASc Score = 5  This indicates a 7.2% annual risk of stroke. The patient's score is based upon: CHF History: 0 HTN History: 1 Diabetes History: 0 Stroke History: 2 Vascular Disease History: 1 Age Score: 0 Gender Score: 1   Stroke of MCA July 2021.  It was also noted that patient had subdural hematoma on scan.  (08/07/19)   CrCl 64 (with adjusted body weight) Platelet count 310K   Recommend pt hold Eliquis for 1 day prior to procedure and resume as soon as safely possible after. If longer hold is needed, pt will require bridging with Lovenox (was provided bridging instructions for 10/17 THA that was canceled).  I will route this recommendation to the requesting party via Epic fax function and remove from pre-op pool.  Please call with questions.  Thomasene Ripple. Maryann Mccall NP-C    10/29/2020, 4:28 PM [facility] [address] Office [phone] Fax [phone]

## 2020-10-29 NOTE — Telephone Encounter (Signed)
Please contact requesting office and ask about details surrounding "intervention".  We do not provide blanket coverage.  Once we know details surrounding procedure/surgery we will be able to offer preoperative cardiac evaluation.  Thank you.  Thomasene Ripple. Naomie Crow NP-C    10/29/2020, 1:50 PM [facility] [address] Office [phone] Fax [phone]

## 2020-10-29 NOTE — Telephone Encounter (Signed)
   [facility] Pre-operative Risk Assessment    Patient Name: [name]  DOB: 01-06-1961 MRN: [id number]  HEARTCARE STAFF:  - IMPORTANT!!!!!! Under Visit Info/Reason for Call, type in Other and utilize the format Clearance MM/DD/YY or Clearance TBD. Do not use dashes or single digits. - Please review there is not already an duplicate clearance open for this procedure. - If request is for dental extraction, please clarify the # of teeth to be extracted. - If the patient is currently at the dentist's office, call Pre-Op Callback Staff (MA/nurse) to input urgent request.  - If the patient is not currently in the dentist office, please route to the Pre-Op pool.  Request for surgical clearance:  What type of surgery is being performed? Intervention  When is this surgery scheduled? November 05, 2020  What type of clearance is required (medical clearance vs. Pharmacy clearance to hold med vs. Both)? Pharmacy  Are there any medications that need to be held prior to surgery and how long? Eliquis  Practice name and name of physician performing surgery? Dr. Glenford Bayley MD, [facility]  What is the office phone number? [phone]   7.   What is the office fax number? [phone]  8.   Anesthesia type (None, local, MAC, general) ? None   Monia Pouch 10/29/2020, 12:18 PM  _________________________________________________________________   (provider comments below)

## 2020-11-03 ENCOUNTER — Encounter: Payer: 59 | Admitting: Internal Medicine

## 2020-11-03 ENCOUNTER — Other Ambulatory Visit (HOSPITAL_COMMUNITY): Payer: Self-pay | Admitting: Physician Assistant

## 2020-11-03 ENCOUNTER — Other Ambulatory Visit (HOSPITAL_COMMUNITY): Payer: Self-pay | Admitting: Interventional Radiology

## 2020-11-03 DIAGNOSIS — I6522 Occlusion and stenosis of left carotid artery: Secondary | ICD-10-CM | POA: Diagnosis not present

## 2020-11-03 DIAGNOSIS — I6523 Occlusion and stenosis of bilateral carotid arteries: Secondary | ICD-10-CM | POA: Diagnosis not present

## 2020-11-04 ENCOUNTER — Emergency Department (HOSPITAL_COMMUNITY)
Admission: EM | Admit: 2020-11-04 | Discharge: 2020-11-04 | Disposition: A | Discharge status: Home / Self Care | Payer: 59 | Source: Home / Self Care | Attending: Emergency Medicine | Admitting: Emergency Medicine

## 2020-11-04 ENCOUNTER — Other Ambulatory Visit: Payer: Self-pay

## 2020-11-04 ENCOUNTER — Encounter (HOSPITAL_COMMUNITY): Payer: Self-pay | Admitting: Interventional Radiology

## 2020-11-04 ENCOUNTER — Emergency Department (HOSPITAL_COMMUNITY): Payer: 59

## 2020-11-04 ENCOUNTER — Other Ambulatory Visit: Payer: Self-pay | Admitting: Radiology

## 2020-11-04 ENCOUNTER — Telehealth (HOSPITAL_COMMUNITY): Payer: Self-pay | Admitting: Radiology

## 2020-11-04 ENCOUNTER — Encounter (HOSPITAL_COMMUNITY): Payer: Self-pay | Admitting: Emergency Medicine

## 2020-11-04 DIAGNOSIS — E039 Hypothyroidism, unspecified: Secondary | ICD-10-CM | POA: Insufficient documentation

## 2020-11-04 DIAGNOSIS — Z79899 Other long term (current) drug therapy: Secondary | ICD-10-CM | POA: Insufficient documentation

## 2020-11-04 DIAGNOSIS — I6522 Occlusion and stenosis of left carotid artery: Secondary | ICD-10-CM

## 2020-11-04 DIAGNOSIS — I951 Orthostatic hypotension: Secondary | ICD-10-CM | POA: Insufficient documentation

## 2020-11-04 DIAGNOSIS — R202 Paresthesia of skin: Secondary | ICD-10-CM | POA: Insufficient documentation

## 2020-11-04 DIAGNOSIS — I2510 Atherosclerotic heart disease of native coronary artery without angina pectoris: Secondary | ICD-10-CM | POA: Insufficient documentation

## 2020-11-04 DIAGNOSIS — R42 Dizziness and giddiness: Secondary | ICD-10-CM

## 2020-11-04 DIAGNOSIS — Z7901 Long term (current) use of anticoagulants: Secondary | ICD-10-CM | POA: Insufficient documentation

## 2020-11-04 DIAGNOSIS — I4891 Unspecified atrial fibrillation: Secondary | ICD-10-CM | POA: Insufficient documentation

## 2020-11-04 LAB — SARS CORONAVIRUS 2 (TAT 6-24 HRS): SARS Coronavirus 2: NEGATIVE

## 2020-11-04 LAB — BASIC METABOLIC PANEL
Anion gap: 8 (ref 5–15)
BUN: 11 mg/dL (ref 6–20)
CO2: 25 mmol/L (ref 22–32)
Calcium: 9.4 mg/dL (ref 8.9–10.3)
Chloride: 104 mmol/L (ref 98–111)
Creatinine, Ser: 1 mg/dL (ref 0.44–1.00)
GFR, Estimated: >60 mL/min (ref >60)
Glucose, Bld: 119 mg/dL — ABNORMAL HIGH (ref 70–99)
Potassium: 3.2 mmol/L — ABNORMAL LOW (ref 3.5–5.1)
Sodium: 137 mmol/L (ref 135–145)

## 2020-11-04 LAB — MAGNESIUM: Magnesium: 2.2 mg/dL (ref 1.7–2.4)

## 2020-11-04 LAB — CBC WITH DIFFERENTIAL/PLATELET
Abs Immature Granulocytes: 0.02 10*3/uL (ref 0.00–0.07)
Basophils Absolute: 0 10*3/uL (ref 0.0–0.1)
Basophils Relative: 1 %
Eosinophils Absolute: 0.1 10*3/uL (ref 0.0–0.5)
Eosinophils Relative: 1 %
HCT: 37.6 % (ref 36.0–46.0)
Hemoglobin: 12 g/dL (ref 12.0–15.0)
Immature Granulocytes: 0 %
Lymphocytes Relative: 32 %
Lymphs Abs: 2.1 10*3/uL (ref 0.7–4.0)
MCH: 28.3 pg (ref 26.0–34.0)
MCHC: 31.9 g/dL (ref 30.0–36.0)
MCV: 88.7 fL (ref 80.0–100.0)
Monocytes Absolute: 0.6 10*3/uL (ref 0.1–1.0)
Monocytes Relative: 9 %
Neutro Abs: 3.9 10*3/uL (ref 1.7–7.7)
Neutrophils Relative %: 57 %
Platelets: 282 10*3/uL (ref 150–400)
RBC: 4.24 MIL/uL (ref 3.87–5.11)
RDW: 13.6 % (ref 11.5–15.5)
WBC: 6.6 10*3/uL (ref 4.0–10.5)
nRBC: 0 % (ref 0.0–0.2)

## 2020-11-04 LAB — TROPONIN I (HIGH SENSITIVITY): Troponin I (High Sensitivity): 5 ng/L (ref <18)

## 2020-11-04 IMAGING — DX DG CHEST 1V PORT
1 series · 1 of 1 positions shown · non-contrast
Comparison: [DATE]

CLINICAL DATA: Lightheaded

EXAM:
PORTABLE CHEST 1 VIEW

[chest]
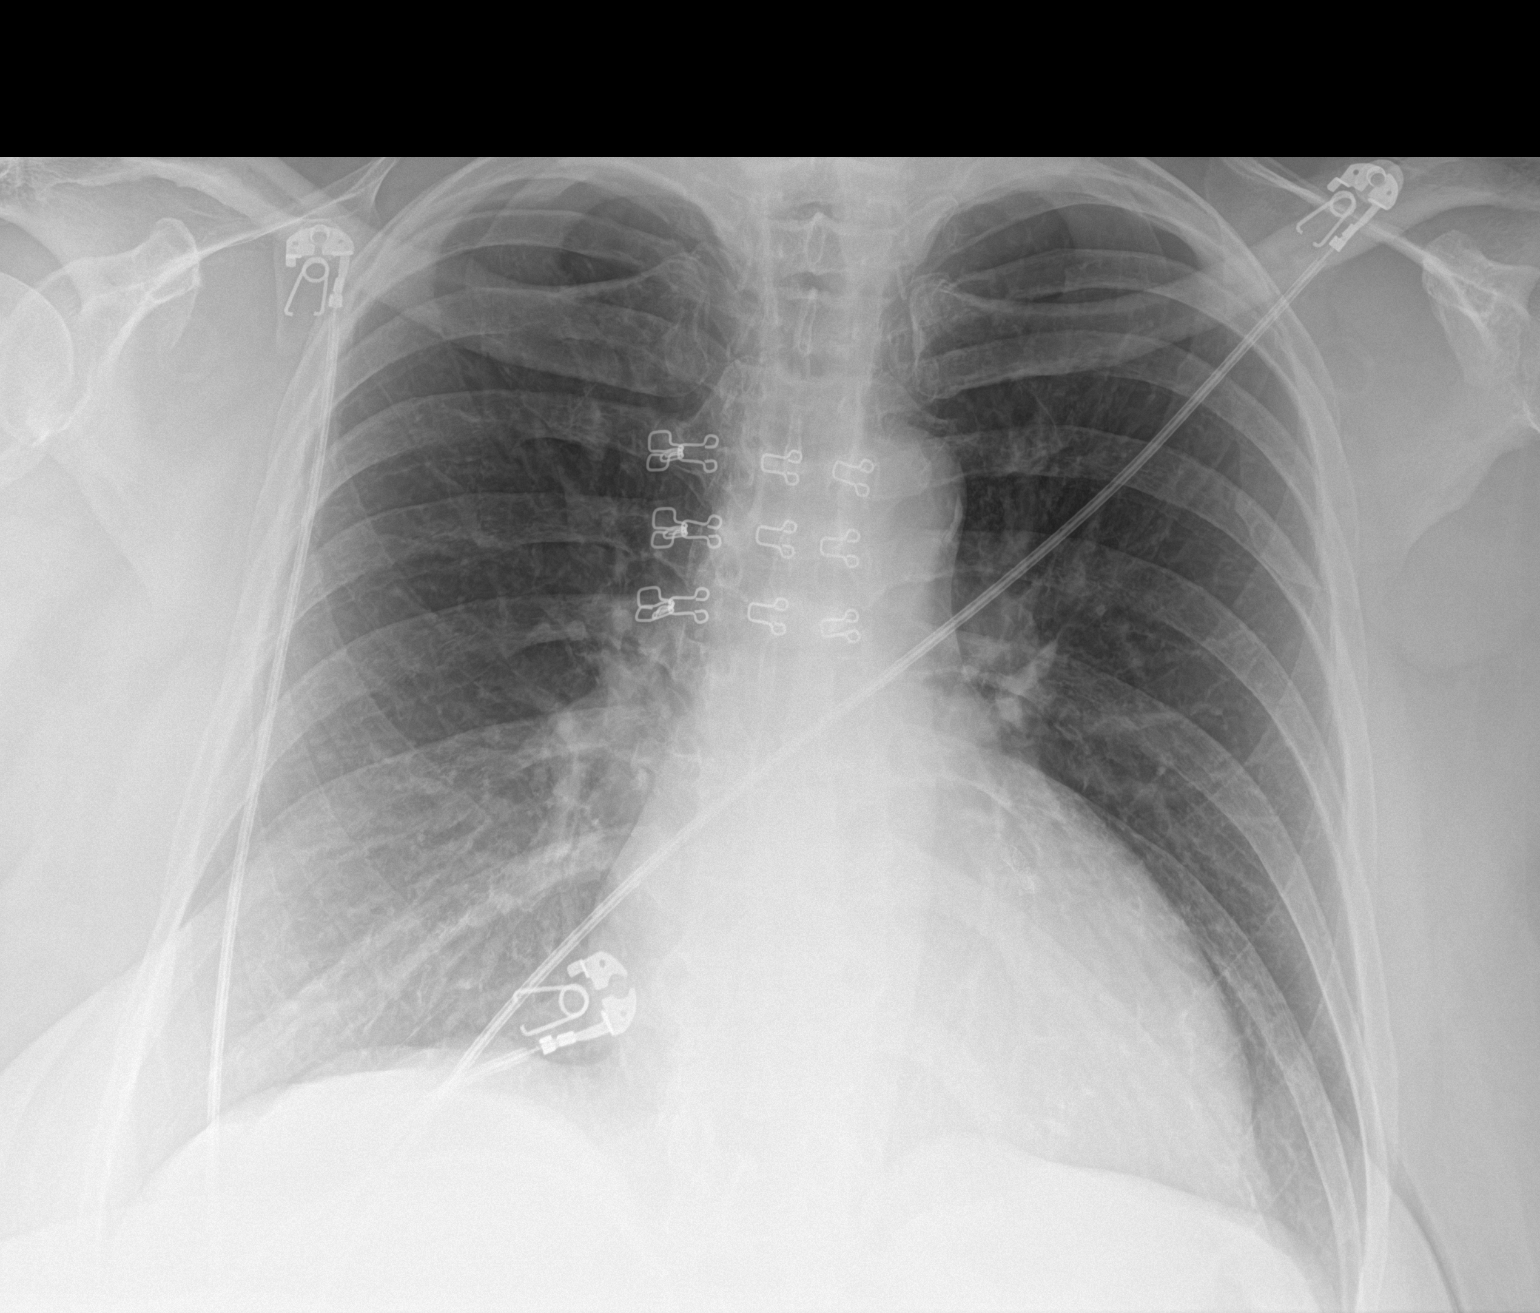

[1 of 1 positions shown; findings below may reference images not displayed]

FINDINGS: Borderline cardiomegaly. Lungs clear. No effusions or edema. No
acute bony abnormality.
IMPRESSION: Borderline cardiomegaly.  No active disease.

## 2020-11-04 IMAGING — CT CT HEAD W/O CM
4 series · 16 of 47 positions shown, 18 images · non-contrast
Comparison: CT head [DATE] and earlier

CLINICAL DATA: Left hand numbness for 2 days. Dizziness starting
last night. Patient scheduled for stent placement in the left
carotid artery tomorrow.

EXAM:
CT HEAD WITHOUT CONTRAST
TECHNIQUE: Contiguous axial images were obtained from the base of the skull
through the vertex without intravenous contrast.

[Series 3: head without · axial · non-contrast · 0.42mm/px · z∈[+12,+132]mm · 7 of 32 slices shown, 9 images]
[im 4/32  brain]
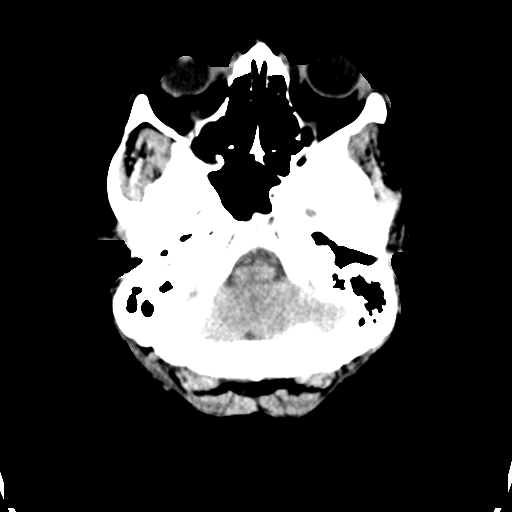
[im 4/32  bone]
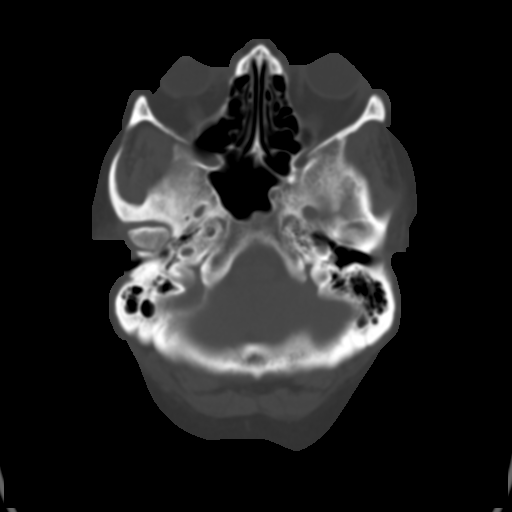
[im 8/32  brain]
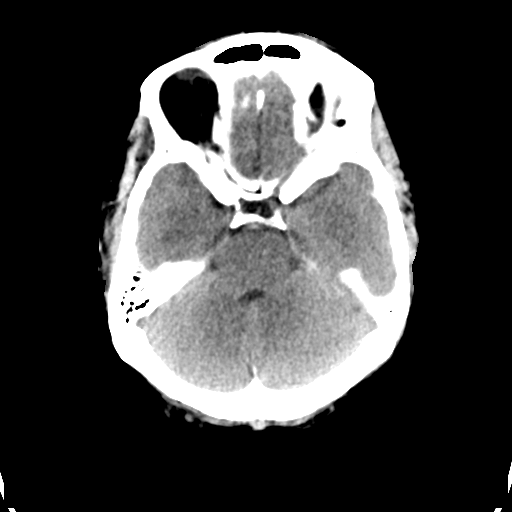
[im 12/32  brain]
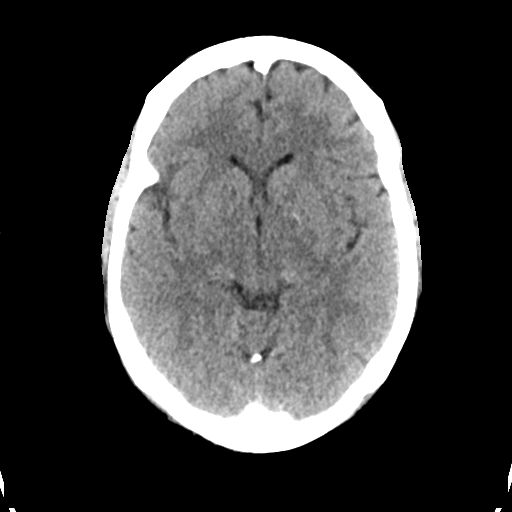
[im 16/32  brain]
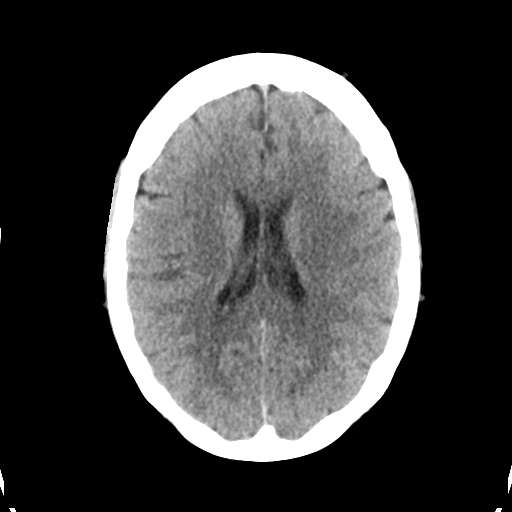
[im 20/32  brain]
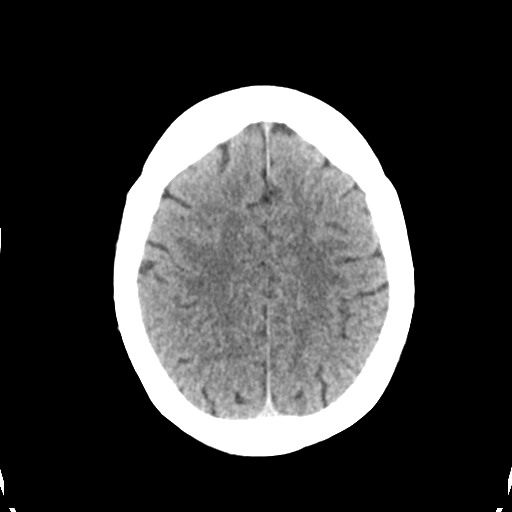
[im 20/32  bone]
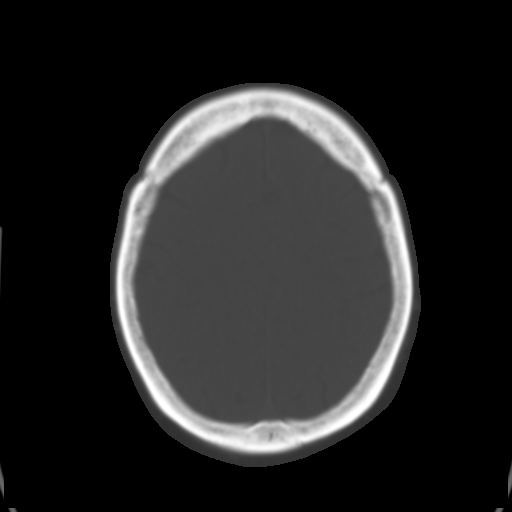
[im 24/32  brain]
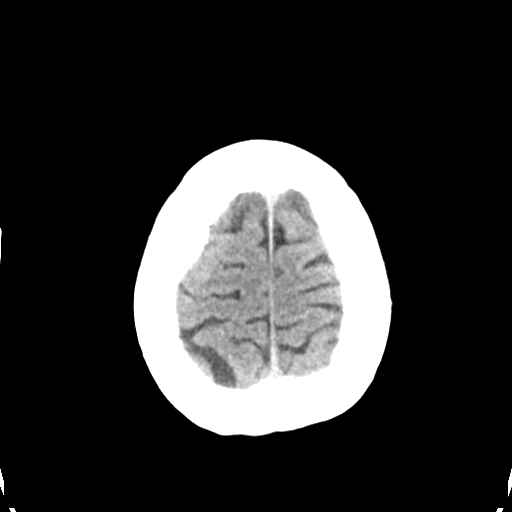
[im 28/32  brain]
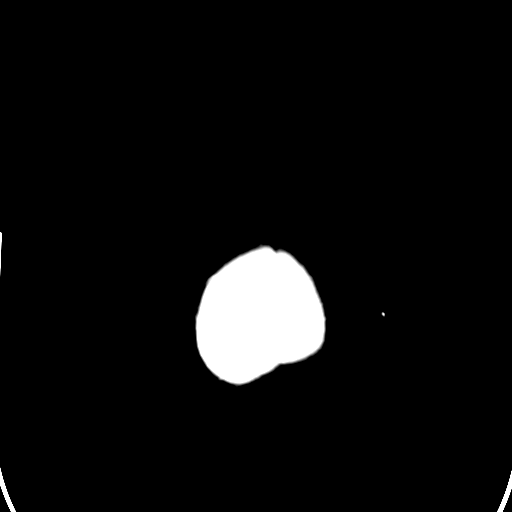

[Series 4: head bone · axial · 0.42mm/px · z∈[+11,+43]mm · 3 of 80 slices shown]
[im 8/80  bone]
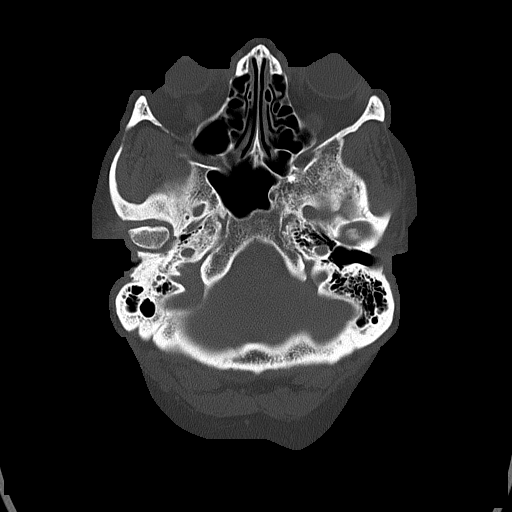
[im 16/80  bone]
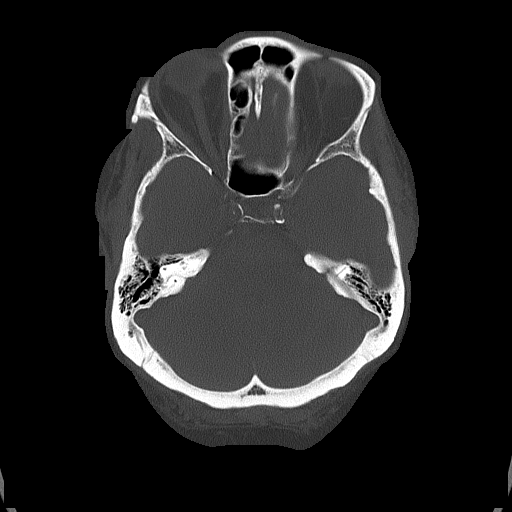
[im 24/80  bone]
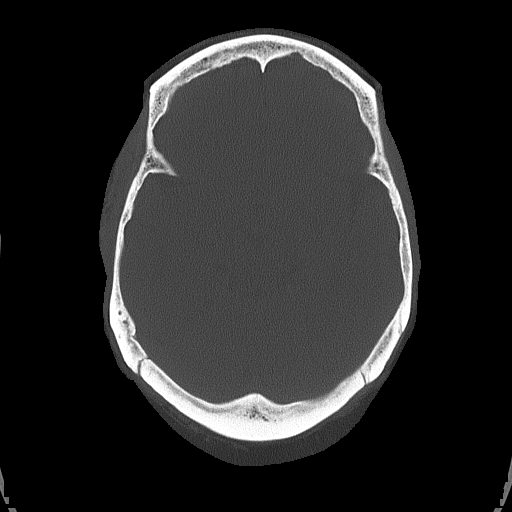

[Series 5: head without cor · coronal · non-contrast · 0.28mm/px · 3 of 65 slices shown]
[im 22/65  brain]
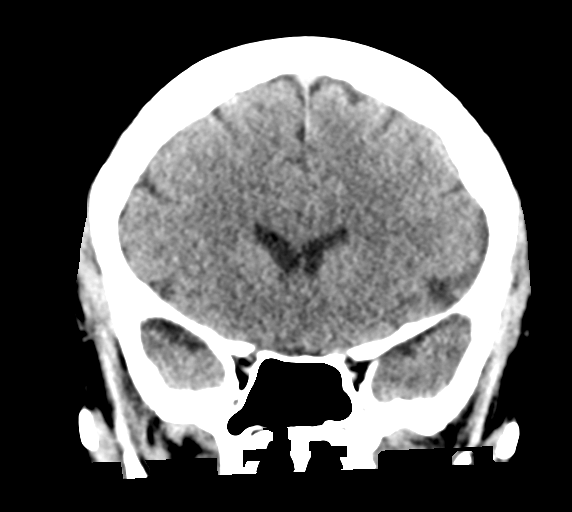
[im 29/65  brain]
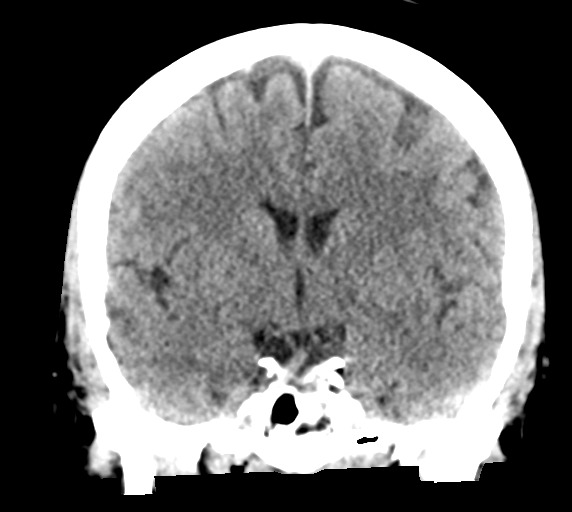
[im 36/65  brain]
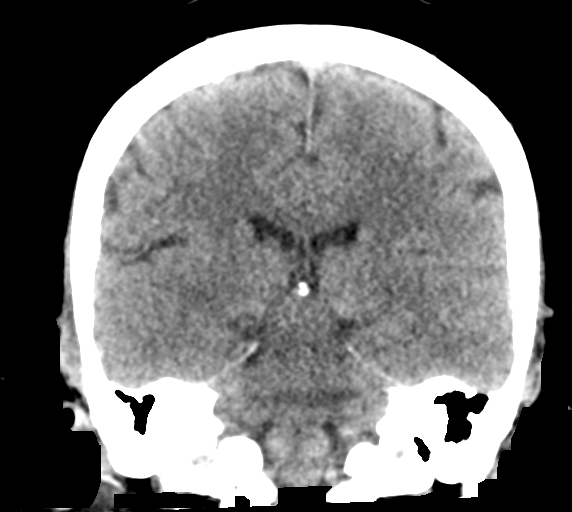

[Series 6: head without sag · sagittal · non-contrast · 0.31mm/px · 3 of 58 slices shown]
[im 20/58  brain]
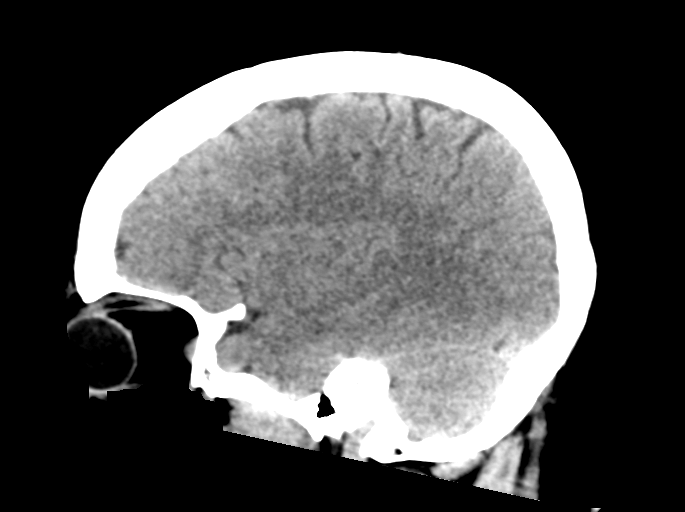
[im 29/58  brain]
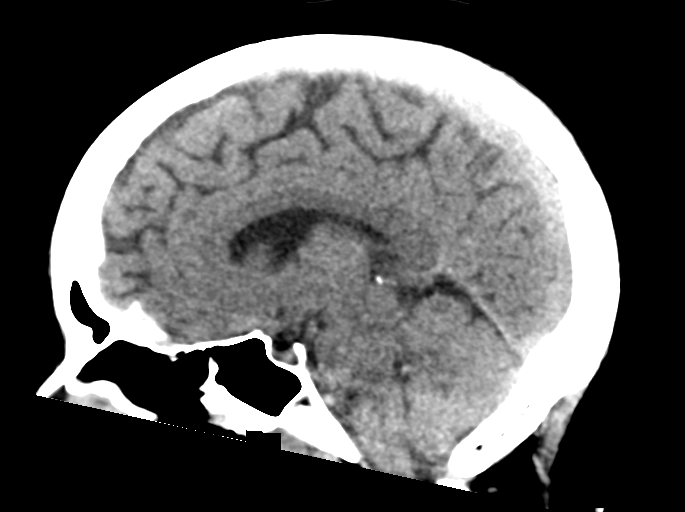
[im 39/58  brain]
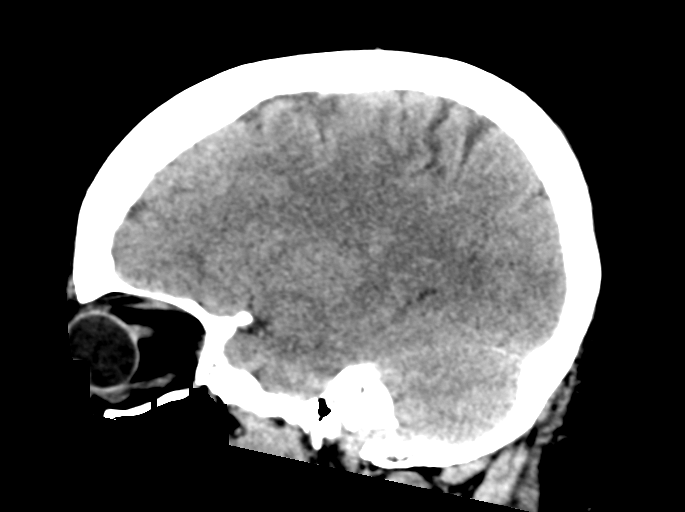

[16 of 47 positions shown; findings below may reference images not displayed]

FINDINGS: Brain: No evidence of acute infarction, hemorrhage, hydrocephalus,
extra-axial collection or mass lesion/mass effect.

Vascular: No hyperdense vessel. Tiny linear hyperdensity in the left
basal ganglia is unchanged compared to exams dating back to
[DATE], likely vascular calcification.

Skull: Normal. Negative for fracture or focal lesion.

Sinuses/Orbits: No acute finding.

Other: None.
IMPRESSION: No acute intracranial abnormality.

## 2020-11-04 NOTE — ED Notes (Signed)
Patient verbalizes understanding of discharge instructions. Opportunity for questioning and answers were provided. Armband removed by staff, pt discharged from ED via wheelchair.  

## 2020-11-04 NOTE — Discharge Instructions (Addendum)
Please follow up with PCP regarding blood pressure medicine

## 2020-11-04 NOTE — ED Provider Notes (Signed)
Emergency Medicine Provider Triage Evaluation Note  [name] , a 60 y.o. female  was evaluated in triage.  Pt complains of intermittent left hand numbness x 2 days with dizziness starting last night. No symptoms in triage. She is due for left carotid stent placement tomorrow. Is currently on Plavix and ASA.   Review of Systems  Positive: Intermittent lightheadedness, intermittent L hand numbness Negative: CP, SOB, headache  Physical Exam  BP (!) 145/87 (BP Location: Left Arm)   Pulse 72   Temp 98.5 F (36.9 C) (Oral)   Resp 16   LMP 06/03/2011   SpO2 100%  Gen:   Awake, no distress   Resp:  Normal effort  MSK:   Moves extremities without difficulty  Other:  5/5 strength all extremities, sensation in tact, PERRLA  Medical Decision Making  Medically screening exam initiated at 10:03 AM.  Appropriate orders placed.  WILLA BROCKS was informed that the remainder of the evaluation will be completed by another provider, this initial triage assessment does not replace that evaluation, and the importance of remaining in the ED until their evaluation is complete.     Su Monks, PA-C 11/04/20 1008    Virgina Norfolk, DO 11/04/20 1023

## 2020-11-04 NOTE — ED Triage Notes (Signed)
Pt arrives via EMS from home with left hand numbness for 2 days. Pt began having dizziness last night. Pt due for stent placement in left carotid tomorrow. Denies CP or SOB.

## 2020-11-04 NOTE — Progress Notes (Addendum)
I spoke to [name] and updated patient 's history.  After speaking for several minutes, patient told me she is in the ED.  [name] has has left hand numbness for 2 days, last night she began to experience lightheadedness and dizziness. Patient went to work this am and after speaking to supervisor about what is going on and the two decided to call a Dr.  Patient was instructed to call EMS and go to the ED. [name] did not know the name dose for Plavix.  62- [name] is still in the ED. Patient said that they are waiting for more labs to return before they decide to admit or discharge. I went over instructions with [name], if she goes home.  [name] knows that if she goes home that she should bring the mask to CPAP back to the hospital, patient said if she is admitted, she will have her son to bring itto the hospital tonight.

## 2020-11-04 NOTE — Telephone Encounter (Signed)
Received a call from [name], very upset and having new stroke like symptoms. She is having dizziness, and numbness. I encouraged pt and her boss at work to call 911 for an ambulance and to go to ED for stroke work up. They agrees with this plan of care. JM

## 2020-11-04 NOTE — ED Provider Notes (Signed)
[facility] EMERGENCY DEPARTMENT Provider Note   CSN: [id number] Arrival date & time: 11/04/20  5176     History Chief Complaint  Patient presents with   Numbness    [name] is a 60 y.o. female.  This is a 60 y.o. female with significant medical history as below, including  CAD, HTN, afib who presents to the ED with complaint of left hand tingling.   Location:  left hand Duration:  2 days Onset:  gradual Timing:  intermittent Description:  tingling to palmar aspect of left hand to fingertips Severity:  mild Exacerbating/Alleviating Factors:  none perceived Associated Symptoms:  light headed when standing quickly Pertinent Negatives:  no fevers or chills, no numbness, no HA, no vision changes, no dib, no abd pain, no change to daily medications or diet     The history is provided by the patient. No language interpreter was used.      Past Medical History:  Diagnosis Date   Allergy    Arthritis    back    Atrial fibrillation (HCC)    Atypical chest pain 06/30/2016   Back pain    DUE TO INJURY AT WORK ON05/2013   Bruises easily    Chronic lower back pain    Coronary artery disease    Depression    was on meds 2 yrs ago    Headache(784.0)    rarely   History of bronchitis    last time about 47yrs ago    Hypertension    takes Benicar daily   Hypothyroidism 06/30/2016   patient denies; states "i have never been told i had thyroid problems" and does not take any medications for thyroid   OSA (obstructive sleep apnea) 04/28/2020   Preoperative evaluation of a medical condition to rule out surgical contraindications (TAR required) 09/12/2020   Pure hypercholesterolemia 05/31/2019   Stroke (HCC)    no residual   Vitamin D deficiency    takes Vitamin D 2 times/wk   Weakness    and numbness right foot    Patient Active Problem List   Diagnosis Date Noted   Internal carotid artery stenosis, left 09/23/2020   Preoperative evaluation  of a medical condition to rule out surgical contraindications (TAR required) 09/12/2020   Paroxysmal atrial fibrillation (HCC) 04/28/2020   OSA (obstructive sleep apnea) 04/28/2020   Atrial fibrillation with rapid ventricular response (HCC) 03/17/2020   Hypokalemia    Pulmonary hypertension, primary (HCC)    Atrial fibrillation with RVR (HCC) 03/16/2020   Elevated troponin    Primary osteoarthritis of right knee 11/20/2019   Cerebrovascular accident (CVA) (HCC) 10/18/2019   ICAO (internal carotid artery occlusion), right 10/18/2019   Primary osteoarthritis of left hip 09/11/2019   SAH (subarachnoid hemorrhage) (HCC) 08/07/2019   Acute embolic stroke (HCC)    Left hip pain 06/25/2019   Severe left groin pain 06/21/2019   Bilateral carotid artery stenosis 06/21/2019   Pure hypercholesterolemia 05/31/2019   CAD in native artery 04/09/2019   Complicated migraine 08/23/2018   Hyperglycemia 07/10/2018   Abnormal stress test 08/06/2016   Essential hypertension 06/30/2016   Hypothyroidism 06/30/2016    Past Surgical History:  Procedure Laterality Date   BACK SURGERY     CARPAL TUNNEL RELEASE Right    CESAREAN SECTION  1988   COLONOSCOPY     EXPLORATORY LAPAROTOMY  1991   "took out fatty tumor" (11/09/2012)   IR ANGIO INTRA EXTRACRAN SEL COM CAROTID INNOMINATE BILAT MOD SED  08/10/2019   IR ANGIO INTRA EXTRACRAN SEL COM CAROTID INNOMINATE BILAT MOD SED  10/07/2020   IR ANGIO VERTEBRAL SEL SUBCLAVIAN INNOMINATE BILAT MOD SED  10/07/2020   IR ANGIO VERTEBRAL SEL VERTEBRAL BILAT MOD SED  08/10/2019   IR US GUIDE VASC ACCESS RIGHT  08/10/2019   IR US GUIDE VASC ACCESS RIGHT  10/07/2020   LEFT HEART CATH AND CORONARY ANGIOGRAPHY N/A 08/06/2016   Procedure: Left Heart Cath and Coronary Angiography;  Surgeon: Lyn Records, MD;  Location: [facility] INVASIVE CV LAB;  Service: Cardiovascular;  Laterality: N/A;   LEFT HEART CATH AND CORONARY ANGIOGRAPHY N/A 07/12/2018   Procedure: LEFT HEART CATH AND  CORONARY ANGIOGRAPHY;  Surgeon: Marykay Lex, MD;  Location: [facility] INVASIVE CV LAB;  Service: Cardiovascular;  Laterality: N/A;   LUMBAR LAMINECTOMY/DECOMPRESSION MICRODISCECTOMY  11/09/2012   "L3-4" (11/09/2012)   LUMBAR LAMINECTOMY/DECOMPRESSION MICRODISCECTOMY N/A 11/09/2012   Procedure: L3-L4 DECOMPRESSION AND MICRODISCECTOMY   (1 LEVEL);  Surgeon: Venita Lick, MD;  Location: [facility] OR;  Service: Orthopedics;  Laterality: N/A;     OB History   No obstetric history on file.     Family History  Problem Relation Age of Onset   Heart disease Mother    CAD Mother    Stroke Mother    Heart disease Sister    Heart attack Sister    Liver disease Brother    Other Father        unknown medical history   CAD Brother    Colon cancer Neg Hx    Esophageal cancer Neg Hx    Rectal cancer Neg Hx    Stomach cancer Neg Hx     Social History   Tobacco Use   Smoking status: Never   Smokeless tobacco: Never  Vaping Use   Vaping Use: Never used  Substance Use Topics   Alcohol use: Not Currently    Alcohol/week: 2.0 standard drinks    Types: 2 Glasses of wine per week    Comment: social- 2 wine or 2 beers   Drug use: Never    Home Medications Prior to Admission medications   Medication Sig Start Date End Date Taking? Authorizing Provider  acetaminophen (TYLENOL) 500 MG tablet Take 500-1,000 mg by mouth every 6 (six) hours as needed (pain or headaches).    [provider]  apixaban (ELIQUIS) 5 MG TABS tablet Take 1 tablet (5 mg total) by mouth 2 (two) times daily. 04/17/20   Ronney Asters, NP  aspirin EC 81 MG tablet Take 81 mg by mouth daily. Swallow whole.- Patient said that it is not EC.    [provider]  atorvastatin (LIPITOR) 80 MG tablet Take 1 tablet (80 mg total) by mouth daily at 6 PM. 10/13/20   Dorothyann Peng, MD  cholecalciferol (VITAMIN D3) 25 MCG (1000 UNIT) tablet Take 1,000 Units by mouth daily.     [provider]  diltiazem (CARDIZEM CD)  120 MG 24 hr capsule TAKE 1 CAPSULE BY MOUTH EVERY DAY 09/22/20   Dorothyann Peng, MD  docusate sodium (COLACE) 100 MG capsule Take 1 capsule (100 mg total) by mouth daily as needed. 10/20/20 10/20/21  Cristie Hem, PA-C  enoxaparin (LOVENOX) 150 MG/ML injection Inject 1 mL (150 mg total) into the skin daily. Patient taking differently: Inject 150 mg into the skin daily. For hip replacement 10/16/20   Chilton Si, MD  ezetimibe (ZETIA) 10 MG tablet TAKE 1 TABLET BY MOUTH EVERY DAY 11/02/19  Chilton Si, MD  fluticasone [facility]) 50 MCG/ACT nasal spray PLACE 2 SPRAYS IN BOTH NOSTRILS DAILY AS NEEDED FOR ALLERGIES OR RHINITIS 10/13/20   Dorothyann Peng, MD  furosemide (LASIX) 40 MG tablet DAILY FOR 3 DAYS AND THEN AS DIRECTED 10/27/20   Chilton Si, MD  hydrOXYzine (ATARAX/VISTARIL) 25 MG tablet Take 1 tablet (25 mg total) by mouth 3 (three) times daily as needed. 08/21/19   Arnette Felts, FNP  levocetirizine (XYZAL) 5 MG tablet TAKE 1 TABLET BY MOUTH EVERY DAY 05/15/20   Charlesetta Ivory, NP  Magnesium 250 MG TABS TAKE 1 TABLET BY MOUTH DAILY WITH EVENING MEALS 10/13/20   Dorothyann Peng, MD  methocarbamol (ROBAXIN) 500 MG tablet Take 1 tablet (500 mg total) by mouth 2 (two) times daily as needed. To be taken after surgery 10/20/20   Cristie Hem, PA-C  mometasone (ELOCON) 0.1 % cream APPLY TO AFFECTED AREA EVERY DAY 10/29/20   Arnette Felts, FNP  Olmesartan-amLODIPine-HCTZ 20-5-12.5 MG TABS TAKE 1 TABLET BY MOUTH EVERY DAY 10/03/20   Dorothyann Peng, MD  olmesartan-hydrochlorothiazide (BENICAR HCT) 40-25 MG tablet TAKE 1 TABLET BY MOUTH EVERY DAY 10/29/20   Dorothyann Peng, MD  ondansetron (ZOFRAN) 4 MG tablet Take 1 tablet (4 mg total) by mouth every 8 (eight) hours as needed for nausea or vomiting. 10/20/20   Cristie Hem, PA-C  oxyCODONE-acetaminophen (PERCOCET) 5-325 MG tablet Take 1-2 tablets by mouth every 6 (six) hours as needed. To be taken after surgery Patient taking  differently: Take 1-2 tablets by mouth every 6 (six) hours as needed. To be taken after surgery- hip 10/20/20   Cristie Hem, PA-C  ticagrelor (BRILINTA) 90 MG TABS tablet Take 1 tablet (90 mg total) by mouth 2 (two) times daily. 11/05/20 02/03/21  Boisseau, Mayme Genta, PA  topiramate (TOPAMAX) 100 MG tablet Take 1 tablet (100 mg total) by mouth at bedtime. 09/09/20   Levert Feinstein, MD    Allergies    Other  Review of Systems   Review of Systems  Constitutional:  Negative for chills and fever.  HENT:  Negative for facial swelling and trouble swallowing.   Eyes:  Negative for photophobia and visual disturbance.  Respiratory:  Negative for cough and shortness of breath.   Cardiovascular:  Negative for chest pain and palpitations.  Gastrointestinal:  Negative for abdominal pain, nausea and vomiting.  Endocrine: Negative for polydipsia and polyuria.  Genitourinary:  Negative for difficulty urinating and hematuria.  Musculoskeletal:  Negative for gait problem and joint swelling.  Skin:  Negative for pallor and rash.  Neurological:  Positive for light-headedness. Negative for syncope and headaches.       L hand tingling   Psychiatric/Behavioral:  Negative for agitation and confusion.    Physical Exam Updated Vital Signs BP (!) 145/66   Pulse 63   Temp 98.4 F (36.9 C) (Oral)   Resp (!) 22   LMP 06/03/2011   SpO2 100%   Physical Exam Vitals and nursing note reviewed.  Constitutional:      General: She is not in acute distress.    Appearance: Normal appearance.  HENT:     Head: Normocephalic and atraumatic.     Right Ear: External ear normal.     Left Ear: External ear normal.     Nose: Nose normal.     Mouth/Throat:     Mouth: Mucous membranes are moist.  Eyes:     General: No scleral icterus.       Right eye:  No discharge.        Left eye: No discharge.     Extraocular Movements: Extraocular movements intact.     Pupils: Pupils are equal, round, and reactive to light.   Cardiovascular:     Rate and Rhythm: Normal rate and regular rhythm.     Pulses: Normal pulses.     Heart sounds: Normal heart sounds.  Pulmonary:     Effort: Pulmonary effort is normal. No respiratory distress.     Breath sounds: Normal breath sounds.  Abdominal:     General: Abdomen is flat.     Tenderness: There is no abdominal tenderness.  Musculoskeletal:        General: Normal range of motion.     Cervical back: Normal range of motion.     Right lower leg: No edema.     Left lower leg: No edema.  Skin:    General: Skin is warm and dry.     Capillary Refill: Capillary refill takes less than 2 seconds.  Neurological:     Mental Status: She is alert and oriented to person, place, and time.     GCS: GCS eye subscore is 4. GCS verbal subscore is 5. GCS motor subscore is 6.     Cranial Nerves: Cranial nerves 2-12 are intact. No facial asymmetry.     Sensory: Sensory deficit (paresthesia to left hand palmar aspect) present.     Motor: Motor function is intact. No weakness or tremor.     Coordination: Coordination is intact. Finger-Nose-Finger Test normal.     Gait: Gait is intact.  Psychiatric:        Mood and Affect: Mood normal.        Behavior: Behavior normal.    ED Results / Procedures / Treatments   Labs (all labs ordered are listed, but only abnormal results are displayed) Labs Reviewed  BASIC METABOLIC PANEL - Abnormal; Notable for the following components:      Result Value   Potassium 3.2 (*)    Glucose, Bld 119 (*)    All other components within normal limits  CBC WITH DIFFERENTIAL/PLATELET  MAGNESIUM  TROPONIN I (HIGH SENSITIVITY)    EKG EKG Interpretation  Date/Time:  Tuesday November 04 2020 10:02:58 EDT Ventricular Rate:  82 PR Interval:  136 QRS Duration: 80 QT Interval:  364 QTC Calculation: 425 R Axis:   35 Text Interpretation: Normal sinus rhythm Nonspecific T wave abnormality Confirmed by Cathren Laine ([id number]) on 11/05/2020 3:18:24  PM  Radiology CT Head Wo Contrast  Result Date: 11/04/2020 CLINICAL DATA:  Left hand numbness for 2 days. Dizziness starting last night. Patient scheduled for stent placement in the left carotid artery tomorrow. EXAM: CT HEAD WITHOUT CONTRAST TECHNIQUE: Contiguous axial images were obtained from the base of the skull through the vertex without intravenous contrast. COMPARISON:  CT head 03/18/2020 and earlier FINDINGS: Brain: No evidence of acute infarction, hemorrhage, hydrocephalus, extra-axial collection or mass lesion/mass effect. Vascular: No hyperdense vessel. Tiny linear hyperdensity in the left basal ganglia is unchanged compared to exams dating back to 12/22/2016, likely vascular calcification. Skull: Normal. Negative for fracture or focal lesion. Sinuses/Orbits: No acute finding. Other: None. IMPRESSION: No acute intracranial abnormality. Electronically Signed   By: Sherron Ales M.D.   On: 11/04/2020 11:23   DG Chest Portable 1 View  Result Date: 11/04/2020 CLINICAL DATA:  Lightheaded EXAM: PORTABLE CHEST 1 VIEW COMPARISON:  03/16/2020 FINDINGS: Borderline cardiomegaly. Lungs clear. No effusions or edema. No acute bony  abnormality. IMPRESSION: Borderline cardiomegaly.  No active disease. Electronically Signed   By: Charlett Nose M.D.   On: 11/04/2020 18:02    Procedures Procedures   Medications Ordered in ED Medications - No data to display  ED Course  I have reviewed the triage vital signs and the nursing notes.  Pertinent labs & imaging results that were available during my care of the patient were reviewed by me and considered in my medical decision making (see chart for details).    MDM Rules/Calculators/A&P                           CC: tingling, light headed   This patient complains of above; this involves an extensive number of treatment options and is a complaint that carries with it a high risk of complications and morbidity. Vital signs were reviewed. Serious  etiologies considered.  Record review:  Previous records obtained and reviewed   Work up as above, notable for:   Labs & imaging results that were available during my care of the patient were reviewed by me and considered in my medical decision making.   I ordered imaging studies which included CTH, CXR and I independently visualized and interpreted imaging which showed no acute process.  Repeat neurologic exam is stable, she has some mild tingling to her left hand, no weakness, pulses are intact bilateral 2+ radial. Extremities are warm, well perfused. Given negative CT imaging, negative cardiac workup in ED favor her hand paresthesias is likely not due to CVA. Could be ongoing symptom to her ipsilateral carotid artery  stenosis (75%). She is scheduled to have IR procedure tomorrow AM for revascularization.  She also has right carotid total occlusion.   At this time feel patient is safe for discharge. Repeat neuro exam is re-assurring, no focal deficits. She is ambulatory, tolerating PO. Advise she f/u with PCP regarding elevated blood pressure reading.      The patient improved significantly and was discharged in stable condition. Detailed discussions were had with the patient regarding current findings, and need for close f/u with PCP or on call doctor. The patient has been instructed to return immediately if the symptoms worsen in any way for re-evaluation. Patient verbalized understanding and is in agreement with current care plan. All questions answered prior to discharge.          This chart was dictated using voice recognition software.  Despite best efforts to proofread,  errors can occur which can change the documentation meaning.  Final Clinical Impression(s) / ED Diagnoses Final diagnoses:  Paresthesia  Light headed  Postural hypotension  Stenosis of left carotid artery    Rx / DC Orders ED Discharge Orders     None        Sloan Leiter, DO 11/05/20  1935

## 2020-11-05 ENCOUNTER — Other Ambulatory Visit (HOSPITAL_COMMUNITY): Payer: 59

## 2020-11-05 ENCOUNTER — Other Ambulatory Visit: Payer: Self-pay | Admitting: Radiology

## 2020-11-05 ENCOUNTER — Other Ambulatory Visit (HOSPITAL_COMMUNITY): Payer: Self-pay

## 2020-11-05 ENCOUNTER — Ambulatory Visit (HOSPITAL_COMMUNITY): Payer: 59 | Admitting: Anesthesiology

## 2020-11-05 ENCOUNTER — Inpatient Hospital Stay (HOSPITAL_COMMUNITY)
Admission: AD | Admit: 2020-11-05 | Discharge: 2020-11-06 | DRG: 036 | Disposition: A | Discharge status: Home / Self Care | Payer: 59 | Attending: Interventional Radiology | Admitting: Interventional Radiology

## 2020-11-05 ENCOUNTER — Other Ambulatory Visit: Payer: Self-pay

## 2020-11-05 ENCOUNTER — Ambulatory Visit (HOSPITAL_COMMUNITY)
Admission: RE | Admit: 2020-11-05 | Discharge: 2020-11-05 | Disposition: A | Discharge status: Home / Self Care | Payer: 59 | Source: Ambulatory Visit | Attending: Interventional Radiology | Admitting: Interventional Radiology

## 2020-11-05 ENCOUNTER — Encounter (HOSPITAL_COMMUNITY): Payer: Self-pay | Admitting: Interventional Radiology

## 2020-11-05 ENCOUNTER — Encounter (HOSPITAL_COMMUNITY)
Admission: AD | Disposition: A | Discharge status: Home / Self Care | Payer: Self-pay | Source: Ambulatory Visit | Attending: Interventional Radiology

## 2020-11-05 ENCOUNTER — Encounter (HOSPITAL_COMMUNITY): Payer: Self-pay | Admitting: Emergency Medicine

## 2020-11-05 ENCOUNTER — Inpatient Hospital Stay (HOSPITAL_COMMUNITY): Admission: RE | Admit: 2020-11-05 | Payer: 59 | Source: Ambulatory Visit

## 2020-11-05 DIAGNOSIS — E7800 Pure hypercholesterolemia, unspecified: Secondary | ICD-10-CM | POA: Diagnosis present

## 2020-11-05 DIAGNOSIS — Z79899 Other long term (current) drug therapy: Secondary | ICD-10-CM

## 2020-11-05 DIAGNOSIS — Z7901 Long term (current) use of anticoagulants: Secondary | ICD-10-CM | POA: Insufficient documentation

## 2020-11-05 DIAGNOSIS — E559 Vitamin D deficiency, unspecified: Secondary | ICD-10-CM | POA: Diagnosis present

## 2020-11-05 DIAGNOSIS — I2510 Atherosclerotic heart disease of native coronary artery without angina pectoris: Secondary | ICD-10-CM | POA: Insufficient documentation

## 2020-11-05 DIAGNOSIS — Z8249 Family history of ischemic heart disease and other diseases of the circulatory system: Secondary | ICD-10-CM | POA: Diagnosis not present

## 2020-11-05 DIAGNOSIS — G4733 Obstructive sleep apnea (adult) (pediatric): Secondary | ICD-10-CM | POA: Diagnosis present

## 2020-11-05 DIAGNOSIS — I951 Orthostatic hypotension: Secondary | ICD-10-CM | POA: Diagnosis present

## 2020-11-05 DIAGNOSIS — I10 Essential (primary) hypertension: Secondary | ICD-10-CM | POA: Insufficient documentation

## 2020-11-05 DIAGNOSIS — Z823 Family history of stroke: Secondary | ICD-10-CM

## 2020-11-05 DIAGNOSIS — I4891 Unspecified atrial fibrillation: Secondary | ICD-10-CM | POA: Diagnosis present

## 2020-11-05 DIAGNOSIS — Z8673 Personal history of transient ischemic attack (TIA), and cerebral infarction without residual deficits: Secondary | ICD-10-CM | POA: Insufficient documentation

## 2020-11-05 DIAGNOSIS — I6523 Occlusion and stenosis of bilateral carotid arteries: Secondary | ICD-10-CM | POA: Diagnosis present

## 2020-11-05 DIAGNOSIS — Z7982 Long term (current) use of aspirin: Secondary | ICD-10-CM | POA: Diagnosis not present

## 2020-11-05 DIAGNOSIS — I771 Stricture of artery: Secondary | ICD-10-CM

## 2020-11-05 DIAGNOSIS — I6522 Occlusion and stenosis of left carotid artery: Principal | ICD-10-CM | POA: Diagnosis present

## 2020-11-05 HISTORY — PX: IR ANGIO INTRA EXTRACRAN SEL COM CAROTID INNOMINATE UNI L MOD SED: IMG5358

## 2020-11-05 HISTORY — PX: IR INTRAVSC STENT CERV CAROTID W/EMB-PROT MOD SED INCL ANGIO: IMG2303

## 2020-11-05 HISTORY — PX: RADIOLOGY WITH ANESTHESIA: SHX6223

## 2020-11-05 HISTORY — DX: Cerebral infarction, unspecified: I63.9

## 2020-11-05 HISTORY — PX: IR CT HEAD LTD: IMG2386

## 2020-11-05 LAB — MRSA NEXT GEN BY PCR, NASAL: MRSA by PCR Next Gen: NOT DETECTED

## 2020-11-05 LAB — APTT: aPTT: 33 seconds (ref 24–36)

## 2020-11-05 LAB — PROTIME-INR
INR: 1.1 (ref 0.8–1.2)
Prothrombin Time: 14.1 seconds (ref 11.4–15.2)

## 2020-11-05 LAB — HEPARIN LEVEL (UNFRACTIONATED): Heparin Unfractionated: 0.6 IU/mL (ref 0.30–0.70)

## 2020-11-05 LAB — POCT ACTIVATED CLOTTING TIME
Activated Clotting Time: 184 seconds
Activated Clotting Time: 196 seconds

## 2020-11-05 IMAGING — XA IR INTRAVSC STENT CERV CAROTID W/ EMB-PROT
1 series · 10 of 24 positions shown · IV contrast (IODINE)
Comparison: Diagnostic catheter arteriogram [DATE].

CLINICAL DATA: History of intermittent left hand paresthesias.
Previous history of right internal carotid artery occlusion.
Discovery of prominent stenosis of the left internal carotid artery
proximally.

EXAM:
INTRAVSC STENT CERV CAROTID W/ EMB-PROT; IR ANGIO INTRA EXTRACRAN
SEL COM CAROTID INNOMINATE UNI LEFT MOD SED; IR CT HEAD LIMITED
TECHNIQUE: Informed written consent was obtained from the patient after a
thorough discussion of the procedural risks, benefits and
alternatives. All questions were addressed. Maximal Sterile Barrier
Technique was utilized including caps, mask, sterile gowns, sterile
gloves, sterile drape, hand hygiene and skin antiseptic. A timeout
was performed prior to the initiation of the procedure.

[Series 300: ir intra cran stent · 10 of 151 slices shown]
[im 7/151]
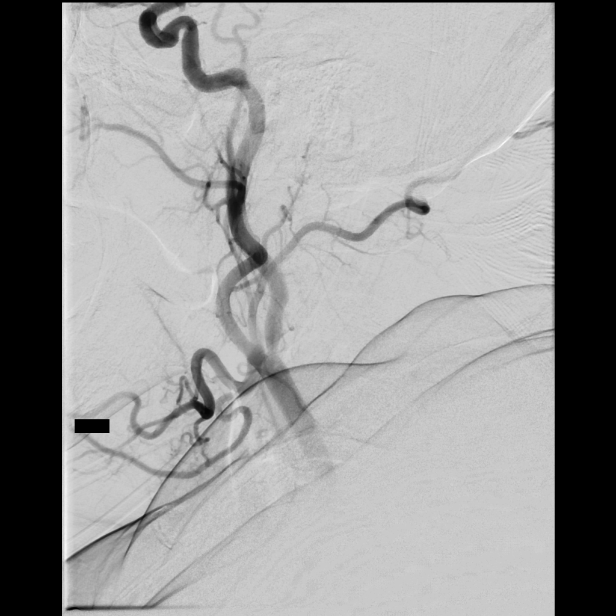
[im 20/151]
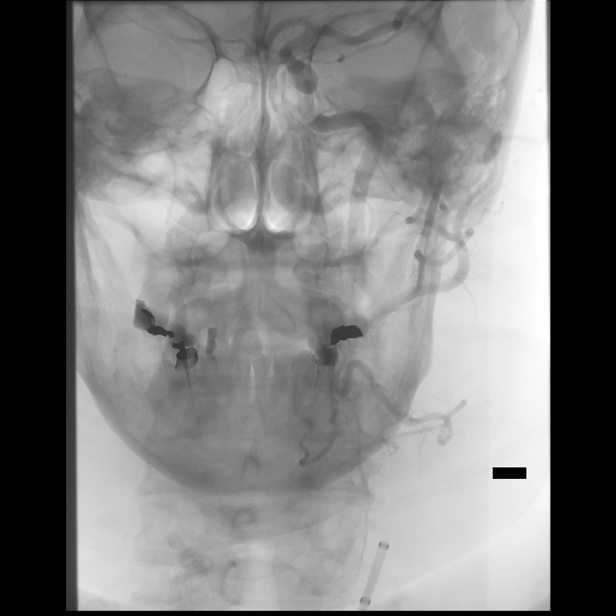
[im 40/151]
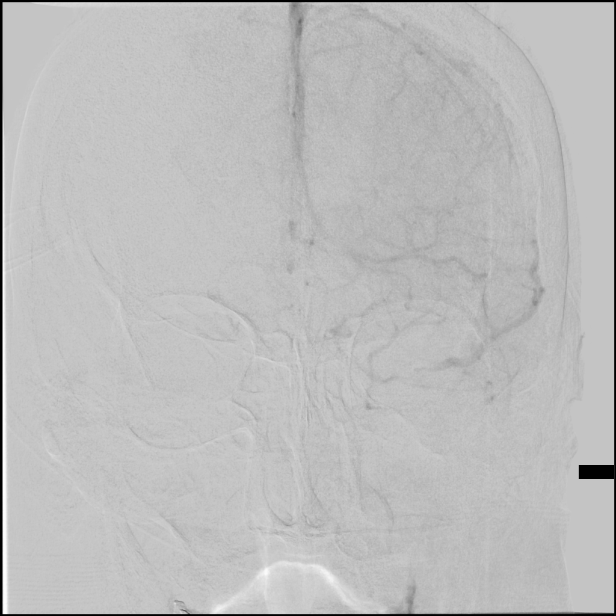
[im 53/151]
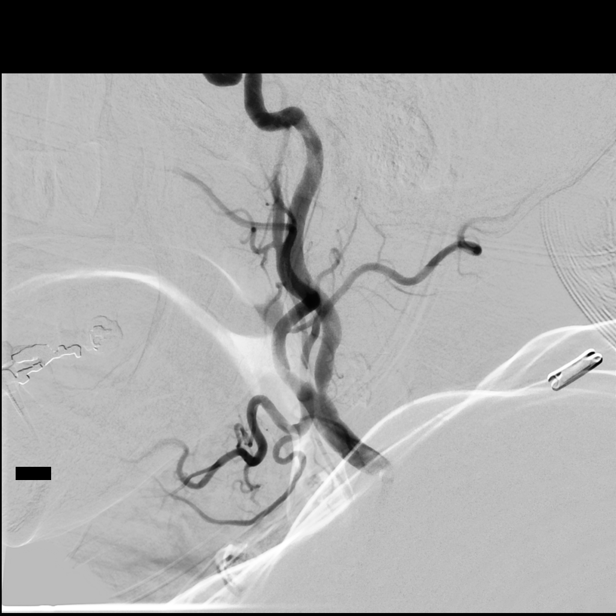
[im 66/151]
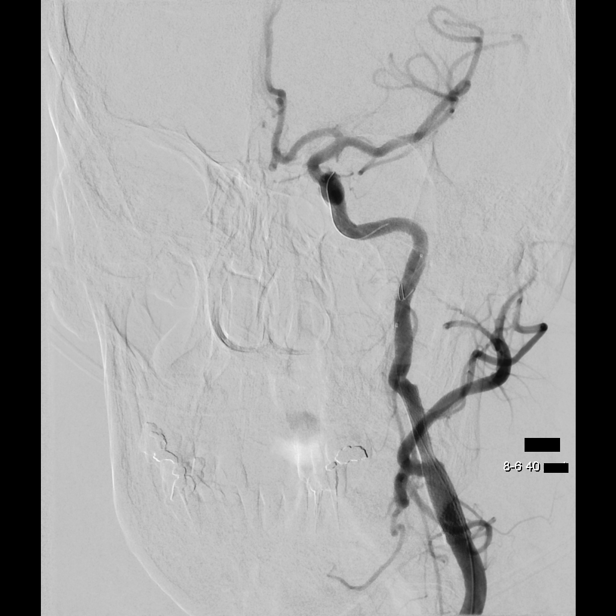
[im 85/151]
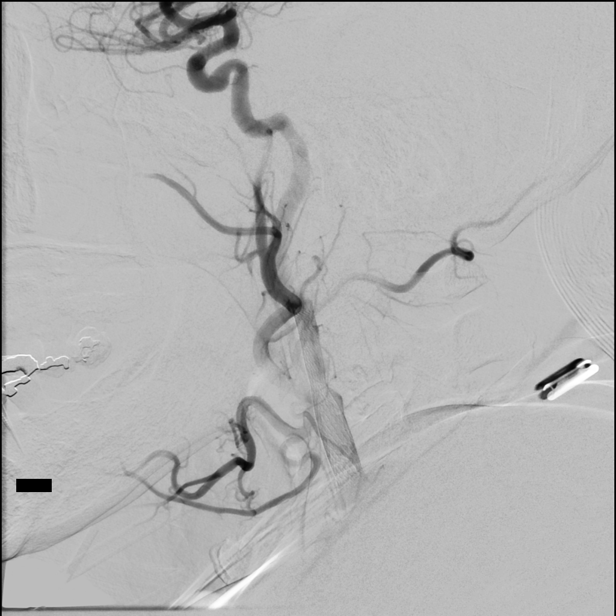
[im 98/151]
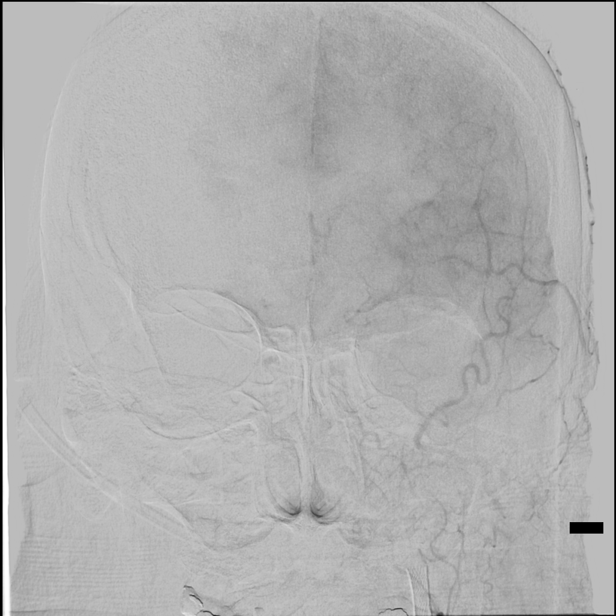
[im 118/151]
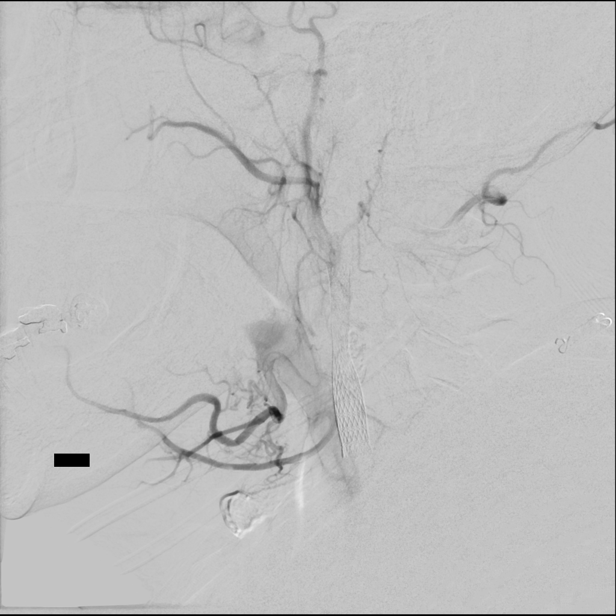
[im 131/151]
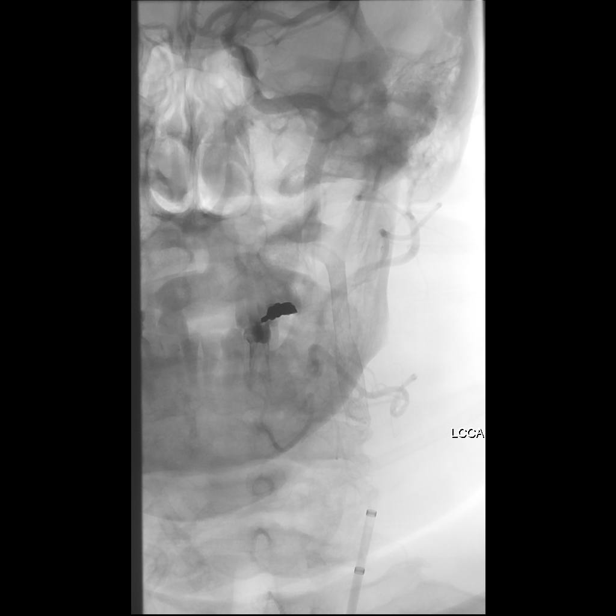
[im 144/151]
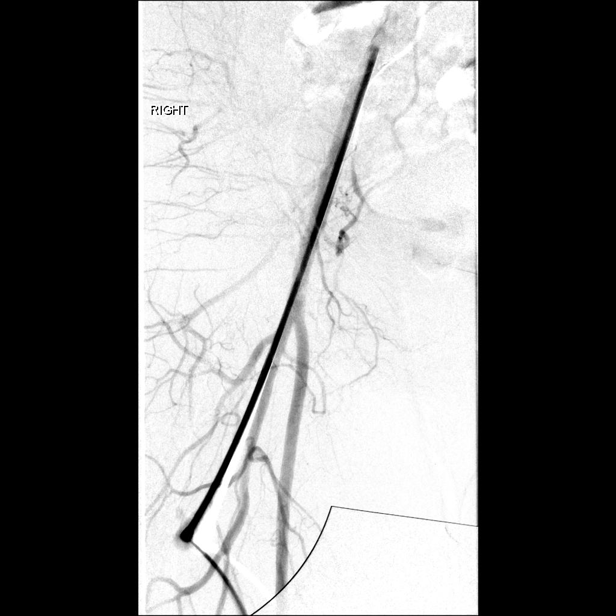

[10 of 24 positions shown; findings below may reference images not displayed]

MEDICATIONS:
Heparin 4,000 units IV; Ancef 2 g IV antibiotic was administered
within 1 hour of the procedure.

ANESTHESIA/SEDATION:
Mac anesthesia as per [REDACTED] at [REDACTED].

CONTRAST:  Omnipaque 300 approximately 65 mL.

FLUOROSCOPY TIME:  Fluoroscopy Time: 23 minutes 0 seconds (968 mGy).

COMPLICATIONS:
None immediate.
The right groin was prepped and draped in the usual sterile fashion.
Thereafter using modified Seldinger technique, transfemoral access
into the right common femoral artery was obtained without
difficulty. Over a 0.035 inch guidewire, an 8 French Pinnacle 25 cm
sheath was inserted. Through this, and also over 0.035 inch
guidewire, a 0.5 BIRUNI 2 support catheter inside of an 087
balloon guide catheter was advanced over a 0.035 inch glidewire to
the aortic arch region. The BIRUNI 2 support catheter was then
formed in the aortic arch region and advanced over a 035 inch glide
catheter to just proximal to the left common carotid artery
bifurcation followed by the 087 balloon guide catheter.

The support catheter, and the wire were removed. Good aspiration
obtained from the hub of the balloon guide catheter.

Arteriograms were then performed extra cranially and intracranially
via the left common carotid artery.
FINDINGS: The left common carotid arteriogram demonstrates the left external
carotid artery and its major branches to be widely patent.

The left internal carotid artery at the bulb continued to
demonstrate tapered prominent high-grade stenosis on the left
projection.

More distally the vessel is seen to opacify to the cranial skull
base. The petrous, cavernous and supraclinoid segments demonstrate
wide patency.

The left middle cerebral artery and the left anterior cerebral
artery demonstrate opacification into the capillary and venous
phases. Cross-filling via the anterior communicating artery of the
right anterior cerebral A2 segment was evident.

ENDOVASCULAR REVASCULARIZATION OF SEVERELY STENOTIC LEFT INTERNAL
CAROTID ARTERY PROXIMALLY WITH DISTAL PROTECTION AND STENT ASSISTED
ANGIOPLASTY.

A [DATE] French of [employer] distal protection device was then
prepped and purged with heparinized saline to clear any air bubbles.
The distal protection delivery microcatheter with the 014 inch
guidewire were then retrieved from the housing.

A J configuration was given to the 014 inch micro guidewire.

Thereafter using biplane roadmap technique and constant heparinized
saline infusion the combination was then advanced to the distal end
of the balloon guide catheter in the left common carotid artery.

Using a torque device, and the micro guidewire and filter delivery
microcatheter combination was advanced without difficulty to the
distal left ICA. Wire was advanced into the distal horizontal
petrous segment. Under constant fluoroscopic guidance, the filter
device was then deployed without any difficulty. With the wire
maintained distally, the delivery microcatheter of the filter device
was then retrieved and removed. A control arteriogram performed
through the left common carotid artery demonstrated no evidence of
spasms, dissections or of intraluminal filling defects.

Measurements were then performed of the left internal carotid artery
proximally and the left common carotid artery, and distally at the
landing zone in the left internal carotid artery. It was decided to
proceed with placement of a 8/6 mm x 40 mm Xact stent device.

This was then prepped and purged antegradely and retrogradely with
heparinized saline infusion.

Using the rapid exchange technique the stent delivery apparatus was
advanced without difficulty to the left internal carotid artery
proximally.

The proximal and distal landing zones were then identified. The
stent was then deployed in the usual manner without any difficulty.
The delivery catheter was removed. Control arteriogram performed
through the balloon guide catheter in the left common carotid artery
demonstrated satisfactory position distally and proximally with good
apposition.

There continued be a waist at the previous area of stenosis.

This prompted the advancement of a Viatrac 014 5 mm x 30 mm
angioplasty balloon catheter which had been prepped with heparinized
saline infusion retrogradely, and with 50% contrast and 50%
heparinized saline infusion antegradely.

Again using the rapid exchange technique, this was advanced and
positioned with the proximal and distal markers just adequate
distant at the site of the waist. Control inflation was then
performed using micro inflation syringe device via micro tubing to
just over a 8.2 atmospheres. The balloon was left inflated for
approximately 40 seconds. Thereafter, it was deflated and retrieved
and removed. No hemodynamic complications or bradycardia was
evident. A control arteriogram performed in the left common carotid
artery demonstrated excellent apposition with excellent caliber of
the stented segment of the left internal carotid artery proximally.
Intracranially no evidence of intraluminal filling defects or of
occlusion was seen. Filter capture microcatheter was then advanced
again using the rapid exchange technique to the proximal marker on
the filter device. The filter device was then captured retrieving
the micro guidewire until completely captured. The combination was
then retrieved under constant fluoroscopic guidance without
difficulty.

Control arteriograms were then performed at 15 and 35 minutes post
stent angioplasty. These continued to demonstrate excellent flow
without evidence of intra stent irregularity or filling defects
extra cranially or intracranially involving the left middle and the
left anterior cerebral artery territories. The balloon guide was
then retrieved and removed. An 8 French Angio-Seal closure device
was used for hemostasis following removal of the 8 French Pinnacle
sheath. Distal pulses remained Dopplerable in both feet unchanged.

An immediate post CT of the brain demonstrated no evidence of
intracranial hemorrhage or mass effect or midline shift.

Clinically, the patient complained of no headaches, nausea,
vomiting, visual, motor, or speech difficulties.

She was able to move all her 4 extremities equally.

Patient was then transferred to PACU and then neuro ICU. Her
low-dose IV heparin was continued until the following morning.

In the early evening, the patient developed atrial fibrillation
which was not hemodynamically significant. Patient remained
asymptomatic with good blood pressure.

The following morning, the patient IV heparin was stopped and
patient was switched to Brilinta 90 mg b.i.d., and Eliquis 5 mg
b.i.d. as per her preprocedural doses.

Prior to commencing the Eliquis, bloodwork was drawn for a platelet
aggregation study.

The patient remained asymptomatic without any headaches, nausea or
vomiting. The right groin appeared soft. No evidence of tenderness
or swelling. Distal pulses remained palpable in both feet.

The patient was then ambulated with assistance to the chair and then
independently.

Patient was able to tolerate clear liquids and solids without
difficulty.

Patient was then discharged home under the care of her family.

Patient advised to maintain adequate hydration and to continue with
her Brilinta and her Eliquis 5 mg b.i.d. Patient also advised to
refrain from stooping, bending or lifting weights above 10 pounds
for 2 weeks. Patient was instructed to gradually increase her level
of activity as tolerated.

She was advised not to drive for a couple of weeks as well. Patient
to connect with her cardiologist regarding her atrial fibrillation
during her time in the hospital.

Twelve lead EKG with also be obtained prior to discharge. Patient
expressed understanding and agreement with the above management
plan.
IMPRESSION: Status post revascularization of severely stenotic left internal
carotid artery proximally with stent assisted angioplasty and distal
protection.

PLAN:
Follow-up in clinic 2 weeks post discharge.

## 2020-11-05 SURGERY — IR WITH ANESTHESIA
Anesthesia: Monitor Anesthesia Care

## 2020-11-05 MED ORDER — IRBESARTAN 150 MG PO TABS
150.0000 mg | ORAL_TABLET | Freq: Every day | ORAL | Status: DC
  Administered 2020-11-05: 150 mg via ORAL
  Filled 2020-11-05: qty 1

## 2020-11-05 MED ORDER — HEPARIN (PORCINE) 25000 UT/250ML-% IV SOLN
650.0000 [IU]/h | INTRAVENOUS | Status: DC
  Filled 2020-11-05: qty 250

## 2020-11-05 MED ORDER — ACETAMINOPHEN 10 MG/ML IV SOLN
INTRAVENOUS | Status: DC | PRN
  Administered 2020-11-05: 1000 mg via INTRAVENOUS

## 2020-11-05 MED ORDER — OLMESARTAN-AMLODIPINE-HCTZ 20-5-12.5 MG PO TABS: 1.0000 | ORAL_TABLET | Freq: Every day | ORAL | Status: DC

## 2020-11-05 MED ORDER — TICAGRELOR 90 MG PO TABS: 90.0000 mg | ORAL_TABLET | Freq: Two times a day (BID) | ORAL | Status: DC

## 2020-11-05 MED ORDER — ACETAMINOPHEN 650 MG RE SUPP: 650.0000 mg | RECTAL | Status: DC | PRN

## 2020-11-05 MED ORDER — ONDANSETRON HCL 4 MG/2ML IJ SOLN
INTRAMUSCULAR | Status: DC | PRN
  Administered 2020-11-05: 4 mg via INTRAVENOUS

## 2020-11-05 MED ORDER — ORAL CARE MOUTH RINSE: 15.0000 mL | Freq: Once | OROMUCOSAL | Status: AC

## 2020-11-05 MED ORDER — NITROGLYCERIN 1 MG/10 ML FOR IR/CATH LAB
INTRA_ARTERIAL | Status: AC
  Filled 2020-11-05: qty 10

## 2020-11-05 MED ORDER — EPTIFIBATIDE 20 MG/10ML IV SOLN
INTRAVENOUS | Status: AC
  Filled 2020-11-05: qty 10

## 2020-11-05 MED ORDER — HEPARIN SODIUM (PORCINE) 1000 UNIT/ML IJ SOLN
INTRAMUSCULAR | Status: DC | PRN
  Administered 2020-11-05: 1000 [IU] via INTRAVENOUS
  Administered 2020-11-05: 3000 [IU] via INTRAVENOUS

## 2020-11-05 MED ORDER — HYDROCHLOROTHIAZIDE 12.5 MG PO TABS: 12.5000 mg | ORAL_TABLET | Freq: Every day | ORAL | Status: DC

## 2020-11-05 MED ORDER — CLOPIDOGREL BISULFATE 75 MG PO TABS
75.0000 mg | ORAL_TABLET | ORAL | Status: DC
  Filled 2020-11-05: qty 1

## 2020-11-05 MED ORDER — CLEVIDIPINE BUTYRATE 0.5 MG/ML IV EMUL
INTRAVENOUS | Status: AC
  Filled 2020-11-05: qty 50

## 2020-11-05 MED ORDER — HYDROCHLOROTHIAZIDE 12.5 MG PO TABS
12.5000 mg | ORAL_TABLET | Freq: Every day | ORAL | Status: DC
  Administered 2020-11-05: 12.5 mg via ORAL
  Filled 2020-11-05: qty 1

## 2020-11-05 MED ORDER — CLEVIDIPINE BUTYRATE 0.5 MG/ML IV EMUL: 0.0000 mg/h | INTRAVENOUS | Status: AC

## 2020-11-05 MED ORDER — DILTIAZEM HCL ER COATED BEADS 120 MG PO CP24
120.0000 mg | ORAL_CAPSULE | Freq: Every day | ORAL | Status: DC
  Administered 2020-11-06: 120 mg via ORAL
  Filled 2020-11-05 (×2): qty 1

## 2020-11-05 MED ORDER — ASPIRIN EC 325 MG PO TBEC
325.0000 mg | DELAYED_RELEASE_TABLET | ORAL | Status: DC
  Filled 2020-11-05: qty 1

## 2020-11-05 MED ORDER — FENTANYL CITRATE (PF) 100 MCG/2ML IJ SOLN
25.0000 ug | INTRAMUSCULAR | Status: DC | PRN
  Administered 2020-11-05 (×2): 50 ug via INTRAVENOUS

## 2020-11-05 MED ORDER — MIDAZOLAM HCL 5 MG/5ML IJ SOLN
INTRAMUSCULAR | Status: DC | PRN
Start: 2020-11-05 — End: 2020-11-05
  Administered 2020-11-05: 2 mg via INTRAVENOUS

## 2020-11-05 MED ORDER — SODIUM CHLORIDE 0.9 % IV SOLN: INTRAVENOUS | Status: DC

## 2020-11-05 MED ORDER — ACETAMINOPHEN 160 MG/5ML PO SOLN: 650.0000 mg | ORAL | Status: DC | PRN

## 2020-11-05 MED ORDER — CEFAZOLIN SODIUM-DEXTROSE 2-4 GM/100ML-% IV SOLN
2.0000 g | INTRAVENOUS | Status: AC
  Administered 2020-11-05: 2 g via INTRAVENOUS
  Filled 2020-11-05: qty 100

## 2020-11-05 MED ORDER — TICAGRELOR 90 MG PO TABS
90.0000 mg | ORAL_TABLET | Freq: Two times a day (BID) | ORAL | 0 refills | Status: AC
  Filled 2020-11-05: qty 60, 30d supply, fill #0

## 2020-11-05 MED ORDER — PHENYLEPHRINE HCL-NACL 20-0.9 MG/250ML-% IV SOLN
INTRAVENOUS | Status: DC | PRN
  Administered 2020-11-05: 20 ug/min via INTRAVENOUS

## 2020-11-05 MED ORDER — ONDANSETRON HCL 4 MG PO TABS
4.0000 mg | ORAL_TABLET | Freq: Three times a day (TID) | ORAL | Status: DC | PRN
  Administered 2020-11-05: 4 mg via ORAL
  Filled 2020-11-05: qty 1

## 2020-11-05 MED ORDER — AMLODIPINE BESYLATE 5 MG PO TABS: 5.0000 mg | ORAL_TABLET | Freq: Every day | ORAL | Status: DC

## 2020-11-05 MED ORDER — NIMODIPINE 30 MG PO CAPS
30.0000 mg | ORAL_CAPSULE | Freq: Once | ORAL | Status: AC
  Administered 2020-11-05: 30 mg via ORAL

## 2020-11-05 MED ORDER — NIMODIPINE 30 MG PO CAPS
0.0000 mg | ORAL_CAPSULE | ORAL | Status: DC
  Filled 2020-11-05: qty 2

## 2020-11-05 MED ORDER — VERAPAMIL HCL 2.5 MG/ML IV SOLN
INTRAVENOUS | Status: AC
  Filled 2020-11-05: qty 2

## 2020-11-05 MED ORDER — LACTATED RINGERS IV SOLN: INTRAVENOUS | Status: DC

## 2020-11-05 MED ORDER — OXYCODONE HCL 5 MG/5ML PO SOLN: 5.0000 mg | Freq: Once | ORAL | Status: DC | PRN

## 2020-11-05 MED ORDER — SODIUM CHLORIDE 0.9 % IV SOLN: INTRAVENOUS | Status: DC | PRN

## 2020-11-05 MED ORDER — OXYCODONE-ACETAMINOPHEN 5-325 MG PO TABS
1.0000 | ORAL_TABLET | Freq: Four times a day (QID) | ORAL | Status: DC | PRN
  Administered 2020-11-05: 2 via ORAL
  Filled 2020-11-05: qty 2

## 2020-11-05 MED ORDER — CHLORHEXIDINE GLUCONATE 0.12 % MT SOLN
15.0000 mL | Freq: Once | OROMUCOSAL | Status: AC
  Administered 2020-11-05: 15 mL via OROMUCOSAL
  Filled 2020-11-05: qty 15

## 2020-11-05 MED ORDER — OXYCODONE HCL 5 MG PO TABS: 5.0000 mg | ORAL_TABLET | Freq: Once | ORAL | Status: DC | PRN

## 2020-11-05 MED ORDER — IRBESARTAN 150 MG PO TABS: 150.0000 mg | ORAL_TABLET | Freq: Every day | ORAL | Status: DC

## 2020-11-05 MED ORDER — HEPARIN (PORCINE) 25000 UT/250ML-% IV SOLN
INTRAVENOUS | Status: AC
  Filled 2020-11-05: qty 250

## 2020-11-05 MED ORDER — EPTIFIBATIDE 20 MG/10ML IV SOLN
INTRAVENOUS | Status: AC | PRN
  Administered 2020-11-05 (×2): 2 mg via INTRAVENOUS

## 2020-11-05 MED ORDER — ACETAMINOPHEN 325 MG PO TABS
650.0000 mg | ORAL_TABLET | ORAL | Status: DC | PRN
  Administered 2020-11-06: 650 mg via ORAL
  Filled 2020-11-05: qty 2

## 2020-11-05 MED ORDER — ONDANSETRON HCL 4 MG/2ML IJ SOLN
4.0000 mg | Freq: Four times a day (QID) | INTRAMUSCULAR | Status: DC | PRN
Start: 2020-11-05 — End: 2020-11-05

## 2020-11-05 MED ORDER — HEPARIN SODIUM (PORCINE) 1000 UNIT/ML IJ SOLN
INTRAMUSCULAR | Status: AC
  Filled 2020-11-05: qty 1

## 2020-11-05 MED ORDER — HEPARIN (PORCINE) 25000 UT/250ML-% IV SOLN
500.0000 [IU]/h | INTRAVENOUS | Status: DC
  Administered 2020-11-05 (×2): 500 [IU]/h via INTRAVENOUS

## 2020-11-05 MED ORDER — ACETAMINOPHEN 500 MG PO TABS: 500.0000 mg | ORAL_TABLET | Freq: Four times a day (QID) | ORAL | Status: DC | PRN

## 2020-11-05 MED ORDER — LIDOCAINE HCL 1 % IJ SOLN
INTRAMUSCULAR | Status: AC
  Filled 2020-11-05: qty 20

## 2020-11-05 MED ORDER — TICAGRELOR 90 MG PO TABS
ORAL_TABLET | ORAL | Status: AC
  Filled 2020-11-05: qty 2

## 2020-11-05 MED ORDER — CHLORHEXIDINE GLUCONATE CLOTH 2 % EX PADS
6.0000 | MEDICATED_PAD | Freq: Every day | CUTANEOUS | Status: DC
  Administered 2020-11-05 – 2020-11-06 (×2): 6 via TOPICAL

## 2020-11-05 MED ORDER — FENTANYL CITRATE (PF) 250 MCG/5ML IJ SOLN
INTRAMUSCULAR | Status: DC | PRN
  Administered 2020-11-05 (×4): 50 ug via INTRAVENOUS

## 2020-11-05 MED ORDER — TICAGRELOR 90 MG PO TABS
180.0000 mg | ORAL_TABLET | Freq: Once | ORAL | Status: AC
  Administered 2020-11-05: 180 mg via ORAL

## 2020-11-05 MED ORDER — SODIUM CHLORIDE 0.9 % IV BOLUS
250.0000 mL | Freq: Once | INTRAVENOUS | Status: AC
  Administered 2020-11-05: 250 mL via INTRAVENOUS

## 2020-11-05 MED ORDER — GLYCOPYRROLATE PF 0.2 MG/ML IJ SOSY
PREFILLED_SYRINGE | INTRAMUSCULAR | Status: DC | PRN
  Administered 2020-11-05 (×2): .1 mg via INTRAVENOUS

## 2020-11-05 MED ORDER — TICAGRELOR 90 MG PO TABS
90.0000 mg | ORAL_TABLET | Freq: Two times a day (BID) | ORAL | Status: DC
  Administered 2020-11-05 – 2020-11-06 (×2): 90 mg via ORAL
  Filled 2020-11-05 (×2): qty 1

## 2020-11-05 MED ORDER — FENTANYL CITRATE (PF) 100 MCG/2ML IJ SOLN
INTRAMUSCULAR | Status: AC
  Filled 2020-11-05: qty 2

## 2020-11-05 NOTE — Progress Notes (Signed)
Per Clifton Custard at [place], P2Y12 is 164.  Awaiting fax.

## 2020-11-05 NOTE — Progress Notes (Signed)
ANTICOAGULATION CONSULT NOTE - Initial Consult  Pharmacy Consult for Heparin Indication: Post-neuro IR  Allergies  Allergen Reactions   Other Itching and Other (See Comments)    Patient experienced intense itching in her arm that was accessed for a past angiogram    Patient Measurements: Height: 5\' 1"  (154.9 cm) Weight: 88.5 kg (195 lb) IBW/kg (Calculated) : 47.8 Heparin Dosing Weight: 68  Vital Signs: Temp: 97.7 F (36.5 C) (10/26 1100) BP: 110/52 (10/26 1245) Pulse Rate: 53 (10/26 1245)  Labs: Recent Labs    11/04/20 1003 11/04/20 1731 11/05/20 0622  HGB 12.0  --   --   HCT 37.6  --   --   PLT 282  --   --   LABPROT  --   --  14.1  INR  --   --  1.1  CREATININE 1.00  --   --   TROPONINIHS  --  5  --     Estimated Creatinine Clearance: 60.5 mL/min (by C-G formula based on SCr of 1 mg/dL).   Medical History: Past Medical History:  Diagnosis Date   Allergy    Arthritis    back    Atrial fibrillation (HCC)    Atypical chest pain 06/30/2016   Back pain    DUE TO INJURY AT WORK ON05/2013   Bruises easily    Chronic lower back pain    Coronary artery disease    Depression    was on meds 2 yrs ago    Headache(784.0)    rarely   History of bronchitis    last time about 69yrs ago    Hypertension    takes Benicar daily   Hypothyroidism 06/30/2016   patient denies; states "i have never been told i had thyroid problems" and does not take any medications for thyroid   OSA (obstructive sleep apnea) 04/28/2020   Preoperative evaluation of a medical condition to rule out surgical contraindications (TAR required) 09/12/2020   Pure hypercholesterolemia 05/31/2019   Stroke (HCC)    no residual   Vitamin D deficiency    takes Vitamin D 2 times/wk   Weakness    and numbness right foot    Medications:  Infusions:   sodium chloride     clevidipine     clevidipine     heparin     heparin 500 Units/hr (11/05/20 1108)   lactated ringers      Assessment: 60  YOF on Apixaban PTA for hx Afib who presented on 10/26 for planned L-ICA arteriogram and stenting. Pharmacy consulted to dose Heparin post-op.  Will aim for low goal of 0.1-0.25 s/p neuro-IR procedure with plans to address transition to full [facility] on 10/27 AM - either back to Apixaban or to increase Heparin to goal range.   Goal of Therapy:  Heparin level 0.1-0.25 units/ml Monitor platelets by anticoagulation protocol: Yes   Plan:  - Continue Heparin at 500 units/hr - Will continue to monitor for any signs/symptoms of bleeding and will follow up with heparin level and aPTT in 6 hours  Thank you for allowing pharmacy to be a part of this patient's care.  [id number], PharmD, BCPS Clinical Pharmacist Clinical phone for 11/05/2020: [phone] 11/05/2020 1:22 PM   **Pharmacist phone directory can now be found on [email or web address] ([email or web address]).  Listed under [facility].

## 2020-11-05 NOTE — Anesthesia Procedure Notes (Signed)
Arterial Line Insertion Start/End10/26/2022 8:25 AM, 11/05/2020 8:32 AM Performed by: CRNA  Patient location: Pre-op. Preanesthetic checklist: patient identified, IV checked, site marked, risks and benefits discussed, surgical consent, monitors and equipment checked, pre-op evaluation, timeout performed and anesthesia consent Lidocaine 1% used for infiltration Left, radial was placed Catheter size: 20 G Hand hygiene performed , maximum sterile barriers used  and Seldinger technique used Allen's test indicative of satisfactory collateral circulation Attempts: 2 Procedure performed without using ultrasound guided technique. Following insertion, dressing applied and Biopatch. Post procedure assessment: normal  Post procedure complications: local hematoma. Patient tolerated the procedure well with no immediate complications.

## 2020-11-05 NOTE — Progress Notes (Signed)
Dr. Corliss Skains made aware of patient's systolic blood pressure being below parameters of 120-140, both by cuff and by arterial line. MD made aware arterial line pressure is lower than cuff pressure and the two aren't always correlating, and that arterial line waveform is appropriate. Verbal orders for a one time bolus of 250 mL normal saline.

## 2020-11-05 NOTE — H&P (Deleted)
  The note originally documented on this encounter has been moved the the encounter in which it belongs.  

## 2020-11-05 NOTE — Anesthesia Preprocedure Evaluation (Signed)
Anesthesia Evaluation  Patient identified by MRN, date of birth, ID band Patient awake    Reviewed: Allergy & Precautions, H&P , NPO status , Patient's Chart, lab work & pertinent test results  Airway Mallampati: II   Neck ROM: full    Dental   Pulmonary sleep apnea ,    breath sounds clear to auscultation       Cardiovascular hypertension, + CAD and + Cardiac Stents   Rhythm:regular Rate:Normal     Neuro/Psych  Headaches, PSYCHIATRIC DISORDERS Depression CVA    GI/Hepatic   Endo/Other  Hypothyroidism   Renal/GU      Musculoskeletal  (+) Arthritis ,   Abdominal   Peds  Hematology   Anesthesia Other Findings   Reproductive/Obstetrics                             Anesthesia Physical Anesthesia Plan  ASA: 3  Anesthesia Plan: MAC   Post-op Pain Management:    Induction: Intravenous  PONV Risk Score and Plan: 2 and Propofol infusion, Treatment may vary due to age or medical condition and Midazolam  Airway Management Planned: Simple Face Mask  Additional Equipment: Arterial line  Intra-op Plan:   Post-operative Plan:   Informed Consent: I have reviewed the patients History and Physical, chart, labs and discussed the procedure including the risks, benefits and alternatives for the proposed anesthesia with the patient or authorized representative who has indicated his/her understanding and acceptance.     Dental advisory given  Plan Discussed with: CRNA, Anesthesiologist and Surgeon  Anesthesia Plan Comments:         Anesthesia Quick Evaluation

## 2020-11-05 NOTE — Progress Notes (Signed)
ANTICOAGULATION CONSULT NOTE  Pharmacy Consult for Heparin Indication: Post-neuro IR  Allergies  Allergen Reactions   Other Itching and Other (See Comments)    Patient experienced intense itching in her arm that was accessed for a past angiogram    Patient Measurements: Height: 5\' 1"  (154.9 cm) Weight: 88.5 kg (195 lb) IBW/kg (Calculated) : 47.8 Heparin Dosing Weight: 68 kg  Vital Signs: Temp: 98.2 F (36.8 C) (10/26 2000) Temp Source: Oral (10/26 2000) BP: 108/65 (10/26 2030) Pulse Rate: 68 (10/26 2000)  Labs: Recent Labs    11/04/20 1003 11/04/20 1731 11/05/20 0622 11/05/20 1805  HGB 12.0  --   --   --   HCT 37.6  --   --   --   PLT 282  --   --   --   APTT  --   --   --  33  LABPROT  --   --  14.1  --   INR  --   --  1.1  --   HEPARINUNFRC  --   --   --  0.60  CREATININE 1.00  --   --   --   TROPONINIHS  --  5  --   --      Estimated Creatinine Clearance: 60.5 mL/min (by C-G formula based on SCr of 1 mg/dL).   Medical History: Past Medical History:  Diagnosis Date   Allergy    Arthritis    back    Atrial fibrillation (HCC)    Atypical chest pain 06/30/2016   Back pain    DUE TO INJURY AT WORK ON05/2013   Bruises easily    Chronic lower back pain    Coronary artery disease    Depression    was on meds 2 yrs ago    Headache(784.0)    rarely   History of bronchitis    last time about 67yrs ago    Hypertension    takes Benicar daily   Hypothyroidism 06/30/2016   patient denies; states "i have never been told i had thyroid problems" and does not take any medications for thyroid   OSA (obstructive sleep apnea) 04/28/2020   Preoperative evaluation of a medical condition to rule out surgical contraindications (TAR required) 09/12/2020   Pure hypercholesterolemia 05/31/2019   Stroke (HCC)    no residual   Vitamin D deficiency    takes Vitamin D 2 times/wk   Weakness    and numbness right foot    Medications:  Infusions:   sodium chloride 75  mL/hr at 11/05/20 2000   clevidipine     clevidipine     heparin     heparin 500 Units/hr (11/05/20 2000)    Assessment: 60 YOF on Apixaban PTA for hx Afib (last dose 10/24) who presented on 10/26 for planned L-ICA arteriogram and stenting. Pharmacy consulted to dose Heparin post-op.  Will aim for low heparin level goal of 0.1-0.25 (aPTT 50-60) s/p neuro-IR procedure with plans to address transition to full AC on 10/27 AM - either back to Apixaban or to increase Heparin to goal range.  PM update - aPTT low at 33 this evening, heparin level elevated for lower goal at 0.6 (due to influence of apixaban). Will monitor using aPTT for now until levels correlating. CBC wnl. No active bleed or issues with the infusion reported per discussion with RN.  Goal of Therapy:  Heparin level 0.1-0.25 units/ml aPTT 50-60 seconds Monitor platelets by anticoagulation protocol: Yes   Plan:  Increase heparin infusion to 650 units/hr Check 6hr aPTT Monitor daily heparin level/CBC until correlating, s/sx bleeding F/u either transition back to apixaban or increase to full heparin level goal 10/27 AM   Leia Alf, PharmD, BCPS Please check AMION for all [facility] contact numbers Clinical Pharmacist 11/05/2020 8:43 PM

## 2020-11-05 NOTE — Progress Notes (Signed)
Supervising Physician: Julieanne Cotton  Patient Status:  [facility] - In-pt  Chief Complaint:  L ICA stenosis  Subjective:  Lt common carotid arteriogram followed by stent assisted angioplasty of Lt ICA prox stenosis with distal protection Doing well post procedure In Neuro ICU Neck brace on Resting and Is comfortable   Allergies: Other  Medications: Prior to Admission medications   Medication Sig Start Date End Date Taking? Authorizing Provider  acetaminophen (TYLENOL) 500 MG tablet Take 500-1,000 mg by mouth every 6 (six) hours as needed (pain or headaches).   Yes [provider]  apixaban (ELIQUIS) 5 MG TABS tablet Take 1 tablet (5 mg total) by mouth 2 (two) times daily. 04/17/20  Yes Ronney Asters, NP  aspirin EC 81 MG tablet Take 81 mg by mouth daily. Swallow whole.- Patient said that it is not EC.   Yes [provider]  atorvastatin (LIPITOR) 80 MG tablet Take 1 tablet (80 mg total) by mouth daily at 6 PM. 10/13/20  Yes Dorothyann Peng, MD  cholecalciferol (VITAMIN D3) 25 MCG (1000 UNIT) tablet Take 1,000 Units by mouth daily.    Yes [provider]  diltiazem (CARDIZEM CD) 120 MG 24 hr capsule TAKE 1 CAPSULE BY MOUTH EVERY DAY 09/22/20  Yes Dorothyann Peng, MD  fluticasone (FLONASE) 50 MCG/ACT nasal spray PLACE 2 SPRAYS IN BOTH NOSTRILS DAILY AS NEEDED FOR ALLERGIES OR RHINITIS 10/13/20  Yes Dorothyann Peng, MD  levocetirizine (XYZAL) 5 MG tablet TAKE 1 TABLET BY MOUTH EVERY DAY 05/15/20  Yes Ghumman, Ramandeep, NP  docusate sodium (COLACE) 100 MG capsule Take 1 capsule (100 mg total) by mouth daily as needed. 10/20/20 10/20/21  Cristie Hem, PA-C  enoxaparin (LOVENOX) 150 MG/ML injection Inject 1 mL (150 mg total) into the skin daily. Patient taking differently: Inject 150 mg into the skin daily. For hip replacement 10/16/20   Chilton Si, MD  ezetimibe (ZETIA) 10 MG tablet TAKE 1 TABLET BY MOUTH EVERY DAY 11/02/19   Chilton Si, MD   furosemide (LASIX) 40 MG tablet DAILY FOR 3 DAYS AND THEN AS DIRECTED 10/27/20   Chilton Si, MD  hydrOXYzine (ATARAX/VISTARIL) 25 MG tablet Take 1 tablet (25 mg total) by mouth 3 (three) times daily as needed. 08/21/19   Arnette Felts, FNP  Magnesium 250 MG TABS TAKE 1 TABLET BY MOUTH DAILY WITH EVENING MEALS 10/13/20   Dorothyann Peng, MD  methocarbamol (ROBAXIN) 500 MG tablet Take 1 tablet (500 mg total) by mouth 2 (two) times daily as needed. To be taken after surgery 10/20/20   Cristie Hem, PA-C  mometasone (ELOCON) 0.1 % cream APPLY TO AFFECTED AREA EVERY DAY 10/29/20   Arnette Felts, FNP  Olmesartan-amLODIPine-HCTZ 20-5-12.5 MG TABS TAKE 1 TABLET BY MOUTH EVERY DAY 10/03/20   Dorothyann Peng, MD  olmesartan-hydrochlorothiazide (BENICAR HCT) 40-25 MG tablet TAKE 1 TABLET BY MOUTH EVERY DAY 10/29/20   Dorothyann Peng, MD  ondansetron (ZOFRAN) 4 MG tablet Take 1 tablet (4 mg total) by mouth every 8 (eight) hours as needed for nausea or vomiting. 10/20/20   Cristie Hem, PA-C  oxyCODONE-acetaminophen (PERCOCET) 5-325 MG tablet Take 1-2 tablets by mouth every 6 (six) hours as needed. To be taken after surgery Patient taking differently: Take 1-2 tablets by mouth every 6 (six) hours as needed. To be taken after surgery- hip 10/20/20   Cristie Hem, PA-C  ticagrelor (BRILINTA) 90 MG TABS tablet Take 1 tablet (90 mg total) by mouth 2 (two) times  daily. 11/05/20 02/03/21  Boisseau, Mayme Genta, PA  topiramate (TOPAMAX) 100 MG tablet Take 1 tablet (100 mg total) by mouth at bedtime. 09/09/20   Levert Feinstein, MD     Vital Signs: BP (!) 109/50   Pulse (!) 57   Temp 97.7 F (36.5 C)   Resp 13   Ht 5\' 1"  (1.549 m)   Wt 195 lb (88.5 kg)   LMP 06/03/2011   SpO2 98%   BMI 36.84 kg/m   Physical Exam Vitals reviewed.  HENT:     Mouth/Throat:     Mouth: Mucous membranes are moist.     Pharynx: Oropharynx is clear.  Skin:    General: Skin is warm.     Comments: Site is c/d/I NT No  bleeding No hematoma  Neurological:     Mental Status: She is alert and oriented to person, place, and time.  Psychiatric:        Behavior: Behavior normal.    Imaging: CT Head Wo Contrast  Result Date: 11/04/2020 CLINICAL DATA:  Left hand numbness for 2 days. Dizziness starting last night. Patient scheduled for stent placement in the left carotid artery tomorrow. EXAM: CT HEAD WITHOUT CONTRAST TECHNIQUE: Contiguous axial images were obtained from the base of the skull through the vertex without intravenous contrast. COMPARISON:  CT head 03/18/2020 and earlier FINDINGS: Brain: No evidence of acute infarction, hemorrhage, hydrocephalus, extra-axial collection or mass lesion/mass effect. Vascular: No hyperdense vessel. Tiny linear hyperdensity in the left basal ganglia is unchanged compared to exams dating back to 12/22/2016, likely vascular calcification. Skull: Normal. Negative for fracture or focal lesion. Sinuses/Orbits: No acute finding. Other: None. IMPRESSION: No acute intracranial abnormality. Electronically Signed   By: 14/12/2016 M.D.   On: 11/04/2020 11:23   DG Chest Portable 1 View  Result Date: 11/04/2020 CLINICAL DATA:  Lightheaded EXAM: PORTABLE CHEST 1 VIEW COMPARISON:  03/16/2020 FINDINGS: Borderline cardiomegaly. Lungs clear. No effusions or edema. No acute bony abnormality. IMPRESSION: Borderline cardiomegaly.  No active disease. Electronically Signed   By: 05/16/2020 M.D.   On: 11/04/2020 18:02    Labs:  CBC: Recent Labs    03/18/20 0205 10/07/20 0650 10/17/20 0905 11/04/20 1003  WBC 8.5 7.9 6.6 6.6  HGB 11.0* 12.0 12.3 12.0  HCT 35.2* 38.1 38.2 37.6  PLT 247 319 310 282    COAGS: Recent Labs    10/07/20 0650 10/17/20 0905 11/05/20 0622  INR 1.1 1.2 1.1  APTT  --  29  --     BMP: Recent Labs    03/18/20 0205 06/26/20 1232 10/07/20 0650 10/17/20 0905 11/04/20 1003  NA 139 141 137 138 137  K 3.7 3.9 3.3* 3.6 3.2*  CL 109 104 105 106 104  CO2  22 22 24 25 25   GLUCOSE 112* 101* 113* 108* 119*  BUN 14 11 18 13 11   CALCIUM 9.2 9.8 9.5 9.7 9.4  CREATININE 0.87 0.89 1.03* 0.96 1.00  GFRNONAA >60  --  >60 >60 >60    LIVER FUNCTION TESTS: Recent Labs    03/11/20 1655 10/17/20 0905  BILITOT 0.6 0.8  AST 19 20  ALT 21 22  ALKPHOS 87 86  PROT 7.6 7.9  ALBUMIN 4.7 4.1    Assessment and Plan:  L ICA angioplasty/stent in IR Doing well Will see in am Plan for DC in am If stable   Electronically Signed: , PA-C 11/05/2020, 2:56 PM   I spent a total of 15 Minutes  at the the patient's bedside AND on the patient's hospital floor or unit, greater than 50% of which was counseling/coordinating care for L ICA revascularization

## 2020-11-05 NOTE — Progress Notes (Signed)
Orthopedic Tech Progress Note Patient Details:  [name] 11/11/1960 [id number]  Ortho Devices Type of Ortho Device: Soft collar Ortho Device/Splint Location: NECK Ortho Device/Splint Interventions: Ordered   Post Interventions Patient Tolerated: Well Instructions Provided: Care of device  Donald Pore 11/05/2020, 3:18 PM

## 2020-11-05 NOTE — H&P (Signed)
Chief Complaint: Patient was seen in consultation today for Cerebral arteriogram with Left internal carotid artery revascularization at the request of Dr Jerel Shepherd   Supervising Physician: Julieanne Cotton  Patient Status: [facility] - Out-pt  History of Present Illness: [name] is a 60 y.o. female   Hx CAD; HTN Afib (Eliquis)-- LD days ago CVA; [facility] 07/2019  Has been followed with Dr Jerel Shepherd and Dr Brayton El Known Rt ICA occlusion L ICA stenosis  Doppler 09/23/20: Summary: Right Carotid: Evidence consistent with a total occlusion of the right ICA. Left Carotid: Velocities in the left ICA are consistent with a 60-79% stenosis. Vertebrals: Bilateral vertebral arteries demonstrate antegrade flow.  Cerebral arteriogram 9/27 in IR IMPRESSION: Angiographically occluded right internal carotid artery at the bulb with partial reconstitution in the caval cavernous segment from the external carotid artery branches via the ipsilateral ophthalmic artery. Approximately 75% tapered stenosis of the left internal carotid artery at the bulb. The right middle cerebral artery and the right A1 segment opacify from the left internal carotid artery via the anterior communicating artery. PLAN: Findings reviewed with the patient. Given the worsening left internal carotid artery proximally in the face of an occluded right internal carotid artery, the option of endovascular revascularization with stent assisted angioplasty in order to prevent future catastrophic neurologic events were reviewed. The other option was continued medical surveillance on anticoagulation/anti-platelet. The patient expressed her desire to proceed with revascularization endovascularly   Pt was started on Plavix 75 mg daily and ASA 81 mg daily P2y12 on 10/24:  164  Pt was seen in ED yesterday Dizziness and left arm/hand numbness for 2 days All work up negative DC to home--- for this procedure today   Past Medical  History:  Diagnosis Date   Allergy    Arthritis    back    Atrial fibrillation (HCC)    Atypical chest pain 06/30/2016   Back pain    DUE TO INJURY AT WORK ON05/2013   Bruises easily    Chronic lower back pain    Coronary artery disease    Depression    was on meds 2 yrs ago    Headache(784.0)    rarely   History of bronchitis    last time about 38yrs ago    Hypertension    takes Benicar daily   Hypothyroidism 06/30/2016   patient denies; states "i have never been told i had thyroid problems" and does not take any medications for thyroid   OSA (obstructive sleep apnea) 04/28/2020   Preoperative evaluation of a medical condition to rule out surgical contraindications (TAR required) 09/12/2020   Pure hypercholesterolemia 05/31/2019   Stroke (HCC)    no residual   Vitamin D deficiency    takes Vitamin D 2 times/wk   Weakness    and numbness right foot    Past Surgical History:  Procedure Laterality Date   BACK SURGERY     CARPAL TUNNEL RELEASE Right    CESAREAN SECTION  1988   COLONOSCOPY     EXPLORATORY LAPAROTOMY  1991   "took out fatty tumor" (11/09/2012)   IR ANGIO INTRA EXTRACRAN SEL COM CAROTID INNOMINATE BILAT MOD SED  08/10/2019   IR ANGIO INTRA EXTRACRAN SEL COM CAROTID INNOMINATE BILAT MOD SED  10/07/2020   IR ANGIO VERTEBRAL SEL SUBCLAVIAN INNOMINATE BILAT MOD SED  10/07/2020   IR ANGIO VERTEBRAL SEL VERTEBRAL BILAT MOD SED  08/10/2019   IR US GUIDE VASC ACCESS RIGHT  08/10/2019   IR US GUIDE VASC ACCESS RIGHT  10/07/2020   LEFT HEART CATH AND CORONARY ANGIOGRAPHY N/A 08/06/2016   Procedure: Left Heart Cath and Coronary Angiography;  Surgeon: Lyn Records, MD;  Location: [facility] INVASIVE CV LAB;  Service: Cardiovascular;  Laterality: N/A;   LEFT HEART CATH AND CORONARY ANGIOGRAPHY N/A 07/12/2018   Procedure: LEFT HEART CATH AND CORONARY ANGIOGRAPHY;  Surgeon: Marykay Lex, MD;  Location: [facility] INVASIVE CV LAB;  Service: Cardiovascular;  Laterality: N/A;   LUMBAR  LAMINECTOMY/DECOMPRESSION MICRODISCECTOMY  11/09/2012   "L3-4" (11/09/2012)   LUMBAR LAMINECTOMY/DECOMPRESSION MICRODISCECTOMY N/A 11/09/2012   Procedure: L3-L4 DECOMPRESSION AND MICRODISCECTOMY   (1 LEVEL);  Surgeon: Venita Lick, MD;  Location: [facility] OR;  Service: Orthopedics;  Laterality: N/A;    Allergies: Other  Medications: Prior to Admission medications   Medication Sig Start Date End Date Taking? Authorizing Provider  acetaminophen (TYLENOL) 500 MG tablet Take 500-1,000 mg by mouth every 6 (six) hours as needed (pain or headaches).   Yes [provider]  apixaban (ELIQUIS) 5 MG TABS tablet Take 1 tablet (5 mg total) by mouth 2 (two) times daily. 04/17/20  Yes Ronney Asters, NP  aspirin EC 81 MG tablet Take 81 mg by mouth daily. Swallow whole.- Patient said that it is not EC.   Yes [provider]  atorvastatin (LIPITOR) 80 MG tablet Take 1 tablet (80 mg total) by mouth daily at 6 PM. 10/13/20  Yes Dorothyann Peng, MD  cholecalciferol (VITAMIN D3) 25 MCG (1000 UNIT) tablet Take 1,000 Units by mouth daily.    Yes [provider]  diltiazem (CARDIZEM CD) 120 MG 24 hr capsule TAKE 1 CAPSULE BY MOUTH EVERY DAY 09/22/20  Yes Dorothyann Peng, MD  fluticasone (FLONASE) 50 MCG/ACT nasal spray PLACE 2 SPRAYS IN BOTH NOSTRILS DAILY AS NEEDED FOR ALLERGIES OR RHINITIS 10/13/20  Yes Dorothyann Peng, MD  levocetirizine (XYZAL) 5 MG tablet TAKE 1 TABLET BY MOUTH EVERY DAY 05/15/20  Yes Ghumman, Ramandeep, NP  docusate sodium (COLACE) 100 MG capsule Take 1 capsule (100 mg total) by mouth daily as needed. 10/20/20 10/20/21  Cristie Hem, PA-C  enoxaparin (LOVENOX) 150 MG/ML injection Inject 1 mL (150 mg total) into the skin daily. Patient taking differently: Inject 150 mg into the skin daily. For hip replacement 10/16/20   Chilton Si, MD  ezetimibe (ZETIA) 10 MG tablet TAKE 1 TABLET BY MOUTH EVERY DAY 11/02/19   Chilton Si, MD  furosemide (LASIX) 40 MG tablet DAILY  FOR 3 DAYS AND THEN AS DIRECTED 10/27/20   Chilton Si, MD  hydrOXYzine (ATARAX/VISTARIL) 25 MG tablet Take 1 tablet (25 mg total) by mouth 3 (three) times daily as needed. 08/21/19   Arnette Felts, FNP  Magnesium 250 MG TABS TAKE 1 TABLET BY MOUTH DAILY WITH EVENING MEALS 10/13/20   Dorothyann Peng, MD  methocarbamol (ROBAXIN) 500 MG tablet Take 1 tablet (500 mg total) by mouth 2 (two) times daily as needed. To be taken after surgery 10/20/20   Cristie Hem, PA-C  mometasone (ELOCON) 0.1 % cream APPLY TO AFFECTED AREA EVERY DAY 10/29/20   Arnette Felts, FNP  Olmesartan-amLODIPine-HCTZ 20-5-12.5 MG TABS TAKE 1 TABLET BY MOUTH EVERY DAY 10/03/20   Dorothyann Peng, MD  olmesartan-hydrochlorothiazide (BENICAR HCT) 40-25 MG tablet TAKE 1 TABLET BY MOUTH EVERY DAY 10/29/20   Dorothyann Peng, MD  ondansetron (ZOFRAN) 4 MG tablet Take 1 tablet (4 mg total) by mouth every 8 (eight) hours as needed for nausea  or vomiting. 10/20/20   Cristie Hem, PA-C  oxyCODONE-acetaminophen (PERCOCET) 5-325 MG tablet Take 1-2 tablets by mouth every 6 (six) hours as needed. To be taken after surgery Patient taking differently: Take 1-2 tablets by mouth every 6 (six) hours as needed. To be taken after surgery- hip 10/20/20   Cristie Hem, PA-C  topiramate (TOPAMAX) 100 MG tablet Take 1 tablet (100 mg total) by mouth at bedtime. 09/09/20   Levert Feinstein, MD     Family History  Problem Relation Age of Onset   Heart disease Mother    CAD Mother    Stroke Mother    Heart disease Sister    Heart attack Sister    Liver disease Brother    Other Father        unknown medical history   CAD Brother    Colon cancer Neg Hx    Esophageal cancer Neg Hx    Rectal cancer Neg Hx    Stomach cancer Neg Hx     Social History   Socioeconomic History   Marital status: Divorced    Spouse name: Not on file   Number of children: 1   Years of education: Not on file   Highest education level: Some college, no degree   Occupational History   Occupation: Control and instrumentation engineer  Tobacco Use   Smoking status: Never   Smokeless tobacco: Never  Vaping Use   Vaping Use: Never used  Substance and Sexual Activity   Alcohol use: Not Currently    Alcohol/week: 2.0 standard drinks    Types: 2 Glasses of wine per week    Comment: social- 2 wine or 2 beers   Drug use: Never   Sexual activity: Yes    Birth control/protection: Post-menopausal  Other Topics Concern   Not on file  Social History Narrative   Right-handed.   Occasional caffeine.   Lives alone.   Social Determinants of Health   Financial Resource Strain: Not on file  Food Insecurity: Not on file  Transportation Needs: Not on file  Physical Activity: Not on file  Stress: Not on file  Social Connections: Not on file     Review of Systems: A 12 point ROS discussed and pertinent positives are indicated in the HPI above.  All other systems are negative.  Review of Systems  Constitutional:  Negative for activity change, fatigue and fever.  HENT:  Negative for tinnitus and trouble swallowing.   Eyes:  Negative for visual disturbance.  Respiratory:  Negative for cough and shortness of breath.   Cardiovascular:  Negative for chest pain.  Gastrointestinal:  Negative for abdominal pain, nausea and vomiting.  Musculoskeletal:  Negative for back pain and gait problem.  Neurological:  Positive for dizziness and numbness. Negative for tremors, seizures, syncope, facial asymmetry, speech difficulty, weakness, light-headedness and headaches.       Left sided arm/hand numbness and tingling yesterday  Some dizziness--- all for approx 2 days Resolved now  Psychiatric/Behavioral:  Negative for confusion.    Vital Signs: BP (!) 161/82   Pulse 84   Temp (!) 97.5 F (36.4 C)   Resp 19   Ht  (1.549 m)   Wt 195 lb (88.5 kg)   LMP 06/03/2011   SpO2 100%   BMI 36.84 kg/m   Physical Exam Vitals reviewed.  HENT:     Mouth/Throat:      Mouth: Mucous membranes are moist.  Eyes:     Extraocular Movements:  Extraocular movements intact.  Cardiovascular:     Rate and Rhythm: Normal rate and regular rhythm.     Heart sounds: Normal heart sounds.  Pulmonary:     Effort: Pulmonary effort is normal.     Breath sounds: Normal breath sounds. No wheezing.  Abdominal:     Palpations: Abdomen is soft.     Tenderness: There is no abdominal tenderness.  Musculoskeletal:        General: Normal range of motion.     Right lower leg: No edema.     Left lower leg: No edema.  Skin:    General: Skin is warm.  Neurological:     Mental Status: She is alert and oriented to person, place, and time.  Psychiatric:        Mood and Affect: Mood normal.        Behavior: Behavior normal.        Thought Content: Thought content normal.        Judgment: Judgment normal.    Imaging: CT Head Wo Contrast  Result Date: 11/04/2020 CLINICAL DATA:  Left hand numbness for 2 days. Dizziness starting last night. Patient scheduled for stent placement in the left carotid artery tomorrow. EXAM: CT HEAD WITHOUT CONTRAST TECHNIQUE: Contiguous axial images were obtained from the base of the skull through the vertex without intravenous contrast. COMPARISON:  CT head 03/18/2020 and earlier FINDINGS: Brain: No evidence of acute infarction, hemorrhage, hydrocephalus, extra-axial collection or mass lesion/mass effect. Vascular: No hyperdense vessel. Tiny linear hyperdensity in the left basal ganglia is unchanged compared to exams dating back to 12/22/2016, likely vascular calcification. Skull: Normal. Negative for fracture or focal lesion. Sinuses/Orbits: No acute finding. Other: None. IMPRESSION: No acute intracranial abnormality. Electronically Signed   By: Sherron Ales M.D.   On: 11/04/2020 11:23   IR US Guide Vasc Access Right  Result Date: 10/08/2020 CLINICAL DATA:  Patient with known history of right-sided internal carotid artery occlusion. Recent ultrasound  reporting increased velocities in the proximal left internal carotid artery suspicious for worsening stenosis. EXAM: IR ANGIO INTRA EXTRACRAN SEL INTERNAL CAROTID BILAT MOD SED COMPARISON:  Diagnostic catheter arteriogram of August 10, 2019. MEDICATIONS: Heparin 2000 units IA. No antibiotic was administered within 1 hour of the procedure. ANESTHESIA/SEDATION: Versed 0.5 mg IV; Fentanyl 100 mcg IV Moderate Sedation Time:  87 minutes The patient was continuously monitored during the procedure by the interventional radiology nurse under my direct supervision. CONTRAST:  Omnipaque 240 80 mL. FLUOROSCOPY TIME:  Fluoroscopy Time: 22 minutes 42 seconds (1065 mGy). COMPLICATIONS: None immediate. TECHNIQUE: Informed written consent was obtained from the patient after a thorough discussion of the procedural risks, benefits and alternatives. All questions were addressed. Maximal Sterile Barrier Technique was utilized including caps, mask, sterile gowns, sterile gloves, sterile drape, hand hygiene and skin antiseptic. A timeout was performed prior to the initiation of the procedure. The right forearm was prepped and draped in the usual sterile manner. The right radial artery was then identified with ultrasound, and its morphology documented by imaging. A dorsal palmar anastomosis was verified to be present Using ultrasound guidance, and a micropuncture set access into the right radial artery was obtained over an 018 inch micro guidewire. A 4/5 French radial sheath was inserted. The obturator, and micro guidewire were removed. Good aspiration was obtained from the side port of the radial sheath. A cocktail of 2000 units of heparin, 2.5 mg of verapamil, and 200 mcg of nitroglycerin was then infused in diluted form  through the radial sheath without event. A right radial arteriogram was performed. Over a 0.035 inch Roadrunner guidewire, a Simmons 2 and then a Simmons 3 diagnostic catheter was advanced to the aortic arch region.  Cannulations were obtained of the right vertebral artery. However, due to tortuous anatomy, access into the common carotid arteries and the left vertebral artery could not be obtained. A right common femoral artery approach was then obtained. The right groin was prepped and draped in the usual sterile fashion. Thereafter using modified Seldinger technique, transfemoral access into the right common femoral artery was obtained without difficulty. Over a 0.035 inch guidewire, a 5 French Pinnacle sheath was inserted. Through this, and also over 0.035 inch guidewire, a 5 [place] JB 1 catheter was advanced to the aortic arch region and selectively positioned in the right common carotid artery, the left common carotid artery and the left vertebral artery. FINDINGS: The right vertebral artery origin is widely patent. The vessel is seen to opacify to the cranial skull base. Wide patency is seen of the right vertebrobasilar junction with hypoplastic right posterior-inferior cerebellar artery. Opacified portions of the basilar artery, the posterior cerebral arteries, the superior cerebellar arteries and the anterior-inferior cerebellar arteries into the capillary and venous phases is widely patent. Unopacified blood is seen in the basilar artery from the contralateral vertebral artery. Also seen is prompt retrograde opacification via posterior communicating artery on the right side into the supraclinoid right ICA and subsequently the right anterior cerebral artery and the right middle cerebral artery into the capillary and venous phases. The right common carotid arteriogram demonstrates widely patent right external carotid artery and its major branches. A stump of a previously occluded right internal carotid artery remains stable. More distally there is partial reconstitution of the right internal carotid artery in the caval cavernous segment from the external carotid artery branches notably the ipsilateral ophthalmic artery.  Mixing of unopacified blood is seen in the supraclinoid right ICA from the posterior circulation as described above. There is no antegrade clearance of contrast from the proximal cavernous segment consistent with occlusion of the right internal carotid artery proximally. The left common carotid arteriogram demonstrates the left external carotid artery and its major branches to be widely patent. The left internal carotid artery at the bulb has a severe tapered narrowing of approximately 75% without associated ulcerations or of intraluminal filling defects. The vessel is, otherwise, seen to opacify to the cranial skull base. Petrous segment is widely patent. There is an approximately 50% stenosis at the petrous cavernous junction. Mild fusiform dilatation is seen of the proximal cavernous and the caval segments. Left supraclinoid segment is widely patent. The left middle cerebral artery and the left anterior cerebral artery opacify into the capillary and venous phases. Prompt cross filling via the anterior communicating artery of the right anterior cerebral A2 segment and distally is noted. Also seen is transient opacification of the left posterior communicating artery and transiently the left posterior cerebral artery distribution. The left vertebral artery origin is widely patent. The vessel is seen to opacify to the cranial skull base. Wide patency is seen of the left posterior-inferior cerebellar artery and the left vertebrobasilar junction. The basilar artery, the posterior cerebral arteries, the superior cerebellar arteries and the anterior-inferior cerebellar arteries opacify into the capillary and venous phases. Again demonstrated is prompt retrograde opacification via the right posterior communicating artery of the right supraclinoid ICA, and right middle cerebral artery distributions. IMPRESSION: Angiographically occluded right internal carotid artery at the  bulb with partial reconstitution in the caval  cavernous segment from the external carotid artery branches via the ipsilateral ophthalmic artery. Approximately 75% tapered stenosis of the left internal carotid artery at the bulb. The right middle cerebral artery and the right A1 segment opacify from the left internal carotid artery via the anterior communicating artery. PLAN: Findings reviewed with the patient. Given the worsening left internal carotid artery proximally in the face of an occluded right internal carotid artery, the option of endovascular revascularization with stent assisted angioplasty in order to prevent future catastrophic neurologic events were reviewed. The other option was continued medical surveillance on anticoagulation/anti-platelet. The patient expressed her desire to proceed with revascularization endovascularly. This will be scheduled 3-4 weeks following her left hip surgery as per patient. Patient advised to call should she have any concerns or questions. Electronically Signed   By: Julieanne Cotton M.D.   On: 10/07/2020 13:16   DG Chest Portable 1 View  Result Date: 11/04/2020 CLINICAL DATA:  Lightheaded EXAM: PORTABLE CHEST 1 VIEW COMPARISON:  03/16/2020 FINDINGS: Borderline cardiomegaly. Lungs clear. No effusions or edema. No acute bony abnormality. IMPRESSION: Borderline cardiomegaly.  No active disease. Electronically Signed   By: Charlett Nose M.D.   On: 11/04/2020 18:02   IR ANGIO VERTEBRAL SEL SUBCLAVIAN INNOMINATE BILAT MOD SED  Result Date: 10/08/2020 CLINICAL DATA:  Patient with known history of right-sided internal carotid artery occlusion. Recent ultrasound reporting increased velocities in the proximal left internal carotid artery suspicious for worsening stenosis. EXAM: IR ANGIO INTRA EXTRACRAN SEL INTERNAL CAROTID BILAT MOD SED COMPARISON:  Diagnostic catheter arteriogram of August 10, 2019. MEDICATIONS: Heparin 2000 units IA. No antibiotic was administered within 1 hour of the procedure. ANESTHESIA/SEDATION:  Versed 0.5 mg IV; Fentanyl 100 mcg IV Moderate Sedation Time:  87 minutes The patient was continuously monitored during the procedure by the interventional radiology nurse under my direct supervision. CONTRAST:  Omnipaque 240 80 mL. FLUOROSCOPY TIME:  Fluoroscopy Time: 22 minutes 42 seconds (1065 mGy). COMPLICATIONS: None immediate. TECHNIQUE: Informed written consent was obtained from the patient after a thorough discussion of the procedural risks, benefits and alternatives. All questions were addressed. Maximal Sterile Barrier Technique was utilized including caps, mask, sterile gowns, sterile gloves, sterile drape, hand hygiene and skin antiseptic. A timeout was performed prior to the initiation of the procedure. The right forearm was prepped and draped in the usual sterile manner. The right radial artery was then identified with ultrasound, and its morphology documented by imaging. A dorsal palmar anastomosis was verified to be present Using ultrasound guidance, and a micropuncture set access into the right radial artery was obtained over an 018 inch micro guidewire. A 4/5 French radial sheath was inserted. The obturator, and micro guidewire were removed. Good aspiration was obtained from the side port of the radial sheath. A cocktail of 2000 units of heparin, 2.5 mg of verapamil, and 200 mcg of nitroglycerin was then infused in diluted form through the radial sheath without event. A right radial arteriogram was performed. Over a 0.035 inch Roadrunner guidewire, a Simmons 2 and then a Simmons 3 diagnostic catheter was advanced to the aortic arch region. Cannulations were obtained of the right vertebral artery. However, due to tortuous anatomy, access into the common carotid arteries and the left vertebral artery could not be obtained. A right common femoral artery approach was then obtained. The right groin was prepped and draped in the usual sterile fashion. Thereafter using modified Seldinger technique,  transfemoral access into the  right common femoral artery was obtained without difficulty. Over a 0.035 inch guidewire, a 5 French Pinnacle sheath was inserted. Through this, and also over 0.035 inch guidewire, a 5 [place] JB 1 catheter was advanced to the aortic arch region and selectively positioned in the right common carotid artery, the left common carotid artery and the left vertebral artery. FINDINGS: The right vertebral artery origin is widely patent. The vessel is seen to opacify to the cranial skull base. Wide patency is seen of the right vertebrobasilar junction with hypoplastic right posterior-inferior cerebellar artery. Opacified portions of the basilar artery, the posterior cerebral arteries, the superior cerebellar arteries and the anterior-inferior cerebellar arteries into the capillary and venous phases is widely patent. Unopacified blood is seen in the basilar artery from the contralateral vertebral artery. Also seen is prompt retrograde opacification via posterior communicating artery on the right side into the supraclinoid right ICA and subsequently the right anterior cerebral artery and the right middle cerebral artery into the capillary and venous phases. The right common carotid arteriogram demonstrates widely patent right external carotid artery and its major branches. A stump of a previously occluded right internal carotid artery remains stable. More distally there is partial reconstitution of the right internal carotid artery in the caval cavernous segment from the external carotid artery branches notably the ipsilateral ophthalmic artery. Mixing of unopacified blood is seen in the supraclinoid right ICA from the posterior circulation as described above. There is no antegrade clearance of contrast from the proximal cavernous segment consistent with occlusion of the right internal carotid artery proximally. The left common carotid arteriogram demonstrates the left external carotid artery and  its major branches to be widely patent. The left internal carotid artery at the bulb has a severe tapered narrowing of approximately 75% without associated ulcerations or of intraluminal filling defects. The vessel is, otherwise, seen to opacify to the cranial skull base. Petrous segment is widely patent. There is an approximately 50% stenosis at the petrous cavernous junction. Mild fusiform dilatation is seen of the proximal cavernous and the caval segments. Left supraclinoid segment is widely patent. The left middle cerebral artery and the left anterior cerebral artery opacify into the capillary and venous phases. Prompt cross filling via the anterior communicating artery of the right anterior cerebral A2 segment and distally is noted. Also seen is transient opacification of the left posterior communicating artery and transiently the left posterior cerebral artery distribution. The left vertebral artery origin is widely patent. The vessel is seen to opacify to the cranial skull base. Wide patency is seen of the left posterior-inferior cerebellar artery and the left vertebrobasilar junction. The basilar artery, the posterior cerebral arteries, the superior cerebellar arteries and the anterior-inferior cerebellar arteries opacify into the capillary and venous phases. Again demonstrated is prompt retrograde opacification via the right posterior communicating artery of the right supraclinoid ICA, and right middle cerebral artery distributions. IMPRESSION: Angiographically occluded right internal carotid artery at the bulb with partial reconstitution in the caval cavernous segment from the external carotid artery branches via the ipsilateral ophthalmic artery. Approximately 75% tapered stenosis of the left internal carotid artery at the bulb. The right middle cerebral artery and the right A1 segment opacify from the left internal carotid artery via the anterior communicating artery. PLAN: Findings reviewed with the  patient. Given the worsening left internal carotid artery proximally in the face of an occluded right internal carotid artery, the option of endovascular revascularization with stent assisted angioplasty in order to prevent future  catastrophic neurologic events were reviewed. The other option was continued medical surveillance on anticoagulation/anti-platelet. The patient expressed her desire to proceed with revascularization endovascularly. This will be scheduled 3-4 weeks following her left hip surgery as per patient. Patient advised to call should she have any concerns or questions. Electronically Signed   By: Julieanne Cotton M.D.   On: 10/07/2020 13:16    Labs:  CBC: Recent Labs    03/18/20 0205 10/07/20 0650 10/17/20 0905 11/04/20 1003  WBC 8.5 7.9 6.6 6.6  HGB 11.0* 12.0 12.3 12.0  HCT 35.2* 38.1 38.2 37.6  PLT 247 319 310 282    COAGS: Recent Labs    10/07/20 0650 10/17/20 0905  INR 1.1 1.2  APTT  --  29    BMP: Recent Labs    03/18/20 0205 06/26/20 1232 10/07/20 0650 10/17/20 0905 11/04/20 1003  NA 139 141 137 138 137  K 3.7 3.9 3.3* 3.6 3.2*  CL 109 104 105 106 104  CO2 GLUCOSE 112* 101* 113* 108* 119*  BUN CALCIUM 9.2 9.8 9.5 9.7 9.4  CREATININE 0.87 0.89 1.03* 0.96 1.00  GFRNONAA >60  --  >60 >60 >60    LIVER FUNCTION TESTS: Recent Labs    03/11/20 1655 10/17/20 0905  BILITOT 0.6 0.8  AST 19 20  ALT 21 22  ALKPHOS 87 86  PROT 7.6 7.9  ALBUMIN 4.7 4.1    TUMOR MARKERS: No results for input(s): AFPTM, CEA, CA199, CHROMGRNA in the last 8760 hours.  Assessment and Plan:  Left internal carotid artery stenosis Scheduled for cerebral arteriogram with possible angioplasty/stent placement Risks and benefits of cerebral angiogram with intervention were discussed with the patient including, but not limited to bleeding, infection, vascular injury, contrast induced renal failure, stroke or even death.  This  interventional procedure involves the use of X-rays and because of the nature of the planned procedure, it is possible that we will have prolonged use of X-ray fluoroscopy.  Potential radiation risks to you include (but are not limited to) the following: - A slightly elevated risk for cancer  several years later in life. This risk is typically less than 0.5% percent. This risk is low in comparison to the normal incidence of human cancer, which is 33% for women and 50% for men according to the [employer]. - Radiation induced injury can include skin redness, resembling a rash, tissue breakdown / ulcers and hair loss (which can be temporary or permanent).   The likelihood of either of these occurring depends on the difficulty of the procedure and whether you are sensitive to radiation due to previous procedures, disease, or genetic conditions.   IF your procedure requires a prolonged use of radiation, you will be notified and given written instructions for further action.  It is your responsibility to monitor the irradiated area for the 2 weeks following the procedure and to notify your physician if you are concerned that you have suffered a radiation induced injury.    All of the patient's questions were answered, patient is agreeable to proceed.  Consent signed and in chart.  [id number]  164 on 11/03/20 Dr Corliss Skains to discuss with pt and determine if moving ahead   Thank you for this interesting consult.  I greatly enjoyed meeting [name] and look forward to participating in their care.  A copy of this report was sent to the requesting provider on this date.  Electronically Signed: Robet Leu, PA-C 11/05/2020, 7:09 AM   I spent a total of  30 Minutes   in face to face in clinical consultation, greater than 50% of which was counseling/coordinating care for L ICA revascularization

## 2020-11-05 NOTE — Transfer of Care (Signed)
Immediate Anesthesia Transfer of Care Note  Patient: [name]  Procedure(s) Performed: STENTING  Patient Location: PACU  Anesthesia Type:MAC  Level of Consciousness: awake and patient cooperative  Airway & Oxygen Therapy: Patient Spontanous Breathing and Patient connected to nasal cannula oxygen  Post-op Assessment: Report given to RN and Post -op Vital signs reviewed and stable  Post vital signs: Reviewed and stable  Last Vitals:  Vitals Value Taken Time  BP 121/61 11/05/20 1101  Temp    Pulse 58 11/05/20 1111  Resp 16 11/05/20 1111  SpO2 100 % 11/05/20 1111  Vitals shown include unvalidated device data.  Last Pain:  Vitals:   11/05/20 0709  PainSc: 0-No pain         Complications: No notable events documented.

## 2020-11-05 NOTE — Progress Notes (Signed)
Paged neuro IR provider on call x2 to discuss blood pressure being below parameters, and that arterial line and BP cuff pressures are not correlating. Awaiting call back. Will continue to monitor.

## 2020-11-05 NOTE — Anesthesia Procedure Notes (Signed)
Procedure Name: MAC Date/Time: 11/05/2020 8:55 AM Performed by: Renato Shin, CRNA Pre-anesthesia Checklist: Patient identified, Emergency Drugs available, Suction available and Patient being monitored Patient Re-evaluated:Patient Re-evaluated prior to induction Oxygen Delivery Method: Nasal cannula Preoxygenation: Pre-oxygenation with 100% oxygen Induction Type: IV induction Placement Confirmation: positive ETCO2 and breath sounds checked- equal and bilateral Dental Injury: Teeth and Oropharynx as per pre-operative assessment

## 2020-11-05 NOTE — Progress Notes (Signed)
1348: Received report from PACU RN. PACU RN notified this RN of "oozing" from patient's femoral site. Reported that Dr. Corliss Skains had not been notified of bleeding.   1357: Dr. Corliss Skains notified over phone that patient coming to 4N15 was bleeding per PACU RN. Verbal order given to hold pressure for 15 mins when patient arrives to 4NICU.   1405: Patient arrived to 4N15. Groin site noted to have bloody serosanguinous drainage covering entire dressing. PACU RN reported this as the same as her last assessment. Upon holding pressure, this RN noted that dressing became completely saturated with bright red frank blood, draining down inner thigh on to pad. Dressing removed from groin site and replaced with clean gauze while holding pressure.  1420: Pressure removed from groin site, no further bleeding noticed. Surrounding area soft without evidence of hematoma. New gauze dressing applied. Dressing clean, dry, intact.   1500: Dr. Corliss Skains at bedside, instructed this RN to notify him of further bleeding and apply a QuickClot dressing if bleeding reoccurs.

## 2020-11-05 NOTE — Progress Notes (Signed)
Upon sitting patient up to 30 degrees, conversion from NSR to afib. All other VSS. Patient reports feeling the same as before. Dr. Corliss Skains notified, instructed RN to continue heparin drip until she can take her morning dose of cardizem tomorrow.

## 2020-11-05 NOTE — Procedures (Signed)
S/P Lt common carotid arteriogram followed by stent assisted angioplasty of Lt ICA prox stenosis with distal protection. RT CFA approach. MAC anesthesia. Post CT brain No ICH.  Denies any H/As,N/V visual or speech difficulties. Pupils 2  to 3 mm RT = LT  No facial asymmetry. Tongue midline. Moves all 4s equally  RT CFA hemostasis with 37F angioseal.  Distal,pulses intact bilaterally.  Rebekah Cotta MD

## 2020-11-05 NOTE — Sedation Documentation (Signed)
Dr. Corliss Skains closed femoral site with a 8 fr Angioseal at 1033.  Site remains level 0 with dressing clean/dry/intact with pulses intact.

## 2020-11-06 ENCOUNTER — Encounter (HOSPITAL_COMMUNITY): Payer: Self-pay | Admitting: Interventional Radiology

## 2020-11-06 LAB — CBC WITH DIFFERENTIAL/PLATELET
Abs Immature Granulocytes: 0.03 10*3/uL (ref 0.00–0.07)
Basophils Absolute: 0 10*3/uL (ref 0.0–0.1)
Basophils Relative: 0 %
Eosinophils Absolute: 0 10*3/uL (ref 0.0–0.5)
Eosinophils Relative: 0 %
HCT: 29.1 % — ABNORMAL LOW (ref 36.0–46.0)
Hemoglobin: 9.4 g/dL — ABNORMAL LOW (ref 12.0–15.0)
Immature Granulocytes: 0 %
Lymphocytes Relative: 26 %
Lymphs Abs: 2.7 10*3/uL (ref 0.7–4.0)
MCH: 28.6 pg (ref 26.0–34.0)
MCHC: 32.3 g/dL (ref 30.0–36.0)
MCV: 88.4 fL (ref 80.0–100.0)
Monocytes Absolute: 0.8 10*3/uL (ref 0.1–1.0)
Monocytes Relative: 8 %
Neutro Abs: 6.5 10*3/uL (ref 1.7–7.7)
Neutrophils Relative %: 66 %
Platelets: 261 10*3/uL (ref 150–400)
RBC: 3.29 MIL/uL — ABNORMAL LOW (ref 3.87–5.11)
RDW: 13.8 % (ref 11.5–15.5)
WBC: 10.1 10*3/uL (ref 4.0–10.5)
nRBC: 0 % (ref 0.0–0.2)

## 2020-11-06 LAB — BASIC METABOLIC PANEL
Anion gap: 5 (ref 5–15)
BUN: 14 mg/dL (ref 6–20)
CO2: 19 mmol/L — ABNORMAL LOW (ref 22–32)
Calcium: 8.5 mg/dL — ABNORMAL LOW (ref 8.9–10.3)
Chloride: 108 mmol/L (ref 98–111)
Creatinine, Ser: 1.14 mg/dL — ABNORMAL HIGH (ref 0.44–1.00)
GFR, Estimated: 55 mL/min — ABNORMAL LOW (ref >60)
Glucose, Bld: 117 mg/dL — ABNORMAL HIGH (ref 70–99)
Potassium: 3.5 mmol/L (ref 3.5–5.1)
Sodium: 132 mmol/L — ABNORMAL LOW (ref 135–145)

## 2020-11-06 LAB — PLATELET INHIBITION P2Y12

## 2020-11-06 LAB — APTT: aPTT: 33 seconds (ref 24–36)

## 2020-11-06 MED ORDER — APIXABAN 5 MG PO TABS
5.0000 mg | ORAL_TABLET | Freq: Two times a day (BID) | ORAL | Status: DC
  Administered 2020-11-06: 5 mg via ORAL
  Filled 2020-11-06: qty 1

## 2020-11-06 MED ORDER — HEPARIN (PORCINE) 25000 UT/250ML-% IV SOLN
800.0000 [IU]/h | INTRAVENOUS | Status: DC
  Filled 2020-11-06: qty 250

## 2020-11-06 MED ORDER — SODIUM CHLORIDE 0.9 % IV BOLUS
500.0000 mL | Freq: Once | INTRAVENOUS | Status: AC
  Administered 2020-11-06: 500 mL via INTRAVENOUS

## 2020-11-06 NOTE — Evaluation (Signed)
Physical Therapy Evaluation Patient Details Name: [name] MRN: [id number] DOB: 04-21-1960 Today's Date: 11/06/2020  History of Present Illness  60 yo female admitted s/p L ICA arteriogram by stent . PMH: hx CAD (s/p PCI to CX 2020), HTN, carotid artery disease (R ICA CTO, L ICA 75% - see below), PAF, HTN, stroke, subarachnoid hemorrhage (07/2019), OSA  Clinical Impression  Pt admitted secondary to problem above with deficits below. Pt requiring min guard to supervision for mobility tasks. Pt with mild L hip pain, but has at baseline. Attempted 2 gait trials. HR elevating to 150 during initial ambulation trial and required seated rest to return to upper 80s. On second attempt, HR elevating to 155, so further mobility deferred. Anticipate pt will progress well once mobility improves and will not require follow up PT at d/c. Reports she plans to follow up with ortho for L THA in future. Will continue to follow acutely.      Recommendations for follow up therapy are one component of a multi-disciplinary discharge planning process, led by the attending physician.  Recommendations may be updated based on patient status, additional functional criteria and insurance authorization.  Follow Up Recommendations No PT follow up    Assistance Recommended at Discharge PRN  Functional Status Assessment Patient has not had a recent decline in their functional status  Equipment Recommendations  None recommended by PT    Recommendations for Other Services       Precautions / Restrictions Precautions Precautions: Other (comment) Precaution Comments: soft collar for comfort Required Braces or Orthoses: Cervical Brace Restrictions Weight Bearing Restrictions: No      Mobility  Bed Mobility Overal bed mobility: Modified Independent             General bed mobility comments: hob 30 degrees    Transfers Overall transfer level: Needs assistance Equipment used: None Transfers: Sit  to/from Stand Sit to Stand: Min guard           General transfer comment: Min guard for safety.    Ambulation/Gait Ambulation/Gait assistance: Min guard Gait Distance (Feet): 100 Feet (100, 30) Assistive device: None Gait Pattern/deviations: Step-through pattern;Decreased stride length;Decreased weight shift to left Gait velocity: Decreased   General Gait Details: Mildly antalgic gait. Min guard to supervision for safety. No LOB noted. Pt reports ambulation is at baseline. HR elevating to 150 during initial ambulation trial and required seated rest to return to upper 80s. On second attempt, HR elevating to 155, so further mobility deferred.  Stairs            Wheelchair Mobility    Modified Rankin (Stroke Patients Only)       Balance Overall balance assessment: Mild deficits observed, not formally tested                                           Pertinent Vitals/Pain Pain Assessment: No/denies pain    Home Living Family/patient expects to be discharged to:: Private residence Living Arrangements: Alone Available Help at Discharge: Family;Available PRN/intermittently Type of Home: House Home Access: Level entry       Home Layout: One level Home Equipment: None Additional Comments: no animals. son and friend Mindi Junker    Prior Function Prior Level of Function : Independent/Modified Independent;Driving                     Higher education careers adviser  Dominance   Dominant Hand: Right    Extremity/Trunk Assessment   Upper Extremity Assessment Upper Extremity Assessment: Defer to OT evaluation    Lower Extremity Assessment Lower Extremity Assessment: LLE deficits/detail LLE Deficits / Details: L hip pain at baseline and pending L THA    Cervical / Trunk Assessment Cervical / Trunk Assessment: Normal  Communication   Communication: No difficulties  Cognition Arousal/Alertness: Awake/alert Behavior During Therapy: WFL for tasks  assessed/performed Overall Cognitive Status: Within Functional Limits for tasks assessed                                          General Comments     Exercises     Assessment/Plan    PT Assessment Patient needs continued PT services  PT Problem List Decreased range of motion;Decreased activity tolerance;Decreased mobility;Decreased knowledge of use of DME;Decreased knowledge of precautions       PT Treatment Interventions DME instruction;Gait training;Functional mobility training;Therapeutic activities;Balance training;Therapeutic exercise;Patient/family education    PT Goals (Current goals can be found in the Care Plan section)  Acute Rehab PT Goals Patient Stated Goal: to go home PT Goal Formulation: With patient Time For Goal Achievement: 11/20/20 Potential to Achieve Goals: Good    Frequency Min 3X/week   Barriers to discharge        Co-evaluation               AM-PAC PT "6 Clicks" Mobility  Outcome Measure Help needed turning from your back to your side while in a flat bed without using bedrails?: None Help needed moving from lying on your back to sitting on the side of a flat bed without using bedrails?: None Help needed moving to and from a bed to a chair (including a wheelchair)?: A Little Help needed standing up from a chair using your arms (e.g., wheelchair or bedside chair)?: A Little Help needed to walk in hospital room?: A Little Help needed climbing 3-5 steps with a railing? : A Little 6 Click Score: 20    End of Session   Activity Tolerance: Treatment limited secondary to medical complications (Comment) (elevated HR) Patient left: in chair;with call bell/phone within reach Nurse Communication: Mobility status;Other (comment) (elevated HR) PT Visit Diagnosis: Other abnormalities of gait and mobility (R26.89);Pain Pain - Right/Left: Left Pain - part of body: Hip    Time: 1115-1130 PT Time Calculation (min) (ACUTE ONLY): 15  min   Charges:   PT Evaluation $PT Eval Moderate Complexity: 1 Mod          Farley Ly, PT, DPT  Acute Rehabilitation Services  Pager: [phone] Office: [phone]   Lehman Prom 11/06/2020, 12:47 PM

## 2020-11-06 NOTE — Progress Notes (Addendum)
Neuro IR provider on call Dr. Quay Burow paged about patient's blood pressure. Awaiting return call. Will continue to monitor.

## 2020-11-06 NOTE — Progress Notes (Signed)
Dr. Quay Burow of neuro IR updated about patient's blood pressure being below parameters of SBP 120-140. MD made aware that patient is currently neuro intact, no bleeding noted at groin site, no palpable or visible hematoma, distal pulses palpable, extremities warm. MD made aware of patient's urine output of 250 mL so far this shift. Verbal orders for a 500 mL bolus of normal saline. Will continue to monitor.

## 2020-11-06 NOTE — Progress Notes (Signed)
ANTICOAGULATION CONSULT NOTE - Follow Up Consult  Pharmacy Consult for heparin Indication: atrial fibrillation and s/p arteriogram and stenting of L ICA  Labs: Recent Labs    11/04/20 1003 11/04/20 1731 11/05/20 0622 11/05/20 1805 11/06/20 0325  HGB 12.0  --   --   --  9.4*  HCT 37.6  --   --   --  29.1*  PLT 282  --   --   --  261  APTT  --   --   --  33 33  LABPROT  --   --  14.1  --   --   INR  --   --  1.1  --   --   HEPARINUNFRC  --   --   --  0.60  --   CREATININE 1.00  --   --   --  1.14*  TROPONINIHS  --  5  --   --   --     Assessment: 60yo female remains subtherapeutic on heparin with no change in PTT despite infusion rate increase; no infusion issues or signs of bleeding per RN.  Goal of Therapy:  aPTT 50-60 seconds   Plan:  Will increase heparin infusion by 2 units/kg/hr to 800 units/hr until off for sheath removal; f/u transition back to Eliquis vs resume heparin for Afib.    Vernard Gambles, PharmD, BCPS  11/06/2020,5:05 AM

## 2020-11-06 NOTE — Discharge Summary (Signed)
Patient ID: [name] MRN: [id number] DOB/AGE: 02/02/1960 60 y.o.  Admit date: 11/05/2020 Discharge date: 11/06/2020  Supervising Physician: Julieanne Cotton  Patient Status: [facility] - In-pt  Admission Diagnoses: Internal Carotid Artery Stenosis, left; S/p stent-assisted angioplasty  Discharge Diagnoses:  Active Problems:   Internal carotid artery stenosis, left  Discharged Condition: good  Hospital Course: Patient with a history of right carotid artery occlusion and left carotid artery stenosis. She presented to Neuro Interventional Radiology 11/05/20 for an elective cerebral angiogram with possible intervention under general anesthesia and with planned overnight observation. She received a stent to the left proximal ICA via right common femoral artery. She was extubated post-procedure without incident and admitted to the Neuro ICU unit for overnight observation. She has a history of atrial fibrillation and was noted to convert from NSR to atrial fibrillation around 1700 yesterday afternoon. She also had some mild hypotension and was given boluses of normal saline. She had an otherwise uneventful night.   She was assessed at the bedside this morning with Dr. Corliss Skains. She denies any pain or discomfort and is neurologically intact. Her right groin vascular site is clean, soft, dry and without evidence of hematoma. A P2Y12 level was drawn this morning and an EKG showed rate-controlled atrial fibrillation. She is stable for discharge once she has ambulated and been evaluated by PT/OT.  She was instructed to discontinue taking aspirin and will take Eliquis (5 mg BID) as prescribed by her cardiologist. She was encouraged to call her cardiologist today or tomorrow to notify them of developing atrial fibrillation while inpatient. She is to begin taking Brilinta 90 mg BID and this prescription was filled by the [facility] pharmacy and delivered to the bedside by a pharmacist. She was  instructed to avoid driving, bending/stooping or lifting greater than 10 lb for two weeks.   She will follow up with Dr. Corliss Skains in approximately two weeks. She knows she can call our office with any questions or concerns prior to her next visit.   Consults: Pharmacy for anticoagulation   Significant Diagnostic Studies: CT Head Wo Contrast  Result Date: 11/04/2020 CLINICAL DATA:  Left hand numbness for 2 days. Dizziness starting last night. Patient scheduled for stent placement in the left carotid artery tomorrow. EXAM: CT HEAD WITHOUT CONTRAST TECHNIQUE: Contiguous axial images were obtained from the base of the skull through the vertex without intravenous contrast. COMPARISON:  CT head 03/18/2020 and earlier FINDINGS: Brain: No evidence of acute infarction, hemorrhage, hydrocephalus, extra-axial collection or mass lesion/mass effect. Vascular: No hyperdense vessel. Tiny linear hyperdensity in the left basal ganglia is unchanged compared to exams dating back to 12/22/2016, likely vascular calcification. Skull: Normal. Negative for fracture or focal lesion. Sinuses/Orbits: No acute finding. Other: None. IMPRESSION: No acute intracranial abnormality. Electronically Signed   By: Sherron Ales M.D.   On: 11/04/2020 11:23   DG Chest Portable 1 View  Result Date: 11/04/2020 CLINICAL DATA:  Lightheaded EXAM: PORTABLE CHEST 1 VIEW COMPARISON:  03/16/2020 FINDINGS: Borderline cardiomegaly. Lungs clear. No effusions or edema. No acute bony abnormality. IMPRESSION: Borderline cardiomegaly.  No active disease. Electronically Signed   By: Charlett Nose M.D.   On: 11/04/2020 18:02    Treatments: Observation; anticoagulation; blood pressure management; PT/OT  Discharge Exam: Blood pressure 125/61, pulse 90, temperature 98.3 F (36.8 C), temperature source Oral, resp. rate 18, height 5\' 1"  (1.549 m), weight 195 lb (88.5 kg), last menstrual period 06/03/2011, SpO2 97 %. Physical Exam Constitutional:  General: She is not in acute distress. HENT:     Mouth/Throat:     Mouth: Mucous membranes are moist.     Pharynx: Oropharynx is clear.  Cardiovascular:     Rate and Rhythm: Normal rate and regular rhythm.  Pulmonary:     Effort: Pulmonary effort is normal.  Musculoskeletal:     Right lower leg: No edema.     Left lower leg: No edema.  Skin:    General: Skin is warm and dry.  Neurological:     Mental Status: She is alert and oriented to person, place, and time.     Cranial Nerves: No facial asymmetry.     Motor: Motor function is intact. No weakness.     Coordination: Finger-Nose-Finger Test normal.  Psychiatric:        Mood and Affect: Mood normal.        Behavior: Behavior normal.        Thought Content: Thought content normal.        Judgment: Judgment normal.    Disposition: Discharge disposition: 01-Home or Self Care   Allergies as of 11/06/2020       Reactions   Other Itching, Other (See Comments)   Patient experienced intense itching in her arm that was accessed for a past angiogram        Medication List     STOP taking these medications    aspirin EC 81 MG tablet   enoxaparin 150 MG/ML injection Commonly known as: LOVENOX       TAKE these medications    acetaminophen 500 MG tablet Commonly known as: TYLENOL Take 500-1,000 mg by mouth every 6 (six) hours as needed (pain or headaches).   atorvastatin 80 MG tablet Commonly known as: LIPITOR Take 1 tablet (80 mg total) by mouth daily at 6 PM.   Brilinta 90 MG Tabs tablet Generic drug: ticagrelor Take 1 tablet (90 mg total) by mouth 2 (two) times daily.   cholecalciferol 25 MCG (1000 UNIT) tablet Commonly known as: VITAMIN D3 Take 1,000 Units by mouth daily.   diltiazem 120 MG 24 hr capsule Commonly known as: CARDIZEM CD TAKE 1 CAPSULE BY MOUTH EVERY DAY   docusate sodium 100 MG capsule Commonly known as: Colace Take 1 capsule (100 mg total) by mouth daily as needed.   Eliquis 5 MG Tabs  tablet Generic drug: apixaban Take 1 tablet (5 mg total) by mouth 2 (two) times daily.   ezetimibe 10 MG tablet Commonly known as: ZETIA TAKE 1 TABLET BY MOUTH EVERY DAY   fluticasone 50 MCG/ACT nasal spray Commonly known as: FLONASE PLACE 2 SPRAYS IN BOTH NOSTRILS DAILY AS NEEDED FOR ALLERGIES OR RHINITIS   furosemide 40 MG tablet Commonly known as: LASIX DAILY FOR 3 DAYS AND THEN AS DIRECTED   hydrOXYzine 25 MG tablet Commonly known as: ATARAX/VISTARIL Take 1 tablet (25 mg total) by mouth 3 (three) times daily as needed.   levocetirizine 5 MG tablet Commonly known as: XYZAL TAKE 1 TABLET BY MOUTH EVERY DAY   Magnesium 250 MG Tabs TAKE 1 TABLET BY MOUTH DAILY WITH EVENING MEALS   methocarbamol 500 MG tablet Commonly known as: Robaxin Take 1 tablet (500 mg total) by mouth 2 (two) times daily as needed. To be taken after surgery   mometasone 0.1 % cream Commonly known as: ELOCON APPLY TO AFFECTED AREA EVERY DAY   Olmesartan-amLODIPine-HCTZ 20-5-12.5 MG Tabs TAKE 1 TABLET BY MOUTH EVERY DAY   olmesartan-hydrochlorothiazide 40-25 MG tablet  Commonly known as: BENICAR HCT TAKE 1 TABLET BY MOUTH EVERY DAY   ondansetron 4 MG tablet Commonly known as: Zofran Take 1 tablet (4 mg total) by mouth every 8 (eight) hours as needed for nausea or vomiting.   oxyCODONE-acetaminophen 5-325 MG tablet Commonly known as: Percocet Take 1-2 tablets by mouth every 6 (six) hours as needed. To be taken after surgery What changed: additional instructions   topiramate 100 MG tablet Commonly known as: TOPAMAX Take 1 tablet (100 mg total) by mouth at bedtime.        Follow-up Information     [facility] Follow up.   Why: Please follow up with Dr. Corliss Skains in two weeks. A scheduler from our office will call you with a date/time. Please call our office if you have any questions prior to your visit. Contact information: [address] [place]  [address] [phone]                  Electronically Signed: Mickie Kay, NP 11/06/2020, 11:11 AM   I have spent Less Than 30 Minutes discharging [employer].

## 2020-11-06 NOTE — Anesthesia Postprocedure Evaluation (Signed)
Anesthesia Post Note  Patient: [name]  Procedure(s) Performed: STENTING     Patient location during evaluation: PACU Anesthesia Type: MAC Level of consciousness: awake and alert Pain management: pain level controlled Vital Signs Assessment: post-procedure vital signs reviewed and stable Respiratory status: spontaneous breathing, nonlabored ventilation, respiratory function stable and patient connected to nasal cannula oxygen Cardiovascular status: stable and blood pressure returned to baseline Postop Assessment: no apparent nausea or vomiting Anesthetic complications: no   No notable events documented.  Last Vitals:  Vitals:   11/06/20 0700 11/06/20 0800  BP: 128/70 120/82  Pulse: 77 87  Resp: 14 (!) 21  Temp:  36.8 C  SpO2: 98% 98%    Last Pain:  Vitals:   11/06/20 0800  TempSrc: Oral  PainSc: [place]

## 2020-11-06 NOTE — Progress Notes (Signed)
D/C education given to Pt and all questions answered. No printed prescriptions to give or equipment to deliver, medications delivered to room prior to d/c. IV's removed. Pt taken to car with all belongings.

## 2020-11-06 NOTE — Evaluation (Signed)
Occupational Therapy Evaluation Patient Details Name: [name] MRN: [id number] DOB: 26-Sep-1960 Today's Date: 11/06/2020   History of Present Illness 60 yo female admitted s/p L ICA arteriogram by stent . PMH: hx CAD (s/p PCI to CX 2020), HTN, carotid artery disease (R ICA CTO, L ICA 75% - see below), PAF, HTN, stroke, subarachnoid hemorrhage (07/2019), OSA   Clinical Impression   Patient is s/p L ICA arteriogram  surgery resulting in functional limitations due to the deficits listed below (see OT problem list). Pt currently with elevated HR 157 with basic transfer and no symptoms. Pt with LB dressing deficits with pending L hip replacement. Pt could benefit from AE education now as precursor for next surgery.  Patient will benefit from skilled OT acutely to increase independence and safety with ADLS to allow discharge home.       Recommendations for follow up therapy are one component of a multi-disciplinary discharge planning process, led by the attending physician.  Recommendations may be updated based on patient status, additional functional criteria and insurance authorization.   Follow Up Recommendations  No OT follow up    Assistance Recommended at Discharge PRN  Functional Status Assessment  Patient has had a recent decline in their functional status and demonstrates the ability to make significant improvements in function in a reasonable and predictable amount of time.  Equipment Recommendations  None recommended by OT    Recommendations for Other Services       Precautions / Restrictions Precautions Precautions: Other (comment) Precaution Comments: soft collar for comfort Required Braces or Orthoses: Cervical Brace      Mobility Bed Mobility Overal bed mobility: Modified Independent             General bed mobility comments: hob 30 degrees    Transfers Overall transfer level: Needs assistance   Transfers: Sit to/from Stand Sit to Stand: Min guard                   Balance Overall balance assessment: Mild deficits observed, not formally tested                                         ADL either performed or assessed with clinical judgement   ADL Overall ADL's : Needs assistance/impaired Eating/Feeding: Independent   Grooming: Wash/dry hands;Wash/dry face;Modified independent   Upper Body Bathing: Min guard;Sitting   Lower Body Bathing: Min guard;Sit to/from stand   Upper Body Dressing : Min guard   Lower Body Dressing: Min guard;Sit to/from stand Lower Body Dressing Details (indicate cue type and reason): pt looping foot into underwear to complete task.pt will likely need AE after procedure. if pt stays can work on education next session. Toilet Transfer: Min guard           Functional mobility during ADLs: Min guard General ADL Comments: pt progressing well but with elevated HR 157 with sit<>Stand     Vision Baseline Vision/History: 1 Wears glasses Ability to See in Adequate Light: 0 Adequate       Perception     Praxis      Pertinent Vitals/Pain Pain Assessment: No/denies pain     Hand Dominance Right   Extremity/Trunk Assessment Upper Extremity Assessment Upper Extremity Assessment: Overall WFL for tasks assessed   Lower Extremity Assessment Lower Extremity Assessment: Defer to PT evaluation (pending L hip THA anterior surgery)   Cervical /  Trunk Assessment Cervical / Trunk Assessment: Normal   Communication Communication Communication: No difficulties   Cognition Arousal/Alertness: Awake/alert Behavior During Therapy: WFL for tasks assessed/performed Overall Cognitive Status: Within Functional Limits for tasks assessed                                       General Comments  HR 157 with rest 102    Exercises     Shoulder Instructions      Home Living Family/patient expects to be discharged to:: Private residence Living Arrangements:  Alone Available Help at Discharge: Family;Available PRN/intermittently Type of Home: House Home Access: Level entry     Home Layout: One level     Bathroom Shower/Tub: Chief Strategy Officer: Standard     Home Equipment: None   Additional Comments: no animals. son and friend Mindi Junker      Prior Functioning/Environment Prior Level of Function : Independent/Modified Independent;Driving                        OT Problem List: Impaired balance (sitting and/or standing);Decreased activity tolerance;Decreased knowledge of use of DME or AE      OT Treatment/Interventions: Self-care/ADL training;Therapeutic exercise;Energy conservation;DME and/or AE instruction;Therapeutic activities;Patient/family education;Balance training    OT Goals(Current goals can be found in the care plan section) Acute Rehab OT Goals Patient Stated Goal: to get hip replaced Potential to Achieve Goals: Good  OT Frequency: Min 2X/week   Barriers to D/C:            Co-evaluation              AM-PAC OT "6 Clicks" Daily Activity     Outcome Measure Help from another person eating meals?: A Little Help from another person taking care of personal grooming?: A Little Help from another person toileting, which includes using toliet, bedpan, or urinal?: A Little Help from another person bathing (including washing, rinsing, drying)?: A Little Help from another person to put on and taking off regular upper body clothing?: A Little Help from another person to put on and taking off regular lower body clothing?: A Little 6 Click Score: 18   End of Session Nurse Communication: Mobility status;Precautions  Activity Tolerance: Patient tolerated treatment well Patient left: in chair;with call bell/phone within reach;with chair alarm set  OT Visit Diagnosis: Unsteadiness on feet (R26.81);Muscle weakness (generalized) (M62.81)                Time: 6812-7517 OT Time Calculation (min): 21  min Charges:  OT General Charges $OT Visit: 1 Visit OT Evaluation $OT Eval Moderate Complexity: 1 Mod   Brynn, OTR/L  Acute Rehabilitation Services Pager: [phone] Office: [phone] .   Mateo Flow 11/06/2020, 11:37 AM

## 2020-11-06 NOTE — Progress Notes (Addendum)
Dr. Quay Burow paged. Number paged this attempt and all previous attempts at paging on call provider was correct number provided by [employer]. Awaiting return call.

## 2020-11-07 LAB — PLATELET INHIBITION P2Y12

## 2020-11-10 ENCOUNTER — Telehealth: Payer: Self-pay | Admitting: *Deleted

## 2020-11-10 NOTE — Telephone Encounter (Signed)
Transition Care Management Follow-up Telephone Call Date of discharge and from where:  [facility] 11/06/20 How have you been since you were released from the hospital? "Fine" Any questions or concerns? No  Items Reviewed: Did the pt receive and understand the discharge instructions provided? Yes   No driving or 2 weeks, No lifting and monitor bending. Medications obtained and verified? Yes  Other? No  Any new allergies since your discharge? No  Dietary orders reviewed? Yes Do you have support at home? Yes   Home Care and Equipment/Supplies: Were home health services ordered? no If so, what is the name of the agency? N/A  Has the agency set up a time to come to the patient's home? not applicable Were any new equipment or medical supplies ordered?  No What is the name of the medical supply agency?n/a Were you able to get the supplies/equipment? not applicable Do you have any questions related to the use of the equipment or supplies? N/a  Functional Questionnaire: (I = Independent and D = Dependent) ADLs: I  Bathing/Dressing- I  Meal Prep- I  Eating- I  Maintaining continence- I  Transferring/Ambulation- I  Managing Meds- I  Follow up appointments reviewed:  PCP Hospital f/u appt confirmed? Yes  Scheduled to see Dr. Celesta Gentile on 11/12/20 @ 2 pm Specialist Hospital f/u appt confirmed? Yes  Scheduled to see Dr. Eustace Quail on 11/20/20 @ 1 pm Are transportation arrangements needed? No If their condition worsens, is the pt aware to call PCP or go to the Emergency Dept.? Yes Was the patient provided with contact information for the PCP's office or ED? Yes Was to pt encouraged to call back with questions or concerns? Yes   Irving Shows [facility], BSN RN Case Manager [phone]

## 2020-11-11 ENCOUNTER — Encounter: Payer: 59 | Admitting: Orthopaedic Surgery

## 2020-11-12 ENCOUNTER — Ambulatory Visit (INDEPENDENT_AMBULATORY_CARE_PROVIDER_SITE_OTHER): Payer: 59 | Admitting: Nurse Practitioner

## 2020-11-12 ENCOUNTER — Other Ambulatory Visit: Payer: Self-pay

## 2020-11-12 VITALS — BP 122/64 | HR 70 | Temp 99.3°F | Ht 61.0 in | Wt 196.2 lb

## 2020-11-12 DIAGNOSIS — E559 Vitamin D deficiency, unspecified: Secondary | ICD-10-CM

## 2020-11-12 DIAGNOSIS — Z0000 Encounter for general adult medical examination without abnormal findings: Secondary | ICD-10-CM

## 2020-11-12 DIAGNOSIS — R7303 Prediabetes: Secondary | ICD-10-CM | POA: Diagnosis not present

## 2020-11-12 DIAGNOSIS — R82998 Other abnormal findings in urine: Secondary | ICD-10-CM

## 2020-11-12 DIAGNOSIS — R051 Acute cough: Secondary | ICD-10-CM | POA: Diagnosis not present

## 2020-11-12 DIAGNOSIS — I10 Essential (primary) hypertension: Secondary | ICD-10-CM

## 2020-11-12 DIAGNOSIS — E7800 Pure hypercholesterolemia, unspecified: Secondary | ICD-10-CM | POA: Diagnosis not present

## 2020-11-12 DIAGNOSIS — Z780 Asymptomatic menopausal state: Secondary | ICD-10-CM

## 2020-11-12 DIAGNOSIS — Z6837 Body mass index (BMI) 37.0-37.9, adult: Secondary | ICD-10-CM

## 2020-11-12 DIAGNOSIS — Z0001 Encounter for general adult medical examination with abnormal findings: Secondary | ICD-10-CM

## 2020-11-12 LAB — POCT UA - MICROALBUMIN
Albumin/Creatinine Ratio, Urine, POC: <30
Creatinine, POC: 50 mg/dL
Microalbumin Ur, POC: 10 mg/L

## 2020-11-12 LAB — POCT URINALYSIS DIPSTICK
Bilirubin, UA: NEGATIVE
Glucose, UA: NEGATIVE
Ketones, UA: NEGATIVE
Nitrite, UA: NEGATIVE
Protein, UA: NEGATIVE
Spec Grav, UA: 1.01 (ref 1.010–1.025)
Urobilinogen, UA: 0.2 E.U./dL
pH, UA: 5.5 (ref 5.0–8.0)

## 2020-11-12 MED ORDER — BENZONATATE 100 MG PO CAPS
100.0000 mg | ORAL_CAPSULE | Freq: Three times a day (TID) | ORAL | 1 refills | Status: DC | PRN
Start: 2020-11-12 — End: 2021-05-12

## 2020-11-12 MED ORDER — GUAIFENESIN-DM 100-10 MG/5ML PO SYRP: 5.0000 mL | ORAL_SOLUTION | ORAL | 0 refills | Status: DC | PRN

## 2020-11-12 NOTE — Progress Notes (Signed)
I,Tianna Badgett,acting as a Education administrator for [employer], NP.,have documented all relevant documentation on the behalf of [employer], NP,as directed by  Bary Castilla, NP while in the presence of Bary Castilla, NP.  This visit occurred during the SARS-CoV-2 public health emergency.  Safety protocols were in place, including screening questions prior to the visit, additional usage of staff PPE, and extensive cleaning of exam room while observing appropriate contact time as indicated for disinfecting solutions.  Subjective:     Patient ID: [name] , female    DOB: 1960-08-30 , 60 y.o.   MRN: [id number]   Chief Complaint  Patient presents with   Annual Exam    HPI  She is here today for a full physical examination. She is followed by GYN for her pelvic examinations.  She needs paperwork completed for work. She does have a cough which started this morning.  She had a stent placed. That was scheduled with the neurologist.  Diet and exercise.  Drink or smoke: occasionally  Mammogram this year.  She wants to wait for her shingrix shot. She also wants to wait for her pap smear to see a OBGYN.   Hypertension This is a chronic problem. The current episode started more than 1 year ago. The problem has been gradually improving since onset. The problem is controlled. Pertinent negatives include no blurred vision, chest pain, headaches, palpitations or shortness of breath. Risk factors for coronary artery disease include obesity, post-menopausal state, sedentary lifestyle and stress. Past treatments include angiotensin blockers, diuretics and beta blockers. The current treatment provides moderate improvement.    Past Medical History:  Diagnosis Date   Allergy    Arthritis    back    Atrial fibrillation ([place])    Atypical chest pain 06/30/2016   Back pain    DUE TO INJURY AT WORK ON05/2013   Bruises easily    Chronic lower back pain    Coronary artery disease     Depression    was on meds 2 yrs ago    Headache(784.0)    rarely   History of bronchitis    last time about 67yr ago    Hypertension    takes Benicar daily   Hypothyroidism 06/30/2016   patient denies; states "i have never been told i had thyroid problems" and does not take any medications for thyroid   OSA (obstructive sleep apnea) 04/28/2020   Preoperative evaluation of a medical condition to rule out surgical contraindications (TAR required) 09/12/2020   Pure hypercholesterolemia 05/31/2019   [place]    no residual   Vitamin D deficiency    takes Vitamin D 2 times/wk   Weakness    and numbness right foot     Family History  Problem Relation Age of Onset   Heart disease Mother    CAD Mother    Stroke Mother    Heart disease Sister    Heart attack Sister    Liver disease Brother    Other Father        unknown medical history   CAD Brother    Colon cancer Neg Hx    Esophageal cancer Neg Hx    Rectal cancer Neg Hx    Stomach cancer Neg Hx      Current Outpatient Medications:    benzonatate (TESSALON PERLES) 100 MG capsule, Take 1 capsule (100 mg total) by mouth 3 (three) times daily as needed for cough., Disp: 30 capsule, Rfl: 1   guaiFENesin-dextromethorphan (  ROBITUSSIN DM) 100-10 MG/5ML syrup, Take 5 mLs by mouth every 4 (four) hours as needed for cough., Disp: 118 mL, Rfl: 0   acetaminophen (TYLENOL) 500 MG tablet, Take 500-1,000 mg by mouth every 6 (six) hours as needed (pain or headaches)., Disp: , Rfl:    apixaban (ELIQUIS) 5 MG TABS tablet, Take 1 tablet (5 mg total) by mouth 2 (two) times daily., Disp: 180 tablet, Rfl: 1   atorvastatin (LIPITOR) 80 MG tablet, Take 1 tablet (80 mg total) by mouth daily at 6 PM., Disp: 90 tablet, Rfl: 2   cholecalciferol (VITAMIN D3) 25 MCG (1000 UNIT) tablet, Take 1,000 Units by mouth daily. , Disp: , Rfl:    diltiazem (CARDIZEM CD) 120 MG 24 hr capsule, TAKE 1 CAPSULE BY MOUTH EVERY DAY, Disp: 90 capsule, Rfl: 1   docusate  sodium (COLACE) 100 MG capsule, Take 1 capsule (100 mg total) by mouth daily as needed., Disp: 30 capsule, Rfl: 2   ezetimibe (ZETIA) 10 MG tablet, TAKE 1 TABLET BY MOUTH EVERY DAY, Disp: 90 tablet, Rfl: 3   fluticasone (FLONASE) 50 MCG/ACT nasal spray, PLACE 2 SPRAYS IN BOTH NOSTRILS DAILY AS NEEDED FOR ALLERGIES OR RHINITIS, Disp: 48 mL, Rfl: 2   furosemide (LASIX) 40 MG tablet, DAILY FOR 3 DAYS AND THEN AS DIRECTED, Disp: 20 tablet, Rfl: 1   hydrOXYzine (ATARAX/VISTARIL) 25 MG tablet, Take 1 tablet (25 mg total) by mouth 3 (three) times daily as needed., Disp: 30 tablet, Rfl: 0   levocetirizine (XYZAL) 5 MG tablet, TAKE 1 TABLET BY MOUTH EVERY DAY, Disp: 90 tablet, Rfl: 1   Magnesium 250 MG TABS, TAKE 1 TABLET BY MOUTH DAILY WITH EVENING MEALS, Disp: 90 tablet, Rfl: 2   methocarbamol (ROBAXIN) 500 MG tablet, Take 1 tablet (500 mg total) by mouth 2 (two) times daily as needed. To be taken after surgery, Disp: 20 tablet, Rfl: 2   mometasone (ELOCON) 0.1 % cream, APPLY TO AFFECTED AREA EVERY DAY, Disp: 45 g, Rfl: 1   Olmesartan-amLODIPine-HCTZ 20-5-12.5 MG TABS, TAKE 1 TABLET BY MOUTH EVERY DAY, Disp: 90 tablet, Rfl: 0   olmesartan-hydrochlorothiazide (BENICAR HCT) 40-25 MG tablet, TAKE 1 TABLET BY MOUTH EVERY DAY, Disp: 90 tablet, Rfl: 0   ondansetron (ZOFRAN) 4 MG tablet, Take 1 tablet (4 mg total) by mouth every 8 (eight) hours as needed for nausea or vomiting., Disp: 40 tablet, Rfl: 0   oxyCODONE-acetaminophen (PERCOCET) 5-325 MG tablet, Take 1-2 tablets by mouth every 6 (six) hours as needed. To be taken after surgery (Patient taking differently: Take 1-2 tablets by mouth every 6 (six) hours as needed. To be taken after surgery- hip), Disp: 40 tablet, Rfl: 0   ticagrelor (BRILINTA) 90 MG TABS tablet, Take 1 tablet (90 mg total) by mouth 2 (two) times daily., Disp: 180 tablet, Rfl: 0   topiramate (TOPAMAX) 100 MG tablet, Take 1 tablet (100 mg total) by mouth at bedtime., Disp: 90 tablet, Rfl: 4    Allergies  Allergen Reactions   Other Itching and Other (See Comments)    Patient experienced intense itching in her arm that was accessed for a past angiogram      The patient states she uses none for birth control. Last LMP was Patient's last menstrual period was 06/03/2011.. Negative for Dysmenorrhea. Negative for: breast discharge, breast lump(s), breast pain and breast self exam. Associated symptoms include abnormal vaginal bleeding. Pertinent negatives include abnormal bleeding (hematology), anxiety, decreased libido, depression, difficulty falling sleep, dyspareunia, history of infertility,  nocturia, sexual dysfunction, sleep disturbances, urinary incontinence, urinary urgency, vaginal discharge and vaginal itching. Diet regular.The patient states her exercise level is    . The patient's tobacco use is:  Social History   Tobacco Use  Smoking Status Never  Smokeless Tobacco Never  . She has been exposed to passive smoke. The patient's alcohol use is:  Social History   Substance and Sexual Activity  Alcohol Use Not Currently   Alcohol/week: 2.0 standard drinks   Types: 2 Glasses of wine per week   Comment: social- 2 wine or 2 beers  . Additional information: Last pap 2018, next one scheduled for 2021.    Review of Systems  Constitutional: Negative.   HENT:  Positive for congestion. Negative for rhinorrhea, sinus pressure and sore throat.   Eyes: Negative.  Negative for blurred vision.  Respiratory:  Positive for cough. Negative for shortness of breath and wheezing.   Cardiovascular: Negative.  Negative for chest pain and palpitations.  Gastrointestinal: Negative.  Negative for abdominal pain, constipation and diarrhea.  Endocrine: Negative.  Negative for polydipsia, polyphagia and polyuria.  Genitourinary: Negative.   Musculoskeletal: Negative.  Negative for arthralgias and myalgias.  Skin: Negative.   Allergic/Immunologic: Negative.   Neurological:  Positive for  dizziness. Negative for weakness and headaches.  Hematological: Negative.   Psychiatric/Behavioral: Negative.      Today's Vitals   11/12/20 1419  BP: 122/64  Pulse: 70  Temp: 99.3 F (37.4 C)  TempSrc: Oral  Weight: 196 lb 3.2 oz (89 kg)  Height: _0  (1.549 m)   Body mass index is 37.07 kg/m.  Wt Readings from Last 3 Encounters:  11/12/20 196 lb 3.2 oz (89 kg)  11/05/20 195 lb (88.5 kg)  10/17/20 198 lb 8 oz (90 kg)    Objective:  Physical Exam Vitals and nursing note reviewed.  Constitutional:      Appearance: Normal appearance.  HENT:     Head: Normocephalic and atraumatic.     Right Ear: Tympanic membrane, ear canal and external ear normal. There is no impacted cerumen.     Left Ear: Tympanic membrane, ear canal and external ear normal. There is no impacted cerumen.     Nose: Nose normal. No congestion.     Mouth/Throat:     Mouth: Mucous membranes are moist.     Pharynx: Oropharynx is clear.  Eyes:     Extraocular Movements: Extraocular movements intact.     Conjunctiva/sclera: Conjunctivae normal.     Pupils: Pupils are equal, round, and reactive to light.  Cardiovascular:     Rate and Rhythm: Normal rate and regular rhythm.     Pulses: Normal pulses.     Heart sounds: Normal heart sounds.  Pulmonary:     Effort: Pulmonary effort is normal. No respiratory distress.     Breath sounds: Normal breath sounds. No wheezing.  Chest:  Breasts:    Tanner Score is 5.     Right: Normal.     Left: Normal.  Abdominal:     General: Abdomen is flat. Bowel sounds are normal.     Palpations: Abdomen is soft.  Genitourinary:    Comments: deferred Musculoskeletal:        General: Normal range of motion.     Cervical back: Normal range of motion and neck supple.  Skin:    General: Skin is warm and dry.     Capillary Refill: Capillary refill takes less than 2 seconds.  Neurological:  General: No focal deficit present.     Mental Status: She is alert and oriented  to person, place, and time.  Psychiatric:        Mood and Affect: Mood normal.        Behavior: Behavior normal.        Assessment And Plan:     1. Routine general medical examination at health care facility --Patient is here for their annual physical exam and we discussed any changes to medication and medical history.  -Behavior modification was discussed as well as diet and exercise history  -Patient will continue to exercise regularly and modify their diet.  -Recommendation for yearly physical annuals, immunization and screenings including mammogram and colonoscopy were discussed with the patient.  -Recommended intake of multivitamin, vitamin D and calcium.  -Individualized advise was given to the patient pertaining to their own health history in regards to diet, exercise, medical condition and referrals.   2. Essential hypertension, benign -Chronic, stable, continue meds  - POCT Urinalysis Dipstick (81002) - POCT UA - Microalbumin - CBC - CMP14+EGFR  3. Acute cough - guaiFENesin-dextromethorphan (ROBITUSSIN DM) 100-10 MG/5ML syrup; Take 5 mLs by mouth every 4 (four) hours as needed for cough.  Dispense: 118 mL; Refill: 0 - benzonatate (TESSALON PERLES) 100 MG capsule; Take 1 capsule (100 mg total) by mouth 3 (three) times daily as needed for cough.  Dispense: 30 capsule; Refill: 1  4. Prediabetes -will check labs and assess  - Hemoglobin A1c  5. Pure hypercholesterolemia --Educated patient about a diet that is low in fat and high fatty foods including dairy products. Increase in take of fish and fiber. Decrease intake of red meats and fast foods. Exercise for atleast 4-5 times a week or atleast 30-45 min. Drink a lot of water.   - Lipid panel  6. Vitamin D deficiency -Will check and supplement if needed. Advised patient to spend atleast 15 min. Daily in sunlight.  - Vitamin D (25 hydroxy)  7. Post-menopausal - Ambulatory referral to Obstetrics / Gynecology  8.  Leukocytes in urine - Culture, Urine  9. Class 2 severe obesity due to excess calories with serious comorbidity and body mass index (BMI) of 37.0 to 37.9 in adult [facility]) -Advised patient on a healthy diet including avoiding fast food and red meats. Increase the intake of lean meats including grilled chicken and [place].  Drink a lot of water. Decrease intake of fatty foods. Exercise for 30-45 min. 4-5 a week to decrease the risk of cardiac event.   The patient was encouraged to call or send a message through [place] for any questions or concerns.   Follow up: if symptoms persist or do not get better.   Side effects and appropriate use of all the medication(s) were discussed with the patient today. Patient advised to use the medication(s) as directed by their healthcare provider. The patient was encouraged to read, review, and understand all associated package inserts and contact our office with any questions or concerns. The patient accepts the risks of the treatment plan and had an opportunity to ask questions.   Staying healthy and adopting a healthy lifestyle for your overall health is important. You should eat 7 or more servings of fruits and vegetables per day. You should drink plenty of water to keep yourself hydrated and your kidneys healthy. This includes about 65-80+ fluid ounces of water. Limit your intake of animal fats especially for elevated cholesterol. Avoid highly processed food and limit your salt intake  if you have hypertension. Avoid foods high in saturated/Trans fats. Along with a healthy diet it is also very important to maintain time for yourself to maintain a healthy mental health with low stress levels. You should get atleast 150 min of moderate intensity exercise weekly for a healthy heart. Along with eating right and exercising, aim for at least 7-9 hours of sleep daily.  Eat more whole grains which includes barley, wheat berries, oats, brown rice and whole wheat pasta. Use  healthy plant oils which include olive, soy, corn, sunflower and peanut. Limit your caffeine and sugary drinks. Limit your intake of fast foods. Limit milk and dairy products to one or two daily servings.   Patient was given opportunity to ask questions. Patient verbalized understanding of the plan and was able to repeat key elements of the plan. All questions were answered to their satisfaction.  Raman Cristi Gwynn, DNP   I, Raman Danaiya Steadman have reviewed all documentation for this visit. The documentation on 11/12/20 for the exam, diagnosis, procedures, and orders are all accurate and complete.    THE PATIENT IS ENCOURAGED TO PRACTICE SOCIAL DISTANCING DUE TO THE COVID-19 PANDEMIC.

## 2020-11-12 NOTE — Patient Instructions (Signed)
Health Maintenance, Female Adopting a healthy lifestyle and getting preventive care are important in promoting health and wellness. Ask your health care provider about: The right schedule for you to have regular tests and exams. Things you can do on your own to prevent diseases and keep yourself healthy. What should I know about diet, weight, and exercise? Eat a healthy diet  Eat a diet that includes plenty of vegetables, fruits, low-fat dairy products, and lean protein. Do not eat a lot of foods that are high in solid fats, added sugars, or sodium. Maintain a healthy weight Body mass index (BMI) is used to identify weight problems. It estimates body fat based on height and weight. Your health care provider can help determine your BMI and help you achieve or maintain a healthy weight. Get regular exercise Get regular exercise. This is one of the most important things you can do for your health. Most adults should: Exercise for at least 150 minutes each week. The exercise should increase your heart rate and make you sweat (moderate-intensity exercise). Do strengthening exercises at least twice a week. This is in addition to the moderate-intensity exercise. Spend less time sitting. Even light physical activity can be beneficial. Watch cholesterol and blood lipids Have your blood tested for lipids and cholesterol at 60 years of age, then have this test every 5 years. Have your cholesterol levels checked more often if: Your lipid or cholesterol levels are high. You are older than 60 years of age. You are at high risk for heart disease. What should I know about cancer screening? Depending on your health history and family history, you may need to have cancer screening at various ages. This may include screening for: Breast cancer. Cervical cancer. Colorectal cancer. Skin cancer. Lung cancer. What should I know about heart disease, diabetes, and high blood pressure? Blood pressure and heart  disease High blood pressure causes heart disease and increases the risk of stroke. This is more likely to develop in people who have high blood pressure readings, are of African descent, or are overweight. Have your blood pressure checked: Every 3-5 years if you are 18-39 years of age. Every year if you are 40 years old or older. Diabetes Have regular diabetes screenings. This checks your fasting blood sugar level. Have the screening done: Once every three years after age 40 if you are at a normal weight and have a low risk for diabetes. More often and at a younger age if you are overweight or have a high risk for diabetes. What should I know about preventing infection? Hepatitis B If you have a higher risk for hepatitis B, you should be screened for this virus. Talk with your health care provider to find out if you are at risk for hepatitis B infection. Hepatitis C Testing is recommended for: Everyone born from 1945 through 1965. Anyone with known risk factors for hepatitis C. Sexually transmitted infections (STIs) Get screened for STIs, including gonorrhea and chlamydia, if: You are sexually active and are younger than 60 years of age. You are older than 60 years of age and your health care provider tells you that you are at risk for this type of infection. Your sexual activity has changed since you were last screened, and you are at increased risk for chlamydia or gonorrhea. Ask your health care provider if you are at risk. Ask your health care provider about whether you are at high risk for HIV. Your health care provider may recommend a prescription medicine   to help prevent HIV infection. If you choose to take medicine to prevent HIV, you should first get tested for HIV. You should then be tested every 3 months for as long as you are taking the medicine. Pregnancy If you are about to stop having your period (premenopausal) and you may become pregnant, seek counseling before you get  pregnant. Take 400 to 800 micrograms (mcg) of folic acid every day if you become pregnant. Ask for birth control (contraception) if you want to prevent pregnancy. Osteoporosis and menopause Osteoporosis is a disease in which the bones lose minerals and strength with aging. This can result in bone fractures. If you are 65 years old or older, or if you are at risk for osteoporosis and fractures, ask your health care provider if you should: Be screened for bone loss. Take a calcium or vitamin D supplement to lower your risk of fractures. Be given hormone replacement therapy (HRT) to treat symptoms of menopause. Follow these instructions at home: Lifestyle Do not use any products that contain nicotine or tobacco, such as cigarettes, e-cigarettes, and chewing tobacco. If you need help quitting, ask your health care provider. Do not use street drugs. Do not share needles. Ask your health care provider for help if you need support or information about quitting drugs. Alcohol use Do not drink alcohol if: Your health care provider tells you not to drink. You are pregnant, may be pregnant, or are planning to become pregnant. If you drink alcohol: Limit how much you use to 0-1 drink a day. Limit intake if you are breastfeeding. Be aware of how much alcohol is in your drink. In the [place] one drink equals one 12 oz bottle of beer (355 mL), one 5 oz glass of wine (148 mL), or one 1 oz glass of hard liquor (44 mL). General instructions Schedule regular health, dental, and eye exams. Stay current with your vaccines. Tell your health care provider if: You often feel depressed. You have ever been abused or do not feel safe at home. Summary Adopting a healthy lifestyle and getting preventive care are important in promoting health and wellness. Follow your health care provider's instructions about healthy diet, exercising, and getting tested or screened for diseases. Follow your health care provider's  instructions on monitoring your cholesterol and blood pressure. This information is not intended to replace advice given to you by your health care provider. Make sure you discuss any questions you have with your health care provider. Document Revised: 03/07/2020 Document Reviewed: 12/21/2017 Elsevier Patient Education  2022 Elsevier Inc.  

## 2020-11-13 ENCOUNTER — Other Ambulatory Visit (HOSPITAL_COMMUNITY): Payer: Self-pay

## 2020-11-13 LAB — HEMOGLOBIN A1C
Est. average glucose Bld gHb Est-mCnc: 134 mg/dL
Hgb A1c MFr Bld: 6.3 % — ABNORMAL HIGH (ref 4.8–5.6)

## 2020-11-13 LAB — CBC
Hematocrit: 31.3 % — ABNORMAL LOW (ref 34.0–46.6)
Hemoglobin: 10.4 g/dL — ABNORMAL LOW (ref 11.1–15.9)
MCH: 28.3 pg (ref 26.6–33.0)
MCHC: 33.2 g/dL (ref 31.5–35.7)
MCV: 85 fL (ref 79–97)
Platelets: 328 10*3/uL (ref 150–450)
RBC: 3.67 x10E6/uL — ABNORMAL LOW (ref 3.77–5.28)
RDW: 13.2 % (ref 11.7–15.4)
WBC: 6.6 10*3/uL (ref 3.4–10.8)

## 2020-11-13 LAB — LIPID PANEL
Chol/HDL Ratio: 2.9 ratio (ref 0.0–4.4)
Cholesterol, Total: 99 mg/dL — ABNORMAL LOW (ref 100–199)
HDL: 34 mg/dL — ABNORMAL LOW (ref >39)
LDL Chol Calc (NIH): 52 mg/dL (ref 0–99)
Triglycerides: 56 mg/dL (ref 0–149)
VLDL Cholesterol Cal: 13 mg/dL (ref 5–40)

## 2020-11-13 LAB — CMP14+EGFR
ALT: 20 IU/L (ref 0–32)
AST: 16 IU/L (ref 0–40)
Albumin/Globulin Ratio: 1.8 (ref 1.2–2.2)
Albumin: 4.7 g/dL (ref 3.8–4.9)
Alkaline Phosphatase: 83 IU/L (ref 44–121)
BUN/Creatinine Ratio: 11 — ABNORMAL LOW (ref 12–28)
BUN: 11 mg/dL (ref 8–27)
Bilirubin Total: 0.6 mg/dL (ref 0.0–1.2)
CO2: 21 mmol/L (ref 20–29)
Calcium: 9.9 mg/dL (ref 8.7–10.3)
Chloride: 109 mmol/L — ABNORMAL HIGH (ref 96–106)
Creatinine, Ser: 1.01 mg/dL — ABNORMAL HIGH (ref 0.57–1.00)
Globulin, Total: 2.6 g/dL (ref 1.5–4.5)
Glucose: 96 mg/dL (ref 70–99)
Potassium: 4.2 mmol/L (ref 3.5–5.2)
Sodium: 145 mmol/L — ABNORMAL HIGH (ref 134–144)
Total Protein: 7.3 g/dL (ref 6.0–8.5)
eGFR: 64 mL/min/{1.73_m2} (ref >59)

## 2020-11-13 LAB — VITAMIN D 25 HYDROXY (VIT D DEFICIENCY, FRACTURES): Vit D, 25-Hydroxy: 46.3 ng/mL (ref 30.0–100.0)

## 2020-11-14 ENCOUNTER — Other Ambulatory Visit (HOSPITAL_COMMUNITY): Payer: Self-pay

## 2020-11-14 ENCOUNTER — Telehealth (HOSPITAL_COMMUNITY): Payer: Self-pay | Admitting: Pharmacist

## 2020-11-14 ENCOUNTER — Other Ambulatory Visit: Payer: Self-pay | Admitting: Cardiovascular Disease

## 2020-11-14 NOTE — Telephone Encounter (Signed)
Refill ordered

## 2020-11-14 NOTE — Telephone Encounter (Signed)
Transitions of Care Pharmacy  ° °Call attempted for a pharmacy transitions of care follow-up. HIPAA appropriate voicemail was left with call back information provided.  ° °Call attempt #1. Will follow-up in 2-3 days.  °  °

## 2020-11-16 LAB — URINE CULTURE

## 2020-11-17 ENCOUNTER — Other Ambulatory Visit (HOSPITAL_COMMUNITY): Payer: Self-pay

## 2020-11-17 ENCOUNTER — Telehealth (HOSPITAL_COMMUNITY): Payer: Self-pay | Admitting: Pharmacist

## 2020-11-17 NOTE — Telephone Encounter (Signed)
Pharmacy Transitions of Care Follow-up Telephone Call  Date of discharge: 11/05/20  Discharge Diagnosis: Left ICA stent placement and a.fib  How have you been since you were released from the hospital?  fine  Medication changes made at discharge:  - START: Brilinta **pt on Apixaban PTA  - STOP: ASA    Medication changes verified by the patient? yes    Medication Accessibility:  [facility] [place]   Was the patient provided with refills on discharged medications? y   Have all prescriptions been transferred from [facility] to home pharmacy?  Y  Is the patient able to afford medications? Yes Notable copays: $0/30 day supply  Notified pt of $5 Brilinta coupon if copay increases after Jan 1.    Medication Review:  TICAGRELOR (BRILINTA) Ticagrelor 90 mg BID initiated on 11/06/20.  - Educated patient on expected duration of therapy with ticagrelor.  - Discussed importance of taking medication around the same time every day, - Reviewed potential DDIs with patient - Advised patient of medications to avoid (NSAIDs, aspirin maintenance doses>100 mg daily) - Educated that Tylenol (acetaminophen) will be the preferred analgesic to prevent risk of bleeding  - Emphasized importance of monitoring for signs and symptoms of bleeding (abnormal bruising, prolonged bleeding, nose bleeds, bleeding from gums, discolored urine, black tarry stools)  - Educated patient to notify doctor if shortness of breath or abnormal heartbeat occur - Advised patient to alert all providers of antiplatelet therapy prior to starting a new medication or having a procedure    Follow-up Appointments:  PCP Hospital f/u appt confirmed?  Scheduled to see Dorothyann Peng on 02/18/2021 @ 4pm.   Specialist [facility] f/u appt confirmed? Recommended for pt to contact cardiology to schedule appt hosp follow-up  If their condition worsens, is the pt aware to call PCP or go to the Emergency Dept.? yes  Final  Patient Assessment:  Pt is doing well overall and taking meds appropriately.  No bleeding issues.  Pt concerned about medication cost.  Reviewed copays and options for savings.

## 2020-11-18 ENCOUNTER — Other Ambulatory Visit (HOSPITAL_COMMUNITY): Payer: Self-pay

## 2020-11-18 NOTE — Progress Notes (Signed)
Your urine culture was neg. Does she want to start the rybellsus? Let me know so she can come in to pick up the sample and also go over the direction with her and SE. And also so I can send it to her pharmacy. Make sure she does not have any history or family history of thyroid cancer/pancreatitis.

## 2020-11-20 ENCOUNTER — Other Ambulatory Visit: Payer: Self-pay

## 2020-11-20 ENCOUNTER — Ambulatory Visit (HOSPITAL_COMMUNITY)
Admission: RE | Admit: 2020-11-20 | Discharge: 2020-11-20 | Disposition: A | Discharge status: Home / Self Care | Payer: 59 | Source: Ambulatory Visit | Attending: Student | Admitting: Student

## 2020-11-20 DIAGNOSIS — I6522 Occlusion and stenosis of left carotid artery: Secondary | ICD-10-CM

## 2020-11-21 HISTORY — PX: IR RADIOLOGIST EVAL & MGMT: IMG5224

## 2020-11-26 ENCOUNTER — Other Ambulatory Visit: Payer: Self-pay | Admitting: Internal Medicine

## 2020-11-26 DIAGNOSIS — Z1231 Encounter for screening mammogram for malignant neoplasm of breast: Secondary | ICD-10-CM

## 2020-12-19 ENCOUNTER — Other Ambulatory Visit: Payer: Self-pay | Admitting: Nurse Practitioner

## 2020-12-30 ENCOUNTER — Ambulatory Visit: Payer: 59

## 2020-12-30 ENCOUNTER — Ambulatory Visit: Payer: 59 | Admitting: Obstetrics and Gynecology

## 2021-01-01 ENCOUNTER — Other Ambulatory Visit: Payer: Self-pay | Admitting: Internal Medicine

## 2021-01-01 ENCOUNTER — Other Ambulatory Visit: Payer: Self-pay | Admitting: Cardiovascular Disease

## 2021-01-01 DIAGNOSIS — I10 Essential (primary) hypertension: Secondary | ICD-10-CM

## 2021-01-06 ENCOUNTER — Other Ambulatory Visit: Payer: Self-pay | Admitting: Physician Assistant

## 2021-01-06 ENCOUNTER — Telehealth: Payer: Self-pay | Admitting: Orthopaedic Surgery

## 2021-01-06 MED ORDER — HYDROCODONE-ACETAMINOPHEN 5-325 MG PO TABS: 1.0000 | ORAL_TABLET | Freq: Two times a day (BID) | ORAL | 0 refills | Status: DC | PRN

## 2021-01-06 NOTE — Telephone Encounter (Signed)
I called patient and advised. 

## 2021-01-06 NOTE — Telephone Encounter (Signed)
Pt called stating her hip has been in a lot of pain lately and she would like to know if she could have something called in to help? [facility] [address] if something can be sent in and she would like a CB to update her on what is sent in.   [phone]

## 2021-01-06 NOTE — Telephone Encounter (Signed)
Sent in norco to take sparingly

## 2021-01-20 ENCOUNTER — Other Ambulatory Visit: Payer: Self-pay | Admitting: Physician Assistant

## 2021-01-27 ENCOUNTER — Ambulatory Visit (INDEPENDENT_AMBULATORY_CARE_PROVIDER_SITE_OTHER): Payer: 59 | Admitting: Obstetrics

## 2021-01-27 ENCOUNTER — Other Ambulatory Visit: Payer: Self-pay

## 2021-01-27 ENCOUNTER — Encounter: Payer: Self-pay | Admitting: Obstetrics

## 2021-01-27 ENCOUNTER — Other Ambulatory Visit (HOSPITAL_COMMUNITY)
Admission: RE | Admit: 2021-01-27 | Discharge: 2021-01-27 | Disposition: A | Discharge status: Home / Self Care | Payer: 59 | Source: Ambulatory Visit | Attending: Obstetrics and Gynecology | Admitting: Obstetrics and Gynecology

## 2021-01-27 VITALS — BP 135/73 | HR 83 | Ht 61.0 in | Wt 196.0 lb

## 2021-01-27 DIAGNOSIS — Z1239 Encounter for other screening for malignant neoplasm of breast: Secondary | ICD-10-CM | POA: Diagnosis not present

## 2021-01-27 DIAGNOSIS — N898 Other specified noninflammatory disorders of vagina: Secondary | ICD-10-CM | POA: Insufficient documentation

## 2021-01-27 DIAGNOSIS — Z01419 Encounter for gynecological examination (general) (routine) without abnormal findings: Secondary | ICD-10-CM | POA: Insufficient documentation

## 2021-01-27 DIAGNOSIS — E2839 Other primary ovarian failure: Secondary | ICD-10-CM | POA: Diagnosis not present

## 2021-01-27 NOTE — Progress Notes (Signed)
Subjective:        [name] is a 61 y.o. female here for a routine exam.  Current complaints: Vaginal discharge.    Personal health questionnaire:  Is patient Ashkenazi Jewish, have a family history of breast and/or ovarian cancer: no Is there a family history of uterine cancer diagnosed at age < 60, gastrointestinal cancer, urinary tract cancer, family member who is a Field seismologist syndrome-associated carrier: no Is the patient overweight and hypertensive, family history of diabetes, personal history of gestational diabetes, preeclampsia or PCOS: no Is patient over 64, have PCOS,  family history of premature CHD under age 43, diabetes, smoke, have hypertension or peripheral artery disease:  no At any time, has a partner hit, kicked or otherwise hurt or frightened you?: no Over the past 2 weeks, have you felt down, depressed or hopeless?: no Over the past 2 weeks, have you felt little interest or pleasure in doing things?:no   Gynecologic History Patient's last menstrual period was 06/03/2011. Contraception: post menopausal status Last Pap: unknown. Results were: normal Last mammogram: 2 years ago. Results were: normal  Obstetric History OB History  No obstetric history on file.    Past Medical History:  Diagnosis Date   Allergy    Arthritis    back    Atrial fibrillation ([place])    Atypical chest pain 06/30/2016   Back pain    DUE TO INJURY AT WORK ON05/2013   Bruises easily    Chronic lower back pain    Coronary artery disease    Depression    was on meds 2 yrs ago    Headache(784.0)    rarely   History of bronchitis    last time about 61yrs ago    Hypertension    takes Benicar daily   Hypothyroidism 06/30/2016   patient denies; states "i have never been told i had thyroid problems" and does not take any medications for thyroid   OSA (obstructive sleep apnea) 04/28/2020   Preoperative evaluation of a medical condition to rule out surgical contraindications (TAR  required) 09/12/2020   Pure hypercholesterolemia 05/31/2019   Stroke ([place])    no residual   Vitamin D deficiency    takes Vitamin D 2 times/wk   Weakness    and numbness right foot    Past Surgical History:  Procedure Laterality Date   BACK SURGERY     CARPAL TUNNEL RELEASE Right    CESAREAN [place]   "took out fatty tumor" (11/09/2012)   IR ANGIO INTRA EXTRACRAN SEL COM CAROTID INNOMINATE BILAT MOD SED  08/10/2019   IR ANGIO INTRA EXTRACRAN SEL COM CAROTID INNOMINATE BILAT MOD SED  10/07/2020   IR ANGIO INTRA EXTRACRAN SEL COM CAROTID INNOMINATE UNI L MOD SED  11/05/2020   IR ANGIO VERTEBRAL SEL SUBCLAVIAN INNOMINATE BILAT MOD SED  10/07/2020   IR ANGIO VERTEBRAL SEL VERTEBRAL BILAT MOD SED  08/10/2019   IR CT HEAD LTD  11/05/2020   IR INTRAVSC STENT CERV CAROTID W/EMB-PROT MOD SED INCL ANGIO  11/05/2020   IR RADIOLOGIST EVAL & MGMT  11/21/2020   IR US GUIDE VASC ACCESS RIGHT  08/10/2019   IR US GUIDE VASC ACCESS RIGHT  10/07/2020   LEFT HEART CATH AND CORONARY ANGIOGRAPHY N/A 08/06/2016   Procedure: Left Heart Cath and Coronary Angiography;  Surgeon: Belva Crome, MD;  Location: [place] [facility];  Service: Cardiovascular;  Laterality:  N/A;   LEFT HEART CATH AND CORONARY ANGIOGRAPHY N/A 07/12/2018   Procedure: LEFT HEART CATH AND CORONARY ANGIOGRAPHY;  Surgeon: Leonie Man, MD;  Location: [place] [facility];  Service: Cardiovascular;  Laterality: N/A;   LUMBAR LAMINECTOMY/DECOMPRESSION MICRODISCECTOMY  11/09/2012   "L3-4" (11/09/2012)   LUMBAR LAMINECTOMY/DECOMPRESSION MICRODISCECTOMY N/A 11/09/2012   Procedure: L3-L4 DECOMPRESSION AND MICRODISCECTOMY   (1 LEVEL);  Surgeon: Melina Schools, MD;  Location: [place];  Service: Orthopedics;  Laterality: N/A;   RADIOLOGY WITH ANESTHESIA N/A 11/05/2020   Procedure: STENTING;  Surgeon: Luanne Bras, MD;  Location: [place];  Service: Radiology;  Laterality: N/A;     Current  Outpatient Medications:    furosemide (LASIX) 40 MG tablet, TAKE 1 TABLET DAILY FOR 3 DAYS AND THEN AS DIRECTED, Disp: 90 tablet, Rfl: 3   HYDROcodone-acetaminophen (NORCO) 5-325 MG tablet, Take 1 tablet by mouth 2 (two) times daily as needed., Disp: 20 tablet, Rfl: 0   acetaminophen (TYLENOL) 500 MG tablet, Take 500-1,000 mg by mouth every 6 (six) hours as needed (pain or headaches)., Disp: , Rfl:    apixaban (ELIQUIS) 5 MG TABS tablet, Take 1 tablet (5 mg total) by mouth 2 (two) times daily., Disp: 180 tablet, Rfl: 1   atorvastatin (LIPITOR) 80 MG tablet, Take 1 tablet (80 mg total) by mouth daily at 6 PM., Disp: 90 tablet, Rfl: 2   benzonatate (TESSALON PERLES) 100 MG capsule, Take 1 capsule (100 mg total) by mouth 3 (three) times daily as needed for cough., Disp: 30 capsule, Rfl: 1   cholecalciferol (VITAMIN D3) 25 MCG (1000 UNIT) tablet, Take 1,000 Units by mouth daily. , Disp: , Rfl:    diltiazem (CARDIZEM CD) 120 MG 24 hr capsule, TAKE 1 CAPSULE BY MOUTH EVERY DAY, Disp: 90 capsule, Rfl: 1   docusate sodium (COLACE) 100 MG capsule, Take 1 capsule (100 mg total) by mouth daily as needed., Disp: 30 capsule, Rfl: 2   ezetimibe (ZETIA) 10 MG tablet, TAKE 1 TABLET BY MOUTH EVERY DAY, Disp: 90 tablet, Rfl: 3   fluticasone (FLONASE) 50 MCG/ACT nasal spray, PLACE 2 SPRAYS IN BOTH NOSTRILS DAILY AS NEEDED FOR ALLERGIES OR RHINITIS, Disp: 48 mL, Rfl: 2   guaiFENesin-dextromethorphan (ROBITUSSIN DM) 100-10 MG/5ML syrup, Take 5 mLs by mouth every 4 (four) hours as needed for cough., Disp: 118 mL, Rfl: 0   hydrOXYzine (ATARAX/VISTARIL) 25 MG tablet, Take 1 tablet (25 mg total) by mouth 3 (three) times daily as needed., Disp: 30 tablet, Rfl: 0   levocetirizine (XYZAL) 5 MG tablet, TAKE 1 TABLET BY MOUTH EVERY DAY, Disp: 90 tablet, Rfl: 1   Magnesium 250 MG TABS, TAKE 1 TABLET BY MOUTH DAILY WITH EVENING MEALS, Disp: 90 tablet, Rfl: 2   methocarbamol (ROBAXIN) 500 MG tablet, Take 1 tablet (500 mg total) by  mouth 2 (two) times daily as needed. To be taken after surgery, Disp: 20 tablet, Rfl: 2   mometasone (ELOCON) 0.1 % cream, APPLY TO AFFECTED AREA EVERY DAY, Disp: 45 g, Rfl: 1   Olmesartan-amLODIPine-HCTZ 20-5-12.5 MG TABS, TAKE 1 TABLET BY MOUTH EVERY DAY, Disp: 30 tablet, Rfl: 2   olmesartan-hydrochlorothiazide (BENICAR HCT) 40-25 MG tablet, TAKE 1 TABLET BY MOUTH EVERY DAY, Disp: 90 tablet, Rfl: 0   ondansetron (ZOFRAN) 4 MG tablet, Take 1 tablet (4 mg total) by mouth every 8 (eight) hours as needed for nausea or vomiting., Disp: 40 tablet, Rfl: 0   oxyCODONE-acetaminophen (PERCOCET) 5-325 MG tablet, Take 1-2 tablets by mouth every  6 (six) hours as needed. To be taken after surgery (Patient taking differently: Take 1-2 tablets by mouth every 6 (six) hours as needed. To be taken after surgery- hip), Disp: 40 tablet, Rfl: 0   ticagrelor (BRILINTA) 90 MG TABS tablet, Take 1 tablet (90 mg total) by mouth 2 (two) times daily., Disp: 180 tablet, Rfl: 0   topiramate (TOPAMAX) 100 MG tablet, Take 1 tablet (100 mg total) by mouth at bedtime., Disp: 90 tablet, Rfl: 4 Allergies  Allergen Reactions   Other Itching and Other (See Comments)    Patient experienced intense itching in her arm that was accessed for a past angiogram    Social History   Tobacco Use   Smoking status: Never   Smokeless tobacco: Never  Substance Use Topics   Alcohol use: Not Currently    Alcohol/week: 2.0 standard drinks    Types: 2 Glasses of wine per week    Comment: social- 2 wine or 2 beers    Family History  Problem Relation Age of Onset   Heart disease Mother    CAD Mother    Stroke Mother    Heart disease Sister    Heart attack Sister    Liver disease Brother    Other Father        unknown medical history   CAD Brother    Colon cancer Neg Hx    Esophageal cancer Neg Hx    Rectal cancer Neg Hx    Stomach cancer Neg Hx       Review of Systems  Constitutional: negative for fatigue and weight  loss Respiratory: negative for cough and wheezing Cardiovascular: negative for chest pain, fatigue and palpitations Gastrointestinal: negative for abdominal pain and change in bowel habits Musculoskeletal:negative for myalgias Neurological: negative for gait problems and tremors Behavioral/Psych: negative for abusive relationship, depression Endocrine: negative for temperature intolerance    Genitourinary:negative for abnormal menstrual periods, genital lesions, hot flashes, sexual problems and vaginal discharge Integument/breast: negative for breast lump, breast tenderness, nipple discharge and skin lesion(s)    Objective:       BP 135/73    Pulse 83    Ht 5\' 1"  (1.549 m)    Wt 196 lb (88.9 kg)    LMP 06/03/2011    BMI 37.03 kg/m  General:   Alert and no distress  Skin:   no rash or abnormalities  Lungs:   clear to auscultation bilaterally  Heart:   regular rate and rhythm, S1, S2 normal, no murmur, click, rub or gallop  Breasts:   normal without suspicious masses, skin or nipple changes or axillary nodes  Abdomen:  normal findings: no organomegaly, soft, non-tender and no hernia  Pelvis:  External genitalia: normal general appearance Urinary system: urethral meatus normal and bladder without fullness, nontender Vaginal: normal without tenderness, induration or masses Cervix: normal appearance Adnexa: normal bimanual exam Uterus: anteverted and non-tender, normal size   Lab Review Urine pregnancy test Labs reviewed yes Radiologic studies reviewed yes  I have spent a total of 20 minutes of face-to-face time, excluding clinical staff time, reviewing notes and preparing to see patient, ordering tests and/or medications, and counseling the patient.   Assessment:     1. Encounter for routine gynecological examination with Papanicolaou smear of cervix Rx: - Cytology - PAP( [place])  2. Vaginal discharge Rx: - Cervicovaginal ancillary only( [place])  3. Screening  breast examination Rx: - MM Digital Screening; Future  4. Hypoestrogenism Rx: - DG BONE  DENSITY (DXA); Future    Plan:      Education reviewed: calcium supplements, depression evaluation, low fat, low cholesterol diet, safe sex/STD prevention, self breast exams, and weight bearing exercise. Mammogram ordered. Follow up in: 1 year. Bone Density Study ordered  Orders Placed This Encounter  Procedures   MM Digital Screening    Standing Status:   Future    Standing Expiration Date:   01/27/2022    Order Specific Question:   Reason for Exam (SYMPTOM  OR DIAGNOSIS REQUIRED)    Answer:   Screening    Order Specific Question:   Is the patient pregnant?    Answer:   No    Order Specific Question:   Preferred imaging location?    Answer:   [facility]   DG BONE DENSITY (DXA)    Standing Status:   Future    Standing Expiration Date:   01/27/2022    Order Specific Question:   Reason for Exam (SYMPTOM  OR DIAGNOSIS REQUIRED)    Answer:   Screening    Order Specific Question:   Is the patient pregnant?    Answer:   No    Order Specific Question:   Preferred imaging location?    Answer:   [facility]     Shelly Bombard, MD 01/27/2021 9:04 AM

## 2021-01-27 NOTE — Progress Notes (Signed)
Pt states she does have an increase discharge.

## 2021-01-28 LAB — CERVICOVAGINAL ANCILLARY ONLY
Bacterial Vaginitis (gardnerella): POSITIVE — AB
Candida Glabrata: NEGATIVE
Candida Vaginitis: NEGATIVE
Chlamydia: NEGATIVE
Comment: NEGATIVE
Comment: NEGATIVE
Comment: NEGATIVE
Comment: NEGATIVE
Comment: NEGATIVE
Comment: NORMAL
Neisseria Gonorrhea: NEGATIVE
Trichomonas: NEGATIVE

## 2021-01-29 ENCOUNTER — Other Ambulatory Visit: Payer: Self-pay | Admitting: Obstetrics

## 2021-01-29 DIAGNOSIS — B9689 Other specified bacterial agents as the cause of diseases classified elsewhere: Secondary | ICD-10-CM

## 2021-01-29 MED ORDER — METRONIDAZOLE 500 MG PO TABS: 500.0000 mg | ORAL_TABLET | Freq: Two times a day (BID) | ORAL | 2 refills | Status: DC

## 2021-02-02 ENCOUNTER — Telehealth: Payer: Self-pay | Admitting: Cardiovascular Disease

## 2021-02-02 ENCOUNTER — Other Ambulatory Visit (HOSPITAL_COMMUNITY): Payer: Self-pay

## 2021-02-02 LAB — CYTOLOGY - PAP
Comment: NEGATIVE
Diagnosis: UNDETERMINED — AB
High risk HPV: NEGATIVE

## 2021-02-02 NOTE — Telephone Encounter (Signed)
°*  STAT* If patient is at the pharmacy, call can be transferred to refill team.   1. Which medications need to be refilled? (please list name of each medication and dose if known) ticagrelor (BRILINTA) 90 MG TABS tablet    2. Which pharmacy/location (including street and city if local pharmacy) is medication to be sent to?[id number] [place]  - [address]  Phone:  [phone] Fax:  [phone]  3. Do they need a 30 day or 90 day supply? 90 ds

## 2021-02-02 NOTE — Telephone Encounter (Signed)
Overdue for follow up with Dr. Cena Benton who placed her carotid stent. Looks like she also called regarding refills.  Would recommend discussing with their office or primary care how long they want her to remain on Brilinta after carotid stenting. Okay to mail her a coupon so she has available should she need to continue.   Alver Sorrow, NP

## 2021-02-02 NOTE — Telephone Encounter (Signed)
Pt would like to know if there are any coupons for Brilinta available... please advise

## 2021-02-02 NOTE — Telephone Encounter (Signed)
See mychart message to patient.

## 2021-02-03 NOTE — Progress Notes (Signed)
TC to patient to notify of need for Pap repeat in 1 year. Verbalized understanding. Patient verbalized concerns regarding BV. Education and reassurance provided. Verbalized understanding.

## 2021-02-18 ENCOUNTER — Ambulatory Visit: Payer: 59 | Admitting: Internal Medicine

## 2021-02-18 ENCOUNTER — Other Ambulatory Visit: Payer: Self-pay

## 2021-02-18 ENCOUNTER — Encounter: Payer: Self-pay | Admitting: Internal Medicine

## 2021-02-18 VITALS — BP 122/72 | HR 78 | Temp 97.9°F | Ht 60.4 in | Wt 194.4 lb

## 2021-02-18 DIAGNOSIS — R7309 Other abnormal glucose: Secondary | ICD-10-CM

## 2021-02-18 DIAGNOSIS — I10 Essential (primary) hypertension: Secondary | ICD-10-CM

## 2021-02-18 DIAGNOSIS — Z6837 Body mass index (BMI) 37.0-37.9, adult: Secondary | ICD-10-CM

## 2021-02-18 DIAGNOSIS — I771 Stricture of artery: Secondary | ICD-10-CM | POA: Insufficient documentation

## 2021-02-18 DIAGNOSIS — I270 Primary pulmonary hypertension: Secondary | ICD-10-CM

## 2021-02-18 DIAGNOSIS — I480 Paroxysmal atrial fibrillation: Secondary | ICD-10-CM | POA: Diagnosis not present

## 2021-02-18 NOTE — Patient Instructions (Signed)

## 2021-02-18 NOTE — Progress Notes (Signed)
Rich Brave [place]leton,acting as a Education administrator for Maximino Greenland, MD.,have documented all relevant documentation on the behalf of Maximino Greenland, MD,as directed by  Maximino Greenland, MD while in the presence of Maximino Greenland, MD.  This visit occurred during the SARS-CoV-2 public health emergency.  Safety protocols were in place, including screening questions prior to the visit, additional usage of staff PPE, and extensive cleaning of exam room while observing appropriate contact time as indicated for disinfecting solutions.  Subjective:     Patient ID: [name] , female    DOB: Mar 21, 1960 , 61 y.o.   MRN: [id number]   Chief Complaint  Patient presents with   Hypertension    HPI  Patient presents today for a blood pressure f/u. She reports compliance with her meds. She declined to get singles vaccine due to her birthday being [birth year].   Hypertension This is a chronic problem. The current episode started more than 1 year ago. The problem has been gradually improving since onset. The problem is controlled. Pertinent negatives include no blurred vision, palpitations or shortness of breath. The current treatment provides moderate improvement. Compliance problems include exercise.     Past Medical History:  Diagnosis Date   Allergy    Arthritis    back    Atrial fibrillation ([place])    Atypical chest pain 06/30/2016   Back pain    DUE TO INJURY AT WORK ON05/2013   Bruises easily    Chronic lower back pain    Coronary artery disease    Depression    was on meds 2 yrs ago    Headache(784.0)    rarely   History of bronchitis    last time about 76yr ago    Hypertension    takes Benicar daily   Hypothyroidism 06/30/2016   patient denies; states "i have never been told i had thyroid problems" and does not take any medications for thyroid   OSA (obstructive sleep apnea) 04/28/2020   Preoperative evaluation of a medical condition to rule out surgical contraindications (TAR required)  09/12/2020   Pure hypercholesterolemia 05/31/2019   [place]    no residual   Vitamin D deficiency    takes Vitamin D 2 times/wk   Weakness    and numbness right foot     Family History  Problem Relation Age of Onset   Heart disease Mother    CAD Mother    Stroke Mother    Heart disease Sister    Heart attack Sister    Liver disease Brother    Other Father        unknown medical history   CAD Brother    Colon cancer Neg Hx    Esophageal cancer Neg Hx    Rectal cancer Neg Hx    Stomach cancer Neg Hx      Current Outpatient Medications:    acetaminophen (TYLENOL) 500 MG tablet, Take 500-1,000 mg by mouth every 6 (six) hours as needed (pain or headaches)., Disp: , Rfl:    apixaban (ELIQUIS) 5 MG TABS tablet, Take 1 tablet (5 mg total) by mouth 2 (two) times daily., Disp: 180 tablet, Rfl: 1   atorvastatin (LIPITOR) 80 MG tablet, Take 1 tablet (80 mg total) by mouth daily at 6 PM., Disp: 90 tablet, Rfl: 2   benzonatate (TESSALON PERLES) 100 MG capsule, Take 1 capsule (100 mg total) by mouth 3 (three) times daily as needed for cough., Disp: 30 capsule, Rfl: 1  cholecalciferol (VITAMIN D3) 25 MCG (1000 UNIT) tablet, Take 1,000 Units by mouth daily. , Disp: , Rfl:    diltiazem (CARDIZEM CD) 120 MG 24 hr capsule, TAKE 1 CAPSULE BY MOUTH EVERY DAY, Disp: 90 capsule, Rfl: 1   ezetimibe (ZETIA) 10 MG tablet, TAKE 1 TABLET BY MOUTH EVERY DAY, Disp: 90 tablet, Rfl: 3   fluticasone (FLONASE) 50 MCG/ACT nasal spray, PLACE 2 SPRAYS IN BOTH NOSTRILS DAILY AS NEEDED FOR ALLERGIES OR RHINITIS, Disp: 48 mL, Rfl: 2   furosemide (LASIX) 40 MG tablet, TAKE 1 TABLET DAILY FOR 3 DAYS AND THEN AS DIRECTED, Disp: 90 tablet, Rfl: 3   HYDROcodone-acetaminophen (NORCO) 5-325 MG tablet, Take 1 tablet by mouth 2 (two) times daily as needed., Disp: 20 tablet, Rfl: 0   hydrOXYzine (ATARAX/VISTARIL) 25 MG tablet, Take 1 tablet (25 mg total) by mouth 3 (three) times daily as needed., Disp: 30 tablet, Rfl: 0    levocetirizine (XYZAL) 5 MG tablet, TAKE 1 TABLET BY MOUTH EVERY DAY, Disp: 90 tablet, Rfl: 1   Magnesium 250 MG TABS, TAKE 1 TABLET BY MOUTH DAILY WITH EVENING MEALS, Disp: 90 tablet, Rfl: 2   mometasone (ELOCON) 0.1 % cream, APPLY TO AFFECTED AREA EVERY DAY, Disp: 45 g, Rfl: 1   Olmesartan-amLODIPine-HCTZ 20-5-12.5 MG TABS, TAKE 1 TABLET BY MOUTH EVERY DAY, Disp: 30 tablet, Rfl: 2   ondansetron (ZOFRAN) 4 MG tablet, Take 1 tablet (4 mg total) by mouth every 8 (eight) hours as needed for nausea or vomiting., Disp: 40 tablet, Rfl: 0   oxyCODONE-acetaminophen (PERCOCET) 5-325 MG tablet, Take 1-2 tablets by mouth every 6 (six) hours as needed. To be taken after surgery (Patient taking differently: Take 1-2 tablets by mouth every 6 (six) hours as needed. To be taken after surgery- hip), Disp: 40 tablet, Rfl: 0   topiramate (TOPAMAX) 100 MG tablet, Take 1 tablet (100 mg total) by mouth at bedtime., Disp: 90 tablet, Rfl: 4   methocarbamol (ROBAXIN) 500 MG tablet, Take 1 tablet (500 mg total) by mouth 2 (two) times daily as needed. To be taken after surgery (Patient not taking: Reported on 02/18/2021), Disp: 20 tablet, Rfl: 2   Allergies  Allergen Reactions   Other Itching and Other (See Comments)    Patient experienced intense itching in her arm that was accessed for a past angiogram     Review of Systems  Constitutional: Negative.   Eyes:  Negative for blurred vision.  Respiratory: Negative.  Negative for shortness of breath.   Cardiovascular: Negative.  Negative for palpitations.  Gastrointestinal: Negative.   Neurological: Negative.   Psychiatric/Behavioral: Negative.      Today's Vitals   02/18/21 1614  BP: 122/72  Pulse: 78  Temp: 97.9 F (36.6 C)  Weight: 194 lb 6.4 oz (88.2 kg)  Height: 5' 0.4" (1.534 m)  PainSc: 0-No pain   Body mass index is 37.46 kg/m.  Wt Readings from Last 3 Encounters:  02/18/21 194 lb 6.4 oz (88.2 kg)  01/27/21 196 lb (88.9 kg)  11/12/20 196 lb 3.2 oz  (89 kg)     Objective:  Physical Exam Vitals and nursing note reviewed.  Constitutional:      Appearance: Normal appearance. She is obese.  HENT:     Head: Normocephalic and atraumatic.     Nose:     Comments: Masked     Mouth/Throat:     Comments: Masked  Eyes:     Extraocular Movements: Extraocular movements intact.  Cardiovascular:  Rate and Rhythm: Normal rate and regular rhythm.     Heart sounds: Normal heart sounds.  Pulmonary:     Effort: Pulmonary effort is normal.     Breath sounds: Normal breath sounds.  Musculoskeletal:     Cervical back: Normal range of motion.  Skin:    General: Skin is warm.  Neurological:     General: No focal deficit present.     Mental Status: She is alert.  Psychiatric:        Mood and Affect: Mood normal.        Behavior: Behavior normal.        Assessment And Plan:     1. Essential hypertension, benign Comments: Chronic, well controlled. No med changes. Advised to follow low sodium diet and lead an active lifestyle.  - CBC no Diff - CMP14+EGFR  2. Stenosis of artery (HCC) Comments: received a stent to the left proximal ICA via right common femoral artery - still on Brilinta, states she was advised to take along w/ Eliquis. She will likely be on Brilinta for 6-12 months, as per Dr. Estanislado Pandy. Pt encouraged to keep f/u appt with Dr. Estanislado Pandy.   3. Paroxysmal atrial fibrillation (HCC) Comments: Currently in NSR, anticoagulated. She is encouraged to comply with current medication regimen.   4. Abnormal glucose Comments: Her a1c has been elevated in the past, I will re-evaluate this today. Encouraged to limit intake of sugary beverages, diet drinks.  - Hemoglobin A1c  5. Class 2 severe obesity due to excess calories with serious comorbidity and body mass index (BMI) of 37.0 to 37.9 in adult [facility]) Comments: She is encouraged to strive for BMI less than 30 to decrease cardiac risk. Advised to aim for at least 150 minutes of  exercise/week.    Patient was given opportunity to ask questions. Patient verbalized understanding of the plan and was able to repeat key elements of the plan. All questions were answered to their satisfaction.   I, Maximino Greenland, MD, have reviewed all documentation for this visit. The documentation on 02/21/21 for the exam, diagnosis, procedures, and orders are all accurate and complete.   IF YOU HAVE BEEN REFERRED TO A SPECIALIST, IT MAY TAKE 1-2 WEEKS TO SCHEDULE/PROCESS THE REFERRAL. IF YOU HAVE NOT HEARD FROM US/SPECIALIST IN TWO WEEKS, PLEASE GIVE [place] A CALL AT [phone].   THE PATIENT IS ENCOURAGED TO PRACTICE SOCIAL DISTANCING DUE TO THE COVID-19 PANDEMIC.

## 2021-02-19 LAB — CMP14+EGFR
ALT: 20 IU/L (ref 0–32)
AST: 18 IU/L (ref 0–40)
Albumin/Globulin Ratio: 1.5 (ref 1.2–2.2)
Albumin: 4.6 g/dL (ref 3.8–4.9)
Alkaline Phosphatase: 93 IU/L (ref 44–121)
BUN/Creatinine Ratio: 19 (ref 12–28)
BUN: 19 mg/dL (ref 8–27)
Bilirubin Total: 0.4 mg/dL (ref 0.0–1.2)
CO2: 20 mmol/L (ref 20–29)
Calcium: 9.6 mg/dL (ref 8.7–10.3)
Chloride: 104 mmol/L (ref 96–106)
Creatinine, Ser: 1 mg/dL (ref 0.57–1.00)
Globulin, Total: 3.1 g/dL (ref 1.5–4.5)
Glucose: 98 mg/dL (ref 70–99)
Potassium: 3.9 mmol/L (ref 3.5–5.2)
Sodium: 140 mmol/L (ref 134–144)
Total Protein: 7.7 g/dL (ref 6.0–8.5)
eGFR: 64 mL/min/{1.73_m2} (ref >59)

## 2021-02-19 LAB — CBC
Hematocrit: 35.8 % (ref 34.0–46.6)
Hemoglobin: 11.8 g/dL (ref 11.1–15.9)
MCH: 28 pg (ref 26.6–33.0)
MCHC: 33 g/dL (ref 31.5–35.7)
MCV: 85 fL (ref 79–97)
Platelets: 327 10*3/uL (ref 150–450)
RBC: 4.21 x10E6/uL (ref 3.77–5.28)
RDW: 13.1 % (ref 11.7–15.4)
WBC: 8.1 10*3/uL (ref 3.4–10.8)

## 2021-02-19 LAB — HEMOGLOBIN A1C
Est. average glucose Bld gHb Est-mCnc: 134 mg/dL
Hgb A1c MFr Bld: 6.3 % — ABNORMAL HIGH (ref 4.8–5.6)

## 2021-02-21 DIAGNOSIS — E66812 Obesity, class 2: Secondary | ICD-10-CM | POA: Insufficient documentation

## 2021-02-21 DIAGNOSIS — Z6837 Body mass index (BMI) 37.0-37.9, adult: Secondary | ICD-10-CM | POA: Insufficient documentation

## 2021-02-21 DIAGNOSIS — R7309 Other abnormal glucose: Secondary | ICD-10-CM | POA: Insufficient documentation

## 2021-03-05 ENCOUNTER — Telehealth (HOSPITAL_COMMUNITY): Payer: Self-pay

## 2021-03-05 NOTE — Telephone Encounter (Signed)
Called pt back regarding Brilinta/Eliquis. Informed her that she will need to contact her cardiologist about Eliquis and she will continue her Brilinta and ASA as prescribed by Dr. Corliss Skains. AW

## 2021-03-11 ENCOUNTER — Other Ambulatory Visit: Payer: Self-pay | Admitting: Internal Medicine

## 2021-03-11 DIAGNOSIS — I10 Essential (primary) hypertension: Secondary | ICD-10-CM

## 2021-03-13 ENCOUNTER — Other Ambulatory Visit: Payer: Self-pay | Admitting: Student

## 2021-03-13 MED ORDER — TICAGRELOR 90 MG PO TABS: 90.0000 mg | ORAL_TABLET | Freq: Two times a day (BID) | ORAL | 3 refills | Status: DC

## 2021-03-18 ENCOUNTER — Other Ambulatory Visit: Payer: Self-pay | Admitting: Internal Medicine

## 2021-03-30 ENCOUNTER — Other Ambulatory Visit (HOSPITAL_COMMUNITY): Payer: Self-pay | Admitting: Interventional Radiology

## 2021-03-30 DIAGNOSIS — I6522 Occlusion and stenosis of left carotid artery: Secondary | ICD-10-CM

## 2021-04-08 ENCOUNTER — Ambulatory Visit (HOSPITAL_COMMUNITY)
Admission: RE | Admit: 2021-04-08 | Discharge: 2021-04-08 | Disposition: A | Discharge status: Home / Self Care | Payer: 59 | Source: Ambulatory Visit | Attending: Interventional Radiology | Admitting: Interventional Radiology

## 2021-04-08 DIAGNOSIS — I6522 Occlusion and stenosis of left carotid artery: Secondary | ICD-10-CM

## 2021-04-08 NOTE — Progress Notes (Signed)
Carotid duplex has been completed.  ? ?Preliminary results in CV Proc.  ? ?Gordon Vandunk Caren Garske ?04/08/2021 1:20 PM    ?

## 2021-04-15 ENCOUNTER — Telehealth: Payer: Self-pay | Admitting: Orthopaedic Surgery

## 2021-04-15 NOTE — Telephone Encounter (Signed)
Patient is scheduled for left total hip 05-11-21 with Dr. Erlinda Hong and is requesting a note to provide her employer.  Please include the surgery date and the projected time out of work.  Call patient when note is ready.  [phone] ?

## 2021-04-16 ENCOUNTER — Other Ambulatory Visit: Payer: Self-pay

## 2021-04-16 NOTE — Telephone Encounter (Signed)
Out of work 3 months.  Thanks.

## 2021-04-16 NOTE — Telephone Encounter (Signed)
Note made.  

## 2021-04-17 ENCOUNTER — Telehealth (HOSPITAL_BASED_OUTPATIENT_CLINIC_OR_DEPARTMENT_OTHER): Payer: Self-pay | Admitting: *Deleted

## 2021-04-17 NOTE — Telephone Encounter (Signed)
? ?  Pre-operative Risk Assessment  ?  ?Patient Name: [name]  ?DOB: 1960/12/31 ?MRN: [id number]  ? ?  ? ?Request for Surgical Clearance   ? ?Procedure:   LEFT TOTAL HIP  ? ?Date of Surgery:  Clearance 05/11/21                              ?   ?Surgeon:  Cheri Fowler, MD ?Surgeon's Group or Practice Name:  [facility] ?Phone number:  [phone] ?Fax number:  [phone] ?  ?Type of Clearance Requested:   ?- Medical  ?- Pharmacy:  Hold Ticagrelor (Brilinta) and Apixaban (Eliquis) WANTS CARDIOLOGIST TO DETERMINE  ?  ?Type of Anesthesia:  Spinal ?{ ?

## 2021-04-20 ENCOUNTER — Ambulatory Visit: Payer: 59

## 2021-04-20 ENCOUNTER — Telehealth (HOSPITAL_COMMUNITY): Payer: Self-pay

## 2021-04-20 ENCOUNTER — Ambulatory Visit (INDEPENDENT_AMBULATORY_CARE_PROVIDER_SITE_OTHER): Payer: 59

## 2021-04-20 ENCOUNTER — Telehealth: Payer: Self-pay | Admitting: *Deleted

## 2021-04-20 DIAGNOSIS — M1612 Unilateral primary osteoarthritis, left hip: Secondary | ICD-10-CM

## 2021-04-20 NOTE — Telephone Encounter (Signed)
Pt agreeable to plan of care for tele pre op appt 04/23/21 @ 9:40. Med rec and consent are done.  ? ?  ?Patient Consent for Virtual Visit  ? ? ?   ? ?[name] has provided verbal consent on 04/20/2021 for a virtual visit (video or telephone). ? ? ?CONSENT FOR VIRTUAL VISIT FOR:  [name]  ?By participating in this virtual visit I agree to the following: ? ?I hereby voluntarily request, consent and authorize [facility] and its employed or contracted physicians, physician assistants, nurse practitioners or other licensed health care professionals (the Practitioner), to provide me with telemedicine health care services (the ?Services") as deemed necessary by the treating Practitioner. I acknowledge and consent to receive the Services by the Practitioner via telemedicine. I understand that the telemedicine visit will involve communicating with the Practitioner through live audiovisual communication technology and the disclosure of certain medical information by electronic transmission. I acknowledge that I have been given the opportunity to request an in-person assessment or other available alternative prior to the telemedicine visit and am voluntarily participating in the telemedicine visit. ? ?I understand that I have the right to withhold or withdraw my consent to the use of telemedicine in the course of my care at any time, without affecting my right to future care or treatment, and that the Practitioner or I may terminate the telemedicine visit at any time. I understand that I have the right to inspect all information obtained and/or recorded in the course of the telemedicine visit and may receive copies of available information for a reasonable fee.  I understand that some of the potential risks of receiving the Services via telemedicine include:  ?Delay or interruption in medical evaluation due to technological equipment failure or disruption; ?Information transmitted may not be sufficient (e.g.  poor resolution of images) to allow for appropriate medical decision making by the Practitioner; and/or  ?In rare instances, security protocols could fail, causing a breach of personal health information. ? ?Furthermore, I acknowledge that it is my responsibility to provide information about my medical history, conditions and care that is complete and accurate to the best of my ability. I acknowledge that Practitioner's advice, recommendations, and/or decision may be based on factors not within their control, such as incomplete or inaccurate data provided by me or distortions of diagnostic images or specimens that may result from electronic transmissions. I understand that the practice of medicine is not an exact science and that Practitioner makes no warranties or guarantees regarding treatment outcomes. I acknowledge that a copy of this consent can be made available to me via my patient portal [facility] MyChart), or I can request a printed copy by calling the office of [facility].   ? ?I understand that my insurance will be billed for this visit.  ? ?I have read or had this consent read to me. ?I understand the contents of this consent, which adequately explains the benefits and risks of the Services being provided via telemedicine.  ?I have been provided ample opportunity to ask questions regarding this consent and the Services and have had my questions answered to my satisfaction. ?I give my informed consent for the services to be provided through the use of telemedicine in my medical care ? ? ? ?

## 2021-04-20 NOTE — Telephone Encounter (Signed)
Pt agreed to f/u in 6 months with a us carotid. AW 

## 2021-04-20 NOTE — Telephone Encounter (Signed)
Patient is returning call about pre op clearance  ?

## 2021-04-20 NOTE — Telephone Encounter (Signed)
Primary Cardiologist:Tiffany Duke Salvia, MD ? ?Chart reviewed as part of pre-operative protocol coverage. Because of [name]'s past medical history and time since last visit, [name] will require a virtual visit/telephone call in order to better assess preoperative cardiovascular risk. ? ?Pre-op covering staff: ?- Please contact patient, obtain consent, and schedule appointment  ? ?Levi Aland, NP-C ? ?  ?04/20/2021, 10:59 AM ?[facility] ?[address] ?Office [phone] Fax [phone] ? ? ? ? ?

## 2021-04-20 NOTE — Progress Notes (Signed)
Patient came in today for updated Xrays.  ?

## 2021-04-20 NOTE — Telephone Encounter (Signed)
See pre-op notes 

## 2021-04-20 NOTE — Telephone Encounter (Signed)
Patient aware.

## 2021-04-20 NOTE — Telephone Encounter (Addendum)
Patient with diagnosis of afib on Eliquis for anticoagulation.   ? ?Procedure: LEFT TOTAL HIP  ?Date of procedure: 05/11/21 ? ? ?CHA2DS2-VASc Score = 5  ? This indicates a 7.2% annual risk of stroke. ?The patient's score is based upon: ?CHF History: 0 ?HTN History: 1 ?Diabetes History: 0 ?Stroke History: 2 ?Vascular Disease History: 1 ?Age Score: 0 ?Gender Score: 1 ?  ? ? ?CrCl 59 ml/min ?Platelet count 327 ? ?Per office protocol, patient can hold Eliquis for 3 days prior to procedure.   ? ?Patient had hip surgery in Oct 2022. Per Neurology due to her stroke hx patient needed bridging with Lovenox. Dr. Oval Linsey was in agreement. Therefore patient will require Lovenox bridge.  ? ?4/27 Take Eliquis in the AM, NO PM Eliquis. Inject 120mg  of Lovenox into the abdomen at 9PM. Rotate injection sites. ? ?4/28 No Eliquis, Inject 120mg  of Lovenox into the abdomen at 9PM. ? ?4/29 No Eliquis, Inject 120mg  of Lovenox into the abdomen at 9PM. ? ?4/30: No Eliquis, No Lovenox ? ?5/1: Procedure day No Eliquis, No Lovenox ? ?Resume Eliquis 5mg  twice a day as soon as safely possible as deemed by surgeon.  ?

## 2021-04-20 NOTE — Telephone Encounter (Signed)
Left message for the pt to call the office for a tele pre op appt 

## 2021-04-20 NOTE — Telephone Encounter (Signed)
Pt agreeable to plan of care for tele pre op appt 04/23/21 @ 9:40. Med rec and consent are done.  ?

## 2021-04-23 ENCOUNTER — Ambulatory Visit (INDEPENDENT_AMBULATORY_CARE_PROVIDER_SITE_OTHER): Payer: 59 | Admitting: Nurse Practitioner

## 2021-04-23 ENCOUNTER — Encounter: Payer: Self-pay | Admitting: Nurse Practitioner

## 2021-04-23 DIAGNOSIS — Z01810 Encounter for preprocedural cardiovascular examination: Secondary | ICD-10-CM | POA: Diagnosis not present

## 2021-04-23 NOTE — Telephone Encounter (Signed)
Spoke with patient.  She still has syringes from October scheduled procedure.  Will check expiration on those tonight.   Reviewed dosing information and injection technique.  Answered all patient questions.   ?

## 2021-04-23 NOTE — Progress Notes (Signed)
? ?Virtual Visit via Telephone Note  ? ?This visit type was conducted due to national recommendations for restrictions regarding the COVID-19 Pandemic (e.g. social distancing) in an effort to limit this patient's exposure and mitigate transmission in our community.  Due to her co-morbid illnesses, this patient is at least at moderate risk for complications without adequate follow up.  This format is felt to be most appropriate for this patient at this time.  The patient did not have access to video technology/had technical difficulties with video requiring transitioning to audio format only (telephone).  All issues noted in this document were discussed and addressed.  No physical exam could be performed with this format.  Please refer to the patient's chart for her  consent to telehealth for [facility]. ? ?Evaluation Performed:  Preoperative cardiovascular risk assessment ?_____________  ? ?Date:  04/23/2021  ? ?Patient ID:  [name], DOB 11/20/1960, MRN [id number] ?Patient Location:  ?Home ?Provider location:   ?Office ? ?Primary Care Provider:  Glendale Chard, MD ?Primary Cardiologist:  Skeet Latch, MD ? ?Chief Complaint  ?  ?61 y.o. y/o female with a h/o CAD s/p PCI/DES to LCx 2020, hypertension, carotid stenosis s/p stent to left proximal ICA, stroke, PAF, SAH on aspirin and Plavix who is pending left total hip replacement, and presents today for telephonic preoperative cardiovascular risk assessment. ? ?Past Medical History  ?  ?Past Medical History:  ?Diagnosis Date  ? Allergy   ? Arthritis   ? back   ? Atrial fibrillation ([place])   ? Atypical chest pain 06/30/2016  ? Back pain   ? DUE TO INJURY AT WORK ON05/2013  ? Bruises easily   ? Chronic lower back pain   ? Coronary artery disease   ? Depression   ? was on meds 2 yrs ago   ? Headache(784.0)   ? rarely  ? History of bronchitis   ? last time about 30yrs ago   ? Hypertension   ? takes Benicar daily  ? Hypothyroidism 06/30/2016  ? patient denies;  states "i have never been told i had thyroid problems" and does not take any medications for thyroid  ? OSA (obstructive sleep apnea) 04/28/2020  ? Preoperative evaluation of a medical condition to rule out surgical contraindications (TAR required) 09/12/2020  ? Pure hypercholesterolemia 05/31/2019  ? Stroke [facility])   ? no residual  ? Vitamin D deficiency   ? takes Vitamin D 2 times/wk  ? Weakness   ? and numbness right foot  ? ?Past Surgical History:  ?Procedure Laterality Date  ? BACK SURGERY    ? CARPAL TUNNEL RELEASE Right   ? [place]  ? COLONOSCOPY    ? EXPLORATORY LAPAROTOMY  1991  ? "took out fatty tumor" (11/09/2012)  ? IR ANGIO INTRA EXTRACRAN SEL COM CAROTID INNOMINATE BILAT MOD SED  08/10/2019  ? IR ANGIO INTRA EXTRACRAN SEL COM CAROTID INNOMINATE BILAT MOD SED  10/07/2020  ? IR ANGIO INTRA EXTRACRAN SEL COM CAROTID INNOMINATE UNI L MOD SED  11/05/2020  ? IR ANGIO VERTEBRAL SEL SUBCLAVIAN INNOMINATE BILAT MOD SED  10/07/2020  ? IR ANGIO VERTEBRAL SEL VERTEBRAL BILAT MOD SED  08/10/2019  ? IR CT HEAD LTD  11/05/2020  ? IR INTRAVSC STENT CERV CAROTID W/EMB-PROT MOD SED INCL ANGIO  11/05/2020  ? IR RADIOLOGIST EVAL & MGMT  11/21/2020  ? IR US GUIDE VASC ACCESS RIGHT  08/10/2019  ? IR US GUIDE VASC ACCESS RIGHT  10/07/2020  ?  LEFT HEART CATH AND CORONARY ANGIOGRAPHY N/A 08/06/2016  ? Procedure: Left Heart Cath and Coronary Angiography;  Surgeon: Belva Crome, MD;  Location: [place] [facility];  Service: Cardiovascular;  Laterality: N/A;  ? LEFT HEART CATH AND CORONARY ANGIOGRAPHY N/A 07/12/2018  ? Procedure: LEFT HEART CATH AND CORONARY ANGIOGRAPHY;  Surgeon: Leonie Man, MD;  Location: [place] [facility];  Service: Cardiovascular;  Laterality: N/A;  ? LUMBAR LAMINECTOMY/DECOMPRESSION MICRODISCECTOMY  11/09/2012  ? "L3-4" (11/09/2012)  ? LUMBAR LAMINECTOMY/DECOMPRESSION MICRODISCECTOMY N/A 11/09/2012  ? Procedure: L3-L4 DECOMPRESSION AND MICRODISCECTOMY   (1 LEVEL);  Surgeon: Melina Schools, MD;   Location: [place];  Service: Orthopedics;  Laterality: N/A;  ? RADIOLOGY WITH ANESTHESIA N/A 11/05/2020  ? Procedure: STENTING;  Surgeon: Luanne Bras, MD;  Location: [place];  Service: Radiology;  Laterality: N/A;  ? ? ?Allergies ? ?Allergies  ?Allergen Reactions  ? Other Itching and Other (See Comments)  ?  Patient experienced intense itching in her arm that was accessed for a past angiogram  ? ? ?History of Present Illness  ?  ?[name] is a 61 y.o. female who presents via audio/video conferencing for a telehealth visit today.  Pt was last seen in cardiology clinic on 09/12/20 by Dr. Oval Linsey.  At that time [name] was doing well.  The patient is now pending left hip replacement.  Since her last visit, she reports she is doing well and denies symptoms of chest pain, dyspnea, palpitations, orthopnea, edema, PND, presyncope, syncope. No recent change in activity tolerance. She denies bleeding concerns. She had a stent to her left carotid artery November 2022.  ? ? ?Home Medications  ?  ?Prior to Admission medications   ?Medication Sig Start Date End Date Taking? Authorizing Provider  ?acetaminophen (TYLENOL) 500 MG tablet Take 500-1,000 mg by mouth every 6 (six) hours as needed (pain or headaches).    [provider]  ?apixaban (ELIQUIS) 5 MG TABS tablet Take 1 tablet (5 mg total) by mouth 2 (two) times daily. 04/17/20   Deberah Pelton, NP  ?atorvastatin (LIPITOR) 80 MG tablet Take 1 tablet (80 mg total) by mouth daily at 6 PM. 10/13/20   Glendale Chard, MD  ?benzonatate (TESSALON PERLES) 100 MG capsule Take 1 capsule (100 mg total) by mouth 3 (three) times daily as needed for cough. 11/12/20 11/12/21  Bary Castilla, NP  ?cholecalciferol (VITAMIN D3) 25 MCG (1000 UNIT) tablet Take 1,000 Units by mouth daily.     [provider]  ?diltiazem (CARDIZEM CD) 120 MG 24 hr capsule TAKE 1 CAPSULE BY MOUTH EVERY DAY 03/18/21   Glendale Chard, MD  ?ezetimibe (ZETIA) 10 MG tablet TAKE 1  TABLET BY MOUTH EVERY DAY 11/14/20   Skeet Latch, MD  ?fluticasone (FLONASE) 50 MCG/ACT nasal spray PLACE 2 SPRAYS IN BOTH NOSTRILS DAILY AS NEEDED FOR ALLERGIES OR RHINITIS 10/13/20   Glendale Chard, MD  ?furosemide (LASIX) 40 MG tablet TAKE 1 TABLET DAILY FOR 3 DAYS AND THEN AS DIRECTED 01/01/21   Skeet Latch, MD  ?HYDROcodone-acetaminophen (NORCO) 5-325 MG tablet Take 1 tablet by mouth 2 (two) times daily as needed. 01/06/21   Aundra Dubin, PA-C  ?hydrOXYzine (ATARAX/VISTARIL) 25 MG tablet Take 1 tablet (25 mg total) by mouth 3 (three) times daily as needed. 08/21/19   Minette Brine, FNP  ?levocetirizine (XYZAL) 5 MG tablet TAKE 1 TABLET BY MOUTH EVERY DAY 12/19/20   Glendale Chard, MD  ?Magnesium 250 MG TABS TAKE 1 TABLET  BY MOUTH DAILY WITH EVENING MEALS 10/13/20   Glendale Chard, MD  ?methocarbamol (ROBAXIN) 500 MG tablet Take 1 tablet (500 mg total) by mouth 2 (two) times daily as needed. To be taken after surgery 10/20/20   Aundra Dubin, PA-C  ?mometasone (ELOCON) 0.1 % cream APPLY TO AFFECTED AREA EVERY DAY 10/29/20   Minette Brine, FNP  ?Olmesartan-amLODIPine-HCTZ 20-5-12.5 MG TABS TAKE 1 TABLET BY MOUTH EVERY DAY 03/11/21   Glendale Chard, MD  ?ondansetron (ZOFRAN) 4 MG tablet Take 1 tablet (4 mg total) by mouth every 8 (eight) hours as needed for nausea or vomiting. 10/20/20   Aundra Dubin, PA-C  ?oxyCODONE-acetaminophen (PERCOCET) 5-325 MG tablet Take 1-2 tablets by mouth every 6 (six) hours as needed. To be taken after surgery ?Patient taking differently: Take 1-2 tablets by mouth every 6 (six) hours as needed. To be taken after surgery- hip 10/20/20   Aundra Dubin, PA-C  ?ticagrelor (BRILINTA) 90 MG TABS tablet Take 1 tablet (90 mg total) by mouth 2 (two) times daily. 03/13/21   Han, Aimee H, PA-C  ?topiramate (TOPAMAX) 100 MG tablet Take 1 tablet (100 mg total) by mouth at bedtime. 09/09/20   Marcial Pacas, MD  ? ? ?Physical Exam  ?  ?Vital Signs:  [name] does not have vital  signs available for review today. ? ?Given telephonic nature of communication, physical exam is limited. ?AAOx3. NAD. Normal affect.  Speech and respirations are unlabored. ? ?Accessory Clinical Findings

## 2021-04-24 ENCOUNTER — Other Ambulatory Visit: Payer: Self-pay | Admitting: General Practice

## 2021-04-24 NOTE — Telephone Encounter (Signed)
Prescription refill request for Eliquis received. ?Indication:Afib ?Last office visit:4/23 ?Scr:1.0 ?Age: 61 ?Weight:88.2 kg ? ?Prescription refilled ? ?

## 2021-04-27 ENCOUNTER — Telehealth: Payer: Self-pay | Admitting: Orthopaedic Surgery

## 2021-04-27 NOTE — Telephone Encounter (Signed)
FMLA Source forms received. To [employer]. 

## 2021-04-28 NOTE — Progress Notes (Signed)
Surgical Instructions ? ? ? Your procedure is scheduled on Monday, May 1st, 2023. ? ? [facility] at 07:35 A.M., then check in with the Admitting office. ? Call this number if you have problems the morning of surgery: ? [phone] ? ? If you have any questions prior to your surgery date call [phone]: Open Monday-Friday 8am-4pm ? ? ? Remember: ? Do not eat after midnight the night before your surgery ? ?You may drink clear liquids until 06:35 the morning of your surgery.   ?Clear liquids allowed are: Water, Non-Citrus Juices (without pulp), Carbonated Beverages, Clear Tea, Black Coffee ONLY (NO MILK, CREAM OR POWDERED CREAMER of any kind), and Gatorade ? ?Patient Instructions ? ?The night before surgery:  ?No food after midnight. ONLY clear liquids after midnight ? ?The day of surgery (if you do NOT have diabetes):  ?Drink ONE (1) Pre-Surgery Clear Ensure by 06:35 the morning of surgery. Drink in one sitting. Do not sip.  ?This drink was given to you during your hospital  ?pre-op appointment visit. ? ?Nothing else to drink after completing the  ?Pre-Surgery Clear Ensure. ? ?       If you have questions, please contact your surgeon?s office.  ?  ? Take these medicines the morning of surgery with A SIP OF WATER:  ? ?diltiazem (CARDIZEM CD) ?ezetimibe (ZETIA)  ?hydrOXYzine (ATARAX/VISTARIL) ? ?If needed: ? ?acetaminophen (TYLENOL) ?benzonatate (TESSALON PERLES) ?fluticasone (FLONASE)  ?HYDROcodone-acetaminophen (NORCO) ?hydrOXYzine (ATARAX/VISTARIL) ?ondans[facility](ZOFRAN)  ? ? ?Per office protocol,  hold Eliquis for 3 days prior to procedure.   ?  ?4/27 Take Eliquis in the AM, NO PM Eliquis. Inject 120mg  of Lovenox into the abdomen at 9PM. Rotate injection sites. ?  ?4/28 No Eliquis, Inject 120mg  of Lovenox into the abdomen at 9PM. ?  ?4/29 No Eliquis, Inject 120mg  of Lovenox into the abdomen at 9PM. ?  ?4/30: No Eliquis, No Lovenox ?  ?5/1: Procedure day No Eliquis, No Lovenox ? ?Follow  your surgeon's instructions on when to stop ticagrelor (BRILINTA).  If no instructions were given by your surgeon then you will need to call the office to get those instructions.    ? ?As of today, STOP taking any Aspirin (unless otherwise instructed by your surgeon) Aleve, Naproxen, Ibuprofen, Motrin, Advil, Goody's, BC's, all herbal medications, fish oil, and all vitamins. ? ? ? The day of surgery: ?         ?Do not wear jewelry or makeup ?Do not wear lotions, powders, perfumes, or deodorant. ?Do not shave 48 hours prior to surgery.   ?Do not bring valuables to the hospital. ?Do not wear nail polish, gel polish, artificial nails, or any other type of covering on natural nails (fingers and toes) ?If you have artificial nails or gel coating that need to be removed by a nail salon, please have this removed prior to surgery. Artificial nails or gel coating may interfere with anesthesia's ability to adequately monitor your vital signs. ? ?[facility] is not responsible for any belongings or valuables. .  ? ?Do NOT Smoke (Tobacco/Vaping)  24 hours prior to your procedure ? ?If you use a CPAP at night, you may bring your mask for your overnight stay. ?  ?Contacts, glasses, hearing aids, dentures or partials may not be worn into surgery, please bring cases for these belongings ?  ?For patients admitted to the hospital, discharge time will be determined by your treatment team. ?  ?Patients discharged the day  of surgery will not be allowed to drive home, and someone needs to stay with them for 24 hours. ? ? ?SURGICAL WAITING ROOM VISITATION ?Patients having surgery or a procedure in a hospital may have two support people. ?Children under the age of 28 must have an adult with them who is not the patient. ?They may stay in the waiting area during the procedure and may switch out with other visitors. If the patient needs to stay at the hospital during part of their recovery, the visitor guidelines for inpatient rooms  apply. ? ?Please refer to the [facility] website for the visitor guidelines for Inpatients (after your surgery is over and you are in a regular room).  ? ? ? ?Special instructions:   ? ?Oral Hygiene is also important to reduce your risk of infection.  Remember - BRUSH YOUR TEETH THE MORNING OF SURGERY WITH YOUR REGULAR TOOTHPASTE ? ? ?[facility]- Preparing For Surgery ? ?Before surgery, you can play an important role. Because skin is not sterile, your skin needs to be as free of germs as possible. You can reduce the number of germs on your skin by washing with CHG (chlorahexidine gluconate) Soap before surgery.  CHG is an antiseptic cleaner which kills germs and bonds with the skin to continue killing germs even after washing.   ? ? ?Please do not use if you have an allergy to CHG or antibacterial soaps. If your skin becomes reddened/irritated stop using the CHG.  ?Do not shave (including legs and underarms) for at least 48 hours prior to first CHG shower. It is OK to shave your face. ? ?Please follow these instructions carefully. ?  ? ? Shower the NIGHT BEFORE SURGERY and the MORNING OF SURGERY with CHG Soap.  ? If you chose to wash your hair, wash your hair first as usual with your normal shampoo. After you shampoo, rinse your hair and body thoroughly to remove the shampoo.  Then [employer] and genitals (private parts) with your normal soap and rinse thoroughly to remove soap. ? ?After that Use CHG Soap as you would any other liquid soap. You can apply CHG directly to the skin and wash gently with a scrungie or a clean washcloth.  ? ?Apply the CHG Soap to your body ONLY FROM THE NECK DOWN.  Do not use on open wounds or open sores. Avoid contact with your eyes, ears, mouth and genitals (private parts). Wash Face and genitals (private parts)  with your normal soap.  ? ?Wash thoroughly, paying special attention to the area where your surgery will be performed. ? ?Thoroughly rinse your body with warm water from the  neck down. ? ?DO NOT shower/wash with your normal soap after using and rinsing off the CHG Soap. ? ?Pat yourself dry with a CLEAN TOWEL. ? ?Wear CLEAN PAJAMAS to bed the night before surgery ? ?Place CLEAN SHEETS on your bed the night before your surgery ? ?DO NOT SLEEP WITH PETS. ? ? ?Day of Surgery: ? ?Take a shower with CHG soap. ?Wear Clean/Comfortable clothing the morning of surgery ?Do not apply any deodorants/lotions.   ?Remember to brush your teeth WITH YOUR REGULAR TOOTHPASTE. ? ? ? ?If you received a COVID test during your pre-op visit  it is requested that you wear a mask when out in public, stay away from anyone that may not be feeling well and notify your surgeon if you develop symptoms. If you have been in contact with anyone that has tested positive  in the last 10 days please notify you surgeon. ? ?  ?Please read over the following fact sheets that you were given.   ?

## 2021-04-29 ENCOUNTER — Encounter (HOSPITAL_COMMUNITY)
Admission: RE | Admit: 2021-04-29 | Discharge: 2021-04-29 | Disposition: A | Discharge status: Home / Self Care | Payer: 59 | Source: Ambulatory Visit | Attending: Orthopaedic Surgery | Admitting: Orthopaedic Surgery

## 2021-04-29 ENCOUNTER — Encounter (HOSPITAL_COMMUNITY): Payer: Self-pay

## 2021-04-29 ENCOUNTER — Other Ambulatory Visit: Payer: Self-pay

## 2021-04-29 ENCOUNTER — Telehealth: Payer: Self-pay | Admitting: Student

## 2021-04-29 VITALS — BP 184/87 | HR 96 | Temp 98.7°F | Resp 18 | Ht 61.0 in | Wt 198.0 lb

## 2021-04-29 DIAGNOSIS — M1612 Unilateral primary osteoarthritis, left hip: Secondary | ICD-10-CM | POA: Diagnosis not present

## 2021-04-29 DIAGNOSIS — Z7982 Long term (current) use of aspirin: Secondary | ICD-10-CM | POA: Insufficient documentation

## 2021-04-29 DIAGNOSIS — Z01818 Encounter for other preprocedural examination: Secondary | ICD-10-CM

## 2021-04-29 DIAGNOSIS — Z95828 Presence of other vascular implants and grafts: Secondary | ICD-10-CM | POA: Insufficient documentation

## 2021-04-29 DIAGNOSIS — G4733 Obstructive sleep apnea (adult) (pediatric): Secondary | ICD-10-CM | POA: Diagnosis not present

## 2021-04-29 DIAGNOSIS — Z01812 Encounter for preprocedural laboratory examination: Secondary | ICD-10-CM | POA: Diagnosis present

## 2021-04-29 DIAGNOSIS — I779 Disorder of arteries and arterioles, unspecified: Secondary | ICD-10-CM | POA: Diagnosis not present

## 2021-04-29 DIAGNOSIS — Z955 Presence of coronary angioplasty implant and graft: Secondary | ICD-10-CM | POA: Insufficient documentation

## 2021-04-29 DIAGNOSIS — I480 Paroxysmal atrial fibrillation: Secondary | ICD-10-CM | POA: Insufficient documentation

## 2021-04-29 DIAGNOSIS — I2510 Atherosclerotic heart disease of native coronary artery without angina pectoris: Secondary | ICD-10-CM | POA: Diagnosis not present

## 2021-04-29 DIAGNOSIS — Z8673 Personal history of transient ischemic attack (TIA), and cerebral infarction without residual deficits: Secondary | ICD-10-CM | POA: Diagnosis not present

## 2021-04-29 DIAGNOSIS — Z9989 Dependence on other enabling machines and devices: Secondary | ICD-10-CM | POA: Insufficient documentation

## 2021-04-29 DIAGNOSIS — Z7902 Long term (current) use of antithrombotics/antiplatelets: Secondary | ICD-10-CM | POA: Insufficient documentation

## 2021-04-29 DIAGNOSIS — I10 Essential (primary) hypertension: Secondary | ICD-10-CM | POA: Insufficient documentation

## 2021-04-29 LAB — COMPREHENSIVE METABOLIC PANEL
ALT: 23 U/L (ref 0–44)
AST: 20 U/L (ref 15–41)
Albumin: 4.2 g/dL (ref 3.5–5.0)
Alkaline Phosphatase: 77 U/L (ref 38–126)
Anion gap: 6 (ref 5–15)
BUN: 14 mg/dL (ref 8–23)
CO2: 21 mmol/L — ABNORMAL LOW (ref 22–32)
Calcium: 9.3 mg/dL (ref 8.9–10.3)
Chloride: 112 mmol/L — ABNORMAL HIGH (ref 98–111)
Creatinine, Ser: 0.95 mg/dL (ref 0.44–1.00)
GFR, Estimated: >60 mL/min (ref >60)
Glucose, Bld: 101 mg/dL — ABNORMAL HIGH (ref 70–99)
Potassium: 4 mmol/L (ref 3.5–5.1)
Sodium: 139 mmol/L (ref 135–145)
Total Bilirubin: 1.1 mg/dL (ref 0.3–1.2)
Total Protein: 7.9 g/dL (ref 6.5–8.1)

## 2021-04-29 LAB — SURGICAL PCR SCREEN

## 2021-04-29 LAB — CBC
HCT: 37.2 % (ref 36.0–46.0)
Hemoglobin: 11.8 g/dL — ABNORMAL LOW (ref 12.0–15.0)
MCH: 28.5 pg (ref 26.0–34.0)
MCHC: 31.7 g/dL (ref 30.0–36.0)
MCV: 89.9 fL (ref 80.0–100.0)
Platelets: 332 10*3/uL (ref 150–400)
RBC: 4.14 MIL/uL (ref 3.87–5.11)
RDW: 15.2 % (ref 11.5–15.5)
WBC: 8.2 10*3/uL (ref 4.0–10.5)
nRBC: 0 % (ref 0.0–0.2)

## 2021-04-29 NOTE — Progress Notes (Signed)
PCP - Dorothyann Peng ?Cardiologist - Chilton Si ?Received cardiac clearance on 04/23/21 ? ?Chest x-ray - n/a ?EKG - 11/05/20 ?Stress Test - 2018 ?ECHO - 03/17/20 ?Cardiac Cath - 07/12/18 ? ?SA - yes, wears CPAP ? ?Blood Thinner Instructions: follow your surgeon's instructions on when to stop Eliquis, and start Lovenox.  Follow your surgeon's instructions on when to stop Brilinta. ?Aspirin Instructions: follow your surgeon's instructions on when to stop ASA ? ?ERAS Protcol - yes, Ensure given ? ? ?COVID TEST- n/a ? ? ?Anesthesia review: yes, received cardiac clearance on 4/13/3 ?Patient stated she had a follow up appointment with Dr. Fatima Sanger office last week.  No one from his office explained to her when to stop Brilinta.  Patient has tried calling his office, as well as Dr. Warren Danes office with no luck. Debbie with Dr. Warren Danes office called today during PAT appointment and made aware of the situation.  Eunice Blase is currently working to find out when patient needs to stop Brilinta.   ? ?Patient denies shortness of breath, fever, cough and chest pain at PAT appointment ? ? ?All instructions explained to the patient, with a verbal understanding of the material. Patient agrees to go over the instructions while at home for a better understanding. Patient also instructed to self quarantine after being tested for COVID-19. The opportunity to ask questions was provided. ? ? ?

## 2021-04-29 NOTE — Telephone Encounter (Signed)
Patient scheduled for orthopedic procedure with Dr. Glee Arvin 05/11/21. NIR contacted regarding patient's antiplatelet regimen with a request to hold prior to procedure. Dr. Corliss Skains states it's ok to stop Brilinta but not 81 mg aspirin. Dr. Corliss Skains states the patient must remain on at least 81 mg aspirin daily.  ? ?This was discussed with Mauri Reading at Dr. Warren Danes office. Please call NIR with any further questions.  ? Alwyn Ren, AGACNP-BC ?[phone] ?04/29/2021, 3:08 PM ? ? ?

## 2021-04-30 NOTE — Telephone Encounter (Signed)
That's completely fine to continue the aspirin through surgery

## 2021-04-30 NOTE — Progress Notes (Signed)
Anesthesia Chart Review: ? ?Follows with cardiology for hx of CAD s/p PCI/DES to LCx 2020, hypertension, carotid stenosis s/p stent to left proximal ICA 10/2020 (known CTO of [facility]), stroke, PAF, SAH on aspirin and Plavix.  ? ?Pt seen by cardiology Christen Bame, NP via televisit 04/23/21 for preop eval. Per note, "Preoperative Cardiovascular Risk Assessment: She is doing well from a cardiac perspective and may proceed to surgery without further cardiac testing. According to the Revised Cardiac Risk Index (RCRI), her Perioperative Risk of Major Cardiac Event is (%): 11. Her Functional Capacity in METs is: 6.05 according to the [facility] Activity Status Index (DASI)." Note also outlines recommendations to stop Eliquis 4/27 (AM dose last dose) and start Lovenox that PM. Last Lovenox 4/29 PM.  ? ?Dr. Estanislado Pandy cleared the pt to hold Brilinta. Per Telephone encounter by Soyla Dryer, NP on 04/29/21, "Patient scheduled for orthopedic procedure with Dr. Eduard Roux 05/11/21. NIR contacted regarding patient's antiplatelet regimen with a request to hold prior to procedure. Dr. Estanislado Pandy states it's ok to stop Brilinta but not 81 mg aspirin. Dr. Estanislado Pandy states the patient must remain on at least 81 mg aspirin daily." Dr. Erlinda Hong confirmed okay for pt to continue ASA. ? ?OSA on CPAP. ? ?Preop labs reviewed, unremarkable. ? ?EKG 11/06/20: Atrial fibrillation. Rate 81. Nonspecific T wave abnormality ? ?Carotid duplex 04/08/21: ?Summary:  ?Right Carotid: Evidence consistent with a total occlusion of the right  ?ICA.  ? ?Left Carotid: Left ICA stent: 50-75% stenosis.  ? ?Vertebrals: Bilateral vertebral arteries demonstrate antegrade flow.  ? ?TTE 03/17/20: ? 1. Left ventricular ejection fraction, by estimation, is 60 to 65%. The  ?left ventricle has normal function. The left ventricle has no regional  ?wall motion abnormalities. There is mild concentric left ventricular  ?hypertrophy. Left ventricular diastolic  ?parameters are consistent  with Grade I diastolic dysfunction (impaired  ?relaxation).  ? 2. Right ventricular systolic function is normal. The right ventricular  ?size is mildly enlarged. There is mildly elevated pulmonary artery  ?systolic pressure. The estimated right ventricular systolic pressure is  ?[id number] mmHg.  ? 3. The mitral valve is grossly normal. Mild mitral valve regurgitation.  ? 4. Tricuspid valve regurgitation is mild to moderate.  ? 5. There is flow acceleration noted in the LVOT without evidence of SAM.  ?Likely related to LVOT narrowing during systole in the setting of septal  ?hypertrophy. Flow acceleration occurs prior to the aortic valve with no  ?evidence of aortic stenosis. DI  ?0.7.  ? 6. The aortic valve is tricuspid. There is mild calcification of the  ?aortic valve. There is mild thickening of the aortic valve. Aortic valve  ?regurgitation is mild. Mild aortic valve sclerosis is present, with no  ?evidence of aortic valve stenosis.  ? 7. The inferior vena cava is normal in size with greater than 50%  ?respiratory variability, suggesting right atrial pressure of 3 mmHg.  ? ?Comparison(s): Compared to prior TTE in 08/08/19, the flow acceleration  ?across the LVOT and degree of TR is better captured on the current study.  ?Otherwise, there is no significant change.  ? ?Cath and PCI 07/12/18: ?SUMMARY ?Severe Single Vessel CAD mLCX (@ OM2) - s/p Successful DES PCI (Synergy DES 3.0 x 20 -- 3.3 mm) ?Otherwise mild disease in LAD & RCA ?Normal LVEF & normal to low EDP ?  ?RECOMMENDATIONS ?Overnight monitoring post-PCI with TR Band Removal  ?Continue aggressive Risk Factor Modification ? ?Karoline Caldwell, PA-C ?[facility]/Anesthesiology ?Phone [phone]  [phone] ?04/30/2021 3:57 PM ? ?

## 2021-04-30 NOTE — Progress Notes (Signed)
Do you know if they reswab day of surgery

## 2021-04-30 NOTE — Progress Notes (Signed)
Patient's surgical PCR invalid from PAT appointment.  Patient will need repeat surgical PCR DOS.  ?

## 2021-04-30 NOTE — Anesthesia Preprocedure Evaluation (Addendum)
Anesthesia Evaluation  ?Patient identified by MRN, date of birth, ID band ?Patient awake ? ? ? ?Reviewed: ?Allergy & Precautions, NPO status , Patient's Chart, lab work & pertinent test results ? ?History of Anesthesia Complications ?Negative for: history of anesthetic complications ? ?Airway ?Mallampati: II ? ?TM Distance: >3 FB ?Neck ROM: Full ? ? ? Dental ? ?(+) Partial Upper, Dental Advisory Given, Missing ?  ?Pulmonary ?sleep apnea and Continuous Positive Airway Pressure Ventilation ,  ?  ?breath sounds clear to auscultation ? ? ? ? ? ? Cardiovascular ?hypertension, Pt. on medications ?+ CAD (single vessel Cx), + Cardiac Stents (DES Cx) and + Peripheral Vascular Disease  ?+ dysrhythmias Atrial Fibrillation  ?Rhythm:Irregular Rate:Normal ? ? ?  ?Neuro/Psych ? Headaches, CVA, No Residual Symptoms   ? GI/Hepatic ?negative GI ROS, Neg liver ROS,   ?Endo/Other  ?Hypothyroidism Morbid obesity ? Renal/GU ?negative Renal ROS  ? ?  ?Musculoskeletal ? ?(+) Arthritis , Osteoarthritis,   ? Abdominal ?(+) + obese,   ?Peds ? Hematology ?Eliquis: last dose 05/07/2021 ?Brilinta: last dose 05/03/2021 ?   ?Anesthesia Other Findings ? ? Reproductive/Obstetrics ? ?  ? ? ? ? ? ? ? ? ? ? ? ? ? ?  ?  ? ? ? ? ? ?Anesthesia Physical ?Anesthesia Plan ? ?ASA: 3 ? ?Anesthesia Plan: Spinal  ? ?Post-op Pain Management: Tylenol PO (pre-op)*  ? ?Induction: Intravenous ? ?PONV Risk Score and Plan: 2 and Ondansetron, Dexamethasone and Scopolamine patch - Pre-op ? ?Airway Management Planned: Natural Airway and Simple Face Mask ? ?Additional Equipment: None ? ?Intra-op Plan:  ? ?Post-operative Plan:  ? ?Informed Consent: I have reviewed the patients History and Physical, chart, labs and discussed the procedure including the risks, benefits and alternatives for the proposed anesthesia with the patient or authorized representative who has indicated his/her understanding and acceptance.  ? ? ? ?Dental advisory  given ? ?Plan Discussed with: CRNA and Surgeon ? ?Anesthesia Plan Comments: (PAT note by Karoline Caldwell, PA-C: ?Follows with cardiology for hx of CAD s/p PCI/DES to LCx 2020, hypertension, carotid stenosis s/p stent to left proximal ICA 10/2020 (known CTO of [facility]), stroke, PAF,?SAH on aspirin and Plavix.  ? ?Pt seen by cardiology Christen Bame, NP via televisit 04/23/21 for preop eval. Per note, "Preoperative Cardiovascular Risk Assessment: She is doing well from a cardiac perspective and may proceed to surgery without further cardiac testing.?According to the Revised Cardiac Risk Index (RCRI),?her?Perioperative Risk of Major Cardiac Event is (%): 11.?Her?Functional Capacity in METs is: 6.05?according to the Duke Activity Status Index (DASI)." Note also outlines recommendations to stop Eliquis 4/27 (AM dose last dose) and start Lovenox that PM. Last Lovenox 4/29 PM.  ? ?Dr. Estanislado Pandy cleared the pt to hold Brilinta. Per Telephone encounter by Soyla Dryer, NP on 04/29/21, "Patient scheduled for orthopedic procedure with Dr. Eduard Roux 05/11/21. [facility] contacted regarding patient's antiplatelet regimen with a request to hold prior to procedure. Dr. Estanislado Pandy states it's ok to stop Brilinta but not 81 mg aspirin. Dr. Estanislado Pandy states the patient must remain on at least 81 mg aspirin daily." Dr. Erlinda Hong confirmed okay for pt to continue ASA. ? ?OSA on CPAP. ? ?Preop labs reviewed, unremarkable. ? ?EKG 11/06/20: Atrial fibrillation. Rate 81. Nonspecific T wave abnormality ? ?Carotid duplex 04/08/21: ?Summary:  ?Right Carotid: Evidence consistent with a total occlusion of the right  ?ICA.  ? ?Left Carotid: Left ICA stent: 50-75% stenosis.  ? ?Vertebrals: Bilateral vertebral arteries demonstrate antegrade flow.  ? ?TTE  03/17/20: ??1. Left ventricular ejection fraction, by estimation, is 60 to 65%. The  ?left ventricle has normal function. The left ventricle has no regional  ?wall motion abnormalities. There is mild concentric left  ventricular  ?hypertrophy. Left ventricular diastolic  ?parameters are consistent with Grade I diastolic dysfunction (impaired  ?relaxation).  ??2. Right ventricular systolic function is normal. The right ventricular  ?size is mildly enlarged. There is mildly elevated pulmonary artery  ?systolic pressure. The estimated right ventricular systolic pressure is  ?[id number] mmHg.  ??3. The mitral valve is grossly normal. Mild mitral valve regurgitation.  ??4. Tricuspid valve regurgitation is mild to moderate.  ??5. There is flow acceleration noted in the LVOT without evidence of SAM.  ?Likely related to LVOT narrowing during systole in the setting of septal  ?hypertrophy. Flow acceleration occurs prior to the aortic valve with no  ?evidence of aortic stenosis. DI  ?0.7.  ??6. The aortic valve is tricuspid. There is mild calcification of the  ?aortic valve. There is mild thickening of the aortic valve. Aortic valve  ?regurgitation is mild. Mild aortic valve sclerosis is present, with no  ?evidence of aortic valve stenosis.  ??7. The inferior vena cava is normal in size with greater than 50%  ?respiratory variability, suggesting right atrial pressure of 3 mmHg.  ? ?Comparison(s): Compared to prior TTE in 08/08/19, the flow acceleration  ?across the LVOT and degree of TR is better captured on the current study.  ?Otherwise, there is no significant change.  ? ?Cath and PCI 07/12/18: ?SUMMARY ?? Severe Single Vessel CAD mLCX (@ OM2) - s/p Successful DES PCI (Synergy DES 3.0 x 20 -- 3.3 mm) ?? Otherwise mild disease in LAD & RCA ?? Normal LVEF & normal to low EDP ?? ?RECOMMENDATIONS ?? Overnight monitoring post-PCI with TR Band Removal  ?? Continue aggressive Risk Factor Modification ? ?)  ? ? ? ?Anesthesia Quick Evaluation ? ?

## 2021-05-04 ENCOUNTER — Other Ambulatory Visit: Payer: Self-pay | Admitting: Physician Assistant

## 2021-05-04 MED ORDER — ONDANSETRON HCL 4 MG PO TABS: 4.0000 mg | ORAL_TABLET | Freq: Three times a day (TID) | ORAL | 0 refills | Status: DC | PRN

## 2021-05-04 MED ORDER — OXYCODONE-ACETAMINOPHEN 5-325 MG PO TABS: 1.0000 | ORAL_TABLET | Freq: Four times a day (QID) | ORAL | 0 refills | Status: DC | PRN

## 2021-05-04 MED ORDER — DOCUSATE SODIUM 100 MG PO CAPS: 100.0000 mg | ORAL_CAPSULE | Freq: Every day | ORAL | 2 refills | Status: DC | PRN

## 2021-05-04 MED ORDER — METHOCARBAMOL 500 MG PO TABS: 500.0000 mg | ORAL_TABLET | Freq: Two times a day (BID) | ORAL | 2 refills | Status: DC | PRN

## 2021-05-06 ENCOUNTER — Telehealth: Payer: Self-pay | Admitting: Orthopaedic Surgery

## 2021-05-06 NOTE — Telephone Encounter (Signed)
Pt is dropping off $25.00 check #[id number]. And signed Release form ?

## 2021-05-08 ENCOUNTER — Telehealth: Payer: Self-pay | Admitting: Orthopaedic Surgery

## 2021-05-08 MED ORDER — TRANEXAMIC ACID 1000 MG/10ML IV SOLN
2000.0000 mg | INTRAVENOUS | Status: DC
  Filled 2021-05-08: qty 20

## 2021-05-08 NOTE — Telephone Encounter (Signed)
[employer] forms received. To [employer]. 

## 2021-05-11 ENCOUNTER — Observation Stay (HOSPITAL_COMMUNITY): Payer: 59

## 2021-05-11 ENCOUNTER — Other Ambulatory Visit: Payer: Self-pay

## 2021-05-11 ENCOUNTER — Ambulatory Visit (HOSPITAL_COMMUNITY): Payer: 59 | Admitting: Vascular Surgery

## 2021-05-11 ENCOUNTER — Encounter (HOSPITAL_COMMUNITY)
Admission: RE | Disposition: A | Discharge status: Home Health | Payer: Self-pay | Source: Home / Self Care | Attending: Orthopaedic Surgery

## 2021-05-11 ENCOUNTER — Observation Stay (HOSPITAL_COMMUNITY)
Admission: RE | Admit: 2021-05-11 | Discharge: 2021-05-12 | Disposition: A | Discharge status: Home Health | Payer: 59 | Attending: Orthopaedic Surgery | Admitting: Orthopaedic Surgery

## 2021-05-11 ENCOUNTER — Encounter (HOSPITAL_COMMUNITY): Payer: Self-pay | Admitting: Orthopaedic Surgery

## 2021-05-11 ENCOUNTER — Ambulatory Visit (HOSPITAL_BASED_OUTPATIENT_CLINIC_OR_DEPARTMENT_OTHER): Payer: 59 | Admitting: Vascular Surgery

## 2021-05-11 ENCOUNTER — Ambulatory Visit (HOSPITAL_COMMUNITY): Payer: 59

## 2021-05-11 DIAGNOSIS — Z7982 Long term (current) use of aspirin: Secondary | ICD-10-CM | POA: Diagnosis not present

## 2021-05-11 DIAGNOSIS — I252 Old myocardial infarction: Secondary | ICD-10-CM | POA: Insufficient documentation

## 2021-05-11 DIAGNOSIS — I2510 Atherosclerotic heart disease of native coronary artery without angina pectoris: Secondary | ICD-10-CM | POA: Diagnosis not present

## 2021-05-11 DIAGNOSIS — Z79899 Other long term (current) drug therapy: Secondary | ICD-10-CM | POA: Insufficient documentation

## 2021-05-11 DIAGNOSIS — Z7901 Long term (current) use of anticoagulants: Secondary | ICD-10-CM | POA: Insufficient documentation

## 2021-05-11 DIAGNOSIS — M1612 Unilateral primary osteoarthritis, left hip: Secondary | ICD-10-CM

## 2021-05-11 DIAGNOSIS — I10 Essential (primary) hypertension: Secondary | ICD-10-CM | POA: Insufficient documentation

## 2021-05-11 DIAGNOSIS — I4891 Unspecified atrial fibrillation: Secondary | ICD-10-CM | POA: Diagnosis not present

## 2021-05-11 DIAGNOSIS — Z01818 Encounter for other preprocedural examination: Secondary | ICD-10-CM

## 2021-05-11 DIAGNOSIS — Z96642 Presence of left artificial hip joint: Secondary | ICD-10-CM

## 2021-05-11 HISTORY — PX: TOTAL HIP ARTHROPLASTY: SHX124

## 2021-05-11 LAB — SURGICAL PCR SCREEN
MRSA, PCR: NEGATIVE
Staphylococcus aureus: NEGATIVE

## 2021-05-11 IMAGING — DX DG PORTABLE PELVIS
1 series · 2 of 2 positions shown · non-contrast
Comparison: [DATE]

CLINICAL DATA: Postop, hip arthroplasty

EXAM:
PORTABLE PELVIS 1-2 VIEWS

[Series 1: pelvis · 0.14mm/px · 2 of 2 slices shown]
[im 1/2]
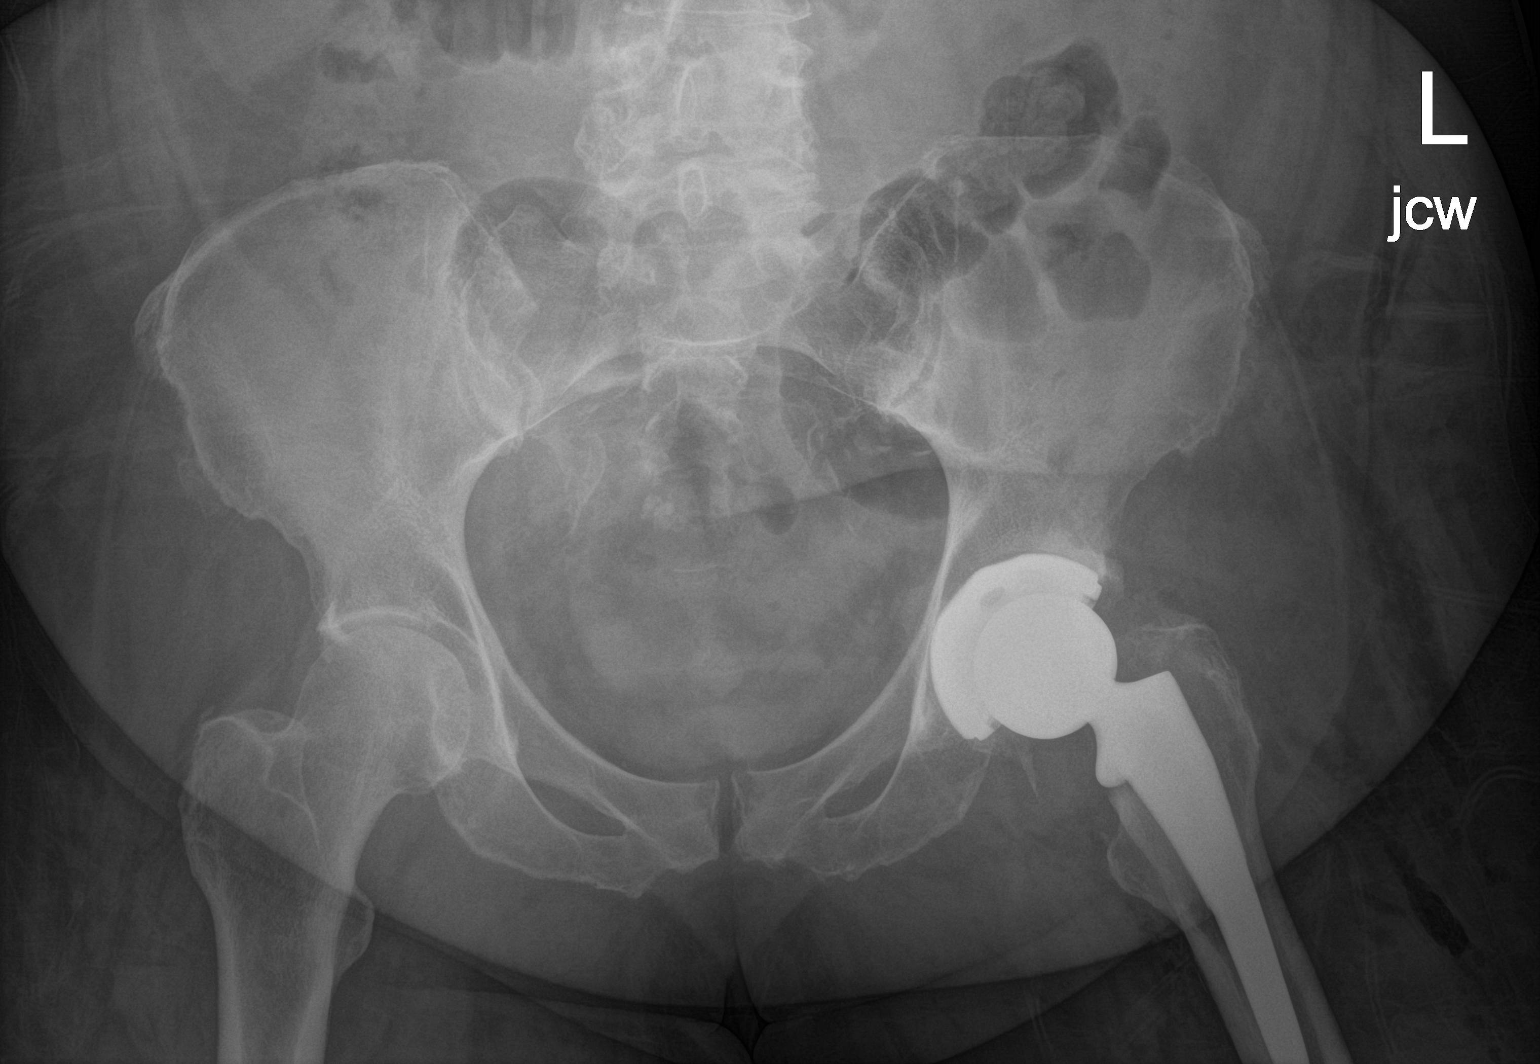
[im 2/2]
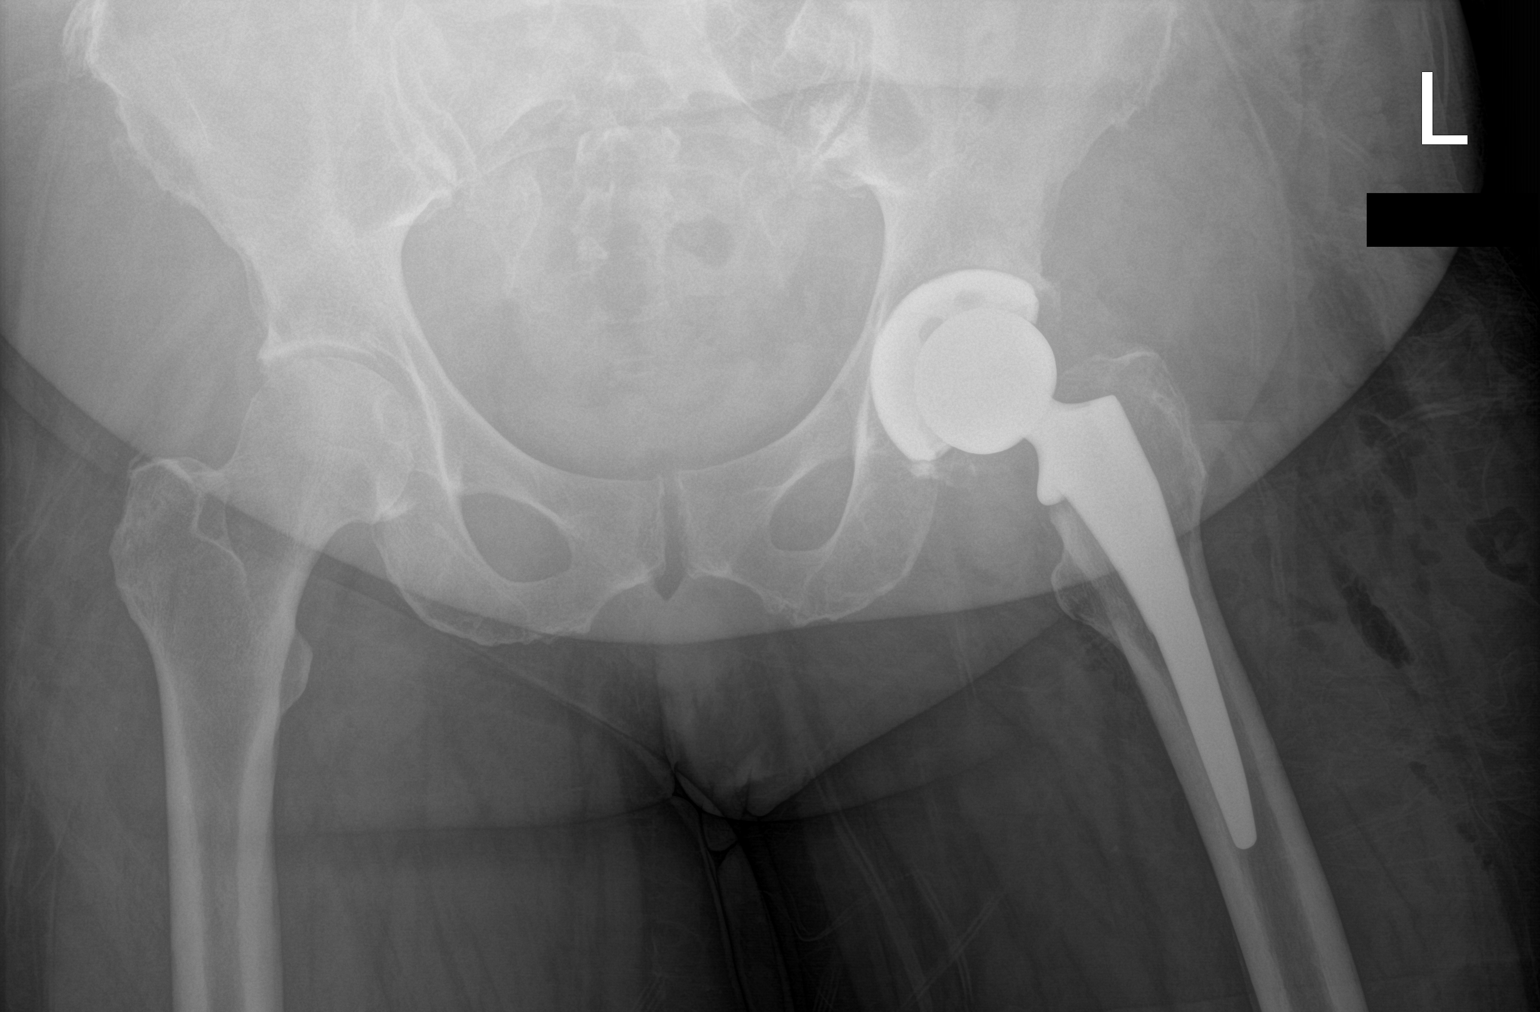

[2 of 2 positions shown; findings below may reference images not displayed]

FINDINGS: Total left hip replacement is identified without malalignment. Mild
degenerative joint changes of the right hip with narrow hip joint
space noted.
IMPRESSION: Total left hip replacement without malalignment.

## 2021-05-11 IMAGING — RF DG HIP (WITH OR WITHOUT PELVIS) 1V*L*
1 series · 4 of 4 positions shown · non-contrast
Comparison: [DATE]

CLINICAL DATA: Fluoroscopic assistance for left hip arthroplasty

EXAM:
DG HIP (WITH OR WITHOUT PELVIS) 1V*L*

[Series 1: run · 4 of 4 slices shown]
[im 1/4]
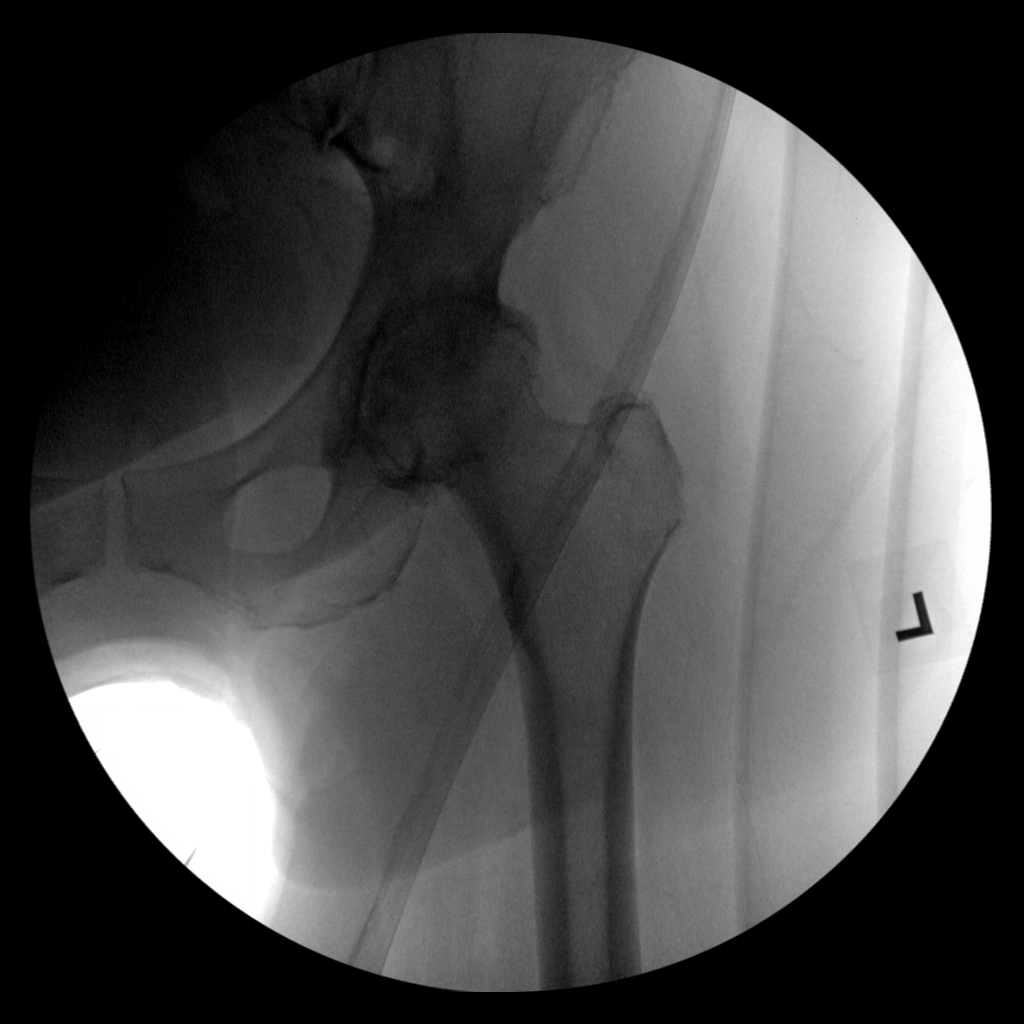
[im 2/4]
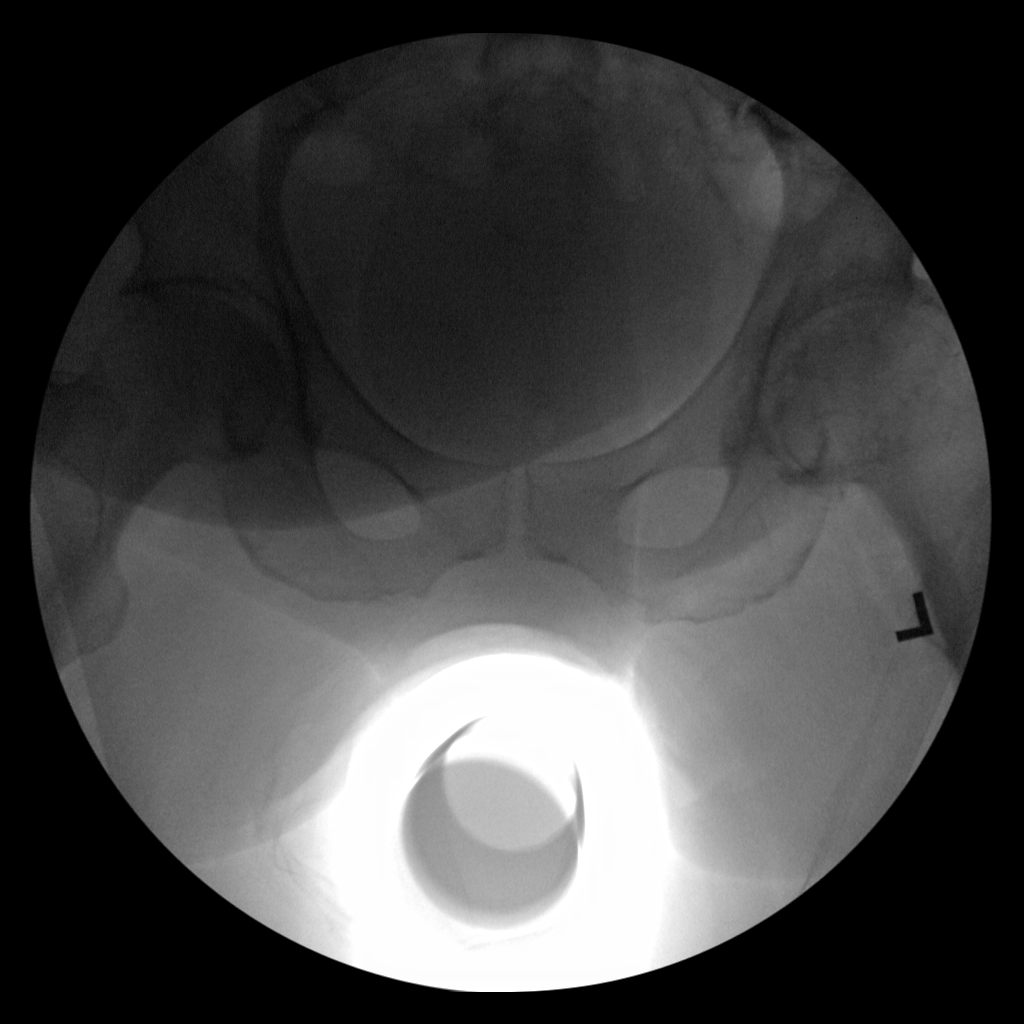
[im 3/4]
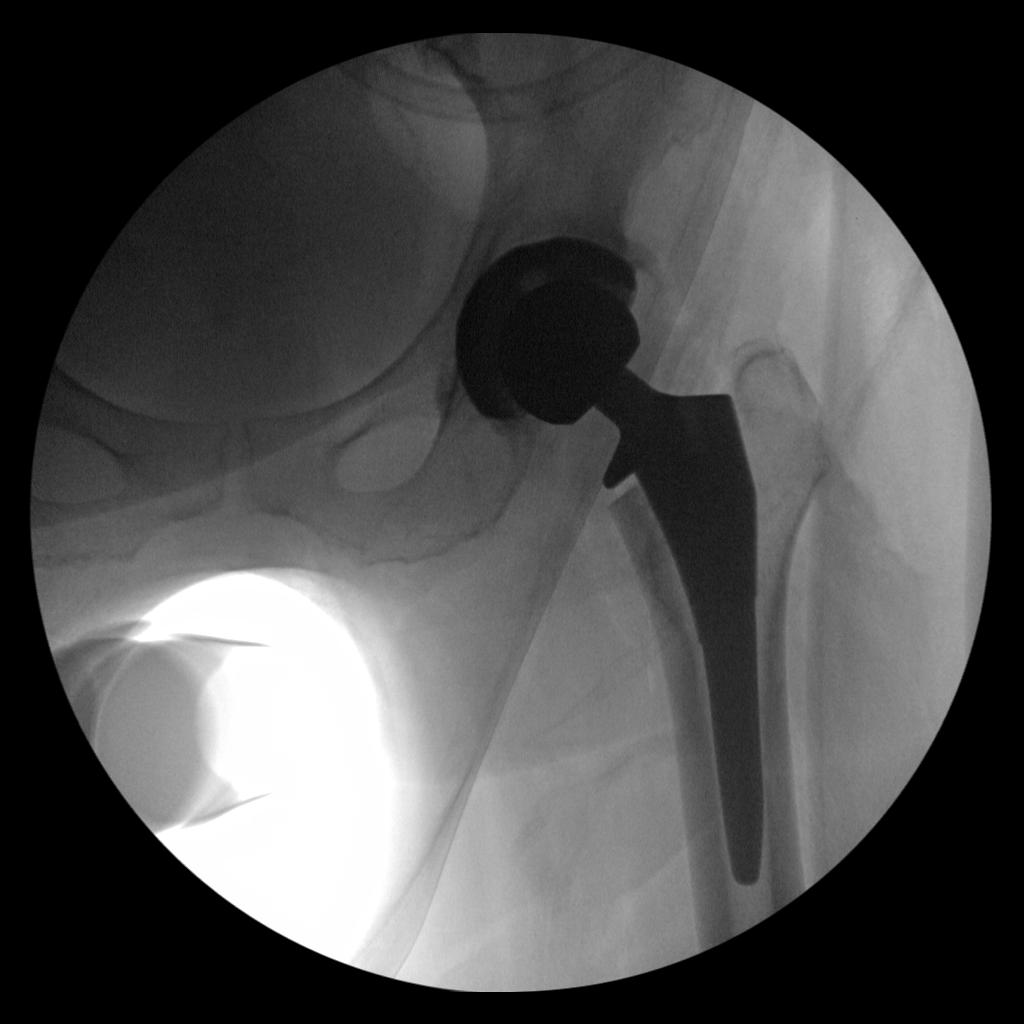
[im 4/4]
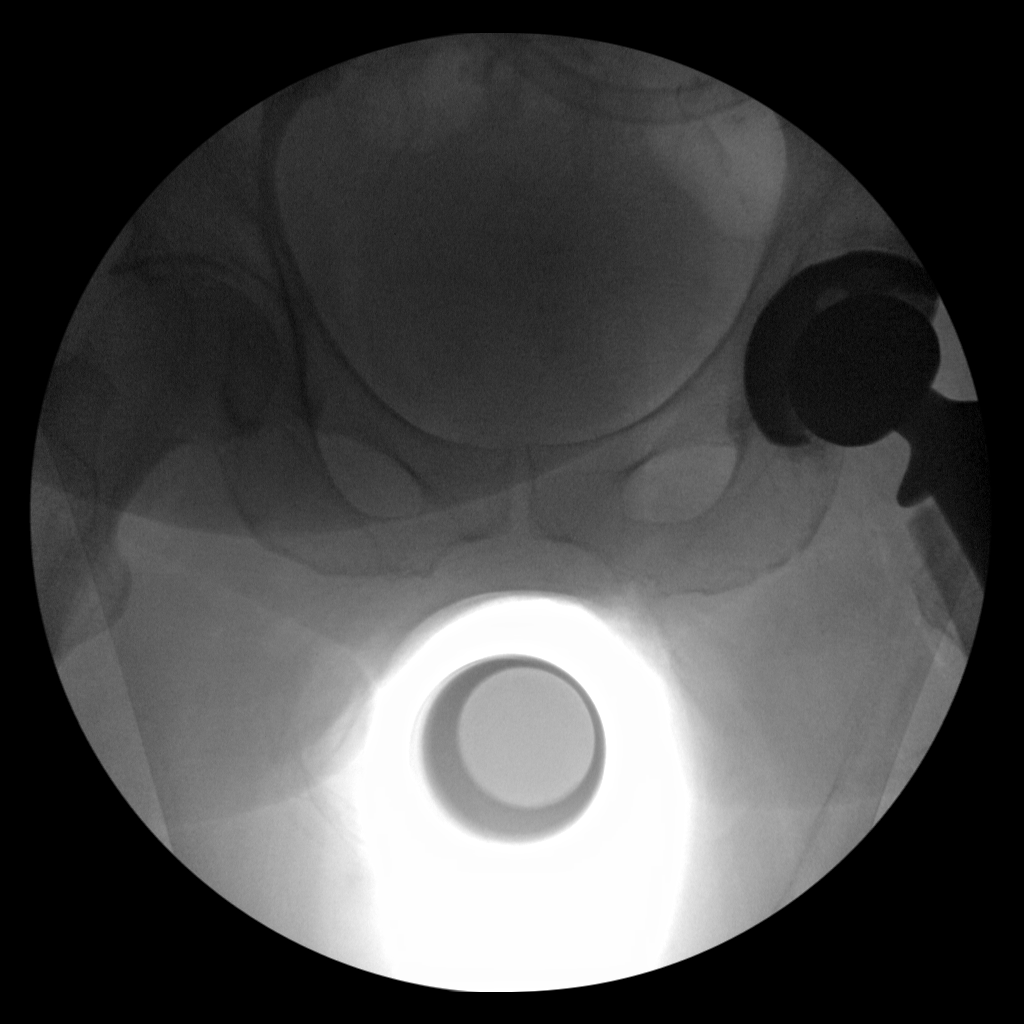

[4 of 4 positions shown; findings below may reference images not displayed]

FINDINGS: Fluoroscopic images show interval left hip arthroplasty.
Fluoroscopic time was 44 seconds. Radiation dose was 7.78 mGy.
IMPRESSION: Fluoroscopic assistance was provided for left hip arthroplasty.

## 2021-05-11 SURGERY — ARTHROPLASTY, HIP, TOTAL, ANTERIOR APPROACH
Anesthesia: Spinal | Site: Hip | Laterality: Left

## 2021-05-11 MED ORDER — LIDOCAINE 2% (20 MG/ML) 5 ML SYRINGE
INTRAMUSCULAR | Status: AC
  Filled 2021-05-11: qty 5

## 2021-05-11 MED ORDER — ROCURONIUM BROMIDE 10 MG/ML (PF) SYRINGE
PREFILLED_SYRINGE | INTRAVENOUS | Status: AC
  Filled 2021-05-11: qty 10

## 2021-05-11 MED ORDER — MEPERIDINE HCL 25 MG/ML IJ SOLN: 6.2500 mg | INTRAMUSCULAR | Status: DC | PRN

## 2021-05-11 MED ORDER — PHENOL 1.4 % MT LIQD: 1.0000 | OROMUCOSAL | Status: DC | PRN

## 2021-05-11 MED ORDER — BUPIVACAINE IN DEXTROSE 0.75-8.25 % IT SOLN
INTRATHECAL | Status: DC | PRN
  Administered 2021-05-11: 12 mg via INTRATHECAL

## 2021-05-11 MED ORDER — SCOPOLAMINE 1 MG/3DAYS TD PT72
1.0000 | MEDICATED_PATCH | TRANSDERMAL | Status: DC
  Administered 2021-05-11: 1.5 mg via TRANSDERMAL
  Filled 2021-05-11: qty 1

## 2021-05-11 MED ORDER — LACTATED RINGERS IV SOLN: INTRAVENOUS | Status: DC

## 2021-05-11 MED ORDER — FENTANYL CITRATE (PF) 250 MCG/5ML IJ SOLN
INTRAMUSCULAR | Status: DC | PRN
  Administered 2021-05-11: 50 ug via INTRAVENOUS

## 2021-05-11 MED ORDER — PROPOFOL 500 MG/50ML IV EMUL
INTRAVENOUS | Status: DC | PRN
  Administered 2021-05-11: 50 ug/kg/min via INTRAVENOUS

## 2021-05-11 MED ORDER — PROPOFOL 1000 MG/100ML IV EMUL
INTRAVENOUS | Status: AC
  Filled 2021-05-11: qty 100

## 2021-05-11 MED ORDER — HYDROMORPHONE HCL 1 MG/ML IJ SOLN
0.2500 mg | INTRAMUSCULAR | Status: DC | PRN
  Administered 2021-05-11: 0.25 mg via INTRAVENOUS

## 2021-05-11 MED ORDER — SODIUM CHLORIDE 0.9 % IV SOLN: INTRAVENOUS | Status: DC

## 2021-05-11 MED ORDER — PHENYLEPHRINE 80 MCG/ML (10ML) SYRINGE FOR IV PUSH (FOR BLOOD PRESSURE SUPPORT)
PREFILLED_SYRINGE | INTRAVENOUS | Status: AC
  Filled 2021-05-11: qty 10

## 2021-05-11 MED ORDER — CEFAZOLIN SODIUM-DEXTROSE 2-4 GM/100ML-% IV SOLN
INTRAVENOUS | Status: AC
  Filled 2021-05-11: qty 100

## 2021-05-11 MED ORDER — METHOCARBAMOL 500 MG PO TABS
500.0000 mg | ORAL_TABLET | Freq: Four times a day (QID) | ORAL | Status: DC | PRN
  Administered 2021-05-11 – 2021-05-12 (×2): 500 mg via ORAL
  Filled 2021-05-11 (×2): qty 1

## 2021-05-11 MED ORDER — VANCOMYCIN HCL 1 G IV SOLR
INTRAVENOUS | Status: DC | PRN
  Administered 2021-05-11: 1000 mg

## 2021-05-11 MED ORDER — DEXAMETHASONE SODIUM PHOSPHATE 10 MG/ML IJ SOLN
10.0000 mg | Freq: Once | INTRAMUSCULAR | Status: AC
  Administered 2021-05-12: 10 mg via INTRAVENOUS
  Filled 2021-05-11: qty 1

## 2021-05-11 MED ORDER — METOCLOPRAMIDE HCL 5 MG/ML IJ SOLN: 5.0000 mg | Freq: Three times a day (TID) | INTRAMUSCULAR | Status: DC | PRN

## 2021-05-11 MED ORDER — ONDANSETRON HCL 4 MG PO TABS: 4.0000 mg | ORAL_TABLET | Freq: Four times a day (QID) | ORAL | Status: DC | PRN

## 2021-05-11 MED ORDER — BUPIVACAINE-MELOXICAM ER 400-12 MG/14ML IJ SOLN
INTRAMUSCULAR | Status: DC | PRN
  Administered 2021-05-11: 400 mg

## 2021-05-11 MED ORDER — ONDANSETRON HCL 4 MG/2ML IJ SOLN
INTRAMUSCULAR | Status: DC | PRN
Start: 2021-05-11 — End: 2021-05-11
  Administered 2021-05-11: 4 mg via INTRAVENOUS

## 2021-05-11 MED ORDER — OXYCODONE HCL 5 MG PO TABS
5.0000 mg | ORAL_TABLET | ORAL | Status: DC | PRN
  Administered 2021-05-11 – 2021-05-12 (×2): 10 mg via ORAL
  Filled 2021-05-11 (×2): qty 2

## 2021-05-11 MED ORDER — CEFAZOLIN SODIUM-DEXTROSE 2-4 GM/100ML-% IV SOLN
2.0000 g | INTRAVENOUS | Status: AC
  Administered 2021-05-11: 2 g via INTRAVENOUS
  Filled 2021-05-11: qty 100

## 2021-05-11 MED ORDER — PANTOPRAZOLE SODIUM 40 MG PO TBEC
40.0000 mg | DELAYED_RELEASE_TABLET | Freq: Every day | ORAL | Status: DC
  Administered 2021-05-11 – 2021-05-12 (×2): 40 mg via ORAL
  Filled 2021-05-11 (×2): qty 1

## 2021-05-11 MED ORDER — DEXAMETHASONE SODIUM PHOSPHATE 10 MG/ML IJ SOLN
INTRAMUSCULAR | Status: DC | PRN
Start: 2021-05-11 — End: 2021-05-11
  Administered 2021-05-11: 5 mg via INTRAVENOUS

## 2021-05-11 MED ORDER — PRONTOSAN WOUND IRRIGATION OPTIME
TOPICAL | Status: DC | PRN
  Administered 2021-05-11: 1 via TOPICAL

## 2021-05-11 MED ORDER — POVIDONE-IODINE 10 % EX SWAB
2.0000 "application " | Freq: Once | CUTANEOUS | Status: AC
  Administered 2021-05-11: 2 via TOPICAL

## 2021-05-11 MED ORDER — CEFAZOLIN SODIUM-DEXTROSE 2-4 GM/100ML-% IV SOLN
2.0000 g | Freq: Four times a day (QID) | INTRAVENOUS | Status: AC
  Administered 2021-05-11 (×2): 2 g via INTRAVENOUS
  Filled 2021-05-11 (×2): qty 100

## 2021-05-11 MED ORDER — FENTANYL CITRATE (PF) 250 MCG/5ML IJ SOLN
INTRAMUSCULAR | Status: AC
  Filled 2021-05-11: qty 5

## 2021-05-11 MED ORDER — TRANEXAMIC ACID-NACL 1000-0.7 MG/100ML-% IV SOLN
1000.0000 mg | Freq: Once | INTRAVENOUS | Status: AC
  Administered 2021-05-11: 1000 mg via INTRAVENOUS
  Filled 2021-05-11: qty 100

## 2021-05-11 MED ORDER — OXYCODONE HCL 5 MG PO TABS: 5.0000 mg | ORAL_TABLET | Freq: Once | ORAL | Status: DC | PRN

## 2021-05-11 MED ORDER — ALBUMIN HUMAN 5 % IV SOLN: INTRAVENOUS | Status: DC | PRN

## 2021-05-11 MED ORDER — VANCOMYCIN HCL 1000 MG IV SOLR
INTRAVENOUS | Status: AC
  Filled 2021-05-11: qty 20

## 2021-05-11 MED ORDER — GLYCOPYRROLATE PF 0.2 MG/ML IJ SOSY
PREFILLED_SYRINGE | INTRAMUSCULAR | Status: AC
  Filled 2021-05-11: qty 1

## 2021-05-11 MED ORDER — TOPIRAMATE 100 MG PO TABS
100.0000 mg | ORAL_TABLET | Freq: Every day | ORAL | Status: DC
  Administered 2021-05-11: 100 mg via ORAL
  Filled 2021-05-11: qty 1

## 2021-05-11 MED ORDER — GLYCOPYRROLATE PF 0.2 MG/ML IJ SOSY
PREFILLED_SYRINGE | INTRAMUSCULAR | Status: DC | PRN
  Administered 2021-05-11: .2 mg via INTRAVENOUS

## 2021-05-11 MED ORDER — APIXABAN 5 MG PO TABS
5.0000 mg | ORAL_TABLET | Freq: Two times a day (BID) | ORAL | Status: DC
  Administered 2021-05-12: 5 mg via ORAL
  Filled 2021-05-11 (×2): qty 1

## 2021-05-11 MED ORDER — DIPHENHYDRAMINE HCL 12.5 MG/5ML PO ELIX: 25.0000 mg | ORAL_SOLUTION | ORAL | Status: DC | PRN

## 2021-05-11 MED ORDER — ACETAMINOPHEN 325 MG PO TABS: 325.0000 mg | ORAL_TABLET | Freq: Four times a day (QID) | ORAL | Status: DC | PRN

## 2021-05-11 MED ORDER — MIDAZOLAM HCL 2 MG/2ML IJ SOLN: 0.5000 mg | Freq: Once | INTRAMUSCULAR | Status: DC | PRN

## 2021-05-11 MED ORDER — DILTIAZEM HCL ER COATED BEADS 120 MG PO CP24
120.0000 mg | ORAL_CAPSULE | Freq: Every day | ORAL | Status: DC
  Administered 2021-05-11 – 2021-05-12 (×2): 120 mg via ORAL
  Filled 2021-05-11 (×2): qty 1

## 2021-05-11 MED ORDER — ACETAMINOPHEN 500 MG PO TABS
1000.0000 mg | ORAL_TABLET | Freq: Once | ORAL | Status: DC
  Filled 2021-05-11: qty 2

## 2021-05-11 MED ORDER — METHOCARBAMOL 1000 MG/10ML IJ SOLN
500.0000 mg | Freq: Four times a day (QID) | INTRAVENOUS | Status: DC | PRN
  Filled 2021-05-11: qty 5

## 2021-05-11 MED ORDER — CHLORHEXIDINE GLUCONATE 0.12 % MT SOLN
OROMUCOSAL | Status: AC
  Filled 2021-05-11: qty 15

## 2021-05-11 MED ORDER — MIDAZOLAM HCL 2 MG/2ML IJ SOLN
INTRAMUSCULAR | Status: AC
  Filled 2021-05-11: qty 2

## 2021-05-11 MED ORDER — SORBITOL 70 % SOLN: 30.0000 mL | Freq: Every day | Status: DC | PRN

## 2021-05-11 MED ORDER — ALUM & MAG HYDROXIDE-SIMETH 200-200-20 MG/5ML PO SUSP: 30.0000 mL | ORAL | Status: DC | PRN

## 2021-05-11 MED ORDER — LIDOCAINE 2% (20 MG/ML) 5 ML SYRINGE
INTRAMUSCULAR | Status: DC | PRN
Start: 2021-05-11 — End: 2021-05-11
  Administered 2021-05-11 (×2): 20 mg via INTRAVENOUS

## 2021-05-11 MED ORDER — DEXAMETHASONE SODIUM PHOSPHATE 10 MG/ML IJ SOLN
INTRAMUSCULAR | Status: AC
  Filled 2021-05-11: qty 1

## 2021-05-11 MED ORDER — PHENYLEPHRINE 80 MCG/ML (10ML) SYRINGE FOR IV PUSH (FOR BLOOD PRESSURE SUPPORT)
PREFILLED_SYRINGE | INTRAVENOUS | Status: DC | PRN
  Administered 2021-05-11 (×7): 80 ug via INTRAVENOUS

## 2021-05-11 MED ORDER — 0.9 % SODIUM CHLORIDE (POUR BTL) OPTIME
TOPICAL | Status: DC | PRN
  Administered 2021-05-11: 1000 mL

## 2021-05-11 MED ORDER — TRANEXAMIC ACID 1000 MG/10ML IV SOLN
INTRAVENOUS | Status: DC | PRN
  Administered 2021-05-11: 2000 mg via TOPICAL

## 2021-05-11 MED ORDER — DOCUSATE SODIUM 100 MG PO CAPS
100.0000 mg | ORAL_CAPSULE | Freq: Two times a day (BID) | ORAL | Status: DC
  Administered 2021-05-11 – 2021-05-12 (×3): 100 mg via ORAL
  Filled 2021-05-11 (×3): qty 1

## 2021-05-11 MED ORDER — ACETAMINOPHEN 500 MG PO TABS
1000.0000 mg | ORAL_TABLET | Freq: Four times a day (QID) | ORAL | Status: AC
  Administered 2021-05-11 – 2021-05-12 (×4): 1000 mg via ORAL
  Filled 2021-05-11 (×4): qty 2

## 2021-05-11 MED ORDER — ONDANSETRON HCL 4 MG/2ML IJ SOLN: 4.0000 mg | Freq: Four times a day (QID) | INTRAMUSCULAR | Status: DC | PRN

## 2021-05-11 MED ORDER — ONDANSETRON HCL 4 MG/2ML IJ SOLN
INTRAMUSCULAR | Status: AC
  Filled 2021-05-11: qty 2

## 2021-05-11 MED ORDER — OXYCODONE HCL 5 MG PO TABS
10.0000 mg | ORAL_TABLET | ORAL | Status: DC | PRN
  Administered 2021-05-12: 10 mg via ORAL
  Filled 2021-05-11: qty 2

## 2021-05-11 MED ORDER — OXYCODONE HCL 5 MG/5ML PO SOLN: 5.0000 mg | Freq: Once | ORAL | Status: DC | PRN

## 2021-05-11 MED ORDER — MIDAZOLAM HCL 2 MG/2ML IJ SOLN
INTRAMUSCULAR | Status: DC | PRN
  Administered 2021-05-11: 2 mg via INTRAVENOUS

## 2021-05-11 MED ORDER — OXYCODONE HCL ER 10 MG PO T12A
10.0000 mg | EXTENDED_RELEASE_TABLET | Freq: Two times a day (BID) | ORAL | Status: DC
  Administered 2021-05-11 – 2021-05-12 (×3): 10 mg via ORAL
  Filled 2021-05-11 (×3): qty 1

## 2021-05-11 MED ORDER — POLYETHYLENE GLYCOL 3350 17 G PO PACK
17.0000 g | PACK | Freq: Every day | ORAL | Status: DC
  Administered 2021-05-11 – 2021-05-12 (×2): 17 g via ORAL
  Filled 2021-05-11 (×2): qty 1

## 2021-05-11 MED ORDER — PROPOFOL 10 MG/ML IV BOLUS
INTRAVENOUS | Status: AC
  Filled 2021-05-11: qty 20

## 2021-05-11 MED ORDER — PROPOFOL 10 MG/ML IV BOLUS
INTRAVENOUS | Status: DC | PRN
  Administered 2021-05-11: 30 mg via INTRAVENOUS
  Administered 2021-05-11 (×2): 50 mg via INTRAVENOUS

## 2021-05-11 MED ORDER — BUPIVACAINE-MELOXICAM ER 400-12 MG/14ML IJ SOLN
INTRAMUSCULAR | Status: AC
  Filled 2021-05-11: qty 1

## 2021-05-11 MED ORDER — SODIUM CHLORIDE 0.9 % IR SOLN
Status: DC | PRN
  Administered 2021-05-11: 1000 mL

## 2021-05-11 MED ORDER — METOCLOPRAMIDE HCL 5 MG PO TABS: 5.0000 mg | ORAL_TABLET | Freq: Three times a day (TID) | ORAL | Status: DC | PRN

## 2021-05-11 MED ORDER — MENTHOL 3 MG MT LOZG: 1.0000 | LOZENGE | OROMUCOSAL | Status: DC | PRN

## 2021-05-11 MED ORDER — FERROUS SULFATE 325 (65 FE) MG PO TABS
325.0000 mg | ORAL_TABLET | Freq: Three times a day (TID) | ORAL | Status: DC
  Administered 2021-05-11 – 2021-05-12 (×3): 325 mg via ORAL
  Filled 2021-05-11 (×3): qty 1

## 2021-05-11 MED ORDER — HYDROMORPHONE HCL 1 MG/ML IJ SOLN: 0.5000 mg | INTRAMUSCULAR | Status: DC | PRN

## 2021-05-11 MED ORDER — TRANEXAMIC ACID-NACL 1000-0.7 MG/100ML-% IV SOLN
1000.0000 mg | INTRAVENOUS | Status: AC
  Administered 2021-05-11: 1000 mg via INTRAVENOUS
  Filled 2021-05-11: qty 100

## 2021-05-11 MED ORDER — HYDROMORPHONE HCL 1 MG/ML IJ SOLN
INTRAMUSCULAR | Status: AC
  Filled 2021-05-11: qty 1

## 2021-05-11 MED ORDER — PHENYLEPHRINE HCL-NACL 20-0.9 MG/250ML-% IV SOLN
INTRAVENOUS | Status: DC | PRN
  Administered 2021-05-11: 25 ug/min via INTRAVENOUS

## 2021-05-11 SURGICAL SUPPLY — 64 items
BAG COUNTER SPONGE SURGICOUNT (BAG) ×2 IMPLANT
BAG DECANTER FOR FLEXI CONT (MISCELLANEOUS) ×2 IMPLANT
BAG SPNG CNTER NS LX DISP (BAG) ×1
CELLS DAT CNTRL 66122 CELL SVR (MISCELLANEOUS) IMPLANT
COVER PERINEAL POST (MISCELLANEOUS) ×2 IMPLANT
COVER SURGICAL LIGHT HANDLE (MISCELLANEOUS) ×2 IMPLANT
DRAPE C-ARM 42X72 X-RAY (DRAPES) ×2 IMPLANT
DRAPE POUCH INSTRU U-SHP 10X18 (DRAPES) ×2 IMPLANT
DRAPE STERI IOBAN 125X83 (DRAPES) ×2 IMPLANT
DRAPE U-SHAPE 47X51 STRL (DRAPES) ×4 IMPLANT
DRSG AQUACEL AG ADV 3.5X10 (GAUZE/BANDAGES/DRESSINGS) ×2 IMPLANT
DURAPREP 26ML APPLICATOR (WOUND CARE) ×4 IMPLANT
ELECT BLADE 4.0 EZ CLEAN MEGAD (MISCELLANEOUS) ×2
ELECT REM PT RETURN 9FT ADLT (ELECTROSURGICAL) ×2
ELECTRODE BLDE 4.0 EZ CLN MEGD (MISCELLANEOUS) ×1 IMPLANT
ELECTRODE REM PT RTRN 9FT ADLT (ELECTROSURGICAL) ×1 IMPLANT
GLOVE BIOGEL PI IND STRL 7.0 (GLOVE) ×1 IMPLANT
GLOVE BIOGEL PI IND STRL 7.5 (GLOVE) ×4 IMPLANT
GLOVE BIOGEL PI INDICATOR 7.0 (GLOVE) ×1
GLOVE BIOGEL PI INDICATOR 7.5 (GLOVE) ×4
GLOVE ECLIPSE 7.0 STRL STRAW (GLOVE) ×4 IMPLANT
GLOVE SKINSENSE NS SZ7.5 (GLOVE) ×1
GLOVE SKINSENSE STRL SZ7.5 (GLOVE) ×1 IMPLANT
GLOVE SURG UNDER POLY LF SZ7 (GLOVE) ×2 IMPLANT
GLOVE SURG UNDER POLY LF SZ7.5 (GLOVE) ×4 IMPLANT
GOWN STRL REIN XL XLG (GOWN DISPOSABLE) ×2 IMPLANT
GOWN STRL REUS W/ TWL LRG LVL3 (GOWN DISPOSABLE) IMPLANT
GOWN STRL REUS W/ TWL XL LVL3 (GOWN DISPOSABLE) ×1 IMPLANT
GOWN STRL REUS W/TWL LRG LVL3 (GOWN DISPOSABLE)
GOWN STRL REUS W/TWL XL LVL3 (GOWN DISPOSABLE) ×2
HANDPIECE INTERPULSE COAX TIP (DISPOSABLE) ×2
HEAD CERAMIC 36 PLUS5 (Hips) ×1 IMPLANT
HOOD PEEL AWAY FLYTE STAYCOOL (MISCELLANEOUS) ×4 IMPLANT
IV NS IRRIG 3000ML ARTHROMATIC (IV SOLUTION) ×2 IMPLANT
JET LAVAGE IRRISEPT WOUND (IRRIGATION / IRRIGATOR) ×2
KIT BASIN OR (CUSTOM PROCEDURE TRAY) ×2 IMPLANT
LAVAGE JET IRRISEPT WOUND (IRRIGATION / IRRIGATOR) ×1 IMPLANT
LINER NEUTRAL 52X36MM PLUS 4 (Liner) ×1 IMPLANT
MARKER SKIN DUAL TIP RULER LAB (MISCELLANEOUS) ×2 IMPLANT
NDL SPNL 18GX3.5 QUINCKE PK (NEEDLE) ×1 IMPLANT
NEEDLE SPNL 18GX3.5 QUINCKE PK (NEEDLE) ×2 IMPLANT
PACK TOTAL JOINT (CUSTOM PROCEDURE TRAY) ×2 IMPLANT
PACK UNIVERSAL I (CUSTOM PROCEDURE TRAY) ×2 IMPLANT
PIN SECTOR W/GRIP ACE CUP 52MM (Hips) ×1 IMPLANT
RETRACTOR WND ALEXIS 18 MED (MISCELLANEOUS) IMPLANT
RTRCTR WOUND ALEXIS 18CM MED (MISCELLANEOUS)
SAW OSC TIP CART 19.5X105X1.3 (SAW) ×2 IMPLANT
SET HNDPC FAN SPRY TIP SCT (DISPOSABLE) ×1 IMPLANT
STAPLER VISISTAT 35W (STAPLE) IMPLANT
STEM FEM SZ3 STD ACTIS (Stem) ×1 IMPLANT
SUT ETHIBOND 2 V 37 (SUTURE) ×2 IMPLANT
SUT ETHILON 2 0 FS 18 (SUTURE) ×3 IMPLANT
SUT VIC AB 0 CT1 27 (SUTURE) ×2
SUT VIC AB 0 CT1 27XBRD ANBCTR (SUTURE) ×1 IMPLANT
SUT VIC AB 1 CTX 36 (SUTURE) ×2
SUT VIC AB 1 CTX36XBRD ANBCTR (SUTURE) ×1 IMPLANT
SUT VIC AB 2-0 CT1 27 (SUTURE) ×4
SUT VIC AB 2-0 CT1 TAPERPNT 27 (SUTURE) ×2 IMPLANT
SYR 50ML LL SCALE MARK (SYRINGE) ×2 IMPLANT
TOWEL GREEN STERILE (TOWEL DISPOSABLE) ×2 IMPLANT
TRAY CATH 16FR W/PLASTIC CATH (SET/KITS/TRAYS/PACK) IMPLANT
TRAY FOLEY W/BAG SLVR 16FR (SET/KITS/TRAYS/PACK) ×2
TRAY FOLEY W/BAG SLVR 16FR ST (SET/KITS/TRAYS/PACK) ×1 IMPLANT
YANKAUER SUCT BULB TIP NO VENT (SUCTIONS) ×2 IMPLANT

## 2021-05-11 NOTE — Transfer of Care (Signed)
Immediate Anesthesia Transfer of Care Note ? ?Patient: [name] ? ?Procedure(s) Performed: LEFT TOTAL HIP ARTHROPLASTY ANTERIOR APPROACH (Left: Hip) ? ?Patient Location: PACU ? ?Anesthesia Type:Spinal ? ?Level of Consciousness: awake, patient cooperative and responds to stimulation ? ?Airway & Oxygen Therapy: Patient Spontanous Breathing and Patient connected to face mask oxygen ? ?Post-op Assessment: Report given to RN and Post -op Vital signs reviewed and stable ? ?Post vital signs: Reviewed and stable ? ?Last Vitals:  ?Vitals Value Taken Time  ?BP    ?Temp    ?Pulse 71 05/11/21 1056  ?Resp 16 05/11/21 1056  ?SpO2 89 % 05/11/21 1056  ?Vitals shown include unvalidated device data. ? ?Last Pain:  ?Vitals:  ? 05/11/21 0828  ?TempSrc:   ?PainSc: 0-No pain  ?   ? ?  ? ?Complications: No notable events documented. ?

## 2021-05-11 NOTE — Evaluation (Signed)
Physical Therapy Evaluation ?Patient Details ?Name: [name] ?MRN: [id number] ?DOB: Aug 31, 1960 ?Today's Date: 05/11/2021 ? ?History of Present Illness ? Pt is a 61 y.o. female presenting 05/11/21 s/p L THA direct anterior approach. PMH includes A-fib, chronic LBP, CAD, HTN, and CVA (no residual deficits).  ?Clinical Impression ? Pt presents s/p L THA direct anterior approach. Pt impairments include decreased range of motion, strength, activity tolerance, pain modulate, and knowledge of use of DME. These impairments are limiting her ability to safely and independently transfer, ambulate, and navigate stairs. Pt required min guard for bed mobility, min guard to min A for transfers, and min guard A for ambulation. Pt provided with handout and educated on ankle pumps and quad sets (20/hour) to perform this afternoon/evening to increase circulation and modulate pain. PT to progress mobility as tolerated, and will continue to follow acutely to maximize pt's safety and independence with functional mobility.  ?  ?   ?   ? ?Recommendations for follow up therapy are one component of a multi-disciplinary discharge planning process, led by the attending physician.  Recommendations may be updated based on patient status, additional functional criteria and insurance authorization. ? ?Follow Up Recommendations Follow physician's recommendations for discharge plan and follow up therapies ? ?  ?Assistance Recommended at Discharge Intermittent Supervision/Assistance  ?Patient can return home with the following ? Assistance with cooking/housework;Assist for transportation;Help with stairs or ramp for entrance ? ?  ?Equipment Recommendations Rolling walker (2 wheels);BSC/3in1  ?Recommendations for Other Services ?    ?  ?Functional Status Assessment Patient has had a recent decline in their functional status and demonstrates the ability to make significant improvements in function in a reasonable and predictable amount of time.  ? ?   ?Precautions / Restrictions Precautions ?Precautions: Fall ?Restrictions ?Weight Bearing Restrictions: Yes ?LLE Weight Bearing: Weight bearing as tolerated  ? ?  ? ?Mobility ? Bed Mobility ?Overal bed mobility: Needs Assistance ?Bed Mobility: Supine to Sit ?  ?  ?Supine to sit: Min guard ?  ?  ?General bed mobility comments: pt min guard A for steadying when sitting up at EOB. She required increased time to sit up and bring LE to EOB. ?  ? ?Transfers ?Overall transfer level: Needs assistance ?Equipment used: Rolling walker (2 wheels) ?Transfers: Sit to/from Stand, Bed to chair/wheelchair/BSC ?Sit to Stand: Min assist ?  ?Step pivot transfers: Min guard ?  ?  ?  ?General transfer comment: pt min A for power up to stand. Pt required cues for hand placement prior to sit<>stand. Min guard A needed for steadying during step pivot and cues for RW management. ?  ? ?Ambulation/Gait ?Ambulation/Gait assistance: Min guard ?Gait Distance (Feet): 2 Feet ?Assistive device: Rolling walker (2 wheels) ?Gait Pattern/deviations: Decreased stride length, Decreased stance time - left ?  ?  ?  ?General Gait Details: side steps at EOB. Forward/backward steps taken in room with pt fatiguing quickly and LLE still slightly numb. Further mobility deferred this session due to pt fatigue and LLE numbness affecting attempts for ambulation. ? ?Stairs ?  ?  ?  ?  ?  ? ?Wheelchair Mobility ?  ? ?Modified Rankin (Stroke Patients Only) ?  ? ?  ? ?Balance Overall balance assessment: Needs assistance ?Sitting-balance support: No upper extremity supported, Feet supported ?Sitting balance-Leahy Scale: Good ?  ?  ?Standing balance support: Bilateral upper extremity supported, During functional activity, Reliant on assistive device for balance ?Standing balance-Leahy Scale: Poor ?Standing balance comment: reliant on RW  and external assist for support ?  ?  ?  ?  ?  ?  ?  ?  ?  ?  ?  ?   ? ? ? ?Pertinent Vitals/Pain Pain Assessment ?Pain Assessment:  0-10 ?Pain Score: 7  ?Pain Location: L hip and thigh ?Pain Descriptors / Indicators: Discomfort, Aching ?Pain Intervention(s): Monitored during session, Premedicated before session  ? ? ?Home Living Family/patient expects to be discharged to:: Private residence ?Living Arrangements: Alone ?Available Help at Discharge: Family;Friend(s);Available PRN/intermittently ?Type of Home: House ?Home Access: Stairs to enter ?Entrance Stairs-Rails: None ?Entrance Stairs-Number of Steps: 1 ?  ?Home Layout: One level ?Home Equipment: None ?Additional Comments: son and friends available PRN, though pt states they will be over frequently and often post D/C to make sure she has everything she needs. Good support system  ?  ?Prior Function Prior Level of Function : Independent/Modified Independent;Driving ?  ?  ?  ?  ?  ?  ?Mobility Comments: not using any AD to ambulate with. ?ADLs Comments: able to bathe, dress, and feed herself ?  ? ? ?Hand Dominance  ? Dominant Hand: Right ? ?  ?Extremity/Trunk Assessment  ? Upper Extremity Assessment ?Upper Extremity Assessment: Overall WFL for tasks assessed ?  ? ?Lower Extremity Assessment ?Lower Extremity Assessment: LLE deficits/detail ?LLE Deficits / Details: consistent with L THA ?  ? ?Cervical / Trunk Assessment ?Cervical / Trunk Assessment: Normal  ?Communication  ? Communication: No difficulties  ?Cognition Arousal/Alertness: Awake/alert ?Behavior During Therapy: [facility] for tasks assessed/performed ?Overall Cognitive Status: Within Functional Limits for tasks assessed ?  ?  ?  ?  ?  ?  ?  ?  ?  ?  ?  ?  ?  ?  ?  ?  ?  ?  ?  ? ?  ?General Comments   ? ?  ?Exercises General Exercises - Lower Extremity ?Ankle Circles/Pumps: Both, 20 reps ?Quad Sets: Both, 10 reps, Seated  ? ?Assessment/Plan  ?  ?PT Assessment Patient needs continued PT services  ?PT Problem List Decreased range of motion;Decreased strength;Decreased activity tolerance;Decreased balance;Decreased mobility;Decreased knowledge  of use of DME;Pain ? ?   ?  ?PT Treatment Interventions DME instruction;Gait training;Stair training;Functional mobility training;Therapeutic activities;Therapeutic exercise;Balance training;Patient/family education   ? ?PT Goals (Current goals can be found in the Care Plan section)  ?Acute Rehab PT Goals ?Patient Stated Goal: to return home ?PT Goal Formulation: With patient ?Time For Goal Achievement: 05/18/21 ?Potential to Achieve Goals: Good ? ?  ?Frequency 7X/week ?  ? ? ?Co-evaluation   ?  ?  ?  ?  ? ? ?  ?AM-PAC PT "6 Clicks" Mobility  ?Outcome Measure Help needed turning from your back to your side while in a flat bed without using bedrails?: None ?Help needed moving from lying on your back to sitting on the side of a flat bed without using bedrails?: A Little ?Help needed moving to and from a bed to a chair (including a wheelchair)?: A Little ?Help needed standing up from a chair using your arms (e.g., wheelchair or bedside chair)?: A Little ?Help needed to walk in hospital room?: A Little ?Help needed climbing 3-5 steps with a railing? : A Lot ?6 Click Score: 18 ? ?  ?End of Session Equipment Utilized During Treatment: Gait belt ?Activity Tolerance: Patient limited by fatigue ?Patient left: in chair;with call bell/phone within reach ?Nurse Communication: Mobility status ?PT Visit Diagnosis: Other abnormalities of gait and mobility (R26.89);Muscle weakness (generalized) (  M62.81);Unsteadiness on feet (R26.81);Pain ?Pain - Right/Left: Left ?Pain - part of body: Hip ?  ? ?Time: [id number] ?PT Time Calculation (min) (ACUTE ONLY): 23 min ? ? ?Charges:   PT Evaluation ?$PT Eval Low Complexity: 1 Low ?PT Treatments ?$Therapeutic Activity: 8-22 mins ?  ?   ? ? ?Jonne Ply, SPT ? ?Jonne Ply ?05/11/2021, 4:22 PM ? ?

## 2021-05-11 NOTE — Op Note (Signed)
? ?LEFT TOTAL HIP ARTHROPLASTY ANTERIOR APPROACH  Procedure Note ?[name]   [id number] ? ?Pre-op Diagnosis: left hip degenerative joint disease ?    ?Post-op Diagnosis: same ?  ?Operative Procedures  ?1. Total hip replacement; Left hip; uncemented [id number]  ? ?Surgeon: Gershon Mussel, M.D. ? ?Assist: Oneal Grout, PA-C ?  ?Anesthesia: spinal ? ?Prosthesis: Depuy ?Acetabulum: Pinnacle 52 mm ?Femur: Actis 3 STD ?Head: 36 mm size: +5 ?Liner: +4 ?Bearing Type: ceramic/poly ? ?Total Hip Arthroplasty (Anterior Approach) Op Note:  ?After informed consent was obtained and the operative extremity marked in the holding area, the patient was brought back to the operating room and placed supine on the HANA table. Next, the operative extremity was prepped and draped in normal sterile fashion. Surgical timeout occurred verifying patient identification, surgical site, surgical procedure and administration of antibiotics.  ?A modified anterior Smith-Peterson approach to the hip was performed, using the interval between tensor fascia lata and sartorius.  Dissection was carried bluntly down onto the anterior hip capsule. The lateral femoral circumflex vessels were identified and coagulated. A capsulotomy was performed and the capsular flaps tagged for later repair.  The neck osteotomy was performed. The femoral head was removed which showed severe wear, the acetabular rim was cleared of soft tissue and attention was turned to reaming the acetabulum.  ?Sequential reaming was performed under fluoroscopic guidance. We reamed to a size 51 mm, and then impacted the acetabular shell with excellent fit and fixation.  The liner was then placed after irrigation and attention turned to the femur.  ?After placing the femoral hook, the leg was taken to externally rotated, extended and adducted position taking care to perform soft tissue releases to allow for adequate mobilization of the femur. Soft tissue was cleared from the  shoulder of the greater trochanter and the hook elevator used to improve exposure of the proximal femur. Sequential broaching performed up to a size 3. Trial neck and head were placed. The leg was brought back up to neutral and the construct reduced.  Antibiotic irrigation was placed in the surgical wound and kept for at least 1 minute.  The position and sizing of components, offset and leg lengths were checked using fluoroscopy. Stability of the construct was checked in extension and external rotation without any subluxation or impingement of prosthesis. We dislocated the prosthesis, dropped the leg back into position, removed trial components, and irrigated copiously. The final stem and head was then placed, the leg brought back up, the system reduced and fluoroscopy used to verify positioning.  ?We irrigated, obtained hemostasis and closed the capsule using #2 ethibond suture.  One gram of vancomycin powder was placed in the surgical bed.   One gram of topical tranexamic acid was injected into the joint.  The fascia was closed with #1 vicryl plus, the deep fat layer was closed with 0 vicryl, the subcutaneous layers closed with 2.0 Vicryl Plus and the skin closed with 2.0 nylon and dermabond. A sterile dressing was applied. The patient was awakened in the operating room and taken to recovery in stable condition.  ?All sponge, needle, and instrument counts were correct at the end of the case.  ? ?Tessa Lerner, my PA, was a medical necessity for opening, closing, limb positioning, retracting, exposing, and overall facilitation and timely completion of the surgery. ? ?Position: supine  ?Complications: see description of procedure.  ?Time Out: performed  ? ?Drains/Packing: none ? ?Estimated blood loss: see anesthesia record ? ?Returned to Recovery  Room: in good condition.  ? ?Antibiotics: yes  ? ?Mechanical VTE (DVT) Prophylaxis: sequential compression devices, TED thigh-high  ?Chemical VTE (DVT) Prophylaxis:  resume eliquis and brillinta  ? ?Fluid Replacement: see anesthesia record ? ?Specimens Removed: 1 to pathology  ? ?Sponge and Instrument Count Correct? yes  ? ?PACU: portable radiograph - low AP  ? ?Plan/RTC: Return in 2 weeks for staple removal. ?Weight Bearing/Load Lower Extremity: full  ?Hip precautions: none ?Suture Removal: 2 weeks  ? ?N. Glee Arvin, MD ?[name] ?10:19 AM ? ? ?Implant Name Type Inv. Item Serial No. Manufacturer Lot No. LRB No. Used Action  ?PIN SECTOR W/GRIP ACE CUP - [id number] Hips PIN SECTOR W/GRIP ACE CUP  [employer] [id number] Left 1 Implanted  ?LINER NEUTRAL 52X36MM PLUS 4 - [id number] Liner LINER NEUTRAL 52X36MM PLUS 4  [employer] [id number] Left 1 Implanted  ?STEM FEM SZ3 STD ACTIS - [id number] Stem STEM FEM SZ3 STD ACTIS  [employer] [id number] Left 1 Implanted  ?HEAD CERAMIC 36 PLUS5 - [id number] Hips HEAD CERAMIC 36 PLUS5  [employer] [id number] Left 1 Implanted  ? ? ?

## 2021-05-11 NOTE — Discharge Instructions (Signed)

## 2021-05-11 NOTE — Anesthesia Procedure Notes (Signed)
Procedure Name: [place] ?Date/Time: 05/11/2021 8:55 AM ?Performed by: Michele Rockers, CRNA ?Pre-anesthesia Checklist: Patient identified, Emergency Drugs available, Suction available, Timeout performed and Patient being monitored ?Patient Re-evaluated:Patient Re-evaluated prior to induction ?Oxygen Delivery Method: Simple face mask ? ? ? ? ?

## 2021-05-11 NOTE — Anesthesia Procedure Notes (Signed)
Spinal ? ?Patient location during procedure: OR ?End time: 05/11/2021 9:11 AM ?Reason for block: surgical anesthesia ?Staffing ?Performed: anesthesiologist  ?Anesthesiologist: Jairo Ben, MD ?Preanesthetic Checklist ?Completed: patient identified, IV checked, site marked, risks and benefits discussed, surgical consent, monitors and equipment checked, pre-op evaluation and timeout performed ?Spinal Block ?Patient position: sitting ?Prep: DuraPrep and site prepped and draped ?Patient monitoring: blood pressure, continuous pulse ox, cardiac monitor and heart rate ?Approach: midline ?Location: L3-4 ?Injection technique: single-shot ?Needle ?Needle type: Pencan and Introducer  ?Needle gauge: 22 G ?Needle length: 9 cm ?Assessment ?Events: CSF return ?Additional Notes ?Pt identified in Operating room.  Monitors applied. Working IV access confirmed. Sterile prep, drape lumbar spine.  1% lido local L 3,4.  #24ga Pencan, several attempts os, then #22ga Quincke into clear CSF L 3,4.  12mg  0.75% Bupivacaine with dextrose injected with asp CSF beginning and end of injection.  Patient asymptomatic, VSS, no heme aspirated, tolerated well.  , MD ?  ? ? ? ?

## 2021-05-11 NOTE — Anesthesia Postprocedure Evaluation (Signed)
Anesthesia Post Note ? ?Patient: [name] ? ?Procedure(s) Performed: LEFT TOTAL HIP ARTHROPLASTY ANTERIOR APPROACH (Left: Hip) ? ?  ? ?Patient location during evaluation: PACU ?Anesthesia Type: Spinal ?Level of consciousness: awake and alert, patient cooperative and oriented ?Pain management: pain level controlled ?Vital Signs Assessment: post-procedure vital signs reviewed and stable ?Respiratory status: spontaneous breathing, nonlabored ventilation and respiratory function stable ?Cardiovascular status: blood pressure returned to baseline and stable ?Postop Assessment: no apparent nausea or vomiting, spinal receding and patient able to bend at knees ?Anesthetic complications: no ? ? ?No notable events documented. ? ?Last Vitals:  ?Vitals:  ? 05/11/21 1230 05/11/21 1240  ?BP: 135/70 134/76  ?Pulse: (!) 57 (!) 54  ?Resp: 12 15  ?Temp:    ?SpO2: 100% 100%  ?  ?Last Pain:  ?Vitals:  ? 05/11/21 1230  ?TempSrc:   ?PainSc: 4   ? ? ?  ?  ?  ?  ?  ?  ? ?Jalena Vanderlinden,E. Harris Kistler ? ? ? ? ?

## 2021-05-11 NOTE — H&P (Signed)
? ? ?PREOPERATIVE H&P ? ?Chief Complaint: left hip degenerative joint disease ? ?HPI: ?[name] is a 61 y.o. female who presents for surgical treatment of left hip degenerative joint disease.  She denies any changes in medical history. ? ?Past Medical History:  ?Diagnosis Date  ? Allergy   ? Arthritis   ? back   ? Atrial fibrillation ([place])   ? Atypical chest pain 06/30/2016  ? Back pain   ? DUE TO INJURY AT WORK ON05/2013  ? Bruises easily   ? Chronic lower back pain   ? Coronary artery disease   ? Headache(784.0)   ? rarely  ? History of bronchitis   ? last time about 51yrs ago   ? Hypertension   ? takes Benicar daily  ? OSA (obstructive sleep apnea) 04/28/2020  ? Preoperative evaluation of a medical condition to rule out surgical contraindications (TAR required) 09/12/2020  ? Pure hypercholesterolemia 05/31/2019  ? Stroke [facility])   ? no residual  ? Vitamin D deficiency   ? takes Vitamin D 2 times/wk  ? Weakness   ? and numbness right foot  ? ?Past Surgical History:  ?Procedure Laterality Date  ? BACK SURGERY    ? CARPAL TUNNEL RELEASE Right   ? [place]  ? COLONOSCOPY    ? EXPLORATORY LAPAROTOMY  1991  ? "took out fatty tumor" (11/09/2012)  ? IR ANGIO INTRA EXTRACRAN SEL COM CAROTID INNOMINATE BILAT MOD SED  08/10/2019  ? IR ANGIO INTRA EXTRACRAN SEL COM CAROTID INNOMINATE BILAT MOD SED  10/07/2020  ? IR ANGIO INTRA EXTRACRAN SEL COM CAROTID INNOMINATE UNI L MOD SED  11/05/2020  ? IR ANGIO VERTEBRAL SEL SUBCLAVIAN INNOMINATE BILAT MOD SED  10/07/2020  ? IR ANGIO VERTEBRAL SEL VERTEBRAL BILAT MOD SED  08/10/2019  ? IR CT HEAD LTD  11/05/2020  ? IR INTRAVSC STENT CERV CAROTID W/EMB-PROT MOD SED INCL ANGIO  11/05/2020  ? IR RADIOLOGIST EVAL & MGMT  11/21/2020  ? IR US GUIDE VASC ACCESS RIGHT  08/10/2019  ? IR US GUIDE VASC ACCESS RIGHT  10/07/2020  ? LEFT HEART CATH AND CORONARY ANGIOGRAPHY N/A 08/06/2016  ? Procedure: Left Heart Cath and Coronary Angiography;  Surgeon: Belva Crome, MD;  Location: [place] [facility];  Service: Cardiovascular;  Laterality: N/A;  ? LEFT HEART CATH AND CORONARY ANGIOGRAPHY N/A 07/12/2018  ? Procedure: LEFT HEART CATH AND CORONARY ANGIOGRAPHY;  Surgeon: Leonie Man, MD;  Location: [place] [facility];  Service: Cardiovascular;  Laterality: N/A;  ? LUMBAR LAMINECTOMY/DECOMPRESSION MICRODISCECTOMY  11/09/2012  ? "L3-4" (11/09/2012)  ? LUMBAR LAMINECTOMY/DECOMPRESSION MICRODISCECTOMY N/A 11/09/2012  ? Procedure: L3-L4 DECOMPRESSION AND MICRODISCECTOMY   (1 LEVEL);  Surgeon: Melina Schools, MD;  Location: [place];  Service: Orthopedics;  Laterality: N/A;  ? RADIOLOGY WITH ANESTHESIA N/A 11/05/2020  ? Procedure: STENTING;  Surgeon: Luanne Bras, MD;  Location: [place];  Service: Radiology;  Laterality: N/A;  ? ?Social History  ? ?Socioeconomic History  ? Marital status: Divorced  ?  Spouse name: Not on file  ? Number of children: 1  ? Years of education: Not on file  ? Highest education level: Some college, no degree  ?Occupational History  ? Occupation: Sports administrator  ?Tobacco Use  ? Smoking status: Never  ? Smokeless tobacco: Never  ?Vaping Use  ? Vaping Use: Never used  ?Substance and Sexual Activity  ? Alcohol use: Not Currently  ?  Alcohol/week: 2.0 standard drinks  ?  Types: 2 Glasses of wine per week  ?  Comment: social- 2 wine or 2 beers  ? Drug use: Never  ? Sexual activity: Yes  ?  Birth control/protection: Post-menopausal  ?Other Topics Concern  ? Not on file  ?Social History Narrative  ? Right-handed.  ? Occasional caffeine.  ? Lives alone.  ? ?Social Determinants of Health  ? ?Financial Resource Strain: Not on file  ?Food Insecurity: Not on file  ?Transportation Needs: Not on file  ?Physical Activity: Not on file  ?Stress: Not on file  ?Social Connections: Not on file  ? ?Family History  ?Problem Relation Age of Onset  ? Heart disease Mother   ? CAD Mother   ? Stroke Mother   ? Heart disease Sister   ? Heart attack Sister   ? Liver disease Brother   ? Other  Father   ?     unknown medical history  ? CAD Brother   ? Colon cancer Neg Hx   ? Esophageal cancer Neg Hx   ? Rectal cancer Neg Hx   ? Stomach cancer Neg Hx   ? ?Allergies  ?Allergen Reactions  ? Other Itching and Other (See Comments)  ?  Patient experienced intense itching in her arm that was accessed for a past angiogram  ? ?Prior to Admission medications   ?Medication Sig Start Date End Date Taking? Authorizing Provider  ?acetaminophen (TYLENOL) 500 MG tablet Take 500-1,000 mg by mouth every 6 (six) hours as needed (pain or headaches).    [provider]  ?aspirin 81 MG chewable tablet Chew 81 mg by mouth daily.    [provider]  ?atorvastatin (LIPITOR) 80 MG tablet Take 1 tablet (80 mg total) by mouth daily at 6 PM. 10/13/20   Dorothyann Peng, MD  ?benzonatate (TESSALON PERLES) 100 MG capsule Take 1 capsule (100 mg total) by mouth 3 (three) times daily as needed for cough. ?Patient not taking: Reported on 04/29/2021 11/12/20 11/12/21  Charlesetta Ivory, NP  ?cholecalciferol (VITAMIN D3) 25 MCG (1000 UNIT) tablet Take 1,000 Units by mouth daily.     [provider]  ?diltiazem (CARDIZEM CD) 120 MG 24 hr capsule TAKE 1 CAPSULE BY MOUTH EVERY DAY 03/18/21   Dorothyann Peng, MD  ?docusate sodium (COLACE) 100 MG capsule Take 1 capsule (100 mg total) by mouth daily as needed. 05/04/21 05/04/22  Cristie Hem, PA-C  ?ELIQUIS 5 MG TABS tablet TAKE 1 TABLET BY MOUTH TWICE A DAY 04/24/21   Ronney Asters, NP  ?ezetimibe (ZETIA) 10 MG tablet TAKE 1 TABLET BY MOUTH EVERY DAY 11/14/20   Chilton Si, MD  ?fluticasone (FLONASE) 50 MCG/ACT nasal spray PLACE 2 SPRAYS IN BOTH NOSTRILS DAILY AS NEEDED FOR ALLERGIES OR RHINITIS 10/13/20   Dorothyann Peng, MD  ?furosemide (LASIX) 40 MG tablet TAKE 1 TABLET DAILY FOR 3 DAYS AND THEN AS DIRECTED ?Patient not taking: Reported on 04/29/2021 01/01/21   Chilton Si, MD  ?HYDROcodone-acetaminophen [facility]) 5-325 MG tablet Take 1 tablet by mouth 2 (two) times  daily as needed. 01/06/21   Cristie Hem, PA-C  ?hydrOXYzine (ATARAX/VISTARIL) 25 MG tablet Take 1 tablet (25 mg total) by mouth 3 (three) times daily as needed. ?Patient not taking: Reported on 04/29/2021 08/21/19   Arnette Felts, FNP  ?levocetirizine (XYZAL) 5 MG tablet TAKE 1 TABLET BY MOUTH EVERY DAY 12/19/20   Dorothyann Peng, MD  ?Magnesium 250 MG TABS TAKE 1 TABLET BY MOUTH DAILY WITH EVENING MEALS 10/13/20  Glendale Chard, MD  ?methocarbamol (ROBAXIN) 500 MG tablet Take 1 tablet (500 mg total) by mouth 2 (two) times daily as needed. To be taken after surgery 05/04/21   Aundra Dubin, PA-C  ?mometasone (ELOCON) 0.1 % cream APPLY TO AFFECTED AREA EVERY DAY ?Patient not taking: Reported on 04/29/2021 10/29/20   Minette Brine, [place]  ?Olmesartan-amLODIPine-HCTZ 20-5-12.5 MG TABS TAKE 1 TABLET BY MOUTH EVERY DAY 03/11/21   [place]ale Chard, MD  ?ondansetron (ZOFRAN) 4 MG tablet Take 1 tablet (4 mg total) by mouth every 8 (eight) hours as needed for nausea or vomiting. 05/04/21   Aundra Dubin, PA-C  ?oxyCODONE-acetaminophen (PERCOCET) 5-325 MG tablet Take 1-2 tablets by mouth every 6 (six) hours as needed. To be taken after surgery 05/04/21   Aundra Dubin, PA-C  ?ticagrelor (BRILINTA) 90 MG TABS tablet Take 1 tablet (90 mg total) by mouth 2 (two) times daily. 03/13/21   Han, Aimee H, PA-C  ?topiramate (TOPAMAX) 100 MG tablet Take 1 tablet (100 mg total) by mouth at bedtime. 09/09/20   Marcial Pacas, MD  ? ? ? ?Positive ROS: All other systems have been reviewed and were otherwise negative with the exception of those mentioned in the HPI and as above. ? ?Physical Exam: ?General: Alert, no acute distress ?Cardiovascular: No pedal edema ?Respiratory: No cyanosis, no use of accessory musculature ?GI: abdomen soft ?Skin: No lesions in the area of chief complaint ?Neurologic: Sensation intact distally ?Psychiatric: Patient is competent for consent with normal mood and affect ?Lymphatic: no lymphedema ? ?MUSCULOSKELETAL: exam  stable ? ?Assessment: ?left hip degenerative joint disease ? ?Plan: ?Plan for Procedure(s): ?LEFT TOTAL HIP ARTHROPLASTY ANTERIOR APPROACH ? ?The risks benefits and alternatives were discussed with

## 2021-05-11 NOTE — TOC Progression Note (Addendum)
Transition of Care (TOC) - Progression Note  ? ? ?Patient Details  ?Name: SERITA DEGROOTE ?MRN: [id number] ?Date of Birth: May 20, 1960 ? ?Transition of Care (TOC) CM/SW Contact  ?Kingsley Plan, RN ?Phone Number: ?05/11/2021, 1:59 PM ? ?Clinical Narrative:    ? ?Clifton Custard with [facility] unable to accept.  ? ?NCM reaching out to other home health agencies  ? ? ?[facility] staff will provide needed DME  ? ? [facility]  ?  ? ?Expected Discharge Plan and Services ?  ?  ?  ?  ?  ?                ?  ?  ?  ?  ?  ?  ?  ?  ?  ?  ? ? ?Social Determinants of Health (SDOH) Interventions ?  ? ?Readmission Risk Interventions ?   ? View : No data to display.  ?  ?  ?  ? ? ?

## 2021-05-12 ENCOUNTER — Encounter (HOSPITAL_COMMUNITY): Payer: Self-pay | Admitting: Orthopaedic Surgery

## 2021-05-12 DIAGNOSIS — M1612 Unilateral primary osteoarthritis, left hip: Secondary | ICD-10-CM | POA: Diagnosis not present

## 2021-05-12 LAB — CBC
HCT: 29 % — ABNORMAL LOW (ref 36.0–46.0)
Hemoglobin: 9.4 g/dL — ABNORMAL LOW (ref 12.0–15.0)
MCH: 28.8 pg (ref 26.0–34.0)
MCHC: 32.4 g/dL (ref 30.0–36.0)
MCV: 89 fL (ref 80.0–100.0)
Platelets: 252 10*3/uL (ref 150–400)
RBC: 3.26 MIL/uL — ABNORMAL LOW (ref 3.87–5.11)
RDW: 15.4 % (ref 11.5–15.5)
WBC: 16.4 10*3/uL — ABNORMAL HIGH (ref 4.0–10.5)
nRBC: 0 % (ref 0.0–0.2)

## 2021-05-12 NOTE — Progress Notes (Signed)
Physical Therapy Treatment ?Patient Details ?Name: [name] ?MRN: [id number] ?DOB: October 02, 1960 ?Today's Date: 05/12/2021 ? ? ?History of Present Illness Pt is a 61 y.o. female presenting 05/11/21 s/p L THA direct anterior approach. PMH includes A-fib, chronic LBP, CAD, HTN, and CVA (no residual deficits). ? ?  ?PT Comments  ? ? Pt with significant improvement today. Pt able to ambulate with RW and complete stair negotiation to safely enter home. Pt primary concern is getting in/out of tub. Discussed getting a shower seat and demonstrated how to back up to tub and sit on shower chair and then swing LEs over edge of tub. PT also recommending having friend who is staying with her to help her the first time in addition to trying it with HHPT. Pt agreeable. Pt also concerned about getting out of bed with HOB flat. PT to return again today to focus on bed mobility prior to d/c. ?   ?Recommendations for follow up therapy are one component of a multi-disciplinary discharge planning process, led by the attending physician.  Recommendations may be updated based on patient status, additional functional criteria and insurance authorization. ? ?Follow Up Recommendations ? Follow physician's recommendations for discharge plan and follow up therapies ?  ?  ?Assistance Recommended at Discharge Intermittent Supervision/Assistance  ?Patient can return home with the following Assistance with cooking/housework;Assist for transportation;Help with stairs or ramp for entrance ?  ?Equipment Recommendations ? Rolling walker (2 wheels) (youth walker, shower seat)  ?  ?Recommendations for Other Services   ? ? ?  ?Precautions / Restrictions Precautions ?Precautions: Fall ?Restrictions ?Weight Bearing Restrictions: Yes ?LLE Weight Bearing: Weight bearing as tolerated  ?  ? ?Mobility ? Bed Mobility ?Overal bed mobility: Needs Assistance ?Bed Mobility: Supine to Sit ?  ?  ?Supine to sit: Min guard ?  ?  ?General bed mobility comments: trialed  with HOB flat, instructed on long sit technique and L quad set prior to adduction of L LE off EOB. Pt requiring constant verbal cues and incresaed time to complete however no physical assist needed ?  ? ?Transfers ?Overall transfer level: Needs assistance ?Equipment used: Rolling walker (2 wheels) ?Transfers: Sit to/from Stand, Bed to chair/wheelchair/BSC ?Sit to Stand: Min guard ?  ?  ?  ?  ?  ?General transfer comment: verbal cues to push up from bed and reach back for chair not to pull up on RW or hold onto RW when returning to sitting, pt with good return demonstration and carry over ?  ? ?Ambulation/Gait ?Ambulation/Gait assistance: Min guard ?Gait Distance (Feet): 150 Feet ?Assistive device: Rolling walker (2 wheels) ?Gait Pattern/deviations: Decreased stride length, Decreased stance time - left ?Gait velocity: dec ?Gait velocity interpretation: 1.31 - 2.62 ft/sec, indicative of limited community ambulator ?  ?General Gait Details: increased bilat UE dependency, v/cs to relax shoulders, shorter step length, with onst of fatigue pt with noted L LE instability ? ? ?Stairs ?Stairs: Yes ?Stairs assistance: Min guard ?Stair Management: One rail Left, Step to pattern ?Number of Stairs: 1 ?General stair comments: pt with 1, 6 in step to enter home. educated pt on bringing RW up onto to step and leading up with the R LE (good leg) and going down with the L LE (bad leg). pt with good return demonstration ? ? ?Wheelchair Mobility ?  ? ?Modified Rankin (Stroke Patients Only) ?  ? ? ?  ?Balance Overall balance assessment: Needs assistance ?Sitting-balance support: No upper extremity supported, Feet supported ?Sitting balance-Leahy Scale: Good ?  ?  ?  Standing balance support: Bilateral upper extremity supported, During functional activity, Reliant on assistive device for balance ?Standing balance-Leahy Scale: Fair ?Standing balance comment: pt fair with static standing balance, requires RW for safe amb ?  ?  ?  ?  ?  ?  ?   ?  ?  ?  ?  ?  ? ?  ?Cognition Arousal/Alertness: Awake/alert ?Behavior During Therapy: [employer] for tasks assessed/performed ?Overall Cognitive Status: Within Functional Limits for tasks assessed ?  ?  ?  ?  ?  ?  ?  ?  ?  ?  ?  ?  ?  ?  ?  ?  ?  ?  ?  ? ?  ?Exercises General Exercises - Lower Extremity ?Quad Sets: Both, 10 reps, Supine ?Gluteal Sets: AROM, Both, 10 reps, Seated (with 5 sec hold) ?Long Arc Quad: AROM, Left, 10 reps, Seated (with 5 sec hold at top) ? ?  ?General Comments General comments (skin integrity, edema, etc.): VSS, pt continues to report numbness t/o L LE however much improved from yesterday ?  ?  ? ?Pertinent Vitals/Pain Pain Assessment ?Pain Assessment: 0-10 ?Pain Score: 6  ?Pain Location: L hip and thigh ?Pain Descriptors / Indicators: Discomfort, Aching  ? ? ?Home Living   ?  ?  ?  ?  ?  ?  ?  ?  ?  ?   ?  ?Prior Function    ?  ?  ?   ? ?PT Goals (current goals can now be found in the care plan section) Acute Rehab PT Goals ?Patient Stated Goal: to return home ?PT Goal Formulation: With patient ?Time For Goal Achievement: 05/18/21 ?Potential to Achieve Goals: Good ?Progress towards PT goals: Progressing toward goals ? ?  ?Frequency ? ? ? 7X/week ? ? ? ?  ?PT Plan Current plan remains appropriate  ? ? ?Co-evaluation   ?  ?  ?  ?  ? ?  ?AM-PAC PT "6 Clicks" Mobility   ?Outcome Measure ? Help needed turning from your back to your side while in a flat bed without using bedrails?: None ?Help needed moving from lying on your back to sitting on the side of a flat bed without using bedrails?: A Little ?Help needed moving to and from a bed to a chair (including a wheelchair)?: A Little ?Help needed standing up from a chair using your arms (e.g., wheelchair or bedside chair)?: A Little ?Help needed to walk in hospital room?: A Little ?Help needed climbing 3-5 steps with a railing? : A Little ?6 Click Score: 19 ? ?  ?End of Session Equipment Utilized During Treatment: Gait belt ?Activity Tolerance:  Patient limited by fatigue ?Patient left: in chair;with call bell/phone within reach ?Nurse Communication: Mobility status ?PT Visit Diagnosis: Other abnormalities of gait and mobility (R26.89);Muscle weakness (generalized) (M62.81);Unsteadiness on feet (R26.81);Pain ?Pain - Right/Left: Left ?Pain - part of body: Hip ?  ? ? ?Time: [id number] ?PT Time Calculation (min) (ACUTE ONLY): 27 min ? ?Charges:  $Gait Training: 8-22 mins ?$Therapeutic Exercise: 8-22 mins          ?          ? ?Kittie Plater, PT, DPT ?Acute Rehabilitation Services ?Secure chat preferred ?Office #: [phone] ? ? ?Klaryssa Fauth M Jefry Lesinski ?05/12/2021, 9:21 AM ? ?

## 2021-05-12 NOTE — Progress Notes (Signed)
Subjective: ?1 Day Post-Op Procedure(s) (LRB): ?LEFT TOTAL HIP ARTHROPLASTY ANTERIOR APPROACH (Left) ?Patient reports pain as mild.   ? ?Objective: ?Vital signs in last 24 hours: ?Temp:  [97.8 ?F (36.6 ?C)-98.8 ?F (37.1 ?C)] 97.8 ?F (36.6 ?C) (05/02 3354) ?Pulse Rate:  [54-97] 78 (05/02 0728) ?Resp:  [12-19] 18 (05/02 0728) ?BP: (106-155)/(60-89) 135/82 (05/02 0728) ?SpO2:  [92 %-100 %] 99 % (05/02 0728) ? ?Intake/Output from previous day: ?05/01 0701 - 05/02 0700 ?In: 2961.2 [P.O.:780; I.V.:1181.3; IV Piggyback:1000] ?Out: 2900 [Urine:2750; Blood:150] ?Intake/Output this shift: ?No intake/output data recorded. ? ?Recent Labs  ?  05/12/21 ?0610  ?HGB 9.4*  ? ?Recent Labs  ?  05/12/21 ?0610  ?WBC 16.4*  ?RBC 3.26*  ?HCT 29.0*  ?PLT 252  ? ?No results for input(s): NA, K, CL, CO2, BUN, CREATININE, GLUCOSE, CALCIUM in the last 72 hours. ?No results for input(s): LABPT, INR in the last 72 hours. ? ?Neurologically intact ?Neurovascular intact ?Sensation intact distally ?Intact pulses distally ?Dorsiflexion/Plantar flexion intact ?Incision: dressing C/D/I ?No cellulitis present ?Compartment soft ? ? ?Assessment/Plan: ?1 Day Post-Op Procedure(s) (LRB): ?LEFT TOTAL HIP ARTHROPLASTY ANTERIOR APPROACH (Left) ?Advance diet ?Up with therapy ?D/C IV fluids ?D/c home after first or second PT session (depending on how well she progresses) ?WBAT LLE ?ABLA- mild and stable ? ? ? ? ?Cristie Hem ?05/12/2021, 8:01 AM ? ?

## 2021-05-12 NOTE — Plan of Care (Signed)
Pt doing well. Pt given D/C instructions with verbal understanding. Rx's were sent to the pharmacy by MD. Pt's incision is clean and dry with no sign of infection. Pt's IV was removed prior to D/C. Pt received RW from [employer] per MD order. Pt D/C'd home via wheelchair per MD order. Pt D/C'd home via wheelchair per MD order. Pt is stable @ D/C and has  no other needs at this time. Rema Fendt, RN  ?

## 2021-05-12 NOTE — Discharge Summary (Signed)
Patient ID: ?Marisue Brooklyn ?MRN: [id number] ?DOB/AGE: 10-02-1960 61 y.o. ? ?Admit date: 05/11/2021 ?Discharge date: 05/12/2021 ? ?Admission Diagnoses:  ?Principal Problem: ?  Primary osteoarthritis of left hip ?Active Problems: ?  Status post total replacement of left hip ? ? ?Discharge Diagnoses:  ?Same ? ?Past Medical History:  ?Diagnosis Date  ? Allergy   ? Arthritis   ? back   ? Atrial fibrillation ([place])   ? Atypical chest pain 06/30/2016  ? Back pain   ? DUE TO INJURY AT WORK ON05/2013  ? Bruises easily   ? Chronic lower back pain   ? Coronary artery disease   ? Headache(784.0)   ? rarely  ? History of bronchitis   ? last time about 33yrs ago   ? Hypertension   ? takes Benicar daily  ? OSA (obstructive sleep apnea) 04/28/2020  ? Preoperative evaluation of a medical condition to rule out surgical contraindications (TAR required) 09/12/2020  ? Pure hypercholesterolemia 05/31/2019  ? Stroke [facility])   ? no residual  ? Vitamin D deficiency   ? takes Vitamin D 2 times/wk  ? Weakness   ? and numbness right foot  ? ? ?Surgeries: Procedure(s): ?LEFT TOTAL HIP ARTHROPLASTY ANTERIOR APPROACH on 05/11/2021 ?  ?Consultants:  ? ?Discharged Condition: Improved ? ?Hospital Course: CHEYRL GODWIN is an 61 y.o. female who was admitted 05/11/2021 for operative treatment ofPrimary osteoarthritis of left hip. Patient has severe unremitting pain that affects sleep, daily activities, and work/hobbies. After pre-op clearance the patient was taken to the operating room on 05/11/2021 and underwent  Procedure(s): ?LEFT TOTAL HIP ARTHROPLASTY ANTERIOR APPROACH.   ? ?Patient was given perioperative antibiotics:  ?Anti-infectives (From admission, onward)  ? ? Start     Dose/Rate Route Frequency Ordered Stop  ? 05/11/21 1430  ceFAZolin (ANCEF) IVPB 2g/100 mL premix       ? 2 g ?200 mL/hr over 30 Minutes Intravenous Every 6 hours 05/11/21 1339 05/11/21 2047  ? 05/11/21 0938  vancomycin (VANCOCIN) powder  Status:  Discontinued       ?   As needed  05/11/21 0938 05/11/21 1049  ? 05/11/21 0749  ceFAZolin (ANCEF) 2-4 GM/100ML-% IVPB  Status:  Discontinued       ?Note to Pharmacy: Gleason, Ginger E: cabinet override  ?    05/11/21 0749 05/11/21 0844  ? 05/11/21 0730  ceFAZolin (ANCEF) IVPB 2g/100 mL premix       ? 2 g ?200 mL/hr over 30 Minutes Intravenous On call to O.R. 05/11/21 [id number] 05/11/21 0855  ? ?  ?  ? ?Patient was given sequential compression devices, early ambulation, and chemoprophylaxis to prevent DVT. ? ?Patient benefited maximally from hospital stay and there were no complications.   ? ?Recent vital signs: Patient Vitals for the past 24 hrs: ? BP Temp Temp src Pulse Resp SpO2  ?05/12/21 0728 135/82 97.8 ?F (36.6 ?C) Oral 78 18 99 %  ?05/12/21 0406 (!) 148/89 97.8 ?F (36.6 ?C) Oral 97 -- 99 %  ?05/11/21 2331 109/69 98.4 ?F (36.9 ?C) Oral 64 18 100 %  ?05/11/21 1951 137/75 98.8 ?F (37.1 ?C) Oral 74 18 96 %  ?05/11/21 1712 (!) 155/80 98.1 ?F (36.7 ?C) Oral 78 18 100 %  ?05/11/21 1343 106/84 97.9 ?F (36.6 ?C) Oral (!) 57 18 99 %  ?05/11/21 1326 131/86 -- -- (!) 57 13 93 %  ?05/11/21 1240 134/76 -- -- (!) 54 15 100 %  ?05/11/21 1230 135/70 -- -- Marland Kitchen  57 12 100 %  ?05/11/21 1211 137/86 -- -- (!) 56 17 100 %  ?05/11/21 1156 125/80 -- -- 64 13 100 %  ?05/11/21 1141 134/73 -- -- (!) 58 19 100 %  ?05/11/21 1126 117/70 -- -- 65 14 100 %  ?05/11/21 1111 116/68 -- -- 66 19 98 %  ?05/11/21 1056 108/60 98 ?F (36.7 ?C) -- 71 16 92 %  ?  ? ?Recent laboratory studies:  ?Recent Labs  ?  05/12/21 ?0610  ?WBC 16.4*  ?HGB 9.4*  ?HCT 29.0*  ?PLT 252  ? ? ? ?Discharge Medications:   ?Allergies as of 05/12/2021   ? ?   Reactions  ? Other Itching, Other (See Comments)  ? Patient experienced intense itching in her arm that was accessed for a past angiogram  ? ?  ? ?  ?Medication List  ?  ? ?STOP taking these medications   ? ?acetaminophen 500 MG tablet ?Commonly known as: TYLENOL ?  ?benzonatate 100 MG capsule ?Commonly known as: Best boy ?  ?furosemide 40 MG  tablet ?Commonly known as: LASIX ?  ?HYDROcodone-acetaminophen 5-325 MG tablet ?Commonly known as: Norco ?  ?hydrOXYzine 25 MG tablet ?Commonly known as: ATARAX ?  ?mometasone 0.1 % cream ?Commonly known as: ELOCON ?  ? ?  ? ?TAKE these medications   ? ?aspirin 81 MG chewable tablet ?Chew 81 mg by mouth daily. ?  ?atorvastatin 80 MG tablet ?Commonly known as: LIPITOR ?Take 1 tablet (80 mg total) by mouth daily at 6 PM. ?  ?cholecalciferol 25 MCG (1000 UNIT) tablet ?Commonly known as: VITAMIN D3 ?Take 1,000 Units by mouth daily. ?  ?diltiazem 120 MG 24 hr capsule ?Commonly known as: CARDIZEM CD ?TAKE 1 CAPSULE BY MOUTH EVERY DAY ?  ?docusate sodium 100 MG capsule ?Commonly known as: Colace ?Take 1 capsule (100 mg total) by mouth daily as needed. ?  ?Eliquis 5 MG Tabs tablet ?Generic drug: apixaban ?TAKE 1 TABLET BY MOUTH TWICE A DAY ?  ?ezetimibe 10 MG tablet ?Commonly known as: ZETIA ?TAKE 1 TABLET BY MOUTH EVERY DAY ?  ?fluticasone 50 MCG/ACT nasal spray ?Commonly known as: FLONASE ?PLACE 2 SPRAYS IN BOTH NOSTRILS DAILY AS NEEDED FOR ALLERGIES OR RHINITIS ?  ?levocetirizine 5 MG tablet ?Commonly known as: XYZAL ?TAKE 1 TABLET BY MOUTH EVERY DAY ?  ?Magnesium 250 MG Tabs ?TAKE 1 TABLET BY MOUTH DAILY WITH EVENING MEALS ?  ?methocarbamol 500 MG tablet ?Commonly known as: Robaxin ?Take 1 tablet (500 mg total) by mouth 2 (two) times daily as needed. To be taken after surgery ?  ?Olmesartan-amLODIPine-HCTZ 20-5-12.5 MG Tabs ?TAKE 1 TABLET BY MOUTH EVERY DAY ?  ?ondansetron 4 MG tablet ?Commonly known as: Zofran ?Take 1 tablet (4 mg total) by mouth every 8 (eight) hours as needed for nausea or vomiting. ?  ?oxyCODONE-acetaminophen 5-325 MG tablet ?Commonly known as: Percocet ?Take 1-2 tablets by mouth every 6 (six) hours as needed. To be taken after surgery ?  ?ticagrelor 90 MG Tabs tablet ?Commonly known as: Brilinta ?Take 1 tablet (90 mg total) by mouth 2 (two) times daily. ?  ?topiramate 100 MG tablet ?Commonly known  as: TOPAMAX ?Take 1 tablet (100 mg total) by mouth at bedtime. ?  ? ?  ? ?  ?  ? ? ?  ?Durable Medical Equipment  ?(From admission, onward)  ?  ? ? ?  ? ?  Start     Ordered  ? 05/11/21 1340  DME Walker rolling  Once       ?Question:  Patient needs a walker to treat with the following condition  Answer:  History of hip replacement  ? 05/11/21 1339  ? 05/11/21 1340  DME 3 n 1  Once       ? 05/11/21 1339  ? 05/11/21 1340  DME Bedside commode  Once       ?Question:  Patient needs a bedside commode to treat with the following condition  Answer:  History of hip replacement  ? 05/11/21 1339  ? ?  ?  ? ?  ? ? ?Diagnostic Studies: DG Pelvis Portable ? ?Result Date: 05/11/2021 ?CLINICAL DATA:  Postop, hip arthroplasty EXAM: PORTABLE PELVIS 1-2 VIEWS COMPARISON:  Jun 11, 2014 FINDINGS: Total left hip replacement is identified without malalignment. Mild degenerative joint changes of the right hip with narrow hip joint space noted. IMPRESSION: Total left hip replacement without malalignment. Electronically Signed   By: Abelardo Diesel M.D.   On: 05/11/2021 11:24  ? ?DG C-Arm 1-60 Min-No Report ? ?Result Date: 05/11/2021 ?Fluoroscopy was utilized by the requesting physician.  No radiographic interpretation.  ? ?DG HIP UNILAT WITH PELVIS 1V LEFT ? ?Result Date: 05/11/2021 ?CLINICAL DATA:  Fluoroscopic assistance for left hip arthroplasty EXAM: DG HIP (WITH OR WITHOUT PELVIS) 1V*L* COMPARISON:  04/20/2021 FINDINGS: Fluoroscopic images show interval left hip arthroplasty. Fluoroscopic time was 44 seconds. Radiation dose was 7.78 mGy. IMPRESSION: Fluoroscopic assistance was provided for left hip arthroplasty. Electronically Signed   By: Elmer Picker M.D.   On: 05/11/2021 10:29  ? ?XR HIP UNILAT W OR W/O PELVIS 2-3 VIEWS LEFT ? ?Result Date: 04/27/2021 ?Advanced left hip joint DJD.  Bone on bone joint space narrowing.  Multiple subchondral cysts.  ? ?Disposition: Discharge disposition: 01-Home or Self Care ? ? ? ? ? ? ? ? ? Follow-up  Information   ? ? Leandrew Koyanagi, MD. Schedule an appointment as soon as possible for a visit in 2 week(s).   ?Specialty: Orthopedic Surgery ?Contact information: ?[address] ?[place] [address] ?[phone]

## 2021-05-12 NOTE — Progress Notes (Signed)
Physical Therapy Treatment ?Patient Details ?Name: [name] ?MRN: [id number] ?DOB: 08-14-1960 ?Today's Date: 05/12/2021 ? ? ?History of Present Illness Pt is a 61 y.o. female presenting 05/11/21 s/p L THA direct anterior approach. PMH includes A-fib, chronic LBP, CAD, HTN, and CVA (no residual deficits). ? ?  ?PT Comments  ? ? This session focused on bed mobility. Pt with improved ability since earlier session. Pt with noted weak L quad contraction limiting pt ability to abd/adductor and complete SLR with L LE. Family present for session and with good understanding of long sit technique. Family also educated on the proper shower seat that patient needs. Acute PT to cont to follow. ?   ?Recommendations for follow up therapy are one component of a multi-disciplinary discharge planning process, led by the attending physician.  Recommendations may be updated based on patient status, additional functional criteria and insurance authorization. ? ?Follow Up Recommendations ? Follow physician's recommendations for discharge plan and follow up therapies ?  ?  ?Assistance Recommended at Discharge Intermittent Supervision/Assistance  ?Patient can return home with the following Assistance with cooking/housework;Assist for transportation;Help with stairs or ramp for entrance ?  ?Equipment Recommendations ? Rolling walker (2 wheels) (youth walker, shower seat)  ?  ?Recommendations for Other Services   ? ? ?  ?Precautions / Restrictions Precautions ?Precautions: Fall ?Restrictions ?Weight Bearing Restrictions: Yes ?LLE Weight Bearing: Weight bearing as tolerated  ?  ? ?Mobility ? Bed Mobility ?Overal bed mobility: Needs Assistance ?Bed Mobility: Supine to Sit ?  ?  ?Supine to sit: Min guard ?  ?  ?General bed mobility comments: worked on getting in/out of bed without use of bed rails and bed at height that matches her home. increased time but no physical assist needed. Pt with noted decreased L quad activiation limiting ability  to adduct L LE to edge of bed, pt using foot to wiggle LE over, pt is able to bring L LE back up into the bed without assist ?  ? ?Transfers ?Overall transfer level: Needs assistance ?Equipment used: Rolling walker (2 wheels) ?Transfers: Sit to/from Stand, Bed to chair/wheelchair/BSC ?Sit to Stand: Min guard ?  ?  ?  ?  ?  ?General transfer comment: verbal cues to push up from bed and reach back for chair not to pull up on RW or hold onto RW when returning to sitting, pt with good return demonstration and carry over ?  ? ?Ambulation/Gait ?Ambulation/Gait assistance: Supervision ?Gait Distance (Feet): 10 Feet ?Assistive device: Rolling walker (2 wheels) ?Gait Pattern/deviations: Decreased stride length, Decreased stance time - left ?Gait velocity: dec ?Gait velocity interpretation: <1.31 ft/sec, indicative of household ambulator ?  ?General Gait Details: pt received walking out of the bathroom, pt with good walker management in the tight space of the bathroom ? ? ?Stairs ?Stairs: Yes ?Stairs assistance: Min guard ?Stair Management: One rail Left, Step to pattern ?Number of Stairs: 1 ?General stair comments: pt with 1, 6 in step to enter home. educated pt on bringing RW up onto to step and leading up with the R LE (good leg) and going down with the L LE (bad leg). pt with good return demonstration ? ? ?Wheelchair Mobility ?  ? ?Modified Rankin (Stroke Patients Only) ?  ? ? ?  ?Balance Overall balance assessment: Needs assistance ?Sitting-balance support: No upper extremity supported, Feet supported ?Sitting balance-Leahy Scale: Good ?  ?  ?Standing balance support: Bilateral upper extremity supported, During functional activity, Reliant on assistive device for balance ?  Standing balance-Leahy Scale: Fair ?Standing balance comment: pt fair with static standing balance, requires RW for safe amb ?  ?  ?  ?  ?  ?  ?  ?  ?  ?  ?  ?  ? ?  ?Cognition Arousal/Alertness: Awake/alert ?Behavior During Therapy: [facility] for tasks  assessed/performed ?Overall Cognitive Status: Within Functional Limits for tasks assessed ?  ?  ?  ?  ?  ?  ?  ?  ?  ?  ?  ?  ?  ?  ?  ?  ?  ?  ?  ? ?  ?Exercises General Exercises - Lower Extremity ?Quad Sets: Both, 10 reps, Supine ?Gluteal Sets: AROM, Both, 10 reps, Seated (with 5 sec hold) ?Long Arc Quad: AROM, Left, 10 reps, Seated (with 5 sec hold at top) ?Hip ABduction/ADduction: AAROM, Left, 10 reps, Supine ?Straight Leg Raises: AAROM, Left, 5 reps, Supine ? ?  ?General Comments General comments (skin integrity, edema, etc.): VSS ?  ?  ? ?Pertinent Vitals/Pain Pain Assessment ?Pain Assessment: 0-10 ?Pain Score: 4  ?Pain Location: L hip and thigh ?Pain Descriptors / Indicators: Sore  ? ? ?Home Living   ?  ?  ?  ?  ?  ?  ?  ?  ?  ?   ?  ?Prior Function    ?  ?  ?   ? ?PT Goals (current goals can now be found in the care plan section) Acute Rehab PT Goals ?Patient Stated Goal: to return home ?PT Goal Formulation: With patient ?Time For Goal Achievement: 05/18/21 ?Potential to Achieve Goals: Good ?Progress towards PT goals: Progressing toward goals ? ?  ?Frequency ? ? ? 7X/week ? ? ? ?  ?PT Plan Current plan remains appropriate  ? ? ?Co-evaluation   ?  ?  ?  ?  ? ?  ?AM-PAC PT "6 Clicks" Mobility   ?Outcome Measure ? Help needed turning from your back to your side while in a flat bed without using bedrails?: None ?Help needed moving from lying on your back to sitting on the side of a flat bed without using bedrails?: A Little ?Help needed moving to and from a bed to a chair (including a wheelchair)?: A Little ?Help needed standing up from a chair using your arms (e.g., wheelchair or bedside chair)?: A Little ?Help needed to walk in hospital room?: A Little ?Help needed climbing 3-5 steps with a railing? : A Little ?6 Click Score: 19 ? ?  ?End of Session Equipment Utilized During Treatment: Gait belt ?Activity Tolerance: Patient limited by fatigue ?Patient left: in chair;with call bell/phone within reach;with  family/visitor present ?Nurse Communication: Mobility status ?PT Visit Diagnosis: Other abnormalities of gait and mobility (R26.89);Muscle weakness (generalized) (M62.81);Unsteadiness on feet (R26.81);Pain ?Pain - Right/Left: Left ?Pain - part of body: Hip ?  ? ? ?Time: 3546-5681 ?PT Time Calculation (min) (ACUTE ONLY): 14 min ? ?Charges:  $Gait Training: 8-22 mins ?$Therapeutic Exercise: 8-22 mins ?$Therapeutic Activity: 8-22 mins          ?          ? ?Lewis Shock, PT, DPT ?Acute Rehabilitation Services ?Secure chat preferred ?Office #: [phone] ? ? ? ?Koby Hartfield M Wilfred Siverson ?05/12/2021, 11:37 AM ? ?

## 2021-05-26 ENCOUNTER — Ambulatory Visit (INDEPENDENT_AMBULATORY_CARE_PROVIDER_SITE_OTHER): Payer: 59 | Admitting: Orthopaedic Surgery

## 2021-05-26 ENCOUNTER — Encounter: Payer: Self-pay | Admitting: Orthopaedic Surgery

## 2021-05-26 DIAGNOSIS — Z96642 Presence of left artificial hip joint: Secondary | ICD-10-CM

## 2021-05-26 MED ORDER — TRAMADOL HCL 50 MG PO TABS: 50.0000 mg | ORAL_TABLET | Freq: Every day | ORAL | 0 refills | Status: DC | PRN

## 2021-05-26 NOTE — Progress Notes (Signed)
? ?Post-Op Visit Note ?  ?Patient: [name]           ?Date of Birth: 22-Mar-1960           ?MRN: [id number] ?Visit Date: 05/26/2021 ?PCP: Glendale Chard, MD ? ? ?Assessment & Plan: ? ?Chief Complaint:  ?Chief Complaint  ?Patient presents with  ? Left Hip - Routine Post Op  ? ?Visit Diagnoses:  ?1. Status post total replacement of left hip   ? ? ?Plan: Tiff[place] is 2 weeks status post left total hip replacement.  Currently doing home health PT once a week.  To home exercises due to increased pain.  Taking Tylenol for the pain but request something stronger. ? ?Examination of left hip shows a healed surgical incision.  Mild swelling around the thigh.  No neurovascular compromise.  No evidence of infection or drainage. ? ?Sutures removed Steri-Strips applied.  I would like her to back off on the physical therapy exercises and just work on walking for the next month.  Dental prophylaxis reinforced.  Implant card provided.  Recheck in 4 weeks with standing AP pelvis x-rays. ? ?Follow-Up Instructions: Return in about 4 weeks (around 06/23/2021).  ? ?Orders:  ?No orders of the defined types were placed in this encounter. ? ?Meds ordered this encounter  ?Medications  ? traMADol (ULTRAM) 50 MG tablet  ?  Sig: Take 1-2 tablets (50-100 mg total) by mouth daily as needed.  ?  Dispense:  30 tablet  ?  Refill:  0  ? ? ?Imaging: ?No results found. ? ?PMFS History: ?Patient Active Problem List  ? Diagnosis Date Noted  ? Status post total replacement of left hip 05/11/2021  ? Abnormal glucose 02/21/2021  ? Class 2 severe obesity due to excess calories with serious comorbidity and body mass index (BMI) of 37.0 to 37.9 in adult [facility]) 02/21/2021  ? Stenosis of artery ([place]) 02/18/2021  ? Internal carotid artery stenosis, left 09/23/2020  ? Preoperative evaluation of a medical condition to rule out surgical contraindications (TAR required) 09/12/2020  ? Paroxysmal atrial fibrillation ([place]) 04/28/2020  ? OSA (obstructive sleep apnea)  04/28/2020  ? Atrial fibrillation with rapid ventricular response ([place]) 03/17/2020  ? Hypokalemia   ? Pulmonary hypertension, primary ([place])   ? Atrial fibrillation with RVR ([place]) 03/16/2020  ? Elevated troponin   ? Primary osteoarthritis of right knee 11/20/2019  ? Cerebrovascular accident (CVA) ([place]) 10/18/2019  ? ICAO (internal carotid artery occlusion), right 10/18/2019  ? Primary osteoarthritis of left hip 09/11/2019  ? SAH (subarachnoid hemorrhage) ([place]) 08/07/2019  ? Acute embolic stroke ([place])   ? Left hip pain 06/25/2019  ? Severe left groin pain 06/21/2019  ? Bilateral carotid artery stenosis 06/21/2019  ? Pure hypercholesterolemia 05/31/2019  ? CAD in native artery 04/09/2019  ? Complicated migraine [id number]  ? Hyperglycemia 07/10/2018  ? Abnormal stress test 08/06/2016  ? Essential hypertension 06/30/2016  ? Hypothyroidism 06/30/2016  ? ?Past Medical History:  ?Diagnosis Date  ? Allergy   ? Arthritis   ? back   ? Atrial fibrillation ([place])   ? Atypical chest pain 06/30/2016  ? Back pain   ? DUE TO INJURY AT WORK ON05/2013  ? Bruises easily   ? Chronic lower back pain   ? Coronary artery disease   ? Headache(784.0)   ? rarely  ? History of bronchitis   ? last time about 9yrs ago   ? Hypertension   ? takes Benicar daily  ? OSA (obstructive sleep  apnea) 04/28/2020  ? Preoperative evaluation of a medical condition to rule out surgical contraindications (TAR required) 09/12/2020  ? Pure hypercholesterolemia 05/31/2019  ? Stroke [facility])   ? no residual  ? Vitamin D deficiency   ? takes Vitamin D 2 times/wk  ? Weakness   ? and numbness right foot  ?  ?Family History  ?Problem Relation Age of Onset  ? Heart disease Mother   ? CAD Mother   ? Stroke Mother   ? Heart disease Sister   ? Heart attack Sister   ? Liver disease Brother   ? Other Father   ?     unknown medical history  ? CAD Brother   ? Colon cancer Neg Hx   ? Esophageal cancer Neg Hx   ? Rectal cancer Neg Hx   ? Stomach cancer Neg Hx   ?  ?Past Surgical  History:  ?Procedure Laterality Date  ? BACK SURGERY    ? CARPAL TUNNEL RELEASE Right   ? [place]  ? COLONOSCOPY    ? EXPLORATORY LAPAROTOMY  1991  ? "took out fatty tumor" (11/09/2012)  ? IR ANGIO INTRA EXTRACRAN SEL COM CAROTID INNOMINATE BILAT MOD SED  08/10/2019  ? IR ANGIO INTRA EXTRACRAN SEL COM CAROTID INNOMINATE BILAT MOD SED  10/07/2020  ? IR ANGIO INTRA EXTRACRAN SEL COM CAROTID INNOMINATE UNI L MOD SED  11/05/2020  ? IR ANGIO VERTEBRAL SEL SUBCLAVIAN INNOMINATE BILAT MOD SED  10/07/2020  ? IR ANGIO VERTEBRAL SEL VERTEBRAL BILAT MOD SED  08/10/2019  ? IR CT HEAD LTD  11/05/2020  ? IR INTRAVSC STENT CERV CAROTID W/EMB-PROT MOD SED INCL ANGIO  11/05/2020  ? IR RADIOLOGIST EVAL & MGMT  11/21/2020  ? IR US GUIDE VASC ACCESS RIGHT  08/10/2019  ? IR US GUIDE VASC ACCESS RIGHT  10/07/2020  ? LEFT HEART CATH AND CORONARY ANGIOGRAPHY N/A 08/06/2016  ? Procedure: Left Heart Cath and Coronary Angiography;  Surgeon: Belva Crome, MD;  Location: [place] [facility];  Service: Cardiovascular;  Laterality: N/A;  ? LEFT HEART CATH AND CORONARY ANGIOGRAPHY N/A 07/12/2018  ? Procedure: LEFT HEART CATH AND CORONARY ANGIOGRAPHY;  Surgeon: Leonie Man, MD;  Location: [place] [facility];  Service: Cardiovascular;  Laterality: N/A;  ? LUMBAR LAMINECTOMY/DECOMPRESSION MICRODISCECTOMY  11/09/2012  ? "L3-4" (11/09/2012)  ? LUMBAR LAMINECTOMY/DECOMPRESSION MICRODISCECTOMY N/A 11/09/2012  ? Procedure: L3-L4 DECOMPRESSION AND MICRODISCECTOMY   (1 LEVEL);  Surgeon: Melina Schools, MD;  Location: [place];  Service: Orthopedics;  Laterality: N/A;  ? RADIOLOGY WITH ANESTHESIA N/A 11/05/2020  ? Procedure: STENTING;  Surgeon: Luanne Bras, MD;  Location: [place];  Service: Radiology;  Laterality: N/A;  ? TOTAL HIP ARTHROPLASTY Left 05/11/2021  ? Procedure: LEFT TOTAL HIP ARTHROPLASTY ANTERIOR APPROACH;  Surgeon: Leandrew Koyanagi, MD;  Location: [place];  Service: Orthopedics;  Laterality: Left;  3-C  ? ?Social History  ? ?Occupational  History  ? Occupation: Sports administrator  ?Tobacco Use  ? Smoking status: Never  ? Smokeless tobacco: Never  ?Vaping Use  ? Vaping Use: Never used  ?Substance and Sexual Activity  ? Alcohol use: Not Currently  ?  Alcohol/week: 2.0 standard drinks  ?  Types: 2 Glasses of wine per week  ?  Comment: social- 2 wine or 2 beers  ? Drug use: Never  ? Sexual activity: Yes  ?  Birth control/protection: Post-menopausal  ? ? ? ?

## 2021-06-15 ENCOUNTER — Ambulatory Visit
Admission: RE | Admit: 2021-06-15 | Discharge: 2021-06-15 | Disposition: A | Discharge status: Home / Self Care | Payer: 59 | Source: Ambulatory Visit | Attending: Obstetrics | Admitting: Obstetrics

## 2021-06-15 DIAGNOSIS — E2839 Other primary ovarian failure: Secondary | ICD-10-CM

## 2021-06-15 DIAGNOSIS — Z1239 Encounter for other screening for malignant neoplasm of breast: Secondary | ICD-10-CM

## 2021-06-15 IMAGING — MG MM DIGITAL SCREENING BILAT W/ TOMO AND CAD
6 of 12 series · 6 of 36 positions shown · non-contrast
Comparison: Previous exam(s).

CLINICAL DATA: Screening.

EXAM:
DIGITAL SCREENING BILATERAL MAMMOGRAM WITH TOMOSYNTHESIS AND CAD
TECHNIQUE: Bilateral screening digital craniocaudal and mediolateral oblique
mammograms were obtained. Bilateral screening digital breast
tomosynthesis was performed. The images were evaluated with
computer-aided detection.

[R CC synth-2D (1 of 2)]
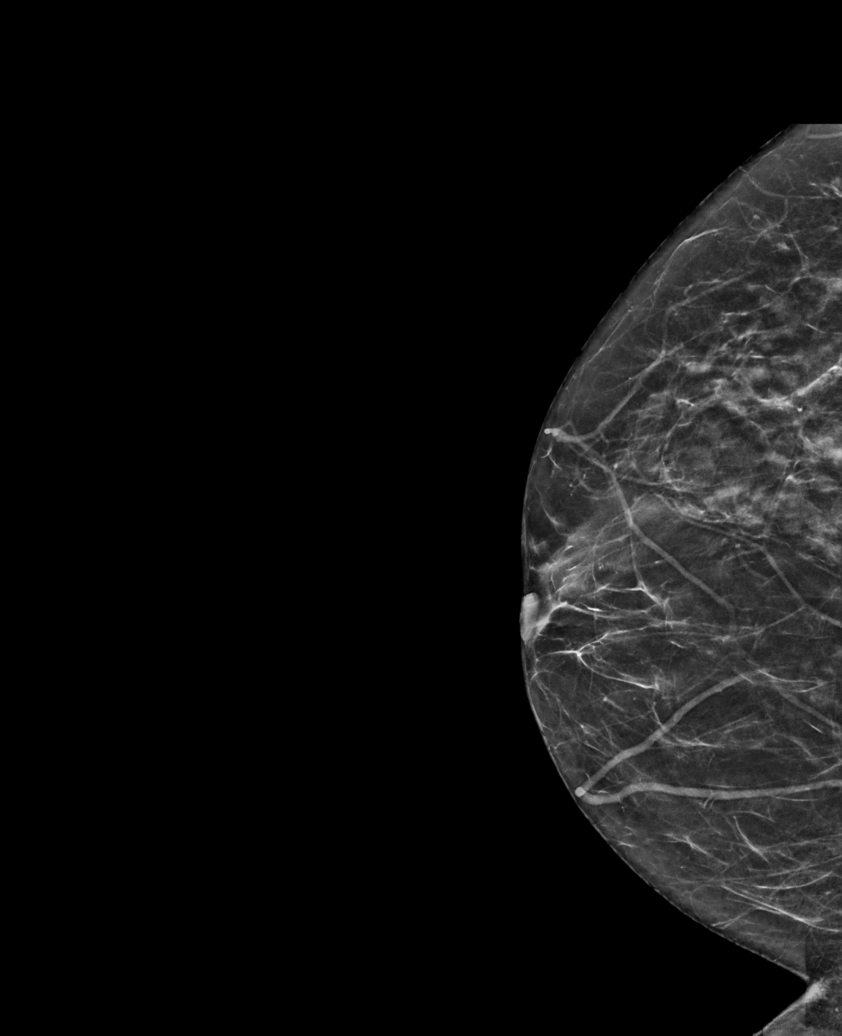

[R CC synth-2D (2 of 2)]
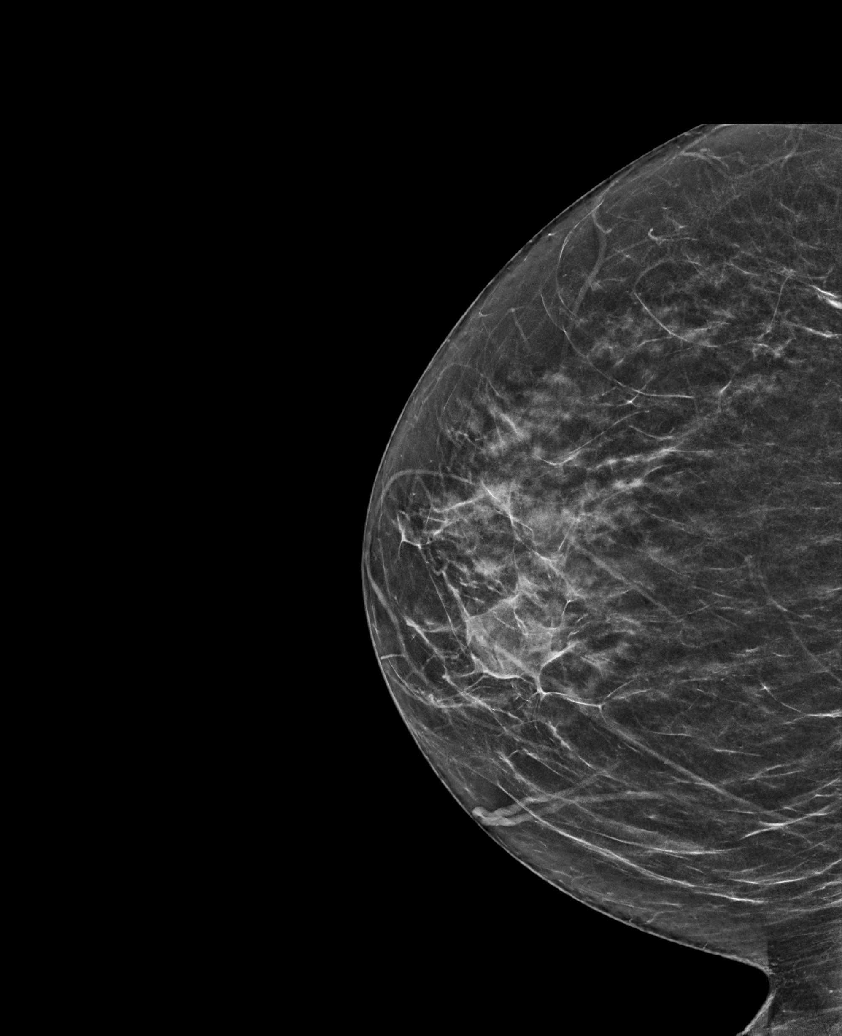

[R MLO synth-2D]
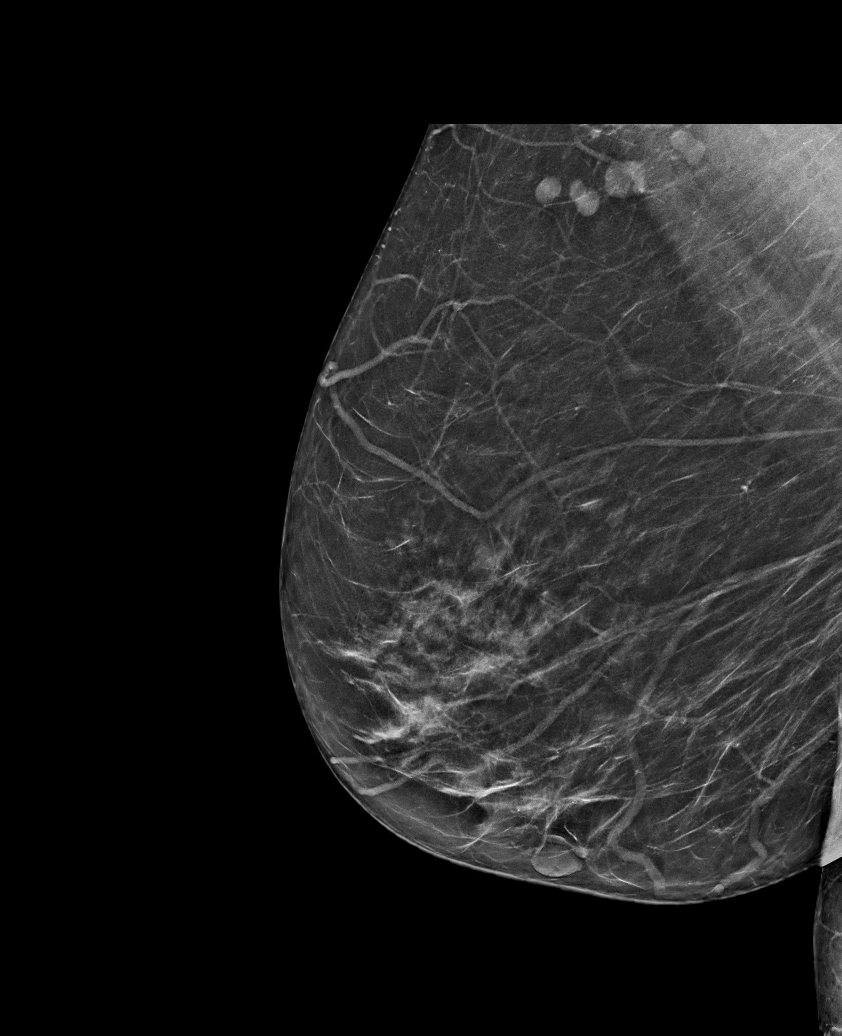

[L CC synth-2D (1 of 2)]
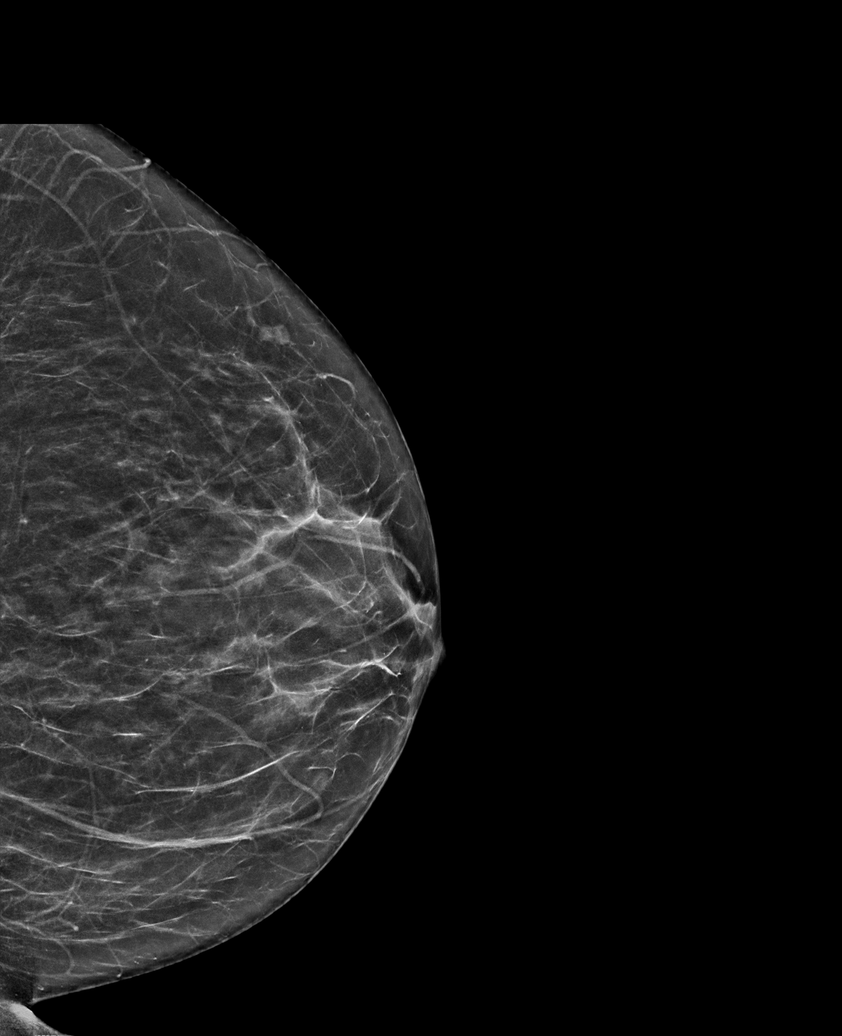

[L CC synth-2D (2 of 2)]
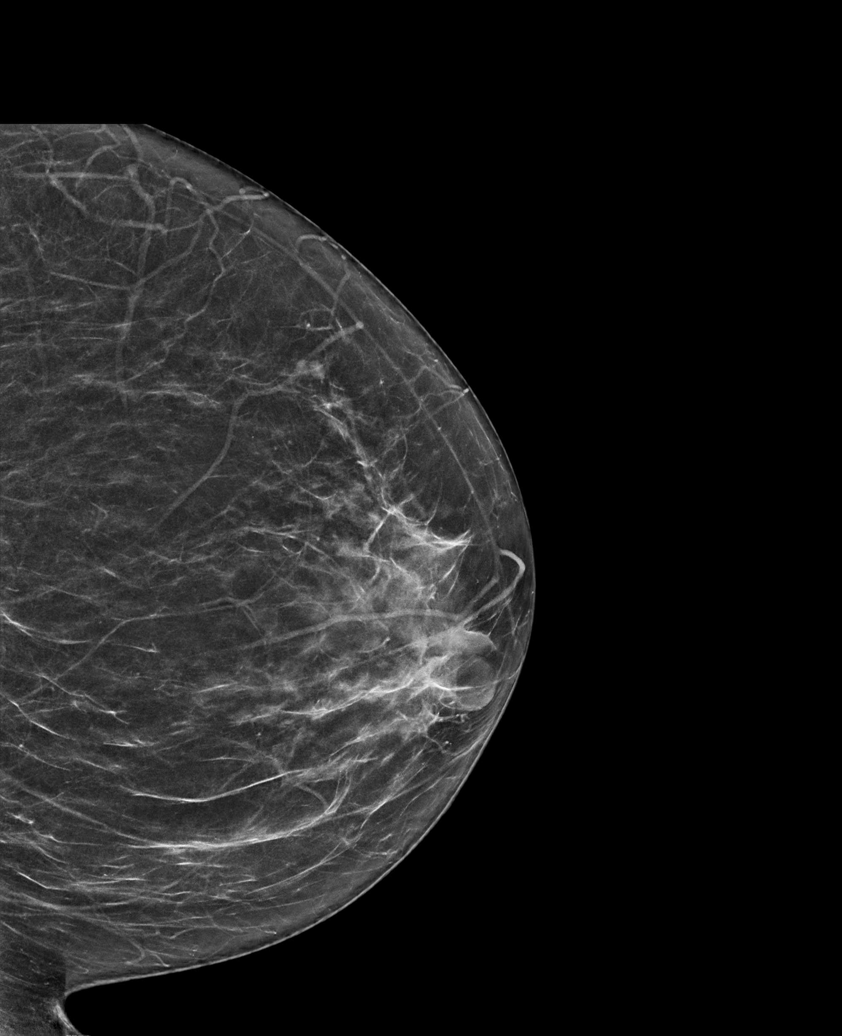

[L MLO synth-2D]
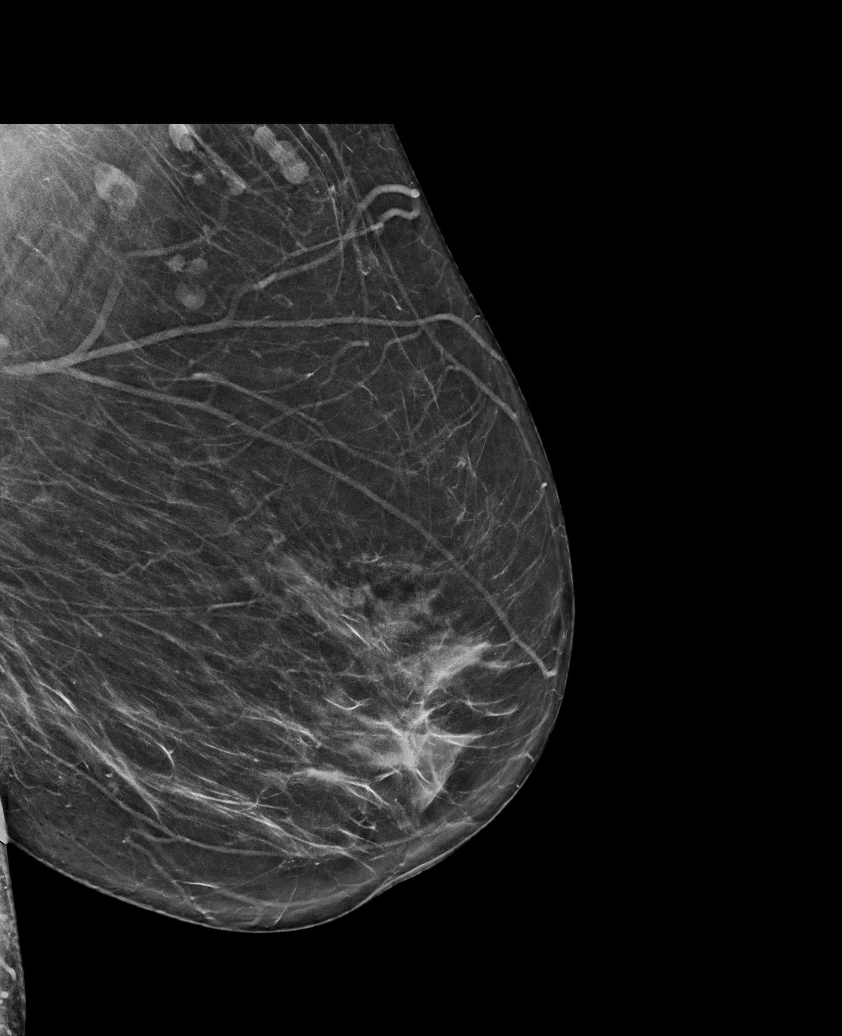

[6 of 36 positions shown; findings below may reference images not displayed]

ACR Breast Density Category b: There are scattered areas of
fibroglandular density.
FINDINGS: There are no findings suspicious for malignancy.
IMPRESSION: No mammographic evidence of malignancy. A result letter of this
screening mammogram will be mailed directly to the patient.

RECOMMENDATION:
Screening mammogram in one year. (Code:[BY])

BI-RADS CATEGORY  1: Negative.

## 2021-06-16 ENCOUNTER — Ambulatory Visit (INDEPENDENT_AMBULATORY_CARE_PROVIDER_SITE_OTHER): Payer: 59

## 2021-06-16 ENCOUNTER — Ambulatory Visit (INDEPENDENT_AMBULATORY_CARE_PROVIDER_SITE_OTHER): Payer: 59 | Admitting: Physician Assistant

## 2021-06-16 DIAGNOSIS — Z96642 Presence of left artificial hip joint: Secondary | ICD-10-CM

## 2021-06-16 NOTE — Progress Notes (Unsigned)
Post-Op Visit Note   Patient: [name]           Date of Birth: [birth year]           MRN: [id number] Visit Date: 06/16/2021 PCP: Glendale Chard, MD   Assessment & Plan:  Chief Complaint:  Chief Complaint  Patient presents with   Left Hip - Routine Post Op   Visit Diagnoses:  1. Status post total replacement of left hip     Plan: Patient is a pleasant 61 year old female who comes in today 5 weeks status post left total hip replacement 05/11/2021.  She has been doing relatively well.  She has recently finished home health physical therapy.  She is primarily ambulating with a cane.  She does note slight discomfort to the left leg at times and is taking Tylenol as needed.  Examination of the left hip reveals a painless logroll.  She is neurovascular intact distally.  At this point, she will continue to advance with activity as tolerated.  Dental prophylaxis reinforced.  Follow-up in 7 weeks time for repeat evaluation.  Call with concerns or questions in meantime.  Follow-Up Instructions: Return in about 7 weeks (around 08/04/2021).   Orders:  Orders Placed This Encounter  Procedures   XR Pelvis 1-2 Views   No orders of the defined types were placed in this encounter.   Imaging: DG BONE DENSITY (DXA)  Result Date: 06/15/2021 EXAM: DUAL X-RAY ABSORPTIOMETRY (DXA) FOR BONE MINERAL DENSITY IMPRESSION: Referring Physician:  Shelly Bombard Your patient completed a bone mineral density test using GE Lunar iDXA system (analysis version: 16). Technologist: [place] PATIENT: Name: Rebekah, Wagner Patient ID: [id number] Birth Date: 05-25-1960 Height: 61.0 in. Sex: Female Measured: 06/15/2021 Weight: 196.0 lbs. Indications: Estrogen Deficient, Left hip replaced, Postmenopausal Fractures: NONE Treatments: Vitamin D (E933.5) ASSESSMENT: The BMD measured at Femur Neck is 0.935 g/cm2 with a T-score of -0.7. This patient is considered normal according to [facility]) criteria.  The quality of the exam is good. Left hip excluded due to surgical hardware. Site Region Measured Date Measured Age YA BMD Significant CHANGE T-score Right Femur Neck 06[age]15/2021 61.3 -0.7 0.935 g/cm2 AP Spine L1-L4 06[age]15/2021 61.3 2.1 1.433 g/cm2 Left Forearm Radius 33% 06[age]15/2021 61.3 1.2 0.982 g/cm2 Right Femur Total 06/15/2021 61.3 0.6 1.082 g/cm2 World Health Organization [facility]) criteria for post-menopausal, Caucasian Women: Normal       T-score at or above -1 SD Osteopenia   T-score between -1 and -2.5 SD Osteoporosis T-score at or below -2.5 SD RECOMMENDATION: 1. All patients should optimize calcium and vitamin D intake. 2. Consider FDA-approved medical therapies in postmenopausal women and men aged 7 years and older, based on the following: a. A hip or vertebral (clinical or morphometric) fracture. b. T-score = -2.5 at the femoral neck or spine after appropriate evaluation to exclude secondary causes. c. Low bone mass (T-score between -1.0 and -2.5 at the femoral neck or spine) and a 10-year probability of a hip fracture = 3% or a 10-year probability of a major osteoporosis-related fracture = 20% based on the [place]-adapted WHO algorithm. d. Clinician judgment and/or patient preferences may indicate treatment for people with 10-year fracture probabilities above or below these levels. FOLLOW-UP: Patients with diagnosis of osteoporosis or at high risk for fracture should have regular bone mineral density tests.? Patients eligible for Medicare are allowed routine testing every 2 years.? The testing frequency can be increased to one year for patients who have rapidly progressing  disease, are receiving or discontinuing medical therapy to restore bone mass, or have additional risk factors. I have reviewed this study and agree with the findings. [facility] Electronically Signed   By: Ammie Ferrier M.D.   On: 06/15/2021 16:01   XR Pelvis 1-2 Views  Result Date: 06/16/2021 Well-seated prosthesis  without complication   PMFS History: Patient Active Problem List   Diagnosis Date Noted   Status post total replacement of left hip 05/11/2021   Abnormal glucose 02/21/2021   Class 2 severe obesity due to excess calories with serious comorbidity and body mass index (BMI) of 37.0 to 37.9 in adult [facility]) 02/21/2021   Stenosis of artery ([place]) 02/18/2021   Internal carotid artery stenosis, left 09/23/2020   Preoperative evaluation of a medical condition to rule out surgical contraindications (TAR required) 09/12/2020   Paroxysmal atrial fibrillation ([place]) 04/28/2020   OSA (obstructive sleep apnea) 04/28/2020   Atrial fibrillation with rapid ventricular response ([place]) 03/17/2020   Hypokalemia    Pulmonary hypertension, primary ([place])    Atrial fibrillation with RVR ([place]) 03/16/2020   Elevated troponin    Primary osteoarthritis of right knee 11/20/2019   Cerebrovascular accident (CVA) ([place]) 10/18/2019   ICAO (internal carotid artery occlusion), right 10/18/2019   Primary osteoarthritis of left hip 09/11/2019   SAH (subarachnoid hemorrhage) ([place]) [id number]   Acute embolic stroke ([place])    Left hip pain 06/25/2019   Severe left groin pain 06/21/2019   Bilateral carotid artery stenosis 06/21/2019   Pure hypercholesterolemia 05/31/2019   CAD in native artery [id number]   Complicated migraine [id number]   Hyperglycemia 07/10/2018   Abnormal stress test 08/06/2016   Essential hypertension 06/30/2016   Hypothyroidism 06/30/2016   Past Medical History:  Diagnosis Date   Allergy    Arthritis    back    Atrial fibrillation ([place])    Atypical chest pain 06/30/2016   Back pain    DUE TO INJURY AT WORK ON05/2013   Bruises easily    Chronic lower back pain    Coronary artery disease    Headache(784.0)    rarely   History of bronchitis    last time about 20yrs ago    Hypertension    takes Benicar daily   OSA (obstructive sleep apnea) 04/28/2020   Preoperative evaluation of a medical  condition to rule out surgical contraindications (TAR required) 09/12/2020   Pure hypercholesterolemia 05/31/2019   Stroke ([place])    no residual   Vitamin D deficiency    takes Vitamin D 2 times/wk   Weakness    and numbness right foot    Family History  Problem Relation Age of Onset   Heart disease Mother    CAD Mother    Stroke Mother    Heart disease Sister    Heart attack Sister    Liver disease Brother    Other Father        unknown medical history   CAD Brother    Colon cancer Neg Hx    Esophageal cancer Neg Hx    Rectal cancer Neg Hx    Stomach cancer Neg Hx     Past Surgical History:  Procedure Laterality Date   BACK SURGERY     CARPAL TUNNEL RELEASE Right    CESAREAN [place]   "took out fatty tumor" (11/09/2012)   IR ANGIO INTRA EXTRACRAN SEL COM CAROTID INNOMINATE BILAT MOD SED  08/10/2019   IR ANGIO INTRA EXTRACRAN SEL COM CAROTID INNOMINATE BILAT MOD SED  10/07/2020   IR ANGIO INTRA EXTRACRAN SEL COM CAROTID INNOMINATE UNI L MOD SED  11/05/2020   IR ANGIO VERTEBRAL SEL SUBCLAVIAN INNOMINATE BILAT MOD SED  10/07/2020   IR ANGIO VERTEBRAL SEL VERTEBRAL BILAT MOD SED  08/10/2019   IR CT HEAD LTD  11/05/2020   IR INTRAVSC STENT CERV CAROTID W/EMB-PROT MOD SED INCL ANGIO  11/05/2020   IR RADIOLOGIST EVAL & MGMT  11/21/2020   IR US GUIDE VASC ACCESS RIGHT  08/10/2019   IR US GUIDE VASC ACCESS RIGHT  10/07/2020   LEFT HEART CATH AND CORONARY ANGIOGRAPHY N/A 08/06/2016   Procedure: Left Heart Cath and Coronary Angiography;  Surgeon: Belva Crome, MD;  Location: [facility] CV LAB;  Service: Cardiovascular;  Laterality: N/A;   LEFT HEART CATH AND CORONARY ANGIOGRAPHY N/A 07/12/2018   Procedure: LEFT HEART CATH AND CORONARY ANGIOGRAPHY;  Surgeon: Leonie Man, MD;  Location: [place] CV LAB;  Service: Cardiovascular;  Laterality: N/A;   LUMBAR LAMINECTOMY/DECOMPRESSION MICRODISCECTOMY  11/09/2012   "L3-4"  (11/09/2012)   LUMBAR LAMINECTOMY/DECOMPRESSION MICRODISCECTOMY N/A 11/09/2012   Procedure: L3-L4 DECOMPRESSION AND MICRODISCECTOMY   (1 LEVEL);  Surgeon: Melina Schools, MD;  Location: [place];  Service: Orthopedics;  Laterality: N/A;   RADIOLOGY WITH ANESTHESIA N/A 11/05/2020   Procedure: STENTING;  Surgeon: Luanne Bras, MD;  Location: [place];  Service: Radiology;  Laterality: N/A;   TOTAL HIP ARTHROPLASTY Left 05/11/2021   Procedure: LEFT TOTAL HIP ARTHROPLASTY ANTERIOR APPROACH;  Surgeon: Leandrew Koyanagi, MD;  Location: [place];  Service: Orthopedics;  Laterality: Left;  3-C   Social History   Occupational History   Occupation: Sports administrator  Tobacco Use   Smoking status: Never   Smokeless tobacco: Never  Vaping Use   Vaping Use: Never used  Substance and Sexual Activity   Alcohol use: Not Currently    Alcohol/week: 2.0 standard drinks    Types: 2 Glasses of wine per week    Comment: social- 2 wine or 2 beers   Drug use: Never   Sexual activity: Yes    Birth control/protection: Post-menopausal

## 2021-06-18 ENCOUNTER — Ambulatory Visit: Payer: 59 | Admitting: Internal Medicine

## 2021-06-26 ENCOUNTER — Other Ambulatory Visit: Payer: Self-pay | Admitting: Internal Medicine

## 2021-06-30 ENCOUNTER — Other Ambulatory Visit: Payer: Self-pay | Admitting: Internal Medicine

## 2021-07-09 ENCOUNTER — Ambulatory Visit (INDEPENDENT_AMBULATORY_CARE_PROVIDER_SITE_OTHER): Payer: 59 | Admitting: Internal Medicine

## 2021-07-09 ENCOUNTER — Encounter: Payer: Self-pay | Admitting: Internal Medicine

## 2021-07-09 VITALS — BP 120/76 | HR 79 | Temp 98.1°F | Ht 61.0 in | Wt 197.8 lb

## 2021-07-09 DIAGNOSIS — Z2821 Immunization not carried out because of patient refusal: Secondary | ICD-10-CM

## 2021-07-09 DIAGNOSIS — Z6837 Body mass index (BMI) 37.0-37.9, adult: Secondary | ICD-10-CM | POA: Diagnosis not present

## 2021-07-09 DIAGNOSIS — R7303 Prediabetes: Secondary | ICD-10-CM | POA: Diagnosis not present

## 2021-07-09 DIAGNOSIS — I10 Essential (primary) hypertension: Secondary | ICD-10-CM | POA: Diagnosis not present

## 2021-07-09 MED ORDER — WEGOVY 1 MG/0.5ML ~~LOC~~ SOAJ
1.0000 mg | SUBCUTANEOUS | 0 refills | Status: DC
Start: 2021-07-09 — End: 2022-02-21

## 2021-07-09 NOTE — Patient Instructions (Signed)
WALKING  Walking is a great form of exercise to increase your strength, endurance and overall fitness.  A walking program can help you start slowly and gradually build endurance as you go.  Everyone's ability is different, so each person's starting point will be different.  You do not have to follow them exactly.  The are just samples. You should simply find out what's right for you and stick to that program.   In the beginning, you'll start off walking 2-3 times a day for short distances.  As you get stronger, you'll be walking further at just 1-2 times per day.  A. You Can Walk For A Certain Length Of Time Each Day    Walk 5 minutes 3 times per day.  Increase 2 minutes every 2 days (3 times per day).  Work up to 25-30 minutes (1-2 times per day).   Example:   Day 1-2 5 minutes 3 times per day   Day 7-8 12 minutes 2-3 times per day   Day 13-14 25 minutes 1-2 times per day  B. You Can Walk For a Certain Distance Each Day     Distance can be substituted for time.    Example:   3 trips to mailbox (at road)   3 trips to corner of block   3 trips around the block  C. Go to local high school and use the track.     Please only do the exercises that your therapist has initialed and dated 

## 2021-07-09 NOTE — Progress Notes (Signed)
Rich Brave [place],acting as a Education administrator for Maximino Greenland, MD.,have documented all relevant documentation on the behalf of Maximino Greenland, MD,as directed by  Maximino Greenland, MD while in the presence of Maximino Greenland, MD.  This visit occurred during the SARS-CoV-2 public health emergency.  Safety protocols were in place, including screening questions prior to the visit, additional usage of staff PPE, and extensive cleaning of exam room while observing appropriate contact time as indicated for disinfecting solutions.  Subjective:     Patient ID: [name] , female    DOB: [birth year] , 61 y.o.   MRN: [id number]   Chief Complaint  Patient presents with   Prediabetes   Hypertension    HPI  Patient presents today for a blood pressure f/u. She reports compliance with her meds. She denies headaches, chest pain and shortness of breath.  Since her last visit, she has had left hip replacement. She feels well and she is happy she has finally had this procedure. She is looking forward to resuming her walking regimen.   Hypertension This is a chronic problem. The current episode started more than 1 year ago. The problem has been gradually improving since onset. The problem is controlled. Pertinent negatives include no blurred vision, palpitations or shortness of breath. The current treatment provides moderate improvement. Compliance problems include exercise.      Past Medical History:  Diagnosis Date   Allergy    Arthritis    back    Atrial fibrillation ([place])    Atypical chest pain 06/30/2016   Back pain    DUE TO INJURY AT WORK ON05/2013   Bruises easily    Chronic lower back pain    Coronary artery disease    Headache(784.0)    rarely   History of bronchitis    last time about 75yr ago    Hypertension    takes Benicar daily   OSA (obstructive sleep apnea) 04/28/2020   Preoperative evaluation of a medical condition to rule out surgical contraindications (TAR required)  09/12/2020   Pure hypercholesterolemia 05/31/2019   [place]    no residual   Vitamin D deficiency    takes Vitamin D 2 times/wk   Weakness    and numbness right foot     Family History  Problem Relation Age of Onset   Heart disease Mother    CAD Mother    Stroke Mother    Heart disease Sister    Heart attack Sister    Liver disease Brother    Other Father        unknown medical history   CAD Brother    Colon cancer Neg Hx    Esophageal cancer Neg Hx    Rectal cancer Neg Hx    Stomach cancer Neg Hx      Current Outpatient Medications:    aspirin 81 MG chewable tablet, Chew 81 mg by mouth daily., Disp: , Rfl:    atorvastatin (LIPITOR) 80 MG tablet, TAKE 1 TABLET BY MOUTH DAILY AT 6 PM., Disp: 30 tablet, Rfl: 8   cholecalciferol (VITAMIN D3) 25 MCG (1000 UNIT) tablet, Take 1,000 Units by mouth daily. , Disp: , Rfl:    diltiazem (CARDIZEM CD) 120 MG 24 hr capsule, TAKE 1 CAPSULE BY MOUTH EVERY DAY, Disp: 30 capsule, Rfl: 5   docusate sodium (COLACE) 100 MG capsule, Take 1 capsule (100 mg total) by mouth daily as needed., Disp: 30 capsule, Rfl: 2   ELIQUIS 5  MG TABS tablet, TAKE 1 TABLET BY MOUTH TWICE A DAY, Disp: 180 tablet, Rfl: 1   ezetimibe (ZETIA) 10 MG tablet, TAKE 1 TABLET BY MOUTH EVERY DAY, Disp: 90 tablet, Rfl: 3   fluticasone (FLONASE) 50 MCG/ACT nasal spray, PLACE 2 SPRAYS IN BOTH NOSTRILS DAILY AS NEEDED FOR ALLERGIES OR RHINITIS, Disp: 48 mL, Rfl: 2   levocetirizine (XYZAL) 5 MG tablet, TAKE 1 TABLET BY MOUTH EVERY DAY, Disp: 30 tablet, Rfl: 5   Magnesium 250 MG TABS, TAKE 1 TABLET BY MOUTH DAILY WITH EVENING MEALS, Disp: 90 tablet, Rfl: 2   methocarbamol (ROBAXIN) 500 MG tablet, Take 1 tablet (500 mg total) by mouth 2 (two) times daily as needed. To be taken after surgery, Disp: 20 tablet, Rfl: 2   Olmesartan-amLODIPine-HCTZ 20-5-12.5 MG TABS, TAKE 1 TABLET BY MOUTH EVERY DAY, Disp: 90 tablet, Rfl: 1   ondansetron (ZOFRAN) 4 MG tablet, Take 1 tablet (4 mg  total) by mouth every 8 (eight) hours as needed for nausea or vomiting., Disp: 40 tablet, Rfl: 0   Semaglutide-Weight Management (WEGOVY) 1 MG/0.5ML SOAJ, Inject 1 mg into the skin once a week., Disp: 2 mL, Rfl: 0   ticagrelor (BRILINTA) 90 MG TABS tablet, Take 1 tablet (90 mg total) by mouth 2 (two) times daily., Disp: 60 tablet, Rfl: 3   topiramate (TOPAMAX) 100 MG tablet, Take 1 tablet (100 mg total) by mouth at bedtime., Disp: 90 tablet, Rfl: 4   traMADol (ULTRAM) 50 MG tablet, Take 1-2 tablets (50-100 mg total) by mouth daily as needed., Disp: 30 tablet, Rfl: 0   Allergies  Allergen Reactions   Other Itching and Other (See Comments)    Patient experienced intense itching in her arm that was accessed for a past angiogram     Review of Systems  Constitutional: Negative.   Eyes:  Negative for blurred vision.  Respiratory: Negative.  Negative for shortness of breath.   Cardiovascular: Negative.  Negative for palpitations.  Gastrointestinal: Negative.   Neurological: Negative.   Psychiatric/Behavioral: Negative.       Today's Vitals   07/09/21 1537  BP: 120/76  Pulse: 79  Temp: 98.1 F (36.7 C)  Weight: 197 lb 12.8 oz (89.7 kg)  Height: 5' 1"  (1.549 m)  PainSc: 0-No pain   Body mass index is 37.37 kg/m.  Wt Readings from Last 3 Encounters:  07/09/21 197 lb 12.8 oz (89.7 kg)  05/11/21 196 lb (88.9 kg)  04/29/21 198 lb (89.8 kg)    Objective:  Physical Exam Vitals and nursing note reviewed.  Constitutional:      Appearance: Normal appearance.  HENT:     Head: Normocephalic and atraumatic.  Eyes:     Extraocular Movements: Extraocular movements intact.  Cardiovascular:     Rate and Rhythm: Normal rate and regular rhythm.     Heart sounds: Normal heart sounds.  Pulmonary:     Effort: Pulmonary effort is normal.     Breath sounds: Normal breath sounds.  Abdominal:     Tenderness: There is right CVA tenderness.  Musculoskeletal:     Cervical back: Normal range of  motion.  Skin:    General: Skin is warm.  Neurological:     General: No focal deficit present.     Mental Status: She is alert.  Psychiatric:        Mood and Affect: Mood normal.        Behavior: Behavior normal.      Assessment And Plan:  1. Essential hypertension, benign Comments: Chronic, well controlled. She will c/w Tribenzor daily. She is encouraged to follow low sodium diet.  - BMP8+eGFR  2. Prediabetes Comments: Her a1c/glucose have been elevated in the past. I will recheck this today. She is encouraged to decrease her intake of sugary foods/beverages.  - CBC no Diff - Hemoglobin A1c - BMP8+eGFR  3. Herpes zoster vaccination declined  4. Class 2 severe obesity due to excess calories with serious comorbidity and body mass index (BMI) of 37.0 to 37.9 in adult [facility])  Patient has not met goal of at least 5% of body weight loss with comprehensive lifestyle modifications alone in the past 3-6 months. Pharmacotherapy is appropriate to pursue as augmentation. We discussed the use of Wegovy to address obesity. She understands concerns for product shortages and the concerns of starting/stopping in relation to GI distress.     Confirmed patient not pregnant and no personal or family history of medullary thyroid carcinoma (MTC) or Multiple Endocrine Neoplasia syndrome type 2 (MEN 2).    Advised patient on common side effects including nausea, diarrhea, dyspepsia, decreased appetite, and fatigue. Counseled patient on reducing meal size and how to titrate medication to minimize side effects. Patient aware to call if intolerable side effects or if experiencing dehydration, abdominal pain, or dizziness. Patient will adhere to dietary modifications and will target at least 150 minutes of moderate intensity exercise weekly.    Patient was given opportunity to ask questions. Patient verbalized understanding of the plan and was able to repeat key elements of the plan. All questions were  answered to their satisfaction.   I, Maximino Greenland, MD, have reviewed all documentation for this visit. The documentation on 07/09/21 for the exam, diagnosis, procedures, and orders are all accurate and complete.   IF YOU HAVE BEEN REFERRED TO A SPECIALIST, IT MAY TAKE 1-2 WEEKS TO SCHEDULE/PROCESS THE REFERRAL. IF YOU HAVE NOT HEARD FROM US/SPECIALIST IN TWO WEEKS, PLEASE GIVE [place] A CALL AT [phone] X [phone].   THE PATIENT IS ENCOURAGED TO PRACTICE SOCIAL DISTANCING DUE TO THE COVID-19 PANDEMIC.

## 2021-07-10 LAB — BMP8+EGFR
BUN/Creatinine Ratio: 20 (ref 12–28)
BUN: 19 mg/dL (ref 8–27)
CO2: 21 mmol/L (ref 20–29)
Calcium: 9.7 mg/dL (ref 8.7–10.3)
Chloride: 104 mmol/L (ref 96–106)
Creatinine, Ser: 0.94 mg/dL (ref 0.57–1.00)
Glucose: 94 mg/dL (ref 70–99)
Potassium: 4.1 mmol/L (ref 3.5–5.2)
Sodium: 141 mmol/L (ref 134–144)
eGFR: 69 mL/min/{1.73_m2} (ref >59)

## 2021-07-10 LAB — CBC
Hematocrit: 33.9 % — ABNORMAL LOW (ref 34.0–46.6)
Hemoglobin: 10.8 g/dL — ABNORMAL LOW (ref 11.1–15.9)
MCH: 27.8 pg (ref 26.6–33.0)
MCHC: 31.9 g/dL (ref 31.5–35.7)
MCV: 87 fL (ref 79–97)
Platelets: 336 10*3/uL (ref 150–450)
RBC: 3.88 x10E6/uL (ref 3.77–5.28)
RDW: 13.6 % (ref 11.7–15.4)
WBC: 8.8 10*3/uL (ref 3.4–10.8)

## 2021-07-10 LAB — HEMOGLOBIN A1C
Est. average glucose Bld gHb Est-mCnc: 123 mg/dL
Hgb A1c MFr Bld: 5.9 % — ABNORMAL HIGH (ref 4.8–5.6)

## 2021-07-28 ENCOUNTER — Telehealth: Payer: Self-pay | Admitting: Orthopaedic Surgery

## 2021-07-28 NOTE — Telephone Encounter (Signed)
Patient called in requesting a note for work stating her return to work date will be on 08/11/21

## 2021-07-28 NOTE — Telephone Encounter (Signed)
Called patient. No answer. LMOM that note will be up front for pick up.

## 2021-07-28 NOTE — Telephone Encounter (Signed)
yes

## 2021-07-29 ENCOUNTER — Telehealth (HOSPITAL_COMMUNITY): Payer: Self-pay

## 2021-07-29 ENCOUNTER — Telehealth (HOSPITAL_COMMUNITY): Payer: Self-pay | Admitting: Student

## 2021-07-29 MED ORDER — TICAGRELOR 90 MG PO TABS: 90.0000 mg | ORAL_TABLET | Freq: Two times a day (BID) | ORAL | 3 refills | Status: DC

## 2021-07-29 NOTE — Telephone Encounter (Signed)
Pt called for Brilinta refill. Sent a message to [place] to call into [facility]. AW

## 2021-07-29 NOTE — Progress Notes (Signed)
Patient called IR requesting a Brilinta refill. 90 mg BID x 30 days with 3 refills [facility]prescribed to [facility]. Patient had a follow up carotid duplex study in March and she was wondering when her next follow up will be and how long she needs to continue taking Brilinta. Someone from IR will discuss with Dr. Corliss Skains and we will get back in touch with the patient with further instructions.   Alwyn Ren, [place] [phone] 07/29/2021, 4:48 PM

## 2021-07-30 ENCOUNTER — Telehealth: Payer: Self-pay | Admitting: Student

## 2021-07-30 NOTE — Telephone Encounter (Signed)
Message received, patient would like to know when she can stop taking Brilinta.   Discussed with Dr. Corliss Skains, patient to continue to take Brilinta at least till next 6 month f/u US carotid, which will be scheduled in September 2023.   Called the patient to inform above discussion, no answer.   Lynann Bologna Cheney Gosch PA-C 07/30/2021 2:56 PM

## 2021-08-04 ENCOUNTER — Ambulatory Visit (INDEPENDENT_AMBULATORY_CARE_PROVIDER_SITE_OTHER): Payer: 59 | Admitting: Orthopaedic Surgery

## 2021-08-04 DIAGNOSIS — Z96642 Presence of left artificial hip joint: Secondary | ICD-10-CM

## 2021-08-04 MED ORDER — TRAMADOL HCL 50 MG PO TABS: 50.0000 mg | ORAL_TABLET | Freq: Every day | ORAL | 0 refills | Status: DC | PRN

## 2021-08-04 NOTE — Progress Notes (Signed)
Post-Op Visit Note   Patient: [name]           Date of Birth: 09-Nov-1960           MRN: [id number] Visit Date: 08/04/2021 PCP: Dorothyann Peng, MD   Assessment & Plan:  Chief Complaint:  Chief Complaint  Patient presents with   Left Hip - Routine Post Op   Visit Diagnoses:  1. Status post total replacement of left hip     Plan: [name] is 3 months status post left total hip on 05/11/2021.  She feels that overall she is about 90% recovered.  Planning to return back to work on 08/11/2021.  Feels a little bit of numbness in the lower leg but no complaints other wise.  Examination left hip shows a fully healed surgical scar.  Excellent range of motion.  Has a normal gait.  Dental prophylaxis reinforced.  Patient has done really well from her surgery.  She is noticed a remarkable improvement in her symptoms.  I refilled the tramadol for breakthrough pain.  Sounds like she has to do quite a bit of childcare for her grandkids.  Recheck in 3 months with standing AP pelvis and lateral hip x-rays.  Follow-Up Instructions: Return in about 3 months (around 11/04/2021).   Orders:  No orders of the defined types were placed in this encounter.  No orders of the defined types were placed in this encounter.   Imaging: No results found.  PMFS History: Patient Active Problem List   Diagnosis Date Noted   Status post total replacement of left hip 05/11/2021   Abnormal glucose 02/21/2021   Class 2 severe obesity due to excess calories with serious comorbidity and body mass index (BMI) of 37.0 to 37.9 in adult [facility]) 02/21/2021   Stenosis of artery (HCC) 02/18/2021   Internal carotid artery stenosis, left 09/23/2020   Preoperative evaluation of a medical condition to rule out surgical contraindications (TAR required) 09/12/2020   Paroxysmal atrial fibrillation (HCC) 04/28/2020   OSA (obstructive sleep apnea) 04/28/2020   Atrial fibrillation with rapid ventricular response (HCC)  03/17/2020   Hypokalemia    Pulmonary hypertension, primary (HCC)    Atrial fibrillation with RVR (HCC) 03/16/2020   Elevated troponin    Primary osteoarthritis of right knee 11/20/2019   Cerebrovascular accident (CVA) (HCC) 10/18/2019   ICAO (internal carotid artery occlusion), right 10/18/2019   Primary osteoarthritis of left hip 09/11/2019   SAH (subarachnoid hemorrhage) (HCC) 08/07/2019   Acute embolic stroke (HCC)    Left hip pain 06/25/2019   Severe left groin pain 06/21/2019   Bilateral carotid artery stenosis 06/21/2019   Pure hypercholesterolemia 05/31/2019   CAD in native artery 04/09/2019   Complicated migraine 08/23/2018   Hyperglycemia 07/10/2018   Abnormal stress test 08/06/2016   Essential hypertension 06/30/2016   Past Medical History:  Diagnosis Date   Allergy    Arthritis    back    Atrial fibrillation (HCC)    Atypical chest pain 06/30/2016   Back pain    DUE TO INJURY AT WORK ON05/2013   Bruises easily    Chronic lower back pain    Coronary artery disease    Headache(784.0)    rarely   History of bronchitis    last time about 46yrs ago    Hypertension    takes Benicar daily   OSA (obstructive sleep apnea) 04/28/2020   Preoperative evaluation of a medical condition to rule out surgical contraindications (TAR required) 09/12/2020  Pure hypercholesterolemia 05/31/2019   Stroke (HCC)    no residual   Vitamin D deficiency    takes Vitamin D 2 times/wk   Weakness    and numbness right foot    Family History  Problem Relation Age of Onset   Heart disease Mother    CAD Mother    Stroke Mother    Heart disease Sister    Heart attack Sister    Liver disease Brother    Other Father        unknown medical history   CAD Brother    Colon cancer Neg Hx    Esophageal cancer Neg Hx    Rectal cancer Neg Hx    Stomach cancer Neg Hx     Past Surgical History:  Procedure Laterality Date   BACK SURGERY     CARPAL TUNNEL RELEASE Right    CESAREAN  SECTION  1988   COLONOSCOPY     EXPLORATORY LAPAROTOMY  1991   "took out fatty tumor" (11/09/2012)   IR ANGIO INTRA EXTRACRAN SEL COM CAROTID INNOMINATE BILAT MOD SED  08/10/2019   IR ANGIO INTRA EXTRACRAN SEL COM CAROTID INNOMINATE BILAT MOD SED  10/07/2020   IR ANGIO INTRA EXTRACRAN SEL COM CAROTID INNOMINATE UNI L MOD SED  11/05/2020   IR ANGIO VERTEBRAL SEL SUBCLAVIAN INNOMINATE BILAT MOD SED  10/07/2020   IR ANGIO VERTEBRAL SEL VERTEBRAL BILAT MOD SED  08/10/2019   IR CT HEAD LTD  11/05/2020   IR INTRAVSC STENT CERV CAROTID W/EMB-PROT MOD SED INCL ANGIO  11/05/2020   IR RADIOLOGIST EVAL & MGMT  11/21/2020   IR US GUIDE VASC ACCESS RIGHT  08/10/2019   IR US GUIDE VASC ACCESS RIGHT  10/07/2020   LEFT HEART CATH AND CORONARY ANGIOGRAPHY N/A 08/06/2016   Procedure: Left Heart Cath and Coronary Angiography;  Surgeon: Lyn Records, MD;  Location: [facility] INVASIVE CV LAB;  Service: Cardiovascular;  Laterality: N/A;   LEFT HEART CATH AND CORONARY ANGIOGRAPHY N/A 07/12/2018   Procedure: LEFT HEART CATH AND CORONARY ANGIOGRAPHY;  Surgeon: Marykay Lex, MD;  Location[facility] INVASIVE CV LAB;  Service: Cardiovascular;  Laterality: N/A;   LUMBAR LAMINECTOMY/DECOMPRESSION MICRODISCECTOMY  11/09/2012   "L3-4" (11/09/2012)   LUMBAR LAMINECTOMY/DECOMPRESSION MICRODISCECTOMY N/A 11/09/2012   Procedure: L3-L4 DECOMPRESSION AND MICRODISCECTOMY   (1 LEVEL);  Surgeon: Venita Lick, MD;  Location: [facility] OR;  Service: Orthopedics;  Laterality: N/A;   RADIOLOGY WITH ANESTHESIA N/A 11/05/2020   Procedure: STENTING;  Surgeon: Julieanne Cotton, MD;  Location: [facility] OR;  Service: Radiology;  Laterality: N/A;   TOTAL HIP ARTHROPLASTY Left 05/11/2021   Procedure: LEFT TOTAL HIP ARTHROPLASTY ANTERIOR APPROACH;  Surgeon: Tarry Kos, MD;  Location: [facility] OR;  Service: Orthopedics;  Laterality: Left;  3-C   Social History   Occupational History   Occupation: Control and instrumentation engineer  Tobacco Use   Smoking status: Never    Smokeless tobacco: Never  Vaping Use   Vaping Use: Never used  Substance and Sexual Activity   Alcohol use: Not Currently    Alcohol/week: 2.0 standard drinks of alcohol    Types: 2 Glasses of wine per week    Comment: social- 2 wine or 2 beers   Drug use: Never   Sexual activity: Yes    Birth control/protection: Post-menopausal

## 2021-08-30 ENCOUNTER — Other Ambulatory Visit: Payer: Self-pay | Admitting: Nurse Practitioner

## 2021-08-30 DIAGNOSIS — L2489 Irritant contact dermatitis due to other agents: Secondary | ICD-10-CM

## 2021-09-06 ENCOUNTER — Other Ambulatory Visit: Payer: Self-pay | Admitting: Internal Medicine

## 2021-09-15 ENCOUNTER — Encounter (HOSPITAL_BASED_OUTPATIENT_CLINIC_OR_DEPARTMENT_OTHER): Payer: Self-pay | Admitting: Cardiovascular Disease

## 2021-09-15 ENCOUNTER — Ambulatory Visit (INDEPENDENT_AMBULATORY_CARE_PROVIDER_SITE_OTHER): Payer: 59 | Admitting: Cardiovascular Disease

## 2021-09-15 VITALS — BP 146/70 | HR 81 | Ht 61.0 in | Wt 203.0 lb

## 2021-09-15 DIAGNOSIS — I639 Cerebral infarction, unspecified: Secondary | ICD-10-CM

## 2021-09-15 DIAGNOSIS — E7800 Pure hypercholesterolemia, unspecified: Secondary | ICD-10-CM

## 2021-09-15 DIAGNOSIS — I6522 Occlusion and stenosis of left carotid artery: Secondary | ICD-10-CM

## 2021-09-15 DIAGNOSIS — I2510 Atherosclerotic heart disease of native coronary artery without angina pectoris: Secondary | ICD-10-CM | POA: Diagnosis not present

## 2021-09-15 DIAGNOSIS — I6523 Occlusion and stenosis of bilateral carotid arteries: Secondary | ICD-10-CM

## 2021-09-15 DIAGNOSIS — G4733 Obstructive sleep apnea (adult) (pediatric): Secondary | ICD-10-CM

## 2021-09-15 DIAGNOSIS — I10 Essential (primary) hypertension: Secondary | ICD-10-CM | POA: Diagnosis not present

## 2021-09-15 DIAGNOSIS — I4891 Unspecified atrial fibrillation: Secondary | ICD-10-CM

## 2021-09-15 DIAGNOSIS — I609 Nontraumatic subarachnoid hemorrhage, unspecified: Secondary | ICD-10-CM

## 2021-09-15 DIAGNOSIS — I270 Primary pulmonary hypertension: Secondary | ICD-10-CM

## 2021-09-15 DIAGNOSIS — I480 Paroxysmal atrial fibrillation: Secondary | ICD-10-CM

## 2021-09-15 NOTE — Patient Instructions (Addendum)
Medication Instructions:  STOP ASPIRIN   *If you need a refill on your cardiac medications before your next appointment, please call your pharmacy*  Lab Work: NONE  Testing/Procedures: NONE  Follow-Up: At [facility], you and your health needs are our priority.  As part of our continuing mission to provide you with exceptional heart care, we have created designated Provider Care Teams.  These Care Teams include your primary Cardiologist (physician) and Advanced Practice Providers (APPs -  Physician Assistants and Nurse Practitioners) who all work together to provide you with the care you need, when you need it.  We recommend signing up for the patient portal called "MyChart".  Sign up information is provided on this After Visit Summary.  MyChart is used to connect with patients for Virtual Visits (Telemedicine).  Patients are able to view lab/test results, encounter notes, upcoming appointments, etc.  Non-urgent messages can be sent to your provider as well.   To learn more about what you can do with MyChart, go to [email or web address].    Your next appointment:   03/16/2022 AT 9:40 AM WITH Chilton Si, MD    Other Instructions  MONITOR YOUR BLOOD PRESSURE TWICE A DAY AND CALL THE OFFICE IF ABOVE 140/90

## 2021-09-15 NOTE — Progress Notes (Signed)
Cardiology Office Note   Date:  09/15/2021   ID:  [name], DOB Mar 18, 1960, MRN [id number]  PCP:  Dorothyann Peng, MD  Cardiologist:  Chilton Si, MD  Electrophysiologist:  None   Evaluation Performed:  Follow-Up Visit  Chief Complaint:  CAD  History of Present Illness:    [name] is a 61 y.o. female CAD s/p PCI, PAF, hypertension, carotid stenosis, and stroke, who presents today for follow-up.  She was initially seen 06/2016 for an abnormal EKG.  Her sister died of a heart attack at age 61 without any preceding symptoms.  She also has a family history of long QT syndrome. Her mother, aunt, and 2 cousins all have this disease. She also reported intermittent episodes of atypical chest pain and was referred for an ETT 07/22/16 that revealed ST depressions.  She achieved 10.1 METs on the Bruce protocol.  She was referred for left heart catheterization that showed normal coronary arteries.   [name] was also noted to have a systolic murmur.  She was referred for an echo that revealed LVEF 65-70% and grade 2 diastolic dysfunction.  Pulmonary pressures were mildly elevated (PASP 48 mmHg).  [name] presented to the hospital with chest pain 07/2018.  She underwent LHC which showed 85% mid left circumflex disease with 30 to 40% stenoses in OM 2.  She underwent PCI of the left circumflex.  Left ventriculography revealed LVEF 55 to 65%.  She followed up with Joni Reining, DNP on 07/2018 and had some wrist pain but was otherwise well. Her BP was running low so olmesartan/HCTZ was reduced but not controlled. She switched to taking it twice daily.   She presented to the hospital 07/2019 with a subarachnoid hemorrhage on aspirin and plavix. She had a carotid angiogram that was 80-99% occluded on right and 30% on left. She was referred to vein and vascular follow-up. She had a CT-A of the neck 02/2020 which showed that her right ICA was 100% occluded, and there was 60-70% left ICA  stenosis.  She was seen in the hospital 03/2020 with chest pain and was found to be in atrial fibrillation with RVR. She presents today for pre-operative clearance prior to hip surgery.  She was anticoagulated with Eliquis.  At follow-up 09/2020 she was in sinus rhythm and doing well.  She underwent left hip replacement 05/2021.  Given her stroke history it was recommended that her anticoagulation be bridged.  [name] reports having a hip replacement on May 1st and returning to work on August 1st. She has been engaging in walking and gym exercises, with no reported chest pain or pressure.  She is unsure about any recent episodes of AFib.  Her blood pressure was high during the visit, but she has been monitoring it at home with readings ranging from 120/80 to 72/120. The patient has noticed weight gain and has been prescribed Wegovy but has not started it yet as she is awaiting insurance approval.  She has been experiencing easy bruising and has raised concerns about the medications they are taking, including Eliquis, aspirin, and Brilinta.   Past Medical History:  Diagnosis Date   Allergy    Arthritis    back    Atrial fibrillation (HCC)    Atypical chest pain 06/30/2016   Back pain    DUE TO INJURY AT WORK ON05/2013   Bruises easily    Chronic lower back pain    Coronary artery disease    Headache(784.0)  rarely   History of bronchitis    last time about 44yrs ago    Hypertension    takes Benicar daily   OSA (obstructive sleep apnea) 04/28/2020   Preoperative evaluation of a medical condition to rule out surgical contraindications (TAR required) 09/12/2020   Pure hypercholesterolemia 05/31/2019   Stroke (HCC)    no residual   Vitamin D deficiency    takes Vitamin D 2 times/wk   Weakness    and numbness right foot    Past Surgical History:  Procedure Laterality Date   BACK SURGERY     CARPAL TUNNEL RELEASE Right    CESAREAN SECTION  1988   COLONOSCOPY     EXPLORATORY  LAPAROTOMY  1991   "took out fatty tumor" (11/09/2012)   IR ANGIO INTRA EXTRACRAN SEL COM CAROTID INNOMINATE BILAT MOD SED  08/10/2019   IR ANGIO INTRA EXTRACRAN SEL COM CAROTID INNOMINATE BILAT MOD SED  10/07/2020   IR ANGIO INTRA EXTRACRAN SEL COM CAROTID INNOMINATE UNI L MOD SED  11/05/2020   IR ANGIO VERTEBRAL SEL SUBCLAVIAN INNOMINATE BILAT MOD SED  10/07/2020   IR ANGIO VERTEBRAL SEL VERTEBRAL BILAT MOD SED  08/10/2019   IR CT HEAD LTD  11/05/2020   IR INTRAVSC STENT CERV CAROTID W/EMB-PROT MOD SED INCL ANGIO  11/05/2020   IR RADIOLOGIST EVAL & MGMT  11/21/2020   IR US GUIDE VASC ACCESS RIGHT  08/10/2019   IR US GUIDE VASC ACCESS RIGHT  10/07/2020   LEFT HEART CATH AND CORONARY ANGIOGRAPHY N/A 08/06/2016   Procedure: Left Heart Cath and Coronary Angiography;  Surgeon: Lyn Records, MD;  Location: [facility] INVASIVE CV LAB;  Service: Cardiovascular;  Laterality: N/A;   LEFT HEART CATH AND CORONARY ANGIOGRAPHY N/A 07/12/2018   Procedure: LEFT HEART CATH AND CORONARY ANGIOGRAPHY;  Surgeon: Marykay Lex, MD;  Location: [facility] INVASIVE CV LAB;  Service: Cardiovascular;  Laterality: N/A;   LUMBAR LAMINECTOMY/DECOMPRESSION MICRODISCECTOMY  11/09/2012   "L3-4" (11/09/2012)   LUMBAR LAMINECTOMY/DECOMPRESSION MICRODISCECTOMY N/A 11/09/2012   Procedure: L3-L4 DECOMPRESSION AND MICRODISCECTOMY   (1 LEVEL);  Surgeon: Venita Lick, MD;  Location: [facility] OR;  Service: Orthopedics;  Laterality: N/A;   RADIOLOGY WITH ANESTHESIA N/A 11/05/2020   Procedure: STENTING;  Surgeon: Julieanne Cotton, MD;  Location: [facility] OR;  Service: Radiology;  Laterality: N/A;   TOTAL HIP ARTHROPLASTY Left 05/11/2021   Procedure: LEFT TOTAL HIP ARTHROPLASTY ANTERIOR APPROACH;  Surgeon: Tarry Kos, MD;  Location: [facility] OR;  Service: Orthopedics;  Laterality: Left;  3-C     Current Outpatient Medications  Medication Sig Dispense Refill   atorvastatin (LIPITOR) 80 MG tablet TAKE 1 TABLET BY MOUTH DAILY AT 6 PM. 30 tablet 8   cholecalciferol  (VITAMIN D3) 25 MCG (1000 UNIT) tablet Take 1,000 Units by mouth daily.      diltiazem (CARDIZEM CD) 120 MG 24 hr capsule TAKE 1 CAPSULE BY MOUTH EVERY DAY 30 capsule 5   ELIQUIS 5 MG TABS tablet TAKE 1 TABLET BY MOUTH TWICE A DAY 180 tablet 1   ezetimibe (ZETIA) 10 MG tablet TAKE 1 TABLET BY MOUTH EVERY DAY 90 tablet 3   fluticasone (FLONASE) 50 MCG/ACT nasal spray PLACE 2 SPRAYS IN BOTH NOSTRILS DAILY AS NEEDED FOR ALLERGIES OR RHINITIS 48 mL 2   levocetirizine (XYZAL) 5 MG tablet TAKE 1 TABLET BY MOUTH EVERY DAY 30 tablet 5   Magnesium 250 MG TABS TAKE 1 TABLET BY MOUTH DAILY WITH EVENING MEALS 90 tablet 2   Olmesartan-amLODIPine-HCTZ  20-5-12.5 MG TABS TAKE 1 TABLET BY MOUTH EVERY DAY 90 tablet 1   ticagrelor (BRILINTA) 90 MG TABS tablet Take 1 tablet (90 mg total) by mouth 2 (two) times daily. 60 tablet 3   topiramate (TOPAMAX) 100 MG tablet Take 1 tablet (100 mg total) by mouth at bedtime. 90 tablet 4   traMADol (ULTRAM) 50 MG tablet Take 1 tablet (50 mg total) by mouth daily as needed. 30 tablet 0   Semaglutide-Weight Management (WEGOVY) 1 MG/0.5ML SOAJ Inject 1 mg into the skin once a week. (Patient not taking: Reported on 09/15/2021) 2 mL 0   No current facility-administered medications for this visit.    Allergies:   Other    Social History:  The patient  reports that she has never smoked. She has never used smokeless tobacco. She reports that she does not currently use alcohol after a past usage of about 2.0 standard drinks of alcohol per week. She reports that she does not use drugs.   Family History:  The patient's family history includes CAD in her brother and mother; Heart attack in her sister; Heart disease in her mother and sister; Liver disease in her brother; Other in her father; Stroke in her mother.    ROS:  Please see the history of present illness. (+) Left hip pain All other systems are reviewed and negative.    PHYSICAL EXAM: VS:  BP (!) 146/70   Pulse 81   Ht  5\' 1"  (1.549 m)   Wt 203 lb (92.1 kg)   LMP 06/03/2011   BMI 38.36 kg/m  , BMI Body mass index is 38.36 kg/m. GENERAL:  Well appearing HEENT: Pupils equal round and reactive, fundi not visualized, oral mucosa unremarkable NECK:  No jugular venous distention, waveform within normal limits, +R carotid bruit LUNGS:  Clear to auscultation bilaterally HEART:  RRR.  PMI not displaced or sustained,S1 and S2 within normal limits, no S3, no S4, no clicks, no rubs, no murmurs ABD:  Flat, positive bowel sounds normal in frequency in pitch, no bruits, no rebound, no guarding, no midline pulsatile mass, no hepatomegaly, no splenomegaly EXT:  2 plus pulses throughout, no edema, no cyanosis no clubbing SKIN:  No rashes no nodules NEURO:  Cranial nerves II through XII grossly intact, motor grossly intact throughout PSYCH:  Cognitively intact, oriented to person place and time   EKG:   09/15/21: Sinus rhythm.  Rate 81 bpm 09/12/2020: Sinus rhythm. Rate 84 bpm. Nonspecific T wave abnormalities. 06/30/16: sinus rhythm. Rate 78 bpm.   Echo 03/17/2020: 1. Left ventricular ejection fraction, by estimation, is 60 to 65%. The  left ventricle has normal function. The left ventricle has no regional  wall motion abnormalities. There is mild concentric left ventricular  hypertrophy. Left ventricular diastolic  parameters are consistent with Grade I diastolic dysfunction (impaired  relaxation).   2. Right ventricular systolic function is normal. The right ventricular  size is mildly enlarged. There is mildly elevated pulmonary artery  systolic pressure. The estimated right ventricular systolic pressure is  [id number] mmHg.   3. The mitral valve is grossly normal. Mild mitral valve regurgitation.   4. Tricuspid valve regurgitation is mild to moderate.   5. There is flow acceleration noted in the LVOT without evidence of SAM.  Likely related to LVOT narrowing during systole in the setting of septal  hypertrophy. Flow  acceleration occurs prior to the aortic valve with no  evidence of aortic stenosis. DI  0.7.   6.  The aortic valve is tricuspid. There is mild calcification of the  aortic valve. There is mild thickening of the aortic valve. Aortic valve  regurgitation is mild. Mild aortic valve sclerosis is present, with no  evidence of aortic valve stenosis.   7. The inferior vena cava is normal in size with greater than 50%  respiratory variability, suggesting right atrial pressure of 3 mmHg.   Comparison(s): Compared to prior TTE in 08/08/19, the flow acceleration  across the LVOT and degree of TR is better captured on the current study.  Otherwise, there is no significant change.   Echo 08/08/2019:  1. Left ventricular ejection fraction, by estimation, is 60 to 65%. The  left ventricle has normal function. The left ventricle has no regional  wall motion abnormalities. Left ventricular diastolic parameters are  consistent with Grade I diastolic  dysfunction (impaired relaxation).   2. Right ventricular systolic function is normal. The right ventricular  size is normal. There is moderately elevated pulmonary artery systolic  pressure.   3. The mitral valve is normal in structure. Trivial mitral valve  regurgitation. No evidence of mitral stenosis.   4. The aortic valve is normal in structure. Aortic valve regurgitation is  mild. No aortic stenosis is present.   5. The inferior vena cava is dilated in size with >50% respiratory  variability, suggesting right atrial pressure of 8 mmHg.   Carotid Arterial Duplex 06/27/2019: Summary:  Right Carotid: Velocities in the right ICA are consistent with a 80-99%                 stenosis.   Left Carotid: Velocities in the left ICA are consistent with a 40-59%  stenosis.   Vertebrals:  Bilateral vertebral arteries demonstrate antegrade flow.  Subclavians: Left subclavian artery was stenotic. Normal flow hemodynamics were seen in the right subclavian  artery.  LHC 07/12/18: CULPRIT LESION: Mid Cx lesion is 85% stenosed with 30-40% stenosed side branch in 2nd Mrg. A drug-eluting stent was successfully placed (crossing OM2) using a STENT SYNERGY DES 3X20 - post-dilated to 3.3 mm Post intervention, there is a 0% residual stenosis. ----------------------------------------- Mid LAD lesion is 30% stenosed. Prox RCA lesion is 25% stenosed. ----------------------------------------- The left ventricular systolic function is normal. The left ventricular ejection fraction is 55-65% by visual estimate. LV end diastolic pressure is normal.   SUMMARY Severe Single Vessel CAD mLCX (@ OM2) - s/p Successful DES PCI (Synergy DES 3.0 x 20 -- 3.3 mm) Otherwise mild disease in LAD & RCA Normal LVEF & normal to low EDP   RECOMMENDATIONS Overnight monitoring post-PCI with TR Band Removal  Continue aggressive Risk Factor Modification  ETT 07/22/16: Blood pressure demonstrated a normal response to exercise. Clinically negative Electrically positive for ischemia with significant ST changes beginning in Stage II. Excellent exercise tolerance  Echo 07/13/16: Study Conclusions   - Left ventricle: The cavity size was normal. Wall thickness was   normal. Systolic function was vigorous. The estimated ejection   fraction was in the range of 65% to 70%. Wall motion was normal;   there were no regional wall motion abnormalities. Features are   consistent with a pseudonormal left ventricular filling pattern,   with concomitant abnormal relaxation and increased filling   pressure (grade 2 diastolic dysfunction). - Mitral valve: There was mild regurgitation. - Pulmonary arteries: Systolic pressure was moderately increased.   PA peak pressure: 48 mm Hg (S).  Recent Labs: 11/04/2020: Magnesium 2.2 04/29/2021: ALT 23 07/09/2021: BUN 19; Creatinine,  Ser 0.94; Hemoglobin 10.8; Platelets 336; Potassium 4.1; Sodium 141   06/17/16: TSH 2.7 Hemoglobin A1c 5.9 Total  cholesterol 219, triglycerides 83, HDL 67, LDL 139  Lipid Panel    Component Value Date/Time   CHOL 99 (L) 11/12/2020 1456   TRIG 56 11/12/2020 1456   HDL 34 (L) 11/12/2020 1456   CHOLHDL 2.9 11/12/2020 1456   CHOLHDL 2.2 03/17/2020 0115   VLDL 8 03/17/2020 0115   LDLCALC 52 11/12/2020 1456     Wt Readings from Last 3 Encounters:  09/15/21 203 lb (92.1 kg)  07/09/21 197 lb 12.8 oz (89.7 kg)  05/11/21 196 lb (88.9 kg)      ASSESSMENT AND PLAN: No problem-specific Assessment & Plan notes found for this encounter.  #Hypertension:    - Blood pressure was elevated during the visit, but patient reports good control at home. Patient to continue monitoring blood pressure daily and report any significant changes. No medication adjustments at this time. Follow up with Dr. Baird Cancer in mid-November.  Continue diltiazem, olmesartan, amlodipine, and HCTZ.  # CAD s/p PCI: # Carotid stenosis: # Bruising and anticoagulation/antiplatelet therapy: # Hyperlipidemia:    - Patient is currently on Eliquis, aspirin, and Brilinta. Discontinue aspirin and continue Eliquis and Brilinta to reduce the risk of bruising while maintaining appropriate anticoagulation and antiplatelet therapy. Monitor for any changes in bruising or bleeding.  She has no angina.  Encouraged her to exercise at least 150 minutes weekly. Cholesterol levels are well-controlled on atorvastatin and Zetia. Encourage a heart-healthy diet and regular exercise. Follow up with neurology and vascular specialists for carotid artery monitoring.  # Atrial fibrillation:    - No recent episodes reported by the patient. Continue diltiazem and Eliquis.   Current medicines are reviewed at length with the patient today.  The patient does not have concerns regarding medicines.  The following changes have been made: none  Labs/ tests ordered today include:   Orders Placed This Encounter  Procedures   EKG 12-Lead     Disposition:   FU with  Temika Sutphin C. Oval Linsey, MD, [facility] in 6 months     Signed, Tamari Busic C. Oval Linsey, MD, [facility]  09/15/2021 6:34 PM    [place]

## 2021-09-20 ENCOUNTER — Other Ambulatory Visit: Payer: Self-pay | Admitting: Neurology

## 2021-10-04 ENCOUNTER — Other Ambulatory Visit: Payer: Self-pay | Admitting: Internal Medicine

## 2021-10-04 DIAGNOSIS — I10 Essential (primary) hypertension: Secondary | ICD-10-CM

## 2021-10-14 ENCOUNTER — Other Ambulatory Visit: Payer: Self-pay | Admitting: General Practice

## 2021-10-14 ENCOUNTER — Other Ambulatory Visit: Payer: Self-pay | Admitting: Nurse Practitioner

## 2021-10-14 ENCOUNTER — Other Ambulatory Visit: Payer: Self-pay | Admitting: Internal Medicine

## 2021-10-14 ENCOUNTER — Other Ambulatory Visit: Payer: Self-pay | Admitting: Neurology

## 2021-10-14 DIAGNOSIS — L2489 Irritant contact dermatitis due to other agents: Secondary | ICD-10-CM

## 2021-10-14 NOTE — Telephone Encounter (Signed)
Prescription refill request for Eliquis received. Indication: AF Last office visit: 09/15/21  Berneice Gandy MD Scr: 0.94 on 07/09/21 Age:  61 Weight: 92.1kg  Based on above findings Eliquis 5mg  twice daily is the appropriate doe.  Refill approved.

## 2021-10-21 ENCOUNTER — Telehealth (HOSPITAL_COMMUNITY): Payer: Self-pay

## 2021-10-21 NOTE — Telephone Encounter (Signed)
Called to schedule us carotid, no answer, left vm. AW  

## 2021-10-22 ENCOUNTER — Other Ambulatory Visit (HOSPITAL_COMMUNITY): Payer: Self-pay | Admitting: Interventional Radiology

## 2021-10-22 DIAGNOSIS — I771 Stricture of artery: Secondary | ICD-10-CM

## 2021-10-26 NOTE — Progress Notes (Addendum)
Patient: [name] Date of Birth: 11-Jun-1960  Reason for Visit: Follow up History from: Patient Primary Neurologist: Krista Blue  ASSESSMENT AND PLAN 61 y.o. year old female   1.  Stroke -History of right MCA stroke and subarachnoid hemorrhage in the setting of aspirin and Plavix  2.  Bilateral carotid artery stenosis -Post stent to left ICA in October 2022 with Dr. Estanislado Pandy -March 2023 cardiac ultrasound showed total occlusion of right ICA, left ICA stent 50 to 75% stenosis -Scheduled this week for repeat carotid ultrasound -Keep close follow up with Dr. Estanislado Pandy  3.  A-fib -Follows with cardiology, on Eliquis 5 mg twice daily  4.  Headache with migraine features -Doing excellent, continue Topamax 100 mg at bedtime  5.  OSA on CPAP -Encourage to use CPAP nightly, work on slight improvement with greater than 4 hours nightly usage -I will send an order for continued supplies as needed  [name] is a 61 year old female, seen in request by her primary care physician Dr. Baird Cancer, Bailey Mech for episode of visual loss, headaches, initial evaluation was on August 23, 2018.   I have reviewed and summarized the referring note from the referring physician.  She had past medical history of hyperlipidemia, hypertension, coronary artery disease, status post stent on July 12, 2018, currently taking aspirin and Plavix   She denies a previous history of migraine headaches, on August 18, 2018, while sitting at the computer, she noticed difficulty reading, then bilateral vision went out, she was able to lie down with eyes closed resting for 30 minutes, her vision came back normal, at the same time she noticed mild left temporal region throbbing headaches, when she went back to look at computer again, she experienced visual loss bilaterally again, was sent to the emergency room   I personally reviewed MRI of the brain no acute abnormality   MRA of neck: 50% stenosis of left internal  carotid artery, critical stenosis of proximal right internal carotid artery, estimated to be greater than 80% stenosis, 50% stenosis of the left internal carotid artery   MRA of the brain showed no significant large vessel stenosis intracranially   She experienced a severe headache on August 08/2018, severe holoacranial pounding headache with associated light noise sensitivity, nauseous that was helped by Tylenol   Laboratory evaluations in 2020, LDL 148, cholesterol 217, negative C-reactive protein, ESR was mildly elevated 50, UDS was negative, normal TSH, A1c 5.9   UPDATE June 21 2019: Her headache has much improved, tolerating Topamax 100 mg every night   She remained on aspirin and Plavix, she did not have ultrasound of carotid artery to follow-up her severe right carotid artery stenosis, more than 80% based on MRA in 2020   She also complains of low back pain, radiating pain to left hip, left groin area   UPDATE Oct 18 2019: She is accompanied by her friends at today's clinical visit, we went over her medical history in detail   Ultrasound of carotid artery on June 27 2019 showed 80 to 99% stenosis of right internal carotid artery, left side was 40 to 59% stenosis   She was referred to vascular surgeon   X-ray of left hip showed end-stage degenerative changes of left hip, pending left hip replacement surgery   On August 07, 2019, she had severe headache on the right side, also describe left leg weakness.  MRI of the brain showed 2 subcentimeter foci of acute infarction in the right insular, affecting  the cortex, also subarachnoid hemorrhage within the sulci on the right   Four-vessel angiogram showed occluded right internal carotid artery at the bulb, 30% stenosis of left internal carotid artery   Repeat MRI of the brain on September 06, 2019 showed subtle hemosiderin within the right frontal sulci, no acute abnormality   Laboratory evaluations in September 2021 showed normal B12, CMP,  creatinine of 1.0, sodium of 133, normal CBC hemoglobin of 11.5, normal vitamin D 33.7, negative hepatitis C, A1c of 6.5,   UPDATE September 09 2020: I personally reviewed MRI of the brain on March 16, 2020, there was no acute abnormalities.   Laboratory evaluations in 2022: CBC, hemoglobin of 11, hematocrit of 35,   She is taking Topamax 110m qhs as migraine prevention.   I was able to review cardiologist Dr. RBlenda Mountsevaluation: 07/30/2020, new onset atrial fibrillation, with rapid ventricular rate, was put on Eliquis 5 mg twice daily  since.   Patient is at high risk for thromboembolic event due to her new onset paroxysmal atrial fibrillation, and also 100% occlusion of right internal carotid artery, mild to moderate stenosis of left internal carotid artery,   I will order repeat ultrasound of carotid artery to evaluate the stenosis on the left side   If she is to proceed with left hip replacement, she would need bridging therapy, either IV heparin or subcutaneous Lovenox to avoid the thromboembolic event  Update October 27, 2021 SS: 11/05/20 admitted for cerebral angiogram, stent was placed to the left proximal ICA.  Started on Brilinta along with Eliquis. 04/08/21 carotid ultrasound showed total occlusion of right ICA, left ICA stent 50 to 75% stenosis.  Scheduled for repeat carotid ultrasound this Friday.  Had a left hip replacement in July. Migraines are doing well, needs refill on Topamax 100 mg at bedtime, no migraines since taking, has ran out for 2 weeks, has had slight headache. Cardiology stopped aspirin. Uses CPAP, has card download, will bring by, no issues reported with machine. Does mention has form from [employer] Corporationneeds completed in the event of loss of power. Continues to work at [employer]  Addendum 11/19/2021 SS: She brings her CPAP card, report 10/20/2021 to 11/18/2021 shows usage 26/30 days at 87%.  Greater than 4 hours 73%.  Average usage days used 6.2 hours.  Minimum  pressure 6, maximum pressure 16.  Leak in the 95th percentile 31.8, AHI 3.9. looking at leak data, once she got a new nasal mask, no more leaks.   REVIEW OF SYSTEMS: Out of a complete 14 system review of symptoms, the patient complains only of the following symptoms, and all other reviewed systems are negative.  See HPI  ALLERGIES: Allergies  Allergen Reactions   Other Itching and Other (See Comments)    Patient experienced intense itching in her arm that was accessed for a past angiogram    HOME MEDICATIONS: Outpatient Medications Prior to Visit  Medication Sig Dispense Refill   atorvastatin (LIPITOR) 80 MG tablet TAKE 1 TABLET BY MOUTH DAILY AT 6 PM. 30 tablet 8   cholecalciferol (VITAMIN D3) 25 MCG (1000 UNIT) tablet Take 1,000 Units by mouth daily.      diltiazem (CARDIZEM CD) 120 MG 24 hr capsule TAKE 1 CAPSULE BY MOUTH EVERY DAY 30 capsule 5   ELIQUIS 5 MG TABS tablet TAKE 1 TABLET BY MOUTH TWICE A DAY 60 tablet 5   ezetimibe (ZETIA) 10 MG tablet TAKE 1 TABLET BY MOUTH EVERY DAY 90 tablet 3  fluticasone (FLONASE) 50 MCG/ACT nasal spray PLACE 2 SPRAYS IN BOTH NOSTRILS DAILY AS NEEDED FOR ALLERGIES OR RHINITIS 16 mL 8   levocetirizine (XYZAL) 5 MG tablet TAKE 1 TABLET BY MOUTH EVERY DAY 30 tablet 5   Magnesium 250 MG TABS TAKE 1 TABLET BY MOUTH DAILY WITH EVENING MEALS 90 tablet 2   Olmesartan-amLODIPine-HCTZ 20-5-12.5 MG TABS TAKE 1 TABLET BY MOUTH EVERY DAY 30 tablet 5   olmesartan-hydrochlorothiazide (BENICAR HCT) 40-25 MG tablet TAKE 1 TABLET BY MOUTH EVERY DAY 90 tablet 0   ticagrelor (BRILINTA) 90 MG TABS tablet Take 1 tablet (90 mg total) by mouth 2 (two) times daily. 60 tablet 3   traMADol (ULTRAM) 50 MG tablet Take 1 tablet (50 mg total) by mouth daily as needed. 30 tablet 0   topiramate (TOPAMAX) 100 MG tablet Take 1 tablet (100 mg total) by mouth at bedtime. 90 tablet 4   Semaglutide-Weight Management (WEGOVY) 1 MG/0.5ML SOAJ Inject 1 mg into the skin once a week.  (Patient not taking: Reported on 09/15/2021) 2 mL 0   No facility-administered medications prior to visit.    PAST MEDICAL HISTORY: Past Medical History:  Diagnosis Date   Allergy    Arthritis    back    Atrial fibrillation (HCC)    Atypical chest pain 06/30/2016   Back pain    DUE TO INJURY AT WORK ON05/2013   Bruises easily    Chronic lower back pain    Coronary artery disease    Headache(784.0)    rarely   History of bronchitis    last time about 37yr ago    Hypertension    takes Benicar daily   OSA (obstructive sleep apnea) 04/28/2020   Preoperative evaluation of a medical condition to rule out surgical contraindications (TAR required) 09/12/2020   Pure hypercholesterolemia 05/31/2019   [place]    [place]   Vitamin D deficiency    takes Vitamin D 2 times/wk   Weakness    and numbness right foot    PAST SURGICAL HISTORY: Past Surgical History:  Procedure Laterality Date   BACK SURGERY     CARPAL TUNNEL RELEASE Right    CESAREAN [place]  "took out fatty tumor" (11/09/2012)   IR ANGIO INTRA EXTRACRAN SEL COM CAROTID INNOMINATE BILAT MOD SED  08/10/2019   IR ANGIO INTRA EXTRACRAN SEL COM CAROTID INNOMINATE BILAT MOD SED  10/07/2020   IR ANGIO INTRA EXTRACRAN SEL COM CAROTID INNOMINATE UNI L MOD SED  11/05/2020   IR ANGIO VERTEBRAL SEL SUBCLAVIAN INNOMINATE BILAT MOD SED  10/07/2020   IR ANGIO VERTEBRAL SEL VERTEBRAL BILAT MOD SED  08/10/2019   IR CT HEAD LTD  11/05/2020   IR INTRAVSC STENT CERV CAROTID W/EMB-PROT MOD SED INCL ANGIO  11/05/2020   IR RADIOLOGIST EVAL & MGMT  11/21/2020   IR [place] ACC[place] RIGHT  08/10/2019   IR [place] V[place] ACCESS RIGHT  10/07/2020   LEFT HEART CATH AND CORONARY ANGIOGRAPHY N/A 08/06/2016   Procedure: Left Heart Cath and Coronary Angiography;  Surgeon: SBelva Crome MD;  [facility];  Service: Cardiovascular;  Laterality: N/A;   LEFT HEART CATH AND  CORONARY ANGIOGRAPHY N/A 07/12/2018   Procedure: LEFT HEART CATH AND CORONARY ANGIOGRAPHY;  Surgeon: HLeonie Man MD;  [facility];  Service: Cardiovascular;  Laterality: N/A;   LUMBAR LAMINECTOMY/DECOMPRESSION MICRODISCECTOMY  11/09/2012   "L3-4" (11/09/2012)   LUMBAR LAMINECTOMY/DECOMPRESSION MICRODISCECTOMY N/A 11/09/2012   Procedure: L3-L4 DECOMPRESSION AND MICRODISCECTOMY   (1 LEVEL);  Surgeon: Melina Schools, MD;  Location: [facility];  Service: Orthopedics;  Laterality: N/A;   RADIOLOGY WITH ANESTHESIA N/A 11/05/2020   Procedure: STENTING;  Surgeon: Luanne Bras, MD;  Location: [facility];  Service: Radiology;  Laterality: N/A;   TOTAL HIP ARTHROPLASTY Left 05/11/2021   Procedure: LEFT TOTAL HIP ARTHROPLASTY ANTERIOR APPROACH;  Surgeon: Leandrew Koyanagi, MD;  Location: [facility];  Service: Orthopedics;  Laterality: Left;  3-C    FAMILY HISTORY: Family History  Problem Relation Age of Onset   Heart disease Mother    CAD Mother    Stroke Mother    Heart disease Sister    Heart attack Sister    Liver disease Brother    Other Father        unknown medical history   CAD Brother    Colon cancer Neg Hx    Esophageal cancer Neg Hx    Rectal cancer Neg Hx    Stomach cancer Neg Hx     SOCIAL HISTORY: Social History   Socioeconomic History   Marital status: Divorced    Spouse name: Not on file   Number of children: 1   Years of education: Not on file   Highest education level: Some college, no degree  Occupational History   Occupation: Sports administrator  Tobacco Use   Smoking status: Never   Smokeless tobacco: Never  Vaping Use   Vaping Use: Never used  Substance and Sexual Activity   Alcohol use: Not Currently    Alcohol/week: 2.0 standard drinks of alcohol    Types: 2 Glasses of wine per week    Comment: social- 2 wine or 2 beers   Drug use: Never   Sexual activity: Yes    Birth control/protection: Post-menopausal  Other Topics Concern   Not on  file  Social History Narrative   Right-handed.   Occasional caffeine.   Lives alone.   Social Determinants of Health   Financial Resource Strain: Not on file  Food Insecurity: Not on file  Transportation Needs: Not on file  Physical Activity: Not on file  Stress: Not on file  Social Connections: Not on file  Intimate Partner Violence: Not on file   PHYSICAL EXAM  Vitals:   10/27/21 1110 10/27/21 1148  BP: (!) 158/91 (!) 140/80  Pulse: 71   Weight: 203 lb 8 oz (92.3 kg)   Height: _0  (1.549 m)    Body mass index is 38.45 kg/m.  Generalized: Well developed, in no acute distress  Neurological examination  Mentation: Alert oriented to time, place, history taking. Follows all commands speech and language fluent Cranial nerve II-XII: Pupils were equal round reactive to light. Extraocular movements were full, visual field were full on confrontational test. Facial sensation and strength were normal.  Head turning and shoulder shrug  were normal and symmetric. Motor: The motor testing reveals 5 over 5 strength of all 4 extremities. Good symmetric motor tone is noted throughout.  Sensory: Sensory testing is intact to soft touch on all 4 extremities. No evidence of extinction is noted.  Coordination: Cerebellar testing reveals good finger-nose-finger and heel-to-shin bilaterally.  Gait and station: Gait is normal. Tandem gait is normal. .  Reflexes: Deep tendon reflexes are symmetric and normal bilaterally.   DIAGNOSTIC DATA (LABS, IMAGING, TESTING) - I reviewed patient records, labs, notes, testing and  imaging myself where available.  Lab Results  Component Value Date   WBC 8.8 07/09/2021   HGB 10.8 (L) 07/09/2021   HCT 33.9 (L) 07/09/2021   MCV 87 07/09/2021   PLT 336 07/09/2021      Component Value Date/Time   NA 141 07/09/2021 1614   K 4.1 07/09/2021 1614   CL 104 07/09/2021 1614   CO2 21 07/09/2021 1614   GLUCOSE 94 07/09/2021 1614   GLUCOSE 101 (H) 04/29/2021 1325    BUN 19 07/09/2021 1614   CREATININE 0.94 07/09/2021 1614   CALCIUM 9.7 07/09/2021 1614   PROT 7.9 04/29/2021 1325   PROT 7.7 02/18/2021 1654   ALBUMIN 4.2 04/29/2021 1325   ALBUMIN 4.6 02/18/2021 1654   AST 20 04/29/2021 1325   ALT 23 04/29/2021 1325   ALKPHOS 77 04/29/2021 1325   BILITOT 1.1 04/29/2021 1325   BILITOT 0.4 02/18/2021 1654   GFRNONAA >60 04/29/2021 1325   GFRAA 70 09/18/2019 1622   Lab Results  Component Value Date   CHOL 99 (L) 11/12/2020   HDL 34 (L) 11/12/2020   LDLCALC 52 11/12/2020   TRIG 56 11/12/2020   CHOLHDL 2.9 11/12/2020   Lab Results  Component Value Date   HGBA1C 5.9 (H) 07/09/2021   Lab Results  Component Value Date   FDVOUZHQ60 479 09/18/2019   Lab Results  Component Value Date   TSH 1.978 03/17/2020    Butler Denmark, AGNP-C, DNP 10/27/2021, 11:49 AM [facility] [address] [place], [place] [address] [phone]

## 2021-10-27 ENCOUNTER — Encounter: Payer: Self-pay | Admitting: Neurology

## 2021-10-27 ENCOUNTER — Ambulatory Visit (INDEPENDENT_AMBULATORY_CARE_PROVIDER_SITE_OTHER): Payer: 59 | Admitting: Neurology

## 2021-10-27 VITALS — BP 140/80 | HR 71 | Ht 61.0 in | Wt 203.5 lb

## 2021-10-27 DIAGNOSIS — I6522 Occlusion and stenosis of left carotid artery: Secondary | ICD-10-CM | POA: Diagnosis not present

## 2021-10-27 DIAGNOSIS — I639 Cerebral infarction, unspecified: Secondary | ICD-10-CM | POA: Diagnosis not present

## 2021-10-27 DIAGNOSIS — G43109 Migraine with aura, not intractable, without status migrainosus: Secondary | ICD-10-CM | POA: Diagnosis not present

## 2021-10-27 DIAGNOSIS — G4733 Obstructive sleep apnea (adult) (pediatric): Secondary | ICD-10-CM

## 2021-10-27 MED ORDER — TOPIRAMATE 100 MG PO TABS: 100.0000 mg | ORAL_TABLET | Freq: Every day | ORAL | 4 refills | Status: DC

## 2021-10-27 NOTE — Patient Instructions (Signed)
Please keep your appointment for carotid ultrasound Let me know if you have any questions once results have returned Continue Topamax for her migraines Please bring your CPAP by for card download Otherwise I will see you back in 1 year or sooner if needed

## 2021-10-30 ENCOUNTER — Ambulatory Visit (HOSPITAL_COMMUNITY)
Admission: RE | Admit: 2021-10-30 | Discharge: 2021-10-30 | Disposition: A | Discharge status: Home / Self Care | Payer: 59 | Source: Ambulatory Visit | Attending: Interventional Radiology | Admitting: Interventional Radiology

## 2021-10-30 DIAGNOSIS — I771 Stricture of artery: Secondary | ICD-10-CM | POA: Diagnosis present

## 2021-11-03 ENCOUNTER — Encounter: Payer: Self-pay | Admitting: Orthopaedic Surgery

## 2021-11-03 ENCOUNTER — Ambulatory Visit (INDEPENDENT_AMBULATORY_CARE_PROVIDER_SITE_OTHER): Payer: 59 | Admitting: Orthopaedic Surgery

## 2021-11-03 ENCOUNTER — Ambulatory Visit (INDEPENDENT_AMBULATORY_CARE_PROVIDER_SITE_OTHER): Payer: 59

## 2021-11-03 DIAGNOSIS — Z96642 Presence of left artificial hip joint: Secondary | ICD-10-CM

## 2021-11-03 NOTE — Progress Notes (Signed)
Office Visit Note   Patient: [name]           Date of Birth: 07-Jun-1960           MRN: [id number] Visit Date: 11/03/2021              Requested by: Rebekah Peng, MD [address] [place],  [place] [address] PCP: Rebekah Peng, MD   Assessment & Plan: Visit Diagnoses:  1. Status post total replacement of left hip     Plan: Plan she is 6 months status post left total hip replacement.  She is very pleased with the outcome.  Feels that her thigh symptoms are likely due to her increased activity now that she has felt better from the hip replacement.  Would continue with symptomatic treatment as needed.  I would expect this to resolve with relative rest and activity modification and as her body gets stronger.  Recheck in 6 months with standing AP pelvis and lateral x-rays.  Follow-Up Instructions: Return in about 6 months (around 05/05/2022).   Orders:  Orders Placed This Encounter  Procedures   XR HIP UNILAT W OR W/O PELVIS 2-3 VIEWS LEFT   No orders of the defined types were placed in this encounter.     Procedures: No procedures performed   Clinical Data: No additional findings.   Subjective: Chief Complaint  Patient presents with   Left Hip - Follow-up    Left total hip arthroplasty 05/11/2021    HPI [name] is 6 months status post left total hip replacement on 05/11/2021.  She has been back to work for [employer].  She notices some tightness in the thigh is worse at the end of the day and sometimes at night.  Denies any constitutional symptoms.  Review of Systems   Objective: Vital Signs: LMP 06/03/2011   Physical Exam  Ortho Exam Examination of the left hip shows a fully healed surgical scar.  She has painless fluid range of motion of the hip.  She can now reach her toes.  There is no evidence of infection.  There is no warmth to the skin.  There is no tenderness. Specialty Comments:  No specialty comments  available.  Imaging: XR HIP UNILAT W OR W/O PELVIS 2-3 VIEWS LEFT  Result Date: 11/03/2021 AP and lateral x-rays of the left hip show stable left total hip replacement without any complications.  The components are well-positioned and well seated    PMFS History: Patient Active Problem List   Diagnosis Date Noted   Status post total replacement of left hip 05/11/2021   Abnormal glucose 02/21/2021   Class 2 severe obesity due to excess calories with serious comorbidity and body mass index (BMI) of 37.0 to 37.9 in adult [facility]) 02/21/2021   Internal carotid artery stenosis, left 09/23/2020   Preoperative evaluation of a medical condition to rule out surgical contraindications (TAR required) 09/12/2020   Paroxysmal atrial fibrillation (HCC) 04/28/2020   OSA (obstructive sleep apnea) 04/28/2020   Hypokalemia    Pulmonary hypertension, primary (HCC)    Elevated troponin    Primary osteoarthritis of right knee 11/20/2019   Cerebrovascular accident (CVA) (HCC) 10/18/2019   ICAO (internal carotid artery occlusion), right 10/18/2019   Primary osteoarthritis of left hip 09/11/2019   SAH (subarachnoid hemorrhage) (HCC) 08/07/2019   Acute embolic stroke (HCC)    Left hip pain 06/25/2019   Severe left groin pain 06/21/2019   Bilateral carotid artery stenosis 06/21/2019  Pure hypercholesterolemia 05/31/2019   CAD in native artery 69/67/8938   Complicated migraine 10/27/5100   Hyperglycemia 07/10/2018   Abnormal stress test 08/06/2016   Essential hypertension 06/30/2016   Past Medical History:  Diagnosis Date   Allergy    Arthritis    back    Atrial fibrillation (HCC)    Atypical chest pain 06/30/2016   Back pain    DUE TO INJURY AT WORK ON05/2013   Bruises easily    Chronic lower back pain    Coronary artery disease    Headache(784.0)    rarely   History of bronchitis    last time about 73yrs ago    Hypertension    takes Benicar daily   OSA (obstructive sleep apnea) 04/28/2020    Preoperative evaluation of a medical condition to rule out surgical contraindications (TAR required) 09/12/2020   Pure hypercholesterolemia 05/31/2019   Stroke ([place])    no residual   Vitamin D deficiency    takes Vitamin D 2 times/wk   Weakness    and numbness right foot    Family History  Problem Relation Age of Onset   Heart disease Mother    CAD Mother    Stroke Mother    Heart disease Sister    Heart attack Sister    Liver disease Brother    Other Father        unknown medical history   CAD Brother    Colon cancer Neg Hx    Esophageal cancer Neg Hx    Rectal cancer Neg Hx    Stomach cancer Neg Hx     Past Surgical History:  Procedure Laterality Date   BACK SURGERY     CARPAL TUNNEL RELEASE Right    CESAREAN [place]   "took out fatty tumor" (11/09/2012)   IR ANGIO INTRA EXTRACRAN SEL COM CAROTID INNOMINATE BILAT MOD SED  08/10/2019   IR ANGIO INTRA EXTRACRAN SEL COM CAROTID INNOMINATE BILAT MOD SED  10/07/2020   IR ANGIO INTRA EXTRACRAN SEL COM CAROTID INNOMINATE UNI L MOD SED  11/05/2020   IR ANGIO VERTEBRAL SEL SUBCLAVIAN INNOMINATE BILAT MOD SED  10/07/2020   IR ANGIO VERTEBRAL SEL VERTEBRAL BILAT MOD SED  08/10/2019   IR CT HEAD LTD  11/05/2020   IR INTRAVSC STENT CERV CAROTID W/EMB-PROT MOD SED INCL ANGIO  11/05/2020   IR RADIOLOGIST EVAL & MGMT  11/21/2020   IR US GUIDE VASC ACCESS RIGHT  08/10/2019   IR US GUIDE VASC ACCESS RIGHT  10/07/2020   LEFT HEART CATH AND CORONARY ANGIOGRAPHY N/A 08/06/2016   Procedure: Left Heart Cath and Coronary Angiography;  Surgeon: Belva Crome, MD;  Location: [facility];  Service: Cardiovascular;  Laterality: N/A;   LEFT HEART CATH AND CORONARY ANGIOGRAPHY N/A 07/12/2018   Procedure: LEFT HEART CATH AND CORONARY ANGIOGRAPHY;  Surgeon: Leonie Man, MD;  Location: [facility];  Service: Cardiovascular;  Laterality: N/A;   LUMBAR LAMINECTOMY/DECOMPRESSION  MICRODISCECTOMY  11/09/2012   "L3-4" (11/09/2012)   LUMBAR LAMINECTOMY/DECOMPRESSION MICRODISCECTOMY N/A 11/09/2012   Procedure: L3-L4 DECOMPRESSION AND MICRODISCECTOMY   (1 LEVEL);  Surgeon: Melina Schools, MD;  Location: [facility];  Service: Orthopedics;  Laterality: N/A;   RADIOLOGY WITH ANESTHESIA N/A 11/05/2020   Procedure: STENTING;  Surgeon: Luanne Bras, MD;  Location: [facility];  Service: Radiology;  Laterality: N/A;   TOTAL HIP ARTHROPLASTY Left 05/11/2021   Procedure:  LEFT TOTAL HIP ARTHROPLASTY ANTERIOR APPROACH;  Surgeon: Tarry Kos, MD;  [facility];  Service: Orthopedics;  Laterality: Left;  3-C   Social History   Occupational History   Occupation: Control and instrumentation engineer  Tobacco Use   Smoking status: Never   Smokeless tobacco: Never  Vaping Use   Vaping Use: Never used  Substance and Sexual Activity   Alcohol use: Not Currently    Alcohol/week: 2.0 standard drinks of alcohol    Types: 2 Glasses of wine per week    Comment: social- 2 wine or 2 beers   Drug use: Never   Sexual activity: Yes    Birth control/protection: Post-menopausal

## 2021-11-04 ENCOUNTER — Telehealth (HOSPITAL_COMMUNITY): Payer: Self-pay

## 2021-11-04 NOTE — Telephone Encounter (Signed)
Pt agreed to f/u in 6 months with a us carotid. AW 

## 2021-11-18 ENCOUNTER — Encounter: Payer: Self-pay | Admitting: Neurology

## 2021-11-18 ENCOUNTER — Other Ambulatory Visit: Payer: Self-pay | Admitting: Cardiovascular Disease

## 2021-11-18 NOTE — Telephone Encounter (Signed)
Pt called back. Stated she would bring CPAP machine and power cord tomorrow for download.

## 2021-11-18 NOTE — Telephone Encounter (Signed)
Rx(s) sent to pharmacy electronically.  

## 2021-11-19 NOTE — Telephone Encounter (Signed)
Pt came by the office and CPAP DL was obtained.   I have placed report in NP's office for review.

## 2021-11-19 NOTE — Addendum Note (Signed)
Addended by: Glean Salvo on: 11/19/2021 02:58 PM   Modules accepted: Orders

## 2021-11-23 ENCOUNTER — Ambulatory Visit (INDEPENDENT_AMBULATORY_CARE_PROVIDER_SITE_OTHER): Payer: 59 | Admitting: Internal Medicine

## 2021-11-23 ENCOUNTER — Encounter: Payer: Self-pay | Admitting: Internal Medicine

## 2021-11-23 VITALS — BP 140/68 | HR 100 | Temp 98.6°F | Ht 61.0 in | Wt 204.0 lb

## 2021-11-23 DIAGNOSIS — E559 Vitamin D deficiency, unspecified: Secondary | ICD-10-CM

## 2021-11-23 DIAGNOSIS — I10 Essential (primary) hypertension: Secondary | ICD-10-CM

## 2021-11-23 DIAGNOSIS — E7800 Pure hypercholesterolemia, unspecified: Secondary | ICD-10-CM

## 2021-11-23 DIAGNOSIS — R7303 Prediabetes: Secondary | ICD-10-CM | POA: Diagnosis not present

## 2021-11-23 DIAGNOSIS — R051 Acute cough: Secondary | ICD-10-CM | POA: Diagnosis not present

## 2021-11-23 DIAGNOSIS — Z0000 Encounter for general adult medical examination without abnormal findings: Secondary | ICD-10-CM | POA: Diagnosis not present

## 2021-11-23 DIAGNOSIS — Z23 Encounter for immunization: Secondary | ICD-10-CM | POA: Diagnosis not present

## 2021-11-23 LAB — POCT URINALYSIS DIPSTICK
Bilirubin, UA: NEGATIVE
Glucose, UA: NEGATIVE
Ketones, UA: NEGATIVE
Leukocytes, UA: NEGATIVE
Nitrite, UA: NEGATIVE
Protein, UA: NEGATIVE
Spec Grav, UA: 1.015 (ref 1.010–1.025)
Urobilinogen, UA: 0.2 E.U./dL
pH, UA: 5.5 (ref 5.0–8.0)

## 2021-11-23 MED ORDER — BENZONATATE 100 MG PO CAPS: 100.0000 mg | ORAL_CAPSULE | Freq: Three times a day (TID) | ORAL | 1 refills | Status: DC | PRN

## 2021-11-23 NOTE — Progress Notes (Signed)
Barnet Glasgow Martin,acting as a Education administrator for Maximino Greenland, MD.,have documented all relevant documentation on the behalf of Maximino Greenland, MD,as directed by  Maximino Greenland, MD while in the presence of Maximino Greenland, MD.   Subjective:     Patient ID: [name] , female    DOB: 1960-01-13 , 61 y.o.   MRN: [id number]   Chief Complaint  Patient presents with   Annual Exam   Hypertension    HPI  She is here today for a full physical examination. She is followed by GYN for her pelvic examinations- [facility].  She denies having any headaches, chest pain and palpitations.   BP Readings from Last 3 Encounters: 11/23/21 : (!) 140/70 10/27/21 : (!) 140/80 09/15/21 : (!) 146/70      Hypertension This is a chronic problem. The current episode started more than 1 year ago. The problem has been gradually improving since onset. The problem is controlled. Pertinent negatives include no blurred vision, chest pain, headaches, palpitations or shortness of breath. Risk factors for coronary artery disease include obesity, post-menopausal state, sedentary lifestyle and stress. Past treatments include angiotensin blockers, diuretics and beta blockers. The current treatment provides moderate improvement.     Past Medical History:  Diagnosis Date   Allergy    Arthritis    back    Atrial fibrillation ([place])    Atypical chest pain 06/30/2016   Back pain    DUE TO INJURY AT WORK ON05/2013   Bruises easily    Chronic lower back pain    Coronary artery disease    Headache(784.0)    rarely   History of bronchitis    last time about 27yr ago    Hypertension    takes Benicar daily   OSA (obstructive sleep apnea) 04/28/2020   Preoperative evaluation of a medical condition to rule out surgical contraindications (TAR required) 09/12/2020   Pure hypercholesterolemia 05/31/2019   Stroke ([place]    no residual   Vitamin D deficiency    takes Vitamin D 2 times/wk   Weakness    and  numbness right foot     Family History  Problem Relation Age of Onset   Heart disease Mother    CAD Mother    Stroke Mother    Heart disease Sister    Heart attack Sister    Liver disease Brother    Other Father        unknown medical history   CAD Brother    Colon cancer Neg Hx    Esophageal cancer Neg Hx    Rectal cancer Neg Hx    Stomach cancer Neg Hx      Current Outpatient Medications:    atorvastatin (LIPITOR) 80 MG tablet, TAKE 1 TABLET BY MOUTH DAILY AT 6 PM., Disp: 30 tablet, Rfl: 8   cholecalciferol (VITAMIN D3) 25 MCG (1000 UNIT) tablet, Take 1,000 Units by mouth daily. , Disp: , Rfl:    diltiazem (CARDIZEM CD) 120 MG 24 hr capsule, TAKE 1 CAPSULE BY MOUTH EVERY DAY, Disp: 30 capsule, Rfl: 5   ELIQUIS 5 MG TABS tablet, TAKE 1 TABLET BY MOUTH TWICE A DAY, Disp: 60 tablet, Rfl: 5   ezetimibe (ZETIA) 10 MG tablet, TAKE 1 TABLET BY MOUTH EVERY DAY, Disp: 90 tablet, Rfl: 2   fluticasone (FLONASE) 50 MCG/ACT nasal spray, PLACE 2 SPRAYS IN BOTH NOSTRILS DAILY AS NEEDED FOR ALLERGIES OR RHINITIS, Disp: 16 mL, Rfl: 8   levocetirizine (  XYZAL) 5 MG tablet, TAKE 1 TABLET BY MOUTH EVERY DAY, Disp: 30 tablet, Rfl: 5   Magnesium 250 MG TABS, TAKE 1 TABLET BY MOUTH DAILY WITH EVENING MEALS, Disp: 90 tablet, Rfl: 2   Olmesartan-amLODIPine-HCTZ 20-5-12.5 MG TABS, TAKE 1 TABLET BY MOUTH EVERY DAY, Disp: 30 tablet, Rfl: 5   Semaglutide-Weight Management (WEGOVY) 1 MG/0.5ML SOAJ, Inject 1 mg into the skin once a week., Disp: 2 mL, Rfl: 0   ticagrelor (BRILINTA) 90 MG TABS tablet, Take 1 tablet (90 mg total) by mouth 2 (two) times daily., Disp: 60 tablet, Rfl: 3   topiramate (TOPAMAX) 100 MG tablet, Take 1 tablet (100 mg total) by mouth at bedtime., Disp: 90 tablet, Rfl: 4   traMADol (ULTRAM) 50 MG tablet, Take 1 tablet (50 mg total) by mouth daily as needed., Disp: 30 tablet, Rfl: 0   benzonatate (TESSALON PERLES) 100 MG capsule, Take 1 capsule (100 mg total) by mouth 3 (three) times daily  as needed for cough., Disp: 30 capsule, Rfl: 1   Allergies  Allergen Reactions   Other Itching and Other (See Comments)    Patient experienced intense itching in her arm that was accessed for a past angiogram      The patient states she uses post menopausal status for birth control. Last LMP was Patient's last menstrual period was 06/03/2011.. Negative for Dysmenorrhea. Negative for: breast discharge, breast lump(s), breast pain and breast self exam. Associated symptoms include abnormal vaginal bleeding. Pertinent negatives include abnormal bleeding (hematology), anxiety, decreased libido, depression, difficulty falling sleep, dyspareunia, history of infertility, nocturia, sexual dysfunction, sleep disturbances, urinary incontinence, urinary urgency, vaginal discharge and vaginal itching. Diet regular.The patient states her exercise level is  intermittent.  . The patient's tobacco use is:  Social History   Tobacco Use  Smoking Status Never  Smokeless Tobacco Never  . She has been exposed to passive smoke. The patient's alcohol use is:  Social History   Substance and Sexual Activity  Alcohol Use Not Currently   Alcohol/week: 2.0 standard drinks of alcohol   Types: 2 Glasses of wine per week   Comment: social- 2 wine or 2 beers   Review of Systems  Constitutional: Negative.   HENT: Negative.    Eyes: Negative.  Negative for blurred vision.  Respiratory:  Positive for cough. Negative for shortness of breath.        Patient states she still has a nagging cough, her sx started about a week and a half ago. No fever/chills. Denies ill contacts. Cough is non-productive.    Cardiovascular: Negative.  Negative for chest pain and palpitations.  Gastrointestinal: Negative.   Endocrine: Negative.   Genitourinary: Negative.   Musculoskeletal: Negative.   Skin: Negative.   Allergic/Immunologic: Negative.   Neurological: Negative.  Negative for headaches.  Hematological: Negative.    Psychiatric/Behavioral: Negative.       Today's Vitals   11/23/21 1501 11/23/21 1513  BP: (!) 140/70 (!) 140/68  Pulse: 100   Temp: 98.6 F (37 C)   TempSrc: Oral   Weight: 204 lb (92.5 kg)   Height: _0  (1.549 m)   PainSc: 0-No pain    Body mass index is 38.55 kg/m.  Wt Readings from Last 3 Encounters:  11/23/21 204 lb (92.5 kg)  10/27/21 203 lb 8 oz (92.3 kg)  09/15/21 203 lb (92.1 kg)    Objective:  Physical Exam Vitals and nursing note reviewed.  Constitutional:      Appearance: Normal appearance.  HENT:  Head: Normocephalic and atraumatic.     Right Ear: Tympanic membrane, ear canal and external ear normal.     Left Ear: Tympanic membrane, ear canal and external ear normal.     Nose:     Comments: Masked     Mouth/Throat:     Comments: Masked  Eyes:     Extraocular Movements: Extraocular movements intact.     Conjunctiva/sclera: Conjunctivae normal.     Pupils: Pupils are equal, round, and reactive to light.  Cardiovascular:     Rate and Rhythm: Normal rate and regular rhythm.     Pulses: Normal pulses.     Heart sounds: Normal heart sounds.  Pulmonary:     Effort: Pulmonary effort is normal.     Breath sounds: Normal breath sounds.  Chest:  Breasts:    Tanner Score is 5.     Right: Normal.     Left: Normal.  Abdominal:     General: Abdomen is flat. Bowel sounds are normal.     Palpations: Abdomen is soft.  Genitourinary:    Comments: deferred Musculoskeletal:        General: Normal range of motion.     Cervical back: Normal range of motion and neck supple.  Skin:    General: Skin is warm and dry.  Neurological:     General: No focal deficit present.     Mental Status: She is alert and oriented to person, place, and time.  Psychiatric:        Mood and Affect: Mood normal.        Behavior: Behavior normal.     Assessment And Plan:     1. Annual physical exam Comments: A full exam was performed. Importance of monthly self breast exams  was discussed with the patient.  PATIENT IS ADVISED TO GET 30-45 MINUTES REGULAR EXERCISE NO LESS THAN FOUR TO FIVE DAYS PER WEEK - BOTH WEIGHTBEARING EXERCISES AND AEROBIC ARE RECOMMENDED.  PATIENT IS ADVISED TO FOLLOW A HEALTHY DIET WITH AT LEAST SIX FRUITS/VEGGIES PER DAY, DECREASE INTAKE OF RED MEAT, AND TO INCREASE FISH INTAKE TO TWO DAYS PER WEEK.  MEATS/FISH SHOULD NOT BE FRIED, BAKED OR BROILED IS PREFERABLE.  IT IS ALSO IMPORTANT TO CUT BACK ON YOUR SUGAR INTAKE. PLEASE AVOID ANYTHING WITH ADDED SUGAR, CORN SYRUP OR OTHER SWEETENERS. IF YOU MUST USE A SWEETENER, YOU CAN TRY STEVIA. IT IS ALSO IMPORTANT TO AVOID ARTIFICIALLY SWEETENERS AND DIET BEVERAGES. LASTLY, I SUGGEST WEARING SPF 50 SUNSCREEN ON EXPOSED PARTS AND ESPECIALLY WHEN IN THE DIRECT SUNLIGHT FOR AN EXTENDED PERIOD OF TIME.  PLEASE AVOID FAST FOOD RESTAURANTS AND INCREASE YOUR WATER INTAKE. - CBC no Diff - CMP14+EGFR - Lipid panel  2. Essential hypertension, benign Comments: Chronic, uncontrolled.  She will c/w generic Tribenzor 20/5/12.20m and Cardizem CD qd. (She is on 2 calcium channel blockers, also followed by Cardiology.)  She is encouraged to follow low sodium diet. She will f/u in six months for re-evaluation.  - POCT Urinalysis Dipstick (81002) - Microalbumin / Creatinine Urine Ratio - Amb Referral To Provider Referral Exercise Program (P.R.E.P)  3. Acute cough Comments: Her sx are likely due to PND. I will send rx Tessalon perles as requested. May also benefit from antihistamines as well. - benzonatate (TESSALON PERLES) 100 MG capsule; Take 1 capsule (100 mg total) by mouth 3 (three) times daily as needed for cough.  Dispense: 30 capsule; Refill: 1  4. Pure hypercholesterolemia Comments: Chronic, she will c/w atorvastatin 850mdaily.  Ideally, LDL goal <55. - Amb Referral To Provider Referral Exercise Program (P.R.E.P)  5. Prediabetes Comments: Her a1c has been elevated in the past. I will recheck this today. She is  encouraged to limit her intake of sugary beverages and foods. - Hemoglobin A1c - Amb Referral To Provider Referral Exercise Program (P.R.E.P)  6. Vitamin D deficiency Comments: I will check a vitamin D level and supplement as needed. - Vitamin D (25 hydroxy)  7. Need for influenza vaccination - Flu Vaccine QUAD 6+ mos PF IM (Fluarix Quad PF)  Patient was given opportunity to ask questions. Patient verbalized understanding of the plan and was able to repeat key elements of the plan. All questions were answered to their satisfaction.   I, Maximino Greenland, MD, have reviewed all documentation for this visit. The documentation on 11/23/21 for the exam, diagnosis, procedures, and orders are all accurate and complete.  THE PATIENT IS ENCOURAGED TO PRACTICE SOCIAL DISTANCING DUE TO THE COVID-19 PANDEMIC.

## 2021-11-23 NOTE — Patient Instructions (Signed)

## 2021-11-24 LAB — CMP14+EGFR
ALT: 24 IU/L (ref 0–32)
AST: 23 IU/L (ref 0–40)
Albumin/Globulin Ratio: 1.4 (ref 1.2–2.2)
Albumin: 4.8 g/dL (ref 3.9–4.9)
Alkaline Phosphatase: 91 IU/L (ref 44–121)
BUN/Creatinine Ratio: 17 (ref 12–28)
BUN: 17 mg/dL (ref 8–27)
Bilirubin Total: 0.6 mg/dL (ref 0.0–1.2)
CO2: 22 mmol/L (ref 20–29)
Calcium: 9.4 mg/dL (ref 8.7–10.3)
Chloride: 101 mmol/L (ref 96–106)
Creatinine, Ser: 1.03 mg/dL — ABNORMAL HIGH (ref 0.57–1.00)
Globulin, Total: 3.4 g/dL (ref 1.5–4.5)
Glucose: 98 mg/dL (ref 70–99)
Potassium: 3.9 mmol/L (ref 3.5–5.2)
Sodium: 141 mmol/L (ref 134–144)
Total Protein: 8.2 g/dL (ref 6.0–8.5)
eGFR: 62 mL/min/{1.73_m2} (ref >59)

## 2021-11-24 LAB — CBC
Hematocrit: 36.2 % (ref 34.0–46.6)
Hemoglobin: 12.1 g/dL (ref 11.1–15.9)
MCH: 28.8 pg (ref 26.6–33.0)
MCHC: 33.4 g/dL (ref 31.5–35.7)
MCV: 86 fL (ref 79–97)
Platelets: 330 10*3/uL (ref 150–450)
RBC: 4.2 x10E6/uL (ref 3.77–5.28)
RDW: 14.5 % (ref 11.7–15.4)
WBC: 8.4 10*3/uL (ref 3.4–10.8)

## 2021-11-24 LAB — LIPID PANEL
Chol/HDL Ratio: 1.9 ratio (ref 0.0–4.4)
Cholesterol, Total: 126 mg/dL (ref 100–199)
HDL: 68 mg/dL (ref >39)
LDL Chol Calc (NIH): 46 mg/dL (ref 0–99)
Triglycerides: 54 mg/dL (ref 0–149)
VLDL Cholesterol Cal: 12 mg/dL (ref 5–40)

## 2021-11-24 LAB — MICROALBUMIN / CREATININE URINE RATIO
Creatinine, Urine: 38.2 mg/dL
Microalb/Creat Ratio: <8 mg/g creat (ref 0–29)
Microalbumin, Urine: <3 ug/mL

## 2021-11-24 LAB — VITAMIN D 25 HYDROXY (VIT D DEFICIENCY, FRACTURES): Vit D, 25-Hydroxy: 40.9 ng/mL (ref 30.0–100.0)

## 2021-11-24 LAB — HEMOGLOBIN A1C
Est. average glucose Bld gHb Est-mCnc: 131 mg/dL
Hgb A1c MFr Bld: 6.2 % — ABNORMAL HIGH (ref 4.8–5.6)

## 2021-11-27 ENCOUNTER — Telehealth: Payer: Self-pay

## 2021-11-27 NOTE — Telephone Encounter (Signed)
Vmt pt requesting call back to discuss referral to PREP.  

## 2021-11-29 ENCOUNTER — Other Ambulatory Visit: Payer: Self-pay | Admitting: Cardiovascular Disease

## 2021-12-08 ENCOUNTER — Ambulatory Visit: Payer: 59

## 2021-12-08 VITALS — BP 132/72 | HR 94 | Temp 98.0°F

## 2021-12-08 DIAGNOSIS — E1165 Type 2 diabetes mellitus with hyperglycemia: Secondary | ICD-10-CM

## 2021-12-08 MED ORDER — MOUNJARO 5 MG/0.5ML ~~LOC~~ SOAJ: 5.0000 mg | SUBCUTANEOUS | 1 refills | Status: DC

## 2021-12-08 NOTE — Progress Notes (Signed)
Patient presents today for [facility] teaching. Pt self administered 2.5mg  today.  Instructions and Side effects discussed with patient.

## 2021-12-14 ENCOUNTER — Other Ambulatory Visit: Payer: Self-pay | Admitting: Student

## 2021-12-14 MED ORDER — TICAGRELOR 90 MG PO TABS
90.0000 mg | ORAL_TABLET | Freq: Two times a day (BID) | ORAL | 3 refills | Status: DC
Start: 2021-12-14 — End: 2022-04-20

## 2021-12-15 ENCOUNTER — Other Ambulatory Visit: Payer: Self-pay | Admitting: Internal Medicine

## 2021-12-15 ENCOUNTER — Ambulatory Visit: Payer: 59

## 2021-12-15 VITALS — BP 130/72 | HR 78 | Temp 98.8°F

## 2021-12-15 DIAGNOSIS — I10 Essential (primary) hypertension: Secondary | ICD-10-CM

## 2021-12-15 NOTE — Patient Instructions (Signed)
Hypertension, Adult High blood pressure (hypertension) is when the force of blood pumping through the arteries is too strong. The arteries are the blood vessels that carry blood from the heart throughout the body. Hypertension forces the heart to work harder to pump blood and may cause arteries to become narrow or stiff. Untreated or uncontrolled hypertension can lead to a heart attack, heart failure, a stroke, kidney disease, and other problems. A blood pressure reading consists of a higher number over a lower number. Ideally, your blood pressure should be below 120/80. The first ("top") number is called the systolic pressure. It is a measure of the pressure in your arteries as your heart beats. The second ("bottom") number is called the diastolic pressure. It is a measure of the pressure in your arteries as the heart relaxes. What are the causes? The exact cause of this condition is not known. There are some conditions that result in high blood pressure. What increases the risk? Certain factors may make you more likely to develop high blood pressure. Some of these risk factors are under your control, including: Smoking. Not getting enough exercise or physical activity. Being overweight. Having too much fat, sugar, calories, or salt (sodium) in your diet. Drinking too much alcohol. Other risk factors include: Having a personal history of heart disease, diabetes, high cholesterol, or kidney disease. Stress. Having a family history of high blood pressure and high cholesterol. Having obstructive sleep apnea. Age. The risk increases with age. What are the signs or symptoms? High blood pressure may not cause symptoms. Very high blood pressure (hypertensive crisis) may cause: Headache. Fast or irregular heartbeats (palpitations). Shortness of breath. Nosebleed. Nausea and vomiting. Vision changes. Severe chest pain, dizziness, and seizures. How is this diagnosed? This condition is diagnosed by  measuring your blood pressure while you are seated, with your arm resting on a flat surface, your legs uncrossed, and your feet flat on the floor. The cuff of the blood pressure monitor will be placed directly against the skin of your upper arm at the level of your heart. Blood pressure should be measured at least twice using the same arm. Certain conditions can cause a difference in blood pressure between your right and left arms. If you have a high blood pressure reading during one visit or you have normal blood pressure with other risk factors, you may be asked to: Return on a different day to have your blood pressure checked again. Monitor your blood pressure at home for 1 week or longer. If you are diagnosed with hypertension, you may have other blood or imaging tests to help your health care provider understand your overall risk for other conditions. How is this treated? This condition is treated by making healthy lifestyle changes, such as eating healthy foods, exercising more, and reducing your alcohol intake. You may be referred for counseling on a healthy diet and physical activity. Your health care provider may prescribe medicine if lifestyle changes are not enough to get your blood pressure under control and if: Your systolic blood pressure is above 130. Your diastolic blood pressure is above 80. Your personal target blood pressure may vary depending on your medical conditions, your age, and other factors. Follow these instructions at home: Eating and drinking  Eat a diet that is high in fiber and potassium, and low in sodium, added sugar, and fat. An example of this eating plan is called the DASH diet. DASH stands for Dietary Approaches to Stop Hypertension. To eat this way: Eat   plenty of fresh fruits and vegetables. Try to fill one half of your plate at each meal with fruits and vegetables. Eat whole grains, such as whole-wheat pasta, brown rice, or whole-grain bread. Fill about one  fourth of your plate with whole grains. Eat or drink low-fat dairy products, such as skim milk or low-fat yogurt. Avoid fatty cuts of meat, processed or cured meats, and poultry with skin. Fill about one fourth of your plate with lean proteins, such as fish, chicken without skin, beans, eggs, or tofu. Avoid pre-made and processed foods. These tend to be higher in sodium, added sugar, and fat. Reduce your daily sodium intake. Many people with hypertension should eat less than 1,500 mg of sodium a day. Do not drink alcohol if: Your health care provider tells you not to drink. You are pregnant, may be pregnant, or are planning to become pregnant. If you drink alcohol: Limit how much you have to: 0-1 drink a day for women. 0-2 drinks a day for men. Know how much alcohol is in your drink. In the [place] one drink equals one 12 oz bottle of beer (355 mL), one 5 oz glass of wine (148 mL), or one 1 oz glass of hard liquor (44 mL). Lifestyle  Work with your health care provider to maintain a healthy body weight or to lose weight. Ask what an ideal weight is for you. Get at least 30 minutes of exercise that causes your heart to beat faster (aerobic exercise) most days of the week. Activities may include walking, swimming, or biking. Include exercise to strengthen your muscles (resistance exercise), such as Pilates or lifting weights, as part of your weekly exercise routine. Try to do these types of exercises for 30 minutes at least 3 days a week. Do not use any products that contain nicotine or tobacco. These products include cigarettes, chewing tobacco, and vaping devices, such as e-cigarettes. If you need help quitting, ask your health care provider. Monitor your blood pressure at home as told by your health care provider. Keep all follow-up visits. This is important. Medicines Take over-the-counter and prescription medicines only as told by your health care provider. Follow directions carefully. Blood  pressure medicines must be taken as prescribed. Do not skip doses of blood pressure medicine. Doing this puts you at risk for problems and can make the medicine less effective. Ask your health care provider about side effects or reactions to medicines that you should watch for. Contact a health care provider if you: Think you are having a reaction to a medicine you are taking. Have headaches that keep coming back (recurring). Feel dizzy. Have swelling in your ankles. Have trouble with your vision. Get help right away if you: Develop a severe headache or confusion. Have unusual weakness or numbness. Feel faint. Have severe pain in your chest or abdomen. Vomit repeatedly. Have trouble breathing. These symptoms may be an emergency. Get help right away. Call 911. Do not wait to see if the symptoms will go away. Do not drive yourself to the hospital. Summary Hypertension is when the force of blood pumping through your arteries is too strong. If this condition is not controlled, it may put you at risk for serious complications. Your personal target blood pressure may vary depending on your medical conditions, your age, and other factors. For most people, a normal blood pressure is less than 120/80. Hypertension is treated with lifestyle changes, medicines, or a combination of both. Lifestyle changes include losing weight, eating a healthy,   low-sodium diet, exercising more, and limiting alcohol. This information is not intended to replace advice given to you by your health care provider. Make sure you discuss any questions you have with your health care provider. Document Revised: 11/04/2020 Document Reviewed: 11/04/2020 Elsevier Patient Education  2023 Elsevier Inc.  

## 2021-12-15 NOTE — Progress Notes (Signed)
Patient presents today for BPC. She is currently taking carizem 120mg  Olmesartan-amlodipine-hctz 02-15-10.5mg   and magnesium 250mg  some days.   BP Readings from Last 3 Encounters:  12/15/21 138/78  12/08/21 132/72  11/23/21 (!) 140/68  . Pt will follow up with provider in February. She will continue with current medication.

## 2021-12-21 ENCOUNTER — Other Ambulatory Visit: Payer: Self-pay | Admitting: Nurse Practitioner

## 2021-12-21 ENCOUNTER — Other Ambulatory Visit: Payer: Self-pay | Admitting: Internal Medicine

## 2021-12-21 ENCOUNTER — Other Ambulatory Visit: Payer: Self-pay | Admitting: Cardiovascular Disease

## 2021-12-21 DIAGNOSIS — L2489 Irritant contact dermatitis due to other agents: Secondary | ICD-10-CM

## 2022-01-17 ENCOUNTER — Other Ambulatory Visit: Payer: Self-pay | Admitting: Internal Medicine

## 2022-02-16 ENCOUNTER — Ambulatory Visit: Payer: 59 | Admitting: Internal Medicine

## 2022-02-16 ENCOUNTER — Telehealth: Payer: Self-pay

## 2022-02-16 ENCOUNTER — Encounter: Payer: Self-pay | Admitting: Internal Medicine

## 2022-02-16 VITALS — BP 132/78 | HR 72 | Temp 98.3°F | Ht 61.0 in | Wt 209.6 lb

## 2022-02-16 DIAGNOSIS — I10 Essential (primary) hypertension: Secondary | ICD-10-CM

## 2022-02-16 DIAGNOSIS — E1169 Type 2 diabetes mellitus with other specified complication: Secondary | ICD-10-CM | POA: Diagnosis not present

## 2022-02-16 DIAGNOSIS — Z6839 Body mass index (BMI) 39.0-39.9, adult: Secondary | ICD-10-CM

## 2022-02-16 DIAGNOSIS — R051 Acute cough: Secondary | ICD-10-CM

## 2022-02-16 DIAGNOSIS — I480 Paroxysmal atrial fibrillation: Secondary | ICD-10-CM

## 2022-02-16 DIAGNOSIS — I119 Hypertensive heart disease without heart failure: Secondary | ICD-10-CM

## 2022-02-16 DIAGNOSIS — D6869 Other thrombophilia: Secondary | ICD-10-CM

## 2022-02-16 DIAGNOSIS — E785 Hyperlipidemia, unspecified: Secondary | ICD-10-CM | POA: Diagnosis not present

## 2022-02-16 DIAGNOSIS — R0981 Nasal congestion: Secondary | ICD-10-CM | POA: Insufficient documentation

## 2022-02-16 DIAGNOSIS — E1165 Type 2 diabetes mellitus with hyperglycemia: Secondary | ICD-10-CM | POA: Insufficient documentation

## 2022-02-16 LAB — POC INFLUENZA A&B (BINAX/QUICKVUE)
Influenza A, POC: NEGATIVE
Influenza B, POC: NEGATIVE

## 2022-02-16 MED ORDER — MOUNJARO 5 MG/0.5ML ~~LOC~~ SOAJ: 5.0000 mg | SUBCUTANEOUS | 0 refills | Status: DC

## 2022-02-16 MED ORDER — OLMESARTAN-AMLODIPINE-HCTZ 20-5-12.5 MG PO TABS: 1.0000 | ORAL_TABLET | Freq: Every day | ORAL | 2 refills | Status: DC

## 2022-02-16 MED ORDER — TRIAMCINOLONE ACETONIDE 40 MG/ML IJ SUSP
40.0000 mg | Freq: Once | INTRAMUSCULAR | Status: AC
  Administered 2022-02-16: 40 mg via INTRAMUSCULAR

## 2022-02-16 MED ORDER — ATORVASTATIN CALCIUM 80 MG PO TABS: ORAL_TABLET | ORAL | 2 refills | Status: DC

## 2022-02-16 NOTE — Patient Instructions (Signed)
Fatigue If you have fatigue, you feel tired all the time and have a lack of energy or a lack of motivation. Fatigue may make it difficult to start or complete tasks because of exhaustion. Occasional or mild fatigue is often a normal response to activity or life. However, long-term (chronic) or extreme fatigue may be a symptom of a medical condition such as: Depression. Not having enough red blood cells or hemoglobin in the blood (anemia). A problem with a small gland located in the lower front part of the neck (thyroid disorder). Rheumatologic conditions. These are problems related to the body's defense system (immune system). Infections, especially certain viral infections. Fatigue can also lead to negative health outcomes over time. Follow these instructions at home: Medicines Take over-the-counter and prescription medicines only as told by your health care provider. Take a multivitamin if told by your health care provider. Do not use herbal or dietary supplements unless they are approved by your health care provider. Eating and drinking  Avoid heavy meals in the evening. Eat a well-balanced diet, which includes lean proteins, whole grains, plenty of fruits and vegetables, and low-fat dairy products. Avoid eating or drinking too many products with caffeine in them. Avoid alcohol. Drink enough fluid to keep your urine pale yellow. Activity  Exercise regularly, as told by your health care provider. Use or practice techniques to help you relax, such as yoga, tai chi, meditation, or massage therapy. Lifestyle Change situations that cause you stress. Try to keep your work and personal schedules in balance. Do not use recreational or illegal drugs. General instructions Monitor your fatigue for any changes. Go to bed and get up at the same time every day. Avoid fatigue by pacing yourself during the day and getting enough sleep at night. Maintain a healthy weight. Contact a health care  provider if: Your fatigue does not get better. You have a fever. You suddenly lose or gain weight. You have headaches. You have trouble falling asleep or sleeping through the night. You feel angry, guilty, anxious, or sad. You have swelling in your legs or another part of your body. Get help right away if: You feel confused, feel like you might faint, or faint. Your vision is blurry or you have a severe headache. You have severe pain in your abdomen, your back, or the area between your waist and hips (pelvis). You have chest pain, shortness of breath, or an irregular or fast heartbeat. You are unable to urinate, or you urinate less than normal. You have abnormal bleeding from the rectum, nose, lungs, nipples, or, if you are female, the vagina. You vomit blood. You have thoughts about hurting yourself or others. These symptoms may be an emergency. Get help right away. Call 911. Do not wait to see if the symptoms will go away. Do not drive yourself to the hospital. Get help right away if you feel like you may hurt yourself or others, or have thoughts about taking your own life. Go to your nearest emergency room or: Call 911. Call the National Suicide Prevention Lifeline at 1-800-273-8255 or 988. This is open 24 hours a day. Text the Crisis Text Line at 741741. Summary If you have fatigue, you feel tired all the time and have a lack of energy or a lack of motivation. Fatigue may make it difficult to start or complete tasks because of exhaustion. Long-term (chronic) or extreme fatigue may be a symptom of a medical condition. Exercise regularly, as told by your health care provider.   Change situations that cause you stress. Try to keep your work and personal schedules in balance. This information is not intended to replace advice given to you by your health care provider. Make sure you discuss any questions you have with your health care provider. Document Revised: 10/20/2020 Document  Reviewed: 10/20/2020 Elsevier Patient Education  2023 [employer] Inc.  

## 2022-02-16 NOTE — Telephone Encounter (Signed)
Looked at pharmacy benefits for PA for [facility]

## 2022-02-16 NOTE — Progress Notes (Signed)
I,Victoria T Hamilton,acting as a scribe for Maximino Greenland, MD.,have documented all relevant documentation on the behalf of Maximino Greenland, MD,as directed by  Maximino Greenland, MD while in the presence of Maximino Greenland, MD.    Subjective:     Patient ID: [name] , female    DOB: 1960/01/16 , 62 y.o.   MRN: [id number]   Chief Complaint  Patient presents with   Hypertension   Diabetes   URI    HPI  Patient presents today for a blood pressure f/u. She reports compliance with her meds. She denies headaches, chest pain and shortness of breath.   She reports being exposed to [place] at work. Feeling congested & fatigued. She also has coughed up yellow/ brown phlegm. Today is second day of her experiencing symptoms. She denies fever/chills.   Hypertension This is a chronic problem. The current episode started more than 1 year ago. The problem has been gradually improving since onset. The problem is controlled. Pertinent negatives include no blurred vision, palpitations or shortness of breath. The current treatment provides moderate improvement. Compliance problems include exercise.   Diabetes She presents for her follow-up diabetic visit. She has type 2 diabetes mellitus. There are no hypoglycemic associated symptoms. Associated symptoms include fatigue. Pertinent negatives for diabetes include no blurred vision. There are no hypoglycemic complications. Risk factors for coronary artery disease include diabetes mellitus, dyslipidemia, hypertension, obesity, post-menopausal and sedentary lifestyle. Meal planning includes avoidance of concentrated sweets. She has not had a previous visit with a dietitian. She participates in exercise intermittently. An ACE inhibitor/angiotensin II receptor blocker is being taken.     Past Medical History:  Diagnosis Date   Allergy    Arthritis    back    Atrial fibrillation ([place])    Atypical chest pain 06/30/2016   Back pain    DUE TO INJURY AT  WORK ON05/2013   Bruises easily    Chronic lower back pain    Coronary artery disease    Headache(784.0)    rarely   History of bronchitis    last time about 5yr ago    Hypertension    takes Benicar daily   OSA (obstructive sleep apnea) 04/28/2020   Preoperative evaluation of a medical condition to rule out surgical contraindications (TAR required) 09/12/2020   Pure hypercholesterolemia 05/31/2019   Stroke ([place]    no residual   Vitamin D deficiency    takes Vitamin D 2 times/wk   Weakness    and numbness right foot     Family History  Problem Relation Age of Onset   Heart disease Mother    CAD Mother    Stroke Mother    Heart disease Sister    Heart attack Sister    Liver disease Brother    Other Father        unknown medical history   CAD Brother    Colon cancer Neg Hx    Esophageal cancer Neg Hx    Rectal cancer Neg Hx    Stomach cancer Neg Hx      Current Outpatient Medications:    benzonatate (TESSALON PERLES) 100 MG capsule, Take 1 capsule (100 mg total) by mouth 3 (three) times daily as needed for cough., Disp: 30 capsule, Rfl: 1   cholecalciferol (VITAMIN D3) 25 MCG (1000 UNIT) tablet, Take 1,000 Units by mouth daily. , Disp: , Rfl:    diltiazem (CARDIZEM CD) 120 MG 24 hr capsule, TAKE 1 CAPSULE  BY MOUTH EVERY DAY, Disp: 30 capsule, Rfl: 5   ELIQUIS 5 MG TABS tablet, TAKE 1 TABLET BY MOUTH TWICE A DAY, Disp: 60 tablet, Rfl: 5   ezetimibe (ZETIA) 10 MG tablet, TAKE 1 TABLET BY MOUTH EVERY DAY, Disp: 90 tablet, Rfl: 2   fluticasone (FLONASE) 50 MCG/ACT nasal spray, PLACE 2 SPRAYS IN BOTH NOSTRILS DAILY AS NEEDED FOR ALLERGIES OR RHINITIS, Disp: 16 mL, Rfl: 8   levocetirizine (XYZAL) 5 MG tablet, TAKE 1 TABLET BY MOUTH EVERY DAY, Disp: 30 tablet, Rfl: 5   Magnesium 250 MG TABS, TAKE 1 TABLET BY MOUTH DAILY WITH EVENING MEALS, Disp: 90 tablet, Rfl: 2   ticagrelor (BRILINTA) 90 MG TABS tablet, Take 1 tablet (90 mg total) by mouth 2 (two) times daily., Disp: 60  tablet, Rfl: 3   tirzepatide (MOUNJARO) 5 MG/0.5ML Pen, Inject 5 mg into the skin once a week., Disp: 2 mL, Rfl: 0   topiramate (TOPAMAX) 100 MG tablet, Take 1 tablet (100 mg total) by mouth at bedtime., Disp: 90 tablet, Rfl: 4   traMADol (ULTRAM) 50 MG tablet, Take 1 tablet (50 mg total) by mouth daily as needed., Disp: 30 tablet, Rfl: 0   atorvastatin (LIPITOR) 80 MG tablet, TAKE 1 TABLET BY MOUTH DAILY AT 6 PM., Disp: 90 tablet, Rfl: 2   Olmesartan-amLODIPine-HCTZ 20-5-12.5 MG TABS, Take 1 tablet by mouth daily., Disp: 90 tablet, Rfl: 2   Allergies  Allergen Reactions   Other Itching and Other (See Comments)    Patient experienced intense itching in her arm that was accessed for a past angiogram     Review of Systems  Constitutional:  Positive for fatigue.  HENT:  Positive for congestion.   Eyes:  Negative for blurred vision.  Respiratory: Negative.  Negative for shortness of breath.   Cardiovascular: Negative.  Negative for palpitations.  Neurological: Negative.   Psychiatric/Behavioral: Negative.       Today's Vitals   02/16/22 1137  BP: 132/78  Pulse: 72  Temp: 98.3 F (36.8 C)  SpO2: 98%  Weight: 209 lb 9.6 oz (95.1 kg)  Height: 5' 1"$  (1.549 m)   Body mass index is 39.6 kg/m.  Wt Readings from Last 3 Encounters:  02/16/22 209 lb 9.6 oz (95.1 kg)  11/23/21 204 lb (92.5 kg)  10/27/21 203 lb 8 oz (92.3 kg)    Objective:  Physical Exam Vitals and nursing note reviewed.  Constitutional:      Appearance: Normal appearance. She is obese.  HENT:     Head: Normocephalic and atraumatic.     Comments: Sounds congeste    Right Ear: Tympanic membrane, ear canal and external ear normal. There is no impacted cerumen.     Left Ear: Tympanic membrane, ear canal and external ear normal. There is no impacted cerumen.     Nose:     Comments: Masked     Mouth/Throat:     Comments: Masked  Eyes:     Extraocular Movements: Extraocular movements intact.  Cardiovascular:      Rate and Rhythm: Normal rate and regular rhythm.     Heart sounds: Murmur heard.  Pulmonary:     Effort: Pulmonary effort is normal.     Breath sounds: Normal breath sounds.  Musculoskeletal:     Cervical back: Normal range of motion.  Skin:    General: Skin is warm.  Neurological:     General: No focal deficit present.     Mental Status: She is alert.  Psychiatric:        Mood and Affect: Mood normal.        Behavior: Behavior normal.         Assessment And Plan:     1. Hypertensive heart disease without heart failure Comments: Chronic, fair control. Encouraged to follow low sodium diet. - Olmesartan-amLODIPine-HCTZ 20-5-12.5 MG TABS; Take 1 tablet by mouth daily.  Dispense: 90 tablet; Refill: 2 - BMP8+EGFR  2. Paroxysmal atrial fibrillation (HCC) Comments: Chronic, currently anticoagulated w/ Eliquis and rate controlled w/ diltiazem.  3. Dyslipidemia associated with type 2 diabetes mellitus ([place]) Comments: I will send rx Mounjaro to improve glycemic control and assist in her weight loss efforts. Denies fam hx thyroid cancer. LDL goal <70. - tirzepatide (MOUNJARO) 5 MG/0.5ML Pen; Inject 5 mg into the skin once a week.  Dispense: 2 mL; Refill: 0 - Hemoglobin A1c  4. Acute cough Comments: Flu a/b negative. She agrees to respiratory panel. - POC Influenza A&B(BINAX/QUICKVUE) - triamcinolone acetonide (KENALOG-40) injection 40 mg - Respiratory Panel w/ SARS-CoV2  5. Acquired thrombophilia ([place]) Comments: She is currently anticoagulated w/ Eliquis due to underlying PAF.  6. Class 2 severe obesity due to excess calories with serious comorbidity and body mass index (BMI) of 39.0 to 39.9 in adult [facility]) Comments: She is encouraged to strive for BMI less than 30 to decrease cardiac risk. Advised to aim for at least 150 minutes of exercise per week.  Patient was given opportunity to ask questions. Patient verbalized understanding of the plan and was able to repeat key elements of  the plan. All questions were answered to their satisfaction.   I, Maximino Greenland, MD, have reviewed all documentation for this visit. The documentation on 02/16/22 for the exam, diagnosis, procedures, and orders are all accurate and complete.   IF YOU HAVE BEEN REFERRED TO A SPECIALIST, IT MAY TAKE 1-2 WEEKS TO SCHEDULE/PROCESS THE REFERRAL. IF YOU HAVE NOT HEARD FROM US/SPECIALIST IN TWO WEEKS, PLEASE GIVE [place] A CALL AT [phone].   THE PATIENT IS ENCOURAGED TO PRACTICE SOCIAL DISTANCING DUE TO THE COVID-19 PANDEMIC.

## 2022-02-17 LAB — HEMOGLOBIN A1C
Est. average glucose Bld gHb Est-mCnc: 137 mg/dL
Hgb A1c MFr Bld: 6.4 % — ABNORMAL HIGH (ref 4.8–5.6)

## 2022-02-17 LAB — BMP8+EGFR
BUN/Creatinine Ratio: 15 (ref 12–28)
BUN: 14 mg/dL (ref 8–27)
CO2: 21 mmol/L (ref 20–29)
Calcium: 10.1 mg/dL (ref 8.7–10.3)
Chloride: 105 mmol/L (ref 96–106)
Creatinine, Ser: 0.92 mg/dL (ref 0.57–1.00)
Glucose: 107 mg/dL — ABNORMAL HIGH (ref 70–99)
Potassium: 4 mmol/L (ref 3.5–5.2)
Sodium: 142 mmol/L (ref 134–144)
eGFR: 71 mL/min/{1.73_m2} (ref >59)

## 2022-02-18 LAB — RESPIRATORY PANEL W/ SARS-COV2

## 2022-02-21 DIAGNOSIS — D6869 Other thrombophilia: Secondary | ICD-10-CM | POA: Insufficient documentation

## 2022-02-23 ENCOUNTER — Other Ambulatory Visit: Payer: Self-pay | Admitting: Internal Medicine

## 2022-03-16 ENCOUNTER — Encounter (HOSPITAL_BASED_OUTPATIENT_CLINIC_OR_DEPARTMENT_OTHER): Payer: Self-pay | Admitting: Cardiovascular Disease

## 2022-03-16 ENCOUNTER — Ambulatory Visit (HOSPITAL_BASED_OUTPATIENT_CLINIC_OR_DEPARTMENT_OTHER): Payer: 59 | Admitting: Cardiovascular Disease

## 2022-03-16 VITALS — BP 114/60 | HR 94 | Ht 61.0 in | Wt 201.0 lb

## 2022-03-16 DIAGNOSIS — I10 Essential (primary) hypertension: Secondary | ICD-10-CM | POA: Diagnosis not present

## 2022-03-16 DIAGNOSIS — I2510 Atherosclerotic heart disease of native coronary artery without angina pectoris: Secondary | ICD-10-CM

## 2022-03-16 DIAGNOSIS — I6521 Occlusion and stenosis of right carotid artery: Secondary | ICD-10-CM

## 2022-03-16 DIAGNOSIS — I480 Paroxysmal atrial fibrillation: Secondary | ICD-10-CM

## 2022-03-16 NOTE — Assessment & Plan Note (Signed)
Chronically occluded right ICA.  Her left ICA is followed by vascular surgery.  Lipids are very well controlled.  Continue ticagrelor and atorvastatin.

## 2022-03-16 NOTE — Assessment & Plan Note (Addendum)
No known recent episodes of atrial fibrillation.  Continue Eliquis and diltiazem.  She has upcoming dental procedure requiring several extractions  Given her history of stroke would recommend bridging with Lovenox.

## 2022-03-16 NOTE — Assessment & Plan Note (Signed)
She is stable and doing well.  She had PCI of her left circumflex in 2020.  She is walking regularly and has no angina.  Continue ticagrelor and atorvastatin.  Ticagrelor can be held as needed for surgical procedures.

## 2022-03-16 NOTE — Assessment & Plan Note (Signed)
Blood pressure is well-controlled on diltiazem, olmesartan/amlodipine/HCTZ.  Continue with regular exercise and weight loss.

## 2022-03-16 NOTE — Progress Notes (Signed)
Cardiology Office Note   Date:  03/16/2022   ID:  LORENZO VANMARTER, DOB 06-22-1960, MRN [id number]  PCP:  Glendale Chard, MD  Cardiologist:  Skeet Latch, MD  Electrophysiologist:  None   Evaluation Performed:  Follow-Up Visit  Chief Complaint:  CAD  History of Present Illness:    RAKYA GIAMBATTISTA is a 62 y.o. female CAD s/p PCI, PAF, hypertension, carotid stenosis, and stroke, who presents today for follow-up.  She was initially seen 06/2016 for an abnormal EKG.  Her sister died of a heart attack at age 61 without any preceding symptoms.  She also has a family history of long QT syndrome. Her mother, aunt, and 2 cousins all have this disease. She also reported intermittent episodes of atypical chest pain and was referred for an ETT 07/22/16 that revealed ST depressions.  She achieved 10.1 METs on the Bruce protocol.  She was referred for left heart catheterization that showed normal coronary arteries.   Ms. Maler was also noted to have a systolic murmur.  She was referred for an echo that revealed LVEF 65-70% and grade 2 diastolic dysfunction.  Pulmonary pressures were mildly elevated (PASP 48 mmHg).  Ms. Hanlan presented to the hospital with chest pain 07/2018.  She underwent LHC which showed 85% mid left circumflex disease with 30 to 40% stenoses in OM 2.  She underwent PCI of the left circumflex.  Left ventriculography revealed LVEF 55 to 65%.  She followed up with Jory Sims, DNP on 07/2018 and had some wrist pain but was otherwise well. Her BP was running low so olmesartan/HCTZ was reduced but not controlled. She switched to taking it twice daily.   She presented to the hospital 07/2019 with a subarachnoid hemorrhage on aspirin and plavix. She had a carotid angiogram that was 80-99% occluded on right and 30% on left. She was referred to vein and vascular follow-up. She had a CT-A of the neck 02/2020 which showed that her right ICA was 100% occluded, and there was 60-70% left ICA  stenosis.  She was seen in the hospital 03/2020 with chest pain and was found to be in atrial fibrillation with RVR. She presents today for pre-operative clearance prior to hip surgery.  She was anticoagulated with Eliquis.  At follow-up 09/2020 she was in sinus rhythm and doing well.  She underwent left hip replacement 05/2021.  Given her stroke history it was recommended that her anticoagulation be bridged.  At the last visit her bp was high in office but cotrolled at home. Aspirin was discontinued. Today, she says she is doing well. She has been continuing her walking, usually on Saturday for about a mile now. She also is going 2-3 times a week for shorter distances. Her breathing has improved a lot now and she is feeling better since starting. The chest tightness no longer occurs when she is walking. She has had some numbness in the LLE since she had her hip replacement. It has been something she has noticed when she is stretching or putting lotion on and it is not hugely concerning. She is having a dental procedure coming up and wanted to clarify if she needs to hold her medications. Recently she started the [facility] and has been losing weight. Her brother recently had a stroke despite healthy lifestyle. She denies any palpitations, chest pain, shortness of breath, or peripheral edema. No lightheadedness, headaches, syncope, orthopnea, or PND.     Past Medical History:  Diagnosis Date   Allergy  Arthritis    back    Atrial fibrillation (HCC)    Atypical chest pain 06/30/2016   Back pain    DUE TO INJURY AT WORK ON05/2013   Bruises easily    Chronic lower back pain    Coronary artery disease    Headache(784.0)    rarely   History of bronchitis    last time about 37yr ago    Hypertension    takes Benicar daily   OSA (obstructive sleep apnea) 04/28/2020   Preoperative evaluation of a medical condition to rule out surgical contraindications (TAR required) 09/12/2020   Pure  hypercholesterolemia 05/31/2019   Stroke ([facility]    no residual   Vitamin D deficiency    takes Vitamin D 2 times/wk   Weakness    and numbness right foot    Past Surgical History:  Procedure Laterality Date   BACK SURGERY     CARPAL TUNNEL RELEASE Right    [facility]  "took [facility] fatty tumor" (11/09/2012)   IR ANGIO INTRA EXTRACRAN SEL COM CAROTID INNOMINATE BILAT MOD SED  08/10/2019   IR ANGIO INTRA EXTRACRAN SEL COM CAROTID INNOMINATE BILAT MOD SED  10/07/2020   IR ANGIO INTRA EXTRACRAN SEL COM CAROTID INNOMINATE UNI L MOD SED  11/05/2020   IR ANGIO VERTEBRAL SEL SUBCLAVIAN INNOMINATE BILAT MOD SED  10/07/2020   IR ANGIO VERTEBRAL SEL VERTEBRAL BILAT MOD SED  08/10/2019   IR CT HEAD LTD  11/05/2020   IR INTRAVSC STENT CERV CAROTID W/EMB-PROT MOD SED INCL ANGIO  11/05/2020   IR RADIOLOGIST EVAL & MGMT  11/21/2020   IR [place] ACC[place] RIGHT  08/10/2019   IR [place] ACCESS RIGHT  10/07/2020   LEFT HEART CATH AND CORONARY ANGIOGRAPHY N/A 08/06/2016   Procedure: Left Heart Cath and Coronary Angiography;  Surgeon: SBelva Crome MD;  [facility];  Service: Cardiovascular;  Laterality: N/A;   LEFT HEART CATH AND CORONARY ANGIOGRAPHY N/A 07/12/2018   Procedure: LEFT HEART CATH AND CORONARY ANGIOGRAPHY;  Surgeon: HLeonie Man MD;  [facility];  Service: Cardiovascular;  Laterality: N/A;   LUMBAR LAMINECTOMY/DECOMPRESSION MICRODISCECTOMY  11/09/2012   "L3-4" (11/09/2012)   LUMBAR LAMINECTOMY/DECOMPRESSION MICRODISCECTOMY N/A 11/09/2012   Procedure: L3-L4 DECOMPRESSION AND MICRODISCECTOMY   (1 LEVEL);  Surgeon: DMelina Schools MD;  [facility]  Service: Orthopedics;  Laterality: N/A;   RADIOLOGY WITH ANESTHESIA N/A 11/05/2020   Procedure: STENTING;  Surgeon: DLuanne Bras MD;  [place] [facility]  Service: Radiology;  Laterality: N/A;   TOTAL HIP ARTHROPLASTY Left 05/11/2021    Procedure: LEFT TOTAL HIP ARTHROPLASTY ANTERIOR APPROACH;  Surgeon: XLeandrew Koyanagi MD;  [place] [facility]  Service: Orthopedics;  Laterality: Left;  3-C     Current Outpatient Medications  Medication Sig Dispense Refill   atorvastatin (LIPITOR) 80 MG tablet TAKE 1 TABLET BY MOUTH DAILY AT 6 PM. 90 tablet 2   benzonatate (TESSALON PERLES) 100 MG capsule Take 1 capsule (100 mg total) by mouth 3 (three) times daily as needed for cough. 30 capsule 1   cholecalciferol (VITAMIN D3) 25 MCG (1000 UNIT) tablet Take 1,000 Units by mouth daily.      diltiazem (CARDIZEM CD) 120 MG 24 hr capsule TAKE 1 CAPSULE BY MOUTH EVERY DAY 30 capsule 5   ELIQUIS 5 MG TABS tablet TAKE 1 TABLET BY MOUTH TWICE A DAY  60 tablet 5   ezetimibe (ZETIA) 10 MG tablet TAKE 1 TABLET BY MOUTH EVERY DAY 90 tablet 2   fluticasone (FLONASE) 50 MCG/ACT nasal spray PLACE 2 SPRAYS IN BOTH NOSTRILS DAILY AS NEEDED FOR ALLERGIES OR RHINITIS 16 mL 8   levocetirizine (XYZAL) 5 MG tablet TAKE 1 TABLET BY MOUTH EVERY DAY 30 tablet 5   Magnesium 250 MG TABS TAKE 1 TABLET BY MOUTH DAILY WITH EVENING MEALS 90 tablet 2   Olmesartan-amLODIPine-HCTZ 20-5-12.5 MG TABS Take 1 tablet by mouth daily. 90 tablet 2   ticagrelor (BRILINTA) 90 MG TABS tablet Take 1 tablet (90 mg total) by mouth 2 (two) times daily. 60 tablet 3   tirzepatide (MOUNJARO) 5 MG/0.5ML Pen Inject 5 mg into the skin once a week. 2 mL 0   topiramate (TOPAMAX) 100 MG tablet Take 1 tablet (100 mg total) by mouth at bedtime. 90 tablet 4   traMADol (ULTRAM) 50 MG tablet Take 1 tablet (50 mg total) by mouth daily as needed. 30 tablet 0   No current facility-administered medications for this visit.    Allergies:   Other    Social History:  The patient  reports that she has never smoked. She has never used smokeless tobacco. She reports that she does not currently use alcohol after a past usage of about 2.0 standard drinks of alcohol per week. She reports that she does not use drugs.    Family History:  The patient's family history includes CAD in her brother and mother; Heart attack in her sister; Heart disease in her mother and sister; Liver disease in her brother; Other in her father; Stroke in her mother.    ROS:  Please see the history of present illness. (+) numbness LLE All other systems are reviewed and negative.    PHYSICAL EXAM: VS:  BP 114/60   Pulse 94   Ht '5\' 1"'$  (1.549 m)   Wt 201 lb (91.2 kg)   LMP 06/03/2011   BMI 37.98 kg/m  , BMI Body mass index is 37.98 kg/m. GENERAL:  Well appearing HEENT: Pupils equal round and reactive, fundi not visualized, oral mucosa unremarkable NECK:  No jugular venous distention, waveform within normal limits,+R carotid bruit LUNGS:  Clear to auscultation bilaterally HEART:  RRR.  PMI not displaced or sustained,S1 and S2 within normal limits, no S3, no S4, no clicks, no rubs, no murmurs ABD:  Flat, positive bowel sounds normal in frequency in pitch, no bruits, no rebound, no guarding, no midline pulsatile mass, no hepatomegaly, no splenomegaly EXT:  2 plus pulses throughout, no edema, no cyanosis no clubbing SKIN:  No rashes no nodules NEURO:  Cranial nerves II through XII grossly intact, motor grossly intact throughout PSYCH:  Cognitively intact, oriented to person place and time   EKG:   09/15/21: Sinus rhythm.  Rate 81 bpm 09/12/2020: Sinus rhythm. Rate 84 bpm. Nonspecific T wave abnormalities. 06/30/16: sinus rhythm. Rate 78 bpm.   Echo 03/17/2020: 1. Left ventricular ejection fraction, by estimation, is 60 to 65%. The  left ventricle has normal function. The left ventricle has no regional  wall motion abnormalities. There is mild concentric left ventricular  hypertrophy. Left ventricular diastolic  parameters are consistent with Grade I diastolic dysfunction (impaired  relaxation).   2. Right ventricular systolic function is normal. The right ventricular  size is mildly enlarged. There is mildly elevated  pulmonary artery  systolic pressure. The estimated right ventricular systolic pressure is  [id number] mmHg.   3.  The mitral valve is grossly normal. Mild mitral valve regurgitation.   4. Tricuspid valve regurgitation is mild to moderate.   5. There is flow acceleration noted in the LVOT without evidence of SAM.  Likely related to LVOT narrowing during systole in the setting of septal  hypertrophy. Flow acceleration occurs prior to the aortic valve with no  evidence of aortic stenosis. DI  0.7.   6. The aortic valve is tricuspid. There is mild calcification of the  aortic valve. There is mild thickening of the aortic valve. Aortic valve  regurgitation is mild. Mild aortic valve sclerosis is present, with no  evidence of aortic valve stenosis.   7. The inferior vena cava is normal in size with greater than 50%  respiratory variability, suggesting right atrial pressure of 3 mmHg.   Comparison(s): Compared to prior TTE in 08/08/19, the flow acceleration  across the LVOT and degree of TR is better captured on the current study.  Otherwise, there is no significant change.   Echo 08/08/2019:  1. Left ventricular ejection fraction, by estimation, is 60 to 65%. The  left ventricle has normal function. The left ventricle has no regional  wall motion abnormalities. Left ventricular diastolic parameters are  consistent with Grade I diastolic  dysfunction (impaired relaxation).   2. Right ventricular systolic function is normal. The right ventricular  size is normal. There is moderately elevated pulmonary artery systolic  pressure.   3. The mitral valve is normal in structure. Trivial mitral valve  regurgitation. No evidence of mitral stenosis.   4. The aortic valve is normal in structure. Aortic valve regurgitation is  mild. No aortic stenosis is present.   5. The inferior vena cava is dilated in size with >50% respiratory  variability, suggesting right atrial pressure of 8 mmHg.   Carotid Arterial  Duplex 06/27/2019: Summary:  Right Carotid: Velocities in the right ICA are consistent with a 80-99%                 stenosis.   Left Carotid: Velocities in the left ICA are consistent with a 40-59%  stenosis.   Vertebrals:  Bilateral vertebral arteries demonstrate antegrade flow.  Subclavians: Left subclavian artery was stenotic. Normal flow hemodynamics were seen in the right subclavian artery.  LHC 07/12/18: CULPRIT LESION: Mid Cx lesion is 85% stenosed with 30-40% stenosed side branch in 2nd Mrg. A drug-eluting stent was successfully placed (crossing OM2) using a STENT SYNERGY DES 3X20 - post-dilated to 3.3 mm Post intervention, there is a 0% residual stenosis. ----------------------------------------- Mid LAD lesion is 30% stenosed. Prox RCA lesion is 25% stenosed. ----------------------------------------- The left ventricular systolic function is normal. The left ventricular ejection fraction is 55-65% by visual estimate. LV end diastolic pressure is normal.   SUMMARY Severe Single Vessel CAD mLCX (@ OM2) - s/p Successful DES PCI (Synergy DES 3.0 x 20 -- 3.3 mm) Otherwise mild disease in LAD & RCA Normal LVEF & normal to low EDP   RECOMMENDATIONS Overnight monitoring post-PCI with TR Band Removal  Continue aggressive Risk Factor Modification  ETT 07/22/16: Blood pressure demonstrated a normal response to exercise. Clinically negative Electrically positive for ischemia with significant ST changes beginning in Stage II. Excellent exercise tolerance  Echo 07/13/16: Study Conclusions   - Left ventricle: The cavity size was normal. Wall thickness was   normal. Systolic function was vigorous. The estimated ejection   fraction was in the range of 65% to 70%. Wall motion was normal;   there  were no regional wall motion abnormalities. Features are   consistent with a pseudonormal left ventricular filling pattern,   with concomitant abnormal relaxation and increased filling    pressure (grade 2 diastolic dysfunction). - Mitral valve: There was mild regurgitation. - Pulmonary arteries: Systolic pressure was moderately increased.   PA peak pressure: 48 mm Hg (S).  Recent Labs: 11/23/2021: ALT 24; Hemoglobin 12.1; Platelets 330 02/16/2022: BUN 14; Creatinine, Ser 0.92; Potassium 4.0; Sodium 142   06/17/16: TSH 2.7 Hemoglobin A1c 5.9 Total cholesterol 219, triglycerides 83, HDL 67, LDL 139  Lipid Panel    Component Value Date/Time   CHOL 126 11/23/2021 1552   TRIG 54 11/23/2021 1552   HDL 68 11/23/2021 1552   CHOLHDL 1.9 11/23/2021 1552   CHOLHDL 2.2 03/17/2020 0115   VLDL 8 03/17/2020 0115   LDLCALC 46 11/23/2021 1552     Wt Readings from Last 3 Encounters:  03/16/22 201 lb (91.2 kg)  02/16/22 209 lb 9.6 oz (95.1 kg)  11/23/21 204 lb (92.5 kg)      ASSESSMENT AND PLAN: Paroxysmal atrial fibrillation (HCC) No known recent episodes of atrial fibrillation.  Continue Eliquis and diltiazem.  She has upcoming dental procedure requiring several extractions  Given her history of stroke would recommend bridging with Lovenox.  ICAO (internal carotid artery occlusion), right Chronically occluded right ICA.  Her left ICA is followed by vascular surgery.  Lipids are very well controlled.  Continue ticagrelor and atorvastatin.  CAD in native artery She is stable and doing well.  She had PCI of her left circumflex in 2020.  She is walking regularly and has no angina.  Continue ticagrelor and atorvastatin.  Ticagrelor can be held as needed for surgical procedures.  Essential hypertension Blood pressure is well-controlled on diltiazem, olmesartan/amlodipine/HCTZ.  Continue with regular exercise and weight loss.   Current medicines are reviewed at length with the patient today.  The patient does not have concerns regarding medicines.  The following changes have been made: none  Labs/ tests ordered today include:   No orders of the defined types were placed in  this encounter.    Disposition:   FU with Jerold Yoss C. Oval Linsey, MD, [facility] in 1 year     Signed, Kida Digiulio C. Oval Linsey, MD, [facility]  03/16/2022 10:11 AM    [place]

## 2022-03-16 NOTE — Patient Instructions (Signed)
Medication Instructions:  Your physician recommends that you continue on your current medications as directed. Please refer to the Current Medication list given to you today.  *If you need a refill on your cardiac medications before your next appointment, please call your pharmacy*  Lab Work: NONE  Testing/Procedures: NONE  Follow-Up: At [facility], you and your health needs are our priority.  As part of our continuing mission to provide you with exceptional heart care, we have created designated Provider Care Teams.  These Care Teams include your primary Cardiologist (physician) and Advanced Practice Providers (APPs -  Physician Assistants and Nurse Practitioners) who all work together to provide you with the care you need, when you need it.  We recommend signing up for the patient portal called "MyChart".  Sign up information is provided on this After Visit Summary.  MyChart is used to connect with patients for Virtual Visits (Telemedicine).  Patients are able to view lab/test results, encounter notes, upcoming appointments, etc.  Non-urgent messages can be sent to your provider as well.   To learn more about what you can do with MyChart, go to [email or web address].    Your next appointment:   12 month(s)  The format for your next appointment:   In Person  Provider:   Skeet Latch, MD

## 2022-03-22 ENCOUNTER — Telehealth: Payer: Self-pay | Admitting: *Deleted

## 2022-03-22 NOTE — Telephone Encounter (Signed)
Pharmacy please advise on holding Eliquis prior to dental extraction of 9 teeth scheduled for TBD. Thank you.

## 2022-03-22 NOTE — Telephone Encounter (Signed)
   Pre-operative Risk Assessment    Patient Name: [name]  DOB: 30-Sep-1960 MRN: [id number]     Request for Surgical Clearance    Procedure:  Dental Extraction - Amount of Teeth to be Pulled:  9  Date of Surgery:  Clearance TBD                                 Surgeon:  DR. Steward [facility] Surgeon's Group or Practice Name:  [facility], [place]  Phone number:  [phone] Fax number:  [phone]   Type of Clearance Requested:   - Pharmacy:  Hold Apixaban (Eliquis) NOT INDICATED HOW LONG   Type of Anesthesia:  Not Indicated   Additional requests/questions:    [name]   03/22/2022, 4:11 PM

## 2022-03-23 NOTE — Telephone Encounter (Signed)
Patient with diagnosis of afib on Eliquis for anticoagulation.    Procedure: 9 dental extractions Date of procedure: TBD  CHA2DS2-VASc Score = 6  This indicates a 9.7% annual risk of stroke. The patient's score is based upon: CHF History: 0 HTN History: 1 Diabetes History: 1 Stroke History: 2 Vascular Disease History: 1 Age Score: 0 Gender Score: 1   CrCl 73m/min using adj body weight due to obesity Platelet count 330K  Patient does not require pre-op antibiotics for dental procedure.  Recommend 1 day Eliquis hold prior to dental extractions. If longer hold is required by dentist, pt will require Lovenox bridging per Dr ROval Linsey  **This guidance is not considered finalized until pre-operative APP has relayed final recommendations.**

## 2022-03-23 NOTE — Telephone Encounter (Signed)
   Patient Name: [name]  DOB: [birth year] MRN: [id number]  Primary Cardiologist: Skeet Latch, MD  Clinical pharmacists have reviewed the patient's past medical history, labs, and current medications as part of preoperative protocol coverage. The following recommendations have been made:  Patient does not require pre-op antibiotics for dental procedure.   Recommend 1 day Eliquis hold prior to dental extractions. If longer hold is required by dentist, pt will require Lovenox bridging per Dr Oval Linsey.    I will route this recommendation to the requesting party via Epic fax function and remove from pre-op pool.  Please call with questions.  [name], [name], NP 03/23/2022, 9:48 AM

## 2022-04-16 ENCOUNTER — Telehealth: Payer: Self-pay | Admitting: Orthopaedic Surgery

## 2022-04-16 ENCOUNTER — Other Ambulatory Visit: Payer: Self-pay | Admitting: Physician Assistant

## 2022-04-16 MED ORDER — TRAMADOL HCL 50 MG PO TABS: 50.0000 mg | ORAL_TABLET | Freq: Every day | ORAL | 0 refills | Status: DC | PRN

## 2022-04-16 NOTE — Telephone Encounter (Signed)
Called and advised pt.

## 2022-04-16 NOTE — Telephone Encounter (Signed)
Patient called asked if she can get something called into her pharmacy for pain? Patient said she was outside doing some work last week and now she is in quite a bit of pain.  The number to contact patient is [phone]

## 2022-04-16 NOTE — Telephone Encounter (Signed)
Sent in tramadol

## 2022-04-20 ENCOUNTER — Other Ambulatory Visit: Payer: Self-pay | Admitting: Student

## 2022-04-20 MED ORDER — TICAGRELOR 90 MG PO TABS: 90.0000 mg | ORAL_TABLET | Freq: Two times a day (BID) | ORAL | 3 refills | Status: DC

## 2022-05-02 NOTE — Progress Notes (Signed)
Office Visit Note   Patient: [name]           Date of Birth: [birth year]           MRN: [id number] Visit Date: 05/04/2022              Requested by: Dorothyann Peng, MD [address] [place],  [place] [address] PCP: Dorothyann Peng, MD   Assessment & Plan: Visit Diagnoses:  1. Status post total replacement of left hip     Plan: Patient is now 1 year postop from a left total hip replacement.  She is doing very well.  Dental prophylaxis reinforced.  Recheck in another year with standing AP pelvis x-rays.  Follow-Up Instructions: Return in about 1 year (around 05/04/2023).   Orders:  Orders Placed This Encounter  Procedures   XR Pelvis 1-2 Views   No orders of the defined types were placed in this encounter.     Procedures: No procedures performed   Clinical Data: No additional findings.   Subjective: Chief Complaint  Patient presents with   Left Hip - Follow-up    Left total hip arthroplasty 05/11/2021    HPI  Plan she is 1 year status post left total hip replacement.  She is doing really well and very happy.  She has no complaints.  Review of Systems   Objective: Vital Signs: LMP 06/03/2011   Physical Exam  Ortho Exam  Examination left hip shows fully healed surgical scar.  Fluid painless range of motion of the hip.  She has a good gait.  Specialty Comments:  No specialty comments available.  Imaging: XR Pelvis 1-2 Views  Result Date: 05/04/2022 Stable left total hip replacement without complications    PMFS History: Patient Active Problem List   Diagnosis Date Noted   Acquired thrombophilia 02/21/2022   Uncontrolled type 2 diabetes mellitus with hyperglycemia 02/16/2022   Nasal congestion 02/16/2022   Hypertensive heart disease without heart failure 02/16/2022   Status post total replacement of left hip 05/11/2021   Abnormal glucose 02/21/2021   Class 2 severe obesity due to excess calories with serious comorbidity and  body mass index (BMI) of 39.0 to 39.9 in adult 02/21/2021   Internal carotid artery stenosis, left 09/23/2020   Preoperative evaluation of a medical condition to rule out surgical contraindications (TAR required) 09/12/2020   Paroxysmal atrial fibrillation 04/28/2020   OSA (obstructive sleep apnea) 04/28/2020   Hypokalemia    Pulmonary hypertension, primary    Elevated troponin    Primary osteoarthritis of right knee 11/20/2019   Cerebrovascular accident (CVA) 10/18/2019   ICAO (internal carotid artery occlusion), right 10/18/2019   Primary osteoarthritis of left hip 09/11/2019   SAH (subarachnoid hemorrhage) 08/07/2019   Acute embolic stroke    Left hip pain 06/25/2019   Severe left groin pain 06/21/2019   Bilateral carotid artery stenosis 06/21/2019   Pure hypercholesterolemia 05/31/2019   CAD in native artery 04/09/2019   Complicated migraine 08/23/2018   Hyperglycemia 07/10/2018   Abnormal stress test 08/06/2016   Essential hypertension 06/30/2016   Past Medical History:  Diagnosis Date   Allergy    Arthritis    back    Atrial fibrillation    Atypical chest pain 06/30/2016   Back pain    DUE TO INJURY AT WORK ON05/2013   Bruises easily    Chronic lower back pain    Coronary artery disease    Headache(784.0)    rarely   History  of bronchitis    last time about 107yrs ago    Hypertension    takes Benicar daily   OSA (obstructive sleep apnea) 04/28/2020   Preoperative evaluation of a medical condition to rule out surgical contraindications (TAR required) 09/12/2020   Pure hypercholesterolemia 05/31/2019   Stroke    no residual   Vitamin D deficiency    takes Vitamin D 2 times/wk   Weakness    and numbness right foot    Family History  Problem Relation Age of Onset   Heart disease Mother    CAD Mother    Stroke Mother    Heart disease Sister    Heart attack Sister    Liver disease Brother    Other Father        unknown medical history   CAD Brother     Colon cancer Neg Hx    Esophageal cancer Neg Hx    Rectal cancer Neg Hx    Stomach cancer Neg Hx     Past Surgical History:  Procedure Laterality Date   BACK SURGERY     CARPAL TUNNEL RELEASE Right    CESAREAN SECTION  1988   COLONOSCOPY     EXPLORATORY LAPAROTOMY  1991   "took out fatty tumor" (11/09/2012)   IR ANGIO INTRA EXTRACRAN SEL COM CAROTID INNOMINATE BILAT MOD SED  08/10/2019   IR ANGIO INTRA EXTRACRAN SEL COM CAROTID INNOMINATE BILAT MOD SED  10/07/2020   IR ANGIO INTRA EXTRACRAN SEL COM CAROTID INNOMINATE UNI L MOD SED  11/05/2020   IR ANGIO VERTEBRAL SEL SUBCLAVIAN INNOMINATE BILAT MOD SED  10/07/2020   IR ANGIO VERTEBRAL SEL VERTEBRAL BILAT MOD SED  08/10/2019   IR CT HEAD LTD  11/05/2020   IR INTRAVSC STENT CERV CAROTID W/EMB-PROT MOD SED INCL ANGIO  11/05/2020   IR RADIOLOGIST EVAL & MGMT  11/21/2020   IR US GUIDE VASC ACCESS RIGHT  08/10/2019   IR US GUIDE VASC ACCESS RIGHT  10/07/2020   LEFT HEART CATH AND CORONARY ANGIOGRAPHY N/A 08/06/2016   Procedure: Left Heart Cath and Coronary Angiography;  Surgeon: Lyn Records, MD;  Location: [facility] INVASIVE CV LAB;  Service: Cardiovascular;  Laterality: N/A;   LEFT HEART CATH AND CORONARY ANGIOGRAPHY N/A 07/12/2018   Procedure: LEFT HEART CATH AND CORONARY ANGIOGRAPHY;  Surgeon: Marykay Lex, MD;  Location: [facility] INVASIVE CV LAB;  Service: Cardiovascular;  Laterality: N/A;   LUMBAR LAMINECTOMY/DECOMPRESSION MICRODISCECTOMY  11/09/2012   "L3-4" (11/09/2012)   LUMBAR LAMINECTOMY/DECOMPRESSION MICRODISCECTOMY N/A 11/09/2012   Procedure: L3-L4 DECOMPRESSION AND MICRODISCECTOMY   (1 LEVEL);  Surgeon: Venita Lick, MD;  Location: [facility] OR;  Service: Orthopedics;  Laterality: N/A;   RADIOLOGY WITH ANESTHESIA N/A 11/05/2020   Procedure: STENTING;  Surgeon: Julieanne Cotton, MD;  Location: [facility] OR;  Service: Radiology;  Laterality: N/A;   TOTAL HIP ARTHROPLASTY Left 05/11/2021   Procedure: LEFT TOTAL HIP ARTHROPLASTY ANTERIOR APPROACH;  Surgeon:  Tarry Kos, MD;  Location: [facility] OR;  Service: Orthopedics;  Laterality: Left;  3-C   Social History   Occupational History   Occupation: Control and instrumentation engineer  Tobacco Use   Smoking status: Never   Smokeless tobacco: Never  Vaping Use   Vaping Use: Never used  Substance and Sexual Activity   Alcohol use: Not Currently    Alcohol/week: 2.0 standard drinks of alcohol    Types: 2 Glasses of wine per week    Comment: social- 2 wine or 2 beers   Drug  use: Never   Sexual activity: Yes    Birth control/protection: Post-menopausal

## 2022-05-04 ENCOUNTER — Ambulatory Visit: Payer: 59 | Admitting: Orthopaedic Surgery

## 2022-05-04 ENCOUNTER — Other Ambulatory Visit (INDEPENDENT_AMBULATORY_CARE_PROVIDER_SITE_OTHER): Payer: 59

## 2022-05-04 ENCOUNTER — Encounter: Payer: Self-pay | Admitting: Orthopaedic Surgery

## 2022-05-04 DIAGNOSIS — Z96642 Presence of left artificial hip joint: Secondary | ICD-10-CM | POA: Diagnosis not present

## 2022-05-10 ENCOUNTER — Other Ambulatory Visit: Payer: Self-pay | Admitting: Internal Medicine

## 2022-05-10 DIAGNOSIS — E1169 Type 2 diabetes mellitus with other specified complication: Secondary | ICD-10-CM

## 2022-05-12 ENCOUNTER — Other Ambulatory Visit: Payer: Self-pay | Admitting: Cardiovascular Disease

## 2022-05-12 DIAGNOSIS — I480 Paroxysmal atrial fibrillation: Secondary | ICD-10-CM

## 2022-05-12 NOTE — Telephone Encounter (Signed)
Please review for refill. Thank you! 

## 2022-05-12 NOTE — Telephone Encounter (Signed)
Eliquis 5mg  refill request received. Patient is 62 years old, weight-91.2kg, Crea-0.92 on 02/16/22, Diagnosis-Afib, and last seen by Dr. Duke Salvia on 03/16/22. Dose is appropriate based on dosing criteria. Will send in refill to requested pharmacy.

## 2022-05-17 ENCOUNTER — Other Ambulatory Visit: Payer: Self-pay | Admitting: Internal Medicine

## 2022-05-20 ENCOUNTER — Other Ambulatory Visit: Payer: Self-pay | Admitting: Cardiovascular Disease

## 2022-05-20 ENCOUNTER — Other Ambulatory Visit: Payer: Self-pay | Admitting: Internal Medicine

## 2022-05-21 ENCOUNTER — Other Ambulatory Visit: Payer: Self-pay

## 2022-05-21 MED ORDER — LEVOCETIRIZINE DIHYDROCHLORIDE 5 MG PO TABS: 5.0000 mg | ORAL_TABLET | Freq: Every day | ORAL | 2 refills | Status: DC

## 2022-05-24 ENCOUNTER — Encounter: Payer: Self-pay | Admitting: Internal Medicine

## 2022-05-24 ENCOUNTER — Ambulatory Visit (INDEPENDENT_AMBULATORY_CARE_PROVIDER_SITE_OTHER): Payer: 59 | Admitting: Internal Medicine

## 2022-05-24 VITALS — BP 122/78 | HR 91 | Temp 98.5°F | Ht 61.0 in | Wt 198.6 lb

## 2022-05-24 DIAGNOSIS — E1169 Type 2 diabetes mellitus with other specified complication: Secondary | ICD-10-CM

## 2022-05-24 DIAGNOSIS — E785 Hyperlipidemia, unspecified: Secondary | ICD-10-CM

## 2022-05-24 DIAGNOSIS — Z6837 Body mass index (BMI) 37.0-37.9, adult: Secondary | ICD-10-CM

## 2022-05-24 DIAGNOSIS — I119 Hypertensive heart disease without heart failure: Secondary | ICD-10-CM | POA: Diagnosis not present

## 2022-05-24 DIAGNOSIS — I480 Paroxysmal atrial fibrillation: Secondary | ICD-10-CM

## 2022-05-24 DIAGNOSIS — D6869 Other thrombophilia: Secondary | ICD-10-CM

## 2022-05-24 MED ORDER — LEVOCETIRIZINE DIHYDROCHLORIDE 5 MG PO TABS: 5.0000 mg | ORAL_TABLET | Freq: Every day | ORAL | 2 refills | Status: DC

## 2022-05-24 MED ORDER — DILTIAZEM HCL ER COATED BEADS 120 MG PO CP24: ORAL_CAPSULE | ORAL | 2 refills | Status: DC

## 2022-05-24 NOTE — Progress Notes (Signed)
I,Victoria T Hamilton,acting as a scribe for [name], MD.,have documented all relevant documentation on the behalf of [name], MD,as directed by  [name], MD while in the presence of [name], MD.    Subjective:     Patient ID: [name] , female    DOB: 1960-05-05 , 61 y.o.   MRN: [id number]   Chief Complaint  Patient presents with   Hypertension   Diabetes    HPI  Patient presents today for a blood pressure f/u. She reports compliance with her meds. She denies headaches, chest pain and shortness of breath.   She reports being out of the [facility] for a month. She admits being frustrated since no pharmacy has the medication in stock.   Patient notified, she needs to schedule DM eye exam.   Hypertension This is a chronic problem. The current episode started more than 1 year ago. The problem has been gradually improving since onset. The problem is controlled. Pertinent negatives include no blurred vision, palpitations or shortness of breath. The current treatment provides moderate improvement. Compliance problems include exercise.   Diabetes She presents for her follow-up diabetic visit. She has type 2 diabetes mellitus. There are no hypoglycemic associated symptoms. Pertinent negatives for diabetes include no blurred vision. There are no hypoglycemic complications. Risk factors for coronary artery disease include diabetes mellitus, dyslipidemia, hypertension, obesity, post-menopausal and sedentary lifestyle. Meal planning includes avoidance of concentrated sweets. She has not had a previous visit with a dietitian. She participates in exercise intermittently. An ACE inhibitor/angiotensin II receptor blocker is being taken.     Past Medical History:  Diagnosis Date   Allergy    Arthritis    back    Atrial fibrillation (HCC)    Atypical chest pain 06/30/2016   Back pain    DUE TO INJURY AT WORK ON05/2013   Bruises easily    Chronic lower back  pain    Coronary artery disease    Headache(784.0)    rarely   History of bronchitis    last time about 29yrs ago    Hypertension    takes Benicar daily   OSA (obstructive sleep apnea) 04/28/2020   Preoperative evaluation of a medical condition to rule out surgical contraindications (TAR required) 09/12/2020   Pure hypercholesterolemia 05/31/2019   Stroke (HCC)    no residual   Vitamin D deficiency    takes Vitamin D 2 times/wk   Weakness    and numbness right foot     Family History  Problem Relation Age of Onset   Heart disease Mother    CAD Mother    Stroke Mother    Heart disease Sister    Heart attack Sister    Liver disease Brother    Other Father        unknown medical history   CAD Brother    Colon cancer Neg Hx    Esophageal cancer Neg Hx    Rectal cancer Neg Hx    Stomach cancer Neg Hx      Current Outpatient Medications:    atorvastatin (LIPITOR) 80 MG tablet, TAKE 1 TABLET BY MOUTH DAILY AT 6 PM., Disp: 90 tablet, Rfl: 2   cholecalciferol (VITAMIN D3) 25 MCG (1000 UNIT) tablet, Take 1,000 Units by mouth daily. , Disp: , Rfl:    ELIQUIS 5 MG TABS tablet, TAKE 1 TABLET BY MOUTH TWICE A DAY, Disp: 60 tablet, Rfl: 5   ezetimibe (ZETIA) 10 MG  tablet, TAKE 1 TABLET BY MOUTH EVERY DAY, Disp: 90 tablet, Rfl: 2   fluticasone (FLONASE) 50 MCG/ACT nasal spray, PLACE 2 SPRAYS IN BOTH NOSTRILS DAILY AS NEEDED FOR ALLERGIES OR RHINITIS, Disp: 16 mL, Rfl: 8   Magnesium 250 MG TABS, TAKE 1 TABLET BY MOUTH DAILY WITH EVENING MEALS, Disp: 90 tablet, Rfl: 2   Olmesartan-amLODIPine-HCTZ 20-5-12.5 MG TABS, Take 1 tablet by mouth daily., Disp: 90 tablet, Rfl: 2   ticagrelor (BRILINTA) 90 MG TABS tablet, Take 1 tablet (90 mg total) by mouth 2 (two) times daily., Disp: 60 tablet, Rfl: 3   topiramate (TOPAMAX) 100 MG tablet, Take 1 tablet (100 mg total) by mouth at bedtime., Disp: 90 tablet, Rfl: 4   traMADol (ULTRAM) 50 MG tablet, Take 1 tablet (50 mg total) by mouth daily as  needed., Disp: 30 tablet, Rfl: 0   benzonatate (TESSALON PERLES) 100 MG capsule, Take 1 capsule (100 mg total) by mouth 3 (three) times daily as needed for cough. (Patient not taking: Reported on 05/24/2022), Disp: 30 capsule, Rfl: 1   diltiazem (CARDIZEM CD) 120 MG 24 hr capsule, TAKE 1 CAPSULE BY MOUTH EVERY DAY, Disp: 90 capsule, Rfl: 2   levocetirizine (XYZAL) 5 MG tablet, Take 1 tablet (5 mg total) by mouth daily., Disp: 90 tablet, Rfl: 2   tirzepatide (MOUNJARO) 5 MG/0.5ML Pen, INJECT 5 MG SUBCUTANEOUSLY WEEKLY (Patient not taking: Reported on 05/24/2022), Disp: 2 mL, Rfl: 1   Allergies  Allergen Reactions   Other Itching and Other (See Comments)    Patient experienced intense itching in her arm that was accessed for a past angiogram     Review of Systems  Constitutional: Negative.   Eyes:  Negative for blurred vision.  Respiratory: Negative.  Negative for shortness of breath.   Cardiovascular: Negative.  Negative for palpitations.  Gastrointestinal: Negative.   Musculoskeletal: Negative.   Skin: Negative.   Neurological: Negative.   Psychiatric/Behavioral: Negative.       Today's Vitals   05/24/22 1554  BP: 122/78  Pulse: 91  Temp: 98.5 F (36.9 C)  SpO2: 98%  Weight: 198 lb 9.6 oz (90.1 kg)  Height: 5\' 1"  (1.549 m)   Wt Readings from Last 3 Encounters:  05/24/22 198 lb 9.6 oz (90.1 kg)  03/16/22 201 lb (91.2 kg)  02/16/22 209 lb 9.6 oz (95.1 kg)    Body mass index is 37.53 kg/m.  The ASCVD Risk score (Arnett DK, et al., 2019) failed to calculate for the following reasons:   The patient has a prior MI or stroke diagnosis ++ Objective:  Physical Exam Vitals and nursing note reviewed.  Constitutional:      Appearance: Normal appearance. She is obese.  HENT:     Head: Normocephalic and atraumatic.  Eyes:     Extraocular Movements: Extraocular movements intact.  Cardiovascular:     Rate and Rhythm: Normal rate and regular rhythm.     Heart sounds: Normal heart  sounds.  Pulmonary:     Effort: Pulmonary effort is normal.     Breath sounds: Normal breath sounds.  Musculoskeletal:     Cervical back: Normal range of motion.  Skin:    General: Skin is warm.  Neurological:     General: No focal deficit present.     Mental Status: She is alert.  Psychiatric:        Mood and Affect: Mood normal.        Behavior: Behavior normal.      Assessment  And Plan:     1. Hypertensive heart disease without heart failure Comments: Chronic, well controlled. She will c/w Tribenzor 20/5/12.5mg  daily. I will check renal function today.  2. Paroxysmal atrial fibrillation (HCC) Comments: Chronic, she is rate controlled and properly anticoagulated. - TSH  3. Dyslipidemia associated with type 2 diabetes mellitus (HCC) Comments: Chronic, unfortunately she has been unable to find 5mg  Mounjaro. Once she is able to fill this, I plan to increase to 7.5mg  weekly. F/u 3-4 months. - Hemoglobin A1c - TSH - CMP14+EGFR  4. Acquired thrombophilia (HCC) Comments: She is currently on Eliquis due to underlying PAF.  5. Class 2 severe obesity due to excess calories with serious comorbidity and body mass index (BMI) of 37.0 to 37.9 in adult [facility]) Comments: She was congratulated on her 11 lbs. weight loss thus far.  She is encouraged to keep up the great work.   Return for  sch friday nurse visit for 1st shingles shot, 4 month DM.   Patient was given opportunity to ask questions. Patient verbalized understanding of the plan and was able to repeat key elements of the plan. All questions were answered to their satisfaction.   I, [name], MD, have reviewed all documentation for this visit. The documentation on 05/24/22 for the exam, diagnosis, procedures, and orders are all accurate and complete.   IF YOU HAVE BEEN REFERRED TO A SPECIALIST, IT MAY TAKE 1-2 WEEKS TO SCHEDULE/PROCESS THE REFERRAL. IF YOU HAVE NOT HEARD FROM US/SPECIALIST IN TWO WEEKS, PLEASE GIVE [place] A  CALL AT [phone] X [phone].   THE PATIENT IS ENCOURAGED TO PRACTICE SOCIAL DISTANCING DUE TO THE COVID-19 PANDEMIC.

## 2022-05-24 NOTE — Patient Instructions (Signed)
Hypertension, Adult ?Hypertension is another name for high blood pressure. High blood pressure forces your heart to work harder to pump blood. This can cause problems over time. ?There are two numbers in a blood pressure reading. There is a top number (systolic) over a bottom number (diastolic). It is best to have a blood pressure that is below 120/80. ?What are the causes? ?The cause of this condition is not known. Some other conditions can lead to high blood pressure. ?What increases the risk? ?Some lifestyle factors can make you more likely to develop high blood pressure: ?Smoking. ?Not getting enough exercise or physical activity. ?Being overweight. ?Having too much fat, sugar, calories, or salt (sodium) in your diet. ?Drinking too much alcohol. ?Other risk factors include: ?Having any of these conditions: ?Heart disease. ?Diabetes. ?High cholesterol. ?Kidney disease. ?Obstructive sleep apnea. ?Having a family history of high blood pressure and high cholesterol. ?Age. The risk increases with age. ?Stress. ?What are the signs or symptoms? ?High blood pressure may not cause symptoms. Very high blood pressure (hypertensive crisis) may cause: ?Headache. ?Fast or uneven heartbeats (palpitations). ?Shortness of breath. ?Nosebleed. ?Vomiting or feeling like you may vomit (nauseous). ?Changes in how you see. ?Very bad chest pain. ?Feeling dizzy. ?Seizures. ?How is this treated? ?This condition is treated by making healthy lifestyle changes, such as: ?Eating healthy foods. ?Exercising more. ?Drinking less alcohol. ?Your doctor may prescribe medicine if lifestyle changes do not help enough and if: ?Your top number is above 130. ?Your bottom number is above 80. ?Your personal target blood pressure may vary. ?Follow these instructions at home: ?Eating and drinking ? ?If told, follow the DASH eating plan. To follow this plan: ?Fill one half of your plate at each meal with fruits and vegetables. ?Fill one fourth of your plate  at each meal with whole grains. Whole grains include whole-wheat pasta, brown rice, and whole-grain bread. ?Eat or drink low-fat dairy products, such as skim milk or low-fat yogurt. ?Fill one fourth of your plate at each meal with low-fat (lean) proteins. Low-fat proteins include fish, chicken without skin, eggs, beans, and tofu. ?Avoid fatty meat, cured and processed meat, or chicken with skin. ?Avoid pre-made or processed food. ?Limit the amount of salt in your diet to less than 1,500 mg each day. ?Do not drink alcohol if: ?Your doctor tells you not to drink. ?You are pregnant, may be pregnant, or are planning to become pregnant. ?If you drink alcohol: ?Limit how much you have to: ?0-1 drink a day for women. ?0-2 drinks a day for men. ?Know how much alcohol is in your drink. In the [place] one drink equals one 12 oz bottle of beer (355 mL), one 5 oz glass of wine (148 mL), or one 1? oz glass of hard liquor (44 mL). ?Lifestyle ? ?Work with your doctor to stay at a healthy weight or to lose weight. Ask your doctor what the best weight is for you. ?Get at least 30 minutes of exercise that causes your heart to beat faster (aerobic exercise) most days of the week. This may include walking, swimming, or biking. ?Get at least 30 minutes of exercise that strengthens your muscles (resistance exercise) at least 3 days a week. This may include lifting weights or doing Pilates. ?Do not smoke or use any products that contain nicotine or tobacco. If you need help quitting, ask your doctor. ?Check your blood pressure at home as told by your doctor. ?Keep all follow-up visits. ?Medicines ?Take over-the-counter and prescription medicines   only as told by your doctor. Follow directions carefully. ?Do not skip doses of blood pressure medicine. The medicine does not work as well if you skip doses. Skipping doses also puts you at risk for problems. ?Ask your doctor about side effects or reactions to medicines that you should watch  for. ?Contact a doctor if: ?You think you are having a reaction to the medicine you are taking. ?You have headaches that keep coming back. ?You feel dizzy. ?You have swelling in your ankles. ?You have trouble with your vision. ?Get help right away if: ?You get a very bad headache. ?You start to feel mixed up (confused). ?You feel weak or numb. ?You feel faint. ?You have very bad pain in your: ?Chest. ?Belly (abdomen). ?You vomit more than once. ?You have trouble breathing. ?These symptoms may be an emergency. Get help right away. Call 911. ?Do not wait to see if the symptoms will go away. ?Do not drive yourself to the hospital. ?Summary ?Hypertension is another name for high blood pressure. ?High blood pressure forces your heart to work harder to pump blood. ?For most people, a normal blood pressure is less than 120/80. ?Making healthy choices can help lower blood pressure. If your blood pressure does not get lower with healthy choices, you may need to take medicine. ?This information is not intended to replace advice given to you by your health care provider. Make sure you discuss any questions you have with your health care provider. ?Document Revised: 10/16/2020 Document Reviewed: 10/16/2020 ?Elsevier Patient Education ? 2023 [employer] ? ?

## 2022-05-25 LAB — TSH: TSH: 1.44 u[IU]/mL (ref 0.450–4.500)

## 2022-05-25 LAB — CMP14+EGFR
ALT: 19 IU/L (ref 0–32)
AST: 19 IU/L (ref 0–40)
Albumin/Globulin Ratio: 1.4 (ref 1.2–2.2)
Albumin: 4.6 g/dL (ref 3.9–4.9)
Alkaline Phosphatase: 81 IU/L (ref 44–121)
BUN/Creatinine Ratio: 18 (ref 12–28)
BUN: 19 mg/dL (ref 8–27)
Bilirubin Total: 0.5 mg/dL (ref 0.0–1.2)
CO2: 20 mmol/L (ref 20–29)
Calcium: 9.7 mg/dL (ref 8.7–10.3)
Chloride: 102 mmol/L (ref 96–106)
Creatinine, Ser: 1.04 mg/dL — ABNORMAL HIGH (ref 0.57–1.00)
Globulin, Total: 3.2 g/dL (ref 1.5–4.5)
Glucose: 102 mg/dL — ABNORMAL HIGH (ref 70–99)
Potassium: 3.8 mmol/L (ref 3.5–5.2)
Sodium: 139 mmol/L (ref 134–144)
Total Protein: 7.8 g/dL (ref 6.0–8.5)
eGFR: 61 mL/min/{1.73_m2} (ref >59)

## 2022-05-25 LAB — HEMOGLOBIN A1C
Est. average glucose Bld gHb Est-mCnc: 126 mg/dL
Hgb A1c MFr Bld: 6 % — ABNORMAL HIGH (ref 4.8–5.6)

## 2022-05-27 ENCOUNTER — Encounter: Payer: Self-pay | Admitting: Gastroenterology

## 2022-06-28 ENCOUNTER — Other Ambulatory Visit: Payer: Self-pay | Admitting: Internal Medicine

## 2022-08-04 ENCOUNTER — Other Ambulatory Visit: Payer: Self-pay | Admitting: Internal Medicine

## 2022-08-04 DIAGNOSIS — E1169 Type 2 diabetes mellitus with other specified complication: Secondary | ICD-10-CM

## 2022-08-14 ENCOUNTER — Other Ambulatory Visit: Payer: Self-pay | Admitting: Cardiovascular Disease

## 2022-08-26 ENCOUNTER — Other Ambulatory Visit (HOSPITAL_COMMUNITY): Payer: Self-pay | Admitting: Interventional Radiology

## 2022-08-26 DIAGNOSIS — I771 Stricture of artery: Secondary | ICD-10-CM

## 2022-09-08 ENCOUNTER — Encounter: Payer: Self-pay | Admitting: Family Medicine

## 2022-09-08 ENCOUNTER — Ambulatory Visit (HOSPITAL_COMMUNITY)
Admission: RE | Admit: 2022-09-08 | Discharge: 2022-09-08 | Disposition: A | Discharge status: Home / Self Care | Payer: 59 | Source: Ambulatory Visit | Attending: Interventional Radiology | Admitting: Interventional Radiology

## 2022-09-08 ENCOUNTER — Ambulatory Visit: Payer: 59 | Admitting: Family Medicine

## 2022-09-08 VITALS — BP 130/78 | HR 82 | Temp 98.1°F | Ht 61.0 in | Wt 201.0 lb

## 2022-09-08 DIAGNOSIS — J3489 Other specified disorders of nose and nasal sinuses: Secondary | ICD-10-CM

## 2022-09-08 DIAGNOSIS — D6869 Other thrombophilia: Secondary | ICD-10-CM

## 2022-09-08 DIAGNOSIS — I771 Stricture of artery: Secondary | ICD-10-CM

## 2022-09-08 DIAGNOSIS — R051 Acute cough: Secondary | ICD-10-CM | POA: Diagnosis not present

## 2022-09-08 MED ORDER — BENZONATATE 100 MG PO CAPS
100.0000 mg | ORAL_CAPSULE | Freq: Three times a day (TID) | ORAL | 1 refills | Status: DC | PRN
Start: 2022-09-08 — End: 2023-07-27

## 2022-09-08 MED ORDER — DOXYCYCLINE HYCLATE 100 MG PO CAPS
100.0000 mg | ORAL_CAPSULE | Freq: Two times a day (BID) | ORAL | 0 refills | Status: AC
Start: 2022-09-08 — End: 2022-09-15

## 2022-09-08 NOTE — Assessment & Plan Note (Signed)
On Eliquis 5mg  BID for PAF

## 2022-09-08 NOTE — Progress Notes (Signed)
I,Jameka J Llittleton, CMA,acting as a Neurosurgeon for [employer], NP.,have documented all relevant documentation on the behalf of Ellender Hose, NP,as directed by  Ellender Hose, NP while in the presence of Ellender Hose, NP.  Subjective:  Patient ID: [name] , female    DOB: 29-Mar-1960 , 62 y.o.   MRN: [id number]  Chief Complaint  Patient presents with   URI    HPI  Patient presents today for a sinus infection. She stated her symptoms started last Thursday. She reported she did not try anything OTC due to the medications she is on already. Her symptoms are nasal congestion, headaches, sinus pressure etc, denies any fever.     Past Medical History:  Diagnosis Date   Allergy    Arthritis    back    Atrial fibrillation (HCC)    Atypical chest pain 06/30/2016   Back pain    DUE TO INJURY AT WORK ON05/2013   Bruises easily    Chronic lower back pain    Coronary artery disease    Headache(784.0)    rarely   History of bronchitis    last time about 36yrs ago    Hypertension    takes Benicar daily   OSA (obstructive sleep apnea) 04/28/2020   Preoperative evaluation of a medical condition to rule out surgical contraindications (TAR required) 09/12/2020   Pure hypercholesterolemia 05/31/2019   Stroke (HCC)    no residual   Vitamin D deficiency    takes Vitamin D 2 times/wk   Weakness    and numbness right foot     Family History  Problem Relation Age of Onset   Heart disease Mother    CAD Mother    Stroke Mother    Heart disease Sister    Heart attack Sister    Liver disease Brother    Other Father        unknown medical history   CAD Brother    Colon cancer Neg Hx    Esophageal cancer Neg Hx    Rectal cancer Neg Hx    Stomach cancer Neg Hx      Current Outpatient Medications:    atorvastatin (LIPITOR) 80 MG tablet, TAKE 1 TABLET BY MOUTH DAILY AT 6 PM., Disp: 90 tablet, Rfl: 2   benzonatate (TESSALON PERLES) 100 MG capsule, Take 1 capsule (100 mg total) by mouth  3 (three) times daily as needed for cough., Disp: 30 capsule, Rfl: 1   cholecalciferol (VITAMIN D3) 25 MCG (1000 UNIT) tablet, Take 1,000 Units by mouth daily. , Disp: , Rfl:    diltiazem (CARDIZEM CD) 120 MG 24 hr capsule, TAKE 1 CAPSULE BY MOUTH EVERY DAY, Disp: 90 capsule, Rfl: 2   doxycycline (VIBRAMYCIN) 100 MG capsule, Take 1 capsule (100 mg total) by mouth 2 (two) times daily for 7 days., Disp: 14 capsule, Rfl: 0   ELIQUIS 5 MG TABS tablet, TAKE 1 TABLET BY MOUTH TWICE A DAY, Disp: 60 tablet, Rfl: 5   ezetimibe (ZETIA) 10 MG tablet, TAKE 1 TABLET BY MOUTH EVERY DAY, Disp: 30 tablet, Rfl: 8   fluticasone (FLONASE) 50 MCG/ACT nasal spray, PLACE 2 SPRAYS IN BOTH NOSTRILS DAILY AS NEEDED FOR ALLERGIES OR RHINITIS, Disp: 16 mL, Rfl: 8   levocetirizine (XYZAL) 5 MG tablet, Take 1 tablet (5 mg total) by mouth daily., Disp: 90 tablet, Rfl: 2   Magnesium 250 MG TABS, TAKE 1 TABLET BY MOUTH DAILY WITH EVENING MEALS, Disp: 90 tablet, Rfl: 2  Olmesartan-amLODIPine-HCTZ 20-5-12.5 MG TABS, Take 1 tablet by mouth daily., Disp: 90 tablet, Rfl: 2   ticagrelor (BRILINTA) 90 MG TABS tablet, Take 1 tablet (90 mg total) by mouth 2 (two) times daily., Disp: 60 tablet, Rfl: 3   tirzepatide (MOUNJARO) 5 MG/0.5ML Pen, INJECT 5 MG SUBCUTANEOUSLY WEEKLY, Disp: 2 mL, Rfl: 1   topiramate (TOPAMAX) 100 MG tablet, Take 1 tablet (100 mg total) by mouth at bedtime., Disp: 90 tablet, Rfl: 4   traMADol (ULTRAM) 50 MG tablet, Take 1 tablet (50 mg total) by mouth daily as needed., Disp: 30 tablet, Rfl: 0   Allergies  Allergen Reactions   Other Itching and Other (See Comments)    Patient experienced intense itching in her arm that was accessed for a past angiogram     Review of Systems  HENT:  Positive for congestion, sinus pressure and sinus pain.   Respiratory: Negative.    Cardiovascular: Negative.   Gastrointestinal: Negative.   Neurological: Negative.   Psychiatric/Behavioral: Negative.       Today's Vitals    09/08/22 1436  BP: 130/78  Pulse: 82  Temp: 98.1 F (36.7 C)  Weight: 201 lb (91.2 kg)  Height: 5\' 1"  (1.549 m)  PainSc: 4   PainLoc: Head   Body mass index is 37.98 kg/m.  Wt Readings from Last 3 Encounters:  09/08/22 201 lb (91.2 kg)  05/24/22 198 lb 9.6 oz (90.1 kg)  03/16/22 201 lb (91.2 kg)      Objective:  Physical Exam HENT:     Nose: Congestion present.     Right Sinus: Frontal sinus tenderness present.     Left Sinus: Frontal sinus tenderness present.  Cardiovascular:     Rate and Rhythm: Normal rate.  Pulmonary:     Effort: Pulmonary effort is normal.     Breath sounds: Normal breath sounds.  Skin:    General: Skin is warm and dry.  Neurological:     General: No focal deficit present.     Mental Status: She is alert and oriented to person, place, and time.  Psychiatric:        Behavior: Behavior normal.         Assessment And Plan:  Sinus pain Assessment & Plan: Advised to use the Neti pot in rinsing nares at night to clear air passages  Orders: -     Doxycycline Hyclate; Take 1 capsule (100 mg total) by mouth 2 (two) times daily for 7 days.  Dispense: 14 capsule; Refill: 0  Acquired thrombophilia (HCC) Assessment & Plan: On Eliquis 5mg  BID for PAF   Acute cough -     Benzonatate; Take 1 capsule (100 mg total) by mouth 3 (three) times daily as needed for cough.  Dispense: 30 capsule; Refill: 1    Return if symptoms worsen or fail to improve.  Patient was given opportunity to ask questions. Patient verbalized understanding of the plan and was able to repeat key elements of the plan. All questions were answered to their satisfaction.    I, Ellender Hose, NP, have reviewed all documentation for this visit. The documentation on 09/14/22 for the exam, diagnosis, procedures, and orders are all accurate and complete.   IF YOU HAVE BEEN REFERRED TO A SPECIALIST, IT MAY TAKE 1-2 WEEKS TO SCHEDULE/PROCESS THE REFERRAL. IF YOU HAVE NOT HEARD FROM  US/SPECIALIST IN TWO WEEKS, PLEASE GIVE [place] A CALL AT [phone].

## 2022-09-14 ENCOUNTER — Telehealth (HOSPITAL_COMMUNITY): Payer: Self-pay

## 2022-09-14 DIAGNOSIS — J3489 Other specified disorders of nose and nasal sinuses: Secondary | ICD-10-CM | POA: Insufficient documentation

## 2022-09-14 DIAGNOSIS — R051 Acute cough: Secondary | ICD-10-CM | POA: Insufficient documentation

## 2022-09-14 NOTE — Assessment & Plan Note (Signed)
Advised to use the Neti pot in rinsing nares at night to clear air passages

## 2022-09-14 NOTE — Telephone Encounter (Signed)
 Pt agreed to f/u in 1 year with a cta head/neck. AB

## 2022-09-15 ENCOUNTER — Telehealth (HOSPITAL_COMMUNITY): Payer: Self-pay

## 2022-09-15 NOTE — Telephone Encounter (Signed)
-----   Message from [place] Her[place] sent at 09/10/2022  2:46 PM EDT ----- Regarding: Carotid US follow up Hi friends.  Dr. Corliss Skains has reviewed this patient's carotid US and he would like her to have a CTA head/neck in 1 year to further assess the US findings of recanalization of the previously occluded right ICA.   Thanks, [name]

## 2022-09-23 ENCOUNTER — Ambulatory Visit: Payer: 59 | Admitting: Orthopaedic Surgery

## 2022-09-23 ENCOUNTER — Encounter: Payer: Self-pay | Admitting: Orthopaedic Surgery

## 2022-09-23 ENCOUNTER — Other Ambulatory Visit (INDEPENDENT_AMBULATORY_CARE_PROVIDER_SITE_OTHER): Payer: 59

## 2022-09-23 DIAGNOSIS — M25561 Pain in right knee: Secondary | ICD-10-CM

## 2022-09-23 NOTE — Progress Notes (Signed)
Office Visit Note   Patient: [name]           Date of Birth: Apr 15, 1960           MRN: [id number] Visit Date: 09/23/2022              Requested by: Dorothyann Peng, MD [address] [place],  [place] [address] PCP: Dorothyann Peng, MD   Assessment & Plan: Visit Diagnoses:  1. Acute pain of right knee     Plan: Impression is acute right calf strain.  I think the risk is low for DVT especially given the mechanism and the fact that she is on 2 anticoagulants.  I recommend Ace compression activity as tolerated.  Also use heat.  Follow-up in about 6 weeks if she does not feel any improvement.  Follow-Up Instructions: No follow-ups on file.   Orders:  Orders Placed This Encounter  Procedures   XR KNEE 3 VIEW RIGHT   No orders of the defined types were placed in this encounter.     Procedures: No procedures performed   Clinical Data: No additional findings.   Subjective: Chief Complaint  Patient presents with   Right Knee - Pain    HPI [name] is a 62 year old female who is well-known to me who comes in for evaluation of right calf pain that started yesterday.  She was walking and felt a pulling sensation.  She feels like the leg is swollen.  She takes tramadol for breakthrough pain.  Review of Systems  Constitutional: Negative.   HENT: Negative.    Eyes: Negative.   Respiratory: Negative.    Cardiovascular: Negative.   Endocrine: Negative.   Musculoskeletal: Negative.   Neurological: Negative.   Hematological: Negative.   Psychiatric/Behavioral: Negative.    All other systems reviewed and are negative.    Objective: Vital Signs: LMP 06/03/2011   Physical Exam Vitals and nursing note reviewed.  Constitutional:      Appearance: She is well-developed.  HENT:     Head: Normocephalic and atraumatic.  Pulmonary:     Effort: Pulmonary effort is normal.  Abdominal:     Palpations: Abdomen is soft.  Musculoskeletal:     Cervical back:  Neck supple.  Skin:    General: Skin is warm.     Capillary Refill: Capillary refill takes less than 2 seconds.  Neurological:     Mental Status: She is alert and oriented to person, place, and time.  Psychiatric:        Behavior: Behavior normal.        Thought Content: Thought content normal.        Judgment: Judgment normal.     Ortho Exam Examination of the right knee is unremarkable.  She has tenderness to the calf.  I do not see any significant swelling.  Negative Homans. Specialty Comments:  No specialty comments available.  Imaging: No results found.   PMFS History: Patient Active Problem List   Diagnosis Date Noted   Sinus pain 09/14/2022   Acute cough 09/14/2022   Acquired thrombophilia (HCC) 02/21/2022   Uncontrolled type 2 diabetes mellitus with hyperglycemia (HCC) 02/16/2022   Nasal congestion 02/16/2022   Hypertensive heart disease without heart failure 02/16/2022   Status post total replacement of left hip 05/11/2021   Abnormal glucose 02/21/2021   Class 2 severe obesity due to excess calories with serious comorbidity and body mass index (BMI) of 37.0 to 37.9 in adult [facility]) 02/21/2021   Internal carotid  artery stenosis, left 09/23/2020   Preoperative evaluation of a medical condition to rule out surgical contraindications (TAR required) 09/12/2020   Paroxysmal atrial fibrillation (HCC) 04/28/2020   OSA (obstructive sleep apnea) 04/28/2020   Hypokalemia    Pulmonary hypertension, primary (HCC)    Elevated troponin    Primary osteoarthritis of right knee 11/20/2019   Cerebrovascular accident (CVA) (HCC) 10/18/2019   ICAO (internal carotid artery occlusion), right 10/18/2019   Primary osteoarthritis of left hip 09/11/2019   SAH (subarachnoid hemorrhage) (HCC) 08/07/2019   Acute embolic stroke (HCC)    Left hip pain 06/25/2019   Severe left groin pain 06/21/2019   Bilateral carotid artery stenosis 06/21/2019   Pure hypercholesterolemia 05/31/2019   CAD in  native artery 04/09/2019   Complicated migraine 08/23/2018   Hyperglycemia 07/10/2018   Abnormal stress test 08/06/2016   Essential hypertension 06/30/2016   Past Medical History:  Diagnosis Date   Allergy    Arthritis    back    Atrial fibrillation (HCC)    Atypical chest pain 06/30/2016   Back pain    DUE TO INJURY AT WORK ON05/2013   Bruises easily    Chronic lower back pain    Coronary artery disease    Headache(784.0)    rarely   History of bronchitis    last time about 45yrs ago    Hypertension    takes Benicar daily   OSA (obstructive sleep apnea) 04/28/2020   Preoperative evaluation of a medical condition to rule out surgical contraindications (TAR required) 09/12/2020   Pure hypercholesterolemia 05/31/2019   Stroke (HCC)    no residual   Vitamin D deficiency    takes Vitamin D 2 times/wk   Weakness    and numbness right foot    Family History  Problem Relation Age of Onset   Heart disease Mother    CAD Mother    Stroke Mother    Heart disease Sister    Heart attack Sister    Liver disease Brother    Other Father        unknown medical history   CAD Brother    Colon cancer Neg Hx    Esophageal cancer Neg Hx    Rectal cancer Neg Hx    Stomach cancer Neg Hx     Past Surgical History:  Procedure Laterality Date   BACK SURGERY     CARPAL TUNNEL RELEASE Right    CESAREAN SECTION  1988   COLONOSCOPY     EXPLORATORY LAPAROTOMY  1991   "took out fatty tumor" (11/09/2012)   IR ANGIO INTRA EXTRACRAN SEL COM CAROTID INNOMINATE BILAT MOD SED  08/10/2019   IR ANGIO INTRA EXTRACRAN SEL COM CAROTID INNOMINATE BILAT MOD SED  10/07/2020   IR ANGIO INTRA EXTRACRAN SEL COM CAROTID INNOMINATE UNI L MOD SED  11/05/2020   IR ANGIO VERTEBRAL SEL SUBCLAVIAN INNOMINATE BILAT MOD SED  10/07/2020   IR ANGIO VERTEBRAL SEL VERTEBRAL BILAT MOD SED  08/10/2019   IR CT HEAD LTD  11/05/2020   IR INTRAVSC STENT CERV CAROTID W/EMB-PROT MOD SED INCL ANGIO  11/05/2020   IR RADIOLOGIST  EVAL & MGMT  11/21/2020   IR US GUIDE VASC ACCESS RIGHT  08/10/2019   IR US GUIDE VASC ACCESS RIGHT  10/07/2020   LEFT HEART CATH AND CORONARY ANGIOGRAPHY N/A 08/06/2016   Procedure: Left Heart Cath and Coronary Angiography;  Surgeon: Lyn Records, MD;  Location: [facility] INVASIVE CV LAB;  Service: Cardiovascular;  Laterality:  N/A;   LEFT HEART CATH AND CORONARY ANGIOGRAPHY N/A 07/12/2018   Procedure: LEFT HEART CATH AND CORONARY ANGIOGRAPHY;  Surgeon: Marykay Lex, MD;  Location: [facility] INVASIVE CV LAB;  Service: Cardiovascular;  Laterality: N/A;   LUMBAR LAMINECTOMY/DECOMPRESSION MICRODISCECTOMY  11/09/2012   "L3-4" (11/09/2012)   LUMBAR LAMINECTOMY/DECOMPRESSION MICRODISCECTOMY N/A 11/09/2012   Procedure: L3-L4 DECOMPRESSION AND MICRODISCECTOMY   (1 LEVEL);  Surgeon: Venita Lick, MD;  Location: [facility] OR;  Service: Orthopedics;  Laterality: N/A;   RADIOLOGY WITH ANESTHESIA N/A 11/05/2020   Procedure: STENTING;  Surgeon: Julieanne Cotton, MD;  Location: [facility] OR;  Service: Radiology;  Laterality: N/A;   TOTAL HIP ARTHROPLASTY Left 05/11/2021   Procedure: LEFT TOTAL HIP ARTHROPLASTY ANTERIOR APPROACH;  Surgeon: Tarry Kos, MD;  Location: [facility] OR;  Service: Orthopedics;  Laterality: Left;  3-C   Social History   Occupational History   Occupation: Control and instrumentation engineer  Tobacco Use   Smoking status: Never   Smokeless tobacco: Never  Vaping Use   Vaping status: Never Used  Substance and Sexual Activity   Alcohol use: Not Currently    Alcohol/week: 2.0 standard drinks of alcohol    Types: 2 Glasses of wine per week    Comment: social- 2 wine or 2 beers   Drug use: Never   Sexual activity: Yes    Birth control/protection: Post-menopausal

## 2022-09-24 NOTE — Patient Instructions (Signed)

## 2022-09-28 ENCOUNTER — Encounter: Payer: Self-pay | Admitting: Internal Medicine

## 2022-09-28 ENCOUNTER — Ambulatory Visit: Payer: 59 | Admitting: Internal Medicine

## 2022-09-28 VITALS — BP 126/82 | HR 100 | Temp 98.4°F | Ht 61.0 in | Wt 201.2 lb

## 2022-09-28 DIAGNOSIS — E785 Hyperlipidemia, unspecified: Secondary | ICD-10-CM

## 2022-09-28 DIAGNOSIS — Z23 Encounter for immunization: Secondary | ICD-10-CM

## 2022-09-28 DIAGNOSIS — Z7985 Long-term (current) use of injectable non-insulin antidiabetic drugs: Secondary | ICD-10-CM

## 2022-09-28 DIAGNOSIS — E1169 Type 2 diabetes mellitus with other specified complication: Secondary | ICD-10-CM | POA: Diagnosis not present

## 2022-09-28 DIAGNOSIS — E66812 Obesity, class 2: Secondary | ICD-10-CM

## 2022-09-28 DIAGNOSIS — Z6838 Body mass index (BMI) 38.0-38.9, adult: Secondary | ICD-10-CM

## 2022-09-28 DIAGNOSIS — I119 Hypertensive heart disease without heart failure: Secondary | ICD-10-CM

## 2022-09-28 DIAGNOSIS — D6869 Other thrombophilia: Secondary | ICD-10-CM

## 2022-09-28 DIAGNOSIS — I480 Paroxysmal atrial fibrillation: Secondary | ICD-10-CM | POA: Diagnosis not present

## 2022-09-28 DIAGNOSIS — E7800 Pure hypercholesterolemia, unspecified: Secondary | ICD-10-CM

## 2022-09-28 DIAGNOSIS — I270 Primary pulmonary hypertension: Secondary | ICD-10-CM

## 2022-09-28 MED ORDER — MOUNJARO 7.5 MG/0.5ML ~~LOC~~ SOAJ: 7.5000 mg | SUBCUTANEOUS | 1 refills | Status: DC

## 2022-09-28 NOTE — Progress Notes (Unsigned)
I,[name], CMA,acting as a Neurosurgeon for [name], MD.,have documented all relevant documentation on the behalf of [name], MD,as directed by  [name], MD while in the presence of [name], MD.  Subjective:  Patient ID: [name] , female    DOB: 06-22-1960 , 62 y.o.   MRN: [id number]  Chief Complaint  Patient presents with  . Diabetes  . Hyperlipidemia    HPI  Patient presents today for a blood pressure & diabetes f/u. She reports compliance with her meds. She denies headaches, chest pain and shortness of breath.    Hypertension This is a chronic problem. The current episode started more than 1 year ago. The problem has been gradually improving since onset. The problem is controlled. Pertinent negatives include no blurred vision, palpitations or shortness of breath. The current treatment provides moderate improvement. Compliance problems include exercise.   Diabetes She presents for her follow-up diabetic visit. She has type 2 diabetes mellitus. There are no hypoglycemic associated symptoms. Pertinent negatives for diabetes include no blurred vision. There are no hypoglycemic complications. Risk factors for coronary artery disease include diabetes mellitus, dyslipidemia, hypertension, obesity, post-menopausal and sedentary lifestyle. Meal planning includes avoidance of concentrated sweets. She has not had a previous visit with a dietitian. She participates in exercise intermittently. An ACE inhibitor/angiotensin II receptor blocker is being taken.     Past Medical History:  Diagnosis Date  . Allergy   . Arthritis    back   . Atrial fibrillation (HCC)   . Atypical chest pain 06/30/2016  . Back pain    DUE TO INJURY AT WORK ON05/2013  . Bruises easily   . Chronic lower back pain   . Coronary artery disease   . Headache(784.0)    rarely  . History of bronchitis    last time about 69yrs ago   . Hypertension    takes Benicar daily  . OSA  (obstructive sleep apnea) 04/28/2020  . Preoperative evaluation of a medical condition to rule out surgical contraindications (TAR required) 09/12/2020  . Pure hypercholesterolemia 05/31/2019  . Stroke [facility])    no residual  . Vitamin D deficiency    takes Vitamin D 2 times/wk  . Weakness    and numbness right foot     Family History  Problem Relation Age of Onset  . Heart disease Mother   . CAD Mother   . Stroke Mother   . Heart disease Sister   . Heart attack Sister   . Liver disease Brother   . Other Father        unknown medical history  . CAD Brother   . Colon cancer Neg Hx   . Esophageal cancer Neg Hx   . Rectal cancer Neg Hx   . Stomach cancer Neg Hx      Current Outpatient Medications:  .  atorvastatin (LIPITOR) 80 MG tablet, TAKE 1 TABLET BY MOUTH DAILY AT 6 PM., Disp: 90 tablet, Rfl: 2 .  benzonatate (TESSALON PERLES) 100 MG capsule, Take 1 capsule (100 mg total) by mouth 3 (three) times daily as needed for cough., Disp: 30 capsule, Rfl: 1 .  cholecalciferol (VITAMIN D3) 25 MCG (1000 UNIT) tablet, Take 1,000 Units by mouth daily. , Disp: , Rfl:  .  diltiazem (CARDIZEM CD) 120 MG 24 hr capsule, TAKE 1 CAPSULE BY MOUTH EVERY DAY, Disp: 90 capsule, Rfl: 2 .  ELIQUIS 5 MG TABS tablet, TAKE 1 TABLET BY MOUTH  TWICE A DAY, Disp: 60 tablet, Rfl: 5 .  ezetimibe (ZETIA) 10 MG tablet, TAKE 1 TABLET BY MOUTH EVERY DAY, Disp: 30 tablet, Rfl: 8 .  fluticasone (FLONASE) 50 MCG/ACT nasal spray, PLACE 2 SPRAYS IN BOTH NOSTRILS DAILY AS NEEDED FOR ALLERGIES OR RHINITIS, Disp: 16 mL, Rfl: 8 .  levocetirizine (XYZAL) 5 MG tablet, Take 1 tablet (5 mg total) by mouth daily., Disp: 90 tablet, Rfl: 2 .  Magnesium 250 MG TABS, TAKE 1 TABLET BY MOUTH DAILY WITH EVENING MEALS, Disp: 90 tablet, Rfl: 2 .  Olmesartan-amLODIPine-HCTZ 20-5-12.5 MG TABS, Take 1 tablet by mouth daily., Disp: 90 tablet, Rfl: 2 .  ticagrelor (BRILINTA) 90 MG TABS tablet, Take 1 tablet (90 mg total) by mouth 2 (two)  times daily., Disp: 60 tablet, Rfl: 3 .  tirzepatide (MOUNJARO) 5 MG/0.5ML Pen, INJECT 5 MG SUBCUTANEOUSLY WEEKLY, Disp: 2 mL, Rfl: 1 .  topiramate (TOPAMAX) 100 MG tablet, Take 1 tablet (100 mg total) by mouth at bedtime., Disp: 90 tablet, Rfl: 4 .  traMADol (ULTRAM) 50 MG tablet, Take 1 tablet (50 mg total) by mouth daily as needed., Disp: 30 tablet, Rfl: 0   Allergies  Allergen Reactions  . Other Itching and Other (See Comments)    Patient experienced intense itching in her arm that was accessed for a past angiogram     Review of Systems  Constitutional: Negative.   Eyes:  Negative for blurred vision.  Respiratory: Negative.  Negative for shortness of breath.   Cardiovascular: Negative.  Negative for palpitations.  Neurological: Negative.   Psychiatric/Behavioral: Negative.       Today's Vitals   09/28/22 0917  BP: 126/82  Pulse: 100  Temp: 98.4 F (36.9 C)  SpO2: 99%  Weight: 201 lb 3.2 oz (91.3 kg)  Height: 5\' 1"  (1.549 m)   Body mass index is 38.02 kg/m.  Wt Readings from Last 3 Encounters:  09/28/22 201 lb 3.2 oz (91.3 kg)  09/08/22 201 lb (91.2 kg)  05/24/22 198 lb 9.6 oz (90.1 kg)     Objective:  Physical Exam Vitals and nursing note reviewed.  Constitutional:      Appearance: Normal appearance. She is obese.  HENT:     Head: Normocephalic and atraumatic.  Eyes:     Extraocular Movements: Extraocular movements intact.  Cardiovascular:     Rate and Rhythm: Normal rate and regular rhythm.     Heart sounds: Normal heart sounds.  Pulmonary:     Effort: Pulmonary effort is normal.     Breath sounds: Normal breath sounds.  Musculoskeletal:     Cervical back: Normal range of motion.  Skin:    General: Skin is warm.  Neurological:     General: No focal deficit present.     Mental Status: She is alert.  Psychiatric:        Mood and Affect: Mood normal.        Behavior: Behavior normal.        Assessment And Plan:  Dyslipidemia associated with type 2  diabetes mellitus (HCC)  Hypertensive heart disease without heart failure  Pure hypercholesterolemia  Paroxysmal atrial fibrillation (HCC)  Acquired thrombophilia (HCC)  Immunization due -     Flu vaccine trivalent PF, 6mos and older(Flulaval,Afluria,Fluarix,Fluzone)  Class 2 severe obesity due to excess calories with serious comorbidity and body mass index (BMI) of 38.0 to 38.9 in adult [facility])  She is encouraged to strive for BMI less than 30 to decrease cardiac risk. Advised to  aim for at least 150 minutes of exercise per week.    Return if symptoms worsen or fail to improve, for KEEP HM APPT.  Patient was given opportunity to ask questions. Patient verbalized understanding of the plan and was able to repeat key elements of the plan. All questions were answered to their satisfaction.    I, [name], MD, have reviewed all documentation for this visit. The documentation on 09/28/22 for the exam, diagnosis, procedures, and orders are all accurate and complete.   IF YOU HAVE BEEN REFERRED TO A SPECIALIST, IT MAY TAKE 1-2 WEEKS TO SCHEDULE/PROCESS THE REFERRAL. IF YOU HAVE NOT HEARD FROM US/SPECIALIST IN TWO WEEKS, PLEASE GIVE [place] A CALL AT [phone].   THE PATIENT IS ENCOURAGED TO PRACTICE SOCIAL DISTANCING DUE TO THE COVID-19 PANDEMIC.

## 2022-09-30 DIAGNOSIS — E1169 Type 2 diabetes mellitus with other specified complication: Secondary | ICD-10-CM | POA: Insufficient documentation

## 2022-09-30 NOTE — Assessment & Plan Note (Signed)
Chronic, most recent echo results reviewed. Mildly elevated pulmonary artery systolic pressure was noted on exam. F/u as per Cardiology.

## 2022-09-30 NOTE — Assessment & Plan Note (Signed)
Chronic, she will continue with Eliquis for underlying PAF.

## 2022-09-30 NOTE — Assessment & Plan Note (Signed)
Chronic, currently in sinus rhythm. She is on Eliquis for anticoagulation and diltiazem for rate control.

## 2022-09-30 NOTE — Assessment & Plan Note (Signed)
She is encouraged to strive for BMI less than 30 to decrease cardiac risk. Advised to aim for at least 150 minutes of exercise/week.

## 2022-09-30 NOTE — Assessment & Plan Note (Signed)
Chronic, LDL goal is less than 70.  She will continue with atorvastatin 80mg  daily. I will increase dose of Mounjaro to 7.5mg  weekly. She is reminded to be intentional regarding protein intake and to incorporate at least two days of strength training twice weekly.

## 2022-09-30 NOTE — Assessment & Plan Note (Signed)
Chronic, fair control. Goal BP<120/80.  She will continue with Cardizem CD 120mg  daily and olmesartan/amlodipine/hydrochlorothiazide 20/5/12.5mg  daily. She is encouraged to follow low sodium diet.

## 2022-10-04 ENCOUNTER — Telehealth: Payer: Self-pay | Admitting: Student

## 2022-10-04 MED ORDER — TICAGRELOR 90 MG PO TABS: 90.0000 mg | ORAL_TABLET | Freq: Two times a day (BID) | ORAL | 3 refills | Status: DC

## 2022-10-04 NOTE — Telephone Encounter (Signed)
Pharmacy requested refill of patient's prescribed ticagrelor 90 mg BID. Refill sent to requesting pharmacy.  Kennieth Francois, PA-C 10/04/2022 3:26 PM

## 2022-10-05 ENCOUNTER — Other Ambulatory Visit: Payer: Self-pay | Admitting: Internal Medicine

## 2022-10-25 ENCOUNTER — Ambulatory Visit: Payer: 59 | Admitting: Neurology

## 2022-10-25 ENCOUNTER — Encounter: Payer: Self-pay | Admitting: Neurology

## 2022-10-25 VITALS — BP 127/64 | HR 79 | Ht 61.0 in | Wt 207.0 lb

## 2022-10-25 DIAGNOSIS — G43709 Chronic migraine without aura, not intractable, without status migrainosus: Secondary | ICD-10-CM | POA: Insufficient documentation

## 2022-10-25 DIAGNOSIS — I6523 Occlusion and stenosis of bilateral carotid arteries: Secondary | ICD-10-CM

## 2022-10-25 DIAGNOSIS — G4733 Obstructive sleep apnea (adult) (pediatric): Secondary | ICD-10-CM

## 2022-10-25 DIAGNOSIS — I639 Cerebral infarction, unspecified: Secondary | ICD-10-CM | POA: Diagnosis not present

## 2022-10-25 MED ORDER — TOPIRAMATE 100 MG PO TABS: 100.0000 mg | ORAL_TABLET | Freq: Every day | ORAL | 4 refills | Status: DC

## 2022-10-25 NOTE — Progress Notes (Signed)
Patient: [name] Date of Birth: [birth year]  Reason for Visit: Follow up History from: Patient Primary Neurologist: Terrace Arabia  ASSESSMENT AND PLAN 62 y.o. year old female   1.  Stroke -History of right MCA stroke and subarachnoid hemorrhage in the setting of aspirin and Plavix -Strict management of vascular risk factors with a goal BP less than 130/90, A1c less than 7.0, LDL less than 70 for secondary stroke prevention  2.  Bilateral carotid artery stenosis -Post stent to left ICA in October 2022 with Dr. Corliss Skains -Carotid US Aug 2024 showed appearance of recannulization of previously occluded right ICA.  Left ICA stent with 50 to 75% stenosis. -Keep close follow up with Dr. Corliss Skains, remains on Brilinta, reports annual follow-up with Dr. Earlene Plater for  3.  A-fib -Follows with cardiology, on Eliquis 5 mg twice daily  4.  Headache with migraine features -Doing excellent, continue Topamax 100 mg at bedtime  5.  OSA on CPAP -Bring CPAP AutoPap with download  Follow-up with me in 1 year or sooner if needed.  HISTORY  [name] is a 62 year old female, seen in request by her primary care physician Dr. Allyne Gee, [facility] for episode of visual loss, headaches, initial evaluation was on August 23, 2018.   I have reviewed and summarized the referring note from the referring physician.  She had past medical history of hyperlipidemia, hypertension, coronary artery disease, status post stent on July 12, 2018, currently taking aspirin and Plavix   She denies a previous history of migraine headaches, on August 18, 2018, while sitting at the computer, she noticed difficulty reading, then bilateral vision went out, she was able to lie down with eyes closed resting for 30 minutes, her vision came back normal, at the same time she noticed mild left temporal region throbbing headaches, when she went back to look at computer again, she experienced visual loss bilaterally again, was sent to the  emergency room   I personally reviewed MRI of the brain no acute abnormality   MRA of neck: 50% stenosis of left internal carotid artery, critical stenosis of proximal right internal carotid artery, estimated to be greater than 80% stenosis, 50% stenosis of the left internal carotid artery   MRA of the brain showed no significant large vessel stenosis intracranially   She experienced a severe headache on August 08/2018, severe holoacranial pounding headache with associated light noise sensitivity, nauseous that was helped by Tylenol   Laboratory evaluations in 2020, LDL 148, cholesterol 217, negative C-reactive protein, ESR was mildly elevated 50, UDS was negative, normal TSH, A1c 5.9   UPDATE June 21 2019: Her headache has much improved, tolerating Topamax 100 mg every night   She remained on aspirin and Plavix, she did not have ultrasound of carotid artery to follow-up her severe right carotid artery stenosis, more than 80% based on MRA in 2020   She also complains of low back pain, radiating pain to left hip, left groin area   UPDATE Oct 18 2019: She is accompanied by her friends at today's clinical visit, we went over her medical history in detail   Ultrasound of carotid artery on June 27 2019 showed 80 to 99% stenosis of right internal carotid artery, left side was 40 to 59% stenosis   She was referred to vascular surgeon   X-ray of left hip showed end-stage degenerative changes of left hip, pending left hip replacement surgery   On August 07, 2019, she had severe headache on the right  side, also describe left leg weakness.  MRI of the brain showed 2 subcentimeter foci of acute infarction in the right insular, affecting the cortex, also subarachnoid hemorrhage within the sulci on the right   Four-vessel angiogram showed occluded right internal carotid artery at the bulb, 30% stenosis of left internal carotid artery   Repeat MRI of the brain on September 06, 2019 showed subtle  hemosiderin within the right frontal sulci, no acute abnormality   Laboratory evaluations in September 2021 showed normal B12, CMP, creatinine of 1.0, sodium of 133, normal CBC hemoglobin of 11.5, normal vitamin D 33.7, negative hepatitis C, A1c of 6.5,   UPDATE September 09 2020: I personally reviewed MRI of the brain on March 16, 2020, there was no acute abnormalities.   Laboratory evaluations in 2022: CBC, hemoglobin of 11, hematocrit of 35,   She is taking Topamax 100mg  qhs as migraine prevention.   I was able to review cardiologist Dr. Leonides Sake evaluation: 07/30/2020, new onset atrial fibrillation, with rapid ventricular rate, was put on Eliquis 5 mg twice daily  since.   Patient is at high risk for thromboembolic event due to her new onset paroxysmal atrial fibrillation, and also 100% occlusion of right internal carotid artery, mild to moderate stenosis of left internal carotid artery,   I will order repeat ultrasound of carotid artery to evaluate the stenosis on the left side   If she is to proceed with left hip replacement, she would need bridging therapy, either IV heparin or subcutaneous Lovenox to avoid the thromboembolic event  Update October 27, 2021 SS: 11/05/20 admitted for cerebral angiogram, stent was placed to the left proximal ICA.  Started on Brilinta along with Eliquis. 04/08/21 carotid ultrasound showed total occlusion of right ICA, left ICA stent 50 to 75% stenosis.  Scheduled for repeat carotid ultrasound this Friday.  Had a left hip replacement in July. Migraines are doing well, needs refill on Topamax 100 mg at bedtime, no migraines since taking, has ran out for 2 weeks, has had slight headache. Cardiology stopped aspirin. Uses CPAP, has card download, will bring by, no issues reported with machine. Does mention has form from [employer] needs completed in the event of loss of power. Continues to work at [employer].  Addendum 11/19/2021 SS: She brings her CPAP card,  report 10/20/2021 to 11/18/2021 shows usage 26/30 days at 87%.  Greater than 4 hours 73%.  Average usage days used 6.2 hours.  Minimum pressure 6, maximum pressure 16.  Leak in the 95th percentile 31.8, AHI 3.9. looking at leak data, once she got a new nasal mask, no more leaks.   Update October 25, 2022 SS: Carotid US Aug 2024 showed appearance of recannulization of previously occluded right ICA.  Left ICA stent with 50 to 75% stenosis. Didn't bring her CPAP, is using it tho, will bring it by. Remains on Topamax 100 mg at bedtime, denies any migraines, no side effects, wants to leave as is. Still on Eliquis and Brillinta. Saw Dr. Corliss Skains, got good report with right sided is now open without intervention, follow up in 1 year. Is on Mounjaro, still looking for weight loss benefit.  A1C 6.2 Sept 2024, Lipid panel Nov 2023 LDL 46. Works from home now. Uses FFM, she would like the mask that sits underneath her nose and covers her mouth. Has form to complete via [employer] with her CPAP.   REVIEW OF SYSTEMS: Out of a complete 14 system review of symptoms, the patient  complains only of the following symptoms, and all other reviewed systems are negative.  See HPI  ALLERGIES: Allergies  Allergen Reactions   Other Itching and Other (See Comments)    Patient experienced intense itching in her arm that was accessed for a past angiogram    HOME MEDICATIONS: Outpatient Medications Prior to Visit  Medication Sig Dispense Refill   atorvastatin (LIPITOR) 80 MG tablet TAKE 1 TABLET BY MOUTH DAILY AT 6 PM. 90 tablet 2   benzonatate (TESSALON PERLES) 100 MG capsule Take 1 capsule (100 mg total) by mouth 3 (three) times daily as needed for cough. 30 capsule 1   cholecalciferol (VITAMIN D3) 25 MCG (1000 UNIT) tablet Take 1,000 Units by mouth daily.      diltiazem (CARDIZEM CD) 120 MG 24 hr capsule TAKE 1 CAPSULE BY MOUTH EVERY DAY 90 capsule 2   ELIQUIS 5 MG TABS tablet TAKE 1 TABLET BY MOUTH TWICE A DAY 60 tablet  5   ezetimibe (ZETIA) 10 MG tablet TAKE 1 TABLET BY MOUTH EVERY DAY 30 tablet 8   fluticasone (FLONASE) 50 MCG/ACT nasal spray PLACE 2 SPRAYS IN BOTH NOSTRILS DAILY AS NEEDED FOR ALLERGIES OR RHINITIS 16 mL 8   levocetirizine (XYZAL) 5 MG tablet Take 1 tablet (5 mg total) by mouth daily. 90 tablet 2   Magnesium 250 MG TABS TAKE 1 TABLET BY MOUTH DAILY WITH EVENING MEALS 90 tablet 2   Olmesartan-amLODIPine-HCTZ 20-5-12.5 MG TABS Take 1 tablet by mouth daily. 90 tablet 2   ticagrelor (BRILINTA) 90 MG TABS tablet Take 1 tablet (90 mg total) by mouth 2 (two) times daily. 60 tablet 3   tirzepatide (MOUNJARO) 7.5 MG/0.5ML Pen Inject 7.5 mg into the skin once a week. 2 mL 1   traMADol (ULTRAM) 50 MG tablet Take 1 tablet (50 mg total) by mouth daily as needed. 30 tablet 0   topiramate (TOPAMAX) 100 MG tablet Take 1 tablet (100 mg total) by mouth at bedtime. 90 tablet 4   No facility-administered medications prior to visit.    PAST MEDICAL HISTORY: Past Medical History:  Diagnosis Date   Allergy    Arthritis    back    Atrial fibrillation (HCC)    Atypical chest pain 06/30/2016   Back pain    DUE TO INJURY AT WORK ON05/2013   Bruises easily    Chronic lower back pain    Coronary artery disease    Headache(784.0)    rarely   History of bronchitis    last time about 85yrs ago    Hypertension    takes Benicar daily   OSA (obstructive sleep apnea) 04/28/2020   Preoperative evaluation of a medical condition to rule out surgical contraindications (TAR required) 09/12/2020   Pure hypercholesterolemia 05/31/2019   Stroke (HCC)    no residual   Vitamin D deficiency    takes Vitamin D 2 times/wk   Weakness    and numbness right foot    PAST SURGICAL HISTORY: Past Surgical History:  Procedure Laterality Date   BACK SURGERY     CARPAL TUNNEL RELEASE Right    CESAREAN SECTION  1988   COLONOSCOPY     EXPLORATORY LAPAROTOMY  1991   "took out fatty tumor" (11/09/2012)   IR ANGIO INTRA  EXTRACRAN SEL COM CAROTID INNOMINATE BILAT MOD SED  08/10/2019   IR ANGIO INTRA EXTRACRAN SEL COM CAROTID INNOMINATE BILAT MOD SED  10/07/2020   IR ANGIO INTRA EXTRACRAN SEL COM CAROTID INNOMINATE UNI L  MOD SED  11/05/2020   IR ANGIO VERTEBRAL SEL SUBCLAVIAN INNOMINATE BILAT MOD SED  10/07/2020   IR ANGIO VERTEBRAL SEL VERTEBRAL BILAT MOD SED  08/10/2019   IR CT HEAD LTD  11/05/2020   IR INTRAVSC STENT CERV CAROTID W/EMB-PROT MOD SED INCL ANGIO  11/05/2020   IR RADIOLOGIST EVAL & MGMT  11/21/2020   IR US GUIDE VASC ACCESS RIGHT  08/10/2019   IR US GUIDE VASC ACCESS RIGHT  10/07/2020   LEFT HEART CATH AND CORONARY ANGIOGRAPHY N/A 08/06/2016   Procedure: Left Heart Cath and Coronary Angiography;  Surgeon: Lyn Records, MD;  Location: [facility] INVASIVE CV LAB;  Service: Cardiovascular;  Laterality: N/A;   LEFT HEART CATH AND CORONARY ANGIOGRAPHY N/A 07/12/2018   Procedure: LEFT HEART CATH AND CORONARY ANGIOGRAPHY;  Surgeon: Marykay Lex, MD;  Location: [facility] INVASIVE CV LAB;  Service: Cardiovascular;  Laterality: N/A;   LUMBAR LAMINECTOMY/DECOMPRESSION MICRODISCECTOMY  11/09/2012   "L3-4" (11/09/2012)   LUMBAR LAMINECTOMY/DECOMPRESSION MICRODISCECTOMY N/A 11/09/2012   Procedure: L3-L4 DECOMPRESSION AND MICRODISCECTOMY   (1 LEVEL);  Surgeon: Venita Lick, MD;  Location: [facility] OR;  Service: Orthopedics;  Laterality: N/A;   RADIOLOGY WITH ANESTHESIA N/A 11/05/2020   Procedure: STENTING;  Surgeon: Julieanne Cotton, MD;  Location: [facility] OR;  Service: Radiology;  Laterality: N/A;   TOTAL HIP ARTHROPLASTY Left 05/11/2021   Procedure: LEFT TOTAL HIP ARTHROPLASTY ANTERIOR APPROACH;  Surgeon: Tarry Kos, MD;  Location: [facility] OR;  Service: Orthopedics;  Laterality: Left;  3-C    FAMILY HISTORY: Family History  Problem Relation Age of Onset   Heart disease Mother    CAD Mother    Stroke Mother    Heart disease Sister    Heart attack Sister    Liver disease Brother    Other Father        unknown medical history    CAD Brother    Colon cancer Neg Hx    Esophageal cancer Neg Hx    Rectal cancer Neg Hx    Stomach cancer Neg Hx     SOCIAL HISTORY: Social History   Socioeconomic History   Marital status: Divorced    Spouse name: Not on file   Number of children: 1   Years of education: Not on file   Highest education level: Some college, no degree  Occupational History   Occupation: Control and instrumentation engineer  Tobacco Use   Smoking status: Never   Smokeless tobacco: Never  Vaping Use   Vaping status: Never Used  Substance and Sexual Activity   Alcohol use: Not Currently    Alcohol/week: 2.0 standard drinks of alcohol    Types: 2 Glasses of wine per week    Comment: social- 2 wine or 2 beers   Drug use: Never   Sexual activity: Yes    Birth control/protection: Post-menopausal  Other Topics Concern   Not on file  Social History Narrative   Right-handed.   Occasional caffeine.   Lives alone.   Social Determinants of Health   Financial Resource Strain: Low Risk  (05/21/2022)   Overall Financial Resource Strain (CARDIA)    Difficulty of Paying Living Expenses: Not hard at all  Food Insecurity: No Food Insecurity (05/21/2022)   Hunger Vital Sign    Worried About Running Out of Food in the Last Year: Never true    Ran Out of Food in the Last Year: Never true  Transportation Needs: No Transportation Needs (05/21/2022)   PRAPARE - Transportation  Lack of Transportation (Medical): No    Lack of Transportation (Non-Medical): No  Physical Activity: Insufficiently Active (05/21/2022)   Exercise Vital Sign    Days of Exercise per Week: 3 days    Minutes of Exercise per Session: 30 min  Stress: No Stress Concern Present (05/21/2022)   [employer] of Occupational Health - Occupational Stress Questionnaire    Feeling of Stress : Not at all  Social Connections: Moderately Integrated (05/21/2022)   Social Connection and Isolation Panel [NHANES]    Frequency of Communication with Friends  and Family: Twice a week    Frequency of Social Gatherings with Friends and Family: Twice a week    Attends Religious Services: More than 4 times per year    Active Member of [employer] or Organizations: Yes    Attends Banker Meetings: 1 to 4 times per year    Marital Status: Divorced  Catering manager Violence: Not on file   PHYSICAL EXAM  Vitals:   10/25/22 0903  BP: 127/64  Pulse: 79  Weight: 207 lb (93.9 kg)  Height: 5\' 1"  (1.549 m)    Body mass index is 39.11 kg/m.  Generalized: Well developed, in no acute distress  Neurological examination  Mentation: Alert oriented to time, place, history taking. Follows all commands speech and language fluent Cranial nerve II-XII: Pupils were equal round reactive to light. Extraocular movements were full, visual field were full on confrontational test. Facial sensation and strength were normal.  Head turning and shoulder shrug  were normal and symmetric. Motor: The motor testing reveals 5 over 5 strength of all 4 extremities. Good symmetric motor tone is noted throughout.  Sensory: Sensory testing is intact to soft touch on all 4 extremities. No evidence of extinction is noted.  Coordination: Cerebellar testing reveals good finger-nose-finger and heel-to-shin bilaterally.  Gait and station: Gait is normal.  Reflexes: Deep tendon reflexes are symmetric and normal bilaterally.   DIAGNOSTIC DATA (LABS, IMAGING, TESTING) - I reviewed patient records, labs, notes, testing and imaging myself where available.  Lab Results  Component Value Date   WBC 8.4 11/23/2021   HGB 12.1 11/23/2021   HCT 36.2 11/23/2021   MCV 86 11/23/2021   PLT 330 11/23/2021      Component Value Date/Time   NA 142 09/28/2022 1016   K 4.1 09/28/2022 1016   CL 104 09/28/2022 1016   CO2 22 09/28/2022 1016   GLUCOSE 102 (H) 09/28/2022 1016   GLUCOSE 101 (H) 04/29/2021 1325   BUN 13 09/28/2022 1016   CREATININE 1.00 09/28/2022 1016   CALCIUM 10.0  09/28/2022 1016   PROT 7.8 05/24/2022 1634   ALBUMIN 4.6 05/24/2022 1634   AST 19 05/24/2022 1634   ALT 19 05/24/2022 1634   ALKPHOS 81 05/24/2022 1634   BILITOT 0.5 05/24/2022 1634   GFRNONAA >60 04/29/2021 1325   GFRAA 70 09/18/2019 1622   Lab Results  Component Value Date   CHOL 126 11/23/2021   HDL 68 11/23/2021   LDLCALC 46 11/23/2021   TRIG 54 11/23/2021   CHOLHDL 1.9 11/23/2021   Lab Results  Component Value Date   HGBA1C 6.2 (H) 09/28/2022   Lab Results  Component Value Date   VITAMINB12 844 09/18/2019   Lab Results  Component Value Date   TSH 1.440 05/24/2022    Margie Ege, AGNP-C, DNP 10/25/2022, 9:45 AM [facility] [address], [address] [place], [place] [address] [phone]

## 2022-10-25 NOTE — Patient Instructions (Signed)
Continue Topamax for migraine prevention  Continue follow up with Dr. Corliss Skains  Strict management of vascular risk factors with a goal BP less than 130/90, A1c less than 7.0, LDL less than 70 for secondary stroke prevention Bring your CPAP by this week and we can pull a download Continue nightly CPAP usage.  Recommend minimum of 4 hours nightly utilization.   Follow up in 1 year

## 2022-10-28 ENCOUNTER — Ambulatory Visit: Payer: 59 | Admitting: Neurology

## 2022-12-07 NOTE — Telephone Encounter (Signed)
Care team updated and letter sent for eye exam notes.

## 2022-12-12 ENCOUNTER — Other Ambulatory Visit: Payer: Self-pay | Admitting: Cardiovascular Disease

## 2022-12-12 DIAGNOSIS — I480 Paroxysmal atrial fibrillation: Secondary | ICD-10-CM

## 2022-12-14 NOTE — Telephone Encounter (Signed)
Eliquis 5mg  refill request received. Patient is 62 years old, weight-93.9kg, Crea-1.0 on 09/28/22, Diagnosis-Afib, and last seen by Dr. Duke Salvia on 03/16/22. Dose is appropriate based on dosing criteria. Will send in refill to requested pharmacy.

## 2022-12-15 ENCOUNTER — Encounter: Payer: Self-pay | Admitting: Internal Medicine

## 2022-12-15 ENCOUNTER — Ambulatory Visit (INDEPENDENT_AMBULATORY_CARE_PROVIDER_SITE_OTHER): Payer: 59 | Admitting: Internal Medicine

## 2022-12-15 VITALS — BP 110/78 | HR 90 | Temp 98.8°F | Ht 61.0 in | Wt 208.8 lb

## 2022-12-15 DIAGNOSIS — I119 Hypertensive heart disease without heart failure: Secondary | ICD-10-CM

## 2022-12-15 DIAGNOSIS — Z0000 Encounter for general adult medical examination without abnormal findings: Secondary | ICD-10-CM | POA: Diagnosis not present

## 2022-12-15 DIAGNOSIS — I480 Paroxysmal atrial fibrillation: Secondary | ICD-10-CM

## 2022-12-15 DIAGNOSIS — E66812 Obesity, class 2: Secondary | ICD-10-CM

## 2022-12-15 DIAGNOSIS — E785 Hyperlipidemia, unspecified: Secondary | ICD-10-CM | POA: Diagnosis not present

## 2022-12-15 DIAGNOSIS — D6869 Other thrombophilia: Secondary | ICD-10-CM

## 2022-12-15 DIAGNOSIS — M79661 Pain in right lower leg: Secondary | ICD-10-CM

## 2022-12-15 DIAGNOSIS — E1169 Type 2 diabetes mellitus with other specified complication: Secondary | ICD-10-CM

## 2022-12-15 DIAGNOSIS — Z6839 Body mass index (BMI) 39.0-39.9, adult: Secondary | ICD-10-CM

## 2022-12-15 DIAGNOSIS — Z1211 Encounter for screening for malignant neoplasm of colon: Secondary | ICD-10-CM | POA: Insufficient documentation

## 2022-12-15 LAB — POCT URINALYSIS DIPSTICK
Bilirubin, UA: NEGATIVE
Glucose, UA: NEGATIVE
Ketones, UA: NEGATIVE
Leukocytes, UA: NEGATIVE
Nitrite, UA: NEGATIVE
Protein, UA: NEGATIVE
Spec Grav, UA: 1.02 (ref 1.010–1.025)
Urobilinogen, UA: 0.2 U/dL
pH, UA: 7 (ref 5.0–8.0)

## 2022-12-15 NOTE — Assessment & Plan Note (Signed)

## 2022-12-15 NOTE — Assessment & Plan Note (Signed)
Chronic, fair control. Goal BP<120/80.  She will continue with Cardizem CD 120mg  daily and olmesartan/amlodipine/hydrochlorothiazide 20/5/12.5mg  daily. She is encouraged to follow low sodium diet.

## 2022-12-15 NOTE — Assessment & Plan Note (Signed)
Chronic, LDL goal is less than 70.  Diabetic foot exam was performed.  She will continue with atorvastatin 80mg  daily. She has yet to start the 7.5mg  dosage.  She is reminded to be intentional regarding protein intake and to incorporate at least two days of strength training twice weekly. I DISCUSSED WITH THE PATIENT AT LENGTH REGARDING THE GOALS OF GLYCEMIC CONTROL AND POSSIBLE LONG-TERM COMPLICATIONS.  I  ALSO STRESSED THE IMPORTANCE OF COMPLIANCE WITH HOME GLUCOSE MONITORING, DIETARY RESTRICTIONS INCLUDING AVOIDANCE OF SUGARY DRINKS/PROCESSED FOODS,  ALONG WITH REGULAR EXERCISE.  I  ALSO STRESSED THE IMPORTANCE OF ANNUAL EYE EXAMS, SELF FOOT CARE AND COMPLIANCE WITH OFFICE VISITS.

## 2022-12-15 NOTE — Progress Notes (Unsigned)
I,[name], CMA,acting as a Neurosurgeon for [name], MD.,have documented all relevant documentation on the behalf of [name], MD,as directed by  [name], MD while in the presence of [name], MD.  Subjective:    Patient ID: [name] , female    DOB: 1960/07/28 , 62 y.o.   MRN: [id number]  Chief Complaint  Patient presents with   Annual Exam   Hypertension   Diabetes    HPI  She is here today for a full physical examination. She is followed by GYN for her pelvic examinations- [facility].  She denies having any headaches, chest pain and palpitations.  She will soon schedule her colonoscopy.        Hypertension This is a chronic problem. The current episode started more than 1 year ago. The problem has been gradually improving since onset. The problem is controlled. Pertinent negatives include no blurred vision, chest pain, headaches, palpitations or shortness of breath. Risk factors for coronary artery disease include obesity, post-menopausal state, sedentary lifestyle and stress. Past treatments include angiotensin blockers, diuretics and beta blockers. The current treatment provides moderate improvement.     Past Medical History:  Diagnosis Date   Allergy    Arthritis    back    Atrial fibrillation (HCC)    Atypical chest pain 06/30/2016   Back pain    DUE TO INJURY AT WORK ON05/2013   Bruises easily    Chronic lower back pain    Coronary artery disease    Headache(784.0)    rarely   History of bronchitis    last time about 78yrs ago    Hypertension    takes Benicar daily   OSA (obstructive sleep apnea) 04/28/2020   Preoperative evaluation of a medical condition to rule out surgical contraindications (TAR required) 09/12/2020   Pure hypercholesterolemia 05/31/2019   Stroke (HCC)    no residual   Vitamin D deficiency    takes Vitamin D 2 times/wk   Weakness    and numbness right foot     Family History  Problem  Relation Age of Onset   Heart disease Mother    CAD Mother    Stroke Mother    Heart disease Sister    Heart attack Sister    Liver disease Brother    Other Father        unknown medical history   CAD Brother    Colon cancer Neg Hx    Esophageal cancer Neg Hx    Rectal cancer Neg Hx    Stomach cancer Neg Hx      Current Outpatient Medications:    atorvastatin (LIPITOR) 80 MG tablet, TAKE 1 TABLET BY MOUTH DAILY AT 6 PM., Disp: 90 tablet, Rfl: 2   benzonatate (TESSALON PERLES) 100 MG capsule, Take 1 capsule (100 mg total) by mouth 3 (three) times daily as needed for cough., Disp: 30 capsule, Rfl: 1   cholecalciferol (VITAMIN D3) 25 MCG (1000 UNIT) tablet, Take 1,000 Units by mouth daily. , Disp: , Rfl:    diltiazem (CARDIZEM CD) 120 MG 24 hr capsule, TAKE 1 CAPSULE BY MOUTH EVERY DAY, Disp: 90 capsule, Rfl: 2   ELIQUIS 5 MG TABS tablet, TAKE 1 TABLET BY MOUTH TWICE A DAY, Disp: 60 tablet, Rfl: 5   ezetimibe (ZETIA) 10 MG tablet, TAKE 1 TABLET BY MOUTH EVERY DAY, Disp: 30 tablet, Rfl: 8   fluticasone (FLONASE) 50 MCG/ACT nasal spray, PLACE 2  SPRAYS IN BOTH NOSTRILS DAILY AS NEEDED FOR ALLERGIES OR RHINITIS, Disp: 16 mL, Rfl: 8   levocetirizine (XYZAL) 5 MG tablet, Take 1 tablet (5 mg total) by mouth daily., Disp: 90 tablet, Rfl: 2   Magnesium 250 MG TABS, TAKE 1 TABLET BY MOUTH DAILY WITH EVENING MEALS, Disp: 90 tablet, Rfl: 2   Olmesartan-amLODIPine-HCTZ 20-5-12.5 MG TABS, Take 1 tablet by mouth daily., Disp: 90 tablet, Rfl: 2   ticagrelor (BRILINTA) 90 MG TABS tablet, Take 1 tablet (90 mg total) by mouth 2 (two) times daily., Disp: 60 tablet, Rfl: 3   tirzepatide (MOUNJARO) 7.5 MG/0.5ML Pen, Inject 7.5 mg into the skin once a week., Disp: 2 mL, Rfl: 1   topiramate (TOPAMAX) 100 MG tablet, Take 1 tablet (100 mg total) by mouth at bedtime., Disp: 90 tablet, Rfl: 4   traMADol (ULTRAM) 50 MG tablet, Take 1 tablet (50 mg total) by mouth daily as needed., Disp: 30 tablet, Rfl: 0    Allergies  Allergen Reactions   Other Itching and Other (See Comments)    Patient experienced intense itching in her arm that was accessed for a past angiogram      The patient states she uses {contraceptive methods:5051} for birth control. Patient's last menstrual period was 06/03/2011.. {Dysmenorrhea-menorrhagia:21918}. Negative for: breast discharge, breast lump(s), breast pain and breast self exam. Associated symptoms include abnormal vaginal bleeding. Pertinent negatives include abnormal bleeding (hematology), anxiety, decreased libido, depression, difficulty falling sleep, dyspareunia, history of infertility, nocturia, sexual dysfunction, sleep disturbances, urinary incontinence, urinary urgency, vaginal discharge and vaginal itching. Diet regular.The patient states her exercise level is    . The patient's tobacco use is:  Social History   Tobacco Use  Smoking Status Never  Smokeless Tobacco Never  . She has been exposed to passive smoke. The patient's alcohol use is:  Social History   Substance and Sexual Activity  Alcohol Use Not Currently   Alcohol/week: 2.0 standard drinks of alcohol   Types: 2 Glasses of wine per week   Comment: social- 2 wine or 2 beers  . Additional information: Last pap ***, next one scheduled for ***.    Review of Systems  Constitutional: Negative.   HENT: Negative.    Eyes: Negative.  Negative for blurred vision.  Respiratory: Negative.  Negative for shortness of breath.   Cardiovascular: Negative.  Negative for chest pain and palpitations.  Gastrointestinal: Negative.   Endocrine: Negative.   Genitourinary: Negative.   Musculoskeletal: Negative.   Skin: Negative.   Allergic/Immunologic: Negative.   Neurological: Negative.  Negative for headaches.  Hematological: Negative.   Psychiatric/Behavioral: Negative.       Today's Vitals   12/15/22 1003  BP: 110/78  Pulse: 90  Temp: 98.8 F (37.1 C)  SpO2: 98%  Weight: 208 lb 12.8 oz (94.7  kg)  Height: 5\' 1"  (1.549 m)   Body mass index is 39.45 kg/m.  Wt Readings from Last 3 Encounters:  12/15/22 208 lb 12.8 oz (94.7 kg)  10/25/22 207 lb (93.9 kg)  09/28/22 201 lb 3.2 oz (91.3 kg)     Objective:  Physical Exam      Assessment And Plan:     Annual physical exam Assessment & Plan: A full exam was performed.  Importance of monthly self breast exams was discussed with the patient.  She is advised to get 30-45 minutes of regular exercise, no less than four to five days per week. Both weight-bearing and aerobic exercises are recommended.  She is advised  to follow a healthy diet with at least six fruits/veggies per day, decrease intake of red meat and other saturated fats and to increase fish intake to twice weekly.  Meats/fish should not be fried -- baked, boiled or broiled is preferable. It is also important to cut back on your sugar intake.  Be sure to read labels - try to avoid anything with added sugar, high fructose corn syrup or other sweeteners.  If you must use a sweetener, you can try stevia or monkfruit.  It is also important to avoid artificially sweetened foods/beverages and diet drinks. Lastly, wear SPF 50 sunscreen on exposed skin and when in direct sunlight for an extended period of time.  Be sure to avoid fast food restaurants and aim for at least 60 ounces of water daily.      Orders: -     CBC -     CMP14+EGFR -     Lipid panel -     Hemoglobin A1c -     Ambulatory referral to Gastroenterology  Hypertensive heart disease without heart failure -     EKG 12-Lead -     Microalbumin / creatinine urine ratio -     POCT urinalysis dipstick  Dyslipidemia associated with type 2 diabetes mellitus (HCC) Assessment & Plan: Chronic, LDL goal is less than 70.  Diabetic foot exam was performed.  She will continue with atorvastatin 80mg  daily. She has yet to start the 7.5mg  dosage.  She is reminded to be intentional regarding protein intake and to incorporate at least  two days of strength training twice weekly. I DISCUSSED WITH THE PATIENT AT LENGTH REGARDING THE GOALS OF GLYCEMIC CONTROL AND POSSIBLE LONG-TERM COMPLICATIONS.  I  ALSO STRESSED THE IMPORTANCE OF COMPLIANCE WITH HOME GLUCOSE MONITORING, DIETARY RESTRICTIONS INCLUDING AVOIDANCE OF SUGARY DRINKS/PROCESSED FOODS,  ALONG WITH REGULAR EXERCISE.  I  ALSO STRESSED THE IMPORTANCE OF ANNUAL EYE EXAMS, SELF FOOT CARE AND COMPLIANCE WITH OFFICE VISITS.    Paroxysmal atrial fibrillation (HCC)  Acquired thrombophilia (HCC) Assessment & Plan: Chronic, she will continue with Eliquis for underlying PAF.    Class 2 severe obesity due to excess calories with serious comorbidity and body mass index (BMI) of 39.0 to 39.9 in adult [facility])  Colon cancer screening -     Ambulatory referral to Gastroenterology  Right calf pain -     CK -     VITAMIN D 25 Hydroxy (Vit-D Deficiency, Fractures)  She is encouraged to strive for BMI less than 30 to decrease cardiac risk. Advised to aim for at least 150 minutes of exercise per week.    Return for 1 YEAR HM, 4 MONTH BP & DM. Patient was given opportunity to ask questions. Patient verbalized understanding of the plan and was able to repeat key elements of the plan. All questions were answered to their satisfaction.   I, [name], MD, have reviewed all documentation for this visit. The documentation on 12/15/22 for the exam, diagnosis, procedures, and orders are all accurate and complete.

## 2022-12-15 NOTE — Assessment & Plan Note (Signed)
Chronic, she will continue with Eliquis for underlying PAF.

## 2022-12-15 NOTE — Patient Instructions (Signed)

## 2022-12-16 DIAGNOSIS — M79661 Pain in right lower leg: Secondary | ICD-10-CM | POA: Insufficient documentation

## 2022-12-16 NOTE — Assessment & Plan Note (Signed)
She is encouraged to strive for BMI less than 30 to decrease cardiac risk. Advised to aim for at least 150 minutes of exercise/week.

## 2022-12-16 NOTE — Assessment & Plan Note (Signed)
Chronic, currently in sinus rhythm. She is on Eliquis for anticoagulation and diltiazem for rate control.

## 2022-12-17 LAB — CMP14+EGFR
ALT: 25 [IU]/L (ref 0–32)
AST: 22 [IU]/L (ref 0–40)
Albumin: 4.6 g/dL (ref 3.9–4.9)
Alkaline Phosphatase: 86 [IU]/L (ref 44–121)
BUN/Creatinine Ratio: 13 (ref 12–28)
BUN: 12 mg/dL (ref 8–27)
Bilirubin Total: 0.4 mg/dL (ref 0.0–1.2)
CO2: 23 mmol/L (ref 20–29)
Calcium: 9.4 mg/dL (ref 8.7–10.3)
Chloride: 107 mmol/L — ABNORMAL HIGH (ref 96–106)
Creatinine, Ser: 0.91 mg/dL (ref 0.57–1.00)
Globulin, Total: 3.2 g/dL (ref 1.5–4.5)
Glucose: 104 mg/dL — ABNORMAL HIGH (ref 70–99)
Potassium: 4.4 mmol/L (ref 3.5–5.2)
Sodium: 144 mmol/L (ref 134–144)
Total Protein: 7.8 g/dL (ref 6.0–8.5)
eGFR: 71 mL/min/{1.73_m2} (ref >59)

## 2022-12-17 LAB — LIPID PANEL
Chol/HDL Ratio: 2.3 {ratio} (ref 0.0–4.4)
Cholesterol, Total: 119 mg/dL (ref 100–199)
HDL: 51 mg/dL (ref >39)
LDL Chol Calc (NIH): 56 mg/dL (ref 0–99)
Triglycerides: 54 mg/dL (ref 0–149)
VLDL Cholesterol Cal: 12 mg/dL (ref 5–40)

## 2022-12-17 LAB — CBC
Hematocrit: 37.8 % (ref 34.0–46.6)
Hemoglobin: 12.3 g/dL (ref 11.1–15.9)
MCH: 29.1 pg (ref 26.6–33.0)
MCHC: 32.5 g/dL (ref 31.5–35.7)
MCV: 90 fL (ref 79–97)
Platelets: 335 10*3/uL (ref 150–450)
RBC: 4.22 x10E6/uL (ref 3.77–5.28)
RDW: 13.6 % (ref 11.7–15.4)
WBC: 6.5 10*3/uL (ref 3.4–10.8)

## 2022-12-17 LAB — VITAMIN D 25 HYDROXY (VIT D DEFICIENCY, FRACTURES): Vit D, 25-Hydroxy: 47.7 ng/mL (ref 30.0–100.0)

## 2022-12-17 LAB — HEMOGLOBIN A1C
Est. average glucose Bld gHb Est-mCnc: 140 mg/dL
Hgb A1c MFr Bld: 6.5 % — ABNORMAL HIGH (ref 4.8–5.6)

## 2022-12-17 LAB — MICROALBUMIN / CREATININE URINE RATIO
Creatinine, Urine: 57.5 mg/dL
Microalb/Creat Ratio: 17 mg/g{creat} (ref 0–29)
Microalbumin, Urine: 10 ug/mL

## 2022-12-17 LAB — CK: Total CK: 294 U/L — ABNORMAL HIGH (ref 32–182)

## 2023-01-13 ENCOUNTER — Encounter: Payer: Self-pay | Admitting: Physician Assistant

## 2023-01-17 ENCOUNTER — Other Ambulatory Visit: Payer: Self-pay | Admitting: Internal Medicine

## 2023-01-17 DIAGNOSIS — I119 Hypertensive heart disease without heart failure: Secondary | ICD-10-CM

## 2023-01-31 ENCOUNTER — Telehealth: Payer: Self-pay | Admitting: Orthopaedic Surgery

## 2023-01-31 ENCOUNTER — Other Ambulatory Visit: Payer: Self-pay

## 2023-01-31 MED ORDER — AMOXICILLIN 500 MG PO TABS: ORAL_TABLET | ORAL | 2 refills | Status: DC

## 2023-01-31 NOTE — Telephone Encounter (Signed)
Notified patient.

## 2023-01-31 NOTE — Telephone Encounter (Signed)
Patient states she is scheduled for dental work on 02/02/23 @ 8:20am and she needs pre meds.   Pharmacy [facility] on [address]

## 2023-02-20 ENCOUNTER — Other Ambulatory Visit: Payer: Self-pay | Admitting: Internal Medicine

## 2023-02-20 DIAGNOSIS — E1169 Type 2 diabetes mellitus with other specified complication: Secondary | ICD-10-CM

## 2023-02-21 ENCOUNTER — Other Ambulatory Visit: Payer: Self-pay | Admitting: Internal Medicine

## 2023-03-02 ENCOUNTER — Ambulatory Visit: Payer: 59 | Admitting: Physician Assistant

## 2023-03-07 ENCOUNTER — Telehealth (HOSPITAL_COMMUNITY): Payer: Self-pay

## 2023-03-07 NOTE — Telephone Encounter (Signed)
 Pt called for a Brilinta refill. Message sent to PA to call in. AB

## 2023-03-08 ENCOUNTER — Other Ambulatory Visit: Payer: Self-pay | Admitting: Physician Assistant

## 2023-03-08 MED ORDER — TICAGRELOR 90 MG PO TABS: 90.0000 mg | ORAL_TABLET | Freq: Two times a day (BID) | ORAL | 3 refills | Status: DC

## 2023-04-14 ENCOUNTER — Ambulatory Visit: Payer: 59 | Admitting: Gastroenterology

## 2023-04-14 ENCOUNTER — Telehealth: Payer: Self-pay

## 2023-04-14 ENCOUNTER — Encounter: Payer: Self-pay | Admitting: Gastroenterology

## 2023-04-14 VITALS — BP 128/60 | HR 76 | Ht 61.0 in | Wt 193.6 lb

## 2023-04-14 DIAGNOSIS — Z7902 Long term (current) use of antithrombotics/antiplatelets: Secondary | ICD-10-CM | POA: Insufficient documentation

## 2023-04-14 DIAGNOSIS — Z1211 Encounter for screening for malignant neoplasm of colon: Secondary | ICD-10-CM

## 2023-04-14 DIAGNOSIS — Z7901 Long term (current) use of anticoagulants: Secondary | ICD-10-CM | POA: Diagnosis not present

## 2023-04-14 DIAGNOSIS — I4891 Unspecified atrial fibrillation: Secondary | ICD-10-CM

## 2023-04-14 DIAGNOSIS — Z8673 Personal history of transient ischemic attack (TIA), and cerebral infarction without residual deficits: Secondary | ICD-10-CM

## 2023-04-14 MED ORDER — NA SULFATE-K SULFATE-MG SULF 17.5-3.13-1.6 GM/177ML PO SOLN: 1.0000 | Freq: Once | ORAL | 0 refills | Status: AC

## 2023-04-14 NOTE — Patient Instructions (Addendum)
 You have been scheduled for a colonoscopy. Please follow written instructions given to you at your visit today.   If you use inhalers (even only as needed), please bring them with you on the day of your procedure.  DO NOT TAKE 7 DAYS PRIOR TO TEST- Trulicity (dulaglutide) Ozempic, Wegovy (semaglutide) Mounjaro (tirzepatide) Bydureon Bcise (exanatide extended release)  DO NOT TAKE 1 DAY PRIOR TO YOUR TEST Rybelsus (semaglutide) Adlyxin (lixisenatide) Victoza (liraglutide) Byetta (exanatide) ____________________________________________________________________________________  If your blood pressure at your visit was 140/90 or greater, please contact your primary care physician to follow up on this.  _______________________________________________________  If you are age 67 or older, your body mass index should be between 23-30. Your Body mass index is 36.58 kg/m. If this is out of the aforementioned range listed, please consider follow up with your Primary Care Provider.  If you are age 25 or younger, your body mass index should be between 19-25. Your Body mass index is 36.58 kg/m. If this is out of the aformentioned range listed, please consider follow up with your Primary Care Provider.   ________________________________________________________  The [facility] providers would like to encourage you to use [facility] to communicate with providers for non-urgent requests or questions.  Due to long hold times on the telephone, sending your provider a message by [facility] may be a faster and more efficient way to get a response.  Please allow 48 business hours for a response.  Please remember that this is for non-urgent requests.  _______________________________________________________

## 2023-04-14 NOTE — Telephone Encounter (Signed)
 [facility] Pre-operative Risk Assessment     Request for surgical clearance:     Endoscopy Procedure  What type of surgery is being performed?     Colonoscopy  When is this surgery scheduled?     05/18/23  What type of clearance is required ?   Pharmacy  Are there any medications that need to be held prior to surgery and how long? Eliquis x2 days  Practice name and name of physician performing surgery?      [facility]  What is your office phone and fax number?      Phone- [phone]  Fax- [phone]  Anesthesia type (None, local, MAC, general) ?       MAC   Please route your response to Charlies Silvers, CMA

## 2023-04-14 NOTE — Progress Notes (Signed)
 04/14/2023 [name] [id number] 05/02/1960   HISTORY OF PRESENT ILLNESS:  This is a 63 year old female who was previously a patient of Dr. Ardell Isaacs.  She is here today at the request of her PCP, Dr. Allyne Gee, in order discuss colonoscopy.  Her last colonoscopy was in June 2014 and was normal.  Her care will be reassigned to Dr. Lavon Paganini.  She says that she is doing well from a GI standpoint, no complaints.  Moves her bowels well and no rectal bleeding.  She is on Eliquis for atrial fibrillation and Brilinta for history of CVA.  She follows with cardiology and neurology with no new issues.  She has held these in the past for hip replacement without issues.   Past Medical History:  Diagnosis Date   Allergy    Arthritis    back    Atrial fibrillation (HCC)    Atypical chest pain 06/30/2016   Back pain    DUE TO INJURY AT WORK ON05/2013   Bruises easily    Chronic lower back pain    Coronary artery disease    Headache(784.0)    rarely   History of bronchitis    last time about 52yrs ago    Hypertension    takes Benicar daily   OSA (obstructive sleep apnea) 04/28/2020   Preoperative evaluation of a medical condition to rule out surgical contraindications (TAR required) 09/12/2020   Pure hypercholesterolemia 05/31/2019   Stroke (HCC)    no residual   Vitamin D deficiency    takes Vitamin D 2 times/wk   Weakness    and numbness right foot   Past Surgical History:  Procedure Laterality Date   BACK SURGERY     CARPAL TUNNEL RELEASE Right    CESAREAN SECTION  1988   COLONOSCOPY     EXPLORATORY LAPAROTOMY  1991   "took out fatty tumor" (11/09/2012)   IR ANGIO INTRA EXTRACRAN SEL COM CAROTID INNOMINATE BILAT MOD SED  08/10/2019   IR ANGIO INTRA EXTRACRAN SEL COM CAROTID INNOMINATE BILAT MOD SED  10/07/2020   IR ANGIO INTRA EXTRACRAN SEL COM CAROTID INNOMINATE UNI L MOD SED  11/05/2020   IR ANGIO VERTEBRAL SEL SUBCLAVIAN INNOMINATE BILAT MOD SED  10/07/2020   IR ANGIO  VERTEBRAL SEL VERTEBRAL BILAT MOD SED  08/10/2019   IR CT HEAD LTD  11/05/2020   IR INTRAVSC STENT CERV CAROTID W/EMB-PROT MOD SED INCL ANGIO  11/05/2020   IR RADIOLOGIST EVAL & MGMT  11/21/2020   IR US GUIDE VASC ACCESS RIGHT  08/10/2019   IR US GUIDE VASC ACCESS RIGHT  10/07/2020   LEFT HEART CATH AND CORONARY ANGIOGRAPHY N/A 08/06/2016   Procedure: Left Heart Cath and Coronary Angiography;  Surgeon: Lyn Records, MD;  Location: [facility] INVASIVE CV LAB;  Service: Cardiovascular;  Laterality: N/A;   LEFT HEART CATH AND CORONARY ANGIOGRAPHY N/A 07/12/2018   Procedure: LEFT HEART CATH AND CORONARY ANGIOGRAPHY;  Surgeon: Marykay Lex, MD;  Location: [facility] INVASIVE CV LAB;  Service: Cardiovascular;  Laterality: N/A;   LUMBAR LAMINECTOMY/DECOMPRESSION MICRODISCECTOMY  11/09/2012   "L3-4" (11/09/2012)   LUMBAR LAMINECTOMY/DECOMPRESSION MICRODISCECTOMY N/A 11/09/2012   Procedure: L3-L4 DECOMPRESSION AND MICRODISCECTOMY   (1 LEVEL);  Surgeon: Venita Lick, MD;  Location: [facility] OR;  Service: Orthopedics;  Laterality: N/A;   RADIOLOGY WITH ANESTHESIA N/A 11/05/2020   Procedure: STENTING;  Surgeon: Julieanne Cotton, MD;  Location: [facility] OR;  Service: Radiology;  Laterality: N/A;   TOTAL HIP ARTHROPLASTY  Left 05/11/2021   Procedure: LEFT TOTAL HIP ARTHROPLASTY ANTERIOR APPROACH;  Surgeon: Tarry Kos, MD;  Location: [facility] OR;  Service: Orthopedics;  Laterality: Left;  3-C    reports that she has never smoked. She has never used smokeless tobacco. She reports that she does not currently use alcohol after a past usage of about 2.0 standard drinks of alcohol per week. She reports that she does not use drugs. family history includes CAD in her brother and mother; Heart attack in her sister; Heart disease in her mother and sister; Liver disease in her brother; Other in her father; Stroke in her mother. Allergies  Allergen Reactions   Other Itching and Other (See Comments)    Patient experienced intense itching in her arm  that was accessed for a past angiogram      Outpatient Encounter Medications as of 04/14/2023  Medication Sig   amoxicillin (AMOXIL) 500 MG tablet Take 4 tablets by mouth 1 hour prior to dental procedure. (Patient not taking: Reported on 04/14/2023)   atorvastatin (LIPITOR) 80 MG tablet TAKE 1 TABLET BY MOUTH DAILY AT 6 PM.   benzonatate (TESSALON PERLES) 100 MG capsule Take 1 capsule (100 mg total) by mouth 3 (three) times daily as needed for cough.   cholecalciferol (VITAMIN D3) 25 MCG (1000 UNIT) tablet Take 1,000 Units by mouth daily.    diltiazem (CARDIZEM CD) 120 MG 24 hr capsule TAKE 1 CAPSULE BY MOUTH EVERY DAY   ELIQUIS 5 MG TABS tablet TAKE 1 TABLET BY MOUTH TWICE A DAY   ezetimibe (ZETIA) 10 MG tablet TAKE 1 TABLET BY MOUTH EVERY DAY   fluticasone (FLONASE) 50 MCG/ACT nasal spray PLACE 2 SPRAYS IN BOTH NOSTRILS DAILY AS NEEDED FOR ALLERGIES OR RHINITIS   levocetirizine (XYZAL) 5 MG tablet Take 1 tablet (5 mg total) by mouth daily.   Magnesium 250 MG TABS TAKE 1 TABLET BY MOUTH DAILY WITH EVENING MEALS   Olmesartan-amLODIPine-HCTZ 20-5-12.5 MG TABS TAKE 1 TABLET BY MOUTH EVERY DAY   ticagrelor (BRILINTA) 90 MG TABS tablet Take 1 tablet (90 mg total) by mouth 2 (two) times daily.   ticagrelor (BRILINTA) 90 MG TABS tablet Take 1 tablet (90 mg total) by mouth 2 (two) times daily.   tirzepatide (MOUNJARO) 7.5 MG/0.5ML Pen Inject 7.5 mg into the skin once a week.   topiramate (TOPAMAX) 100 MG tablet Take 1 tablet (100 mg total) by mouth at bedtime.   traMADol (ULTRAM) 50 MG tablet Take 1 tablet (50 mg total) by mouth daily as needed.   No facility-administered encounter medications on file as of 04/14/2023.    REVIEW OF SYSTEMS  : All other systems reviewed and negative except where noted in the History of Present Illness.   PHYSICAL EXAM: BP 128/60 (Cuff Size: Normal)   Pulse 76   Ht 5\' 1"  (1.549 m)   Wt 193 lb 9.6 oz (87.8 kg)   LMP 06/03/2011   BMI 36.58 kg/m  General: Well  developed female in no acute distress Head: Normocephalic and atraumatic Eyes:  Sclerae anicteric, conjunctiva pink. Ears: Normal auditory acuity Lungs: Clear throughout to auscultation; no W/R/R. Heart: Regular rate and rhythm; no M/R/G. Rectal:  Will be done at the time of colonoscopy. Musculoskeletal: Symmetrical with no gross deformities  Skin: No lesions on visible extremities Neurological: Alert oriented x 4, grossly non-focal Psychological:  Alert and cooperative. Normal mood and affect  ASSESSMENT AND PLAN: *Colorectal cancer screening: Last colonoscopy June 2014 was normal.  Will schedule  with Dr. Lavon Paganini.  The risks, benefits, and alternatives to colonoscopy were discussed with the patient and she consents to proceed.  *Chronic anticoagulation with Eliquis and antiplatelet use with Brilinta for atrial fibrillation and history of CVA/bilateral carotid artery stenosis.  Will need to reach out to her prescribing physicians in regards to holding Eliquis for 2 days and Brilinta for 5 days.  CC:  Dorothyann Peng, MD

## 2023-04-14 NOTE — Telephone Encounter (Signed)
  DMYA LONG 12-Dec-1960 [id number]  @DATE @   Dear Macy Mis  We have scheduled the above named patient for a(n) Colonoscopy procedure. Our records show that (s)he is on anticoagulation therapy.  Please advise as to whether the patient may come off their therapy of Brilinta 5 days prior to their procedure which is scheduled for 05/18/23.  Please route your response to Charlies Silvers, CMA or fax response to [phone].  Sincerely,   Charlies Silvers, [facility]

## 2023-04-14 NOTE — Telephone Encounter (Signed)
 Pt has been scheduled to see Eligha Bridegroom, NP 04/18/23 for preop clearance.

## 2023-04-14 NOTE — Telephone Encounter (Signed)
 Dr. Lavon Paganini will be performing the procedure.

## 2023-04-14 NOTE — Telephone Encounter (Signed)
   Name: [name]  DOB: 20-Aug-1960  MRN: [id number]  Primary Cardiologist: Chilton Si, MD  Chart reviewed as part of pre-operative protocol coverage. Because of [name]'s past medical history and time since last visit, she will require a follow-up in-office visit in order to better assess preoperative cardiovascular risk.  Pt hasn't been seen in the last year. Have also routed to pharmD for eliquis hold.  Pre-op covering staff: - Please schedule appointment and call patient to inform them. If patient already had an upcoming appointment within acceptable timeframe, please add "pre-op clearance" to the appointment notes so provider is aware. - Please contact requesting surgeon's office via preferred method (i.e, phone, fax) to inform them of need for appointment prior to surgery.    Roe Rutherford Scout Gumbs, PA  04/14/2023, 10:41 AM

## 2023-04-15 ENCOUNTER — Telehealth: Payer: Self-pay

## 2023-04-15 NOTE — Telephone Encounter (Signed)
  [name] July 05, 1960 [id number]  @DATE @   Dear [name]:  We have scheduled the above named patient for a(n) Colonoscopy  procedure. Our records show that (s)he is on anticoagulation therapy.  Please advise as to whether the patient may come off their therapy of Brilinta 5 days prior to their procedure which is scheduled for 05/18/23.  Please route your response to Charlies Silvers, CMA or fax response to [phone].  Sincerely,   Charlies Silvers, [facility]

## 2023-04-15 NOTE — Telephone Encounter (Signed)
 Patient with diagnosis of atrial fibrillation on Eliquis for anticoagulation.    What type of surgery is being performed?     Colonoscopy  When is this surgery scheduled?     05/18/23    CHA2DS2-VASc Score = 6   This indicates a 9.7% annual risk of stroke. The patient's score is based upon: CHF History: 0 HTN History: 1 Diabetes History: 1 Stroke History: 2 Vascular Disease History: 1 Age Score: 0 Gender Score: 1   Stroke 2020, no residual issues  CrCl 88 Platelet count 335  Per office protocol, patient can hold Eliquis for 2 days prior to procedure.   Patient will not need bridging with Lovenox (enoxaparin) around procedure.  **This guidance is not considered finalized until pre-operative APP has relayed final recommendations.**

## 2023-04-15 NOTE — Telephone Encounter (Signed)
 [name], can you please inform patient to hold Eliquis 2 days prior to colonoscopy.  Thank you

## 2023-04-17 NOTE — Progress Notes (Unsigned)
 Cardiology Office Note:  .   Date:  04/18/2023  ID:  [name], DOB Jun 20, 1960, MRN [id number] PCP: Dorothyann Peng, MD  [facility] Providers Cardiologist:  Chilton Si, MD    Patient Profile: .      PMH Coronary artery disease LHC 08/06/2016  Normal coronary arteries LHC 07/13/2018 Mix Cx lesion 85% 30-40% stenosed side branch 2nd Mrg PCI/DES (3 x 20 Synergy) to left Cx 2020 Family history early CAD PAF Hypertension Carotid artery stenosis Stroke  Initially seen 06/2016 for abnormal EKG.  Her sister died of a heart attack at age 84 without any preceding symptoms.  She also has family history of long QT syndrome with her mother, aunt, and 2 cousins all having the disease.  She reported intermittent episodes of atypical chest pain and was referred for ETT 07/22/2016 that showed ST depressions.  She achieved 10.1 METS on the Bruce protocol.  She underwent left heart catheterization which showed normal coronary arteries.  Echo revealed LVEF 65 to 70%, G2 DD, mildly elevated pulmonary pressures (PASP 48 mmHg).  She presented to the hospital with chest pain 07/2018.  She underwent LHC which showed 85% mid left circumflex disease with 30 to 40% stenoses in OM 2.  She underwent PCI of the left circumflex.  Left ventriculography revealed LVEF 55 to 65%.  BP was low at follow-up visit and olmesartan/hydrochlorothiazide was reduced.  She switched to taking it twice daily.  She presented to the hospital 07/2019 with subdural hemorrhage on aspirin and Plavix.  She had a carotid angiogram that was 80 to 99% occluded on the right and 30% on left.  She was referred to vein and vascular for follow-up.  CTA of the neck 02/2020 showed right ICA was 100% occluded and 60 to 70% left ICA stenosis.  Medical management was recommended.  She was seen in the hospital 03/2020 with chest pain and found to be in atrial fibrillation with RVR.  She was anticoagulated with Eliquis.  At follow-up 09/2020 she was  in sinus rhythm and doing well.  She underwent left hip replacement 05/2021.  Given her stroke history it was recommended that anticoagulation be bridged.  Last cardiology clinic visit was 03/16/2022 with Dr. Duke Salvia.  She was walking 2-3 times a week, about 1 mile on Saturdays and shorter distances 2 to 3 days a week.  She was no longer having chest tightness with walking.  She had numbness in LLE since hip replacement.  She had started on [employer] and was achieving weight loss.  She had a brother who had a stroke despite healthy lifestyle.       History of Present Illness: .   [name] is a very pleasant 63 y.o. female who is here today for follow-up of PAF. She reports feeling well overall, but is having decreased appetite on the recently increased dose of Mounjaro which concerns her. 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 She also walks for exercise and spends time with her grandchildren. She denies chest pain, shortness of breath, palpitations, orthopnea, PND, edema, lightheadedness, presyncope and syncope. She has been followed by interventional radiology for carotid artery disease with the most recent scan in September showing recanalization of right ICA, previously totally occluded. Home SBP has been stable with readings mostly in the 120s. She reports feeling full after only a few bites of food, and has been unable  to finish meals. She has been trying to eat more vegetables and lean proteins, but has found that she does not have a taste for proteins anymore. Occasional bruising, secondary to Brilinta and Eliquis but no significant bleeding problems.   Discussed the use of AI scribe software for clinical note transcription with the patient, who gave verbal consent to proceed.   ROS: See HPI       Studies Reviewed: Marland Kitchen   EKG Interpretation Date/Time:  Monday April 18 2023 09:04:13  EDT Ventricular Rate:  92 PR Interval:  126 QRS Duration:  84 QT Interval:  356 QTC Calculation: 440 R Axis:   13  Text Interpretation: Normal sinus rhythm Normal ECG When compared with ECG of 06-Nov-2020 09:16, Sinus rhythm has replaced Atrial fibrillation Nonspecific T wave abnormality, improved in Lateral leads Confirmed by Eligha Bridegroom [phone]) on 04/18/2023 9:16:28 AM    No results found for: "LIPOA"   Risk Assessment/Calculations:    CHA2DS2-VASc Score = 6   This indicates a 9.7% annual risk of stroke. The patient's score is based upon: CHF History: 0 HTN History: 1 Diabetes History: 1 Stroke History: 2 Vascular Disease History: 1 Age Score: 0 Gender Score: 1            Physical Exam:   VS:  BP 128/72 (BP Location: Left Arm, Patient Position: Sitting, Cuff Size: Large)   Pulse 92   Ht 5\' 1"  (1.549 m)   Wt 195 lb (88.5 kg)   LMP 06/03/2011   SpO2 96%   BMI 36.84 kg/m    Wt Readings from Last 3 Encounters:  04/18/23 195 lb (88.5 kg)  04/14/23 193 lb 9.6 oz (87.8 kg)  12/15/22 208 lb 12.8 oz (94.7 kg)    GEN: Obese, well developed in no acute distress NECK: No JVD; No carotid bruits CARDIAC: RRR, no murmurs, rubs, gallops RESPIRATORY:  Clear to auscultation without rales, wheezing or rhonchi  ABDOMEN: Soft, non-tender, non-distended EXTREMITIES:  No edema; No deformity     ASSESSMENT AND PLAN: .    PAF on chronic anticoagulation: EKG today reveals sinus rhythm at 92 bpm, nonspecific T wave abnormality.  She denies tachy palpitations.  No return of A-fib to her awareness.  No bleeding concerns.  Continue Eliquis 5 mg twice daily which is appropriate dose for stroke prevention for CHA2DS2-VASc score of 6.  HR is well-controlled.  Continue diltiazem for rate control.  CAD without angina: History of PCI/DES to LCx in 2020, residual 30-40% stenosis 2nd marginal at time of cath 07/2018. She remains active with walking and denies chest pain, dyspnea, or other  symptoms concerning for angina.  No indication for further ischemic evaluation at this time. Focus on secondary prevention including heart healthy mostly plant based diet avoiding saturated fat, processed foods, simple carbohydrates, and sugar along with aiming for at least 150 minutes of moderate intensity exercise each week. Encouraged her to add weight lifting to diminish loss of muscle mass on GLP-1 as well as focusing on heart healthy foods in small portions with decreased appetite.   Hypertension: BP is well controlled. Stable renal function on labs completed 12/15/2022. She is seeing PCP later this week. No medication changes today.   Carotid artery stenosis s/p stroke: Carotid duplex 09/08/2022 with 50 to 75% stenosis left ICA, essentially unchanged from previous imaging, appearance of recannulation of previously occluded right ICA.  No concerning symptoms of TIA/stroke.  Management per interventional radiology.       Disposition:1 year with  Dr. Duke Salvia or APP  Signed, Eligha Bridegroom, NP-C

## 2023-04-18 ENCOUNTER — Encounter (HOSPITAL_BASED_OUTPATIENT_CLINIC_OR_DEPARTMENT_OTHER): Payer: Self-pay | Admitting: Nurse Practitioner

## 2023-04-18 ENCOUNTER — Ambulatory Visit (HOSPITAL_BASED_OUTPATIENT_CLINIC_OR_DEPARTMENT_OTHER): Admitting: Nurse Practitioner

## 2023-04-18 VITALS — BP 128/72 | HR 92 | Ht 61.0 in | Wt 195.0 lb

## 2023-04-18 DIAGNOSIS — I639 Cerebral infarction, unspecified: Secondary | ICD-10-CM | POA: Diagnosis not present

## 2023-04-18 DIAGNOSIS — I480 Paroxysmal atrial fibrillation: Secondary | ICD-10-CM

## 2023-04-18 DIAGNOSIS — I2510 Atherosclerotic heart disease of native coronary artery without angina pectoris: Secondary | ICD-10-CM

## 2023-04-18 DIAGNOSIS — I10 Essential (primary) hypertension: Secondary | ICD-10-CM

## 2023-04-18 DIAGNOSIS — I6523 Occlusion and stenosis of bilateral carotid arteries: Secondary | ICD-10-CM

## 2023-04-18 NOTE — Telephone Encounter (Signed)
 Dr Lavon Paganini please review it seems the patient is on Brilinta as well

## 2023-04-18 NOTE — Patient Instructions (Signed)
 Medication Instructions:  Your physician recommends that you continue on your current medications as directed. Please refer to the Current Medication list given to you today.   *If you need a refill on your cardiac medications before your next appointment, please call your pharmacy*  Lab Work: NONE  Testing/Procedures: NONE  Follow-Up: At [facility], you and your health needs are our priority.  As part of our continuing mission to provide you with exceptional heart care, our providers are all part of one team.  This team includes your primary Cardiologist (physician) and Advanced Practice Providers or APPs (Physician Assistants and Nurse Practitioners) who all work together to provide you with the care you need, when you need it.  Your next appointment:   12 month(s)  Provider:   Chilton Si, MD, Eligha Bridegroom, NP, or Gillian Shields, NP    We recommend signing up for the patient portal called "MyChart".  Sign up information is provided on this After Visit Summary.  MyChart is used to connect with patients for Virtual Visits (Telemedicine).  Patients are able to view lab/test results, encounter notes, upcoming appointments, etc.  Non-urgent messages can be sent to your provider as well.   To learn more about what you can do with MyChart, go to [email or web address].

## 2023-04-19 ENCOUNTER — Ambulatory Visit (INDEPENDENT_AMBULATORY_CARE_PROVIDER_SITE_OTHER): Payer: 59 | Admitting: Internal Medicine

## 2023-04-19 ENCOUNTER — Encounter: Payer: Self-pay | Admitting: Internal Medicine

## 2023-04-19 VITALS — BP 110/68 | HR 76 | Temp 98.5°F | Ht 61.0 in | Wt 194.4 lb

## 2023-04-19 DIAGNOSIS — D6869 Other thrombophilia: Secondary | ICD-10-CM

## 2023-04-19 DIAGNOSIS — I480 Paroxysmal atrial fibrillation: Secondary | ICD-10-CM | POA: Diagnosis not present

## 2023-04-19 DIAGNOSIS — Z6836 Body mass index (BMI) 36.0-36.9, adult: Secondary | ICD-10-CM

## 2023-04-19 DIAGNOSIS — I119 Hypertensive heart disease without heart failure: Secondary | ICD-10-CM

## 2023-04-19 DIAGNOSIS — E785 Hyperlipidemia, unspecified: Secondary | ICD-10-CM | POA: Diagnosis not present

## 2023-04-19 DIAGNOSIS — Z1211 Encounter for screening for malignant neoplasm of colon: Secondary | ICD-10-CM

## 2023-04-19 DIAGNOSIS — E1169 Type 2 diabetes mellitus with other specified complication: Secondary | ICD-10-CM

## 2023-04-19 DIAGNOSIS — E66812 Obesity, class 2: Secondary | ICD-10-CM

## 2023-04-19 DIAGNOSIS — Z23 Encounter for immunization: Secondary | ICD-10-CM

## 2023-04-19 MED ORDER — MOUNJARO 7.5 MG/0.5ML ~~LOC~~ SOAJ
7.5000 mg | SUBCUTANEOUS | 3 refills | Status: DC
Start: 2023-04-19 — End: 2023-08-12

## 2023-04-19 MED ORDER — LEVOCETIRIZINE DIHYDROCHLORIDE 5 MG PO TABS: 5.0000 mg | ORAL_TABLET | Freq: Every day | ORAL | 2 refills | Status: AC

## 2023-04-19 MED ORDER — DILTIAZEM HCL ER COATED BEADS 120 MG PO CP24: ORAL_CAPSULE | ORAL | 2 refills | Status: DC

## 2023-04-19 MED ORDER — ATORVASTATIN CALCIUM 80 MG PO TABS: 80.0000 mg | ORAL_TABLET | Freq: Every day | ORAL | 2 refills | Status: DC

## 2023-04-19 MED ORDER — EZETIMIBE 10 MG PO TABS: 10.0000 mg | ORAL_TABLET | Freq: Every day | ORAL | 2 refills | Status: DC

## 2023-04-19 NOTE — Patient Instructions (Signed)
 Hypertension, Adult Hypertension is another name for high blood pressure. High blood pressure forces your heart to work harder to pump blood. This can cause problems over time. There are two numbers in a blood pressure reading. There is a top number (systolic) over a bottom number (diastolic). It is best to have a blood pressure that is below 120/80. What are the causes? The cause of this condition is not known. Some other conditions can lead to high blood pressure. What increases the risk? Some lifestyle factors can make you more likely to develop high blood pressure: Smoking. Not getting enough exercise or physical activity. Being overweight. Having too much fat, sugar, calories, or salt (sodium) in your diet. Drinking too much alcohol. Other risk factors include: Having any of these conditions: Heart disease. Diabetes. High cholesterol. Kidney disease. Obstructive sleep apnea. Having a family history of high blood pressure and high cholesterol. Age. The risk increases with age. Stress. What are the signs or symptoms? High blood pressure may not cause symptoms. Very high blood pressure (hypertensive crisis) may cause: Headache. Fast or uneven heartbeats (palpitations). Shortness of breath. Nosebleed. Vomiting or feeling like you may vomit (nauseous). Changes in how you see. Very bad chest pain. Feeling dizzy. Seizures. How is this treated? This condition is treated by making healthy lifestyle changes, such as: Eating healthy foods. Exercising more. Drinking less alcohol. Your doctor may prescribe medicine if lifestyle changes do not help enough and if: Your top number is above 130. Your bottom number is above 80. Your personal target blood pressure may vary. Follow these instructions at home: Eating and drinking  If told, follow the DASH eating plan. To follow this plan: Fill one half of your plate at each meal with fruits and vegetables. Fill one fourth of your plate  at each meal with whole grains. Whole grains include whole-wheat pasta, brown rice, and whole-grain bread. Eat or drink low-fat dairy products, such as skim milk or low-fat yogurt. Fill one fourth of your plate at each meal with low-fat (lean) proteins. Low-fat proteins include fish, chicken without skin, eggs, beans, and tofu. Avoid fatty meat, cured and processed meat, or chicken with skin. Avoid pre-made or processed food. Limit the amount of salt in your diet to less than 1,500 mg each day. Do not drink alcohol if: Your doctor tells you not to drink. You are pregnant, may be pregnant, or are planning to become pregnant. If you drink alcohol: Limit how much you have to: 0-1 drink a day for women. 0-2 drinks a day for men. Know how much alcohol is in your drink. In the [place] one drink equals one 12 oz bottle of beer (355 mL), one 5 oz glass of wine (148 mL), or one 1 oz glass of hard liquor (44 mL). Lifestyle  Work with your doctor to stay at a healthy weight or to lose weight. Ask your doctor what the best weight is for you. Get at least 30 minutes of exercise that causes your heart to beat faster (aerobic exercise) most days of the week. This may include walking, swimming, or biking. Get at least 30 minutes of exercise that strengthens your muscles (resistance exercise) at least 3 days a week. This may include lifting weights or doing Pilates. Do not smoke or use any products that contain nicotine or tobacco. If you need help quitting, ask your doctor. Check your blood pressure at home as told by your doctor. Keep all follow-up visits. Medicines Take over-the-counter and prescription medicines  only as told by your doctor. Follow directions carefully. Do not skip doses of blood pressure medicine. The medicine does not work as well if you skip doses. Skipping doses also puts you at risk for problems. Ask your doctor about side effects or reactions to medicines that you should watch  for. Contact a doctor if: You think you are having a reaction to the medicine you are taking. You have headaches that keep coming back. You feel dizzy. You have swelling in your ankles. You have trouble with your vision. Get help right away if: You get a very bad headache. You start to feel mixed up (confused). You feel weak or numb. You feel faint. You have very bad pain in your: Chest. Belly (abdomen). You vomit more than once. You have trouble breathing. These symptoms may be an emergency. Get help right away. Call 911. Do not wait to see if the symptoms will go away. Do not drive yourself to the hospital. Summary Hypertension is another name for high blood pressure. High blood pressure forces your heart to work harder to pump blood. For most people, a normal blood pressure is less than 120/80. Making healthy choices can help lower blood pressure. If your blood pressure does not get lower with healthy choices, you may need to take medicine. This information is not intended to replace advice given to you by your health care provider. Make sure you discuss any questions you have with your health care provider. Document Revised: 10/16/2020 Document Reviewed: 10/16/2020 Elsevier Patient Education  2024 [employer].

## 2023-04-19 NOTE — Telephone Encounter (Signed)
===  View-only below this line=== ----- Message ----- From: Bertram Savin, CMA Sent: 04/18/2023   3:14 PM EDT To: Marlowe Kays, CMA Subject: FW: Brilinta hold                               ----- Message ----- From: Sharee Pimple Sent: 04/18/2023  12:28 PM EDT To: Bertram Savin, CMA Subject: FW: Brilinta hold                               ----- Message ----- From: Rosalita Levan, PA Sent: 04/18/2023  12:12 PM EDT To: Sharee Pimple Subject: RE: Marden Noble hold                              Renee Rival,  Dr. Corliss Skains approved a 5 day hold of brilinta. Please advise that she is on eliquis (which he does not prescribe) so the patient will need to talk to the ordering provider specifically about stopping eliquis.   Thanks! Marissa ----- Message ----- From: Sharee Pimple Sent: 04/18/2023  11:21 AM EDT To: Rosalita Levan, PA Subject: RE: Brilinta hold                              Colonoscopy ----- Message ----- From: Rosalita Levan, PA Sent: 04/18/2023  11:05 AM EDT To: Sharee Pimple Subject: RE: Brilinta hold                              Good morning!  What is she requesting to hold this for? I talk with Dev soon.  -Marissa ----- Message ----- From: Villa Herb, PA-C Sent: 04/18/2023   9:17 AM EDT To: Rosalita Levan, PA Subject: Brilinta hold                                  Hi!  Have Dr. Corliss Skains take a look at this patient and see if she can hold her Brilinta. There is a note in the chart from the CMA on 04/15/23 with the request, if you have him look at it I will reply to her and let her know what he said.  Thanks! [name]

## 2023-04-19 NOTE — Progress Notes (Signed)
 I,Victoria T Basil Lim, CMA,acting as a Neurosurgeon for Rebekah Dung, MD.,have documented all relevant documentation on the behalf of Rebekah Dung, MD,as directed by  Rebekah Dung, MD while in the presence of Rebekah Dung, MD.  Subjective:  Patient ID: [name] , female    DOB: 15-Jan-1960 , 63 y.o.   MRN: [id number]  Chief Complaint  Patient presents with   Hypertension    Patient presents today for bp & dm follow up. She reports compliance with medications. Denies headache, chest pain & sob. She has no specific questions or concerns.  She reports an episode of her bp being low. 65/55. This happened last Thursday. She states feeling faint which made her check her bp.  She is scheduled to complete colonoscopy on 5/7.    Diabetes    HPI Discussed the use of AI scribe software for clinical note transcription with the patient, who gave verbal consent to proceed.  History of Present Illness [name] is a 63 year old female who presents for medication management and follow-up.  She is managing hypertension with diltiazem taken in the morning and amlodipine as part of her olmesartan regimen. She experienced a recent episode of hypotension, with a blood pressure reading of 65/55, attributed to inadequate food intake. She managed the episode by consuming something salty and drinking orange juice.  For diabetes management, she is on Mounjaro, administered once weekly. She has experienced significant weight loss, from 208 pounds to 194 pounds, but suffers from persistent nausea and a lack of appetite, making it challenging to maintain adequate nutrition. She is attempting to sustain her protein intake by consuming tuna, beans, and chicken, and has eliminated fried foods from her diet. Additionally, she is fasting for Bryce Captain, which involves skipping a meal, but she typically feels hungry around lunchtime.  She is on Lipitor (atorvastatin) at 80 mg for cholesterol management, receiving a  30-day supply, and also takes Zetia (ezetimibe) 10 mg daily. She is prescribed Tessalon Perles as needed.  For atrial fibrillation, she is on Eliquis twice daily and reports bruising on her stomach and arms, which she attributes to the anticoagulant. She takes vitamin C to help with bruising.  She experiences worsening allergies and is taking Xyzal (levocetirizine) in the morning.  She is scheduled for a colonoscopy on May 7th, which was delayed due to a snowstorm. She completed her preoperative appointment last week.  She engages in regular physical activity, including walking, and plans to resume strength training by renewing her gym membership. She is mindful of her diet and exercise to prevent muscle atrophy.   Hypertension This is a chronic problem. The current episode started more than 1 year ago. The problem has been gradually improving since onset. The problem is controlled. Pertinent negatives include no blurred vision, palpitations or shortness of breath. The current treatment provides moderate improvement. Compliance problems include exercise.   Diabetes She presents for her follow-up diabetic visit. She has type 2 diabetes mellitus. There are no hypoglycemic associated symptoms. Pertinent negatives for diabetes include no blurred vision. There are no hypoglycemic complications. Risk factors for coronary artery disease include diabetes mellitus, dyslipidemia, hypertension, obesity, post-menopausal and sedentary lifestyle. Meal planning includes avoidance of concentrated sweets. She has not had a previous visit with a dietitian. She participates in exercise intermittently. An ACE inhibitor/angiotensin II receptor blocker is being taken.     Past Medical History:  Diagnosis Date   Allergy    Arthritis  back    Atrial fibrillation (HCC)    Atypical chest pain 06/30/2016   Back pain    DUE TO INJURY AT WORK ON05/2013   Bruises easily    Chronic lower back pain    Coronary artery  disease    Headache(784.0)    rarely   History of bronchitis    last time about 2yrs ago    Hypertension    takes Benicar daily   OSA (obstructive sleep apnea) 04/28/2020   Preoperative evaluation of a medical condition to rule out surgical contraindications (TAR required) 09/12/2020   Pure hypercholesterolemia 05/31/2019   Stroke (HCC)    no residual   Vitamin D deficiency    takes Vitamin D 2 times/wk   Weakness    and numbness right foot     Family History  Problem Relation Age of Onset   Heart disease Mother    CAD Mother    Stroke Mother    Heart disease Sister    Heart attack Sister    Liver disease Brother    Other Father        unknown medical history   CAD Brother    Colon cancer Neg Hx    Esophageal cancer Neg Hx    Rectal cancer Neg Hx    Stomach cancer Neg Hx      Current Outpatient Medications:    benzonatate (TESSALON PERLES) 100 MG capsule, Take 1 capsule (100 mg total) by mouth 3 (three) times daily as needed for cough., Disp: 30 capsule, Rfl: 1   cholecalciferol (VITAMIN D3) 25 MCG (1000 UNIT) tablet, Take 1,000 Units by mouth daily. , Disp: , Rfl:    ELIQUIS 5 MG TABS tablet, TAKE 1 TABLET BY MOUTH TWICE A DAY, Disp: 60 tablet, Rfl: 5   fluticasone (FLONASE) 50 MCG/ACT nasal spray, PLACE 2 SPRAYS IN BOTH NOSTRILS DAILY AS NEEDED FOR ALLERGIES OR RHINITIS, Disp: 16 mL, Rfl: 8   Magnesium 250 MG TABS, TAKE 1 TABLET BY MOUTH DAILY WITH EVENING MEALS, Disp: 90 tablet, Rfl: 2   Olmesartan-amLODIPine-HCTZ 20-5-12.5 MG TABS, TAKE 1 TABLET BY MOUTH EVERY DAY, Disp: 90 tablet, Rfl: 2   ticagrelor (BRILINTA) 90 MG TABS tablet, Take 1 tablet (90 mg total) by mouth 2 (two) times daily., Disp: 60 tablet, Rfl: 3   topiramate (TOPAMAX) 100 MG tablet, Take 1 tablet (100 mg total) by mouth at bedtime., Disp: 90 tablet, Rfl: 4   traMADol (ULTRAM) 50 MG tablet, Take 1 tablet (50 mg total) by mouth daily as needed., Disp: 30 tablet, Rfl: 0   amoxicillin (AMOXIL) 500 MG  tablet, Take 4 tablets by mouth 1 hour prior to dental procedure. (Patient not taking: Reported on 04/19/2023), Disp: 4 tablet, Rfl: 2   atorvastatin (LIPITOR) 80 MG tablet, Take 1 tablet (80 mg total) by mouth daily., Disp: 90 tablet, Rfl: 2   diltiazem (CARDIZEM CD) 120 MG 24 hr capsule, TAKE 1 CAPSULE BY MOUTH EVERY DAY, Disp: 90 capsule, Rfl: 2   ezetimibe (ZETIA) 10 MG tablet, Take 1 tablet (10 mg total) by mouth daily., Disp: 90 tablet, Rfl: 2   levocetirizine (XYZAL) 5 MG tablet, Take 1 tablet (5 mg total) by mouth daily., Disp: 90 tablet, Rfl: 2   tirzepatide (MOUNJARO) 7.5 MG/0.5ML Pen, Inject 7.5 mg into the skin once a week., Disp: 2 mL, Rfl: 3   Allergies  Allergen Reactions   Other Itching and Other (See Comments)    Patient experienced intense itching in her  arm that was accessed for a past angiogram     Review of Systems  Constitutional: Negative.   Eyes:  Negative for blurred vision.  Respiratory: Negative.  Negative for shortness of breath.   Cardiovascular: Negative.  Negative for palpitations.  Gastrointestinal: Negative.   Neurological: Negative.   Psychiatric/Behavioral: Negative.       Today's Vitals   04/19/23 1136  BP: 110/68  Pulse: 76  Temp: 98.5 F (36.9 C)  SpO2: 98%  Weight: 194 lb 6.4 oz (88.2 kg)  Height: 5\' 1"  (1.549 m)   Body mass index is 36.73 kg/m.  Wt Readings from Last 3 Encounters:  04/19/23 194 lb 6.4 oz (88.2 kg)  04/18/23 195 lb (88.5 kg)  04/14/23 193 lb 9.6 oz (87.8 kg)    BP Readings from Last 3 Encounters:  04/19/23 110/68  04/18/23 128/72  04/14/23 128/60    Objective:  Physical Exam Vitals and nursing note reviewed.  Constitutional:      Appearance: Normal appearance.  HENT:     Head: Normocephalic and atraumatic.  Eyes:     Extraocular Movements: Extraocular movements intact.  Cardiovascular:     Rate and Rhythm: Normal rate and regular rhythm.     Heart sounds: Normal heart sounds.  Pulmonary:     Effort:  Pulmonary effort is normal.     Breath sounds: Normal breath sounds.  Musculoskeletal:     Cervical back: Normal range of motion.  Skin:    General: Skin is warm.  Neurological:     General: No focal deficit present.     Mental Status: She is alert.  Psychiatric:        Mood and Affect: Mood normal.        Behavior: Behavior normal.         Assessment And Plan:  Hypertensive heart disease without heart failure Assessment & Plan: Chronic, well controlled. Goal BP<120/80.   She will continue with Cardizem CD 120mg  daily and olmesartan/amlodipine/hydrochlorothiazide 20/5/12.5mg  daily. She is encouraged to follow low sodium diet. She will f/u in four to six months for re-evaluation.   Hypotension likely due to insufficient food intake. - Refill diltiazem prescription. - Advise to consume at least two meals and a snack daily.  Orders: -     CMP14+EGFR  Paroxysmal atrial fibrillation (HCC) Assessment & Plan: Chronic, currently in sinus rhythm. She is on Eliquis for anticoagulation and diltiazem for rate control.    Dyslipidemia associated with type 2 diabetes mellitus (HCC) Assessment & Plan: Chronic, LDL goal is less than 70.  She will continue with atorvastatin 80mg  daily. She will continue with Mounjaro 7.5mg  weekly. She is reminded to be intentional regarding protein intake and to incorporate at least two days of strength training twice weekly. She will f/u in 3-4 months.    Weight loss and potential malnutrition due to nausea and reduced appetite. - Advise to consume protein-rich foods such as tuna, chicken, and beans. - Encourage strength training at least twice a week. - Monitor weight and symptoms.   Orders: -     CMP14+EGFR -     Hemoglobin A1c  Acquired thrombophilia (HCC) Assessment & Plan: Chronic, she will continue with Eliquis for underlying PAF.    Class 2 severe obesity due to excess calories with serious comorbidity and body mass index (BMI) of 36.0 to  36.9 in adult [facility]) Assessment & Plan: She was congratulated on her weight loss thus far. She is encouraged to incorporate strength training into her  workout routine.    Screen for colon cancer Assessment & Plan: - Proceed with scheduled colonoscopy on May 7th.   Immunization due -     Pneumococcal conjugate vaccine 20-valent  Other orders -     Atorvastatin Calcium; Take 1 tablet (80 mg total) by mouth daily.  Dispense: 90 tablet; Refill: 2 -     dilTIAZem HCl ER Coated Beads; TAKE 1 CAPSULE BY MOUTH EVERY DAY  Dispense: 90 capsule; Refill: 2 -     Ezetimibe; Take 1 tablet (10 mg total) by mouth daily.  Dispense: 90 tablet; Refill: 2 -     Levocetirizine Dihydrochloride; Take 1 tablet (5 mg total) by mouth daily.  Dispense: 90 tablet; Refill: 2 -     Mounjaro; Inject 7.5 mg into the skin once a week.  Dispense: 2 mL; Refill: 3   Return for 4 month dm f/u.Aaron Aas  Patient was given opportunity to ask questions. Patient verbalized understanding of the plan and was able to repeat key elements of the plan. All questions were answered to their satisfaction.    I, Rebekah Dung, MD, have reviewed all documentation for this visit. The documentation on 04/19/23 for the exam, diagnosis, procedures, and orders are all accurate and complete.   IF YOU HAVE BEEN REFERRED TO A SPECIALIST, IT MAY TAKE 1-2 WEEKS TO SCHEDULE/PROCESS THE REFERRAL. IF YOU HAVE NOT HEARD FROM US /SPECIALIST IN TWO WEEKS, PLEASE GIVE US  A CALL AT [phone].   THE PATIENT IS ENCOURAGED TO PRACTICE SOCIAL DISTANCING DUE TO THE COVID-19 PANDEMIC.

## 2023-04-20 LAB — CMP14+EGFR
ALT: 30 IU/L (ref 0–32)
AST: 30 IU/L (ref 0–40)
Albumin: 4.8 g/dL (ref 3.9–4.9)
Alkaline Phosphatase: 101 IU/L (ref 44–121)
BUN/Creatinine Ratio: 6 — ABNORMAL LOW (ref 12–28)
BUN: 6 mg/dL — ABNORMAL LOW (ref 8–27)
Bilirubin Total: 1.1 mg/dL (ref 0.0–1.2)
CO2: 22 mmol/L (ref 20–29)
Calcium: 10.2 mg/dL (ref 8.7–10.3)
Chloride: 102 mmol/L (ref 96–106)
Creatinine, Ser: 0.97 mg/dL (ref 0.57–1.00)
Globulin, Total: 3.5 g/dL (ref 1.5–4.5)
Glucose: 90 mg/dL (ref 70–99)
Potassium: 3.8 mmol/L (ref 3.5–5.2)
Sodium: 143 mmol/L (ref 134–144)
Total Protein: 8.3 g/dL (ref 6.0–8.5)
eGFR: 66 mL/min/{1.73_m2} (ref >59)

## 2023-04-20 LAB — HEMOGLOBIN A1C
Est. average glucose Bld gHb Est-mCnc: 134 mg/dL
Hgb A1c MFr Bld: 6.3 % — ABNORMAL HIGH (ref 4.8–5.6)

## 2023-04-20 NOTE — Telephone Encounter (Signed)
 Patient informed to hold Eliquis 2 days and Brilinta 5 days . She understood instructions

## 2023-04-23 NOTE — Assessment & Plan Note (Signed)
 Chronic, well controlled. Goal BP<120/80.   She will continue with Cardizem CD 120mg  daily and olmesartan/amlodipine/hydrochlorothiazide 20/5/12.5mg  daily. She is encouraged to follow low sodium diet. She will f/u in four to six months for re-evaluation.   Hypotension likely due to insufficient food intake. - Refill diltiazem prescription. - Advise to consume at least two meals and a snack daily.

## 2023-04-23 NOTE — Assessment & Plan Note (Signed)
 Chronic, LDL goal is less than 70.  She will continue with atorvastatin 80mg  daily. She will continue with Mounjaro 7.5mg  weekly. She is reminded to be intentional regarding protein intake and to incorporate at least two days of strength training twice weekly. She will f/u in 3-4 months.    Weight loss and potential malnutrition due to nausea and reduced appetite. - Advise to consume protein-rich foods such as tuna, chicken, and beans. - Encourage strength training at least twice a week. - Monitor weight and symptoms.

## 2023-04-23 NOTE — Assessment & Plan Note (Signed)
-   Proceed with scheduled colonoscopy on May 7th.

## 2023-04-23 NOTE — Assessment & Plan Note (Signed)
Chronic, she will continue with Eliquis for underlying PAF.

## 2023-04-23 NOTE — Assessment & Plan Note (Signed)
Chronic, currently in sinus rhythm. She is on Eliquis for anticoagulation and diltiazem for rate control.

## 2023-04-23 NOTE — Assessment & Plan Note (Signed)
 She was congratulated on her weight loss thus far. She is encouraged to incorporate strength training into her workout routine.

## 2023-05-18 ENCOUNTER — Encounter: Payer: Self-pay | Admitting: Gastroenterology

## 2023-05-18 ENCOUNTER — Ambulatory Visit: Admitting: Gastroenterology

## 2023-05-18 VITALS — BP 136/82 | HR 71 | Temp 97.3°F | Resp 19 | Ht 61.0 in | Wt 193.0 lb

## 2023-05-18 DIAGNOSIS — K648 Other hemorrhoids: Secondary | ICD-10-CM

## 2023-05-18 DIAGNOSIS — K644 Residual hemorrhoidal skin tags: Secondary | ICD-10-CM

## 2023-05-18 DIAGNOSIS — Z1211 Encounter for screening for malignant neoplasm of colon: Secondary | ICD-10-CM

## 2023-05-18 DIAGNOSIS — D122 Benign neoplasm of ascending colon: Secondary | ICD-10-CM

## 2023-05-18 DIAGNOSIS — Z7902 Long term (current) use of antithrombotics/antiplatelets: Secondary | ICD-10-CM

## 2023-05-18 DIAGNOSIS — D128 Benign neoplasm of rectum: Secondary | ICD-10-CM | POA: Diagnosis not present

## 2023-05-18 DIAGNOSIS — Z7901 Long term (current) use of anticoagulants: Secondary | ICD-10-CM

## 2023-05-18 MED ORDER — SODIUM CHLORIDE 0.9 % IV SOLN: 500.0000 mL | Freq: Once | INTRAVENOUS | Status: AC

## 2023-05-18 NOTE — Progress Notes (Unsigned)
 Pt's states no medical or surgical changes since previsit or office visit.

## 2023-05-18 NOTE — Progress Notes (Unsigned)
 [facility] History and Physical   Primary Care Physician:  Cleave Curling, MD   Reason for Procedure:  Colorectal cancer screening  Plan:    Screening colonoscopy with possible interventions as needed     HPI: [name] is a very pleasant 63 y.o. female here for screening colonoscopy. Denies any nausea, vomiting, abdominal pain, melena or bright red blood per rectum  The risks and benefits as well as alternatives of endoscopic procedure(s) have been discussed and reviewed. All questions answered. The patient agrees to proceed.    Past Medical History:  Diagnosis Date   Allergy    Arthritis    back    Atrial fibrillation (HCC)    Atypical chest pain 06/30/2016   Back pain    DUE TO INJURY AT WORK ON05/2013   Bruises easily    Chronic lower back pain    Coronary artery disease    Headache(784.0)    rarely   History of bronchitis    last time about 21yrs ago    Hypertension    takes Benicar  daily   OSA (obstructive sleep apnea) 04/28/2020   Preoperative evaluation of a medical condition to rule out surgical contraindications (TAR required) 09/12/2020   Pure hypercholesterolemia 05/31/2019   Stroke (HCC)    no residual   Vitamin D  deficiency    takes Vitamin D  2 times/wk   Weakness    and numbness right foot    Past Surgical History:  Procedure Laterality Date   BACK SURGERY     CARPAL TUNNEL RELEASE Right    CESAREAN SECTION  1988   COLONOSCOPY     EXPLORATORY LAPAROTOMY  1991   "took out fatty tumor" (11/09/2012)   IR ANGIO INTRA EXTRACRAN SEL COM CAROTID INNOMINATE BILAT MOD SED  08/10/2019   IR ANGIO INTRA EXTRACRAN SEL COM CAROTID INNOMINATE BILAT MOD SED  10/07/2020   IR ANGIO INTRA EXTRACRAN SEL COM CAROTID INNOMINATE UNI L MOD SED  11/05/2020   IR ANGIO VERTEBRAL SEL SUBCLAVIAN INNOMINATE BILAT MOD SED  10/07/2020   IR ANGIO VERTEBRAL SEL VERTEBRAL BILAT MOD SED  08/10/2019   IR CT HEAD LTD  11/05/2020   IR INTRAVSC STENT CERV CAROTID  W/EMB-PROT MOD SED INCL ANGIO  11/05/2020   IR RADIOLOGIST EVAL & MGMT  11/21/2020   IR US  GUIDE VASC ACCESS RIGHT  08/10/2019   IR US  GUIDE VASC ACCESS RIGHT  10/07/2020   LEFT HEART CATH AND CORONARY ANGIOGRAPHY N/A 08/06/2016   Procedure: Left Heart Cath and Coronary Angiography;  Surgeon: Arty Binning, MD;  Location: [facility] INVASIVE CV LAB;  Service: Cardiovascular;  Laterality: N/A;   LEFT HEART CATH AND CORONARY ANGIOGRAPHY N/A 07/12/2018   Procedure: LEFT HEART CATH AND CORONARY ANGIOGRAPHY;  Surgeon: Arleen Lacer, MD;  Location: [facility] INVASIVE CV LAB;  Service: Cardiovascular;  Laterality: N/A;   LUMBAR LAMINECTOMY/DECOMPRESSION MICRODISCECTOMY  11/09/2012   "L3-4" (11/09/2012)   LUMBAR LAMINECTOMY/DECOMPRESSION MICRODISCECTOMY N/A 11/09/2012   Procedure: L3-L4 DECOMPRESSION AND MICRODISCECTOMY   (1 LEVEL);  Surgeon: Mort Ards, MD;  Location[facility] OR;  Service: Orthopedics;  Laterality: N/A;   RADIOLOGY WITH ANESTHESIA N/A 11/05/2020   Procedure: STENTING;  Surgeon: Luellen Sages, MD;  Location: [facility] OR;  Service: Radiology;  Laterality: N/A;   TOTAL HIP ARTHROPLASTY Left 05/11/2021   Procedure: LEFT TOTAL HIP ARTHROPLASTY ANTERIOR APPROACH;  Surgeon: Wes Hamman, MD;  Location: [facility] OR;  Service: Orthopedics;  Laterality: Left;  3-C    Prior to Admission  medications   Medication Sig Start Date End Date Taking? Authorizing Provider  atorvastatin  (LIPITOR ) 80 MG tablet Take 1 tablet (80 mg total) by mouth daily. 04/19/23  Yes Cleave Curling, MD  cholecalciferol (VITAMIN D3) 25 MCG (1000 UNIT) tablet Take 1,000 Units by mouth daily.    Yes [provider]  diltiazem  (CARDIZEM  CD) 120 MG 24 hr capsule TAKE 1 CAPSULE BY MOUTH EVERY DAY 04/19/23  Yes Cleave Curling, MD  ezetimibe  (ZETIA ) 10 MG tablet Take 1 tablet (10 mg total) by mouth daily. 04/19/23  Yes Cleave Curling, MD  levocetirizine (XYZAL ) 5 MG tablet Take 1 tablet (5 mg total) by mouth daily. 04/19/23  Yes Cleave Curling, MD   Olmesartan -amLODIPine -HCTZ 20-5-12.5 MG TABS TAKE 1 TABLET BY MOUTH EVERY DAY 01/17/23  Yes Cleave Curling, MD  topiramate  (TOPAMAX ) 100 MG tablet Take 1 tablet (100 mg total) by mouth at bedtime. 10/25/22  Yes Wess Hammed, NP  benzonatate  (TESSALON  PERLES) 100 MG capsule Take 1 capsule (100 mg total) by mouth 3 (three) times daily as needed for cough. 09/08/22 09/08/23  Melodie Spry, NP  ELIQUIS  5 MG TABS tablet TAKE 1 TABLET BY MOUTH TWICE A DAY 12/14/22   Maudine Sos, MD  fluticasone  (FLONASE ) 50 MCG/ACT nasal spray PLACE 2 SPRAYS IN BOTH NOSTRILS DAILY AS NEEDED FOR ALLERGIES OR RHINITIS 10/14/21   Cleave Curling, MD  Magnesium  250 MG TABS TAKE 1 TABLET BY MOUTH DAILY WITH EVENING MEALS 10/13/20   Cleave Curling, MD  ticagrelor  (BRILINTA ) 90 MG TABS tablet Take 1 tablet (90 mg total) by mouth 2 (two) times daily. 03/08/23   Nathan Bake A, PA-C  tirzepatide  (MOUNJARO ) 7.5 MG/0.5ML Pen Inject 7.5 mg into the skin once a week. 04/19/23   Cleave Curling, MD  traMADol  (ULTRAM ) 50 MG tablet Take 1 tablet (50 mg total) by mouth daily as needed. 04/16/22   Sandie Cross, PA-C    Current Outpatient Medications  Medication Sig Dispense Refill   atorvastatin  (LIPITOR ) 80 MG tablet Take 1 tablet (80 mg total) by mouth daily. 90 tablet 2   cholecalciferol (VITAMIN D3) 25 MCG (1000 UNIT) tablet Take 1,000 Units by mouth daily.      diltiazem  (CARDIZEM  CD) 120 MG 24 hr capsule TAKE 1 CAPSULE BY MOUTH EVERY DAY 90 capsule 2   ezetimibe  (ZETIA ) 10 MG tablet Take 1 tablet (10 mg total) by mouth daily. 90 tablet 2   levocetirizine (XYZAL ) 5 MG tablet Take 1 tablet (5 mg total) by mouth daily. 90 tablet 2   Olmesartan -amLODIPine -HCTZ 20-5-12.5 MG TABS TAKE 1 TABLET BY MOUTH EVERY DAY 90 tablet 2   topiramate  (TOPAMAX ) 100 MG tablet Take 1 tablet (100 mg total) by mouth at bedtime. 90 tablet 4   benzonatate  (TESSALON  PERLES) 100 MG capsule Take 1 capsule (100 mg total) by mouth 3 (three) times daily as  needed for cough. 30 capsule 1   ELIQUIS  5 MG TABS tablet TAKE 1 TABLET BY MOUTH TWICE A DAY 60 tablet 5   fluticasone  (FLONASE ) 50 MCG/ACT nasal spray PLACE 2 SPRAYS IN BOTH NOSTRILS DAILY AS NEEDED FOR ALLERGIES OR RHINITIS 16 mL 8   Magnesium  250 MG TABS TAKE 1 TABLET BY MOUTH DAILY WITH EVENING MEALS 90 tablet 2   ticagrelor  (BRILINTA ) 90 MG TABS tablet Take 1 tablet (90 mg total) by mouth 2 (two) times daily. 60 tablet 3   tirzepatide  (MOUNJARO ) 7.5 MG/0.5ML Pen Inject 7.5 mg into the skin once a week. 2 mL 3  traMADol  (ULTRAM ) 50 MG tablet Take 1 tablet (50 mg total) by mouth daily as needed. 30 tablet 0   Current Facility-Administered Medications  Medication Dose Route Frequency Provider Last Rate Last Admin   0.9 %  sodium chloride  infusion  500 mL Intravenous Once Calvin Chura V, MD        Allergies as of 05/18/2023 - Review Complete 04/19/2023  Allergen Reaction Noted   Other Itching and Other (See Comments) 03/16/2020    Family History  Problem Relation Age of Onset   Heart disease Mother    CAD Mother    Stroke Mother    Heart disease Sister    Heart attack Sister    Liver disease Brother    Other Father        unknown medical history   CAD Brother    Colon cancer Neg Hx    Esophageal cancer Neg Hx    Rectal cancer Neg Hx    Stomach cancer Neg Hx     Social History   Socioeconomic History   Marital status: Divorced    Spouse name: Not on file   Number of children: 1   Years of education: Not on file   Highest education level: Some college, no degree  Occupational History   Occupation: Control and instrumentation engineer  Tobacco Use   Smoking status: Never   Smokeless tobacco: Never  Vaping Use   Vaping status: Never Used  Substance and Sexual Activity   Alcohol use: Not Currently    Alcohol/week: 2.0 standard drinks of alcohol    Types: 2 Glasses of wine per week    Comment: social- 2 wine or 2 beers   Drug use: Never   Sexual activity: Yes     Birth control/protection: Post-menopausal  Other Topics Concern   Not on file  Social History Narrative   Right-handed.   Occasional caffeine.   Lives alone.   Social Drivers of Corporate investment banker Strain: Low Risk  (04/15/2023)   Overall Financial Resource Strain (CARDIA)    Difficulty of Paying Living Expenses: Not hard at all  Food Insecurity: No Food Insecurity (04/15/2023)   Hunger Vital Sign    Worried About Running Out of Food in the Last Year: Never true    Ran Out of Food in the Last Year: Never true  Transportation Needs: No Transportation Needs (04/15/2023)   PRAPARE - Administrator, Civil Service (Medical): No    Lack of Transportation (Non-Medical): No  Physical Activity: Insufficiently Active (04/15/2023)   Exercise Vital Sign    Days of Exercise per Week: 4 days    Minutes of Exercise per Session: 30 min  Stress: No Stress Concern Present (04/15/2023)   [employer] of Occupational Health - Occupational Stress Questionnaire    Feeling of Stress : Not at all  Social Connections: Moderately Integrated (04/15/2023)   Social Connection and Isolation Panel [NHANES]    Frequency of Communication with Friends and Family: More than three times a week    Frequency of Social Gatherings with Friends and Family: Once a week    Attends Religious Services: More than 4 times per year    Active Member of [employer] or Organizations: Yes    Attends Engineer, structural: More than 4 times per year    Marital Status: Divorced  Catering manager Violence: Not on file    Review of Systems:  All other review of systems negative except as mentioned in  the HPI.  Physical Exam: Vital signs in last 24 hours: BP (!) 164/91   Pulse 80   Temp (!) 97.3 F (36.3 C) (Skin)   Ht 5\' 1"  (1.549 m)   Wt 193 lb (87.5 kg)   LMP 06/03/2011   SpO2 100%   BMI 36.47 kg/m  General:   Alert, NAD Lungs:  Clear .   Heart:  Regular rate and rhythm Abdomen:  Soft, nontender  and nondistended. Neuro/Psych:  Alert and cooperative. Normal mood and affect. A and O x 3  Reviewed labs, radiology imaging, old records and pertinent past GI work up  Patient is appropriate for planned procedure(s) and anesthesia in an ambulatory setting   K. Veena Kaslyn [place] , MD [phone]

## 2023-05-18 NOTE — Op Note (Addendum)
 [facility] Patient Name: [name] Procedure Date: 05/18/2023 3:49 PM MRN: [id number] Endoscopist: Sergio Dandy , MD, [id number] Age: 63 Referring MD:  Date of Birth: 07/02/1960 Gender: Female Account #: [id number] Procedure:                Colonoscopy Indications:              Screening for colorectal malignant neoplasm Medicines:                Monitored Anesthesia Care Procedure:                Pre-Anesthesia Assessment:                           - Prior to the procedure, a History and Physical                            was performed, and patient medications and                            allergies were reviewed. The patient's tolerance of                            previous anesthesia was also reviewed. The risks                            and benefits of the procedure and the sedation                            options and risks were discussed with the patient.                            All questions were answered, and informed consent                            was obtained. Prior Anticoagulants: The patient                            last took Brilinta  (ticagrelor ) 5 days and Eliquis                             (apixaban ) 2 days prior to the procedure. ASA Grade                            Assessment: III - A patient with severe systemic                            disease. After reviewing the risks and benefits,                            the patient was deemed in satisfactory condition to                            undergo the procedure.  After obtaining informed consent, the colonoscope                            was passed under direct vision. Throughout the                            procedure, the patient's blood pressure, pulse, and                            oxygen saturations were monitored continuously. The                            Olympus Scope SN: [id number] was introduced through                            the anus and  advanced to the the cecum, identified                            by appendiceal orifice and ileocecal valve. The                            colonoscopy was performed without difficulty. The                            patient tolerated the procedure well. The quality                            of the bowel preparation was good. The ileocecal                            valve, appendiceal orifice, and rectum were                            photographed. Scope In: 3:56:13 PM Scope Out: 4:10:11 PM Scope Withdrawal Time: 0 hours 7 minutes 16 seconds  Total Procedure Duration: 0 hours 13 minutes 58 seconds  Findings:                 The perianal and digital rectal examinations were                            normal.                           Two sessile polyps were found in the rectum and                            ascending colon. The polyps were 1 to 2 mm in size.                            These polyps were removed with a cold biopsy                            forceps. Resection and retrieval were complete.  Non-bleeding external and internal hemorrhoids were                            found during retroflexion. The hemorrhoids were                            small. Complications:            No immediate complications. Estimated Blood Loss:     Estimated blood loss was minimal. Impression:               - Two 1 to 2 mm polyps in the rectum and in the                            ascending colon, removed with a cold biopsy                            forceps. Resected and retrieved.                           - Non-bleeding external and internal hemorrhoids. Recommendation:           - Patient has a contact number available for                            emergencies. The signs and symptoms of potential                            delayed complications were discussed with the                            patient. Return to normal activities tomorrow.                             Written discharge instructions were provided to the                            patient.                           - Resume previous diet.                           - Continue present medications.                           - Await pathology results.                           - Repeat colonoscopy in 5-10 years for surveillance.                           - Resume Brilinta  (ticagrelor ) tomorrow and Eliquis                             (apixaban ) tomorrow at prior doses. My [place] V. Endora Teresi, MD 05/18/2023 4:17:02 PM This report has  been signed electronically.

## 2023-05-18 NOTE — Progress Notes (Unsigned)
 Called to room to assist during endoscopic procedure.  Patient ID and intended procedure confirmed with present staff. Received instructions for my participation in the procedure from the performing physician.

## 2023-05-18 NOTE — Progress Notes (Unsigned)
 Report given to PACU, vss

## 2023-05-18 NOTE — Patient Instructions (Addendum)
 YOU HAD AN ENDOSCOPIC PROCEDURE TODAY AT THE [facility]:   Refer to the procedure report that was given to you for any specific questions about what was found during the examination.  If the procedure report does not answer your questions, please call your gastroenterologist to clarify.  If you requested that your care partner not be given the details of your procedure findings, then the procedure report has been included in a sealed envelope for you to review at your convenience later.  YOU SHOULD EXPECT: Some feelings of bloating in the abdomen. Passage of more gas than usual.  Walking can help get rid of the air that was put into your GI tract during the procedure and reduce the bloating. If you had a lower endoscopy (such as a colonoscopy or flexible sigmoidoscopy) you may notice spotting of blood in your stool or on the toilet paper. If you underwent a bowel prep for your procedure, you may not have a normal bowel movement for a few days.  Please Note:  You might notice some irritation and congestion in your nose or some drainage.  This is from the oxygen used during your procedure.  There is no need for concern and it should clear up in a day or so.  SYMPTOMS TO REPORT IMMEDIATELY:  Following lower endoscopy (colonoscopy or flexible sigmoidoscopy):  Excessive amounts of blood in the stool  Significant tenderness or worsening of abdominal pains  Swelling of the abdomen that is new, acute  Fever of 100F or higher  For urgent or emergent issues, a gastroenterologist can be reached at any hour by calling [phone]. Do not use MyChart messaging for urgent concerns.    DIET:  We do recommend a small meal at first, but then you may proceed to your regular diet.  Drink plenty of fluids but you should avoid alcoholic beverages for 24 hours.  MEDICATIONS: Continue present medications. Resume Eliquis  (apixaban ) and Brilinta  (ticagrelor ) tomorrow at prior doses.  FOLLOW UP: Await  pathology results. Repeat colonoscopy in 5-10 years for surveillance.  Please see handouts given to you by your recovery nurse: Polyps, Hemorrhoids.  Thank you for allowing us  to provide for your healthcare needs today.  ACTIVITY:  You should plan to take it easy for the rest of today and you should NOT DRIVE or use heavy machinery until tomorrow (because of the sedation medicines used during the test).    FOLLOW UP: Our staff will call the number listed on your records the next business day following your procedure.  We will call around 7:15- 8:00 am to check on you and address any questions or concerns that you may have regarding the information given to you following your procedure. If we do not reach you, we will leave a message.     If any biopsies were taken you will be contacted by phone or by letter within the next 1-3 weeks.  Please call us  at [phone] if you have not heard about the biopsies in 3 weeks.    SIGNATURES/CONFIDENTIALITY: You and/or your care partner have signed paperwork which will be entered into your electronic medical record.  These signatures attest to the fact that that the information above on your After Visit Summary has been reviewed and is understood.  Full responsibility of the confidentiality of this discharge information lies with you and/or your care-partner.

## 2023-05-19 ENCOUNTER — Telehealth: Payer: Self-pay

## 2023-05-19 NOTE — Telephone Encounter (Signed)
  Follow up Call-     05/18/2023    3:10 PM  Call back number  Post procedure Call Back phone  # [phone]  Permission to leave phone message Yes     Patient questions:  Do you have a fever, pain , or abdominal swelling? No. Pain Score  0 *  Have you tolerated food without any problems? Yes.    Have you been able to return to your normal activities? Yes.    Do you have any questions about your discharge instructions: Diet   No. Medications  No. Follow up visit  No.  Do you have questions or concerns about your Care? No.  Actions: * If pain score is 4 or above: No action needed, pain <4.

## 2023-05-23 LAB — SURGICAL PATHOLOGY

## 2023-06-20 ENCOUNTER — Ambulatory Visit: Payer: Self-pay | Admitting: Gastroenterology

## 2023-07-02 ENCOUNTER — Other Ambulatory Visit: Payer: Self-pay | Admitting: Cardiovascular Disease

## 2023-07-02 DIAGNOSIS — I480 Paroxysmal atrial fibrillation: Secondary | ICD-10-CM

## 2023-07-04 NOTE — Telephone Encounter (Signed)
 Eliquis  5mg  refill request received. Patient is 63 years old, weight-87.5kg, Crea-0.97 on 04/19/23, Diagnosis-Afib, and last seen by Rosaline Bane on 04/18/23. Dose is appropriate based on dosing criteria. Will send in refill to requested pharmacy.

## 2023-07-08 ENCOUNTER — Encounter (HOSPITAL_COMMUNITY): Payer: Self-pay | Admitting: Interventional Radiology

## 2023-07-11 ENCOUNTER — Other Ambulatory Visit: Payer: Self-pay | Admitting: Internal Medicine

## 2023-07-11 DIAGNOSIS — E1169 Type 2 diabetes mellitus with other specified complication: Secondary | ICD-10-CM

## 2023-07-26 ENCOUNTER — Other Ambulatory Visit: Payer: Self-pay

## 2023-07-26 ENCOUNTER — Emergency Department (HOSPITAL_BASED_OUTPATIENT_CLINIC_OR_DEPARTMENT_OTHER)

## 2023-07-26 ENCOUNTER — Emergency Department (HOSPITAL_BASED_OUTPATIENT_CLINIC_OR_DEPARTMENT_OTHER): Admitting: Radiology

## 2023-07-26 ENCOUNTER — Emergency Department (HOSPITAL_BASED_OUTPATIENT_CLINIC_OR_DEPARTMENT_OTHER)
Admission: EM | Admit: 2023-07-26 | Discharge: 2023-07-26 | Disposition: A | Discharge status: Home / Self Care | Attending: Emergency Medicine | Admitting: Emergency Medicine

## 2023-07-26 ENCOUNTER — Encounter (HOSPITAL_BASED_OUTPATIENT_CLINIC_OR_DEPARTMENT_OTHER): Payer: Self-pay | Admitting: Emergency Medicine

## 2023-07-26 DIAGNOSIS — Z8673 Personal history of transient ischemic attack (TIA), and cerebral infarction without residual deficits: Secondary | ICD-10-CM | POA: Insufficient documentation

## 2023-07-26 DIAGNOSIS — R519 Headache, unspecified: Secondary | ICD-10-CM | POA: Insufficient documentation

## 2023-07-26 DIAGNOSIS — S40022A Contusion of left upper arm, initial encounter: Secondary | ICD-10-CM | POA: Insufficient documentation

## 2023-07-26 DIAGNOSIS — I2510 Atherosclerotic heart disease of native coronary artery without angina pectoris: Secondary | ICD-10-CM | POA: Insufficient documentation

## 2023-07-26 DIAGNOSIS — S40021A Contusion of right upper arm, initial encounter: Secondary | ICD-10-CM | POA: Diagnosis not present

## 2023-07-26 DIAGNOSIS — Z7901 Long term (current) use of anticoagulants: Secondary | ICD-10-CM | POA: Insufficient documentation

## 2023-07-26 MED ORDER — ACETAMINOPHEN 500 MG PO TABS
1000.0000 mg | ORAL_TABLET | Freq: Once | ORAL | Status: AC
  Administered 2023-07-26: 1000 mg via ORAL

## 2023-07-26 MED ORDER — HYDROCODONE-ACETAMINOPHEN 5-325 MG PO TABS: 1.0000 | ORAL_TABLET | Freq: Four times a day (QID) | ORAL | 0 refills | Status: DC | PRN

## 2023-07-26 MED ORDER — CYCLOBENZAPRINE HCL 5 MG PO TABS
ORAL_TABLET | ORAL | Status: DC
Start: 2023-07-26 — End: 2023-07-27
  Filled 2023-07-26: qty 1

## 2023-07-26 MED ORDER — ACETAMINOPHEN 500 MG PO TABS
ORAL_TABLET | ORAL | Status: AC
  Filled 2023-07-26: qty 1

## 2023-07-26 MED ORDER — CYCLOBENZAPRINE HCL 10 MG PO TABS: 10.0000 mg | ORAL_TABLET | Freq: Two times a day (BID) | ORAL | 0 refills | Status: DC | PRN

## 2023-07-26 MED ORDER — CYCLOBENZAPRINE HCL 5 MG PO TABS
5.0000 mg | ORAL_TABLET | Freq: Once | ORAL | Status: AC
  Administered 2023-07-26: 5 mg via ORAL
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
## 2023-07-26 NOTE — Discharge Instructions (Addendum)
 CT imaging and x-ray imaging was overall reassuring.  Recommend ice of the affected hematomas for the next 24 hours, continue Tylenol  for pain control, opiates have been prescribed for breakthrough pain.  Follow-up with your PCP for repeat assessment of your hand.  Flexeril  has been prescribed for muscle spasm, do not operate heavy machinery while taking Flexeril .
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
## 2023-07-26 NOTE — ED Notes (Signed)
 Pt ambulated independently to restroom

## 2023-07-27 ENCOUNTER — Other Ambulatory Visit: Payer: Self-pay | Admitting: Internal Medicine

## 2023-07-27 DIAGNOSIS — R051 Acute cough: Secondary | ICD-10-CM

## 2023-07-27 MED ORDER — BENZONATATE 100 MG PO CAPS: 100.0000 mg | ORAL_CAPSULE | Freq: Three times a day (TID) | ORAL | 1 refills | Status: DC | PRN

## 2023-07-27 NOTE — Telephone Encounter (Signed)
 Copied from CRM [id number]. Topic: Clinical - Medication Refill >> Jul 27, 2023 12:54 PM Carlatta H wrote: Medication: benzonatate  (TESSALON  PERLES) 100 MG capsule  Has the patient contacted their pharmacy? No (Agent: If no, request that the patient contact the pharmacy for the refill. If patient does not wish to contact the pharmacy document the reason why and proceed with request.) (Agent: If yes, when and what did the pharmacy advise?)  This is the patient's preferred pharmacy:  [id number] - [place], [place] - [address] AT [facility] [address]  [place] [address] Phone: [phone] Fax: [phone]    Is this the correct pharmacy for this prescription? Yes If no, delete pharmacy and type the correct one.   Has the prescription been filled recently? No  Is the patient out of the medication? Yes  Has the patient been seen for an appointment in the last year OR does the patient have an upcoming appointment? Yes  Can we respond through MyChart? No  Agent: Please be advised that Rx refills may take up to 3 business days. We ask that you follow-up with your pharmacy.

## 2023-08-02 ENCOUNTER — Ambulatory Visit: Admitting: Internal Medicine

## 2023-08-12 ENCOUNTER — Encounter: Payer: Self-pay | Admitting: Family Medicine

## 2023-08-12 ENCOUNTER — Ambulatory Visit: Admitting: Family Medicine

## 2023-08-12 DIAGNOSIS — S40022D Contusion of left upper arm, subsequent encounter: Secondary | ICD-10-CM

## 2023-08-12 DIAGNOSIS — M25531 Pain in right wrist: Secondary | ICD-10-CM

## 2023-08-12 DIAGNOSIS — I119 Hypertensive heart disease without heart failure: Secondary | ICD-10-CM

## 2023-08-12 DIAGNOSIS — E785 Hyperlipidemia, unspecified: Secondary | ICD-10-CM

## 2023-08-12 DIAGNOSIS — M542 Cervicalgia: Secondary | ICD-10-CM | POA: Diagnosis not present

## 2023-08-12 DIAGNOSIS — I480 Paroxysmal atrial fibrillation: Secondary | ICD-10-CM

## 2023-08-12 DIAGNOSIS — D6869 Other thrombophilia: Secondary | ICD-10-CM

## 2023-08-12 DIAGNOSIS — E1169 Type 2 diabetes mellitus with other specified complication: Secondary | ICD-10-CM

## 2023-08-12 DIAGNOSIS — E1165 Type 2 diabetes mellitus with hyperglycemia: Secondary | ICD-10-CM | POA: Diagnosis not present

## 2023-08-12 MED ORDER — MOUNJARO 7.5 MG/0.5ML ~~LOC~~ SOAJ: 7.5000 mg | SUBCUTANEOUS | 3 refills | Status: DC

## 2023-08-12 MED ORDER — EZETIMIBE 10 MG PO TABS
10.0000 mg | ORAL_TABLET | Freq: Every day | ORAL | 2 refills | Status: AC
Start: 2023-08-12 — End: ?

## 2023-08-12 MED ORDER — OLMESARTAN-AMLODIPINE-HCTZ 20-5-12.5 MG PO TABS: 1.0000 | ORAL_TABLET | Freq: Every day | ORAL | 2 refills | Status: AC

## 2023-08-12 MED ORDER — ATORVASTATIN CALCIUM 80 MG PO TABS: 80.0000 mg | ORAL_TABLET | Freq: Every day | ORAL | 2 refills | Status: AC

## 2023-08-12 MED ORDER — DILTIAZEM HCL ER COATED BEADS 120 MG PO CP24
ORAL_CAPSULE | ORAL | 2 refills | Status: AC
Start: 2023-08-12 — End: ?

## 2023-08-12 MED ORDER — TRAMADOL HCL 50 MG PO TABS: 50.0000 mg | ORAL_TABLET | Freq: Four times a day (QID) | ORAL | 0 refills | Status: AC | PRN
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
## 2023-08-13 LAB — HEMOGLOBIN A1C
Est. average glucose Bld gHb Est-mCnc: 117 mg/dL
Hgb A1c MFr Bld: 5.7 % — ABNORMAL HIGH (ref 4.8–5.6)

## 2023-08-13 LAB — CBC WITH DIFFERENTIAL/PLATELET
Basophils Absolute: 0 x10E3/uL (ref 0.0–0.2)
Basos: 0 %
EOS (ABSOLUTE): 0 x10E3/uL (ref 0.0–0.4)
Eos: 0 %
Hematocrit: 37.8 % (ref 34.0–46.6)
Hemoglobin: 12.1 g/dL (ref 11.1–15.9)
Immature Grans (Abs): 0 x10E3/uL (ref 0.0–0.1)
Immature Granulocytes: 0 %
Lymphocytes Absolute: 1.6 x10E3/uL (ref 0.7–3.1)
Lymphs: 21 %
MCH: 29.8 pg (ref 26.6–33.0)
MCHC: 32 g/dL (ref 31.5–35.7)
MCV: 93 fL (ref 79–97)
Monocytes Absolute: 0.7 x10E3/uL (ref 0.1–0.9)
Monocytes: 10 %
Neutrophils Absolute: 5.4 x10E3/uL (ref 1.4–7.0)
Neutrophils: 69 %
Platelets: 323 x10E3/uL (ref 150–450)
RBC: 4.06 x10E6/uL (ref 3.77–5.28)
RDW: 13.4 % (ref 11.7–15.4)
WBC: 7.7 x10E3/uL (ref 3.4–10.8)

## 2023-08-13 LAB — BMP8+EGFR
BUN/Creatinine Ratio: 16 (ref 12–28)
BUN: 13 mg/dL (ref 8–27)
CO2: 20 mmol/L (ref 20–29)
Calcium: 9.7 mg/dL (ref 8.7–10.3)
Chloride: 102 mmol/L (ref 96–106)
Creatinine, Ser: 0.82 mg/dL (ref 0.57–1.00)
Glucose: 97 mg/dL (ref 70–99)
Potassium: 4.6 mmol/L (ref 3.5–5.2)
Sodium: 140 mmol/L (ref 134–144)
eGFR: 80 mL/min/1.73 (ref >59)

## 2023-08-18 ENCOUNTER — Inpatient Hospital Stay: Payer: Self-pay | Admitting: Internal Medicine

## 2023-08-18 DIAGNOSIS — M542 Cervicalgia: Secondary | ICD-10-CM | POA: Insufficient documentation

## 2023-08-18 DIAGNOSIS — M25531 Pain in right wrist: Secondary | ICD-10-CM | POA: Insufficient documentation

## 2023-08-18 NOTE — Assessment & Plan Note (Signed)
 Elevated A1c indicating poor glycemic control.Discussed comprehensive diabetes management beyond sugar intake, including insulin resistance and pancreatic function. - Order A1c test and reorder Mounjaro  injection.

## 2023-08-18 NOTE — Assessment & Plan Note (Signed)
 On eliquis  5 mg BID daily

## 2023-08-18 NOTE — Assessment & Plan Note (Signed)
 On Eliquis BID d/t PAF

## 2023-08-19 ENCOUNTER — Ambulatory Visit: Payer: Self-pay | Admitting: Family Medicine

## 2023-08-19 NOTE — Progress Notes (Signed)
 A1c is 5.7 ,this is great. Goal is A1c of 7 or less. Continue with your current treatment. All your other labs are normal.  Thank you!

## 2023-08-25 ENCOUNTER — Ambulatory Visit: Admitting: Internal Medicine

## 2023-09-05 ENCOUNTER — Other Ambulatory Visit (HOSPITAL_COMMUNITY): Payer: Self-pay | Admitting: Radiology

## 2023-09-05 ENCOUNTER — Other Ambulatory Visit: Payer: Self-pay | Admitting: Internal Medicine

## 2023-09-05 DIAGNOSIS — E1169 Type 2 diabetes mellitus with other specified complication: Secondary | ICD-10-CM

## 2023-09-05 DIAGNOSIS — I119 Hypertensive heart disease without heart failure: Secondary | ICD-10-CM

## 2023-09-05 MED ORDER — TICAGRELOR 90 MG PO TABS: 90.0000 mg | ORAL_TABLET | Freq: Two times a day (BID) | ORAL | 3 refills | Status: DC

## 2023-09-06 ENCOUNTER — Other Ambulatory Visit: Payer: Self-pay

## 2023-09-06 DIAGNOSIS — E1165 Type 2 diabetes mellitus with hyperglycemia: Secondary | ICD-10-CM

## 2023-09-06 MED ORDER — MOUNJARO 7.5 MG/0.5ML ~~LOC~~ SOAJ: 7.5000 mg | SUBCUTANEOUS | 3 refills | Status: DC

## 2023-09-20 ENCOUNTER — Telehealth (HOSPITAL_COMMUNITY): Payer: Self-pay

## 2023-09-20 ENCOUNTER — Other Ambulatory Visit (HOSPITAL_COMMUNITY): Payer: Self-pay | Admitting: Radiology

## 2023-09-20 DIAGNOSIS — I609 Nontraumatic subarachnoid hemorrhage, unspecified: Secondary | ICD-10-CM

## 2023-09-20 DIAGNOSIS — I6522 Occlusion and stenosis of left carotid artery: Secondary | ICD-10-CM

## 2023-09-20 DIAGNOSIS — I639 Cerebral infarction, unspecified: Secondary | ICD-10-CM

## 2023-09-20 NOTE — Telephone Encounter (Signed)
 Pt would like to continue f/u's with Dr. Lester. Will get a referral put in for her to be seen. AB

## 2023-09-22 ENCOUNTER — Encounter: Payer: Self-pay | Admitting: Neuroradiology

## 2023-09-22 ENCOUNTER — Ambulatory Visit: Admitting: Neuroradiology

## 2023-09-22 VITALS — BP 130/81 | HR 78 | Ht 61.0 in | Wt 177.0 lb

## 2023-09-22 DIAGNOSIS — Z95828 Presence of other vascular implants and grafts: Secondary | ICD-10-CM

## 2023-09-22 DIAGNOSIS — I6523 Occlusion and stenosis of bilateral carotid arteries: Secondary | ICD-10-CM

## 2023-09-22 DIAGNOSIS — I6521 Occlusion and stenosis of right carotid artery: Secondary | ICD-10-CM

## 2023-09-22 NOTE — Progress Notes (Signed)
 I had the pleasure of seeing [name] in the office today for follow-up of cerebrovascular disease.  Briefly, she had a stroke episode with left sided weakness in 2021.  A MRI showed a subinsular infarct.  At that time her CT arteriogram showed a 75% stenosis of the left carotid and complete occlusion of the right carotid.  She had a subsequent CTA on 03/06/2020 which also showed complete occlusion of the right internal carotid artery.  A stent was placed in the left ICA by Dr. Monna 11/06/2020.  Interestingly, she had a carotid ultrasound on 09/08/2022 which showed no restenosis of the stent, but also showed normal velocities on the right side in both the common and internal carotid.  I have asked her detailed questions about stroke, TIA and amaurosis fugax, and she has not had any additional symptoms.  She is on Eliquis  for atrial fibrillation and has been on Brilinta  since the stent placement 3 years ago.  Assessment:  1.  Chronic occlusion of the right internal carotid artery.  I think the ultrasound is an error as the occlusion was present on multiple previous studies. 2.  Patent left carotid stent without restenosis  Recommendation:  She may stop the Brilinta .  I suggested 81 mg aspirin  given her history of atherosclerotic vascular disease.  I am going to check a CTA of the neck just to confirm that the right carotid is completely occluded given the recent ultrasound.

## 2023-09-26 DIAGNOSIS — M1811 Unilateral primary osteoarthritis of first carpometacarpal joint, right hand: Secondary | ICD-10-CM | POA: Insufficient documentation

## 2023-10-04 DIAGNOSIS — M542 Cervicalgia: Secondary | ICD-10-CM | POA: Insufficient documentation

## 2023-10-10 ENCOUNTER — Telehealth: Payer: Self-pay

## 2023-10-10 NOTE — Telephone Encounter (Signed)
 Called pt and advised her to call [facility] to schedule CT Angio.SABRASABRA

## 2023-10-11 DIAGNOSIS — M6281 Muscle weakness (generalized): Secondary | ICD-10-CM | POA: Insufficient documentation

## 2023-10-11 DIAGNOSIS — M47812 Spondylosis without myelopathy or radiculopathy, cervical region: Secondary | ICD-10-CM | POA: Insufficient documentation

## 2023-10-14 ENCOUNTER — Ambulatory Visit
Admission: RE | Admit: 2023-10-14 | Discharge: 2023-10-14 | Disposition: A | Discharge status: Home / Self Care | Source: Ambulatory Visit | Attending: Neuroradiology | Admitting: Neuroradiology

## 2023-10-14 DIAGNOSIS — I6523 Occlusion and stenosis of bilateral carotid arteries: Secondary | ICD-10-CM

## 2023-10-14 MED ORDER — IOPAMIDOL (ISOVUE-370) INJECTION 76%
80.0000 mL | Freq: Once | INTRAVENOUS | Status: AC | PRN
  Administered 2023-10-14: 80 mL via INTRAVENOUS

## 2023-10-25 ENCOUNTER — Encounter: Payer: Self-pay | Admitting: Neurology

## 2023-10-25 ENCOUNTER — Ambulatory Visit: Payer: 59 | Admitting: Neurology

## 2023-10-25 VITALS — BP 116/75 | HR 89 | Ht 61.0 in | Wt 174.0 lb

## 2023-10-25 DIAGNOSIS — G43709 Chronic migraine without aura, not intractable, without status migrainosus: Secondary | ICD-10-CM

## 2023-10-25 DIAGNOSIS — I6523 Occlusion and stenosis of bilateral carotid arteries: Secondary | ICD-10-CM

## 2023-10-25 DIAGNOSIS — I639 Cerebral infarction, unspecified: Secondary | ICD-10-CM

## 2023-10-25 MED ORDER — TOPIRAMATE 100 MG PO TABS: 100.0000 mg | ORAL_TABLET | Freq: Every day | ORAL | 1 refills | Status: AC

## 2023-10-25 NOTE — Patient Instructions (Signed)
 Continue follow up with Dr. Lester and Primary Care Strict management of vascular risk factors with a goal BP less than 130/90, A1c less than 7.0, LDL less than 70 for secondary stroke prevention Can continue Topamax , consider reducing to 50 mg at bedtime and stopping if headaches remain very controlled Return here as needed. Thanks
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
## 2023-11-02 ENCOUNTER — Telehealth: Payer: Self-pay | Admitting: Neuroradiology

## 2023-11-02 ENCOUNTER — Other Ambulatory Visit: Payer: Self-pay | Admitting: Neuroradiology

## 2023-11-02 DIAGNOSIS — I6522 Occlusion and stenosis of left carotid artery: Secondary | ICD-10-CM

## 2023-11-02 NOTE — Telephone Encounter (Signed)
 She had a left carotid stent placed 11/06/2020. She has a chronic right internal carotid artery occlusion. An ultrasound on 09/08/2022 suggested that the right carotid was open.  I repeated the CTA to make sure there had not been any change, specifically that the right carotid had not recanalized. The CTA shows the right carotid artery is completely occluded.  There is only mild restenosis of the stent on the left.  I suggest an ultrasound in 1 year to monitor the left carotid stent and we will arrange for that.  I communicated all of the above to [facility].

## 2023-11-08 ENCOUNTER — Other Ambulatory Visit: Payer: Self-pay | Admitting: Internal Medicine

## 2023-11-08 NOTE — Telephone Encounter (Unsigned)
 Copied from CRM [id number]. Topic: Clinical - Medication Refill >> Nov 08, 2023  4:50 PM China J wrote: Medication: fluticasone  (FLONASE ) 50 MCG/ACT nasal spray  Has the patient contacted their pharmacy? Yes (Agent: If no, request that the patient contact the pharmacy for the refill. If patient does not wish to contact the pharmacy document the reason why and proceed with request.) (Agent: If yes, when and what did the pharmacy advise?) Pharmacy could not refill the medication due to the script expiring.  This is the patient's preferred pharmacy:  [id number] - [place], [place] - [address] AT [facility] [address] [place] [address] Phone: [phone] Fax: [phone]  Is this the correct pharmacy for this prescription? Yes If no, delete pharmacy and type the correct one.   Has the prescription been filled recently? No  Is the patient out of the medication? Yes  Has the patient been seen for an appointment in the last year OR does the patient have an upcoming appointment? Yes  Can we respond through MyChart? Yes  Agent: Please be advised that Rx refills may take up to 3 business days. We ask that you follow-up with your pharmacy.

## 2023-11-09 MED ORDER — FLUTICASONE PROPIONATE 50 MCG/ACT NA SUSP: NASAL | 8 refills | Status: AC

## 2023-11-14 ENCOUNTER — Encounter: Payer: Self-pay | Admitting: Radiology

## 2023-12-20 ENCOUNTER — Encounter: Payer: Self-pay | Admitting: Internal Medicine

## 2023-12-20 ENCOUNTER — Ambulatory Visit: Payer: 59 | Admitting: Internal Medicine

## 2023-12-20 VITALS — BP 124/78 | HR 72 | Temp 98.9°F | Ht 61.0 in | Wt 179.2 lb

## 2023-12-20 DIAGNOSIS — E66811 Obesity, class 1: Secondary | ICD-10-CM | POA: Diagnosis not present

## 2023-12-20 DIAGNOSIS — E1165 Type 2 diabetes mellitus with hyperglycemia: Secondary | ICD-10-CM | POA: Diagnosis not present

## 2023-12-20 DIAGNOSIS — Z0000 Encounter for general adult medical examination without abnormal findings: Secondary | ICD-10-CM | POA: Insufficient documentation

## 2023-12-20 DIAGNOSIS — E1169 Type 2 diabetes mellitus with other specified complication: Secondary | ICD-10-CM | POA: Diagnosis not present

## 2023-12-20 DIAGNOSIS — E6609 Other obesity due to excess calories: Secondary | ICD-10-CM | POA: Diagnosis not present

## 2023-12-20 DIAGNOSIS — I119 Hypertensive heart disease without heart failure: Secondary | ICD-10-CM

## 2023-12-20 DIAGNOSIS — I480 Paroxysmal atrial fibrillation: Secondary | ICD-10-CM | POA: Diagnosis not present

## 2023-12-20 DIAGNOSIS — Z6833 Body mass index (BMI) 33.0-33.9, adult: Secondary | ICD-10-CM | POA: Diagnosis not present

## 2023-12-20 DIAGNOSIS — E785 Hyperlipidemia, unspecified: Secondary | ICD-10-CM

## 2023-12-20 DIAGNOSIS — I6523 Occlusion and stenosis of bilateral carotid arteries: Secondary | ICD-10-CM

## 2023-12-20 LAB — POCT URINALYSIS DIPSTICK
Bilirubin, UA: NEGATIVE
Glucose, UA: NEGATIVE
Ketones, UA: NEGATIVE
Nitrite, UA: NEGATIVE
Protein, UA: NEGATIVE
Spec Grav, UA: 1.015 (ref 1.010–1.025)
Urobilinogen, UA: 0.2 U/dL
pH, UA: 7 (ref 5.0–8.0)

## 2023-12-20 NOTE — Patient Instructions (Signed)

## 2023-12-20 NOTE — Assessment & Plan Note (Signed)
 Chronic, well controlled. EKG performed, NSR w/

## 2023-12-20 NOTE — Assessment & Plan Note (Signed)

## 2023-12-20 NOTE — Progress Notes (Unsigned)
 I,[name], CMA,acting as a neurosurgeon for [name], MD.,have documented all relevant documentation on the behalf of [name], MD,as directed by  [name], MD while in the presence of [name], MD.  Subjective:    Patient ID: [name] , female    DOB: April 28, 1960 , 63 y.o.   MRN: [id number]  Chief Complaint  Patient presents with   Annual Exam    Patient presents today for annual physical. Patient has been compliant with all medications. Patient denies any SOB, chest pain or headaches at this time. Patient has no other questions or concerns today. Patient states she will call to make mammogram appt today.     HPI  Hypertension This is a chronic problem. The current episode started more than 1 year ago. The problem has been gradually improving since onset. The problem is controlled. Pertinent negatives include no blurred vision, chest pain, headaches, palpitations or shortness of breath. Risk factors for coronary artery disease include obesity, post-menopausal state, sedentary lifestyle and stress. Past treatments include angiotensin blockers, diuretics and beta blockers. The current treatment provides moderate improvement.  Diabetes She presents for her follow-up diabetic visit. She has type 2 diabetes mellitus. Her disease course has been stable. Pertinent negatives for hypoglycemia include no headaches. Pertinent negatives for diabetes include no blurred vision and no chest pain. There are no hypoglycemic complications. Risk factors for coronary artery disease include diabetes mellitus, dyslipidemia, hypertension and obesity. She is following a diabetic diet. She participates in exercise three times a week. An ACE inhibitor/angiotensin II receptor blocker is being taken.     Past Medical History:  Diagnosis Date   Allergy    Arthritis    back    Atrial fibrillation (HCC)    Atypical chest pain 06/30/2016   Back pain    DUE TO INJURY AT WORK  ON05/2013   Bruises easily    Chronic lower back pain    Coronary artery disease    Headache(784.0)    rarely   History of bronchitis    last time about 70yrs ago    Hypertension    takes Benicar  daily   OSA (obstructive sleep apnea) 04/28/2020   Preoperative evaluation of a medical condition to rule out surgical contraindications (TAR required) 09/12/2020   Pure hypercholesterolemia 05/31/2019   Stroke (HCC)    no residual   Vitamin D  deficiency    takes Vitamin D  2 times/wk   Weakness    and numbness right foot     Family History  Problem Relation Age of Onset   Heart disease Mother    CAD Mother    Stroke Mother    Heart disease Sister    Heart attack Sister    Liver disease Brother    Other Father        unknown medical history   CAD Brother    Colon cancer Neg Hx    Esophageal cancer Neg Hx    Rectal cancer Neg Hx    Stomach cancer Neg Hx      Current Outpatient Medications:    atorvastatin  (LIPITOR ) 80 MG tablet, Take 1 tablet (80 mg total) by mouth daily., Disp: 90 tablet, Rfl: 2   benzonatate  (TESSALON  PERLES) 100 MG capsule, Take 1 capsule (100 mg total) by mouth 3 (three) times daily as needed for cough., Disp: 30 capsule, Rfl: 1   cholecalciferol (VITAMIN D3) 25 MCG (1000 UNIT) tablet, Take 1,000 Units by mouth daily. ,  Disp: , Rfl:    diltiazem  (CARDIZEM  CD) 120 MG 24 hr capsule, TAKE 1 CAPSULE BY MOUTH EVERY DAY, Disp: 90 capsule, Rfl: 2   ELIQUIS  5 MG TABS tablet, TAKE 1 TABLET BY MOUTH TWICE A DAY, Disp: 60 tablet, Rfl: 5   ezetimibe  (ZETIA ) 10 MG tablet, Take 1 tablet (10 mg total) by mouth daily., Disp: 90 tablet, Rfl: 2   fluticasone  (FLONASE ) 50 MCG/ACT nasal spray, PLACE 2 SPRAYS IN BOTH NOSTRILS DAILY AS NEEDED FOR ALLERGIES OR RHINITIS, Disp: 16 mL, Rfl: 8   levocetirizine (XYZAL ) 5 MG tablet, Take 1 tablet (5 mg total) by mouth daily., Disp: 90 tablet, Rfl: 2   Magnesium  250 MG TABS, TAKE 1 TABLET BY MOUTH DAILY  WITH EVENING MEALS, Disp: 90 tablet, Rfl: 2   methocarbamol  (ROBAXIN ) 500 MG tablet, Take 1 tablet as needed by oral route at bedtime for 30 days., Disp: , Rfl:    Olmesartan -amLODIPine -HCTZ 20-5-12.5 MG TABS, Take 1 tablet by mouth daily., Disp: 90 tablet, Rfl: 2   tirzepatide  (MOUNJARO ) 7.5 MG/0.5ML Pen, Inject 7.5 mg into the skin once a week., Disp: 2 mL, Rfl: 3   topiramate  (TOPAMAX ) 100 MG tablet, Take 1 tablet (100 mg total) by mouth at bedtime., Disp: 90 tablet, Rfl: 1  Current Facility-Administered Medications:    0.9 %  sodium chloride  infusion, 500 mL, Intravenous, Once, Nandigam, Kavitha V, MD   Allergies  Allergen Reactions   Other Itching and Other (See Comments)    Patient experienced intense itching in her arm that was accessed for a past angiogram      The patient states she uses none for birth control. Patient's last menstrual period was 06/03/2011.. Negative for Dysmenorrhea. Negative for: breast discharge, breast lump(s), breast pain and breast self exam. Associated symptoms include abnormal vaginal bleeding. Pertinent negatives include abnormal bleeding (hematology), anxiety, decreased libido, depression, difficulty falling sleep, dyspareunia, history of infertility, nocturia, sexual dysfunction, sleep disturbances, urinary incontinence, urinary urgency, vaginal discharge and vaginal itching. Diet regular.The patient states her exercise level is  intermittent, has recent injury which affects her ability to exercise.   . The patient's tobacco use is:  Social History   Tobacco Use  Smoking Status Never  Smokeless Tobacco Never  . She has been exposed to passive smoke. The patient's alcohol use is:  Social History   Substance and Sexual Activity  Alcohol Use Not Currently   Alcohol/week: 2.0 standard drinks of alcohol   Types: 2 Glasses of wine per week   Comment: social- 2 wine or 2 beers     Review of Systems  Constitutional: Negative.   HENT: Negative.     Eyes: Negative.  Negative for blurred vision.  Respiratory: Negative.  Negative for shortness of breath.   Cardiovascular: Negative.  Negative for chest pain and palpitations.  Gastrointestinal: Negative.   Endocrine: Negative.   Genitourinary:  Positive for difficulty urinating.  Musculoskeletal: Negative.   Skin: Negative.   Allergic/Immunologic: Negative.   Neurological: Negative.  Negative for headaches.  Hematological: Negative.   Psychiatric/Behavioral: Negative.       Today's Vitals   12/20/23 1014  BP: 124/78  Pulse: 72  Temp: 98.9 F (37.2 C)  SpO2: 98%  Weight: 179 lb 3.2 oz (81.3 kg)  Height: 5' 1 (1.549 m)   Body mass index is 33.86 kg/m.  Wt Readings from Last 3 Encounters:  12/20/23 179 lb 3.2 oz (81.3 kg)  10/25/23 174 lb (78.9 kg)  09/22/23 177 lb (80.3  kg)     Objective:  Physical Exam      Assessment And Plan:     Annual physical exam Assessment & Plan: A full exam was performed.  Importance of monthly self breast exams was discussed with the patient.  She is advised to get 30-45 minutes of regular exercise, no less than four to five days per week. Both weight-bearing and aerobic exercises are recommended.  She is advised to follow a healthy diet with at least six fruits/veggies per day, decrease intake of red meat and other saturated fats and to increase fish intake to twice weekly.  Meats/fish should not be fried -- baked, boiled or broiled is preferable. It is also important to cut back on your sugar intake.  Be sure to read labels - try to avoid anything with added sugar, high fructose corn syrup or other sweeteners.  If you must use a sweetener, you can try stevia or monkfruit.  It is also important to avoid artificially sweetened foods/beverages and diet drinks. Lastly, wear SPF 50 sunscreen on exposed skin and when in direct sunlight for an extended period of time.  Be sure to avoid fast food restaurants and aim for at least 60 ounces of water  daily.       Hypertensive heart disease without heart failure Assessment & Plan: Chronic, well controlled. EKG performed, NSR w/   Paroxysmal atrial fibrillation (HCC)  Bilateral carotid artery stenosis  Dyslipidemia associated with type 2 diabetes mellitus [facility])   Assessment & Plan Type 2 diabetes mellitus Diagnosed in December 2024 with A1c of 6.5%. Managed with Mounjaro , resulting in improved A1c. No hyperglycemia symptoms. - Suggested obtaining a blood glucose meter for home monitoring. - Scheduled diabetes check in four months.  Hypertensive heart disease without heart failure Managed with olmesartan , amlodipine , and hydrochlorothiazide . No heart failure symptoms.  Obesity BMI 33, weight 179 lbs. Reports fluid retention, possibly due to dietary sodium. No desire for further weight loss. - Continue current weight management plan.  General Health Maintenance Routine physical examination conducted. No acute concerns. - Scheduled next annual physical examination. Return for 1 year HM, 4 month dm f/u.[name] Patient was given opportunity to ask questions. Patient verbalized understanding of the plan and was able to repeat key elements of the plan. All questions were answered to their satisfaction.   I, [name], MD, have reviewed all documentation for this visit. The documentation on 12/20/23 for the exam, diagnosis, procedures, and orders are all accurate and complete.

## 2023-12-21 LAB — CMP14+EGFR
ALT: 5 IU/L (ref 0–32)
AST: 19 IU/L (ref 0–40)
Albumin: 4.5 g/dL (ref 3.9–4.9)
Alkaline Phosphatase: 72 IU/L (ref 49–135)
BUN/Creatinine Ratio: 12 (ref 12–28)
BUN: 11 mg/dL (ref 8–27)
Bilirubin Total: 1.1 mg/dL (ref 0.0–1.2)
CO2: 22 mmol/L (ref 20–29)
Calcium: 9.8 mg/dL (ref 8.7–10.3)
Chloride: 104 mmol/L (ref 96–106)
Creatinine, Ser: 0.91 mg/dL (ref 0.57–1.00)
Globulin, Total: 2.8 g/dL (ref 1.5–4.5)
Glucose: 92 mg/dL (ref 70–99)
Potassium: 3.9 mmol/L (ref 3.5–5.2)
Sodium: 141 mmol/L (ref 134–144)
Total Protein: 7.3 g/dL (ref 6.0–8.5)
eGFR: 71 mL/min/1.73 (ref >59)

## 2023-12-21 LAB — LIPID PANEL
Chol/HDL Ratio: 2.2 ratio (ref 0.0–4.4)
Cholesterol, Total: 143 mg/dL (ref 100–199)
HDL: 66 mg/dL (ref >39)
LDL Chol Calc (NIH): 64 mg/dL (ref 0–99)
Triglycerides: 64 mg/dL (ref 0–149)
VLDL Cholesterol Cal: 13 mg/dL (ref 5–40)

## 2023-12-21 LAB — MICROALBUMIN / CREATININE URINE RATIO
Creatinine, Urine: 13.2 mg/dL
Microalb/Creat Ratio: <23 mg/g{creat} (ref 0–29)
Microalbumin, Urine: <3 ug/mL

## 2023-12-21 LAB — HEMOGLOBIN A1C
Est. average glucose Bld gHb Est-mCnc: 120 mg/dL
Hgb A1c MFr Bld: 5.8 % — ABNORMAL HIGH (ref 4.8–5.6)

## 2023-12-24 ENCOUNTER — Ambulatory Visit: Payer: Self-pay | Admitting: Internal Medicine

## 2024-01-01 DIAGNOSIS — E6609 Other obesity due to excess calories: Secondary | ICD-10-CM | POA: Insufficient documentation

## 2024-01-01 NOTE — Assessment & Plan Note (Addendum)
 Chronic, LDL goal is less than 55.  She will continue with Eliquis  5mg  twice daily, ezetimibe  10mg  daily and atorvastatin  80mg  daily.  - Follow heart healthy lifestyle.  - Close follow up with Vascular.

## 2024-01-01 NOTE — Assessment & Plan Note (Signed)
 On eliquis  5 mg BID daily and diltiazem  for rate control.

## 2024-01-01 NOTE — Assessment & Plan Note (Signed)
 She is encouraged to strive for BMI less than 30 to decrease cardiac risk. Advised to aim for at least 150 minutes of exercise per week.

## 2024-01-01 NOTE — Assessment & Plan Note (Signed)
 Chronic, diabetic foot exam was performed.  Diagnosed in December 2024 with A1c of 6.5%. Managed with Mounjaro , resulting in improved A1c. No hyperglycemia symptoms. - Suggested obtaining a blood glucose meter for home monitoring. - Scheduled diabetes check in four months. - LDL goal is less than 55. - I DISCUSSED WITH THE PATIENT AT LENGTH REGARDING THE GOALS OF GLYCEMIC CONTROL AND POSSIBLE LONG-TERM COMPLICATIONS.  I  ALSO STRESSED THE IMPORTANCE OF COMPLIANCE WITH HOME GLUCOSE MONITORING, DIETARY RESTRICTIONS INCLUDING AVOIDANCE OF SUGARY DRINKS/PROCESSED FOODS,  ALONG WITH REGULAR EXERCISE.  I  ALSO STRESSED THE IMPORTANCE OF ANNUAL EYE EXAMS, SELF FOOT CARE AND COMPLIANCE WITH OFFICE VISITS.

## 2024-01-04 ENCOUNTER — Other Ambulatory Visit: Payer: Self-pay | Admitting: Family Medicine

## 2024-01-04 DIAGNOSIS — E1165 Type 2 diabetes mellitus with hyperglycemia: Secondary | ICD-10-CM

## 2024-01-16 ENCOUNTER — Telehealth (HOSPITAL_BASED_OUTPATIENT_CLINIC_OR_DEPARTMENT_OTHER): Payer: Self-pay | Admitting: *Deleted

## 2024-01-16 NOTE — Telephone Encounter (Signed)
 Pharmacy please advise on holding Eliquis  for 3 days prior to  LEFT C5-C6 AND C6-C7 INTRA-ARTICULAR FACET JOINT INJECTION  scheduled for TBD. Thank you.

## 2024-01-16 NOTE — Telephone Encounter (Signed)
 Pt requesting a c/b to schedule her tele visit

## 2024-01-16 NOTE — Telephone Encounter (Signed)
 Left message to call back to schedule tele pre op appt.

## 2024-01-16 NOTE — Telephone Encounter (Signed)
"  ° °  Pre-operative Risk Assessment    Patient Name: [name]  DOB: [birth year] MRN: [id number]   Date of last office visit: 04/18/23 ROSALINE BANE, NP Date of next office visit: NONE   Request for Surgical Clearance    Procedure:  LEFT C5-C6 AND C6-C7 INTRA-ARTICULAR FACET JOINT INJECTION  Date of Surgery:  Clearance TBD                                Surgeon:  DR. LAQUETA Surgeon's Group or Practice Name:  [facility] Phone number:  [phone] MELISSA B. EXT [phone] Fax number:  [phone]   Type of Clearance Requested:   - Medical  - Pharmacy:  Hold Apixaban  (Eliquis ) x 3 DAYS PRIOR   Type of Anesthesia:  Not Indicated   Additional requests/questions:    [name]   01/16/2024, 3:20 PM   "

## 2024-01-16 NOTE — Telephone Encounter (Signed)
 I called the pt right back and left vm to call our office back to schedule a tele preop appt.

## 2024-01-16 NOTE — Telephone Encounter (Signed)
" ° °  Name: [name]  DOB: 1960/09/16  MRN: [id number]  Primary Cardiologist: Annabella Scarce, MD   Preoperative team, please contact this patient and set up a phone call appointment for further preoperative risk assessment. Please obtain consent and complete medication review. Thank you for your help.  I confirm that guidance regarding antiplatelet and oral anticoagulation therapy has been completed and, if necessary, noted below.  Pharmacy recommendations requested for Xarelto hold.  I also confirmed the patient resides in the state of [place] . As per [facility] Medical Board telemedicine laws, the patient must reside in the state in which the provider is licensed.   Lamarr Satterfield, NP 01/16/2024, 3:39 PM [facility]    "

## 2024-01-17 ENCOUNTER — Telehealth: Payer: Self-pay

## 2024-01-17 NOTE — Telephone Encounter (Signed)
 Pt scheduled for VV on 1/12

## 2024-01-17 NOTE — Telephone Encounter (Signed)
"  °  Patient Consent for Virtual Visit        [name] has provided verbal consent on 01/17/2024 for a virtual visit (video or telephone).   CONSENT FOR VIRTUAL VISIT FOR:  [name]  By participating in this virtual visit I agree to the following:  I hereby voluntarily request, consent and authorize [facility] and its employed or contracted physicians, physician assistants, nurse practitioners or other licensed health care professionals (the Practitioner), to provide me with telemedicine health care services (the Services) as deemed necessary by the treating Practitioner. I acknowledge and consent to receive the Services by the Practitioner via telemedicine. I understand that the telemedicine visit will involve communicating with the Practitioner through live audiovisual communication technology and the disclosure of certain medical information by electronic transmission. I acknowledge that I have been given the opportunity to request an in-person assessment or other available alternative prior to the telemedicine visit and am voluntarily participating in the telemedicine visit.  I understand that I have the right to withhold or withdraw my consent to the use of telemedicine in the course of my care at any time, without affecting my right to future care or treatment, and that the Practitioner or I may terminate the telemedicine visit at any time. I understand that I have the right to inspect all information obtained and/or recorded in the course of the telemedicine visit and may receive copies of available information for a reasonable fee.  I understand that some of the potential risks of receiving the Services via telemedicine include:  Delay or interruption in medical evaluation due to technological equipment failure or disruption; Information transmitted may not be sufficient (e.g. poor resolution of images) to allow for appropriate medical decision making by the  Practitioner; and/or  In rare instances, security protocols could fail, causing a breach of personal health information.  Furthermore, I acknowledge that it is my responsibility to provide information about my medical history, conditions and care that is complete and accurate to the best of my ability. I acknowledge that Practitioner's advice, recommendations, and/or decision may be based on factors not within their control, such as incomplete or inaccurate data provided by me or distortions of diagnostic images or specimens that may result from electronic transmissions. I understand that the practice of medicine is not an exact science and that Practitioner makes no warranties or guarantees regarding treatment outcomes. I acknowledge that a copy of this consent can be made available to me via my patient portal [facility] MyChart), or I can request a printed copy by calling the office of [facility].    I understand that my insurance will be billed for this visit.   I have read or had this consent read to me. I understand the contents of this consent, which adequately explains the benefits and risks of the Services being provided via telemedicine.  I have been provided ample opportunity to ask questions regarding this consent and the Services and have had my questions answered to my satisfaction. I give my informed consent for the services to be provided through the use of telemedicine in my medical care    "

## 2024-01-18 ENCOUNTER — Encounter: Payer: Self-pay | Admitting: Internal Medicine

## 2024-01-18 ENCOUNTER — Ambulatory Visit: Admitting: Internal Medicine

## 2024-01-18 VITALS — BP 122/78 | HR 81 | Temp 98.3°F | Ht 61.0 in | Wt 172.8 lb

## 2024-01-18 DIAGNOSIS — R0982 Postnasal drip: Secondary | ICD-10-CM | POA: Diagnosis not present

## 2024-01-18 DIAGNOSIS — I480 Paroxysmal atrial fibrillation: Secondary | ICD-10-CM

## 2024-01-18 DIAGNOSIS — I119 Hypertensive heart disease without heart failure: Secondary | ICD-10-CM | POA: Diagnosis not present

## 2024-01-18 DIAGNOSIS — R051 Acute cough: Secondary | ICD-10-CM

## 2024-01-18 MED ORDER — BENZONATATE 100 MG PO CAPS: 100.0000 mg | ORAL_CAPSULE | Freq: Three times a day (TID) | ORAL | 1 refills | Status: AC | PRN

## 2024-01-18 NOTE — Progress Notes (Signed)
 I,[name], CMA,acting as a neurosurgeon for Rebekah LOISE Slocumb, MD.,have documented all relevant documentation on the behalf of Rebekah LOISE Slocumb, MD,as directed by  Rebekah LOISE Slocumb, MD while in the presence of Rebekah LOISE Slocumb, MD.  Subjective:  Patient ID: [name] , female    DOB: 12-25-1960 , 64 y.o.   MRN: [id number]  Chief Complaint  Patient presents with   Nasal Congestion    Patient presents today for runny nose & cough. She states the drainage triggers her cough. Denies fever / chills this has been an ongoing issue for 2 weeks.   Cough    HPI Discussed the use of AI scribe software for clinical note transcription with the patient, who gave verbal consent to proceed.  History of Present Illness [name] is a 64 year old female with high blood pressure and atrial fibrillation who presents with a two-week history of runny nose and cough.  She has been experiencing a runny nose and cough for the past two weeks. Symptoms have been intermittent, with some days showing improvement and others worsening. No fever, body aches, or general malaise. She has not sought medical attention for these symptoms until now.  She has been taking quercetin, which initially seemed to help, but the symptoms persist. She also uses Tessalon  Perles to calm her cough, which she finds effective. She has not been using Allegra or Zyrtec due to concerns about her blood pressure medication.  Her symptoms started right before the new year. She was around family during the holidays, including her son's in-laws from [place] and her grandchildren, aged three and six, who have been sick. However, she is not aware of anyone else being sick around her during that time.  She has a history of high blood pressure and atrial fibrillation, which influences her medication choices.      Past Medical History:  Diagnosis Date   Allergy    Arthritis    back    Atrial fibrillation (HCC)    Atypical chest  pain 06/30/2016   Back pain    DUE TO INJURY AT WORK ON05/2013   Bruises easily    Chronic lower back pain    Coronary artery disease    Headache(784.0)    rarely   History of bronchitis    last time about 41yrs ago    Hypertension    takes Benicar  daily   OSA (obstructive sleep apnea) 04/28/2020   Preoperative evaluation of a medical condition to rule out surgical contraindications (TAR required) 09/12/2020   Pure hypercholesterolemia 05/31/2019   Stroke (HCC)    no residual   Vitamin D  deficiency    takes Vitamin D  2 times/wk   Weakness    and numbness right foot     Family History  Problem Relation Age of Onset   Heart disease Mother    CAD Mother    Stroke Mother    Heart disease Sister    Heart attack Sister    Liver disease Brother    Other Father        unknown medical history   CAD Brother    Colon cancer Neg Hx    Esophageal cancer Neg Hx    Rectal cancer Neg Hx    Stomach cancer Neg Hx     Current Medications[1]   Allergies[2]   Review of Systems  Constitutional: Negative.   HENT:  Positive for rhinorrhea.   Respiratory:  Positive for cough.   Cardiovascular:  Negative.   Gastrointestinal: Negative.   Neurological: Negative.   Psychiatric/Behavioral: Negative.       Today's Vitals   01/18/24 1608  BP: 122/78  Pulse: 81  Temp: 98.3 F (36.8 C)  SpO2: 98%  Weight: 172 lb 12.8 oz (78.4 kg)  Height: 5' 1 (1.549 m)   Body mass index is 32.65 kg/m.  Wt Readings from Last 3 Encounters:  01/18/24 172 lb 12.8 oz (78.4 kg)  12/20/23 179 lb 3.2 oz (81.3 kg)  10/25/23 174 lb (78.9 kg)    The ASCVD Risk score (Arnett DK, et al., 2019) failed to calculate for the following reasons:   Risk score cannot be calculated because patient has a medical history suggesting prior/existing ASCVD   * - Cholesterol units were assumed  Objective:  Physical Exam Vitals and nursing note reviewed.  Constitutional:      Appearance: Normal appearance.  HENT:      Head: Normocephalic and atraumatic.     Right Ear: Tympanic membrane, ear canal and external ear normal. There is no impacted cerumen.     Left Ear: Tympanic membrane, ear canal and external ear normal. There is no impacted cerumen.  Eyes:     Extraocular Movements: Extraocular movements intact.  Cardiovascular:     Rate and Rhythm: Normal rate and regular rhythm.     Heart sounds: Normal heart sounds.  Pulmonary:     Effort: Pulmonary effort is normal.     Breath sounds: Normal breath sounds.  Musculoskeletal:     Cervical back: Normal range of motion.  Skin:    General: Skin is warm.  Neurological:     General: No focal deficit present.     Mental Status: She is alert.  Psychiatric:        Mood and Affect: Mood normal.        Behavior: Behavior normal.         Assessment And Plan:   Assessment & Plan Postnasal drip Symptoms likely allergy-related, exacerbated by warm weather. Coricidin provided some relief. - Provided Zyrtec samples or coupon for nighttime use. - Refilled Tessalon  Perles for cough. - Call office if symptoms persist.  Hypertensive heart disease without heart failure Chronic, well controlled. Managed with olmesartan , amlodipine , and hydrochlorothiazide . No heart failure symptoms.  Paroxysmal atrial fibrillation (HCC) On eliquis  5 mg BID daily and diltiazem  for rate control.  - reminded to avoid OTC decongestants. Acute cough Will refill Tessalon  perles.     No orders of the defined types were placed in this encounter.    Return if symptoms worsen or fail to improve.  Patient was given opportunity to ask questions. Patient verbalized understanding of the plan and was able to repeat key elements of the plan. All questions were answered to their satisfaction.    I, Rebekah LOISE Slocumb, MD, have reviewed all documentation for this visit. The documentation on 01/29/24 for the exam, diagnosis, procedures, and orders are all accurate and complete.   IF YOU  HAVE BEEN REFERRED TO A SPECIALIST, IT MAY TAKE 1-2 WEEKS TO SCHEDULE/PROCESS THE REFERRAL. IF YOU HAVE NOT HEARD FROM US /SPECIALIST IN TWO WEEKS, PLEASE GIVE US  A CALL AT [phone].       [1]  Current Outpatient Medications:    atorvastatin  (LIPITOR ) 80 MG tablet, Take 1 tablet (80 mg total) by mouth daily., Disp: 90 tablet, Rfl: 2   cholecalciferol (VITAMIN D3) 25 MCG (1000 UNIT) tablet, Take 1,000 Units by mouth daily. , Disp: ,  Rfl:    diltiazem  (CARDIZEM  CD) 120 MG 24 hr capsule, TAKE 1 CAPSULE BY MOUTH EVERY DAY, Disp: 90 capsule, Rfl: 2   ELIQUIS  5 MG TABS tablet, TAKE 1 TABLET BY MOUTH TWICE A DAY, Disp: 60 tablet, Rfl: 5   ezetimibe  (ZETIA ) 10 MG tablet, Take 1 tablet (10 mg total) by mouth daily., Disp: 90 tablet, Rfl: 2   fluticasone  (FLONASE ) 50 MCG/ACT nasal spray, PLACE 2 SPRAYS IN BOTH NOSTRILS DAILY AS NEEDED FOR ALLERGIES OR RHINITIS, Disp: 16 mL, Rfl: 8   levocetirizine (XYZAL ) 5 MG tablet, Take 1 tablet (5 mg total) by mouth daily., Disp: 90 tablet, Rfl: 2   Magnesium  250 MG TABS, TAKE 1 TABLET BY MOUTH DAILY WITH EVENING MEALS, Disp: 90 tablet, Rfl: 2   methocarbamol  (ROBAXIN ) 500 MG tablet, Take 1 tablet as needed by oral route at bedtime for 30 days., Disp: , Rfl:    Olmesartan -amLODIPine -HCTZ 20-5-12.5 MG TABS, Take 1 tablet by mouth daily., Disp: 90 tablet, Rfl: 2   tirzepatide  (MOUNJARO ) 7.5 MG/0.5ML Pen, INJECT 7.5 MG SUBCUTANEOUSLY WEEKLY, Disp: 2 mL, Rfl: 3   topiramate  (TOPAMAX ) 100 MG tablet, Take 1 tablet (100 mg total) by mouth at bedtime., Disp: 90 tablet, Rfl: 1   benzonatate  (TESSALON  PERLES) 100 MG capsule, Take 1 capsule (100 mg total) by mouth 3 (three) times daily as needed for cough., Disp: 30 capsule, Rfl: 1  Current Facility-Administered Medications:    0.9 %  sodium chloride  infusion, 500 mL, Intravenous, Once, Nandigam, Kavitha V, MD [2]  Allergies Allergen Reactions   Other Itching and Other (See Comments)    Patient experienced intense  itching in her arm that was accessed for a past angiogram

## 2024-01-18 NOTE — Patient Instructions (Signed)

## 2024-01-23 ENCOUNTER — Encounter: Payer: Self-pay | Admitting: Nurse Practitioner

## 2024-01-23 ENCOUNTER — Ambulatory Visit: Attending: Internal Medicine | Admitting: Nurse Practitioner

## 2024-01-23 DIAGNOSIS — Z01810 Encounter for preprocedural cardiovascular examination: Secondary | ICD-10-CM

## 2024-01-23 NOTE — Progress Notes (Signed)
 "   Virtual Visit via Telephone Note   Because of [name] co-morbid illnesses, she is at least at moderate risk for complications without adequate follow up.  This format is felt to be most appropriate for this patient at this time.  Due to technical limitations with video connection (technology), today's appointment will be conducted as an audio only telehealth visit, and [name] verbally agreed to proceed in this manner.   All issues noted in this document were discussed and addressed.  No physical exam could be performed with this format.  Evaluation Performed:  Preoperative cardiovascular risk assessment _____________   Date:  01/23/2024   Patient ID:  [name], DOB January 28, 1960, MRN [id number] Patient Location: Home Provider location:  Office  Primary Care Provider:  Jarold Medici, MD Primary Cardiologist:  Annabella Scarce, MD  Chief Complaint / Patient Profile   64 y.o. y/o female with a h/o CAD s/p DES to LCx 2020, PAF on Eliquis , hypertension, carotid artery stenosis, stroke, LVEF 60-65% with mild LVH and G1DD on echo 03/17/2020 who is pending left C5-C6 and C6-C7 intra-articular facet joint injection date TBD and presents today for telephonic preoperative cardiovascular risk assessment.  History of Present Illness    [name] is a 64 y.o. female who presents via audio/video conferencing for a telehealth visit today.  Pt was last seen in cardiology clinic on 04/18/2023 by Rosaline Bane, NP.  At that time [name] was doing well. She was increasing activity without anginal symptoms and had been taking Mounjaro  for weight loss. Her most recent carotid duplex 09/04/2022 showed 50-75% stenosis of the left ICA but recannulation of the previously occluded right ICA. The patient is now pending procedure as outlined above. Since her last visit, she denies chest pain, shortness of breath, orthopnea, PND, lower extremity edema, palpitations, lightheadedness,  dizziness, syncope. She recently was able to carry her Christmas tree from the house to the road without any concerning cardiac symptoms.   Past Medical History    Past Medical History:  Diagnosis Date   Allergy    Arthritis    back    Atrial fibrillation (HCC)    Atypical chest pain 06/30/2016   Back pain    DUE TO INJURY AT WORK ON05/2013   Bruises easily    Chronic lower back pain    Coronary artery disease    Headache(784.0)    rarely   History of bronchitis    last time about 42yrs ago    Hypertension    takes Benicar  daily   OSA (obstructive sleep apnea) 04/28/2020   Preoperative evaluation of a medical condition to rule out surgical contraindications (TAR required) 09/12/2020   Pure hypercholesterolemia 05/31/2019   Stroke (HCC)    no residual   Vitamin D  deficiency    takes Vitamin D  2 times/wk   Weakness    and numbness right foot   Past Surgical History:  Procedure Laterality Date   BACK SURGERY     CARPAL TUNNEL RELEASE Right    CESAREAN SECTION  1988   COLONOSCOPY     EXPLORATORY LAPAROTOMY  1991   took out fatty tumor (11/09/2012)   IR ANGIO INTRA EXTRACRAN SEL COM CAROTID INNOMINATE BILAT MOD SED  08/10/2019   IR ANGIO INTRA EXTRACRAN SEL COM CAROTID INNOMINATE BILAT MOD SED  10/07/2020   IR ANGIO INTRA EXTRACRAN SEL COM CAROTID INNOMINATE UNI L MOD SED  11/05/2020   IR ANGIO VERTEBRAL SEL SUBCLAVIAN  INNOMINATE BILAT MOD SED  10/07/2020   IR ANGIO VERTEBRAL SEL VERTEBRAL BILAT MOD SED  08/10/2019   IR CT HEAD LTD  11/05/2020   IR INTRAVSC STENT CERV CAROTID W/EMB-PROT MOD SED INCL ANGIO  11/05/2020   IR RADIOLOGIST EVAL & MGMT  11/21/2020   IR US  GUIDE VASC ACCESS RIGHT  08/10/2019   IR US  GUIDE VASC ACCESS RIGHT  10/07/2020   LEFT HEART CATH AND CORONARY ANGIOGRAPHY N/A 08/06/2016   Procedure: Left Heart Cath and Coronary Angiography;  Surgeon: Claudene Victory ORN, MD;  Location: [facility] INVASIVE CV LAB;  Service: Cardiovascular;  Laterality: N/A;   LEFT HEART CATH  AND CORONARY ANGIOGRAPHY N/A 07/12/2018   Procedure: LEFT HEART CATH AND CORONARY ANGIOGRAPHY;  Surgeon: Anner Alm ORN, MD;  Location: [facility] INVASIVE CV LAB;  Service: Cardiovascular;  Laterality: N/A;   LUMBAR LAMINECTOMY/DECOMPRESSION MICRODISCECTOMY  11/09/2012   L3-4 (11/09/2012)   LUMBAR LAMINECTOMY/DECOMPRESSION MICRODISCECTOMY N/A 11/09/2012   Procedure: L3-L4 DECOMPRESSION AND MICRODISCECTOMY   (1 LEVEL);  Surgeon: Donaciano Sprang, MD;  Location: [facility] OR;  Service: Orthopedics;  Laterality: N/A;   RADIOLOGY WITH ANESTHESIA N/A 11/05/2020   Procedure: STENTING;  Surgeon: Dolphus Carrion, MD;  Location: [facility] OR;  Service: Radiology;  Laterality: N/A;   TOTAL HIP ARTHROPLASTY Left 05/11/2021   Procedure: LEFT TOTAL HIP ARTHROPLASTY ANTERIOR APPROACH;  Surgeon: Jerri Kay HERO, MD;  Location: [facility] OR;  Service: Orthopedics;  Laterality: Left;  3-C    Allergies  Allergies[1]  Home Medications    Prior to Admission medications  Medication Sig Start Date End Date Taking? Authorizing Provider  atorvastatin  (LIPITOR ) 80 MG tablet Take 1 tablet (80 mg total) by mouth daily. 08/12/23   Petrina Pries, NP  benzonatate  (TESSALON  PERLES) 100 MG capsule Take 1 capsule (100 mg total) by mouth 3 (three) times daily as needed for cough. 01/18/24 01/17/25  Jarold Medici, MD  cholecalciferol (VITAMIN D3) 25 MCG (1000 UNIT) tablet Take 1,000 Units by mouth daily.     [provider]  diltiazem  (CARDIZEM  CD) 120 MG 24 hr capsule TAKE 1 CAPSULE BY MOUTH EVERY DAY 08/12/23   Petrina Pries, NP  ELIQUIS  5 MG TABS tablet TAKE 1 TABLET BY MOUTH TWICE A DAY 07/04/23   Raford Riggs, MD  ezetimibe  (ZETIA ) 10 MG tablet Take 1 tablet (10 mg total) by mouth daily. 08/12/23   Petrina Pries, NP  fluticasone  (FLONASE ) 50 MCG/ACT nasal spray PLACE 2 SPRAYS IN BOTH NOSTRILS DAILY AS NEEDED FOR ALLERGIES OR RHINITIS 11/09/23   Jarold Medici, MD  levocetirizine (XYZAL ) 5 MG tablet Take 1 tablet (5 mg total) by mouth daily. 04/19/23    Jarold Medici, MD  Magnesium  250 MG TABS TAKE 1 TABLET BY MOUTH DAILY WITH EVENING MEALS 10/13/20   Jarold Medici, MD  methocarbamol  (ROBAXIN ) 500 MG tablet Take 1 tablet as needed by oral route at bedtime for 30 days. 10/18/23   [provider]  Olmesartan -amLODIPine -HCTZ 20-5-12.5 MG TABS Take 1 tablet by mouth daily. 08/12/23   Petrina Pries, NP  tirzepatide  (MOUNJARO ) 7.5 MG/0.5ML Pen INJECT 7.5 MG SUBCUTANEOUSLY WEEKLY 01/04/24   Jarold Medici, MD  topiramate  (TOPAMAX ) 100 MG tablet Take 1 tablet (100 mg total) by mouth at bedtime. 10/25/23   Gayland Lauraine PARAS, NP    Physical Exam    Vital Signs:  Leonie JONELLE Keepers does not have vital signs available for review today.  Given telephonic nature of communication, physical exam is limited. AAOx3. NAD. Normal affect.  Speech and respirations  are unlabored.  Accessory Clinical Findings    None  Assessment & Plan    1.  Preoperative Cardiovascular Risk Assessment: According to the Revised Cardiac Risk Index (RCRI), her Perioperative Risk of Major Cardiac Event is (%): 6.6 Her Functional Capacity in METs is: 5.29 according to the Duke Activity Status Index (DASI). With prior history of CAD and stroke, her cardiovascular risk is higher, but she is medically optimized from a cardiac perspective. The patient was advised that if she develops new symptoms prior to surgery to contact our office to arrange for a follow-up visit, and she verbalized understanding.  Per the office protocol, Eliquis  can be held for 3 days prior to the procedure. Dr. Raford states that the patient does not need bridging. - pharmD  A copy of this note will be routed to requesting surgeon.  Time:  Today, I have spent 10 minutes with the patient with telehealth technology discussing medical history, symptoms, and management plan.     Saddie GORMAN Cleaves, NP 01/23/2024, 10:45 AM     [1]  Allergies Allergen Reactions   Other Itching and Other (See Comments)     Patient experienced intense itching in her arm that was accessed for a past angiogram   "

## 2024-01-23 NOTE — Telephone Encounter (Signed)
 Patient with diagnosis of afib on Eliquis  for anticoagulation.    Procedure:  LEFT C5-C6 AND C6-C7 INTRA-ARTICULAR FACET JOINT INJECTION  Date of procedure: TBD   CHA2DS2-VASc Score = 6   This indicates a 9.7% annual risk of stroke. The patient's score is based upon: CHF History: 0 HTN History: 1 Diabetes History: 1 Stroke History: 2 Vascular Disease History: 1 Age Score: 0 Gender Score: 1      CrCl 78 ml/min Platelet count 323  In 2022 and 2023 patient was bridged for hip surgeries. However, 2024 peri-operative CV management for noncardiac surgery recommends against bridging due to increased risk of bleeding.   Stroke of MCA July 2021.  It was also noted that patient had subdural hematoma on scan.  (08/07/19)   Patient has not had an Afib/aflutter ablation in the last 3 months, DCCV within the last 4 weeks or a watchman implanted in the last 45 days   Will need to discuss with Dr. Raford  **This guidance is not considered finalized until pre-operative APP has relayed final recommendations.**

## 2024-01-24 NOTE — Telephone Encounter (Signed)
 Per Dr. Raford, ok not to bridge

## 2024-01-29 NOTE — Assessment & Plan Note (Signed)
 Will refill Tessalon  perles.

## 2024-01-29 NOTE — Assessment & Plan Note (Signed)
 Chronic, well controlled. Managed with olmesartan , amlodipine , and hydrochlorothiazide . No heart failure symptoms.

## 2024-01-29 NOTE — Assessment & Plan Note (Signed)
 On eliquis  5 mg BID daily and diltiazem  for rate control.  - reminded to avoid OTC decongestants.

## 2024-04-23 ENCOUNTER — Ambulatory Visit: Admitting: Internal Medicine

## 2024-12-24 ENCOUNTER — Encounter: Payer: Self-pay | Admitting: Internal Medicine

## 2024-12-25 ENCOUNTER — Encounter: Admitting: Internal Medicine
